# Patient Record
Sex: Female | Born: 1935
Health system: Southern US, Community
[De-identification: ages and names within clinical notes are randomized; demographics above are authoritative.]

## PROBLEM LIST (undated history)

## (undated) DIAGNOSIS — D649 Anemia, unspecified: Secondary | ICD-10-CM

## (undated) DIAGNOSIS — I480 Paroxysmal atrial fibrillation: Secondary | ICD-10-CM

## (undated) DIAGNOSIS — Z8673 Personal history of transient ischemic attack (TIA), and cerebral infarction without residual deficits: Secondary | ICD-10-CM

## (undated) DIAGNOSIS — I5032 Chronic diastolic (congestive) heart failure: Secondary | ICD-10-CM

## (undated) DIAGNOSIS — I639 Cerebral infarction, unspecified: Secondary | ICD-10-CM

## (undated) DIAGNOSIS — E119 Type 2 diabetes mellitus without complications: Secondary | ICD-10-CM

## (undated) DIAGNOSIS — K649 Unspecified hemorrhoids: Secondary | ICD-10-CM

## (undated) DIAGNOSIS — I509 Heart failure, unspecified: Secondary | ICD-10-CM

## (undated) DIAGNOSIS — N183 Chronic kidney disease, stage 3 unspecified: Secondary | ICD-10-CM

## (undated) DIAGNOSIS — I10 Essential (primary) hypertension: Secondary | ICD-10-CM

## (undated) DIAGNOSIS — K219 Gastro-esophageal reflux disease without esophagitis: Secondary | ICD-10-CM

## (undated) DIAGNOSIS — M1990 Unspecified osteoarthritis, unspecified site: Secondary | ICD-10-CM

## (undated) DIAGNOSIS — I619 Nontraumatic intracerebral hemorrhage, unspecified: Secondary | ICD-10-CM

## (undated) DIAGNOSIS — N184 Chronic kidney disease, stage 4 (severe): Secondary | ICD-10-CM

## (undated) HISTORY — PX: JOINT REPLACEMENT: SHX530

## (undated) HISTORY — PX: EYE SURGERY: SHX253

## (undated) HISTORY — DX: Chronic diastolic (congestive) heart failure: I50.32

## (undated) HISTORY — DX: Cerebral infarction, unspecified: I63.9

## (undated) HISTORY — PX: BACK SURGERY: SHX140

## (undated) HISTORY — PX: CHOLECYSTECTOMY: SHX55

## (undated) HISTORY — DX: Unspecified hemorrhoids: K64.9

---

## 2001-03-04 ENCOUNTER — Emergency Department (HOSPITAL_COMMUNITY): Admission: EM | Admit: 2001-03-04 | Discharge: 2001-03-04 | Payer: Self-pay | Admitting: *Deleted

## 2001-03-28 ENCOUNTER — Emergency Department (HOSPITAL_COMMUNITY): Admission: EM | Admit: 2001-03-28 | Discharge: 2001-03-28 | Payer: Self-pay | Admitting: Emergency Medicine

## 2001-03-28 ENCOUNTER — Encounter: Payer: Self-pay | Admitting: Emergency Medicine

## 2001-06-30 ENCOUNTER — Ambulatory Visit (HOSPITAL_COMMUNITY): Admission: RE | Admit: 2001-06-30 | Discharge: 2001-06-30 | Payer: Self-pay | Admitting: Family Medicine

## 2001-06-30 ENCOUNTER — Encounter: Payer: Self-pay | Admitting: Family Medicine

## 2001-07-07 ENCOUNTER — Emergency Department (HOSPITAL_COMMUNITY): Admission: EM | Admit: 2001-07-07 | Discharge: 2001-07-07 | Payer: Self-pay | Admitting: Emergency Medicine

## 2001-07-07 ENCOUNTER — Encounter: Payer: Self-pay | Admitting: Emergency Medicine

## 2001-07-09 ENCOUNTER — Encounter: Payer: Self-pay | Admitting: Internal Medicine

## 2001-07-09 ENCOUNTER — Inpatient Hospital Stay (HOSPITAL_COMMUNITY): Admission: EM | Admit: 2001-07-09 | Discharge: 2001-07-13 | Payer: Self-pay | Admitting: Emergency Medicine

## 2001-07-09 ENCOUNTER — Encounter: Payer: Self-pay | Admitting: Emergency Medicine

## 2001-07-13 ENCOUNTER — Inpatient Hospital Stay (HOSPITAL_COMMUNITY)
Admission: RE | Admit: 2001-07-13 | Discharge: 2001-07-21 | Payer: Self-pay | Admitting: Physical Medicine & Rehabilitation

## 2001-07-27 ENCOUNTER — Encounter (HOSPITAL_COMMUNITY)
Admission: RE | Admit: 2001-07-27 | Discharge: 2001-08-26 | Payer: Self-pay | Admitting: Physical Medicine & Rehabilitation

## 2001-08-30 ENCOUNTER — Encounter (HOSPITAL_COMMUNITY)
Admission: RE | Admit: 2001-08-30 | Discharge: 2001-09-29 | Payer: Self-pay | Admitting: Physical Medicine & Rehabilitation

## 2001-09-22 ENCOUNTER — Inpatient Hospital Stay (HOSPITAL_COMMUNITY): Admission: EM | Admit: 2001-09-22 | Discharge: 2001-09-24 | Payer: Self-pay | Admitting: Internal Medicine

## 2001-09-22 ENCOUNTER — Encounter: Payer: Self-pay | Admitting: Internal Medicine

## 2001-12-28 ENCOUNTER — Inpatient Hospital Stay (HOSPITAL_COMMUNITY): Admission: RE | Admit: 2001-12-28 | Discharge: 2001-12-29 | Payer: Self-pay | Admitting: Internal Medicine

## 2001-12-28 HISTORY — PX: ESOPHAGOGASTRODUODENOSCOPY: SHX1529

## 2002-04-13 ENCOUNTER — Emergency Department (HOSPITAL_COMMUNITY): Admission: EM | Admit: 2002-04-13 | Discharge: 2002-04-13 | Payer: Self-pay | Admitting: Emergency Medicine

## 2002-04-13 ENCOUNTER — Encounter: Payer: Self-pay | Admitting: *Deleted

## 2002-06-30 ENCOUNTER — Ambulatory Visit (HOSPITAL_COMMUNITY): Admission: RE | Admit: 2002-06-30 | Discharge: 2002-06-30 | Payer: Self-pay | Admitting: Internal Medicine

## 2002-06-30 ENCOUNTER — Encounter (INDEPENDENT_AMBULATORY_CARE_PROVIDER_SITE_OTHER): Payer: Self-pay | Admitting: Internal Medicine

## 2002-07-06 ENCOUNTER — Ambulatory Visit (HOSPITAL_COMMUNITY): Admission: RE | Admit: 2002-07-06 | Discharge: 2002-07-06 | Payer: Self-pay | Admitting: Internal Medicine

## 2002-07-06 ENCOUNTER — Encounter (INDEPENDENT_AMBULATORY_CARE_PROVIDER_SITE_OTHER): Payer: Self-pay | Admitting: Internal Medicine

## 2002-09-15 ENCOUNTER — Ambulatory Visit (HOSPITAL_COMMUNITY): Admission: RE | Admit: 2002-09-15 | Discharge: 2002-09-15 | Payer: Self-pay | Admitting: Internal Medicine

## 2002-09-15 ENCOUNTER — Encounter: Payer: Self-pay | Admitting: Internal Medicine

## 2002-11-01 ENCOUNTER — Emergency Department (HOSPITAL_COMMUNITY): Admission: EM | Admit: 2002-11-01 | Discharge: 2002-11-01 | Payer: Self-pay | Admitting: Emergency Medicine

## 2003-02-17 ENCOUNTER — Emergency Department (HOSPITAL_COMMUNITY): Admission: EM | Admit: 2003-02-17 | Discharge: 2003-02-17 | Payer: Self-pay | Admitting: Emergency Medicine

## 2003-02-17 ENCOUNTER — Encounter: Payer: Self-pay | Admitting: Emergency Medicine

## 2003-12-12 ENCOUNTER — Ambulatory Visit (HOSPITAL_COMMUNITY): Admission: RE | Admit: 2003-12-12 | Discharge: 2003-12-12 | Payer: Self-pay | Admitting: Internal Medicine

## 2003-12-12 HISTORY — PX: COLONOSCOPY: SHX174

## 2004-01-29 ENCOUNTER — Emergency Department (HOSPITAL_COMMUNITY): Admission: EM | Admit: 2004-01-29 | Discharge: 2004-01-29 | Payer: Self-pay | Admitting: Emergency Medicine

## 2004-08-12 ENCOUNTER — Ambulatory Visit (HOSPITAL_COMMUNITY): Admission: RE | Admit: 2004-08-12 | Discharge: 2004-08-12 | Payer: Self-pay | Admitting: Internal Medicine

## 2005-07-07 ENCOUNTER — Ambulatory Visit: Payer: Self-pay | Admitting: Internal Medicine

## 2005-10-04 ENCOUNTER — Emergency Department (HOSPITAL_COMMUNITY): Admission: EM | Admit: 2005-10-04 | Discharge: 2005-10-04 | Payer: Self-pay | Admitting: Emergency Medicine

## 2006-01-12 ENCOUNTER — Emergency Department (HOSPITAL_COMMUNITY): Admission: EM | Admit: 2006-01-12 | Discharge: 2006-01-12 | Payer: Self-pay | Admitting: Emergency Medicine

## 2006-01-15 ENCOUNTER — Ambulatory Visit (HOSPITAL_COMMUNITY): Admission: RE | Admit: 2006-01-15 | Discharge: 2006-01-15 | Payer: Self-pay | Admitting: Internal Medicine

## 2006-05-18 HISTORY — PX: HIP FRACTURE SURGERY: SHX118

## 2006-06-23 ENCOUNTER — Emergency Department (HOSPITAL_COMMUNITY): Admission: EM | Admit: 2006-06-23 | Discharge: 2006-06-23 | Payer: Self-pay | Admitting: Emergency Medicine

## 2006-06-30 ENCOUNTER — Ambulatory Visit: Payer: Self-pay | Admitting: Gastroenterology

## 2007-03-11 ENCOUNTER — Ambulatory Visit: Payer: Self-pay | Admitting: Orthopedic Surgery

## 2007-03-11 ENCOUNTER — Inpatient Hospital Stay (HOSPITAL_COMMUNITY): Admission: EM | Admit: 2007-03-11 | Discharge: 2007-03-17 | Payer: Self-pay | Admitting: Emergency Medicine

## 2007-03-12 ENCOUNTER — Encounter: Payer: Self-pay | Admitting: Orthopedic Surgery

## 2007-03-17 ENCOUNTER — Encounter: Payer: Self-pay | Admitting: Orthopedic Surgery

## 2007-04-13 ENCOUNTER — Ambulatory Visit: Payer: Self-pay | Admitting: Orthopedic Surgery

## 2007-04-13 DIAGNOSIS — E119 Type 2 diabetes mellitus without complications: Secondary | ICD-10-CM | POA: Insufficient documentation

## 2007-04-13 DIAGNOSIS — S72143A Displaced intertrochanteric fracture of unspecified femur, initial encounter for closed fracture: Secondary | ICD-10-CM | POA: Insufficient documentation

## 2007-06-03 ENCOUNTER — Encounter: Payer: Self-pay | Admitting: Orthopedic Surgery

## 2007-06-16 ENCOUNTER — Ambulatory Visit: Payer: Self-pay | Admitting: Orthopedic Surgery

## 2007-06-20 ENCOUNTER — Encounter: Payer: Self-pay | Admitting: Orthopedic Surgery

## 2007-06-21 ENCOUNTER — Encounter: Payer: Self-pay | Admitting: Orthopedic Surgery

## 2007-06-21 ENCOUNTER — Encounter (HOSPITAL_COMMUNITY): Admission: RE | Admit: 2007-06-21 | Discharge: 2007-07-21 | Payer: Self-pay | Admitting: Orthopedic Surgery

## 2007-07-20 ENCOUNTER — Encounter: Payer: Self-pay | Admitting: Orthopedic Surgery

## 2007-07-25 ENCOUNTER — Ambulatory Visit: Payer: Self-pay | Admitting: Orthopedic Surgery

## 2007-07-26 ENCOUNTER — Encounter (HOSPITAL_COMMUNITY): Admission: RE | Admit: 2007-07-26 | Discharge: 2007-08-25 | Payer: Self-pay | Admitting: Orthopedic Surgery

## 2007-08-23 ENCOUNTER — Encounter: Payer: Self-pay | Admitting: Orthopedic Surgery

## 2008-05-02 ENCOUNTER — Emergency Department (HOSPITAL_COMMUNITY): Admission: EM | Admit: 2008-05-02 | Discharge: 2008-05-02 | Payer: Self-pay | Admitting: Cardiovascular Disease

## 2008-11-23 ENCOUNTER — Encounter: Payer: Self-pay | Admitting: Orthopedic Surgery

## 2008-11-23 ENCOUNTER — Ambulatory Visit (HOSPITAL_COMMUNITY): Admission: RE | Admit: 2008-11-23 | Discharge: 2008-11-23 | Payer: Self-pay | Admitting: Internal Medicine

## 2008-12-13 ENCOUNTER — Ambulatory Visit: Payer: Self-pay | Admitting: Orthopedic Surgery

## 2008-12-13 DIAGNOSIS — M549 Dorsalgia, unspecified: Secondary | ICD-10-CM | POA: Insufficient documentation

## 2008-12-13 DIAGNOSIS — M5126 Other intervertebral disc displacement, lumbar region: Secondary | ICD-10-CM | POA: Insufficient documentation

## 2008-12-13 DIAGNOSIS — IMO0002 Reserved for concepts with insufficient information to code with codable children: Secondary | ICD-10-CM | POA: Insufficient documentation

## 2008-12-13 DIAGNOSIS — M1710 Unilateral primary osteoarthritis, unspecified knee: Secondary | ICD-10-CM | POA: Insufficient documentation

## 2008-12-14 ENCOUNTER — Telehealth: Payer: Self-pay | Admitting: Orthopedic Surgery

## 2008-12-18 ENCOUNTER — Ambulatory Visit (HOSPITAL_COMMUNITY): Admission: RE | Admit: 2008-12-18 | Discharge: 2008-12-18 | Payer: Self-pay | Admitting: Orthopedic Surgery

## 2008-12-26 ENCOUNTER — Telehealth: Payer: Self-pay | Admitting: Orthopedic Surgery

## 2008-12-31 ENCOUNTER — Ambulatory Visit: Payer: Self-pay | Admitting: Orthopedic Surgery

## 2009-01-01 ENCOUNTER — Encounter (INDEPENDENT_AMBULATORY_CARE_PROVIDER_SITE_OTHER): Payer: Self-pay | Admitting: *Deleted

## 2009-01-03 ENCOUNTER — Encounter: Admission: RE | Admit: 2009-01-03 | Discharge: 2009-01-03 | Payer: Self-pay | Admitting: Orthopedic Surgery

## 2009-01-24 ENCOUNTER — Encounter: Admission: RE | Admit: 2009-01-24 | Discharge: 2009-01-24 | Payer: Self-pay | Admitting: Orthopedic Surgery

## 2009-03-14 ENCOUNTER — Ambulatory Visit (HOSPITAL_COMMUNITY): Admission: RE | Admit: 2009-03-14 | Discharge: 2009-03-14 | Payer: Self-pay | Admitting: Internal Medicine

## 2009-03-19 ENCOUNTER — Ambulatory Visit (HOSPITAL_COMMUNITY): Admission: RE | Admit: 2009-03-19 | Discharge: 2009-03-19 | Payer: Self-pay | Admitting: Cardiology

## 2009-09-03 ENCOUNTER — Emergency Department (HOSPITAL_COMMUNITY): Admission: EM | Admit: 2009-09-03 | Discharge: 2009-09-03 | Payer: Self-pay | Admitting: Emergency Medicine

## 2009-09-20 ENCOUNTER — Ambulatory Visit (HOSPITAL_COMMUNITY): Admission: RE | Admit: 2009-09-20 | Discharge: 2009-09-20 | Payer: Self-pay | Admitting: Internal Medicine

## 2010-06-09 ENCOUNTER — Encounter: Payer: Self-pay | Admitting: Orthopedic Surgery

## 2010-08-05 LAB — COMPREHENSIVE METABOLIC PANEL
ALT: 11 U/L (ref 0–35)
Albumin: 3.4 g/dL — ABNORMAL LOW (ref 3.5–5.2)
Alkaline Phosphatase: 53 U/L (ref 39–117)
BUN: 23 mg/dL (ref 6–23)
Calcium: 9.5 mg/dL (ref 8.4–10.5)
Potassium: 3.9 mEq/L (ref 3.5–5.1)
Sodium: 139 mEq/L (ref 135–145)
Total Protein: 7.4 g/dL (ref 6.0–8.3)

## 2010-08-05 LAB — DIFFERENTIAL
Basophils Relative: 0 % (ref 0–1)
Lymphs Abs: 1.4 10*3/uL (ref 0.7–4.0)
Monocytes Absolute: 0.5 10*3/uL (ref 0.1–1.0)
Monocytes Relative: 5 % (ref 3–12)
Neutro Abs: 8.7 10*3/uL — ABNORMAL HIGH (ref 1.7–7.7)
Neutrophils Relative %: 80 % — ABNORMAL HIGH (ref 43–77)

## 2010-08-05 LAB — CBC
MCHC: 34.7 g/dL (ref 30.0–36.0)
Platelets: 271 10*3/uL (ref 150–400)
RDW: 15.1 % (ref 11.5–15.5)

## 2010-08-05 LAB — POCT CARDIAC MARKERS: Troponin i, poc: <0.05 ng/mL (ref 0.00–0.09)

## 2010-09-30 NOTE — Discharge Summary (Signed)
[name], [name]                  ACCOUNT NO.:  [id number]   MEDICAL RECORD NO.:  [id number]           PATIENT TYPE:  INP   LOCATION:  A302                          FACILITY:  [facility]   PHYSICIAN:  Carole Civil, M.D.DATE OF BIRTH:  20-Nov-1934   DATE OF ADMISSION:  03/11/2007  DATE OF DISCHARGE:  10/30/2008LH                               DISCHARGE SUMMARY   ADMISSION DIAGNOSIS:  Fractured right hip.   DISCHARGE DIAGNOSIS:  Same.   ADMITTING PHYSICIAN:  Dr. Aline Brochure.   DISCHARGING PHYSICIAN:  Dr. Aline Brochure.   OPERATIVE PROCEDURE:  On March 12, 2007, this patient underwent open  treatment, internal fixation with an Omega dynamic hip screw, had a 300  mL blood loss.   OPERATING PHYSICIAN:  Dr. Marlowe Kays.   DATE OF SURGERY:  03/12/2007.   The patient tolerated the procedure well.   HISTORY:  This is a 76 year old female.  She has diabetes.  She has had  a CVA with right-sided residual weakness.  She has a history of peptic  ulcer disease, coronary artery disease, diabetes, status post  cholecystectomy, lumbar diskectomy.  She does not smoke or drink.  She  ambulates in the community with a walker and in the house with a cane.  On the date of admission, she fell and fractured her right hip.  She had  a basicervical intertrochanteric fracture of the right hip.  She was  admitted to the emergency room by EMS and then to the hospital for  definitive care.   POSTOP COURSE:  This was marked by drainage from her right hip wound.  This was controlled by stopping her Lovenox.  Her hemoglobin dropped  down to 8 on the March 15, 2007.  She was transfused 2 units of blood.  On March 17, 2007, hemoglobin was 9.7.  Drainage had resolved.  Wound  was clean, dry, and intact.   DISCHARGE INSTRUCTIONS:  She is partial weightbearing on the right hip  for a period of 6 weeks.  She should have TEDs hose knee high to both  legs.  She should have physical therapy daily to assist in  gait  training.   DISCHARGE MEDICATIONS:  1. Glyburide 5 mg b.i.d. w.c.  2. Hydrochlorothiazide 25 mg once a day.  3. Glucophage 500 mg twice a day.  4. Multivitamin 1 daily.  5. Protonix 40 mg q. day.  6. Actos 45 mg daily.  7. Diovan 320 mg daily.  8. Vicodin 1 q.4 p.r.n. for pain.  9. Robaxin 500 mg q.6 p.r.n. for pain.  10.Senokot 2 tablets daily.  11.Colace 100 mg b.i.d.  12.Iron 325 mg twice a day.   ALLERGIES:  SHE HAS NO KNOWN DRUG ALLERGIES.   FOLLOWUP:  In 1 month at Dr. Ruthe Mannan office for x-rays.  Phone number  there is [phone].  [facility]ples should come out on March 21, 2007.      Carole Civil, M.D.  Electronically Signed     SEH/MEDQ  D:  03/17/2007  T:  03/17/2007  Job:  [id number]   cc:   Forestine Na  3rd floor

## 2010-09-30 NOTE — H&P (Signed)
[name], [name] NO.:  [id number]   MEDICAL RECORD NO.:  [id number]          PATIENT TYPE:  EMS   LOCATION:  ED                            FACILITY:  [facility]   PHYSICIAN:  Carole Civil, MclaughlinDATE OF BIRTH:  Jun 12, 1934   DATE OF ADMISSION:  03/11/2007  DATE OF DISCHARGE:  LH                              HISTORY & PHYSICAL   CHIEF COMPLAINT:  Right hip pain.   HISTORY OF PRESENT ILLNESS:  This is a 76 year old female with diabetes,  status post CVA with right-sided residual weakness, history of peptic  ulcer disease, coronary artery disease, diabetes, status post  cholecystectomy, history of lumbar diskectomy, who is a nonsmoker,  nondrinker.  Ambulates in the community with a walker and in the house  with a cane and fell today and fractured her right hip.  She has a base  cervical intertrochanteric type right hip fracture.  She was admitted to  the emergency room by EMS.  [name]. [name], M.D. has asked for  orthopedic evaluation.  The patient is a patient of [name],  M.D.   REVIEW OF SYSTEMS:  She denies any problems except she was having some  radicular pain in her right thigh prior to this fall that has been  present for several weeks to a couple of months now.   ALLERGIES:  No known drug allergies.   MEDICATIONS:  1. Diovan 320/25 mg once a day.  2. Actos 45 mg once a day.  3. Prevacid 30 mg once a day.  4. Aspirin 81 mg a day.  5. Calcium.  6. Meclizine 25 mg as needed.  7. Lantus subcu 10 units at bedtime.  8. Pamine Forte 5 mg p.r.n.  9. Glyburide 5/500 mg two tablets twice daily.   PHYSICAL EXAMINATION:  VITAL SIGNS:  Blood pressure 160/68, pulse 78,  respiratory rate 20, temperature 98, O2 saturations 100%.  APPEARANCE:  She appears younger than her stated age.  There is no spine  deformity, abnormality, tenderness, or lymphadenopathy.  CHEST:  Clear.  HEART:  Regular rate and rhythm.  ABDOMEN:  Soft.  EXTREMITIES:  Right  lower extremity is in external rotation, I did not  move it.  It had good color, capillary refill, muscle tone, etc.  The  left lower extremity and two upper extremities had normal range of  motion, strength, stability, and alignment barring some mild weakness in  the right upper extremity.   X-rays show the fracture, as I stated, base cervical intertrochanteric  type fracture will probably require dynamic hip screw/screw-in  sideplate.   I will be not covering the hospital this weekend.  Locums tenems is  coming in to cover for me.  I have alerted the patient and her daughter  of this.  They would like to stay here and have the surgery here and  have me follow them after the surgery.  I am agreeable and they are  agreeable too.  We will get a medical consult and proceed with surgery  which will be scheduled by [name],  M.D.      Carole Civil, M.D.  Electronically Signed     SEH/MEDQ  D:  03/11/2007  T:  03/11/2007  Job:  [id number]

## 2010-09-30 NOTE — Op Note (Signed)
[name], [name]                  ACCOUNT NO.:  [id number]   MEDICAL RECORD NO.:  [id number]          PATIENT TYPE:  INP   LOCATION:  A302                          FACILITY:  [facility]   PHYSICIAN:  Geryl Rankins, MD DATE OF BIRTH:  26-Nov-1934   DATE OF PROCEDURE:  03/12/2007  DATE OF DISCHARGE:                               OPERATIVE REPORT   PREOPERATIVE DIAGNOSIS:  Intertrochanteric fracture of the right hip,  displaced.   POSTOPERATIVE DIAGNOSIS:  Intertrochanteric fracture of the right hip,  displaced.   OPERATIVE PROCEDURE:  Open reduction internal fixation with a 100  millimeter Omega sliding hip screw and a 135 four-hole side plate.   BLOOD LOSS:  Approximately 300 mL.   The patient was taken to the operating room under spinal anesthetic, was  prepped and draped in the usual manner using vertical draping.  A  lateral incision was made.  The greater trochanter was identified at the  flare and also the lesser trochanter palpated and a drill hole was made  at that level for pin insertion.  When the pin was adequately in the  middle of the femoral neck on both AP and lateral, it was driven into  the subchondral bone of the femoral head and the length was then  measured with a depth gauge.  Over the guide pin the reamer was taken up  to the subchondral bone and then the lateral cortical reamer was also  used laterally.  Following this, a 100 mm hip screw was inserted in the  usual manner by hand and the side plate affixed without difficulty, the  barrel to the screw.  Following this, a Lowman clamp was then used to  affix the plate to the femoral shaft and four screws were inserted after  drilling, depth gauging and inserting screws.  After this, they were  hand tightened.  The wound was copiously irrigated.  Final C-arm  radiographs were taken and the wound was closed with interrupted #0  Vicryl on the vastus lateralis, a continuous #0 Vicryl on the fascia  lata, #2-0  Vicryl on the subcu and staples on the skin.  A sterile  dressing was applied.  The traction was released.  Patient taken from  the operating table, placed on a bed and left the operating room in  satisfactory condition.      Geryl Rankins, MD     RLK/MEDQ  D:  03/12/2007  T:  03/13/2007  Job:  [id number]

## 2010-10-03 NOTE — H&P (Signed)
NAME:  [name], [name] NO.:  [id number]   MEDICAL RECORD NO.:  [id number]                  PATIENT TYPE:   LOCATION:                                       FACILITY:   PHYSICIAN:  Hildred Laser, M.D.                 DATE OF BIRTH:  [birth year]   DATE OF ADMISSION:  DATE OF DISCHARGE:                                HISTORY & PHYSICAL   CHIEF COMPLAINT:  Follow-up for history of abdominal pain and  gastrointestinal bleed.   HISTORY OF PRESENT ILLNESS:  The patient is a 76 year old African-American  female who was last seen in this clinic on March 14, 2002.  The patient  had a GI bleed secondary to a bulbar ulcer.  The patient reports that she  has been relatively pain free and that her symptoms of GERD are controlled  on Prevacid 30 mg a day.  The patient is on an indigent patient medication  program.  The patient denies hematemesis or swallowing difficulty.  As far  as the patient's bowel movements, she does relate that she says constipated.  She denies melena or hematochezia.  The patient further states that she has  had no symptoms of nausea, vomiting, diarrhea or weight loss.   CURRENT MEDICATIONS:  1. Aspirin 81 mg daily.  2. Prevacid 30 mg one daily.  3. Diovan HCT one daily.  4. Actos 15 mg one daily.   PHYSICAL EXAMINATION:  VITAL SIGNS:  Weight 174 1/2, blood pressure 130/82,  pulse 84.  HEENT:  Normocephalic, atraumatic.  The sclerae are clear and non-icteric.  The conjunctivae are pink.  The oropharynx is pink, moist and without  lesions.  EXTREMITIES: Without cyanosis, clubbing or edema.  CARDIOVASCULAR:  Regular rate and rhythm with no murmurs, rubs or gallops.  RESPIRATORY:  Clear to auscultation bilaterally.  ABDOMEN:  Soft and slightly distended.  There is no hepatosplenomegaly.  Masses are absent.  Bowel sounds are present in all four quarters.  RECTAL EXAM:  No lesions.  The patient has one external hemorrhoid visible.  Hemoccult was negative.   IMPRESSION:  This is a 76 year old African-American female with a history of  a gastrointestinal bleed and a history of abdominal pain who reports no  symptoms of gastroesophageal reflux disease.  The patient denies melena or  hematochezia.  She does report problems with constipation.  The patient has  had esophagogastroduodenoscopies in the past for treatment of her  gastroesophageal reflux disease and has a history of a gastrointestinal  bleed on December 28, 2001, but appears to be pain free at this time.  The  patient has never had a screening colonoscopy and at age 76 she is past due  for this procedure. We will recommend that she have one in the near future.   RECOMMENDATIONS:  1. The patient was given samples of fiber and recommended that she take at  least one of these a day to attempt to regulate her constipation.  2. The patient will be scheduled for a TCF with Dr. Derryl Harbor.  3. The patient had laboratory studies done recently at Dr. Josephine Cables office,     she states not more than one month, and an effort will be made to secure     these records.   Thank you for the opportunity to participate in this patient's care.     _____________________________________  ___________________________________________  Dellis Anes, PA                        Hildred Laser, M.D.   GC/MEDQ  D:  11/16/2003  T:  11/16/2003  Job:  [id number]   cc:   Tesfaye D. Legrand Rams, M.D.  [address]  [place]  [place] [address]  Fax: [place], M.D.  [address]  [place] [address]  Fax: [phone]

## 2010-10-03 NOTE — Discharge Summary (Signed)
[place]  Patient:    [name], [name] Visit Number: [id number] MRN: [id number]          Service Type: [facility] Location: [id number] Attending Physician:  Gilmore Laroche Dictated by:   Cyndi Lennert Admit Date:  07/13/2001 Disc. Date: 07/13/01   CC:         [place] Department   Discharge Summary  DISCHARGE DIAGNOSES: 1. Acute cerebrovascular accident of the left pons. 2. Diffuse atherosclerotic disease of the posterior cerebrovascular    circulation including _____ basilar artery and irregularities of    both posterior cerebral arteries. 3. Hypercholesterolemia, LDL of 103 in setting of known cerebrovascular    disease. 4. Diabetes, type 2, hemoglobin A1c of 9.8 on current admission. 5. Hypertension.  DISCHARGE MEDICATIONS: 1. Aspirin 325 mg q.d. 2. Lotensin 20 mg q.d. 3. Glucotrol 5 mg q.d. 4. Metformin 1 g b.i.d. 5. Zocor 20 mg q.d.  DISPOSITION:  To inpatient rehabilitation unit for intensive rehabilitation after her stroke.  On an outpatient basis, [name] with follow up with the [facility].  One issue at her followup visit will be to recheck liver enzymes after a Statin drug has been started.  Her baseline liver enzymes are bilirubin 0.6, alkaline phosphatase 51, AST 19, ALT 17, total protein 6.7, albumin 3.2.  PROCEDURES:  Echocardiogram shows an ejection fraction of 55-65% with mild concentric hypertrophy of the left ventricular chamber.  HISTORY OF PRESENT ILLNESS:  Ms. [name] is a 76 year old African American female with diabetes and hypertension who complains of progressively worsening weakness in her right upper extremity and lower extremities over the two days prior to admission.  She has also had a hard time getting her words out when she speaks.  She was seen at another hospital two days prior to admission complaining of dizziness, but did not have her musculoskeletal problems at that time  and was sent home.  Her dizziness progressed to said weakness.  She said she had one previous episode similar to this last summer that resolved on its own.  MEDICATIONS ON ADMISSION: 1. Glucovance. 2. Lotensin.  ALLERGIES:  No known drug allergies.  SOCIAL HISTORY:  Denies tobacco, alcohol, or drugs.  She currently lives with her son.  FAMILY HISTORY:  Her brother died of an MI in his 53s.  PHYSICAL EXAMINATION:  VITAL SIGNS:  Temperature 97.8, blood pressure 172/90, pulse 78, respirations 18, saturating 99% on room air.  HEENT:  Normocephalic, atraumatic.  Pupils are equal, round, and reactive to light and accommodation.  Extraocular movements intact.  Oropharynx and nasopharynx without lesions.  NECK:  Supple without JVD or lymphadenopathy.  CHEST:  Clear to auscultation bilaterally.  CARDIOVASCULAR:  Regular rate and rhythm, no murmurs, rubs, or gallops.  ABDOMEN:  Soft, nontender, nondistended.  Normoactive bowel sounds.  EXTREMITIES:  No edema, cyanosis or clubbing.  NEUROLOGIC:  Alert and oriented x3, no confusion.  Cranial nerves II-XII are intact, except for a mild right facial droop.  Toes are upgoing on the left side and downgoing on the right side.  Deep tendon reflexes are 2+ bilaterally.  No motor or sensory deficits noted.  Her gait involves dragging the right foot, but she is able to move around with assistance.  LABORATORIES:  On admission, sodium 138, potassium 3.4, chloride 106, CO2 26, BUN 13, creatinine 1.0, glucose 239, calcium 9.0.  WBC 7.4, hemoglobin 12.4, MCV 89, platelets 236, PT 13.3, INR 1.0, PTT 32.  EKG shows  a sinus arrhythmia with rate of 73.  No ischemic changes.  CT of the head shows no bleed or stroke.  Later, an MRI confirmed the diagnosis of acute CVA.  HOSPITAL COURSE: #1 - ACUTE CVA WITH LEFT PONS:  Although [name] did not show any changes on her CT of the head, her physical examination was very much consistent with acute CVA  and so an MRI was performed.  The MRI showed stroke in the left pons region.  The MRA also showed diffuse atherosclerotic disease of the posterior circulation of the cerebral vasculature.  [name] weakness worsened on the second day of hospitalization, but gradually improved after that.  She was started on aspirin at admission.  As her symptoms had first started occurring two days prior to admission, the use of TPA was never considered.  She will continue to get rehabilitation in the hospital in the rehabilitation unit. Based on the echocardiogram that did not detect a thrombotic event within her heart, the normal EKG and no known history of atrial fibrillation, and the normal MRA of her carotid arteries, it is assumed that [name] stroke was due to a localized thrombotic event, due to her diabetes and hypertension.  #2 - HYPERCHOLESTEROLEMIA:  [name] lipid profile showed a total cholesterol of 168, triglycerides 131, HDL 39, LDL 103.  She was not known to be hypercholesterolemic before this event.  She was started on low dose Statin drug based on her diabetes and her known cerebrovascular disease with a target LDL less than 100.  Her lipid profile should be rechecked in six months to show that this dosage is working for her.  Also, her liver enzymes should be checked in 1-2 months from now to assure that the Statin drug is not causing liver damage.  #3 - DIABETES, TYPE 2, WITH HEMOGLOBIN A1c OF 9.8 AT ADMISSION:  [name] metformin dosage was increased from 500 b.i.d. to 1 g b.i.d. in order to get better control of her sugars.  This medicine dosage was raised instead of the Glucotrol as it causes less hypoglycemic episodes.  She has now reached the maximum dosage of metformin and if she continues she will have high sugars. Her dose of Glucotrol may need to be increased in the future.  #4 - HYPERTENSION:  [name] blood pressure was labile during her stay,  but fairly  well controlled on just Lotensin.  DISCHARGE LABORATORIES:  Sodium 139, potassium 4.1, chloride 106, CO2 25, glucose 222, BUN 10, creatinine 0.7, calcium 9.0.  WBC 7.4, hemoglobin 12.3, MCV 87, platelets 222. Dictated by:   Cyndi Lennert Attending Physician:  Gilmore Laroche DD:  07/13/01 TD:  07/13/01 Job: [id number]

## 2010-10-03 NOTE — H&P (Signed)
[facility]  Patient:    [name], [name] Visit Number: [id number] MRN: [id number]          Service Type: MED Location: 2A A218 01 Attending Physician:  Glo Herring. Dictated by:   Farley Ly, M.D. Admit Date:  09/22/2001                           History and Physical  HISTORY OF PRESENT ILLNESS:  The patient is a 76 year old female who presented to the emergency room complaining of one day of difficulty with her vision and weakness.  The patient was subsequently found to have hemoglobin of 8 and heme-positive stools, and she is being admitted for further evaluation.  The patient states that she has had some blurred and double vision over the past one day.  She denies any headache.  She denies any focal weakness.  She does state that she had a CVA in February of this year and has some residual right-sided weakness, but this has been unchanged.  The patient has no history of peptic ulcer disease or any previous GI bleed.  She denies any nausea, vomiting, or abdominal pain, and she denies noting any dark stools.  She does take enteric-coated aspirin at 325 mg q.d.  PAST MEDICAL HISTORY: 1. Hypertension. 2. Diabetes. 3. CVA in February 2003, with residual right-sided weakness. 4. Degenerative joint disease. 5. Status post cholecystectomy. 6. Back surgery.  On workup during her February hospitalization at [facility], an MRI/MRA showed an acute infarct in the left pons with extensive small-vessel disease in the pons and deep subcortical matter.  Posterior circumflex, and basilar artery were irregular and narrow.  An echocardiogram showed an EF of 55-65%.  At that time the patient was discharged on aspirin, Lotensin at 20 mg q.d., Glucotrol XL 5 b.i.d., Glucophage 1000 a.c. breakfast and supper and 500 a.c. lunch, Lipitor at 50 a day, and Microzide at 12.5 q.d.  The patient states that she is uncertain regarding exact names and dosages of her  medicines.  She does believe that the Glucophage has been discontinued, and her GlucoVance which she is taking now has been increased.  The patient has subsequently undergone both inpatient and outpatient rehabilitation from her CVA.  ALLERGIES:  None.  SOCIAL HISTORY:  The patient lives with her grandson.  She denies any tobacco use, and she denies alcohol use.  She is retired.  She formerly worked for [employer] and has worked for [employer].  The patient is relatively sedentary.  She does dress herself but does minimal other activities.  FAMILY HISTORY:  Positive for hypertension in her mother.  REVIEW OF SYSTEMS:  No headaches.  Visual changes as per HPI.  No dysphagia or odynophagia.  The patient has some sporadic episodes of chest pain, although she is very unclear in her description of this.  It does not appear to be exertional and is not associated with shortness of breath, nausea, vomiting, or diaphoresis.  She denies any nausea, vomiting, or abdominal pain. Apparently, she did have a urinary tract infection treated at [facility].  States that she does note that her urine is strong in color but denies dysuria or hematuria.  She denies any leg pain or swelling.  She has residual right-sided weakness since her stroke in February and states that this is currently unchanged.  PHYSICAL EXAMINATION:  GENERAL:  Well-developed, well-nourished female in no acute distress.  VITAL SIGNS:  Temperature  97.2, pulse 97, respirations 18, blood pressure 127/51, O2 saturation 98%.  HEENT:  Pupils are pinpoint, minimally reactive.  Sclerae anicteric. Conjunctivae not injected.  Extraocular movements appear to be intact.  Visual acuity appears to be intact at a short distance.  Oropharynx:  The patient wears dentures.  There are no oral lesions noted.  Tongue is midline.  NECK:  Supple.  No bruits are heard.  No adenopathy is noted.  CHEST, LUNGS:  Clear.  CARDIAC:  Rhythm is  regular.  BREASTS:  Deferred.  ABDOMEN:  Soft.  Bowel sounds are present in all four quadrants.  No rebound, guarding, or obvious organomegaly are noted.  GENITOURINARY:  Dark heme-positive stool per ER staff.  EXTREMITIES:  Distal pulses are intact.  Calves are nontender and without warmth, erythema, or palpable cords noted.  NEUROLOGIC:  Cranial nerves II-XII are grossly intact.  Motor is 4/5 on the right and 5/5 on the left.  Sensation appears to be intact to light touch. Babinski is downgoing bilaterally.  LABORATORY DATA:  A head CT shows a hypodense lesion in the left pons.  CK 142, MB 3.6, troponin less than 0.01.  Sodium 139, potassium 3.9, chloride 108, CO2 24, BUN 33, glucose 96, creatinine 1.0.  Amylase 80, lipase 31.  EKG is normal sinus rhythm.  Ventricular rate is 87, with nonspecific T-wave changes.  ASSESSMENT AND PLAN: 1. Anemia with heme-positive stool, probable gastrointestinal bleed, question    secondary to aspirin:  Will type and cross.  Will transfuse two units of    packed red blood cells.  Have discussed this with the patient, and she is    agreeable to transfusion.  Will start on IV Protonix, and have GI    evaluation.  Will follow serial hemoglobins and hematocrits. 2. Hypertension:  Have asked the patients family to bring in her medications.    Records from [facility] indicate that she was on Lotensin at 20 mg at the time    of discharge, and this will be written for now. 3. Diabetes:  For now, will follow fingerstick blood sugars and cover with    sliding scale insulin.  Will hold oral hypoglycemics until she undergoes    GI evaluation.  Again, have asked family to bring in her medications. 4. History of cerebrovascular accident:  Will hold aspirin and other    nonsteroidals for now secondary to #1. 5. Urinary tract infection:  Urinalysis does show positive leukocytes.  Will    check a urine culture and empirically treat with Cipro. 6. Hyperlipidemia:   Will continue Lipitor per the patients outpatient    regimen. Dictated by:   Farley Ly, M.D.  Attending Physician:  Glo Herring DD:  09/22/01 TD:  09/24/01 Job: [id number]

## 2010-10-03 NOTE — Discharge Summary (Signed)
[facility]  Patient:    NAKIETA, ADVINCULA Visit Number: [id number] MRN: [id number]          Service Type: MED Location: 2A A218 01 Attending Physician:  Josiah Lobo Hor Dictated by:   Farley Ly, M.D. Admit Date:  09/22/2001 Discharge Date: 09/24/2001   CC:         Hildred Laser, M.D.   Discharge Summary  DIAGNOSES: 1. Gastrointestinal bleed. 2. Anemia. 3. Hypertension. 4. Diabetes. 5. History of cerebrovascular accident. 6. Urinary tract infection.  CONSULTS:  Dr. Laural Golden, Gastroenterology.  PROCEDURES:  EGD on Sep 23, 2001.  HISTORY:  The patient is a 76 year old female who presented to the emergency room on Sep 22, 2001 complaining of weakness and dizziness.  She was found to have a hemoglobin of 8.0.  She was admitted for further evaluation.  For details of admission please see dictated H&P.  PHYSICAL EXAMINATION  VITAL SIGNS:  Patient was afebrile.  HEENT:  No oral lesions were noted.  NECK:  Supple.  CHEST:  Lungs were clear.  CARDIAC:  Rhythm was regular.  ABDOMEN:  Nontender.  RECTAL:  Dark stool which was heme-positive.  EXTREMITIES:  No peripheral edema.  NEUROLOGIC:  Mild right-sided weakness was noted.  LABORATORIES:  Head CT showed hypodensity in the left pons.  EKG was normal sinus rhythm with a ventricular rate of 87 with nonspecific T-wave changes.  CK 142, MB 3.6, troponin 0.01.  White count 10, hemoglobin 8, platelets 231,000.  Sodium 139, potassium 3.9, chloride 108, CO2 24, BUN 33, glucose 96, creatinine 1.0.  Amylase 80, lipase 31.  HOSPITAL COURSE:  The patient was admitted to a telemetry monitored bed.  She had type and cross and transfusion of 2 units of packed red cells and she was started on IV Protonix.  Dr. Laural Golden from gastroenterology was consulted. The patient underwent EGD with biopsies on Sep 23, 2001.  The patient was noted to have two bulbar ulcers, duodenitis, and gastritis.   Recommendations were for patient to be continued on PPI.  Helicobacter pylori test was performed and is pending at the time of this dictation.  The patient was noted that she probably needs a colonoscopy at some point but this can be done as an outpatient in several months.  It is recommended that patient discontinue aspirin products for at least two weeks.  The patients hemoglobin and hematocrit were 9.7 and 28.3 on Sep 24, 2001.  The patient was eating and drinking without any difficulty and had no further complaints noted.  The patient was desiring discharge home.  Arrangements are in progress for patient to be discharged home to follow up with her regular medical doctor and to follow up with Dr. Laural Golden.  DISCHARGE MEDICATIONS: 1. Protonix 40 mg b.i.d. 2. Hydrochlorothiazide 25 q.d. 3. Lotensin 20 q.d. 4. Lipitor 10 q.d. 5. Glucovance 5 mg q.d.  DISCHARGE INSTRUCTIONS:  The patient has been instructed to hold her Glucovance if her blood sugars are less than 100.  She is to discontinue aspirin products for at least two weeks. She should contact Dr. Otelia Limes office on Monday to arrange for GI follow-up and she [facility]. Dictated by:   Farley Ly, M.D. Attending Physician:  Josiah Lobo [facility] DD:  09/24/01 TD:  09/26/01 Job: [id number]

## 2010-10-03 NOTE — H&P (Signed)
NAME:  BRENYN, [name]                            ACCOUNT NO.:  [id number]   MEDICAL RECORD NO.:  [id number]                    PATIENT TYPE:   LOCATION:                                       FACILITY:   PHYSICIAN:  Hildred Laser, M.D.                 DATE OF BIRTH:   DATE OF ADMISSION:  12/28/2001  DATE OF DISCHARGE:                                HISTORY & PHYSICAL   PRESENTING COMPLAINT:  Melena.   HISTORY OF PRESENT ILLNESS:  The patient is a 76 year old African American  female who called the office earlier today requesting to be seen.  She was  in her usual state of health until last Saturday night, when she woke up and  noted that she had had a bowel movement while she was sleeping.  She passed  tarry stool.  This continued yesterday but today, the stool has become  somewhat light in color.  Yesterday, she felt weak, but she feels better  today.  She denies abdominal pain, nausea, vomiting, hematemesis, heartburn  or dysphagia.  Her appetite is good and she has not lost any weight.  She  has a history of peptic ulcer disease.  She was admitted to [facility]  between Sep 22, 2001 and Sep 24, 2001.  She presented at that time with  weakness and dizziness and was noted to have a hemoglobin of 8 g.  At that  time, her stools were heme-positive.  She underwent  esophagogastroduodenoscopy.  She was found to have a small sliding hiatal  hernia, nonerosive antral gastritis and two bulbar ulcers felt to be source  of her blood loss.  H. pylori serology was negative.  Given that she had  bulbar ulcers, it was decided to treat her with a Prevpac for 10 days; she  actually picked up samples from our office.  She was advised to hold off her  ASA for two weeks and asked to stay on PPI.  Unfortunately, she ran out of  the PPI and did not refill or did not have a prescription.  She presently  does not have a family physician and [facility].  She is back on her ASA 325  mg half a tablet daily, Lotensin 25 mg q.d.,  Glucovance two b.i.d. and Lipitor 10 mg q.d.  She does not take any OTC  medications.  She denies paresthesias or weakness.   PAST MEDICAL HISTORY:  Medical problems include hypertension and diabetes  mellitus.  She presented with acute CVA in February 2003; she had a left  pontine infarct.  She had right-sided weakness and was cared for at [facility].  Her symptoms improved with therapy.  At that time, she was  on EC-ASA 325 mg q.d.   PAST SURGICAL HISTORY:  Previous surgeries include cholecystectomy and  lumbar spine surgery.   ALLERGIES:  None known allergies.   FAMILY HISTORY:  Noncontributory.  It is negative for colorectal carcinoma.  Her daughter has been diagnosed with Crohn's disease.   SOCIAL HISTORY:  She is a widow.  She is retired.  She does not smoke  cigarettes or drink alcohol.  She lives with her grandson.   PHYSICAL EXAMINATION:  GENERAL:  Pleasant, well-developed, mildly obese  [place] American female who is no acute distress.  She weighs 178 pounds.  VITAL SIGNS:  Pulse 76 per minute, blood pressure 100/60.  HEENT:  Conjunctivae are pale.  Sclerae are nonicteric.  Oropharyngeal  mucosa is normal.  NECK:  Without masses or thyromegaly.  CARDIAC:  Regular rhythm, normal S1 and S2.  No murmur or gallop noted.  LUNGS:  Clear to auscultation.  ABDOMEN:  Her abdomen is full.  Bowel sounds are normal.  Palpation reveals  a soft abdomen without tenderness, organomegaly or masses.  RECTAL:  Examination revealed dark brown stool in the vault which is heme-  positive.  EXTREMITIES:  She does not have peripheral edema or clubbing.   LABORATORY AND ACCESSORY CLINICAL DATA:  CBC from today:  WBC 7.4, H&H are  8.6 and 24.9, MCV is 87.7, platelet count is 233,000.   ASSESSMENT:  The patient presents with a two-day history of melena.  She  does not have postural symptoms.  Her hemoglobin, however, is low at 8.6; on   her last admission when she was diagnosed with bulbar ulcers, it was 8 g.  I  suspect that she has a recurrent peptic ulcer disease.  She is on ASA at a  low dose, but she is not on acid suppression __________ protection.   RECOMMENDATION:  I have asked the patient to stop ASA for now.  Risks  outweigh the benefit at this point.   I will start her on Prevacid 30 mg p.o. b.i.d.  She was given a dose while  in the office.   She will undergo esophagogastroduodenoscopy at [facility] on December 28, 2001.   The patient was advised to come to the ER should she develop progressive  weakness or postural symptoms.   The patient should also have a total colonoscopy at some point primarily for  screening purposes, however, if EGD fails to show a bleeding lesion, this  will be done later this week.                                                Hildred Laser, M.D.    NR/MEDQ  D:  12/26/2001  T:  12/27/2001  Job:  [id number]

## 2010-10-03 NOTE — Discharge Summary (Signed)
[facility]  Patient:    ANYKA, STIKA Visit Number: [id number] MRN: [id number]          Service Type: [facility] Location: [id number] Attending Physician:  Gilmore Laroche Dictated by:   Junius Argyle, PA Admit Date:  07/13/2001 Discharge Date: 07/21/2001   CC:         [facility]  Evette Doffing, M.D.   Discharge Summary  DISCHARGE DIAGNOSES: 1. Status post left pontene infarct. 2. Hypertension. 3. Strep urinary tract infection. 4. Diabetes mellitus.  HISTORY OF PRESENT ILLNESS:  Ms. Wishon is a 76 year old female with history of hypertension, diabetes mellitus, admitted to [facility] on February 22, with two-day history of progressive right-sided weakness.  CCT done showed no acute abnormality and MRI/MRA done showed acute infarct in left pons with extensive chronic small vessel disease within the pons and deep subcortical matter, pronounced atherosclerosis, and posterior circulation and basilar artery noted to be narrowed and irregular.  Cardiac echo showed EF of 55 to 65% with mild mitral annular calcification.  Report of carotid Dopplers unavailable.  The patients blood sugars had shown poor control with hemoglobin A1c at 9.8 at admission.  Glucophage dose is being adjusted. Currently, the patient shows decrease in safety awareness, close supervision for transverse, close supervision for ambulating 180 feet with ataxic gait.  PAST MEDICAL HISTORY:  See discharge diagnoses plus history of gallstones, DJD, back surgery in 1999.  ALLERGIES:  No known drug allergies.  SOCIAL HISTORY:  The patient lives with a teenage grandson in one-level home with two steps at entry.  She was independent prior to admission and does not use tobacco or alcohol.  HOSPITAL COURSE:  Ms. Mirca Mynatt was admitted to rehabilitation on July 13, 2001, for inpatient therapies to consist of PT/OT dialy.  Past admission she  was maintained on aspirin for CVA prophylaxis.  Blood pressure was monitored on Lotensin and was noted to be borderline controlled.  Small dose Microzide was added with improvement overall.  At the time of discharge, blood pressures ranging from [id number] to [id number] systolic, 0000000 to [id number] diastolic.  The patients blood sugars were noted to be elevated and the patients Glucophage dose was maxed out.  Glucotrol is currently at 5 mg b.i.d.  Blood sugars at the time of discharge is at 130s to 150s range overall.  Weight at time of discharge is 158.9 pounds.  Her p.o. intake has been good.  Labs done past admission showed hemoglobin 11.3, hematocrit 33.6, white count 6.4, platelets 207.  Check of lytes showed sodium 141, potassium 4.3, chloride 105, CO2 27, BUN 16, creatinine 0.9, glucose 172.  LFTs within normal limits.  A UAUC at admission showed greater than 100,000 colonies of Group B Strep and the patient was started on amoxicillin for this.  She is to complete 7 total days of antibiotic therapy at time of discharge.  Ms. Segura has made good progress during her stay.  She is currently independent for transfers and modified independent for ambulating 500 feet in home and hospital environment with rolling walker.  Requires minimal assist to navigate six stairs.  She does continue with right lower extremity weakness and decrease in control.  She is safer with a walker as she can manage her balance using a device when she stumbles.  Right upper extremity continues with some minimal ataxia.  Currently in terms of ADLs, she is independent for upper body and moderately independent for lower body  care.  She is currently modified independent for simple kitchen tasks.  Speech therapy evaluation showed the patient basic comprehension high level comprehension, basic expression high level expression to be intact.  Her speech remains minimally dysarthric secondary to increasing rate, however, speech is functional  at this time and near to baseline per patient input.  Ms. Zambelli was made independent in the room prior to discharge and has been able to do this without difficulty.  She will continue with follow-up outpatient therapy at [facility] to begin on July 27, 2001.  On July 21, 2001, the patient is discharged to home.  DISCHARGE MEDICATIONS: 1. Enteric-coated aspirin 325 mg per day. 2. Lotensin 20 mg per day. 3. Glucotrol XL 5 mg b.i.d. 4. Glucophage 1000 mg a.c. breakfast, supper, and 500 mg a.c. lunch. 5. Lipitor 50 mg per day. 6. Microzide 12.5 mg per day. 7. Amoxicillin 250 mg t.i.d. through July 23, 2001.  ACTIVITY:  Use walker.  DIET:  Diabetic diet. Check blood sugars b.i.d. and record.  DISCHARGE INSTRUCTIONS:  No alcohol, no smoking.  [facility] outpatient rehabilitation to begin March 12, from 4:30 to 7 p.m.  FOLLOW-UP:  The patient is to follow up with Dr. Theda Sers for recheck on August 25, 2001, at 10:30 a.m.  Follow up at [place] in next couple of weeks. Dictated by:   Junius Argyle, PA Attending Physician:  Gilmore Laroche DD:  07/21/01 TD:  07/24/01 Job: [id number]

## 2010-10-03 NOTE — Consult Note (Signed)
[facility]  Patient:    [name], [name] Visit Number: [id number] MRN: [id number]          Service Type: MED Location: 2A A218 01 Attending Physician:  Glo Herring. Dictated by:   Doreene Eland, P.A. Proc. Date: 09/23/01 Admit Date:  09/22/2001   CC:         Farley Ly, M.D.  Beckey Rutter, M.D.  [facility]   Consultation Report  ROOM:  [id number]  REASON FOR CONSULTATION:  Heme positive stool and anemia.  REFERRING PHYSICIAN:  Farley Ly, M.D./Frances DeChurch, M.D.  HISTORY OF PRESENT ILLNESS:  This patient is a very pleasant, 76 year old, African-American female admitted yesterday to the hospital service after presenting to the emergency department because of visual changes and weakness noted upon arising.  She had felt fine the day before.  In the emergency department she was found to have a hemoglobin of 8.0 with heme positive stool. Head CT showed a left pontine infarct.  Notably was hospitalized in February 2003 with acute left pontine infarct. Was started on aspirin 325 mg in February after the acute infarct.  Has not been on PPI.  No history of peptic ulcer disease.  Has one week history of black tarry diarrhea without fresh blood.  This is accompanied by stinging periumbilical pain.  The pain does not seem to be related to meals or bowel movements.  Has been intermittent over the last week.  No nausea, vomiting, dysphagia, odynophagia, heartburn, hematochezia or constipation.  Has had intermittent black tarry stools over the last year.  Denies any other NSAID use other than daily aspirin.  No history of prior EGD or total colonoscopy. Since admission has received 2 units of packed rbcs and hemoglobin is up to 10.9.  Has remained hemodynamically stable.  Visual changes are gone and she is feeling a little bit stronger.  While hospitalized in February her hemoglobin ranged from 14-11.3.  Had an  echo done in February 2003 which revealed ejection fraction of 55-65% with mild mitral annular calcification.  Notably has history of hypertension as well as diabetes mellitus with hemoglobin A1c in February of 9.8.  Weight at discharge was 158.9 pounds.  MEDICATIONS:  Prior to admission include enteric coated aspirin 325 mg, Lotensin, Glucotrol, Glucophage, Lipitor, and microside.  Denies any other NSAID use besides aspirin.  ALLERGIES:  No known drug allergies.  PAST MEDICAL HISTORY:  Acute left pontine infarct February 2003.  Presented with typical right-sided weakness and fatigue, history of hypertension, history of strep UTI while hospitalized in February, history of diabetes mellitus with hemoglobin A1c 9.25 June 2001, echocardiogram February 2003 revealed ejection fraction of 55-65% with mild mitral annular calcification. History of cholecystectomy, DJD, and back surgery in 1999.  FAMILY HISTORY:  There is no family history of colorectal cancer, or chronic stomach disease.  No chronic liver disease.  She has 1 daughter who is previously diagnosed with Crohns disease.  SOCIAL HISTORY:  This patient lives in town here, with her teenage grandson. She is widowed.  She has never smoked tobacco, but did use to dip snuff.  Does not drink any alcohol.  REVIEW OF SYSTEMS:  As in HPI.  In addition, no fever.  Appetite has been good and weight has been stable.  No chest pains.  No history of MI, no cough or dyspnea.  No hematemesis or dysuria.  No easy bruising or bleeding.  No history of liver disease.  Positive history of  diabetes mellitus as outlined above.  PHYSICAL EXAMINATION:  VITAL SIGNS:  Temperature 97.6, pulse 70, respirations 20, blood pressure 142/73.  Finger stick blood sugar 258.  GENERAL:  A very pleasant, cooperative, alert, well-developed, well-nourished African-American female in no acute distress.  Answers questions quickly and appropriately.  Multiple  family members at bedside including her daughter.  HEENT:  Atraumatic, normocephalic.  PERRL.  Sclerae anicteric.  Conjunctivae clear.  Oropharynx is pink and moist without lesions, erythema or exudate.  NECK:  Supple.  No masses.  No adenopathy.  No JVD.  LUNGS:  Clear to auscultation bilaterally.  CARDIOVASCULAR:  S1, S2 regular rate and rhythm without appreciated murmur.  ABDOMEN:  Positive normoactive bowel sounds.  Soft, full, nontender, nondistended.  No masses or organomegaly.  RECTAL:  Exam deferred as she had one done last night in the emergency department.  Heme positive stool in the emergency department.  EXTREMITIES:  No lower extremity edema with 2+ dorsalis pedis pulses bilaterally.  SKIN:  Warm and dry with normal hair distribution.  No jaundice.  LABORATORY WORK:  Hemoglobin 10.9, hematocrit 31.7 today after receiving 2 units of rbcs.  White count 8600, platelets 222,000.  Cardiac enzymes negative x3.  Sodium 141, potassium 3.9, chloride 107, CO2 28, BUN 19 (notably 33 yesterday), creatinine 0.9, glucose 118.  T4 7.5, TSH 0.950, Amylase 80, lipase 31.  UA revealed trace leukocytes with a few squamous epithelials, and 3-6 wbcs.  EKG was normal sinus rhythm of 87. In February 2003 PT was 13.3 with an INR of 1.0 and a PTT 32.  IMPRESSION:  This patient is a lovely, 76 year old with GI bleeding and anemia.  The patient describes a history of melena and will, therefore, initiate workup with evaluation of upper GI tract.  Suspect that she is experiencing upper GI bleeding.  May have ulcerative/erosive esophagitis, peptic ulcer disease (recently started on NSAID/Helicobacter pylori status uncertain), AVMs, gastritis, NSAID induced gastropathy/enteropathy leading to bleeding.  If no source of bleeding is found on esophagogastroduodenoscopy probably will  proceed with total colonoscopy in the next day or 2.  In any case, recommend total colonoscopy, at some point, in  the near future for colorectal cancer screening if not needed, sooner for diagnostic purposes.  SUGGESTIONS:  EGD today.  Agree with PPI and holding aspirin/NSAIDS for the time being.  Otherwise, plan as outlined above.  We would like to thank Dr. Ulice Bold, and Dr. Hillery Jacks for allowing [place] to participate in the care of this very nice woman. Dictated by:   Doreene Eland, P.A. Attending Physician:  Glo Herring DD:  09/23/01 TD:  09/23/01 Job: [id number]

## 2010-10-03 NOTE — Discharge Summary (Signed)
NAME:  [name], [name]                            ACCOUNT NO.:  [id number]   MEDICAL RECORD NO.:  [id number]                   PATIENT TYPE:  INP   LOCATION:  A202                                 FACILITY:  [facility]   PHYSICIAN:  Hildred Laser, M.D.                 DATE OF BIRTH:  Jan 24, 1935   DATE OF ADMISSION:  12/28/2001  DATE OF DISCHARGE:  12/29/2001                                 DISCHARGE SUMMARY   DISCHARGE DIAGNOSES:  1. Upper gastrointestinal bleed secondary to duodenal ulcers.  2. Anemia secondary to gastrointestinal bleed.  3. Hypertension.  4. Diabetes mellitus.  5. History of cerebrovascular accident.  6. Hyperlipidemia.   PRINCIPAL PROCEDURE:  EGD with coagulation of bleeding duodenal ulcer on  December 28, 2001, by Dr. Laural Golden.   CONDITION ON DISCHARGE:  Much improved.   DISCHARGE MEDICATIONS:  1. Lotensin 25 mg q.d.  2. Lipitor 10 mg q.d.  3. Glucovance 5/500 2 tablets b.i.d.  4. Protonix 40 mg b.i.d. for four days, then 1 before breakfast daily.   HOSPITAL COURSE:  The patient is a 76 year old African-American female who  presented to the office on December 26, 2001, with history of melena.  She has  a history of peptic ulcer disease.  She was hospitalized in May of this year  and diagnosed with duodenal ulcer.  Her H. pylori was negative.  Given  endoscopic findings, she was given Prevpac for 10 days.  She resumed her ASA  but stopped her PPI.  She, apparently, does not have a regular physician.  When I saw her in the office she was hemodynamically stable.  She was  brought for outpatient esophagogastroduodenoscopy on December 28, 2001.  She  had a small sliding hiatal hernia, three small __________  ulcers.  One was  oozing and was coagulated with good control.  She was, therefore, admitted  following the EGD.  Her H&H were 7.8 and 23.2.  She was begun on IV fluids,  given PPI.  She was typed and cross matched, and she received two units of  packed red blood cells.   She did not have any reaction to transfusion.  The  following morning her H&H were up to 10.4 and 29.9.  She had no abdominal  pain, nausea, or vomiting.  She also did not feel weak or have postural  symptoms.  She was discharged on PPI.  It was decided to hold off her ASA  until her office visit.  She was also advised to take OTC NSAIDs.                                                Hildred Laser, M.D.   NR/MEDQ  D:  02/10/2002  T:  02/11/2002  Job:  [id number]  

## 2010-10-03 NOTE — Op Note (Signed)
NAME:  [name], [name]                            ACCOUNT NO.:  [id number]   MEDICAL RECORD NO.:  [id number]                   PATIENT TYPE:  AMB   LOCATION:  DAY                                  FACILITY:  [facility]   PHYSICIAN:  R. Garfield Cornea, M.D.              DATE OF BIRTH:  [birth year]   DATE OF PROCEDURE:  12/12/2003  DATE OF DISCHARGE:                                 OPERATIVE REPORT   PROCEDURE PERFORMED:  Screening colonoscopy.   INDICATIONS FOR PROCEDURE:  The patient is a 76 year old lady who has never  had a colonoscopy, who comes for screening.  She is currently devoid of any  gastrointestinal tract symptoms and no family history of colorectal  neoplasia and again, she has never had her lower  GI tract imaged.  Colonoscopy is now being done as a standard screening maneuver.  This  approach has been discussed with the patient at length previously and again  at bedside.  The potential risks, benefits and alternatives have been  reviewed, please see the dictated history and physical for more information.   DESCRIPTION OF PROCEDURE:  Oxygen saturations, blood pressure, pulse and  respirations were monitored throughout the entire procedure.  Conscious  sedation was Versed 4 mg IV, Demerol 100 mg IV in divided doses.  Instrument  was the Olympus video endoscope system.   FINDINGS:  Digital rectal exam revealed no abnormalities.  Endoscopic prep  was adequate.  Rectum:  Examination of rectal mucosa including retroflex view of the anal  verge revealed no abnormalities.  Colon:  Colonic mucosa was surveyed from the rectosigmoid junction through  the left, transverse and right colon to the area of the appendiceal orifice,  ileocecal valve and cecum.  These structures were well seen and photographed  for the record.  From this level, the scope was slowly withdrawn and all  previously mentioned mucosal surfaces were again seen.  The patient had a  capacious elongated tortuous colon  which required a number of changes in the  patient's position and external abdominal pressure and maneuvers to get to  the cecum.  The colonic mucosa, however, appeared normal.  The patient  tolerated the procedure well and was reacted in endoscopy.   IMPRESSION:  Normal rectum.  Long capacious tortuous colon, colonic mucosa  appeared normal.   RECOMMENDATIONS:  Repeat colonoscopy in 10 years.      ___________________________________________                                            Bridgette Habermann, M.D.   RMR/MEDQ  D:  12/12/2003  T:  12/12/2003  Job:  [id number]   cc:   Tesfaye D. Legrand Rams, M.D.  [address]  [place]  [place] [address]  Fax: [phone]

## 2010-10-03 NOTE — Op Note (Signed)
NAME:  [name], [name]                            ACCOUNT NO.:  [id number]   MEDICAL RECORD NO.:  [id number]                   PATIENT TYPE:  INP   LOCATION:  A202                                 FACILITY:  [facility]   PHYSICIAN:  Hildred Laser, M.D.                 DATE OF BIRTH:  [birth year]   DATE OF PROCEDURE:  12/28/2001  DATE OF DISCHARGE:  12/29/2001                                 OPERATIVE REPORT   PROCEDURE:  Esophagogastroduodenoscopy with therapeutic intervention.   INDICATIONS FOR PROCEDURE:  The patient is a 76 year old African American  female with history of peptic ulcer disease who presents with melena and  anemia previously seen in the office two days ago, and a hemoglobin of 8.6.  Procedure was reviewed with the patient and informed consent was obtained.   PREOPERATIVE MEDICATIONS:  Cetacaine spray for pharyngeal topical  anesthesia, Demerol 25 mg IV and Versed 5 mg IV in divided doses.   INSTRUMENT:  Esophageal manometry.   FINDINGS:  Procedure performed in the endoscopy suite.  Patient's vital  signs and O2 saturation were monitored during the procedure and remained  stable.  Patient was placed in left lateral recumbent position and endoscope  was passed through oropharynx without any difficulty into the esophagus.   Esophagus:  Mucosa of the esophagus is normal throughout.  Squamocolumnar  junction is unremarkable.  There was a small sliding hiatal hernia.   Stomach:  It was empty and distended very well with insufflation.  Folds of  the proximal stomach were normal.  Examination of mucosa of the gastric  body, antrum, pyloric channel, as well as angularis and fundus and cardia  was normal.   Duodenum:  Examination of the of bulb revealed three small ulcers, one  located along the posterior wall had a tiny blood vessel, but there was no  active bleeding.  Ulcer along the medial wall was oozing.  Third ulcer was  small and had a clean base.  Mucosa and folds of  second portion of the  duodenum were normal.  Two of these ulcers, one with oozing and the other  one with blood vessel were coagulated using heater probe unit with good  result.  No bleeding was induced during or after the therapy and the scope  was withdrawn.   Patient tolerated the procedure well.   FINAL DIAGNOSES:  1. Small sliding hiatal hernia.  2. Three small bulbar ulcers, two with stigmata of bleeding and these were     coagulated using heater probe unit.  3. Please note the patient's hemoglobin is 7.8 g.  4. Patient's ________ serology three months ago is negative, but I elected     to treat her with a Prevpac because of the location of these ulcers.   PLAN:  Patient will be admitted.  She will be typed and cross-matches, and  given three units of PRBCs.  Will continue PPI at a double dose.  Will need  to hold her ASA for now.   Will also check serum and gastrin level.                                                      Hildred Laser, M.D.    NR/MEDQ  D:  12/28/2001  T:  01/03/2002  Job:  [id number]

## 2011-02-19 LAB — BASIC METABOLIC PANEL
Calcium: 9.5 mg/dL (ref 8.4–10.5)
Creatinine, Ser: 1.57 mg/dL — ABNORMAL HIGH (ref 0.4–1.2)
GFR calc Af Amer: 39 mL/min — ABNORMAL LOW (ref >60)
GFR calc non Af Amer: 32 mL/min — ABNORMAL LOW (ref >60)
Sodium: 139 mEq/L (ref 135–145)

## 2011-02-19 LAB — CBC
Hemoglobin: 11.7 g/dL — ABNORMAL LOW (ref 12.0–15.0)
MCHC: 33.6 g/dL (ref 30.0–36.0)
RBC: 3.82 MIL/uL — ABNORMAL LOW (ref 3.87–5.11)
WBC: 8.9 10*3/uL (ref 4.0–10.5)

## 2011-02-19 LAB — DIFFERENTIAL
Basophils Relative: 1 % (ref 0–1)
Lymphocytes Relative: 22 % (ref 12–46)
Lymphs Abs: 1.9 10*3/uL (ref 0.7–4.0)
Monocytes Absolute: 0.6 10*3/uL (ref 0.1–1.0)
Monocytes Relative: 7 % (ref 3–12)
Neutro Abs: 6.1 10*3/uL (ref 1.7–7.7)
Neutrophils Relative %: 69 % (ref 43–77)

## 2011-02-19 LAB — URINALYSIS, ROUTINE W REFLEX MICROSCOPIC
Glucose, UA: NEGATIVE mg/dL
Leukocytes, UA: NEGATIVE
Protein, ur: NEGATIVE mg/dL
pH: 6.5 (ref 5.0–8.0)

## 2011-02-19 LAB — URINE MICROSCOPIC-ADD ON

## 2011-02-25 LAB — CROSSMATCH
ABO/RH(D): A POS
Antibody Screen: NEGATIVE

## 2011-02-25 LAB — DIFFERENTIAL
Basophils Absolute: 0
Basophils Relative: 1
Neutro Abs: 4.3
Neutrophils Relative %: 64

## 2011-02-25 LAB — URINALYSIS, ROUTINE W REFLEX MICROSCOPIC
Nitrite: POSITIVE — AB
Specific Gravity, Urine: 1.02
Urobilinogen, UA: 0.2
pH: 5.5

## 2011-02-25 LAB — BASIC METABOLIC PANEL
CO2: 25
Calcium: 9.2
Creatinine, Ser: 1.1
GFR calc Af Amer: 59 — ABNORMAL LOW
GFR calc non Af Amer: 49 — ABNORMAL LOW
Glucose, Bld: 151 — ABNORMAL HIGH

## 2011-02-25 LAB — URINE MICROSCOPIC-ADD ON

## 2011-02-25 LAB — HEMOGLOBIN AND HEMATOCRIT, BLOOD
HCT: 23.2 — ABNORMAL LOW
HCT: 26.4 — ABNORMAL LOW
HCT: 28.5 — ABNORMAL LOW
Hemoglobin: 8 — ABNORMAL LOW

## 2011-02-25 LAB — PROTIME-INR
INR: 0.9
Prothrombin Time: 12.3

## 2011-02-25 LAB — CBC
MCHC: 33.4
RDW: 15 — ABNORMAL HIGH

## 2011-02-25 LAB — APTT: aPTT: 32

## 2011-06-16 ENCOUNTER — Other Ambulatory Visit (HOSPITAL_COMMUNITY): Payer: Self-pay | Admitting: Internal Medicine

## 2011-06-16 DIAGNOSIS — E041 Nontoxic single thyroid nodule: Secondary | ICD-10-CM

## 2011-06-17 ENCOUNTER — Ambulatory Visit (HOSPITAL_COMMUNITY)
Admission: RE | Admit: 2011-06-17 | Discharge: 2011-06-17 | Disposition: A | Discharge status: Home / Self Care | Payer: Medicare Other | Source: Ambulatory Visit | Attending: Internal Medicine | Admitting: Internal Medicine

## 2011-06-17 DIAGNOSIS — E042 Nontoxic multinodular goiter: Secondary | ICD-10-CM | POA: Insufficient documentation

## 2011-06-17 DIAGNOSIS — E041 Nontoxic single thyroid nodule: Secondary | ICD-10-CM

## 2011-06-29 ENCOUNTER — Other Ambulatory Visit (HOSPITAL_COMMUNITY): Payer: Self-pay | Admitting: "Endocrinology

## 2011-06-29 DIAGNOSIS — R221 Localized swelling, mass and lump, neck: Secondary | ICD-10-CM

## 2011-06-30 ENCOUNTER — Other Ambulatory Visit (HOSPITAL_COMMUNITY): Payer: Self-pay | Admitting: "Endocrinology

## 2011-06-30 ENCOUNTER — Ambulatory Visit (HOSPITAL_COMMUNITY)
Admission: RE | Admit: 2011-06-30 | Discharge: 2011-06-30 | Disposition: A | Discharge status: Home / Self Care | Payer: Medicare Other | Source: Ambulatory Visit | Attending: "Endocrinology | Admitting: "Endocrinology

## 2011-06-30 DIAGNOSIS — R221 Localized swelling, mass and lump, neck: Secondary | ICD-10-CM

## 2011-06-30 NOTE — Progress Notes (Signed)
Patient ID: [name], female   DOB: 1934-09-22, 77 y.o.   MRN: [id number]  I have reviewed the patient's ultrasound of 06/17/2011. The patient has numerous bilateral thyroid nodules, five which meet the size criteria for biopsy. Several show cystic foci with mural nodularity, without internal blood flow within the mural nodules.  A hypervascular solid nodule is seen at the inferior pole right lobe.  Complex cystic lesion is seen at the isthmus.  None of the nodules is dominant or more worrisome than the remaining nodules.  After discussion with Dr. Kathlene Cote, recommend follow-up thyroid sonography in 6 months to reassess these numerous nonspecific nodules for stability, rather than randomly biopsying 1 or 2 of the observed nodules, since follow-up imaging will be required regardless. I discussed this approach with the patient, patient's family and Dr. Dorris Fetch, all of whom agree.  Recommend follow-up ultrasound in 6 months to reassess. Any nodules which show interval change at time of follow-up imaging can then be sampled.

## 2011-07-29 ENCOUNTER — Other Ambulatory Visit (HOSPITAL_COMMUNITY): Payer: Self-pay | Admitting: "Endocrinology

## 2011-07-29 DIAGNOSIS — E042 Nontoxic multinodular goiter: Secondary | ICD-10-CM

## 2011-11-23 ENCOUNTER — Ambulatory Visit (HOSPITAL_COMMUNITY): Payer: Medicare Other

## 2012-02-17 ENCOUNTER — Other Ambulatory Visit (HOSPITAL_COMMUNITY): Payer: Self-pay | Admitting: "Endocrinology

## 2012-02-17 DIAGNOSIS — E042 Nontoxic multinodular goiter: Secondary | ICD-10-CM

## 2012-03-03 ENCOUNTER — Other Ambulatory Visit (HOSPITAL_COMMUNITY): Payer: Self-pay | Admitting: Internal Medicine

## 2012-03-03 DIAGNOSIS — Z139 Encounter for screening, unspecified: Secondary | ICD-10-CM

## 2012-03-07 ENCOUNTER — Ambulatory Visit (HOSPITAL_COMMUNITY): Payer: Medicare Other

## 2012-03-14 ENCOUNTER — Ambulatory Visit (HOSPITAL_COMMUNITY): Payer: Medicare Other | Attending: "Endocrinology

## 2012-04-19 ENCOUNTER — Other Ambulatory Visit (HOSPITAL_COMMUNITY): Payer: Self-pay | Admitting: "Endocrinology

## 2012-04-19 DIAGNOSIS — E042 Nontoxic multinodular goiter: Secondary | ICD-10-CM

## 2012-04-21 ENCOUNTER — Ambulatory Visit (HOSPITAL_COMMUNITY)
Admission: RE | Admit: 2012-04-21 | Discharge: 2012-04-21 | Disposition: A | Discharge status: Home / Self Care | Payer: Medicare Other | Source: Ambulatory Visit | Attending: "Endocrinology | Admitting: "Endocrinology

## 2012-04-21 ENCOUNTER — Other Ambulatory Visit (HOSPITAL_COMMUNITY): Payer: Self-pay | Admitting: "Endocrinology

## 2012-04-21 DIAGNOSIS — E042 Nontoxic multinodular goiter: Secondary | ICD-10-CM | POA: Insufficient documentation

## 2012-04-21 NOTE — Progress Notes (Signed)
Lidocaine 2%         73mL injected                 Bilateral thyroid biopsies performed

## 2013-02-21 ENCOUNTER — Encounter (HOSPITAL_COMMUNITY): Payer: Self-pay | Admitting: Pharmacy Technician

## 2013-02-22 NOTE — Patient Instructions (Signed)
[name]  02/22/2013   Your procedure is scheduled on:  02/28/13  Report to [facility] at 0800 AM.  Call this number if you have problems the morning of surgery: [phone]   Remember:   Do not eat food or drink liquids after midnight.   Take these medicines the morning of surgery with A SIP OF WATER:    Do not wear jewelry, make-up or nail polish.  Do not wear lotions, powders, or perfumes. You may wear deodorant.  Do not shave 48 hours prior to surgery. Men may shave face and neck.  Do not bring valuables to the hospital.  [facility] is not responsible                  for any belongings or valuables.               Contacts, dentures or bridgework may not be worn into surgery.  Leave suitcase in the car. After surgery it may be brought to your room.  For patients admitted to the hospital, discharge time is determined by your                treatment team.               Patients discharged the day of surgery will not be allowed to drive  home.  Name and phone number of your driver: family  Special Instructions: N/A   Please read over the following fact sheets that you were given: Anesthesia Post-op Instructions and Care and Recovery After Surgery   PATIENT INSTRUCTIONS POST-ANESTHESIA  IMMEDIATELY FOLLOWING SURGERY:  Do not drive or operate machinery for the first twenty four hours after surgery.  Do not make any important decisions for twenty four hours after surgery or while taking narcotic pain medications or sedatives.  If you develop intractable nausea and vomiting or a severe headache please notify your doctor immediately.  FOLLOW-UP:  Please make an appointment with your surgeon as instructed. You do not need to follow up with anesthesia unless specifically instructed to do so.  WOUND CARE INSTRUCTIONS (if applicable):  Keep a dry clean dressing on the anesthesia/puncture wound site if there is drainage.  Once the wound has quit draining you may leave it open to air.   Generally you should leave the bandage intact for twenty four hours unless there is drainage.  If the epidural site drains for more than 36-48 hours please call the anesthesia department.  QUESTIONS?:  Please feel free to call your physician or the hospital operator if you have any questions, and they will be happy to assist you.      Cataract Surgery  A cataract is a clouding of the lens of the eye. When a lens becomes cloudy, vision is reduced based on the degree and nature of the clouding. Surgery may be needed to improve vision. Surgery removes the cloudy lens and usually replaces it with a substitute lens (intraocular lens, IOL). LET YOUR EYE DOCTOR KNOW ABOUT:  Allergies to food or medicine.  Medicines taken including herbs, eyedrops, over-the-counter medicines, and creams.  Use of steroids (by mouth or creams).  Previous problems with anesthetics or numbing medicine.  History of bleeding problems or blood clots.  Previous surgery.  Other health problems, including diabetes and kidney problems.  Possibility of pregnancy, if this applies. RISKS AND COMPLICATIONS  Infection.  Inflammation of the eyeball (endophthalmitis) that can spread to both eyes (sympathetic ophthalmia).  Poor wound healing.  If an IOL is inserted, it can later fall out of proper position. This is very uncommon.  Clouding of the part of your eye that holds an IOL in place. This is called an "after-cataract." These are uncommon, but easily treated. BEFORE THE PROCEDURE  Do not eat or drink anything except small amounts of water for 8 to 12 before your surgery, or as directed by your caregiver.  Unless you are told otherwise, continue any eyedrops you have been prescribed.  Talk to your primary caregiver about all other medicines that you take (both prescription and non-prescription). In some cases, you may need to stop or change medicines near the time of your surgery. This is most important if you  are taking blood-thinning medicine.Do not stop medicines unless you are told to do so.  Arrange for someone to drive you to and from the procedure.  Do not put contact lenses in either eye on the day of your surgery. PROCEDURE There is more than one method for safely removing a cataract. Your doctor can explain the differences and help determine which is best for you. Phacoemulsification surgery is the most common form of cataract surgery.  An injection is given behind the eye or eyedrops are given to make this a painless procedure.  A small cut (incision) is made on the edge of the clear, dome-shaped surface that covers the front of the eye (cornea).  A tiny probe is painlessly inserted into the eye. This device gives off ultrasound waves that soften and break up the cloudy center of the lens. This makes it easier for the cloudy lens to be removed by suction.  An IOL may be implanted.  The normal lens of the eye is covered by a clear capsule. Part of that capsule is intentionally left in the eye to support the IOL.  Your surgeon may or may not use stitches to close the incision. There are other forms of cataract surgery that require a larger incision and stiches to close the eye. This approach is taken in cases where the doctor feels that the cataract cannot be easily removed using phacoemulsification. AFTER THE PROCEDURE  When an IOL is implanted, it does not need care. It becomes a permanent part of your eye and cannot be seen or felt.  Your doctor will schedule follow-up exams to check on your progress.  Review your other medicines with your doctor to see which can be resumed after surgery.  Use eyedrops or take medicine as prescribed by your doctor. Document Released: 04/23/2011 Document Revised: 07/27/2011 Document Reviewed: 04/23/2011 [facility] Patient Information 2014 [place], [place].

## 2013-02-23 ENCOUNTER — Other Ambulatory Visit: Payer: Self-pay

## 2013-02-23 ENCOUNTER — Encounter (HOSPITAL_COMMUNITY)
Admission: RE | Admit: 2013-02-23 | Discharge: 2013-02-23 | Disposition: A | Discharge status: Home / Self Care | Payer: Medicare Other | Source: Ambulatory Visit | Attending: Ophthalmology | Admitting: Ophthalmology

## 2013-02-23 ENCOUNTER — Encounter (HOSPITAL_COMMUNITY): Payer: Self-pay | Admitting: Pharmacy Technician

## 2013-02-23 ENCOUNTER — Encounter (HOSPITAL_COMMUNITY): Payer: Self-pay

## 2013-02-23 DIAGNOSIS — Z01818 Encounter for other preprocedural examination: Secondary | ICD-10-CM | POA: Insufficient documentation

## 2013-02-23 DIAGNOSIS — Z01810 Encounter for preprocedural cardiovascular examination: Secondary | ICD-10-CM | POA: Insufficient documentation

## 2013-02-23 DIAGNOSIS — Z01812 Encounter for preprocedural laboratory examination: Secondary | ICD-10-CM | POA: Insufficient documentation

## 2013-02-23 HISTORY — DX: Gastro-esophageal reflux disease without esophagitis: K21.9

## 2013-02-23 HISTORY — DX: Type 2 diabetes mellitus without complications: E11.9

## 2013-02-23 HISTORY — DX: Essential (primary) hypertension: I10

## 2013-02-23 HISTORY — DX: Unspecified osteoarthritis, unspecified site: M19.90

## 2013-02-23 LAB — HEMOGLOBIN AND HEMATOCRIT, BLOOD
HCT: 38.2 % (ref 36.0–46.0)
Hemoglobin: 12.9 g/dL (ref 12.0–15.0)

## 2013-02-23 LAB — BASIC METABOLIC PANEL
CO2: 29 mEq/L (ref 19–32)
Calcium: 9.7 mg/dL (ref 8.4–10.5)
Chloride: 96 mEq/L (ref 96–112)
GFR calc Af Amer: 47 mL/min — ABNORMAL LOW (ref >90)
Sodium: 134 mEq/L — ABNORMAL LOW (ref 135–145)

## 2013-02-27 MED ORDER — PHENYLEPHRINE HCL 2.5 % OP SOLN
OPHTHALMIC | Status: AC
  Filled 2013-02-27: qty 15

## 2013-02-27 MED ORDER — KETOROLAC TROMETHAMINE 0.5 % OP SOLN
OPHTHALMIC | Status: AC
  Filled 2013-02-27: qty 5

## 2013-02-27 MED ORDER — CYCLOPENTOLATE-PHENYLEPHRINE OP SOLN OPTIME - NO CHARGE
OPHTHALMIC | Status: AC
  Filled 2013-02-27: qty 2

## 2013-02-28 ENCOUNTER — Encounter (HOSPITAL_COMMUNITY)
Admission: RE | Disposition: A | Discharge status: Home / Self Care | Payer: Self-pay | Source: Ambulatory Visit | Attending: Ophthalmology

## 2013-02-28 ENCOUNTER — Encounter (HOSPITAL_COMMUNITY): Payer: Self-pay | Admitting: *Deleted

## 2013-02-28 ENCOUNTER — Ambulatory Visit (HOSPITAL_COMMUNITY): Payer: Medicare Other | Admitting: Anesthesiology

## 2013-02-28 ENCOUNTER — Ambulatory Visit (HOSPITAL_COMMUNITY)
Admission: RE | Admit: 2013-02-28 | Discharge: 2013-02-28 | Disposition: A | Discharge status: Home / Self Care | Payer: Medicare Other | Source: Ambulatory Visit | Attending: Ophthalmology | Admitting: Ophthalmology

## 2013-02-28 ENCOUNTER — Encounter (HOSPITAL_COMMUNITY): Payer: Medicare Other | Admitting: Anesthesiology

## 2013-02-28 DIAGNOSIS — E119 Type 2 diabetes mellitus without complications: Secondary | ICD-10-CM | POA: Insufficient documentation

## 2013-02-28 DIAGNOSIS — Z794 Long term (current) use of insulin: Secondary | ICD-10-CM | POA: Insufficient documentation

## 2013-02-28 DIAGNOSIS — H2510 Age-related nuclear cataract, unspecified eye: Secondary | ICD-10-CM | POA: Insufficient documentation

## 2013-02-28 DIAGNOSIS — I10 Essential (primary) hypertension: Secondary | ICD-10-CM | POA: Insufficient documentation

## 2013-02-28 DIAGNOSIS — Z01812 Encounter for preprocedural laboratory examination: Secondary | ICD-10-CM | POA: Insufficient documentation

## 2013-02-28 HISTORY — PX: CATARACT EXTRACTION W/PHACO: SHX586

## 2013-02-28 LAB — GLUCOSE, CAPILLARY: Glucose-Capillary: 142 mg/dL — ABNORMAL HIGH (ref 70–99)

## 2013-02-28 SURGERY — PHACOEMULSIFICATION, CATARACT, WITH IOL INSERTION
Anesthesia: Monitor Anesthesia Care | Site: Eye | Laterality: Left | Wound class: Clean

## 2013-02-28 MED ORDER — LIDOCAINE HCL (PF) 1 % IJ SOLN
INTRAMUSCULAR | Status: DC | PRN
  Administered 2013-02-28: .5 mL

## 2013-02-28 MED ORDER — MIDAZOLAM HCL 2 MG/2ML IJ SOLN
INTRAMUSCULAR | Status: AC
  Filled 2013-02-28: qty 2

## 2013-02-28 MED ORDER — TETRACAINE HCL 0.5 % OP SOLN
OPHTHALMIC | Status: AC
  Filled 2013-02-28: qty 2

## 2013-02-28 MED ORDER — EPINEPHRINE HCL 1 MG/ML IJ SOLN
INTRAMUSCULAR | Status: DC | PRN
  Administered 2013-02-28: 09:00:00

## 2013-02-28 MED ORDER — TETRACAINE HCL 0.5 % OP SOLN
1.0000 [drp] | OPHTHALMIC | Status: AC
  Administered 2013-02-28 (×3): 1 [drp] via OPHTHALMIC

## 2013-02-28 MED ORDER — PROVISC 10 MG/ML IO SOLN
INTRAOCULAR | Status: DC | PRN
  Administered 2013-02-28: 8.5 mg via INTRAOCULAR

## 2013-02-28 MED ORDER — KETOROLAC TROMETHAMINE 0.5 % OP SOLN
1.0000 [drp] | OPHTHALMIC | Status: AC
  Administered 2013-02-28 (×3): 1 [drp] via OPHTHALMIC

## 2013-02-28 MED ORDER — MIDAZOLAM HCL 2 MG/2ML IJ SOLN
1.0000 mg | INTRAMUSCULAR | Status: DC | PRN
  Administered 2013-02-28: 2 mg via INTRAVENOUS

## 2013-02-28 MED ORDER — LACTATED RINGERS IV SOLN
INTRAVENOUS | Status: DC
  Administered 2013-02-28: 09:00:00 via INTRAVENOUS

## 2013-02-28 MED ORDER — BSS IO SOLN
INTRAOCULAR | Status: DC | PRN
  Administered 2013-02-28: 15 mL via INTRAOCULAR

## 2013-02-28 MED ORDER — TETRACAINE 0.5 % OP SOLN OPTIME - NO CHARGE
OPHTHALMIC | Status: DC | PRN
  Administered 2013-02-28: 1 [drp] via OPHTHALMIC

## 2013-02-28 MED ORDER — ONDANSETRON HCL 4 MG/2ML IJ SOLN: 4.0000 mg | Freq: Once | INTRAMUSCULAR | Status: DC | PRN

## 2013-02-28 MED ORDER — FENTANYL CITRATE 0.05 MG/ML IJ SOLN: 25.0000 ug | INTRAMUSCULAR | Status: DC | PRN

## 2013-02-28 MED ORDER — PHENYLEPHRINE HCL 2.5 % OP SOLN
1.0000 [drp] | OPHTHALMIC | Status: AC
  Administered 2013-02-28 (×3): 1 [drp] via OPHTHALMIC

## 2013-02-28 MED ORDER — CYCLOPENTOLATE-PHENYLEPHRINE 0.2-1 % OP SOLN
1.0000 [drp] | OPHTHALMIC | Status: AC
  Administered 2013-02-28 (×3): 1 [drp] via OPHTHALMIC

## 2013-02-28 SURGICAL SUPPLY — 11 items
CLOTH BEACON ORANGE TIMEOUT ST (SAFETY) ×2 IMPLANT
EYE SHIELD UNIVERSAL CLEAR (GAUZE/BANDAGES/DRESSINGS) ×2 IMPLANT
GLOVE BIO SURGEON STRL SZ 6.5 (GLOVE) ×2 IMPLANT
GLOVE BIOGEL PI IND STRL 7.0 (GLOVE) ×1 IMPLANT
GLOVE BIOGEL PI INDICATOR 7.0 (GLOVE) ×1
PAD ARMBOARD 7.5X6 YLW CONV (MISCELLANEOUS) ×2 IMPLANT
RING MALYGIN (MISCELLANEOUS) ×2 IMPLANT
SIGHTPATH CAT PROC W REG LENS (Ophthalmic Related) ×2 IMPLANT
TAPE SURG TRANSPORE 1 IN (GAUZE/BANDAGES/DRESSINGS) ×1 IMPLANT
TAPE SURGICAL TRANSPORE 1 IN (GAUZE/BANDAGES/DRESSINGS) ×1
WATER STERILE IRR 250ML POUR (IV SOLUTION) ×2 IMPLANT

## 2013-02-28 NOTE — Anesthesia Preprocedure Evaluation (Signed)
Anesthesia Evaluation  Patient identified by MRN, date of birth, ID band Patient awake    Reviewed: Allergy & Precautions, H&P , NPO status , Patient's Chart, lab work & pertinent test results  Airway Mallampati: II TM Distance: >3 FB     Dental  (+) Edentulous Upper and Edentulous Lower   Pulmonary neg pulmonary ROS,  breath sounds clear to auscultation        Cardiovascular hypertension, Pt. on medications Rhythm:Regular Rate:Normal     Neuro/Psych    GI/Hepatic GERD-  Medicated,  Endo/Other  diabetes, Poorly Controlled, Type 2, Insulin Dependent  Renal/GU      Musculoskeletal   Abdominal   Peds  Hematology   Anesthesia Other Findings   Reproductive/Obstetrics                           Anesthesia Physical Anesthesia Plan  ASA: III  Anesthesia Plan: MAC   Post-op Pain Management:    Induction: Intravenous  Airway Management Planned: Nasal Cannula  Additional Equipment:   Intra-op Plan:   Post-operative Plan:   Informed Consent: I have reviewed the patients History and Physical, chart, labs and discussed the procedure including the risks, benefits and alternatives for the proposed anesthesia with the patient or authorized representative who has indicated his/her understanding and acceptance.     Plan Discussed with:   Anesthesia Plan Comments:         Anesthesia Quick Evaluation

## 2013-02-28 NOTE — Transfer of Care (Signed)
Immediate Anesthesia Transfer of Care Note  Patient: [name]  Procedure(s) Performed: Procedure(s): CATARACT EXTRACTION PHACO AND INTRAOCULAR LENS PLACEMENT (IOL) CDE=15.60 (Left)  Patient Location: Short Stay  Anesthesia Type:MAC  Level of Consciousness: awake  Airway & Oxygen Therapy: Patient Spontanous Breathing  Post-op Assessment: Report given to PACU RN  Post vital signs: Reviewed  Complications: No apparent anesthesia complications

## 2013-02-28 NOTE — Anesthesia Postprocedure Evaluation (Signed)
  Anesthesia Post-op Note  Patient: [name]  Procedure(s) Performed: Procedure(s): CATARACT EXTRACTION PHACO AND INTRAOCULAR LENS PLACEMENT (IOL) CDE=15.60 (Left)  Patient Location: Short Stay  Anesthesia Type:MAC  Level of Consciousness: awake, alert  and oriented  Airway and Oxygen Therapy: Patient Spontanous Breathing  Post-op Pain: none  Post-op Assessment: Post-op Vital signs reviewed, Patient's Cardiovascular Status Stable, Respiratory Function Stable, Patent Airway and No signs of Nausea or vomiting  Post-op Vital Signs: Reviewed and stable  Complications: No apparent anesthesia complications

## 2013-02-28 NOTE — Op Note (Signed)
Patient brought to the operating room and prepped and draped in the usual manner.  Lid speculum inserted in left eye.  Stab incision made at the twelve o'clock position.  Provisc instilled in the anterior chamber.   A 2.4 mm. Stab incision was made temporally.  An anterior capsulotomy was done with a bent 25 gauge needle.  The nucleus was hydrodissected.  The Phaco tip was inserted in the anterior chamber and the nucleus was emulsified.  CDE was 15.60.  The cortical material was then removed with the I and A tip.  Posterior capsule was the polished.  The anterior chamber was deepened with Provisc.  A 19.5 Diopter Rayner 570C IOL was then inserted in the capsular bag.  Provisc was then removed with the I and A tip.  The wound was then hydrated.  Patient sent to the Recovery Room in good condition with follow up in my office.  Preoperative Diagnosis:  Nuclear Cataract OS Postoperative Diagnosis:  Same Procedure name: Kelman Phacoemulsification Os with IOL

## 2013-02-28 NOTE — H&P (Signed)
The patient was re examined and there is no change in the patients condition since the original H and P. 

## 2013-03-01 ENCOUNTER — Encounter (HOSPITAL_COMMUNITY): Payer: Self-pay | Admitting: Ophthalmology

## 2013-09-17 ENCOUNTER — Emergency Department (HOSPITAL_COMMUNITY): Payer: Medicare PPO

## 2013-09-17 ENCOUNTER — Encounter (HOSPITAL_COMMUNITY): Payer: Self-pay | Admitting: Emergency Medicine

## 2013-09-17 ENCOUNTER — Emergency Department (HOSPITAL_COMMUNITY)
Admission: EM | Admit: 2013-09-17 | Discharge: 2013-09-17 | Disposition: A | Discharge status: Home / Self Care | Payer: Medicare PPO | Attending: Emergency Medicine | Admitting: Emergency Medicine

## 2013-09-17 DIAGNOSIS — Z8719 Personal history of other diseases of the digestive system: Secondary | ICD-10-CM | POA: Insufficient documentation

## 2013-09-17 DIAGNOSIS — Z79899 Other long term (current) drug therapy: Secondary | ICD-10-CM | POA: Insufficient documentation

## 2013-09-17 DIAGNOSIS — Z794 Long term (current) use of insulin: Secondary | ICD-10-CM | POA: Insufficient documentation

## 2013-09-17 DIAGNOSIS — I10 Essential (primary) hypertension: Secondary | ICD-10-CM | POA: Insufficient documentation

## 2013-09-17 DIAGNOSIS — E119 Type 2 diabetes mellitus without complications: Secondary | ICD-10-CM | POA: Insufficient documentation

## 2013-09-17 DIAGNOSIS — M129 Arthropathy, unspecified: Secondary | ICD-10-CM | POA: Insufficient documentation

## 2013-09-17 DIAGNOSIS — R609 Edema, unspecified: Secondary | ICD-10-CM

## 2013-09-17 DIAGNOSIS — R252 Cramp and spasm: Secondary | ICD-10-CM | POA: Insufficient documentation

## 2013-09-17 DIAGNOSIS — R071 Chest pain on breathing: Secondary | ICD-10-CM | POA: Insufficient documentation

## 2013-09-17 LAB — URINALYSIS, ROUTINE W REFLEX MICROSCOPIC
Bilirubin Urine: NEGATIVE
Glucose, UA: 250 mg/dL — AB
Ketones, ur: NEGATIVE mg/dL
Leukocytes, UA: NEGATIVE
NITRITE: POSITIVE — AB
Protein, ur: NEGATIVE mg/dL
SPECIFIC GRAVITY, URINE: 1.015 (ref 1.005–1.030)
UROBILINOGEN UA: 0.2 mg/dL (ref 0.0–1.0)
pH: 6.5 (ref 5.0–8.0)

## 2013-09-17 LAB — BASIC METABOLIC PANEL
BUN: 18 mg/dL (ref 6–23)
CO2: 25 mEq/L (ref 19–32)
CREATININE: 1.12 mg/dL — AB (ref 0.50–1.10)
Calcium: 9 mg/dL (ref 8.4–10.5)
Chloride: 101 mEq/L (ref 96–112)
GFR, EST AFRICAN AMERICAN: 53 mL/min — AB (ref >90)
GFR, EST NON AFRICAN AMERICAN: 46 mL/min — AB (ref >90)
Glucose, Bld: 232 mg/dL — ABNORMAL HIGH (ref 70–99)
POTASSIUM: 4.1 meq/L (ref 3.7–5.3)
Sodium: 137 mEq/L (ref 137–147)

## 2013-09-17 LAB — URINE MICROSCOPIC-ADD ON

## 2013-09-17 MED ORDER — TRAMADOL HCL 50 MG PO TABS: 50.0000 mg | ORAL_TABLET | Freq: Four times a day (QID) | ORAL | Status: DC | PRN

## 2013-09-17 NOTE — ED Notes (Signed)
Pt c/o left side pain and LUQ pain that began last night. Pt denies injury. Pt also reports polyuria and nausea. Denies v/d. Denies SOB.

## 2013-09-17 NOTE — ED Notes (Signed)
Pt reports pain in left lower abd radiating into left ribs since last night.  Reports urinary frequency and nausea.  Pt also c/o muscle spasms in r leg.

## 2013-09-17 NOTE — ED Provider Notes (Signed)
CSN: [id number]     Arrival date & time 09/17/13  [id number] History  This chart was scribed for [employer]. Alvino Chapel, MD by Roxan Diesel, ED scribe.  This patient was seen in room APA03/APA03 and the patient's care was started at 8:15 AM.   Chief Complaint  Patient presents with  . Abdominal Pain  . Leg Pain    The history is provided by the patient. No language interpreter was used.    HPI Comments: [name] is a 79 y.o. female who presents to the Emergency Department complaining of recurrent cramping left-sided rib pain that began last night.   Pt states she has similar pain frequently and it is typically brought on by certain twisting and turning movements.  She denies any specific injury.  She has never been seen for her pain before and is not on any medications for it.  Pain does not radiate into legs and is not associated with eating.  Pt also does also note pain and swelling in her right lower leg which she states is chronic and she has never been seen for this.    Past Medical History  Diagnosis Date  . Diabetes mellitus without complication   . Hypertension   . GERD (gastroesophageal reflux disease)   . Arthritis     Past Surgical History  Procedure Laterality Date  . Cholecystectomy    . Back surgery    . Joint replacement      Rt hip  . Cataract extraction w/phaco Left 02/28/2013    Procedure: CATARACT EXTRACTION PHACO AND INTRAOCULAR LENS PLACEMENT (IOL) CDE=15.60;  Surgeon: Elta Guadeloupe T. Gershon Crane, MD;  Location: [facility];  Service: Ophthalmology;  Laterality: Left;    No family history on file.   History  Substance Use Topics  . Smoking status: Never Smoker   . Smokeless tobacco: Not on file  . Alcohol Use: No    OB History   Grav Para Term Preterm Abortions TAB SAB Ect Mult Living                   Review of Systems  Cardiovascular: Positive for chest pain (left ribs).  Musculoskeletal: Positive for myalgias (right lower leg).       Right lower leg  swelling  All other systems reviewed and are negative.     Allergies  Review of patient's allergies indicates no known allergies.  Home Medications   Prior to Admission medications   Medication Sig Start Date End Date Taking? Authorizing Provider  aspirin EC 81 MG tablet Take 81 mg by mouth every other day.    Historical Provider, MD  atorvastatin (LIPITOR) 10 MG tablet Take 10 mg by mouth daily.    Historical Provider, MD  insulin detemir (LEVEMIR) 100 UNIT/ML injection Inject 50 Units into the skin at bedtime.    Historical Provider, MD  insulin lispro (HUMALOG) 100 UNIT/ML injection Inject 20 Units into the skin 3 (three) times daily before meals.    Historical Provider, MD  valsartan-hydrochlorothiazide (DIOVAN-HCT) 320-25 MG per tablet Take 1 tablet by mouth daily.    Historical Provider, MD   BP 127/94  Pulse 103  Temp(Src) 98.3 F (36.8 C)  Resp 20  Ht 5\' 7"  (1.702 m)  Wt 207 lb (93.895 kg)  BMI 32.41 kg/m2  SpO2 97%  Physical Exam  Nursing note and vitals reviewed. Constitutional: She is oriented to person, place, and time. She appears well-developed and well-nourished. No distress.  HENT:  Head: Normocephalic  and atraumatic.  Eyes: EOM are normal.  Neck: Neck supple. No tracheal deviation present.  Cardiovascular: Normal rate.   Pulmonary/Chest: Effort normal and breath sounds normal. No respiratory distress. She has no wheezes. She has no rales. She exhibits tenderness (right lower lateral chest wall, no rash).  Abdominal: There is no tenderness. There is no CVA tenderness.  Musculoskeletal: Normal range of motion. She exhibits edema.  Pitting edema to bilateral lower extremities, worse on right side  Neurological: She is alert and oriented to person, place, and time.  Face symmetric  Skin: Skin is warm and dry.  Chronic venous changes over right ankle area   Psychiatric: She has a normal mood and affect. Her behavior is normal.    ED Course  Procedures  (including critical care time)  DIAGNOSTIC STUDIES: Oxygen Saturation is 97% on room air, normal by my interpretation.    COORDINATION OF CARE: 8:20 AM-Discussed treatment plan which includes x-ray with pt at bedside and pt agreed to plan.     Labs Review Labs Reviewed  URINALYSIS, ROUTINE W REFLEX MICROSCOPIC - Abnormal; Notable for the following:    APPearance CLOUDY (*)    Glucose, UA 250 (*)    Hgb urine dipstick SMALL (*)    Nitrite POSITIVE (*)    All other components within normal limits  BASIC METABOLIC PANEL - Abnormal; Notable for the following:    Glucose, Bld 232 (*)    Creatinine, Ser 1.12 (*)    GFR calc non Af Amer 46 (*)    GFR calc Af Amer 53 (*)    All other components within normal limits  URINE MICROSCOPIC-ADD ON - Abnormal; Notable for the following:    Squamous Epithelial / LPF FEW (*)    Bacteria, UA MANY (*)    All other components within normal limits  URINE CULTURE    Imaging Review US Venous Img Lower Bilateral  09/18/2013   CLINICAL DATA:  Left leg swelling  EXAM: LEFT LOWER EXTREMITY VENOUS DOPPLER ULTRASOUND  TECHNIQUE: Gray-scale sonography with graded compression, as well as color Doppler and duplex ultrasound were performed to evaluate the lower extremity deep venous systems from the level of the common femoral vein and including the common femoral, femoral, profunda femoral, popliteal and calf veins including the posterior tibial, peroneal and gastrocnemius veins when visible. The superficial great saphenous vein was also interrogated. Spectral Doppler was utilized to evaluate flow at rest and with distal augmentation maneuvers in the common femoral, femoral and popliteal veins.  COMPARISON:  None.  FINDINGS: Common Femoral Vein: No evidence of thrombus. Normal compressibility, respiratory phasicity and response to augmentation.  Saphenofemoral Junction: No evidence of thrombus. Normal compressibility and flow on color Doppler imaging.  Profunda  Femoral Vein: No evidence of thrombus. Normal compressibility and flow on color Doppler imaging.  Femoral Vein: No evidence of thrombus. Normal compressibility, respiratory phasicity and response to augmentation.  Popliteal Vein: No evidence of thrombus. Normal compressibility, respiratory phasicity and response to augmentation.  Calf Veins: No evidence of thrombus. Normal compressibility and flow on color Doppler imaging.  Superficial Great Saphenous Vein: No evidence of thrombus. Normal compressibility and flow on color Doppler imaging.  Venous Reflux:  None.  Other Findings:  None.  IMPRESSION: No evidence of deep venous thrombosis.   Electronically Signed   By: Jacqulynn Cadet M.D.   On: 09/18/2013 15:06     EKG Interpretation None      MDM   Final diagnoses:  Muscle cramps  Peripheral edema    Patient with cramping pain diffusely. Chronic leg swelling,. Pain worse with movement. CXR reassuring. dopper tomorrow.    I personally performed the services described in this documentation, which was scribed in my presence. The recorded information has been reviewed and is accurate.     Jasper Riling. Alvino Chapel, MD 09/19/13 1228

## 2013-09-18 ENCOUNTER — Ambulatory Visit (HOSPITAL_COMMUNITY)
Admit: 2013-09-18 | Discharge: 2013-09-18 | Disposition: A | Discharge status: Home / Self Care | Payer: Medicare HMO | Attending: Emergency Medicine | Admitting: Emergency Medicine

## 2013-09-18 ENCOUNTER — Other Ambulatory Visit (HOSPITAL_COMMUNITY): Payer: Medicare HMO

## 2013-09-18 ENCOUNTER — Other Ambulatory Visit (HOSPITAL_COMMUNITY): Payer: Self-pay | Admitting: Emergency Medicine

## 2013-09-18 DIAGNOSIS — M7989 Other specified soft tissue disorders: Secondary | ICD-10-CM

## 2013-09-18 DIAGNOSIS — R609 Edema, unspecified: Secondary | ICD-10-CM | POA: Insufficient documentation

## 2013-09-18 NOTE — Discharge Instructions (Signed)
Your ultrasound does NOT show a blood clot. Elevate your legs above your heart several times per day. You can wear Compression Hose (available at any major pharmacy) to help with the swelling. Continue your lasix. Re check with Dr. Legrand Rams.  Peripheral Edema You have swelling in your legs (peripheral edema). This swelling is due to excess accumulation of salt and water in your body. Edema may be a sign of heart, kidney or liver disease, or a side effect of a medication. It may also be due to problems in the leg veins. Elevating your legs and using special support stockings may be very helpful, if the cause of the swelling is due to poor venous circulation. Avoid long periods of standing, whatever the cause. Treatment of edema depends on identifying the cause. Chips, pretzels, pickles and other salty foods should be avoided. Restricting salt in your diet is almost always needed. Water pills (diuretics) are often used to remove the excess salt and water from your body via urine. These medicines prevent the kidney from reabsorbing sodium. This increases urine flow. Diuretic treatment may also result in lowering of potassium levels in your body. Potassium supplements may be needed if you have to use diuretics daily. Daily weights can help you keep track of your progress in clearing your edema. You should call your caregiver for follow up care as recommended. SEEK IMMEDIATE MEDICAL CARE IF:   You have increased swelling, pain, redness, or heat in your legs.  You develop shortness of breath, especially when lying down.  You develop chest or abdominal pain, weakness, or fainting.  You have a fever. Document Released: 06/11/2004 Document Revised: 07/27/2011 Document Reviewed: 05/22/2009 [facility] Patient Information 2014 [place].

## 2013-09-19 LAB — URINE CULTURE: Colony Count: >=100000

## 2013-09-20 ENCOUNTER — Telehealth (HOSPITAL_BASED_OUTPATIENT_CLINIC_OR_DEPARTMENT_OTHER): Payer: Self-pay | Admitting: Emergency Medicine

## 2013-09-20 NOTE — Telephone Encounter (Signed)
Post ED Visit - Positive Culture Follow-up  Culture report reviewed by antimicrobial stewardship pharmacist: []  Wes Tetherow, Pharm.D., BCPS []  Heide Guile, Pharm.D., BCPS []  Alycia Rossetti, Pharm.D., BCPS []  [place], Pharm.D., BCPS, AAHIVP []  Legrand Como, Pharm.D., BCPS, AAHIVP []  Juliene Pina, Pharm.D. [x]  Jeronimo Norma, Pharm.D.  Positive urine culture Per Noland Fordyce PA-C, no treatment needed and no further patient follow-up is required at this time.  [place] 09/20/2013, 2:55 PM

## 2013-12-27 ENCOUNTER — Telehealth: Payer: Self-pay | Admitting: *Deleted

## 2013-12-27 NOTE — Telephone Encounter (Signed)
Pt called to schedule her TCS. Please advise [phone]

## 2013-12-27 NOTE — Telephone Encounter (Signed)
Pt received a letter, she was on the recall for next colonoscopy. Her last one was 12/12/2003 by Dr. [name] and next was recommended in 10 years.   She is having some constipation and hemorrhoids.  OV with Laban Emperor, NP on 01/29/2014 at 8:30 Am.

## 2014-01-26 ENCOUNTER — Other Ambulatory Visit (HOSPITAL_COMMUNITY): Payer: Self-pay | Admitting: Internal Medicine

## 2014-01-26 DIAGNOSIS — Z1231 Encounter for screening mammogram for malignant neoplasm of breast: Secondary | ICD-10-CM

## 2014-01-29 ENCOUNTER — Ambulatory Visit: Payer: Medicare HMO | Admitting: Gastroenterology

## 2014-02-01 ENCOUNTER — Ambulatory Visit (HOSPITAL_COMMUNITY): Payer: Medicare HMO

## 2014-02-28 ENCOUNTER — Other Ambulatory Visit: Payer: Self-pay

## 2014-02-28 ENCOUNTER — Ambulatory Visit (INDEPENDENT_AMBULATORY_CARE_PROVIDER_SITE_OTHER): Payer: Commercial Managed Care - HMO | Admitting: Gastroenterology

## 2014-02-28 ENCOUNTER — Encounter: Payer: Self-pay | Admitting: Gastroenterology

## 2014-02-28 VITALS — BP 159/76 | HR 90 | Temp 97.5°F | Ht 66.0 in | Wt 210.4 lb

## 2014-02-28 DIAGNOSIS — R1314 Dysphagia, pharyngoesophageal phase: Secondary | ICD-10-CM

## 2014-02-28 DIAGNOSIS — R1310 Dysphagia, unspecified: Secondary | ICD-10-CM | POA: Insufficient documentation

## 2014-02-28 DIAGNOSIS — K5900 Constipation, unspecified: Secondary | ICD-10-CM | POA: Insufficient documentation

## 2014-02-28 DIAGNOSIS — R1319 Other dysphagia: Secondary | ICD-10-CM

## 2014-02-28 DIAGNOSIS — K5909 Other constipation: Secondary | ICD-10-CM

## 2014-02-28 DIAGNOSIS — K625 Hemorrhage of anus and rectum: Secondary | ICD-10-CM

## 2014-02-28 DIAGNOSIS — K219 Gastro-esophageal reflux disease without esophagitis: Secondary | ICD-10-CM

## 2014-02-28 MED ORDER — LUBIPROSTONE 8 MCG PO CAPS: 8.0000 ug | ORAL_CAPSULE | Freq: Two times a day (BID) | ORAL | Status: DC

## 2014-02-28 MED ORDER — LANSOPRAZOLE 30 MG PO CPDR
30.0000 mg | DELAYED_RELEASE_CAPSULE | Freq: Every day | ORAL | Status: DC
Start: 2014-02-28 — End: 2014-02-28

## 2014-02-28 MED ORDER — LANSOPRAZOLE 30 MG PO CPDR
30.0000 mg | DELAYED_RELEASE_CAPSULE | Freq: Every day | ORAL | Status: DC
Start: 2014-02-28 — End: 2015-05-08

## 2014-02-28 MED ORDER — PEG 3350-KCL-NA BICARB-NACL 420 G PO SOLR: 4000.0000 mL | ORAL | Status: DC

## 2014-02-28 NOTE — Assessment & Plan Note (Signed)
Chronic, intermittent reflux symptoms. Complains of pill and solid food dysphagia. Remote endoscopy as outlined previously. Offered upper endoscopy with possible esophageal dilation for suspected esophageal ring. I have discussed the risks, alternatives, benefits with regards to but not limited to the risk of reaction to medication, bleeding, infection, perforation and the patient is agreeable to proceed. Written consent to be obtained.  Will start pantoprazole 30 mg daily. Prescription sent to pharmacy.

## 2014-02-28 NOTE — Progress Notes (Signed)
cc'ed to pcp °

## 2014-02-28 NOTE — Progress Notes (Signed)
Primary Care Physician:  [name], MD  Primary Gastroenterologist:  Garfield Cornea, MD   Chief Complaint  Patient presents with  . Colonoscopy    HPI:  [name] is a 79 y.o. female here to schedule 10 year followup colonoscopy. She complains of significant constipation since she was asked to come in to be seen. Chronic constipation, bristol 1. Has to strain a lot. Worse after stroke. Vicodin prn only. BM 1-2 per week. Some brbpr on toilet tissue. No melena. Some abdominal discomfort with the constipation. No laxatives. Some regurgitation/with burning. Previously was on medication but did not have it refilled. Cannot recall the name. Some dysphagia to pills/solid foods, happens quite often. No unintentional weight loss, anorexia, vomiting.   Current Outpatient Prescriptions  Medication Sig Dispense Refill  . gabapentin (NEURONTIN) 300 MG capsule Take 300 mg by mouth 2 (two) times daily.       Marland Kitchen HYDROcodone-acetaminophen (NORCO/VICODIN) 5-325 MG per tablet Take 1 tablet by mouth every 6 (six) hours as needed.       . insulin detemir (LEVEMIR) 100 UNIT/ML injection Inject 80 Units into the skin at bedtime.       . insulin lispro (HUMALOG) 100 UNIT/ML injection Inject 20 Units into the skin 3 (three) times daily before meals.      Marland Kitchen loratadine (CLARITIN) 10 MG tablet Take 10 mg by mouth 2 (two) times daily.      . valsartan (DIOVAN) 320 MG tablet Take 1 tablet by mouth daily.      . furosemide (LASIX) 40 MG tablet Take 1 tablet by mouth daily.       No current facility-administered medications for this visit.    Allergies as of 02/28/2014  . (No Known Allergies)    Past Medical History  Diagnosis Date  . Diabetes mellitus without complication   . Hypertension   . GERD (gastroesophageal reflux disease)   . Arthritis   . CVA (cerebral infarction)     Past Surgical History  Procedure Laterality Date  . Cholecystectomy    . Back surgery    . Joint replacement      Rt hip,  ????? sounds like just for fracture  . Cataract extraction w/phaco Left 02/28/2013    Procedure: CATARACT EXTRACTION PHACO AND INTRAOCULAR LENS PLACEMENT (IOL) CDE=15.60;  Surgeon: Elta Guadeloupe T. Gershon Crane, MD;  Location: [facility];  Service: Ophthalmology;  Laterality: Left;  . Colonoscopy  12/12/2003    RMR: Normal rectum.  Long capacious tortuous colon, colonic mucosa appeared normal  . Esophagogastroduodenoscopy  12/28/2001    [id number] sliding hiatal hernia/. Three small bulbar ulcers, two with stigmata of bleeding and these were coagulated using heater probe unit   . Hip fracture surgery  2008    Family History  Problem Relation Age of Onset  . Crohn's disease Daughter   . Colon cancer Neg Hx     History   Social History  . Marital Status: Widowed    Spouse Name: N/A    Number of Children: 8  . Years of Education: N/A   Occupational History  . Not on file.   Social History Main Topics  . Smoking status: Never Smoker   . Smokeless tobacco: Not on file  . Alcohol Use: No  . Drug Use: No  . Sexual Activity: Not on file   Other Topics Concern  . Not on file   Social History Narrative  . No narrative on file      ROS:  General: Negative  for anorexia, weight loss, fever, chills, fatigue, weakness. Eyes: Negative for vision changes.  ENT: Negative for hoarseness, difficulty swallowing , nasal congestion. CV: Negative for chest pain, angina, palpitations, dyspnea on exertion, peripheral edema.  Respiratory: Negative for dyspnea at rest, dyspnea on exertion, cough, sputum, wheezing.  GI: See history of present illness. GU:  Negative for dysuria, hematuria, urinary incontinence, urinary frequency, nocturnal urination.  MS: Negative for joint pain, low back pain.  Derm: Negative for rash or itching.  Neuro: Negative for weakness, abnormal sensation, seizure, frequent headaches, memory loss, confusion.  Psych: Negative for anxiety, depression, suicidal ideation, hallucinations.   Endo: Negative for unusual weight change.  Heme: Negative for bruising or bleeding. Allergy: Negative for rash or hives.    Physical Examination:  BP 159/76  Pulse 90  Temp(Src) 97.5 F (36.4 C) (Oral)  Ht 5\' 6"  (1.676 m)  Wt 210 lb 6.4 oz (95.437 kg)  BMI 33.98 kg/m2   General: Well-nourished, well-developed in no acute distress.  Head: Normocephalic, atraumatic.   Eyes: Conjunctiva pink, no icterus. Mouth: Oropharyngeal mucosa moist and pink , no lesions erythema or exudate. Neck: Supple without thyromegaly, masses, or lymphadenopathy.  Lungs: Clear to auscultation bilaterally.  Heart: Regular rate and rhythm, no murmurs rubs or gallops.  Abdomen: Bowel sounds are normal, nontender, nondistended, no hepatosplenomegaly or masses, no abdominal bruits or    hernia , no rebound or guarding.   Rectal: Not performed Extremities: 2+ pitting edema at the ankles bilaterally. No clubbing or deformities.  Neuro: Alert and oriented x 4 , grossly normal neurologically.  Skin: Warm and dry, no rash or jaundice.   Psych: Alert and cooperative, normal mood and affect.

## 2014-02-28 NOTE — Assessment & Plan Note (Signed)
79 year old lady with history of chronic constipation, intermittent bright red blood per rectum. Last colonoscopy 2005. No family history of colon cancer or personal history of colon polyps. Due for colonoscopy at this time. Suspect benign anorectal bleeding. I have discussed the risks, alternatives, benefits with regards to but not limited to the risk of reaction to medication, bleeding, infection, perforation and the patient is agreeable to proceed. Written consent to be obtained.  For constipation Will start Amitiza 8 mcg twice a day. Samples and prescription provided.

## 2014-02-28 NOTE — Patient Instructions (Addendum)
1. Colonoscopy as planned. See separate instructions.  2. Start lansoprazole once daily before breakfast for acid reflux. RX sent to [facility]. 3. Start Amitiza one with breakfast and one with supper for constipation. Samples and RX sent to [facility].

## 2014-03-06 ENCOUNTER — Encounter (HOSPITAL_COMMUNITY): Payer: Self-pay | Admitting: Pharmacy Technician

## 2014-03-21 ENCOUNTER — Encounter (HOSPITAL_COMMUNITY): Payer: Self-pay | Admitting: *Deleted

## 2014-03-21 ENCOUNTER — Encounter (HOSPITAL_COMMUNITY)
Admission: RE | Disposition: A | Discharge status: Home / Self Care | Payer: Self-pay | Source: Ambulatory Visit | Attending: Internal Medicine

## 2014-03-21 ENCOUNTER — Ambulatory Visit (HOSPITAL_COMMUNITY)
Admission: RE | Admit: 2014-03-21 | Discharge: 2014-03-21 | Disposition: A | Discharge status: Home / Self Care | Payer: Medicare PPO | Source: Ambulatory Visit | Attending: Internal Medicine | Admitting: Internal Medicine

## 2014-03-21 DIAGNOSIS — K219 Gastro-esophageal reflux disease without esophagitis: Secondary | ICD-10-CM | POA: Diagnosis present

## 2014-03-21 DIAGNOSIS — R1319 Other dysphagia: Secondary | ICD-10-CM | POA: Insufficient documentation

## 2014-03-21 DIAGNOSIS — K648 Other hemorrhoids: Secondary | ICD-10-CM | POA: Insufficient documentation

## 2014-03-21 DIAGNOSIS — Q438 Other specified congenital malformations of intestine: Secondary | ICD-10-CM | POA: Diagnosis not present

## 2014-03-21 DIAGNOSIS — I10 Essential (primary) hypertension: Secondary | ICD-10-CM | POA: Insufficient documentation

## 2014-03-21 DIAGNOSIS — R1314 Dysphagia, pharyngoesophageal phase: Secondary | ICD-10-CM | POA: Insufficient documentation

## 2014-03-21 DIAGNOSIS — K644 Residual hemorrhoidal skin tags: Secondary | ICD-10-CM | POA: Insufficient documentation

## 2014-03-21 DIAGNOSIS — K5900 Constipation, unspecified: Secondary | ICD-10-CM | POA: Diagnosis not present

## 2014-03-21 DIAGNOSIS — Q394 Esophageal web: Secondary | ICD-10-CM | POA: Diagnosis not present

## 2014-03-21 DIAGNOSIS — Z79899 Other long term (current) drug therapy: Secondary | ICD-10-CM | POA: Insufficient documentation

## 2014-03-21 DIAGNOSIS — K2960 Other gastritis without bleeding: Secondary | ICD-10-CM

## 2014-03-21 DIAGNOSIS — K2950 Unspecified chronic gastritis without bleeding: Secondary | ICD-10-CM | POA: Insufficient documentation

## 2014-03-21 DIAGNOSIS — E118 Type 2 diabetes mellitus with unspecified complications: Secondary | ICD-10-CM | POA: Insufficient documentation

## 2014-03-21 DIAGNOSIS — K921 Melena: Secondary | ICD-10-CM | POA: Insufficient documentation

## 2014-03-21 DIAGNOSIS — Z794 Long term (current) use of insulin: Secondary | ICD-10-CM | POA: Insufficient documentation

## 2014-03-21 DIAGNOSIS — R1310 Dysphagia, unspecified: Secondary | ICD-10-CM

## 2014-03-21 DIAGNOSIS — K5730 Diverticulosis of large intestine without perforation or abscess without bleeding: Secondary | ICD-10-CM | POA: Insufficient documentation

## 2014-03-21 HISTORY — PX: SAVORY DILATION: SHX5439

## 2014-03-21 HISTORY — PX: MALONEY DILATION: SHX5535

## 2014-03-21 HISTORY — PX: ESOPHAGOGASTRODUODENOSCOPY: SHX5428

## 2014-03-21 HISTORY — PX: COLONOSCOPY: SHX5424

## 2014-03-21 LAB — GLUCOSE, CAPILLARY: GLUCOSE-CAPILLARY: 144 mg/dL — AB (ref 70–99)

## 2014-03-21 SURGERY — COLONOSCOPY
Anesthesia: Moderate Sedation

## 2014-03-21 MED ORDER — MEPERIDINE HCL 100 MG/ML IJ SOLN
INTRAMUSCULAR | Status: AC
  Filled 2014-03-21: qty 2

## 2014-03-21 MED ORDER — MIDAZOLAM HCL 5 MG/5ML IJ SOLN
INTRAMUSCULAR | Status: DC | PRN
  Administered 2014-03-21 (×2): 1 mg via INTRAVENOUS
  Administered 2014-03-21: 2 mg via INTRAVENOUS
  Administered 2014-03-21 (×4): 1 mg via INTRAVENOUS

## 2014-03-21 MED ORDER — STERILE WATER FOR IRRIGATION IR SOLN
Status: DC | PRN
  Administered 2014-03-21: 08:00:00

## 2014-03-21 MED ORDER — MEPERIDINE HCL 100 MG/ML IJ SOLN
INTRAMUSCULAR | Status: DC | PRN
  Administered 2014-03-21 (×3): 25 mg via INTRAVENOUS

## 2014-03-21 MED ORDER — LIDOCAINE VISCOUS 2 % MT SOLN
OROMUCOSAL | Status: DC | PRN
  Administered 2014-03-21: 4 mL via OROMUCOSAL

## 2014-03-21 MED ORDER — LIDOCAINE VISCOUS 2 % MT SOLN
OROMUCOSAL | Status: AC
  Filled 2014-03-21: qty 15

## 2014-03-21 MED ORDER — SODIUM CHLORIDE 0.9 % IV SOLN
INTRAVENOUS | Status: DC
  Administered 2014-03-21: 1000 mL via INTRAVENOUS

## 2014-03-21 MED ORDER — ONDANSETRON HCL 4 MG/2ML IJ SOLN
INTRAMUSCULAR | Status: DC | PRN
  Administered 2014-03-21: 4 mg via INTRAVENOUS

## 2014-03-21 MED ORDER — ONDANSETRON HCL 4 MG/2ML IJ SOLN
INTRAMUSCULAR | Status: AC
  Filled 2014-03-21: qty 2

## 2014-03-21 MED ORDER — MIDAZOLAM HCL 5 MG/5ML IJ SOLN
INTRAMUSCULAR | Status: AC
  Filled 2014-03-21: qty 10

## 2014-03-21 NOTE — Op Note (Addendum)
[facility] [address] [place], [address]   ENDOSCOPY PROCEDURE REPORT  PATIENT: Caitlin, [name]  MR#: [id number] BIRTHDATE: 07/16/1934 , 79  yrs. old GENDER: female ENDOSCOPIST: R.  Garfield Cornea, MD FACP FACG REFERRED BY:  Conni Slipper, M.D. PROCEDURE DATE:  17-Apr-2014 PROCEDURE:  EGD w/ biopsy and Maloney dilation of esophagus INDICATIONS:  GERD; esophageal dysphagia. MEDICATIONS: Versed 4 mg IV and Demerol 50 mg IV in divided doses. Zofran 4 mg IV.  Xylocaine gel orally ASA CLASS:      Class II  CONSENT: The risks, benefits, limitations, alternatives and imponderables have been discussed.  The potential for biopsy, esophogeal dilation, etc. have also been reviewed.  Questions have been answered.  All parties agreeable.  Please see the history and physical in the medical record for more information.  DESCRIPTION OF PROCEDURE: After the risks benefits and alternatives of the procedure were thoroughly explained, informed consent was obtained.  The [id number] endoscope was introduced through the mouth and advanced to the second portion of the duodenum , limited by Without limitations. The instrument was slowly withdrawn as the mucosa was fully examined.    Probable subtle cervical web; otherwise normal-appearing patent tubular esophagus.  Stomach empty.Gastric mucosa abnormal with diffuse somewhat "reticulated" appearance.  No ulcer or infiltrating process.  Patent pylorus.  Examination of the bulb and second portion revealed mild mild narrowing at the junction of the first and second portion (the overlying mucosa appeared entirely normal, however).  Retroflexed views revealed as previously described.    Scope was removed. A 56 French Maloney dilators passed with one surgeon easily. They look back revealed the way up had been ruptured without apparent complication. There was minimal bleeding. Subsequently, biopsies of the abnormal gastric  mucosa taken for histologic study The scope was then withdrawn from the patient and the procedure completed.  COMPLICATIONS: There were no immediate complications.  ENDOSCOPIC IMPRESSION: Cervical esophageal web -  status post dilation as described above. Abnormal gastric mucosa of uncertain significance?"status post gastric biopsy. Mild narrowing at the junction of D1 and D2 of doubtful clinical significance.  RECOMMENDATIONS: Follow up on pathology. See colonoscopy report.  REPEAT EXAM:  eSigned:  R. Garfield Cornea, MD Rosalita Chessman [facility] 2014-04-17 9:43 AM Revised: 04/17/14 9:43 AM   CC:  CPT CODES: ICD CODES:  The ICD and CPT codes recommended by this software are interpretations from the data that the clinical staff has captured with the software.  The verification of the translation of this report to the ICD and CPT codes and modifiers is the sole responsibility of the health care institution and practicing physician where this report was generated.  [place]. will not be held responsible for the validity of the ICD and CPT codes included on this report.  AMA assumes no liability for data contained or not contained herein. CPT is a Designer, television/film set of the [employer].  PATIENT NAME:  Caitlin, [name] MR#: [id number]

## 2014-03-21 NOTE — Op Note (Signed)
[facility] [address] [place], [address]   COLONOSCOPY PROCEDURE REPORT  PATIENT: [name], [name]  MR#: [id number] BIRTHDATE: 1935-02-15 , 79  yrs. old GENDER: female ENDOSCOPIST: R.  Garfield Cornea, MD FACP [facility] REFERRED [id number] Legrand Rams, M.D. PROCEDURE DATE:  04/20/2014 PROCEDURE: INDICATIONS:hematochezia. MEDICATIONS: Versed 8 mg IV and Demerol 75 mg IV in divided doses. Zofran 4 mg IV. ASA CLASS:       Class II  CONSENT: The risks, benefits, alternatives and imponderables including but not limited to bleeding, perforation as well as the possibility of a missed lesion have been reviewed.  The potential for biopsy, lesion removal, etc. have also been discussed. Questions have been answered.  All parties agreeable.  Please see the history and physical in the medical record for more information.  DESCRIPTION OF PROCEDURE:   After the risks benefits and alternatives of the procedure were thoroughly explained, informed consent was obtained.  The digital rectal exam      The [id number]  endoscope was introduced through the anus and advanced to the cecum, which was identified by both the appendix and ileocecal valve. No adverse events experienced.   The quality of the prep was adequate.  The instrument was then slowly withdrawn as the colon was fully examined.      COLON FINDINGS: External and internal hemorrhoids; otherwise, normal rectum.  Tortuous colon requiring a number of position changes  and applying external abdominal pressure to reach the cecum.  Patient scattered left-sided diverticula.  There is question of a 3 mm polyp in between 2 folds just distal to the ileocecal valve however, this could not be confirmed; I moved the folds around with the biopsy forceps - could not find a polyp, ultimately.  The remainder the colonic mucosa appeared normal.  Retroflexion was performed. .  Withdrawal time=18 minutes 0 seconds.  The scope was  withdrawn and the procedure completed. COMPLICATIONS: There were no immediate complications.  ENDOSCOPIC IMPRESSION: Internal and external hemorrhoids?"likely source of hematochezia. Tortuous colon. Colonic diverticulosis  RECOMMENDATIONS: continue Amitiza for constipation. Add Benefiber 2 teaspoons twice daily. See EGD report.  eSigned:  R. Garfield Cornea, MD FACP [facility] 04/20/2014 8:56 AM   cc:  CPT CODES: ICD CODES:  The ICD and CPT codes recommended by this software are interpretations from the data that the clinical staff has captured with the software.  The verification of the translation of this report to the ICD and CPT codes and modifiers is the sole responsibility of the health care institution and practicing physician where this report was generated.  [place]. will not be held responsible for the validity of the ICD and CPT codes included on this report.  AMA assumes no liability for data contained or not contained herein. CPT is a Designer, television/film set of the [employer].  PATIENT NAME:  Katrece, Sickler MR#: [id number]

## 2014-03-21 NOTE — Discharge Instructions (Addendum)
Colonoscopy Discharge Instructions  Read the instructions outlined below and refer to this sheet in the next few weeks. These discharge instructions provide you with general information on caring for yourself after you leave the hospital. Your doctor may also give you specific instructions. While your treatment has been planned according to the most current medical practices available, unavoidable complications occasionally occur. If you have any problems or questions after discharge, call Dr. [name] at [phone]. ACTIVITY  You may resume your regular activity, but move at a slower pace for the next 24 hours.   Take frequent rest periods for the next 24 hours.   Walking will help get rid of the air and reduce the bloated feeling in your belly (abdomen).   No driving for 24 hours (because of the medicine (anesthesia) used during the test).    Do not sign any important legal documents or operate any machinery for 24 hours (because of the anesthesia used during the test).  NUTRITION  Drink plenty of fluids.   You may resume your normal diet as instructed by your doctor.   Begin with a light meal and progress to your normal diet. Heavy or fried foods are harder to digest and may make you feel sick to your stomach (nauseated).   Avoid alcoholic beverages for 24 hours or as instructed.  MEDICATIONS  You may resume your normal medications unless your doctor tells you otherwise.  WHAT YOU CAN EXPECT TODAY  Some feelings of bloating in the abdomen.   Passage of more gas than usual.   Spotting of blood in your stool or on the toilet paper.  IF YOU HAD POLYPS REMOVED DURING THE COLONOSCOPY:  No aspirin products for 7 days or as instructed.   No alcohol for 7 days or as instructed.   Eat a soft diet for the next 24 hours.  FINDING OUT THE RESULTS OF YOUR TEST Not all test results are available during your visit. If your test results are not back during the visit, make an appointment  with your caregiver to find out the results. Do not assume everything is normal if you have not heard from your caregiver or the medical facility. It is important for you to follow up on all of your test results.  SEEK IMMEDIATE MEDICAL ATTENTION IF:  You have more than a spotting of blood in your stool.   Your belly is swollen (abdominal distention).   You are nauseated or vomiting.   You have a temperature over 101.  You have abdominal pain or discomfort that is severe or gets worse throughout the day. EGD Discharge instructions Please read the instructions outlined below and refer to this sheet in the next few weeks. These discharge instructions provide you with general information on caring for yourself after you leave the hospital. Your doctor may also give you specific instructions. While your treatment has been planned according to the most current medical practices available, unavoidable complications occasionally occur. If you have any problems or questions after discharge, please call your doctor. ACTIVITY You may resume your regular activity but move at a slower pace for the next 24 hours.  Take frequent rest periods for the next 24 hours.  Walking will help expel (get rid of) the air and reduce the bloated feeling in your abdomen.  No driving for 24 hours (because of the anesthesia (medicine) used during the test).  You may shower.  Do not sign any important legal documents or operate any machinery for 24  hours (because of the anesthesia used during the test).  NUTRITION Drink plenty of fluids.  You may resume your normal diet.  Begin with a light meal and progress to your normal diet.  Avoid alcoholic beverages for 24 hours or as instructed by your caregiver.  MEDICATIONS You may resume your normal medications unless your caregiver tells you otherwise.  WHAT YOU CAN EXPECT TODAY You may experience abdominal discomfort such as a feeling of fullness or gas pains.   FOLLOW-UP Your doctor will discuss the results of your test with you.  SEEK IMMEDIATE MEDICAL ATTENTION IF ANY OF THE FOLLOWING OCCUR: Excessive nausea (feeling sick to your stomach) and/or vomiting.  Severe abdominal pain and distention (swelling).  Trouble swallowing.  Temperature over 101 F (37.8 C).  Rectal bleeding or vomiting of blood.   Continue Prevacid 30 mg daily  Continue Amitiza twice daily; add Benefiber 2 teaspoons twice daily  Diverticulosis, hemorrhoids and GERD information provided  Further recommendations to follow pending review of pathology report  Colonoscopy, Care After Refer to this sheet in the next few weeks. These instructions provide you with information on caring for yourself after your procedure. Your health care provider may also give you more specific instructions. Your treatment has been planned according to current medical practices, but problems sometimes occur. Call your health care provider if you have any problems or questions after your procedure. WHAT TO EXPECT AFTER THE PROCEDURE  After your procedure, it is typical to have the following:  A small amount of blood in your stool.  Moderate amounts of gas and mild abdominal cramping or bloating. HOME CARE INSTRUCTIONS  Do not drive, operate machinery, or sign important documents for 24 hours.  You may shower and resume your regular physical activities, but move at a slower pace for the first 24 hours.  Take frequent rest periods for the first 24 hours.  Walk around or put a warm pack on your abdomen to help reduce abdominal cramping and bloating.  Drink enough fluids to keep your urine clear or pale yellow.  You may resume your normal diet as instructed by your health care provider. Avoid heavy or fried foods that are hard to digest.  Avoid drinking alcohol for 24 hours or as instructed by your health care provider.  Only take over-the-counter or prescription medicines as directed by  your health care provider.  If a tissue sample (biopsy) was taken during your procedure:  Do not take aspirin or blood thinners for 7 days, or as instructed by your health care provider.  Do not drink alcohol for 7 days, or as instructed by your health care provider.  Eat soft foods for the first 24 hours. SEEK MEDICAL CARE IF: You have persistent spotting of blood in your stool 2-3 days after the procedure. SEEK IMMEDIATE MEDICAL CARE IF:  You have more than a small spotting of blood in your stool.  You pass large blood clots in your stool.  Your abdomen is swollen (distended).  You have nausea or vomiting.  You have a fever.  You have increasing abdominal pain that is not relieved with medicine. Document Released: 12/17/2003 Document Revised: 02/22/2013 Document Reviewed: 01/09/2013 [facility] Patient Information 2015 [place], [place]. This information is not intended to replace advice given to you by your health care provider. Make sure you discuss any questions you have with your health care provider.  Esophagogastroduodenoscopy Care After Refer to this sheet in the next few weeks. These instructions provide you with information  on caring for yourself after your procedure. Your caregiver may also give you more specific instructions. Your treatment has been planned according to current medical practices, but problems sometimes occur. Call your caregiver if you have any problems or questions after your procedure.  HOME CARE INSTRUCTIONS  Do not eat or drink anything until the numbing medicine (local anesthetic) has worn off and your gag reflex has returned. You will know that the local anesthetic has worn off when you can swallow comfortably.  Do not drive for 12 hours after the procedure or as directed by your caregiver.  Only take medicines as directed by your caregiver. SEEK MEDICAL CARE IF:   You cannot stop coughing.  You are not urinating at all or less than usual. SEEK  IMMEDIATE MEDICAL CARE IF:  You have difficulty swallowing.  You cannot eat or drink.  You have worsening throat or chest pain.  You have dizziness, lightheadedness, or you faint.  You have nausea or vomiting.  You have chills.  You have a fever.  You have severe abdominal pain.  You have black, tarry, or bloody stools. Document Released: 04/20/2012 Document Reviewed: 04/20/2012 [facility] Patient Information 2015 [place]. This information is not intended to replace advice given to you by your health care provider. Make sure you discuss any questions you have with your health care provider.  Gastroesophageal Reflux Disease, Adult Gastroesophageal reflux disease (GERD) happens when acid from your stomach flows up into the esophagus. When acid comes in contact with the esophagus, the acid causes soreness (inflammation) in the esophagus. Over time, GERD may create small holes (ulcers) in the lining of the esophagus. CAUSES   Increased body weight. This puts pressure on the stomach, making acid rise from the stomach into the esophagus.  Smoking. This increases acid production in the stomach.  Drinking alcohol. This causes decreased pressure in the lower esophageal sphincter (valve or ring of muscle between the esophagus and stomach), allowing acid from the stomach into the esophagus.  Late evening meals and a full stomach. This increases pressure and acid production in the stomach.  A malformed lower esophageal sphincter. Sometimes, no cause is found. SYMPTOMS   Burning pain in the lower part of the mid-chest behind the breastbone and in the mid-stomach area. This may occur twice a week or more often.  Trouble swallowing.  Sore throat.  Dry cough.  Asthma-like symptoms including chest tightness, shortness of breath, or wheezing. DIAGNOSIS  Your caregiver may be able to diagnose GERD based on your symptoms. In some cases, X-rays and other tests may be done to check  for complications or to check the condition of your stomach and esophagus. TREATMENT  Your caregiver may recommend over-the-counter or prescription medicines to help decrease acid production. Ask your caregiver before starting or adding any new medicines.  HOME CARE INSTRUCTIONS   Change the factors that you can control. Ask your caregiver for guidance concerning weight loss, quitting smoking, and alcohol consumption.  Avoid foods and drinks that make your symptoms worse, such as:  Caffeine or alcoholic drinks.  Chocolate.  Peppermint or mint flavorings.  Garlic and onions.  Spicy foods.  Citrus fruits, such as oranges, lemons, or limes.  Tomato-based foods such as sauce, chili, salsa, and pizza.  Fried and fatty foods.  Avoid lying down for the 3 hours prior to your bedtime or prior to taking a nap.  Eat small, frequent meals instead of large meals.  Wear loose-fitting clothing. Do not wear anything  tight around your waist that causes pressure on your stomach.  Raise the head of your bed 6 to 8 inches with wood blocks to help you sleep. Extra pillows will not help.  Only take over-the-counter or prescription medicines for pain, discomfort, or fever as directed by your caregiver.  Do not take aspirin, ibuprofen, or other nonsteroidal anti-inflammatory drugs (NSAIDs). SEEK IMMEDIATE MEDICAL CARE IF:   You have pain in your arms, neck, jaw, teeth, or back.  Your pain increases or changes in intensity or duration.  You develop nausea, vomiting, or sweating (diaphoresis).  You develop shortness of breath, or you faint.  Your vomit is green, yellow, black, or looks like coffee grounds or blood.  Your stool is red, bloody, or black. These symptoms could be signs of other problems, such as heart disease, gastric bleeding, or esophageal bleeding. MAKE SURE YOU:   Understand these instructions.  Will watch your condition.  Will get help right away if you are not doing  well or get worse. Document Released: 02/11/2005 Document Revised: 07/27/2011 Document Reviewed: 11/21/2010 [facility] Patient Information 2015 [place], [place]. This information is not intended to replace advice given to you by your health care provider. Make sure you discuss any questions you have with your health care provider.  Diverticulosis Diverticulosis is the condition that develops when small pouches (diverticula) form in the wall of your colon. Your colon, or large intestine, is where water is absorbed and stool is formed. The pouches form when the inside layer of your colon pushes through weak spots in the outer layers of your colon. CAUSES  No one knows exactly what causes diverticulosis. RISK FACTORS  Being older than 60. Your risk for this condition increases with age. Diverticulosis is rare in people younger than 40 years. By age 11, almost everyone has it.  Eating a low-fiber diet.  Being frequently constipated.  Being overweight.  Not getting enough exercise.  Smoking.  Taking over-the-counter pain medicines, like aspirin and ibuprofen. SYMPTOMS  Most people with diverticulosis do not have symptoms. DIAGNOSIS  Because diverticulosis often has no symptoms, health care providers often discover the condition during an exam for other colon problems. In many cases, a health care provider will diagnose diverticulosis while using a flexible scope to examine the colon (colonoscopy). TREATMENT  If you have never developed an infection related to diverticulosis, you may not need treatment. If you have had an infection before, treatment may include:  Eating more fruits, vegetables, and grains.  Taking a fiber supplement.  Taking a live bacteria supplement (probiotic).  Taking medicine to relax your colon. HOME CARE INSTRUCTIONS   Drink at least 6-8 glasses of water each day to prevent constipation.  Try not to strain when you have a bowel movement.  Keep all follow-up  appointments. If you have had an infection before:  Increase the fiber in your diet as directed by your health care provider or dietitian.  Take a dietary fiber supplement if your health care provider approves.  Only take medicines as directed by your health care provider. SEEK MEDICAL CARE IF:   You have abdominal pain.  You have bloating.  You have cramps.  You have not gone to the bathroom in 3 days. SEEK IMMEDIATE MEDICAL CARE IF:   Your pain gets worse.  Yourbloating becomes very bad.  You have a fever or chills, and your symptoms suddenly get worse.  You begin vomiting.  You have bowel movements that are bloody or black. MAKE SURE  YOU:  Understand these instructions.  Will watch your condition.  Will get help right away if you are not doing well or get worse. Document Released: 01/30/2004 Document Revised: 05/09/2013 Document Reviewed: 03/29/2013 [facility] Patient Information 2015 [place], [place]. This information is not intended to replace advice given to you by your health care provider. Make sure you discuss any questions you have with your health care provider.  Hemorrhoids Hemorrhoids are swollen veins around the rectum or anus. There are two types of hemorrhoids:   Internal hemorrhoids. These occur in the veins just inside the rectum. They may poke through to the outside and become irritated and painful.  External hemorrhoids. These occur in the veins outside the anus and can be felt as a painful swelling or hard lump near the anus. CAUSES  Pregnancy.   Obesity.   Constipation or diarrhea.   Straining to have a bowel movement.   Sitting for long periods on the toilet.  Heavy lifting or other activity that caused you to strain.  Anal intercourse. SYMPTOMS   Pain.   Anal itching or irritation.   Rectal bleeding.   Fecal leakage.   Anal swelling.   One or more lumps around the anus.  DIAGNOSIS  Your caregiver may be able to  diagnose hemorrhoids by visual examination. Other examinations or tests that may be performed include:   Examination of the rectal area with a gloved hand (digital rectal exam).   Examination of anal canal using a small tube (scope).   A blood test if you have lost a significant amount of blood.  A test to look inside the colon (sigmoidoscopy or colonoscopy). TREATMENT Most hemorrhoids can be treated at home. However, if symptoms do not seem to be getting better or if you have a lot of rectal bleeding, your caregiver may perform a procedure to help make the hemorrhoids get smaller or remove them completely. Possible treatments include:   Placing a rubber band at the base of the hemorrhoid to cut off the circulation (rubber band ligation).   Injecting a chemical to shrink the hemorrhoid (sclerotherapy).   Using a tool to burn the hemorrhoid (infrared light therapy).   Surgically removing the hemorrhoid (hemorrhoidectomy).   Stapling the hemorrhoid to block blood flow to the tissue (hemorrhoid stapling).  HOME CARE INSTRUCTIONS   Eat foods with fiber, such as whole grains, beans, nuts, fruits, and vegetables. Ask your doctor about taking products with added fiber in them (fibersupplements).  Increase fluid intake. Drink enough water and fluids to keep your urine clear or pale yellow.   Exercise regularly.   Go to the bathroom when you have the urge to have a bowel movement. Do not wait.   Avoid straining to have bowel movements.   Keep the anal area dry and clean. Use wet toilet paper or moist towelettes after a bowel movement.   Medicated creams and suppositories may be used or applied as directed.   Only take over-the-counter or prescription medicines as directed by your caregiver.   Take warm sitz baths for 15-20 minutes, 3-4 times a day to ease pain and discomfort.   Place ice packs on the hemorrhoids if they are tender and swollen. Using ice packs between  sitz baths may be helpful.   Put ice in a plastic bag.   Place a towel between your skin and the bag.   Leave the ice on for 15-20 minutes, 3-4 times a day.   Do not use a donut-shaped pillow  or sit on the toilet for long periods. This increases blood pooling and pain.  SEEK MEDICAL CARE IF:  You have increasing pain and swelling that is not controlled by treatment or medicine.  You have uncontrolled bleeding.  You have difficulty or you are unable to have a bowel movement.  You have pain or inflammation outside the area of the hemorrhoids. MAKE SURE YOU:  Understand these instructions.  Will watch your condition.  Will get help right away if you are not doing well or get worse. Document Released: 05/01/2000 Document Revised: 04/20/2012 Document Reviewed: 03/08/2012 [facility] Patient Information 2015 [place], [place]. This information is not intended to replace advice given to you by your health care provider. Make sure you discuss any questions you have with your health care provider.

## 2014-03-21 NOTE — Interval H&P Note (Signed)
History and Physical Interval Note:  03/21/2014 7:35 AM  [name]  has presented today for surgery, with the diagnosis of constipation/GERD/esophagitis dysphagia  The various methods of treatment have been discussed with the patient and family. After consideration of risks, benefits and other options for treatment, the patient has consented to  Procedure(s) with comments: COLONOSCOPY (N/A) - 0730  ESOPHAGOGASTRODUODENOSCOPY (EGD) (N/A) SAVORY DILATION (N/A) MALONEY DILATION (N/A) as a surgical intervention .  The patient's history has been reviewed, patient examined, no change in status, stable for surgery.  I have reviewed the patient's chart and labs.  Questions were answered to the patient's satisfaction.     Robert Rourk No change. EGD with ED and colonoscopy per plan.  The risks, benefits, limitations, imponderables and alternatives regarding both EGD and colonoscopy have been reviewed with the patient. Questions have been answered. All parties agreeable.

## 2014-03-21 NOTE — H&P (View-Only) (Signed)
Primary Care Physician:  [name], MD  Primary Gastroenterologist:  Garfield Cornea, MD   Chief Complaint  Patient presents with  . Colonoscopy    HPI:  [name] is a 79 y.o. female here to schedule 10 year followup colonoscopy. She complains of significant constipation since she was asked to come in to be seen. Chronic constipation, bristol 1. Has to strain a lot. Worse after stroke. Vicodin prn only. BM 1-2 per week. Some brbpr on toilet tissue. No melena. Some abdominal discomfort with the constipation. No laxatives. Some regurgitation/with burning. Previously was on medication but did not have it refilled. Cannot recall the name. Some dysphagia to pills/solid foods, happens quite often. No unintentional weight loss, anorexia, vomiting.   Current Outpatient Prescriptions  Medication Sig Dispense Refill  . gabapentin (NEURONTIN) 300 MG capsule Take 300 mg by mouth 2 (two) times daily.       Marland Kitchen HYDROcodone-acetaminophen (NORCO/VICODIN) 5-325 MG per tablet Take 1 tablet by mouth every 6 (six) hours as needed.       . insulin detemir (LEVEMIR) 100 UNIT/ML injection Inject 80 Units into the skin at bedtime.       . insulin lispro (HUMALOG) 100 UNIT/ML injection Inject 20 Units into the skin 3 (three) times daily before meals.      Marland Kitchen loratadine (CLARITIN) 10 MG tablet Take 10 mg by mouth 2 (two) times daily.      . valsartan (DIOVAN) 320 MG tablet Take 1 tablet by mouth daily.      . furosemide (LASIX) 40 MG tablet Take 1 tablet by mouth daily.       No current facility-administered medications for this visit.    Allergies as of 02/28/2014  . (No Known Allergies)    Past Medical History  Diagnosis Date  . Diabetes mellitus without complication   . Hypertension   . GERD (gastroesophageal reflux disease)   . Arthritis   . CVA (cerebral infarction)     Past Surgical History  Procedure Laterality Date  . Cholecystectomy    . Back surgery    . Joint replacement      Rt hip,  ????? sounds like just for fracture  . Cataract extraction w/phaco Left 02/28/2013    Procedure: CATARACT EXTRACTION PHACO AND INTRAOCULAR LENS PLACEMENT (IOL) CDE=15.60;  Surgeon: Elta Guadeloupe T. Gershon Crane, MD;  Location: [facility];  Service: Ophthalmology;  Laterality: Left;  . Colonoscopy  12/12/2003    RMR: Normal rectum.  Long capacious tortuous colon, colonic mucosa appeared normal  . Esophagogastroduodenoscopy  12/28/2001    [id number] sliding hiatal hernia/. Three small bulbar ulcers, two with stigmata of bleeding and these were coagulated using heater probe unit   . Hip fracture surgery  2008    Family History  Problem Relation Age of Onset  . Crohn's disease Daughter   . Colon cancer Neg Hx     History   Social History  . Marital Status: Widowed    Spouse Name: N/A    Number of Children: 8  . Years of Education: N/A   Occupational History  . Not on file.   Social History Main Topics  . Smoking status: Never Smoker   . Smokeless tobacco: Not on file  . Alcohol Use: No  . Drug Use: No  . Sexual Activity: Not on file   Other Topics Concern  . Not on file   Social History Narrative  . No narrative on file      ROS:  General: Negative  for anorexia, weight loss, fever, chills, fatigue, weakness. Eyes: Negative for vision changes.  ENT: Negative for hoarseness, difficulty swallowing , nasal congestion. CV: Negative for chest pain, angina, palpitations, dyspnea on exertion, peripheral edema.  Respiratory: Negative for dyspnea at rest, dyspnea on exertion, cough, sputum, wheezing.  GI: See history of present illness. GU:  Negative for dysuria, hematuria, urinary incontinence, urinary frequency, nocturnal urination.  MS: Negative for joint pain, low back pain.  Derm: Negative for rash or itching.  Neuro: Negative for weakness, abnormal sensation, seizure, frequent headaches, memory loss, confusion.  Psych: Negative for anxiety, depression, suicidal ideation, hallucinations.   Endo: Negative for unusual weight change.  Heme: Negative for bruising or bleeding. Allergy: Negative for rash or hives.    Physical Examination:  BP 159/76  Pulse 90  Temp(Src) 97.5 F (36.4 C) (Oral)  Ht 5\' 6"  (1.676 m)  Wt 210 lb 6.4 oz (95.437 kg)  BMI 33.98 kg/m2   General: Well-nourished, well-developed in no acute distress.  Head: Normocephalic, atraumatic.   Eyes: Conjunctiva pink, no icterus. Mouth: Oropharyngeal mucosa moist and pink , no lesions erythema or exudate. Neck: Supple without thyromegaly, masses, or lymphadenopathy.  Lungs: Clear to auscultation bilaterally.  Heart: Regular rate and rhythm, no murmurs rubs or gallops.  Abdomen: Bowel sounds are normal, nontender, nondistended, no hepatosplenomegaly or masses, no abdominal bruits or    hernia , no rebound or guarding.   Rectal: Not performed Extremities: 2+ pitting edema at the ankles bilaterally. No clubbing or deformities.  Neuro: Alert and oriented x 4 , grossly normal neurologically.  Skin: Warm and dry, no rash or jaundice.   Psych: Alert and cooperative, normal mood and affect.

## 2014-03-23 ENCOUNTER — Encounter (HOSPITAL_COMMUNITY): Payer: Self-pay | Admitting: Internal Medicine

## 2014-03-26 ENCOUNTER — Encounter: Payer: Self-pay | Admitting: Internal Medicine

## 2014-07-13 DIAGNOSIS — I10 Essential (primary) hypertension: Secondary | ICD-10-CM | POA: Diagnosis not present

## 2014-07-13 DIAGNOSIS — I6350 Cerebral infarction due to unspecified occlusion or stenosis of unspecified cerebral artery: Secondary | ICD-10-CM | POA: Diagnosis not present

## 2014-07-13 DIAGNOSIS — E1142 Type 2 diabetes mellitus with diabetic polyneuropathy: Secondary | ICD-10-CM | POA: Diagnosis not present

## 2014-08-24 ENCOUNTER — Emergency Department (HOSPITAL_COMMUNITY)
Admission: EM | Admit: 2014-08-24 | Discharge: 2014-08-24 | Disposition: A | Discharge status: Home / Self Care | Payer: Commercial Managed Care - HMO | Attending: Emergency Medicine | Admitting: Emergency Medicine

## 2014-08-24 ENCOUNTER — Encounter (HOSPITAL_COMMUNITY): Payer: Self-pay | Admitting: *Deleted

## 2014-08-24 ENCOUNTER — Emergency Department (HOSPITAL_COMMUNITY): Payer: Commercial Managed Care - HMO

## 2014-08-24 DIAGNOSIS — I10 Essential (primary) hypertension: Secondary | ICD-10-CM | POA: Diagnosis not present

## 2014-08-24 DIAGNOSIS — R109 Unspecified abdominal pain: Secondary | ICD-10-CM

## 2014-08-24 DIAGNOSIS — R110 Nausea: Secondary | ICD-10-CM | POA: Insufficient documentation

## 2014-08-24 DIAGNOSIS — E119 Type 2 diabetes mellitus without complications: Secondary | ICD-10-CM | POA: Diagnosis not present

## 2014-08-24 DIAGNOSIS — M1990 Unspecified osteoarthritis, unspecified site: Secondary | ICD-10-CM | POA: Insufficient documentation

## 2014-08-24 DIAGNOSIS — Z794 Long term (current) use of insulin: Secondary | ICD-10-CM | POA: Insufficient documentation

## 2014-08-24 DIAGNOSIS — Z79899 Other long term (current) drug therapy: Secondary | ICD-10-CM | POA: Insufficient documentation

## 2014-08-24 DIAGNOSIS — N390 Urinary tract infection, site not specified: Secondary | ICD-10-CM | POA: Diagnosis not present

## 2014-08-24 DIAGNOSIS — Z8673 Personal history of transient ischemic attack (TIA), and cerebral infarction without residual deficits: Secondary | ICD-10-CM | POA: Insufficient documentation

## 2014-08-24 DIAGNOSIS — K219 Gastro-esophageal reflux disease without esophagitis: Secondary | ICD-10-CM | POA: Insufficient documentation

## 2014-08-24 DIAGNOSIS — R1013 Epigastric pain: Secondary | ICD-10-CM | POA: Diagnosis not present

## 2014-08-24 HISTORY — DX: Cerebral infarction, unspecified: I63.9

## 2014-08-24 LAB — URINALYSIS, ROUTINE W REFLEX MICROSCOPIC
Bilirubin Urine: NEGATIVE
Glucose, UA: NEGATIVE mg/dL
KETONES UR: NEGATIVE mg/dL
Nitrite: POSITIVE — AB
PH: 6.5 (ref 5.0–8.0)
Protein, ur: 100 mg/dL — AB
Specific Gravity, Urine: 1.02 (ref 1.005–1.030)
Urobilinogen, UA: 0.2 mg/dL (ref 0.0–1.0)

## 2014-08-24 LAB — COMPREHENSIVE METABOLIC PANEL
ALT: 16 U/L (ref 0–35)
AST: 21 U/L (ref 0–37)
Albumin: 3.6 g/dL (ref 3.5–5.2)
Alkaline Phosphatase: 78 U/L (ref 39–117)
Anion gap: 10 (ref 5–15)
BILIRUBIN TOTAL: 0.6 mg/dL (ref 0.3–1.2)
BUN: 15 mg/dL (ref 6–23)
CALCIUM: 8.8 mg/dL (ref 8.4–10.5)
CO2: 24 mmol/L (ref 19–32)
CREATININE: 1.12 mg/dL — AB (ref 0.50–1.10)
Chloride: 104 mmol/L (ref 96–112)
GFR calc Af Amer: 53 mL/min — ABNORMAL LOW (ref >90)
GFR, EST NON AFRICAN AMERICAN: 46 mL/min — AB (ref >90)
GLUCOSE: 189 mg/dL — AB (ref 70–99)
Potassium: 3.4 mmol/L — ABNORMAL LOW (ref 3.5–5.1)
Sodium: 138 mmol/L (ref 135–145)
Total Protein: 8.1 g/dL (ref 6.0–8.3)

## 2014-08-24 LAB — CBC WITH DIFFERENTIAL/PLATELET
BASOS PCT: 0 % (ref 0–1)
Basophils Absolute: 0 10*3/uL (ref 0.0–0.1)
EOS PCT: 1 % (ref 0–5)
Eosinophils Absolute: 0.1 10*3/uL (ref 0.0–0.7)
HEMATOCRIT: 39.5 % (ref 36.0–46.0)
Hemoglobin: 13.1 g/dL (ref 12.0–15.0)
Lymphocytes Relative: 28 % (ref 12–46)
Lymphs Abs: 2.8 10*3/uL (ref 0.7–4.0)
MCH: 29.7 pg (ref 26.0–34.0)
MCHC: 33.2 g/dL (ref 30.0–36.0)
MCV: 89.6 fL (ref 78.0–100.0)
MONO ABS: 0.6 10*3/uL (ref 0.1–1.0)
Monocytes Relative: 6 % (ref 3–12)
NEUTROS ABS: 6.5 10*3/uL (ref 1.7–7.7)
Neutrophils Relative %: 65 % (ref 43–77)
Platelets: 266 10*3/uL (ref 150–400)
RBC: 4.41 MIL/uL (ref 3.87–5.11)
RDW: 13.9 % (ref 11.5–15.5)
WBC: 10.1 10*3/uL (ref 4.0–10.5)

## 2014-08-24 LAB — URINE MICROSCOPIC-ADD ON

## 2014-08-24 LAB — CBG MONITORING, ED: GLUCOSE-CAPILLARY: 175 mg/dL — AB (ref 70–99)

## 2014-08-24 LAB — TROPONIN I: Troponin I: <0.03 ng/mL (ref <0.031)

## 2014-08-24 LAB — I-STAT CG4 LACTIC ACID, ED: LACTIC ACID, VENOUS: 1.15 mmol/L (ref 0.5–2.0)

## 2014-08-24 LAB — LIPASE, BLOOD: LIPASE: 53 U/L (ref 11–59)

## 2014-08-24 MED ORDER — GI COCKTAIL ~~LOC~~
30.0000 mL | Freq: Once | ORAL | Status: AC
  Administered 2014-08-24: 30 mL via ORAL
  Filled 2014-08-24: qty 30

## 2014-08-24 MED ORDER — SODIUM CHLORIDE 0.9 % IV SOLN
INTRAVENOUS | Status: DC
  Administered 2014-08-24: 16:00:00 via INTRAVENOUS

## 2014-08-24 MED ORDER — FAMOTIDINE 20 MG PO TABS
40.0000 mg | ORAL_TABLET | Freq: Once | ORAL | Status: AC
  Administered 2014-08-24: 40 mg via ORAL
  Filled 2014-08-24: qty 2

## 2014-08-24 MED ORDER — CEPHALEXIN 500 MG PO CAPS: 500.0000 mg | ORAL_CAPSULE | Freq: Four times a day (QID) | ORAL | Status: DC

## 2014-08-24 NOTE — ED Notes (Signed)
MD at bedside. 

## 2014-08-24 NOTE — ED Notes (Signed)
abd pain,"aching", since yesterday,nausea, no vomiting,  No fever. Has a Sore place" rt lower leg, with band aid covering it.   Both legs hurt.

## 2014-08-24 NOTE — ED Notes (Signed)
Patient transported to X-ray 

## 2014-08-24 NOTE — Discharge Instructions (Signed)
Abdominal Pain °Many things can cause abdominal pain. Usually, abdominal pain is not caused by a disease and will improve without treatment. It can often be observed and treated at home. Your health care provider will do a physical exam and possibly order blood tests and X-rays to help determine the seriousness of your pain. However, in many cases, more time must pass before a clear cause of the pain can be found. Before that point, your health care provider may not know if you need more testing or further treatment. °HOME CARE INSTRUCTIONS  °Monitor your abdominal pain for any changes. The following actions may help to alleviate any discomfort you are experiencing: °· Only take over-the-counter or prescription medicines as directed by your health care provider. °· Do not take laxatives unless directed to do so by your health care provider. °· Try a clear liquid diet (broth, tea, or water) as directed by your health care provider. Slowly move to a bland diet as tolerated. °SEEK MEDICAL CARE IF: °· You have unexplained abdominal pain. °· You have abdominal pain associated with nausea or diarrhea. °· You have pain when you urinate or have a bowel movement. °· You experience abdominal pain that wakes you in the night. °· You have abdominal pain that is worsened or improved by eating food. °· You have abdominal pain that is worsened with eating fatty foods. °· You have a fever. °SEEK IMMEDIATE MEDICAL CARE IF:  °· Your pain does not go away within 2 hours. °· You keep throwing up (vomiting). °· Your pain is felt only in portions of the abdomen, such as the right side or the left lower portion of the abdomen. °· You pass bloody or black tarry stools. °MAKE SURE YOU: °· Understand these instructions.   °· Will watch your condition.   °· Will get help right away if you are not doing well or get worse.   °Document Released: 02/11/2005 Document Revised: 05/09/2013 Document Reviewed: 01/11/2013 °ExitCare® Patient Information  ©2015 [employer]. This information is not intended to replace advice given to you by your health care provider. Make sure you discuss any questions you have with your health care provider. °Urinary Tract Infection °Urinary tract infections (UTIs) can develop anywhere along your urinary tract. Your urinary tract is your body's drainage system for removing wastes and extra water. Your urinary tract includes two kidneys, two ureters, a bladder, and a urethra. Your kidneys are a pair of bean-shaped organs. Each kidney is about the size of your fist. They are located below your ribs, one on each side of your spine. °CAUSES °Infections are caused by microbes, which are microscopic organisms, including fungi, viruses, and bacteria. These organisms are so small that they can only be seen through a microscope. Bacteria are the microbes that most commonly cause UTIs. °SYMPTOMS  °Symptoms of UTIs may vary by age and gender of the patient and by the location of the infection. Symptoms in young women typically include a frequent and intense urge to urinate and a painful, burning feeling in the bladder or urethra during urination. Older women and men are more likely to be tired, shaky, and weak and have muscle aches and abdominal pain. A fever may mean the infection is in your kidneys. Other symptoms of a kidney infection include pain in your back or sides below the ribs, nausea, and vomiting. °DIAGNOSIS °To diagnose a UTI, your caregiver will ask you about your symptoms. Your caregiver also will ask to provide a urine sample. The urine sample   will be tested for bacteria and white blood cells. White blood cells are made by your body to help fight infection. °TREATMENT  °Typically, UTIs can be treated with medication. Because most UTIs are caused by a bacterial infection, they usually can be treated with the use of antibiotics. The choice of antibiotic and length of treatment depend on your symptoms and the type of bacteria  causing your infection. °HOME CARE INSTRUCTIONS °· If you were prescribed antibiotics, take them exactly as your caregiver instructs you. Finish the medication even if you feel better after you have only taken some of the medication. °· Drink enough water and fluids to keep your urine clear or pale yellow. °· Avoid caffeine, tea, and carbonated beverages. They tend to irritate your bladder. °· Empty your bladder often. Avoid holding urine for long periods of time. °· Empty your bladder before and after sexual intercourse. °· After a bowel movement, women should cleanse from front to back. Use each tissue only once. °SEEK MEDICAL CARE IF:  °· You have back pain. °· You develop a fever. °· Your symptoms do not begin to resolve within 3 days. °SEEK IMMEDIATE MEDICAL CARE IF:  °· You have severe back pain or lower abdominal pain. °· You develop chills. °· You have nausea or vomiting. °· You have continued burning or discomfort with urination. °MAKE SURE YOU:  °· Understand these instructions. °· Will watch your condition. °· Will get help right away if you are not doing well or get worse. °Document Released: 02/11/2005 Document Revised: 11/03/2011 Document Reviewed: 06/12/2011 °ExitCare® Patient Information ©2015 [employer]. This information is not intended to replace advice given to you by your health care provider. Make sure you discuss any questions you have with your health care provider. ° °

## 2014-08-24 NOTE — ED Provider Notes (Signed)
CSN: [id number]     Arrival date & time 08/24/14  1458 History   First MD Initiated Contact with Patient 08/24/14 1519     Chief Complaint  Patient presents with  . Abdominal Pain     (Consider location/radiation/quality/duration/timing/severity/associated sxs/prior Treatment) HPI Comments: Denies anginal type sx, no urinary sx--h/o similar sx in past without a dx  Patient is a 80 y.o. female presenting with abdominal pain. The history is provided by the patient.  Abdominal Pain Pain location:  Epigastric Pain quality: aching   Pain radiates to:  Does not radiate Pain severity:  Mild Onset quality:  Sudden Duration:  2 hours Timing:  Constant Progression:  Unchanged Chronicity:  New Context: not eating   Relieved by:  Nothing Worsened by:  Nothing tried Ineffective treatments:  None tried Associated symptoms: nausea   Associated symptoms: no chest pain, no cough, no diarrhea, no fever and no vomiting     Past Medical History  Diagnosis Date  . Diabetes mellitus without complication   . Hypertension   . GERD (gastroesophageal reflux disease)   . Arthritis   . CVA (cerebral infarction)   . Stroke    Past Surgical History  Procedure Laterality Date  . Cholecystectomy    . Back surgery    . Joint replacement      Rt hip, ????? sounds like just for fracture  . Cataract extraction w/phaco Left 02/28/2013    Procedure: CATARACT EXTRACTION PHACO AND INTRAOCULAR LENS PLACEMENT (IOL) CDE=15.60;  Surgeon: Elta Guadeloupe T. Gershon Crane, MD;  Location: [facility];  Service: Ophthalmology;  Laterality: Left;  . Colonoscopy  12/12/2003    RMR: Normal rectum.  Long capacious tortuous colon, colonic mucosa appeared normal  . Esophagogastroduodenoscopy  12/28/2001    [id number] sliding hiatal hernia/. Three small bulbar ulcers, two with stigmata of bleeding and these were coagulated using heater probe unit   . Hip fracture surgery  2008  . Colonoscopy N/A 03/21/2014    Procedure: COLONOSCOPY;   Surgeon: [name], MD;  Location: [facility];  Service: Endoscopy;  Laterality: N/A;  0730   . Esophagogastroduodenoscopy N/A 03/21/2014    Procedure: ESOPHAGOGASTRODUODENOSCOPY (EGD);  Surgeon: [name], MD;  Location: [facility];  Service: Endoscopy;  Laterality: N/A;  . Savory dilation N/A 03/21/2014    Procedure: SAVORY DILATION;  Surgeon: [name], MD;  Location: [facility];  Service: Endoscopy;  Laterality: N/A;  [name] dilation N/A 03/21/2014    Procedure: [name] DILATION;  Surgeon: [name], MD;  Location: [facility];  Service: Endoscopy;  Laterality: N/A;  . Eye surgery     Family History  Problem Relation Age of Onset  . Crohn's disease Daughter   . Colon cancer Neg Hx    History  Substance Use Topics  . Smoking status: Never Smoker   . Smokeless tobacco: Not on file  . Alcohol Use: No   OB History    No data available     Review of Systems  Constitutional: Negative for fever.  Respiratory: Negative for cough.   Cardiovascular: Negative for chest pain.  Gastrointestinal: Positive for nausea and abdominal pain. Negative for vomiting and diarrhea.  All other systems reviewed and are negative.     Allergies  Review of patient's allergies indicates no known allergies.  Home Medications   Prior to Admission medications   Medication Sig Start Date End Date Taking? Authorizing Provider  gabapentin (NEURONTIN) 300 MG capsule Take 300 mg  by mouth 2 (two) times daily.  02/22/14   Historical Provider, MD  HYDROcodone-acetaminophen (NORCO/VICODIN) 5-325 MG per tablet Take 1 tablet by mouth every 6 (six) hours as needed for moderate pain.  01/26/14   Historical Provider, MD  insulin detemir (LEVEMIR) 100 UNIT/ML injection Inject 80 Units into the skin at bedtime.     Historical Provider, MD  insulin lispro (HUMALOG) 100 UNIT/ML injection Inject 20 Units into the skin 3 (three) times daily before meals.    Historical Provider, MD   lansoprazole (PREVACID) 30 MG capsule Take 1 capsule (30 mg total) by mouth daily before breakfast. 02/28/14   Mahala Menghini, PA-C  lubiprostone (AMITIZA) 8 MCG capsule Take 1 capsule (8 mcg total) by mouth 2 (two) times daily with a meal. 02/28/14   Mahala Menghini, PA-C  polyethylene glycol-electrolytes (TRILYTE) 420 G solution Take 4,000 mLs by mouth as directed. 02/28/14   [name], MD  valsartan (DIOVAN) 320 MG tablet Take 1 tablet by mouth daily. 08/18/13   Historical Provider, MD   BP 181/90 mmHg  Pulse 105  Temp(Src) 98.7 F (37.1 C) (Oral)  Resp 18  Ht 5\' 6"  (1.676 m)  Wt 200 lb (90.719 kg)  BMI 32.30 kg/m2  SpO2 98% Physical Exam  Constitutional: She is oriented to person, place, and time. She appears well-developed and well-nourished.  Non-toxic appearance. No distress.  HENT:  Head: Normocephalic and atraumatic.  Eyes: Conjunctivae, EOM and lids are normal. Pupils are equal, round, and reactive to light.  Neck: Normal range of motion. Neck supple. No tracheal deviation present. No thyroid mass present.  Cardiovascular: Normal rate, regular rhythm and normal heart sounds.  Exam reveals no gallop.   No murmur heard. Pulmonary/Chest: Effort normal and breath sounds normal. No stridor. No respiratory distress. She has no decreased breath sounds. She has no wheezes. She has no rhonchi. She has no rales.  Abdominal: Soft. Normal appearance and bowel sounds are normal. She exhibits no distension. There is tenderness in the epigastric area. There is no rigidity, no rebound, no guarding and no CVA tenderness.  Musculoskeletal: Normal range of motion. She exhibits no edema or tenderness.  Neurological: She is alert and oriented to person, place, and time. She has normal strength. No cranial nerve deficit or sensory deficit. GCS eye subscore is 4. GCS verbal subscore is 5. GCS motor subscore is 6.  Skin: Skin is warm and dry. No abrasion and no rash noted.  Psychiatric: She has a  normal mood and affect. Her speech is normal and behavior is normal.  Nursing note and vitals reviewed.   ED Course  Procedures (including critical care time) Labs Review Labs Reviewed  CBG MONITORING, ED - Abnormal; Notable for the following:    Glucose-Capillary 175 (*)    All other components within normal limits  URINE CULTURE  CBC WITH DIFFERENTIAL/PLATELET  COMPREHENSIVE METABOLIC PANEL  TROPONIN I  LIPASE, BLOOD  URINALYSIS, ROUTINE W REFLEX MICROSCOPIC  I-STAT CG4 LACTIC ACID, ED    Imaging Review No results found.   EKG Interpretation None      MDM   Final diagnoses:  Abdominal pain    Patient given GI cocktail and Pepcid feels better. Does have evidence of a urinary tract infection on her urinalysis. We'll treat for this.  Lacretia Leigh, MD 08/24/14 [id number]

## 2014-08-28 LAB — URINE CULTURE: Colony Count: >=100000

## 2014-08-29 ENCOUNTER — Telehealth (HOSPITAL_BASED_OUTPATIENT_CLINIC_OR_DEPARTMENT_OTHER): Payer: Self-pay | Admitting: Emergency Medicine

## 2014-08-29 NOTE — Telephone Encounter (Signed)
Post ED Visit - Positive Culture Follow-up  Culture report reviewed by antimicrobial stewardship pharmacist: []  Wes Taylor, Pharm.D., BCPS [x]  Heide Guile, Pharm.D., BCPS []  Alycia Rossetti, Pharm.D., BCPS []  [place], Pharm.D., BCPS, AAHIVP []  Legrand Como, Pharm.D., BCPS, AAHIVP []  Isac Sarna, Pharm.D., BCPS  Positive urine culture E. Coli Treated with cephalexin, organism sensitive to the same and no further patient follow-up is required at this time.  Hazle Nordmann 08/29/2014, 1:54 PM

## 2014-09-26 DIAGNOSIS — W19XXXD Unspecified fall, subsequent encounter: Secondary | ICD-10-CM | POA: Diagnosis not present

## 2014-09-26 DIAGNOSIS — E1142 Type 2 diabetes mellitus with diabetic polyneuropathy: Secondary | ICD-10-CM | POA: Diagnosis not present

## 2014-09-26 DIAGNOSIS — G8190 Hemiplegia, unspecified affecting unspecified side: Secondary | ICD-10-CM | POA: Diagnosis not present

## 2014-12-21 DIAGNOSIS — I10 Essential (primary) hypertension: Secondary | ICD-10-CM | POA: Diagnosis not present

## 2014-12-21 DIAGNOSIS — G8190 Hemiplegia, unspecified affecting unspecified side: Secondary | ICD-10-CM | POA: Diagnosis not present

## 2014-12-21 DIAGNOSIS — M549 Dorsalgia, unspecified: Secondary | ICD-10-CM | POA: Diagnosis not present

## 2014-12-21 DIAGNOSIS — I6350 Cerebral infarction due to unspecified occlusion or stenosis of unspecified cerebral artery: Secondary | ICD-10-CM | POA: Diagnosis not present

## 2014-12-21 DIAGNOSIS — H1045 Other chronic allergic conjunctivitis: Secondary | ICD-10-CM | POA: Diagnosis not present

## 2014-12-21 DIAGNOSIS — E1142 Type 2 diabetes mellitus with diabetic polyneuropathy: Secondary | ICD-10-CM | POA: Diagnosis not present

## 2015-02-20 ENCOUNTER — Emergency Department (HOSPITAL_COMMUNITY)
Admission: EM | Admit: 2015-02-20 | Discharge: 2015-02-20 | Disposition: A | Discharge status: Home / Self Care | Payer: Commercial Managed Care - HMO | Attending: Emergency Medicine | Admitting: Emergency Medicine

## 2015-02-20 ENCOUNTER — Emergency Department (HOSPITAL_COMMUNITY): Payer: Commercial Managed Care - HMO

## 2015-02-20 ENCOUNTER — Encounter (HOSPITAL_COMMUNITY): Payer: Self-pay | Admitting: Emergency Medicine

## 2015-02-20 DIAGNOSIS — E119 Type 2 diabetes mellitus without complications: Secondary | ICD-10-CM | POA: Insufficient documentation

## 2015-02-20 DIAGNOSIS — I10 Essential (primary) hypertension: Secondary | ICD-10-CM | POA: Insufficient documentation

## 2015-02-20 DIAGNOSIS — K219 Gastro-esophageal reflux disease without esophagitis: Secondary | ICD-10-CM | POA: Diagnosis not present

## 2015-02-20 DIAGNOSIS — Z794 Long term (current) use of insulin: Secondary | ICD-10-CM | POA: Diagnosis not present

## 2015-02-20 DIAGNOSIS — R110 Nausea: Secondary | ICD-10-CM | POA: Diagnosis not present

## 2015-02-20 DIAGNOSIS — N390 Urinary tract infection, site not specified: Secondary | ICD-10-CM | POA: Insufficient documentation

## 2015-02-20 DIAGNOSIS — Z8739 Personal history of other diseases of the musculoskeletal system and connective tissue: Secondary | ICD-10-CM | POA: Diagnosis not present

## 2015-02-20 DIAGNOSIS — Z79899 Other long term (current) drug therapy: Secondary | ICD-10-CM | POA: Diagnosis not present

## 2015-02-20 DIAGNOSIS — R109 Unspecified abdominal pain: Secondary | ICD-10-CM | POA: Diagnosis not present

## 2015-02-20 DIAGNOSIS — Z8673 Personal history of transient ischemic attack (TIA), and cerebral infarction without residual deficits: Secondary | ICD-10-CM | POA: Diagnosis not present

## 2015-02-20 DIAGNOSIS — R1013 Epigastric pain: Secondary | ICD-10-CM | POA: Diagnosis present

## 2015-02-20 DIAGNOSIS — Z9049 Acquired absence of other specified parts of digestive tract: Secondary | ICD-10-CM | POA: Insufficient documentation

## 2015-02-20 LAB — URINALYSIS, ROUTINE W REFLEX MICROSCOPIC
Bilirubin Urine: NEGATIVE
Glucose, UA: NEGATIVE mg/dL
Ketones, ur: NEGATIVE mg/dL
NITRITE: NEGATIVE
Protein, ur: 30 mg/dL — AB
SPECIFIC GRAVITY, URINE: 1.02 (ref 1.005–1.030)
UROBILINOGEN UA: 0.2 mg/dL (ref 0.0–1.0)
pH: 6.5 (ref 5.0–8.0)

## 2015-02-20 LAB — URINE MICROSCOPIC-ADD ON

## 2015-02-20 LAB — CBC WITH DIFFERENTIAL/PLATELET
BASOS PCT: 0 %
Basophils Absolute: 0 10*3/uL (ref 0.0–0.1)
EOS PCT: 2 %
Eosinophils Absolute: 0.2 10*3/uL (ref 0.0–0.7)
HCT: 38.6 % (ref 36.0–46.0)
HEMOGLOBIN: 12.8 g/dL (ref 12.0–15.0)
Lymphocytes Relative: 33 %
Lymphs Abs: 3 10*3/uL (ref 0.7–4.0)
MCH: 29.8 pg (ref 26.0–34.0)
MCHC: 33.2 g/dL (ref 30.0–36.0)
MCV: 89.8 fL (ref 78.0–100.0)
Monocytes Absolute: 0.6 10*3/uL (ref 0.1–1.0)
Monocytes Relative: 6 %
NEUTROS PCT: 58 %
Neutro Abs: 5.2 10*3/uL (ref 1.7–7.7)
Platelets: 262 10*3/uL (ref 150–400)
RBC: 4.3 MIL/uL (ref 3.87–5.11)
RDW: 14.8 % (ref 11.5–15.5)
WBC: 9.1 10*3/uL (ref 4.0–10.5)

## 2015-02-20 LAB — COMPREHENSIVE METABOLIC PANEL
ALBUMIN: 3.5 g/dL (ref 3.5–5.0)
ALK PHOS: 64 U/L (ref 38–126)
ALT: 12 U/L — AB (ref 14–54)
ANION GAP: 8 (ref 5–15)
AST: 17 U/L (ref 15–41)
BUN: 20 mg/dL (ref 6–20)
CALCIUM: 8.3 mg/dL — AB (ref 8.9–10.3)
CO2: 25 mmol/L (ref 22–32)
Chloride: 108 mmol/L (ref 101–111)
Creatinine, Ser: 1.18 mg/dL — ABNORMAL HIGH (ref 0.44–1.00)
GFR calc Af Amer: 50 mL/min — ABNORMAL LOW (ref >60)
GFR calc non Af Amer: 43 mL/min — ABNORMAL LOW (ref >60)
GLUCOSE: 165 mg/dL — AB (ref 65–99)
Potassium: 3.6 mmol/L (ref 3.5–5.1)
Sodium: 141 mmol/L (ref 135–145)
Total Bilirubin: 0.6 mg/dL (ref 0.3–1.2)
Total Protein: 7.8 g/dL (ref 6.5–8.1)

## 2015-02-20 MED ORDER — DEXTROSE 5 % IV SOLN
1.0000 g | Freq: Once | INTRAVENOUS | Status: AC
  Administered 2015-02-20: 1 g via INTRAVENOUS
  Filled 2015-02-20: qty 10

## 2015-02-20 MED ORDER — PANTOPRAZOLE SODIUM 20 MG PO TBEC: 20.0000 mg | DELAYED_RELEASE_TABLET | Freq: Every day | ORAL | Status: DC

## 2015-02-20 MED ORDER — PANTOPRAZOLE SODIUM 40 MG IV SOLR
40.0000 mg | Freq: Once | INTRAVENOUS | Status: AC
  Administered 2015-02-20: 40 mg via INTRAVENOUS
  Filled 2015-02-20: qty 40

## 2015-02-20 MED ORDER — CEPHALEXIN 500 MG PO CAPS: 500.0000 mg | ORAL_CAPSULE | Freq: Four times a day (QID) | ORAL | Status: DC

## 2015-02-20 MED ORDER — SODIUM CHLORIDE 0.9 % IJ SOLN
INTRAMUSCULAR | Status: AC
  Filled 2015-02-20: qty 60

## 2015-02-20 MED ORDER — IOHEXOL 300 MG/ML  SOLN
100.0000 mL | Freq: Once | INTRAMUSCULAR | Status: AC | PRN
  Administered 2015-02-20: 100 mL via INTRAVENOUS

## 2015-02-20 NOTE — ED Provider Notes (Signed)
CSN: [id number]     Arrival date & time 02/20/15  [id number] History  By signing my name below, I, Terressa Koyanagi, attest that this documentation has been prepared under the direction and in the presence of Milton Ferguson, MD. Electronically Signed: Terressa Koyanagi, ED Scribe. 02/20/2015. 11:45 AM.  Chief Complaint  Patient presents with  . Abdominal Pain   Patient is a 80 y.o. female presenting with abdominal pain. The history is provided by the patient. No language interpreter was used.  Abdominal Pain Pain location:  Epigastric Pain quality: aching   Pain radiates to:  L flank Pain severity:  Moderate Onset quality:  Gradual Duration:  1 week Timing:  Intermittent Progression:  Worsening Chronicity:  New Context: not eating, not retching and not trauma   Associated symptoms: nausea   Associated symptoms: no chest pain, no cough, no diarrhea, no fatigue, no hematuria and no vomiting    PCP: FANTA,TESFAYE, MD HPI Comments: [name] is a 80 y.o. female, with PMHx noted below including GERD, HTN and stroke, who presents to the Emergency Department complaining of recurrent, atraumatic, intermittent, gradually worsening, 5/10, aching epigastric abd pain radiating to the left flank onset approximately one week ago. Associated Sx involve nausea with no vomiting. Pt denies any Hx of endoscopy.   Past Medical History  Diagnosis Date  . Diabetes mellitus without complication ([place])   . Hypertension   . GERD (gastroesophageal reflux disease)   . Arthritis   . CVA (cerebral infarction)   . Stroke [facility])    Past Surgical History  Procedure Laterality Date  . Cholecystectomy    . Back surgery    . Joint replacement      Rt hip, ????? sounds like just for fracture  . Cataract extraction w/phaco Left 02/28/2013    Procedure: CATARACT EXTRACTION PHACO AND INTRAOCULAR LENS PLACEMENT (IOL) CDE=15.60;  Surgeon: Elta Guadeloupe T. Gershon Crane, MD;  Location: [facility];  Service: Ophthalmology;  Laterality: Left;  .  Colonoscopy  12/12/2003    RMR: Normal rectum.  Long capacious tortuous colon, colonic mucosa appeared normal  . Esophagogastroduodenoscopy  12/28/2001    [id number] sliding hiatal hernia/. Three small bulbar ulcers, two with stigmata of bleeding and these were coagulated using heater probe unit   . Hip fracture surgery  2008  . Colonoscopy N/A 03/21/2014    Procedure: COLONOSCOPY;  Surgeon: [name], MD;  Location: [facility];  Service: Endoscopy;  Laterality: N/A;  0730   . Esophagogastroduodenoscopy N/A 03/21/2014    Procedure: ESOPHAGOGASTRODUODENOSCOPY (EGD);  Surgeon: [name], MD;  Location: [facility];  Service: Endoscopy;  Laterality: N/A;  . Savory dilation N/A 03/21/2014    Procedure: SAVORY DILATION;  Surgeon: [name], MD;  Location: [facility];  Service: Endoscopy;  Laterality: N/A;  [name] dilation N/A 03/21/2014    Procedure: [name] DILATION;  Surgeon: [name], MD;  Location: [facility];  Service: Endoscopy;  Laterality: N/A;  . Eye surgery     Family History  Problem Relation Age of Onset  . Crohn's disease Daughter   . Colon cancer Neg Hx    Social History  Substance Use Topics  . Smoking status: Never Smoker   . Smokeless tobacco: None  . Alcohol Use: No   OB History    No data available     Review of Systems  Constitutional: Negative for appetite change and fatigue.  HENT: Negative for congestion, ear discharge and sinus pressure.  Eyes: Negative for discharge.  Respiratory: Negative for cough.   Cardiovascular: Negative for chest pain.  Gastrointestinal: Positive for nausea and abdominal pain. Negative for vomiting and diarrhea.  Genitourinary: Negative for frequency and hematuria.  Musculoskeletal: Negative for back pain.  Skin: Negative for rash.  Neurological: Negative for seizures and headaches.  Psychiatric/Behavioral: Negative for hallucinations.   Allergies  Review of patient's allergies indicates no known  allergies.  Home Medications   Prior to Admission medications   Medication Sig Start Date End Date Taking? Authorizing Provider  furosemide (LASIX) 20 MG tablet Take 20 mg by mouth daily.   Yes Historical Provider, MD  gabapentin (NEURONTIN) 300 MG capsule Take 300 mg by mouth 2 (two) times daily.  02/22/14  Yes Historical Provider, MD  insulin detemir (LEVEMIR) 100 UNIT/ML injection Inject 80 Units into the skin at bedtime.    Yes Historical Provider, MD  insulin lispro (HUMALOG) 100 UNIT/ML injection Inject 20 Units into the skin 3 (three) times daily before meals.   Yes Historical Provider, MD  simvastatin (ZOCOR) 20 MG tablet Take 20 mg by mouth daily.   Yes Historical Provider, MD  valsartan (DIOVAN) 320 MG tablet Take 1 tablet by mouth daily. 08/18/13  Yes Historical Provider, MD  cephALEXin (KEFLEX) 500 MG capsule Take 1 capsule (500 mg total) by mouth 4 (four) times daily. Patient not taking: Reported on 02/20/2015 08/24/14   Lacretia Leigh, MD  lansoprazole (PREVACID) 30 MG capsule Take 1 capsule (30 mg total) by mouth daily before breakfast. Patient not taking: Reported on 02/20/2015 02/28/14   Mahala Menghini, PA-C  lubiprostone (AMITIZA) 8 MCG capsule Take 1 capsule (8 mcg total) by mouth 2 (two) times daily with a meal. Patient not taking: Reported on 02/20/2015 02/28/14   Mahala Menghini, PA-C   Triage Vitals: BP 182/94 mmHg  Pulse 89  Temp(Src) 98.1 F (36.7 C) (Oral)  Resp 16  Ht 5\' 5"  (1.651 m)  Wt 200 lb (90.719 kg)  BMI 33.28 kg/m2  SpO2 97% Physical Exam  Constitutional: She is oriented to person, place, and time. She appears well-developed.  HENT:  Head: Normocephalic.  Eyes: Conjunctivae and EOM are normal. No scleral icterus.  Neck: Neck supple. No thyromegaly present.  Cardiovascular: Normal rate and regular rhythm.  Exam reveals no gallop and no friction rub.   No murmur heard. Pulmonary/Chest: No stridor. She has no wheezes. She has no rales. She exhibits no  tenderness.  Abdominal: She exhibits no distension. There is tenderness in the epigastric area. There is no rebound.  Musculoskeletal: Normal range of motion. She exhibits no edema.  Lymphadenopathy:    She has no cervical adenopathy.  Neurological: She is oriented to person, place, and time. She exhibits normal muscle tone. Coordination normal.  Skin: No rash noted. No erythema.  Psychiatric: She has a normal mood and affect. Her behavior is normal.    ED Course  Procedures (including critical care time) DIAGNOSTIC STUDIES: Oxygen Saturation is 97% on RA, nl by my interpretation.    COORDINATION OF CARE: 11:42 AM: Discussed treatment plan which includes imaging and labs with pt at bedside; patient verbalizes understanding and agrees with treatment plan.  Labs Review Labs Reviewed - No data to display  Imaging Review No results found. I have personally reviewed and evaluated these images and lab results as part of my medical decision-making.   EKG Interpretation None      MDM   Final diagnoses:  None   Patient  complains of epigastric abdominal pain labs suggest urinary tract infection will treat patient with Keflex and pro time and follow-up with family doctor  The chart was scribed for me under my direct supervision.  I personally performed the history, physical, and medical decision making and all procedures in the evaluation of this patient.Milton Ferguson, MD 02/20/15 1435

## 2015-02-20 NOTE — Discharge Instructions (Signed)
Follow up with your md next week. °

## 2015-02-20 NOTE — ED Notes (Signed)
Pt states that she has been having epigastric pain going into the left side of her abdomen since last week.  Pain is intermittent in nature.  Has been nauseated but no vomiting.

## 2015-02-22 LAB — URINE CULTURE

## 2015-03-14 DIAGNOSIS — G8190 Hemiplegia, unspecified affecting unspecified side: Secondary | ICD-10-CM | POA: Diagnosis not present

## 2015-03-14 DIAGNOSIS — I10 Essential (primary) hypertension: Secondary | ICD-10-CM | POA: Diagnosis not present

## 2015-03-14 DIAGNOSIS — E1142 Type 2 diabetes mellitus with diabetic polyneuropathy: Secondary | ICD-10-CM | POA: Diagnosis not present

## 2015-03-14 DIAGNOSIS — E669 Obesity, unspecified: Secondary | ICD-10-CM | POA: Diagnosis not present

## 2015-03-18 DIAGNOSIS — I6350 Cerebral infarction due to unspecified occlusion or stenosis of unspecified cerebral artery: Secondary | ICD-10-CM | POA: Diagnosis not present

## 2015-03-18 DIAGNOSIS — M549 Dorsalgia, unspecified: Secondary | ICD-10-CM | POA: Diagnosis not present

## 2015-03-18 DIAGNOSIS — G8190 Hemiplegia, unspecified affecting unspecified side: Secondary | ICD-10-CM | POA: Diagnosis not present

## 2015-03-18 DIAGNOSIS — H1045 Other chronic allergic conjunctivitis: Secondary | ICD-10-CM | POA: Diagnosis not present

## 2015-04-15 DIAGNOSIS — I10 Essential (primary) hypertension: Secondary | ICD-10-CM | POA: Diagnosis not present

## 2015-04-15 DIAGNOSIS — E1142 Type 2 diabetes mellitus with diabetic polyneuropathy: Secondary | ICD-10-CM | POA: Diagnosis not present

## 2015-04-15 DIAGNOSIS — N390 Urinary tract infection, site not specified: Secondary | ICD-10-CM | POA: Diagnosis not present

## 2015-04-15 DIAGNOSIS — R300 Dysuria: Secondary | ICD-10-CM | POA: Diagnosis not present

## 2015-04-20 ENCOUNTER — Emergency Department (HOSPITAL_COMMUNITY)
Admission: EM | Admit: 2015-04-20 | Discharge: 2015-04-20 | Disposition: A | Discharge status: Home / Self Care | Payer: Commercial Managed Care - HMO | Attending: Emergency Medicine | Admitting: Emergency Medicine

## 2015-04-20 ENCOUNTER — Emergency Department (HOSPITAL_COMMUNITY): Payer: Commercial Managed Care - HMO

## 2015-04-20 ENCOUNTER — Encounter (HOSPITAL_COMMUNITY): Payer: Self-pay

## 2015-04-20 DIAGNOSIS — Z8673 Personal history of transient ischemic attack (TIA), and cerebral infarction without residual deficits: Secondary | ICD-10-CM | POA: Diagnosis not present

## 2015-04-20 DIAGNOSIS — Z79899 Other long term (current) drug therapy: Secondary | ICD-10-CM | POA: Diagnosis not present

## 2015-04-20 DIAGNOSIS — N390 Urinary tract infection, site not specified: Secondary | ICD-10-CM | POA: Diagnosis not present

## 2015-04-20 DIAGNOSIS — Z9049 Acquired absence of other specified parts of digestive tract: Secondary | ICD-10-CM | POA: Insufficient documentation

## 2015-04-20 DIAGNOSIS — Z8739 Personal history of other diseases of the musculoskeletal system and connective tissue: Secondary | ICD-10-CM | POA: Diagnosis not present

## 2015-04-20 DIAGNOSIS — Z794 Long term (current) use of insulin: Secondary | ICD-10-CM | POA: Diagnosis not present

## 2015-04-20 DIAGNOSIS — I10 Essential (primary) hypertension: Secondary | ICD-10-CM | POA: Diagnosis not present

## 2015-04-20 DIAGNOSIS — R52 Pain, unspecified: Secondary | ICD-10-CM

## 2015-04-20 DIAGNOSIS — K439 Ventral hernia without obstruction or gangrene: Secondary | ICD-10-CM | POA: Diagnosis not present

## 2015-04-20 DIAGNOSIS — N200 Calculus of kidney: Secondary | ICD-10-CM | POA: Diagnosis not present

## 2015-04-20 DIAGNOSIS — K219 Gastro-esophageal reflux disease without esophagitis: Secondary | ICD-10-CM | POA: Insufficient documentation

## 2015-04-20 DIAGNOSIS — R300 Dysuria: Secondary | ICD-10-CM | POA: Diagnosis not present

## 2015-04-20 DIAGNOSIS — R109 Unspecified abdominal pain: Secondary | ICD-10-CM

## 2015-04-20 DIAGNOSIS — E119 Type 2 diabetes mellitus without complications: Secondary | ICD-10-CM | POA: Insufficient documentation

## 2015-04-20 DIAGNOSIS — R1084 Generalized abdominal pain: Secondary | ICD-10-CM | POA: Diagnosis not present

## 2015-04-20 LAB — URINALYSIS, ROUTINE W REFLEX MICROSCOPIC
BILIRUBIN URINE: NEGATIVE
GLUCOSE, UA: 250 mg/dL — AB
Ketones, ur: NEGATIVE mg/dL
Nitrite: NEGATIVE
PH: 6.5 (ref 5.0–8.0)
Protein, ur: 100 mg/dL — AB
SPECIFIC GRAVITY, URINE: 1.025 (ref 1.005–1.030)

## 2015-04-20 LAB — COMPREHENSIVE METABOLIC PANEL
ALT: 13 U/L — AB (ref 14–54)
ANION GAP: 10 (ref 5–15)
AST: 18 U/L (ref 15–41)
Albumin: 3.3 g/dL — ABNORMAL LOW (ref 3.5–5.0)
Alkaline Phosphatase: 70 U/L (ref 38–126)
BUN: 20 mg/dL (ref 6–20)
CHLORIDE: 105 mmol/L (ref 101–111)
CO2: 24 mmol/L (ref 22–32)
CREATININE: 1.47 mg/dL — AB (ref 0.44–1.00)
Calcium: 8.8 mg/dL — ABNORMAL LOW (ref 8.9–10.3)
GFR, EST AFRICAN AMERICAN: 38 mL/min — AB (ref >60)
GFR, EST NON AFRICAN AMERICAN: 33 mL/min — AB (ref >60)
Glucose, Bld: 192 mg/dL — ABNORMAL HIGH (ref 65–99)
Potassium: 3.7 mmol/L (ref 3.5–5.1)
SODIUM: 139 mmol/L (ref 135–145)
Total Bilirubin: 0.6 mg/dL (ref 0.3–1.2)
Total Protein: 7.7 g/dL (ref 6.5–8.1)

## 2015-04-20 LAB — URINE MICROSCOPIC-ADD ON

## 2015-04-20 LAB — CBC WITH DIFFERENTIAL/PLATELET
Basophils Absolute: 0 10*3/uL (ref 0.0–0.1)
Basophils Relative: 0 %
EOS ABS: 0.2 10*3/uL (ref 0.0–0.7)
EOS PCT: 2 %
HCT: 37 % (ref 36.0–46.0)
Hemoglobin: 12.3 g/dL (ref 12.0–15.0)
LYMPHS ABS: 2.2 10*3/uL (ref 0.7–4.0)
LYMPHS PCT: 21 %
MCH: 30.1 pg (ref 26.0–34.0)
MCHC: 33.2 g/dL (ref 30.0–36.0)
MCV: 90.7 fL (ref 78.0–100.0)
MONOS PCT: 7 %
Monocytes Absolute: 0.7 10*3/uL (ref 0.1–1.0)
Neutro Abs: 7.5 10*3/uL (ref 1.7–7.7)
Neutrophils Relative %: 70 %
PLATELETS: 293 10*3/uL (ref 150–400)
RBC: 4.08 MIL/uL (ref 3.87–5.11)
RDW: 14.4 % (ref 11.5–15.5)
WBC: 10.6 10*3/uL — AB (ref 4.0–10.5)

## 2015-04-20 LAB — LIPASE, BLOOD: LIPASE: 26 U/L (ref 11–51)

## 2015-04-20 LAB — I-STAT CG4 LACTIC ACID, ED: LACTIC ACID, VENOUS: 1.31 mmol/L (ref 0.5–2.0)

## 2015-04-20 LAB — TROPONIN I: TROPONIN I: 0.03 ng/mL (ref <0.031)

## 2015-04-20 MED ORDER — DEXTROSE 5 % IV SOLN
1.0000 g | Freq: Once | INTRAVENOUS | Status: AC
  Administered 2015-04-20: 1 g via INTRAVENOUS
  Filled 2015-04-20: qty 10

## 2015-04-20 MED ORDER — CEPHALEXIN 500 MG PO CAPS: 500.0000 mg | ORAL_CAPSULE | Freq: Three times a day (TID) | ORAL | Status: DC

## 2015-04-20 NOTE — ED Notes (Signed)
Pt reports abd pain and generalized body aches x 1 week, states she went to pmd on Monday and was started on a new med but cannot remember the name of it...thinks is an antiobiotic.  Pt states she is also leaking urine and having some pain with urination

## 2015-04-20 NOTE — ED Provider Notes (Signed)
CSN: [id number]     Arrival date & time 04/20/15  0217 History   First MD Initiated Contact with Patient 04/20/15 0248     Chief Complaint  Patient presents with  . Abdominal Pain     (Consider location/radiation/quality/duration/timing/severity/associated sxs/prior Treatment) HPI Comments: Patient was several week history of abdominal pain that is epigastric and suprapubic. It is waxing and waning and comes and goes lasting for several minutes to hours at a time. It is similar to when she was in the ED in October and diagnosed with UTI. She denies any dysuria and has had some frequency and urgency. She has had the sensation of "something coming out of her" when she sits down. She denies any rectal pain or diarrhea. She denies any nausea or vomiting. She states she stopped her blood pressure medications last week to see if that would help the situation and then restarted them again 4 days ago. She states she is moves her bowels every other day has not had any vomiting. No chest pain or shortness of breath. No fever. No vaginal bleeding or discharge. Her PCP gave her an unknown medication 5 days ago for similar pain.  The history is provided by the patient and a relative.    Past Medical History  Diagnosis Date  . Diabetes mellitus without complication ([place])   . Hypertension   . GERD (gastroesophageal reflux disease)   . Arthritis   . CVA (cerebral infarction)   . Stroke [facility])    Past Surgical History  Procedure Laterality Date  . Cholecystectomy    . Back surgery    . Joint replacement      Rt hip, ????? sounds like just for fracture  . Cataract extraction w/phaco Left 02/28/2013    Procedure: CATARACT EXTRACTION PHACO AND INTRAOCULAR LENS PLACEMENT (IOL) CDE=15.60;  Surgeon: Elta Guadeloupe T. Gershon Crane, MD;  Location: [facility];  Service: Ophthalmology;  Laterality: Left;  . Colonoscopy  12/12/2003    RMR: Normal rectum.  Long capacious tortuous colon, colonic mucosa appeared normal  .  Esophagogastroduodenoscopy  12/28/2001    [id number] sliding hiatal hernia/. Three small bulbar ulcers, two with stigmata of bleeding and these were coagulated using heater probe unit   . Hip fracture surgery  2008  . Colonoscopy N/A 03/21/2014    Procedure: COLONOSCOPY;  Surgeon: [name], MD;  [facility];  Service: Endoscopy;  Laterality: N/A;  0730   . Esophagogastroduodenoscopy N/A 03/21/2014    Procedure: ESOPHAGOGASTRODUODENOSCOPY (EGD);  Surgeon: [name], MD;  [facility];  Service: Endoscopy;  Laterality: N/A;  . Savory dilation N/A 03/21/2014    Procedure: SAVORY DILATION;  Surgeon: [name], MD;  [facility];  Service: Endoscopy;  Laterality: N/A;  [name] dilation N/A 03/21/2014    Procedure: [name] DILATION;  Surgeon: [name], MD;  [facility];  Service: Endoscopy;  Laterality: N/A;  . Eye surgery     Family History  Problem Relation Age of Onset  . Crohn's disease Daughter   . Colon cancer Neg Hx    Social History  Substance Use Topics  . Smoking status: Never Smoker   . Smokeless tobacco: None  . Alcohol Use: No   OB History    No data available     Review of Systems  Constitutional: Negative for fever, activity change and appetite change.  HENT: Negative for congestion and rhinorrhea.   Respiratory: Negative for cough, chest tightness and  shortness of breath.   Cardiovascular: Negative for chest pain.  Gastrointestinal: Positive for nausea and abdominal pain. Negative for vomiting.  Genitourinary: Positive for dysuria and frequency. Negative for hematuria, vaginal bleeding and vaginal discharge.  Musculoskeletal: Negative for myalgias, back pain and arthralgias.  Skin: Negative for rash.  Neurological: Negative for dizziness, weakness and headaches.  A complete 10 system review of systems was obtained and all systems are negative except as noted in the HPI and PMH.      Allergies   Review of patient's allergies indicates no known allergies.  Home Medications   Prior to Admission medications   Medication Sig Start Date End Date Taking? Authorizing Provider  cephALEXin (KEFLEX) 500 MG capsule Take 1 capsule (500 mg total) by mouth 3 (three) times daily. 04/20/15   Ezequiel Essex, MD  furosemide (LASIX) 20 MG tablet Take 20 mg by mouth daily.    Historical Provider, MD  gabapentin (NEURONTIN) 300 MG capsule Take 300 mg by mouth 2 (two) times daily.  02/22/14   Historical Provider, MD  insulin detemir (LEVEMIR) 100 UNIT/ML injection Inject 80 Units into the skin at bedtime.     Historical Provider, MD  insulin lispro (HUMALOG) 100 UNIT/ML injection Inject 20 Units into the skin 3 (three) times daily before meals.    Historical Provider, MD  lansoprazole (PREVACID) 30 MG capsule Take 1 capsule (30 mg total) by mouth daily before breakfast. Patient not taking: Reported on 02/20/2015 02/28/14   Mahala Menghini, PA-C  lubiprostone (AMITIZA) 8 MCG capsule Take 1 capsule (8 mcg total) by mouth 2 (two) times daily with a meal. Patient not taking: Reported on 02/20/2015 02/28/14   Mahala Menghini, PA-C  pantoprazole (PROTONIX) 20 MG tablet Take 1 tablet (20 mg total) by mouth daily. 02/20/15   Milton Ferguson, MD  simvastatin (ZOCOR) 20 MG tablet Take 20 mg by mouth daily.    Historical Provider, MD  valsartan (DIOVAN) 320 MG tablet Take 1 tablet by mouth daily. 08/18/13   Historical Provider, MD   BP 168/80 mmHg  Pulse 96  Temp(Src) 98.2 F (36.8 C) (Oral)  Resp 20  Ht 5\' 5"  (1.651 m)  Wt 212 lb (96.163 kg)  BMI 35.28 kg/m2  SpO2 96% Physical Exam  Constitutional: She is oriented to person, place, and time. She appears well-developed and well-nourished. No distress.  HENT:  Head: Normocephalic and atraumatic.  Mouth/Throat: Oropharynx is clear and moist. No oropharyngeal exudate.  Eyes: Conjunctivae and EOM are normal. Pupils are equal, round, and reactive to light.  Neck:  Normal range of motion. Neck supple.  No meningismus.  Cardiovascular: Normal rate, regular rhythm, normal heart sounds and intact distal pulses.   No murmur heard. Pulmonary/Chest: Effort normal and breath sounds normal. No respiratory distress.  Abdominal: Soft. There is tenderness. There is no rebound and no guarding.  Mild diffuse tenderness No guarding or rebound  Genitourinary:  Patient declines GU or rectal exam  Musculoskeletal: Normal range of motion. She exhibits no edema or tenderness.  Neurological: She is alert and oriented to person, place, and time. No cranial nerve deficit. She exhibits normal muscle tone. Coordination normal.  No ataxia on finger to nose bilaterally. No pronator drift. 5/5 strength throughout. CN 2-12 intact.Equal grip strength. Sensation intact.   Skin: Skin is warm.  Psychiatric: She has a normal mood and affect. Her behavior is normal.  Nursing note and vitals reviewed.   ED Course  Procedures (including critical care time) Labs Review  Labs Reviewed  URINALYSIS, ROUTINE W REFLEX MICROSCOPIC (NOT AT [facility]) - Abnormal; Notable for the following:    Glucose, UA 250 (*)    Hgb urine dipstick MODERATE (*)    Protein, ur 100 (*)    Leukocytes, UA SMALL (*)    All other components within normal limits  CBC WITH DIFFERENTIAL/PLATELET - Abnormal; Notable for the following:    WBC 10.6 (*)    All other components within normal limits  COMPREHENSIVE METABOLIC PANEL - Abnormal; Notable for the following:    Glucose, Bld 192 (*)    Creatinine, Ser 1.47 (*)    Calcium 8.8 (*)    Albumin 3.3 (*)    ALT 13 (*)    GFR calc non Af Amer 33 (*)    GFR calc Af Amer 38 (*)    All other components within normal limits  URINE MICROSCOPIC-ADD ON - Abnormal; Notable for the following:    Squamous Epithelial / LPF 6-30 (*)    Bacteria, UA FEW (*)    All other components within normal limits  URINE CULTURE  LIPASE, BLOOD  TROPONIN I  I-STAT CG4 LACTIC ACID, ED     Imaging Review Ct Abdomen Pelvis Wo Contrast  04/20/2015  CLINICAL DATA:  Intermittent epigastric pain for 1 week. EXAM: CT ABDOMEN AND PELVIS WITHOUT CONTRAST TECHNIQUE: Multidetector CT imaging of the abdomen and pelvis was performed following the standard protocol without IV contrast. COMPARISON:  02/20/2015 FINDINGS: There are unremarkable unenhanced appearances of the liver, spleen, pancreas and adrenals. There is prior cholecystectomy. There are 2 mm calculi in the renal collecting systems bilaterally. There is no ureteral calculus. There is no hydronephrosis or ureteral dilatation. Appendix is normal. Bowel is unremarkable. Uterus and ovaries are unremarkable. Abdominal aorta is normal in caliber with moderate atherosclerotic calcification. No acute inflammatory changes are evident in the abdomen or pelvis. There is no ascites. There is no adenopathy. There is a ventral hernia containing fat and unobstructed small bowel. No significant abnormality in the lower chest No significant skeletal lesion. There is moderate lumbar degenerative disc disease at multiple levels. IMPRESSION: 1. No acute findings are evident in the abdomen or pelvis. 2. Bilateral nephrolithiasis 3. Ventral hernia containing fat and unobstructed small bowel. Electronically Signed   By: Andreas Newport M.D.   On: 04/20/2015 06:41   I have personally reviewed and evaluated these images and lab results as part of my medical decision-making.   EKG Interpretation   Date/Time:  Saturday April 20 2015 07:19:37 EST Ventricular Rate:  93 PR Interval:  179 QRS Duration: 136 QT Interval:  437 QTC Calculation: 544 R Axis:   -54 Text Interpretation:  Sinus rhythm Left bundle branch block Baseline  wander in lead(s) II aVR aVF V2 No significant change was found Confirmed  by [place] ([id number]) on 04/20/2015 7:24:01 AM      MDM   Final diagnoses:  Abdominal pain, unspecified abdominal location  Urinary tract  infection without hematuria, site unspecified   abdominal pain with dysuria and frequency.  UA positive for infection but contaminated. Culture sent.  Cr slightly worse than baseline.  IV fluids and IV Rocephin given in the ED. EKG shows unchanged left bundle branch block. Troponin negative.  Patient has reproducible epigastric and lower abdominal pain. She declines GU and pelvic exam. Her gallbladder is gone.  Workup is reassuring. CT without acute finding to explain her pain. Small ventral hernia.  She is tolerating by mouth in  the ED. Treat possible UTI. Follow-up with PCP and gastroenterology. Return precautions discussed.   Ezequiel Essex, MD 04/20/15 [phone]

## 2015-04-20 NOTE — Discharge Instructions (Signed)
Abdominal Pain, Adult  Follow up with your doctor. Return to the ED if you develop new or worsening symptoms. Many things can cause abdominal pain. Usually, abdominal pain is not caused by a disease and will improve without treatment. It can often be observed and treated at home. Your health care provider will do a physical exam and possibly order blood tests and X-rays to help determine the seriousness of your pain. However, in many cases, more time must pass before a clear cause of the pain can be found. Before that point, your health care provider may not know if you need more testing or further treatment. HOME CARE INSTRUCTIONS Monitor your abdominal pain for any changes. The following actions may help to alleviate any discomfort you are experiencing:  Only take over-the-counter or prescription medicines as directed by your health care provider.  Do not take laxatives unless directed to do so by your health care provider.  Try a clear liquid diet (broth, tea, or water) as directed by your health care provider. Slowly move to a bland diet as tolerated. SEEK MEDICAL CARE IF:  You have unexplained abdominal pain.  You have abdominal pain associated with nausea or diarrhea.  You have pain when you urinate or have a bowel movement.  You experience abdominal pain that wakes you in the night.  You have abdominal pain that is worsened or improved by eating food.  You have abdominal pain that is worsened with eating fatty foods.  You have a fever. SEEK IMMEDIATE MEDICAL CARE IF:  Your pain does not go away within 2 hours.  You keep throwing up (vomiting).  Your pain is felt only in portions of the abdomen, such as the right side or the left lower portion of the abdomen.  You pass bloody or black tarry stools. MAKE SURE YOU:  Understand these instructions.  Will watch your condition.  Will get help right away if you are not doing well or get worse.   This information is not  intended to replace advice given to you by your health care provider. Make sure you discuss any questions you have with your health care provider.   Document Released: 02/11/2005 Document Revised: 01/23/2015 Document Reviewed: 01/11/2013 Elsevier Interactive Patient Education [employer].

## 2015-04-23 ENCOUNTER — Encounter: Payer: Self-pay | Admitting: Internal Medicine

## 2015-04-23 LAB — URINE CULTURE

## 2015-04-24 NOTE — Progress Notes (Signed)
ED Antimicrobial Stewardship Positive Culture Follow Up   MARIVI DURRENCE is an 80 y.o. female who presented to [facility] on 04/20/2015 with a chief complaint of  Chief Complaint  Patient presents with  . Abdominal Pain    Recent Results (from the past 720 hour(s))  Urine culture     Status: None   Collection Time: 04/20/15  2:54 AM  Result Value Ref Range Status   Specimen Description URINE, CLEAN CATCH  Final   Special Requests NONE  Final   Culture   Final    40,000 COLONIES/ml STAPHYLOCOCCUS SPECIES (COAGULASE NEGATIVE) Performed at [facility]    Report Status 04/23/2015 FINAL  Final   Organism ID, Bacteria STAPHYLOCOCCUS SPECIES (COAGULASE NEGATIVE)  Final      Susceptibility   Staphylococcus species (coagulase negative) - MIC*    CIPROFLOXACIN >=8 RESISTANT Resistant     GENTAMICIN <=0.5 SENSITIVE Sensitive     NITROFURANTOIN <=16 SENSITIVE Sensitive     OXACILLIN >=4 RESISTANT Resistant     TETRACYCLINE 4 SENSITIVE Sensitive     VANCOMYCIN 1 SENSITIVE Sensitive     TRIMETH/SULFA 40 SENSITIVE Sensitive     CLINDAMYCIN <=0.25 SENSITIVE Sensitive     RIFAMPIN <=0.5 SENSITIVE Sensitive     Inducible Clindamycin NEGATIVE Sensitive     * 40,000 COLONIES/ml STAPHYLOCOCCUS SPECIES (COAGULASE NEGATIVE)    [x]  Treated with cephalexin, organism resistant to prescribed antimicrobial []  Patient discharged originally without antimicrobial agent and treatment is now indicated  New antibiotic prescription: Bactrim DS 1 tab BID x 3 days  ED Provider: Waynetta Pean, PA-C   Wynell Balloon 04/24/2015, 8:52 AM Infectious Diseases Pharmacist Phone# [phone]

## 2015-04-25 ENCOUNTER — Telehealth (HOSPITAL_BASED_OUTPATIENT_CLINIC_OR_DEPARTMENT_OTHER): Payer: Self-pay | Admitting: Emergency Medicine

## 2015-04-25 NOTE — Telephone Encounter (Signed)
Post ED Visit - Positive Culture Follow-up: Successful Patient Follow-Up  Culture assessed and recommendations reviewed by: []  Elenor Quinones, Pharm.D. []  Heide Guile, Pharm.D., BCPS [x]  Parks Neptune, Pharm.D. []  Alycia Rossetti, Pharm.D., BCPS []  Nason, Pharm.D., BCPS, AAHIVP []  Legrand Como, Pharm.D., BCPS, AAHIVP []  Cassie Stewart, Pharm.D. []  Stephens November, Pharm.D.  Positive urine culture Staph  []  Patient discharged without antimicrobial prescription and treatment is now indicated [x]  Organism is resistant to prescribed ED discharge antimicrobial []  Patient with positive blood cultures  Changes discussed with ED provider: Will Porfirio Mylar PA New antibiotic prescription d/c keflex, start Bactrim DS bid x 3 days  Attempting to contact pt   Hazle Nordmann 04/25/2015, 4:05 PM

## 2015-04-26 ENCOUNTER — Telehealth (HOSPITAL_COMMUNITY): Payer: Self-pay

## 2015-04-26 NOTE — Telephone Encounter (Signed)
Spoke with pt. Informed of labs. Bactrim DS 1 bid x 3 days per Will Dansie was called to [place] per pt request. Spoke with scott

## 2015-05-02 ENCOUNTER — Emergency Department (HOSPITAL_COMMUNITY): Payer: Commercial Managed Care - HMO

## 2015-05-02 ENCOUNTER — Emergency Department (HOSPITAL_COMMUNITY)
Admission: EM | Admit: 2015-05-02 | Discharge: 2015-05-02 | Disposition: A | Discharge status: Home / Self Care | Payer: Commercial Managed Care - HMO | Attending: Emergency Medicine | Admitting: Emergency Medicine

## 2015-05-02 ENCOUNTER — Encounter (HOSPITAL_COMMUNITY): Payer: Self-pay | Admitting: Emergency Medicine

## 2015-05-02 DIAGNOSIS — Z8673 Personal history of transient ischemic attack (TIA), and cerebral infarction without residual deficits: Secondary | ICD-10-CM | POA: Insufficient documentation

## 2015-05-02 DIAGNOSIS — E119 Type 2 diabetes mellitus without complications: Secondary | ICD-10-CM | POA: Insufficient documentation

## 2015-05-02 DIAGNOSIS — I10 Essential (primary) hypertension: Secondary | ICD-10-CM | POA: Diagnosis not present

## 2015-05-02 DIAGNOSIS — Z794 Long term (current) use of insulin: Secondary | ICD-10-CM | POA: Diagnosis not present

## 2015-05-02 DIAGNOSIS — K219 Gastro-esophageal reflux disease without esophagitis: Secondary | ICD-10-CM | POA: Diagnosis not present

## 2015-05-02 DIAGNOSIS — Z792 Long term (current) use of antibiotics: Secondary | ICD-10-CM | POA: Diagnosis not present

## 2015-05-02 DIAGNOSIS — R079 Chest pain, unspecified: Secondary | ICD-10-CM | POA: Diagnosis not present

## 2015-05-02 DIAGNOSIS — Z79899 Other long term (current) drug therapy: Secondary | ICD-10-CM | POA: Diagnosis not present

## 2015-05-02 DIAGNOSIS — M1990 Unspecified osteoarthritis, unspecified site: Secondary | ICD-10-CM | POA: Insufficient documentation

## 2015-05-02 LAB — COMPREHENSIVE METABOLIC PANEL
ALK PHOS: 61 U/L (ref 38–126)
ALT: 16 U/L (ref 14–54)
ANION GAP: 8 (ref 5–15)
AST: 20 U/L (ref 15–41)
Albumin: 3.3 g/dL — ABNORMAL LOW (ref 3.5–5.0)
BILIRUBIN TOTAL: 0.3 mg/dL (ref 0.3–1.2)
BUN: 32 mg/dL — ABNORMAL HIGH (ref 6–20)
CALCIUM: 8.9 mg/dL (ref 8.9–10.3)
CO2: 23 mmol/L (ref 22–32)
CREATININE: 1.91 mg/dL — AB (ref 0.44–1.00)
Chloride: 105 mmol/L (ref 101–111)
GFR, EST AFRICAN AMERICAN: 28 mL/min — AB (ref >60)
GFR, EST NON AFRICAN AMERICAN: 24 mL/min — AB (ref >60)
Glucose, Bld: 113 mg/dL — ABNORMAL HIGH (ref 65–99)
Potassium: 4.3 mmol/L (ref 3.5–5.1)
SODIUM: 136 mmol/L (ref 135–145)
TOTAL PROTEIN: 7.5 g/dL (ref 6.5–8.1)

## 2015-05-02 LAB — URINALYSIS, ROUTINE W REFLEX MICROSCOPIC
BILIRUBIN URINE: NEGATIVE
GLUCOSE, UA: NEGATIVE mg/dL
HGB URINE DIPSTICK: NEGATIVE
Ketones, ur: NEGATIVE mg/dL
Leukocytes, UA: NEGATIVE
Nitrite: NEGATIVE
PROTEIN: NEGATIVE mg/dL
Specific Gravity, Urine: 1.015 (ref 1.005–1.030)
pH: 5 (ref 5.0–8.0)

## 2015-05-02 LAB — CBC
HEMATOCRIT: 38 % (ref 36.0–46.0)
HEMOGLOBIN: 12.2 g/dL (ref 12.0–15.0)
MCH: 29.3 pg (ref 26.0–34.0)
MCHC: 32.1 g/dL (ref 30.0–36.0)
MCV: 91.3 fL (ref 78.0–100.0)
Platelets: 278 10*3/uL (ref 150–400)
RBC: 4.16 MIL/uL (ref 3.87–5.11)
RDW: 14.6 % (ref 11.5–15.5)
WBC: 9 10*3/uL (ref 4.0–10.5)

## 2015-05-02 LAB — I-STAT TROPONIN, ED
TROPONIN I, POC: 0.02 ng/mL (ref 0.00–0.08)
TROPONIN I, POC: 0.02 ng/mL (ref 0.00–0.08)

## 2015-05-02 MED ORDER — ASPIRIN 81 MG PO CHEW
324.0000 mg | CHEWABLE_TABLET | Freq: Once | ORAL | Status: AC
  Administered 2015-05-02: 324 mg via ORAL
  Filled 2015-05-02: qty 4

## 2015-05-02 MED ORDER — GI COCKTAIL ~~LOC~~
30.0000 mL | Freq: Once | ORAL | Status: AC
  Administered 2015-05-02: 30 mL via ORAL
  Filled 2015-05-02: qty 30

## 2015-05-02 MED ORDER — PANTOPRAZOLE SODIUM 20 MG PO TBEC: 20.0000 mg | DELAYED_RELEASE_TABLET | Freq: Every day | ORAL | Status: DC

## 2015-05-02 NOTE — ED Provider Notes (Signed)
[name] is a 80 y.o. female who presents c/o intermittent central chest pain x 3 weeks, worse with inspiration.  Pt reports this episode began this morning around 1 am.  Denies cardiac Hx.  Intermittent cough.    General: Awake, alert  HEENT: Atraumatic  Resp: Normal effort  Abd: Nondistended  MSK: Moves all extremities  Skin: Warm and dry  BP 138/73 mmHg  Pulse 100  Temp(Src) 98.8 F (37.1 C) (Oral)  Resp 18  SpO2 98%  MSE was initiated and I personally evaluated the patient and placed orders (if any) at  1:49 PM on May 02, 2015.    The patient appears stable so that the remainder of the MSE may be completed by another provider.  Discussed with the patient that exiting the department prior to completion of the work-up is AMA and there is no guarantee that there are no emergency medical conditions present.     Jarrett Soho Paizleigh Wilds, PA-C 05/02/15 [place], MD 05/02/15 [phone]

## 2015-05-02 NOTE — ED Notes (Signed)
Daughter states pt has gallstones that occasionally act up--

## 2015-05-02 NOTE — Discharge Instructions (Signed)
Chest Pain Observation °It is often hard to give a specific diagnosis for the cause of chest pain. Among other possibilities your symptoms might be caused by inadequate oxygen delivery to your heart (angina). Angina that is not treated or evaluated can lead to a heart attack (myocardial infarction) or death. °Blood tests, electrocardiograms, and X-rays may have been done to help determine a possible cause of your chest pain. After evaluation and observation, your health care provider has determined that it is unlikely your pain was caused by an unstable condition that requires hospitalization. However, a full evaluation of your pain may need to be completed, with additional diagnostic testing as directed. It is very important to keep your follow-up appointments. Not keeping your follow-up appointments could result in permanent heart damage, disability, or death. If there is any problem keeping your follow-up appointments, you must call your health care provider. °HOME CARE INSTRUCTIONS  °Due to the slight chance that your pain could be angina, it is important to follow your health care provider's treatment plan and also maintain a healthy lifestyle: °· Maintain or work toward achieving a healthy weight. °· Stay physically active and exercise regularly. °· Decrease your salt intake. °· Eat a balanced, healthy diet. Talk to a dietitian to learn about heart-healthy foods. °· Increase your fiber intake by including whole grains, vegetables, fruits, and nuts in your diet. °· Avoid situations that cause stress, anger, or depression. °· Take medicines as advised by your health care provider. Report any side effects to your health care provider. Do not stop medicines or adjust the dosages on your own. °· Quit smoking. Do not use nicotine patches or gum until you check with your health care provider. °· Keep your blood pressure, blood sugar, and cholesterol levels within normal limits. °· Limit alcohol intake to no more than  1 drink per day for women who are not pregnant and 2 drinks per day for men. °· Do not abuse drugs. °SEEK IMMEDIATE MEDICAL CARE IF: °You have severe chest pain or pressure which may include symptoms such as: °· You feel pain or pressure in your arms, neck, jaw, or back. °· You have severe back or abdominal pain, feel sick to your stomach (nauseous), or throw up (vomit). °· You are sweating profusely. °· You are having a fast or irregular heartbeat. °· You feel short of breath while at rest. °· You notice increasing shortness of breath during rest, sleep, or with activity. °· You have chest pain that does not get better after rest or after taking your usual medicine. °· You wake from sleep with chest pain. °· You are unable to sleep because you cannot breathe. °· You develop a frequent cough or you are coughing up blood. °· You feel dizzy, faint, or experience extreme fatigue. °· You develop severe weakness, dizziness, fainting, or chills. °Any of these symptoms may represent a serious problem that is an emergency. Do not wait to see if the symptoms will go away. Call your local emergency services (911 in the [place] Do not drive yourself to the hospital. °MAKE SURE YOU: °· Understand these instructions. °· Will watch your condition. °· Will get help right away if you are not doing well or get worse. °  °This information is not intended to replace advice given to you by your health care provider. Make sure you discuss any questions you have with your health care provider. °  °Document Released: 06/06/2010 Document Revised: 05/09/2013 Document Reviewed: 11/03/2012 °Elsevier Interactive Patient   Education 2016 [place].  Gastroesophageal Reflux Disease, Adult Normally, food travels down the esophagus and stays in the stomach to be digested. However, when a person has gastroesophageal reflux disease (GERD), food and stomach acid move back up into the esophagus. When this happens, the esophagus becomes sore and  inflamed. Over time, GERD can create small holes (ulcers) in the lining of the esophagus.  CAUSES This condition is caused by a problem with the muscle between the esophagus and the stomach (lower esophageal sphincter, or LES). Normally, the LES muscle closes after food passes through the esophagus to the stomach. When the LES is weakened or abnormal, it does not close properly, and that allows food and stomach acid to go back up into the esophagus. The LES can be weakened by certain dietary substances, medicines, and medical conditions, including:  Tobacco use.  Pregnancy.  Having a hiatal hernia.  Heavy alcohol use.  Certain foods and beverages, such as coffee, chocolate, onions, and peppermint. RISK FACTORS This condition is more likely to develop in:  People who have an increased body weight.  People who have connective tissue disorders.  People who use NSAID medicines. SYMPTOMS Symptoms of this condition include:  Heartburn.  Difficult or painful swallowing.  The feeling of having a lump in the throat.  Abitter taste in the mouth.  Bad breath.  Having a large amount of saliva.  Having an upset or bloated stomach.  Belching.  Chest pain.  Shortness of breath or wheezing.  Ongoing (chronic) cough or a night-time cough.  Wearing away of tooth enamel.  Weight loss. Different conditions can cause chest pain. Make sure to see your health care provider if you experience chest pain. DIAGNOSIS Your health care provider will take a medical history and perform a physical exam. To determine if you have mild or severe GERD, your health care provider may also monitor how you respond to treatment. You may also have other tests, including:  An endoscopy toexamine your stomach and esophagus with a small camera.  A test thatmeasures the acidity level in your esophagus.  A test thatmeasures how much pressure is on your esophagus.  A barium swallow or modified barium  swallow to show the shape, size, and functioning of your esophagus. TREATMENT The goal of treatment is to help relieve your symptoms and to prevent complications. Treatment for this condition may vary depending on how severe your symptoms are. Your health care provider may recommend:  Changes to your diet.  Medicine.  Surgery. HOME CARE INSTRUCTIONS Diet  Follow a diet as recommended by your health care provider. This may involve avoiding foods and drinks such as:  Coffee and tea (with or without caffeine).  Drinks that containalcohol.  Energy drinks and sports drinks.  Carbonated drinks or sodas.  Chocolate and cocoa.  Peppermint and mint flavorings.  Garlic and onions.  Horseradish.  Spicy and acidic foods, including peppers, chili powder, curry powder, vinegar, hot sauces, and barbecue sauce.  Citrus fruit juices and citrus fruits, such as oranges, lemons, and limes.  Tomato-based foods, such as red sauce, chili, salsa, and pizza with red sauce.  Fried and fatty foods, such as donuts, french fries, potato chips, and high-fat dressings.  High-fat meats, such as hot dogs and fatty cuts of red and white meats, such as rib eye steak, sausage, ham, and bacon.  High-fat dairy items, such as whole milk, butter, and cream cheese.  Eat small, frequent meals instead of large meals.  Avoid drinking  large amounts of liquid with your meals.  Avoid eating meals during the 2-3 hours before bedtime.  Avoid lying down right after you eat.  Do not exercise right after you eat. General Instructions  Pay attention to any changes in your symptoms.  Take over-the-counter and prescription medicines only as told by your health care provider. Do not take aspirin, ibuprofen, or other NSAIDs unless your health care provider told you to do so.  Do not use any tobacco products, including cigarettes, chewing tobacco, and e-cigarettes. If you need help quitting, ask your health care  provider.  Wear loose-fitting clothing. Do not wear anything tight around your waist that causes pressure on your abdomen.  Raise (elevate) the head of your bed 6 inches (15cm).  Try to reduce your stress, such as with yoga or meditation. If you need help reducing stress, ask your health care provider.  If you are overweight, reduce your weight to an amount that is healthy for you. Ask your health care provider for guidance about a safe weight loss goal.  Keep all follow-up visits as told by your health care provider. This is important. SEEK MEDICAL CARE IF:  You have new symptoms.  You have unexplained weight loss.  You have difficulty swallowing, or it hurts to swallow.  You have wheezing or a persistent cough.  Your symptoms do not improve with treatment.  You have a hoarse voice. SEEK IMMEDIATE MEDICAL CARE IF:  You have pain in your arms, neck, jaw, teeth, or back.  You feel sweaty, dizzy, or light-headed.  You have chest pain or shortness of breath.  You vomit and your vomit looks like blood or coffee grounds.  You faint.  Your stool is bloody or black.  You cannot swallow, drink, or eat.   This information is not intended to replace advice given to you by your health care provider. Make sure you discuss any questions you have with your health care provider.   Document Released: 02/11/2005 Document Revised: 01/23/2015 Document Reviewed: 08/29/2014 Elsevier Interactive Patient Education [employer].

## 2015-05-02 NOTE — ED Notes (Signed)
C/o burning feeling in epigastric area-- states "when it pains, I can't be still" instructed to use call bell when having pain.

## 2015-05-02 NOTE — ED Provider Notes (Signed)
CSN: [id number]     Arrival date & time 05/02/15  1338 History   First MD Initiated Contact with Patient 05/02/15 1459     Chief Complaint  Patient presents with  . Chest Pain   HPI  [name] is a 80 year old female with PMHx of DM, HTN, GERD, CVA and arthritis presenting with chest pain. She reports the pain has been intermittent over the past month. The pain is located over the lower portion of the sternum immediately above the xiphoid process. The pain does not radiate. States that the pain is most severe at night and when laying down flat. She states that she feels the pain almost every day. The pain is described as an aching and burning. The pain is not exertional or worsened by inspiration. The pain is not associated with diaphoresis, dizziness, neck pain, SOB or nausea. She states her most recent episode started this morning at 1 AM and is gradually improving. She states she is "only feeling a little bit of the burning". She has not tried any over-the-counter medications for the pain. She does have a history of acid reflux but denies taking PPIs or other acid reducers. She denies a personal or family cardiac history. Her daughter is in the room and reports that she has a history of gallstones which "act up sometimes". Denies fevers, chills, headache, dizziness, syncope, neck pain, sore throat, cough, SOB, abdominal pain, vomiting, dysuria or myalgias. She notes that she felt nauseous two days ago but this has resolved.    Past Medical History  Diagnosis Date  . Diabetes mellitus without complication ([place])   . Hypertension   . GERD (gastroesophageal reflux disease)   . Arthritis   . CVA (cerebral infarction)   . Stroke [facility])    Past Surgical History  Procedure Laterality Date  . Cholecystectomy    . Back surgery    . Joint replacement      Rt hip, ????? sounds like just for fracture  . Cataract extraction w/phaco Left 02/28/2013    Procedure: CATARACT EXTRACTION PHACO AND  INTRAOCULAR LENS PLACEMENT (IOL) CDE=15.60;  Surgeon: Elta Guadeloupe T. Gershon Crane, MD;  Location: [facility] ORS;  Service: Ophthalmology;  Laterality: Left;  . Colonoscopy  12/12/2003    RMR: Normal rectum.  Long capacious tortuous colon, colonic mucosa appeared normal  . Esophagogastroduodenoscopy  12/28/2001    [id number] sliding hiatal hernia/. Three small bulbar ulcers, two with stigmata of bleeding and these were coagulated using heater probe unit   . Hip fracture surgery  2008  . Colonoscopy N/A 03/21/2014    Procedure: COLONOSCOPY;  Surgeon: [name], MD;  Location: [facility];  Service: Endoscopy;  Laterality: N/A;  0730   . Esophagogastroduodenoscopy N/A 03/21/2014    Procedure: ESOPHAGOGASTRODUODENOSCOPY (EGD);  Surgeon: [name], MD;  Location: [facility];  Service: Endoscopy;  Laterality: N/A;  . Savory dilation N/A 03/21/2014    Procedure: SAVORY DILATION;  Surgeon: [name], MD;  Location: [facility];  Service: Endoscopy;  Laterality: N/A;  [name] dilation N/A 03/21/2014    Procedure: [name] DILATION;  Surgeon: [name], MD;  Location: [facility];  Service: Endoscopy;  Laterality: N/A;  . Eye surgery     Family History  Problem Relation Age of Onset  . Crohn's disease Daughter   . Colon cancer Neg Hx    Social History  Substance Use Topics  . Smoking status: Never Smoker   . Smokeless tobacco:  None  . Alcohol Use: No   OB History    No data available     Review of Systems  Constitutional: Negative for fever and chills.  Respiratory: Negative for cough, shortness of breath and wheezing.   Cardiovascular: Positive for chest pain. Negative for palpitations.  Gastrointestinal: Negative for nausea, vomiting and abdominal pain.  Musculoskeletal: Negative for myalgias, back pain and neck pain.  Neurological: Negative for dizziness, syncope, weakness and headaches.  All other systems reviewed and are negative.     Allergies  Review of patient's  allergies indicates no known allergies.  Home Medications   Prior to Admission medications   Medication Sig Start Date End Date Taking? Authorizing Provider  cephALEXin (KEFLEX) 500 MG capsule Take 1 capsule (500 mg total) by mouth 3 (three) times daily. 04/20/15  Yes Ezequiel Essex, MD  furosemide (LASIX) 20 MG tablet Take 20 mg by mouth daily.   Yes Historical Provider, MD  gabapentin (NEURONTIN) 300 MG capsule Take 300 mg by mouth 2 (two) times daily.  02/22/14  Yes Historical Provider, MD  ibuprofen (ADVIL,MOTRIN) 200 MG tablet Take 200 mg by mouth every 6 (six) hours as needed for mild pain.   Yes Historical Provider, MD  insulin detemir (LEVEMIR) 100 UNIT/ML injection Inject 80 Units into the skin at bedtime.    Yes Historical Provider, MD  insulin lispro (HUMALOG) 100 UNIT/ML injection Inject 20 Units into the skin 3 (three) times daily before meals.   Yes Historical Provider, MD  simvastatin (ZOCOR) 20 MG tablet Take 20 mg by mouth daily.   Yes Historical Provider, MD  traMADol (ULTRAM) 50 MG tablet Take 50 mg by mouth 2 (two) times daily.   Yes Historical Provider, MD  valsartan (DIOVAN) 320 MG tablet Take 1 tablet by mouth daily. 08/18/13  Yes Historical Provider, MD  lansoprazole (PREVACID) 30 MG capsule Take 1 capsule (30 mg total) by mouth daily before breakfast. Patient not taking: Reported on 02/20/2015 02/28/14   Mahala Menghini, PA-C  lubiprostone (AMITIZA) 8 MCG capsule Take 1 capsule (8 mcg total) by mouth 2 (two) times daily with a meal. Patient not taking: Reported on 02/20/2015 02/28/14   Mahala Menghini, PA-C  pantoprazole (PROTONIX) 20 MG tablet Take 1 tablet (20 mg total) by mouth daily. Patient not taking: Reported on 05/02/2015 02/20/15   Milton Ferguson, MD  pantoprazole (PROTONIX) 20 MG tablet Take 1 tablet (20 mg total) by mouth daily. 05/02/15   Kalee Mcclenathan, PA-C   BP 138/51 mmHg  Pulse 68  Temp(Src) 98.8 F (37.1 C) (Oral)  Resp 14  Ht 5\' 5"  (1.651 m)  Wt 96.48 kg   BMI 35.40 kg/m2  SpO2 98% Physical Exam  Constitutional: She is oriented to person, place, and time. She appears well-developed and well-nourished. No distress.  HENT:  Head: Normocephalic and atraumatic.  Mouth/Throat: Oropharynx is clear and moist.  Eyes: Conjunctivae are normal. Right eye exhibits no discharge. Left eye exhibits no discharge. No scleral icterus.  Neck: Normal range of motion. Neck supple.  Cardiovascular: Normal rate, regular rhythm and normal heart sounds.   Pulmonary/Chest: Effort normal and breath sounds normal. No respiratory distress. She has no wheezes. She has no rales. She exhibits no tenderness.    Chest pain is not reproducible  Abdominal: Soft. She exhibits no distension. There is no tenderness. There is no rebound and no guarding.  Musculoskeletal: Normal range of motion.  Moves all extremities spontaneously  Neurological: She is alert and oriented  to person, place, and time. Coordination normal.  Skin: Skin is warm and dry. She is not diaphoretic.  Psychiatric: She has a normal mood and affect. Her behavior is normal.  Nursing note and vitals reviewed.   ED Course  Procedures (including critical care time) Labs Review Labs Reviewed  COMPREHENSIVE METABOLIC PANEL - Abnormal; Notable for the following:    Glucose, Bld 113 (*)    BUN 32 (*)    Creatinine, Ser 1.91 (*)    Albumin 3.3 (*)    GFR calc non Af Amer 24 (*)    GFR calc Af Amer 28 (*)    All other components within normal limits  URINALYSIS, ROUTINE W REFLEX MICROSCOPIC (NOT AT [facility]) - Abnormal; Notable for the following:    APPearance HAZY (*)    All other components within normal limits  CBC  I-STAT TROPOININ, ED  Randolm Idol, ED    Imaging Review Dg Chest 2 View  05/02/2015  CLINICAL DATA:  Chest pain for 3 weeks EXAM: CHEST  2 VIEW COMPARISON:  08/24/2014 FINDINGS: Cardiomediastinal silhouette is stable. No acute infiltrate or pleural effusion. No pulmonary edema. Mild  degenerative changes mid and lower thoracic spine. IMPRESSION: No active cardiopulmonary disease. Mild degenerative changes thoracic spine. Electronically Signed   By: Lahoma Crocker M.D.   On: 05/02/2015 14:13   I have personally reviewed and evaluated these images and lab results as part of my medical decision-making.   EKG Interpretation   Date/Time:  Thursday May 02 2015 13:44:17 EST Ventricular Rate:  98 PR Interval:  174 QRS Duration: 128 QT Interval:  398 QTC Calculation: 508 R Axis:   -43 Text Interpretation:  Normal sinus rhythm Left axis deviation Left bundle  branch block Abnormal ECG Confirmed by Jeneen Rinks  MD, [place] ([id number]) on  05/02/2015 2:42:48 PM      4:30 - Pt reports symptoms have resolved after receiving GI cocktail  MDM   Final diagnoses:  Chest pain, unspecified chest pain type  Gastroesophageal reflux disease, esophagitis presence not specified    Patient presenting with atypical chest pain x 3 weeks. Pain is intermittent, burning, aching and does not have exacerbating factors or associated symptoms. Patient is to be discharged with recommendation to follow up with PCP or their gastroenterologist in regards to today's hospital visit. VSS. Heart RRR without murmur. Lungs CTAB. Delta troponin negative with non-iscehmic ECG. CXR without abnormality. Wells criteria score of 0. Presentation and workup is not consistent with cardiac or pulmonary pain origin. Likely due to her chronic, untreated GERD as documented by her gastroenterologist. She is supposed to be taking pantoprazole but is noncompliant with this. Pt has been advised to return to the ED if CP becomes exertional, associated with diaphoresis or nausea, radiates to left jaw/arm, worsens or becomes concerning in any way. Pt appears reliable for follow up and is agreeable to discharge.   Case has been discussed with and seen by Dr. Canary Brim who agrees with the above plan to discharge.      Josephina Gip,  PA-C 05/02/15 [place], MD 05/02/15 [phone]

## 2015-05-02 NOTE — ED Notes (Signed)
Pt sts mid sternal CP x 3 weeks worse with inspiration and cough; pt sts was worse today

## 2015-05-02 NOTE — ED Notes (Signed)
Pt. Left with all belongings. Discharge instructions were reviewed and all questions were answered.  

## 2015-05-08 ENCOUNTER — Ambulatory Visit (INDEPENDENT_AMBULATORY_CARE_PROVIDER_SITE_OTHER): Payer: Commercial Managed Care - HMO | Admitting: Gastroenterology

## 2015-05-08 ENCOUNTER — Encounter: Payer: Self-pay | Admitting: Gastroenterology

## 2015-05-08 VITALS — BP 188/88 | Temp 98.0°F | Ht 65.0 in | Wt 199.6 lb

## 2015-05-08 DIAGNOSIS — R109 Unspecified abdominal pain: Secondary | ICD-10-CM | POA: Insufficient documentation

## 2015-05-08 DIAGNOSIS — K5909 Other constipation: Secondary | ICD-10-CM | POA: Diagnosis not present

## 2015-05-08 MED ORDER — PANTOPRAZOLE SODIUM 40 MG PO TBEC: 40.0000 mg | DELAYED_RELEASE_TABLET | Freq: Every day | ORAL | Status: DC

## 2015-05-08 MED ORDER — LUBIPROSTONE 24 MCG PO CAPS: 24.0000 ug | ORAL_CAPSULE | Freq: Two times a day (BID) | ORAL | Status: DC

## 2015-05-08 NOTE — Progress Notes (Signed)
Referring Provider: [name], MD Primary Care Physician:  [name], MD  Primary GI: Dr. [name]   Chief Complaint  Patient presents with  . Abdominal Pain    HPI:   [name] is a 80 y.o. female presenting today with a history of constipation, cervical esophageal web s/p dilation Nov 2015, colonoscopy up to date as of Nov 2015, referred again by Dr. Legrand Rams due to abdominal pain.   Weight down about 10 lbs since last year. CT without contrast Dec 2016 without acute findings in abdomen/pelvis, ventral hernia containing fat and unobstructed small bowel. Oct 2016 CT with contrast noting atherosclerosis of abdominal aorta without aneurysm. No acute abnormality.   States she has upper abdominal pain and a lower abdominal pain. Daughter present, [name]. Pain present since at least October 2016. Always feels underlying, waxes and wanes in intensity. Unsure if any relation to eating. BM daily. States feels like sometimes there is some stool left, unproductive. Sometimes pain better after BM, sometimes not. Was given Amitiza 8 mcg samples at last visit. Small little balls at time. Had only one episode of vomiting. Occasional nausea. Appetite has been good. Unintentional weight loss noted. Occasional chills. Has chronic UTIs.   Past Medical History  Diagnosis Date  . Diabetes mellitus without complication ([place])   . Hypertension   . GERD (gastroesophageal reflux disease)   . Arthritis   . CVA (cerebral infarction)   . Stroke [facility])     Past Surgical History  Procedure Laterality Date  . Cholecystectomy    . Back surgery    . Joint replacement      Rt hip, ????? sounds like just for fracture  . Cataract extraction w/phaco Left 02/28/2013    Procedure: CATARACT EXTRACTION PHACO AND INTRAOCULAR LENS PLACEMENT (IOL) CDE=15.60;  Surgeon: Elta Guadeloupe T. Gershon Crane, MD;  Location: [facility] OR[facility];  Service: Ophthalmology;  Laterality: Left;  . Colonoscopy  12/12/2003    RMR: Normal rectum.  Long  capacious tortuous colon, colonic mucosa appeared normal  . Esophagogastroduodenoscopy  12/28/2001    [id number] sliding hiatal hernia/. Three small bulbar ulcers, two with stigmata of bleeding and these were coagulated using heater probe unit   . Hip fracture surgery  2008  . Colonoscopy N/A 03/21/2014    Dr. [name]: internal and external hemorrhoids, torturous colon, colonic diverticulosis   . Esophagogastroduodenoscopy N/A 03/21/2014    Dr. [name]: cervical esophageal web s/p dilation, negative H.pylori   . Savory dilation N/A 03/21/2014    Procedure: SAVORY DILATION;  Surgeon: [name], MD;  Location: [facility];  Service: Endoscopy;  Laterality: N/A;  [name] dilation N/A 03/21/2014    Procedure: [name] DILATION;  Surgeon: [name], MD;  Location: [facility];  Service: Endoscopy;  Laterality: N/A;  . Eye surgery      Current Outpatient Prescriptions  Medication Sig Dispense Refill  . cephALEXin (KEFLEX) 500 MG capsule Take 1 capsule (500 mg total) by mouth 3 (three) times daily. 40 capsule 0  . furosemide (LASIX) 20 MG tablet Take 20 mg by mouth daily.    Marland Kitchen gabapentin (NEURONTIN) 300 MG capsule Take 300 mg by mouth 2 (two) times daily.     . insulin detemir (LEVEMIR) 100 UNIT/ML injection Inject 80 Units into the skin at bedtime.     . insulin lispro (HUMALOG) 100 UNIT/ML injection Inject 20 Units into the skin 3 (three) times daily before meals.    . pantoprazole (PROTONIX) 20 MG tablet Take  1 tablet (20 mg total) by mouth daily. 30 tablet 0  . simvastatin (ZOCOR) 20 MG tablet Take 20 mg by mouth daily.    . traMADol (ULTRAM) 50 MG tablet Take 50 mg by mouth 2 (two) times daily.    . valsartan (DIOVAN) 320 MG tablet Take 1 tablet by mouth daily.     No current facility-administered medications for this visit.    Allergies as of 05/08/2015  . (No Known Allergies)    Family History  Problem Relation Age of Onset  . Crohn's disease Daughter   . Colon cancer Neg  Hx     Social History   Social History  . Marital Status: Widowed    Spouse Name: N/A  . Number of Children: 8  . Years of Education: N/A   Social History Main Topics  . Smoking status: Never Smoker   . Smokeless tobacco: None  . Alcohol Use: No  . Drug Use: No  . Sexual Activity: Not Asked   Other Topics Concern  . None   Social History Narrative    Review of Systems: Gen: see HPI  CV: Denies chest pain, palpitations, syncope, peripheral edema, and claudication. Resp: Denies dyspnea at rest, cough, wheezing, coughing up blood, and pleurisy. GI: see HPI  Derm: Denies rash, itching, dry skin Psych: Denies depression, anxiety, memory loss, confusion. No homicidal or suicidal ideation.  Heme: Denies bruising, bleeding, and enlarged lymph nodes.  Physical Exam: Temp(Src) 98 F (36.7 C)  Ht 5\' 5"  (1.651 m)  Wt 199 lb 9.6 oz (90.538 kg)  BMI 33.22 kg/m2 General:   Alert and oriented. No distress noted. Pleasant and cooperative.  Head:  Normocephalic and atraumatic. Eyes:  Conjuctiva clear without scleral icterus. Mouth:  Oral mucosa pink and moist. Good dentition. No lesions. Heart:  S1, S2 present without murmurs, rubs, or gallops. Regular rate and rhythm. Abdomen:  +BS, soft, non-tender and non-distended. ventral hernia Msk:  Symmetrical without gross deformities. Normal posture. Extremities:  Without edema. Neurologic:  Alert and  oriented x4;  grossly normal neurologically. Psych:  Alert and cooperative. Normal mood and affect.  Lab Results  Component Value Date   WBC 9.0 05/02/2015   HGB 12.2 05/02/2015   HCT 38.0 05/02/2015   MCV 91.3 05/02/2015   PLT 278 05/02/2015   Lab Results  Component Value Date   ALT 16 05/02/2015   AST 20 05/02/2015   ALKPHOS 61 05/02/2015   BILITOT 0.3 05/02/2015   Lab Results  Component Value Date   CREATININE 1.91* 05/02/2015   BUN 32* 05/02/2015   NA 136 05/02/2015   K 4.3 05/02/2015   CL 105 05/02/2015   CO2 23  05/02/2015   Lab Results  Component Value Date   LIPASE 26 04/20/2015

## 2015-05-08 NOTE — Patient Instructions (Signed)
Start taking the samples of Amitiza 1 gelcap twice a day WITH FOOD to avoid nausea. This is to help you have more productive bowel movements. I also sent this to your pharmacy if it works well.  I have changed the Protonix dose. I sent in a new prescription for Protonix 40 mg each morning, 30 minutes before breakfast.  Call me in 2 weeks if things are not better. I will see you in 4 weeks.  I would like you to see Dr. Legrand Rams in the next week or so as your creatinine (which tells how your kidneys are doing) is slowly increasing.

## 2015-05-09 DIAGNOSIS — N182 Chronic kidney disease, stage 2 (mild): Secondary | ICD-10-CM | POA: Diagnosis not present

## 2015-05-09 DIAGNOSIS — I10 Essential (primary) hypertension: Secondary | ICD-10-CM | POA: Diagnosis not present

## 2015-05-09 DIAGNOSIS — M549 Dorsalgia, unspecified: Secondary | ICD-10-CM | POA: Diagnosis not present

## 2015-05-09 DIAGNOSIS — R300 Dysuria: Secondary | ICD-10-CM | POA: Diagnosis not present

## 2015-05-09 DIAGNOSIS — I6350 Cerebral infarction due to unspecified occlusion or stenosis of unspecified cerebral artery: Secondary | ICD-10-CM | POA: Diagnosis not present

## 2015-05-09 DIAGNOSIS — G8190 Hemiplegia, unspecified affecting unspecified side: Secondary | ICD-10-CM | POA: Diagnosis not present

## 2015-05-09 DIAGNOSIS — H1045 Other chronic allergic conjunctivitis: Secondary | ICD-10-CM | POA: Diagnosis not present

## 2015-05-09 DIAGNOSIS — E1142 Type 2 diabetes mellitus with diabetic polyneuropathy: Secondary | ICD-10-CM | POA: Diagnosis not present

## 2015-05-16 ENCOUNTER — Ambulatory Visit: Payer: Self-pay | Admitting: "Endocrinology

## 2015-05-17 NOTE — Assessment & Plan Note (Signed)
80 year old female with vague reports of upper and lower abdominal discomfort for several months now, always underlying but waxing and waning in intensity. Appears to have some improvement after defecation at times. Underlying constipation could definitely be playing a role, but weight loss over the past year is concerning. CT on file X 2.   Start Amitiza 24 mcg po BID Start Protonix 40 mg once daily Return in 4 weeks for close follow-up Colonoscopy/EGD up-to-date May need CTA

## 2015-05-21 NOTE — Progress Notes (Signed)
cc'ed to pcp °

## 2015-06-11 ENCOUNTER — Encounter: Payer: Self-pay | Admitting: Gastroenterology

## 2015-06-11 ENCOUNTER — Ambulatory Visit (INDEPENDENT_AMBULATORY_CARE_PROVIDER_SITE_OTHER): Payer: Commercial Managed Care - HMO | Admitting: Gastroenterology

## 2015-06-11 VITALS — BP 140/81 | HR 107 | Temp 97.0°F | Ht 65.0 in | Wt 217.6 lb

## 2015-06-11 DIAGNOSIS — K5909 Other constipation: Secondary | ICD-10-CM | POA: Diagnosis not present

## 2015-06-11 MED ORDER — HYDROCORTISONE 2.5 % RE CREA: 1.0000 "application " | TOPICAL_CREAM | Freq: Two times a day (BID) | RECTAL | Status: DC

## 2015-06-11 NOTE — Progress Notes (Signed)
Referring Provider: [name], MD Primary Care Physician:  [name], MD  Primary GI: Dr. [name]   Chief Complaint  Patient presents with  . Follow-up    HPI:   [name] is a 81 y.o. female presenting today with a history of constipation, cervical esophageal web s/p dilation Nov 2015, colonoscopy up to date as of Nov 2015. Seen Dec 2016 and started on Amitiza 24 mcg BID and Protonix 40 mg daily. Here for close follow-up, as there was concern for weight loss.   Amitiza 24 mcg po BID. Improved BMs. Some low volume hematochezia. Some burning/itching. Admits to straining sometimes. Feels like sitting on it sometimes. Abdominal pain resolved. Gained weight since last appt. No N/V.    Past Medical History  Diagnosis Date  . Diabetes mellitus without complication ([place])   . Hypertension   . GERD (gastroesophageal reflux disease)   . Arthritis   . CVA (cerebral infarction)   . Stroke [facility])     Past Surgical History  Procedure Laterality Date  . Cholecystectomy    . Back surgery    . Joint replacement      Rt hip, ????? sounds like just for fracture  . Cataract extraction w/phaco Left 02/28/2013    Procedure: CATARACT EXTRACTION PHACO AND INTRAOCULAR LENS PLACEMENT (IOL) CDE=15.60;  Surgeon: Elta Guadeloupe T. Gershon Crane, MD;  Location: [facility];  Service: Ophthalmology;  Laterality: Left;  . Colonoscopy  12/12/2003    RMR: Normal rectum.  Long capacious tortuous colon, colonic mucosa appeared normal  . Esophagogastroduodenoscopy  12/28/2001    [id number] sliding hiatal hernia/. Three small bulbar ulcers, two with stigmata of bleeding and these were coagulated using heater probe unit   . Hip fracture surgery  2008  . Colonoscopy N/A 03/21/2014    Dr. [name]: internal and external hemorrhoids, torturous colon, colonic diverticulosis   . Esophagogastroduodenoscopy N/A 03/21/2014    Dr. [name]: cervical esophageal web s/p dilation, negative H.pylori   . Savory dilation N/A 03/21/2014   Procedure: SAVORY DILATION;  Surgeon: [name], MD;  [facility];  Service: Endoscopy;  Laterality: N/A;  [name] dilation N/A 03/21/2014    Procedure: [name] DILATION;  Surgeon: [name], MD;  [facility];  Service: Endoscopy;  Laterality: N/A;  . Eye surgery      Current Outpatient Prescriptions  Medication Sig Dispense Refill  . furosemide (LASIX) 20 MG tablet Take 20 mg by mouth daily.    Marland Kitchen gabapentin (NEURONTIN) 300 MG capsule Take 300 mg by mouth 2 (two) times daily.     . insulin detemir (LEVEMIR) 100 UNIT/ML injection Inject 80 Units into the skin at bedtime.     . insulin lispro (HUMALOG) 100 UNIT/ML injection Inject 20 Units into the skin 3 (three) times daily before meals.    . lubiprostone (AMITIZA) 24 MCG capsule Take 1 capsule (24 mcg total) by mouth 2 (two) times daily with a meal. 60 capsule 3  . pantoprazole (PROTONIX) 40 MG tablet Take 1 tablet (40 mg total) by mouth daily. 90 tablet 3  . simvastatin (ZOCOR) 20 MG tablet Take 20 mg by mouth daily.    . traMADol (ULTRAM) 50 MG tablet Take 50 mg by mouth 2 (two) times daily.    . valsartan (DIOVAN) 320 MG tablet Take 1 tablet by mouth daily.    . cephALEXin (KEFLEX) 500 MG capsule Take 1 capsule (500 mg total) by mouth 3 (three) times daily. (Patient not taking:  Reported on 06/11/2015) 40 capsule 0   No current facility-administered medications for this visit.    Allergies as of 06/11/2015  . (No Known Allergies)    Family History  Problem Relation Age of Onset  . Crohn's disease Daughter   . Colon cancer Neg Hx     Social History   Social History  . Marital Status: Widowed    Spouse Name: N/A  . Number of Children: 8  . Years of Education: N/A   Social History Main Topics  . Smoking status: Never Smoker   . Smokeless tobacco: None  . Alcohol Use: No  . Drug Use: No  . Sexual Activity: Not Asked   Other Topics Concern  . None   Social History Narrative    Review  of Systems: As mentioned in HPI   Physical Exam: BP 140/81 mmHg  Pulse 107  Temp(Src) 97 F (36.1 C) (Oral)  Ht 5\' 5"  (1.651 m)  Wt 217 lb 9.6 oz (98.703 kg)  BMI 36.21 kg/m2 General:   Alert and oriented. No distress noted. Pleasant and cooperative.  Head:  Normocephalic and atraumatic. Eyes:  Conjuctiva clear without scleral icterus. Abdomen:  +BS, soft, non-tender and non-distended. No rebound or guarding. No HSM or masses noted. Rectal: declining  Msk:  Symmetrical without gross deformities. Normal posture. Extremities:  Without edema. Neurologic:  Alert and  oriented x4;  grossly normal neurologically. Psych:  Alert and cooperative. Normal mood and affect.

## 2015-06-11 NOTE — Patient Instructions (Signed)
Continue Amitiza 24 mcg twice a day with food.   You may use Anusol cream twice a day for 5-7 days at a time. Let me know if you would like to do the hemorrhoid banding. Avoid straining!  We will see you in 6 months!

## 2015-06-13 DIAGNOSIS — E1165 Type 2 diabetes mellitus with hyperglycemia: Secondary | ICD-10-CM | POA: Diagnosis not present

## 2015-06-13 DIAGNOSIS — G8190 Hemiplegia, unspecified affecting unspecified side: Secondary | ICD-10-CM | POA: Diagnosis not present

## 2015-06-13 DIAGNOSIS — E1142 Type 2 diabetes mellitus with diabetic polyneuropathy: Secondary | ICD-10-CM | POA: Diagnosis not present

## 2015-06-18 NOTE — Assessment & Plan Note (Signed)
81 year old female with improvement on Amitiza 24 mcg BID. Abdominal pain resolved. She has actually gained weight since last appt. No concerning GI symptoms. Low-dose hematochezia likely benign anorectal source in setting of straining. Declined rectal exam today. Colonoscopy up-to-date as of Nov 2015. Declining hemorrhoid banding although this was discussed and described in detail today. Will provide Anusol, continue Amitiza, and return in 6 months or sooner if needed.

## 2015-06-19 NOTE — Progress Notes (Signed)
CC'D TO PCP °

## 2015-09-12 DIAGNOSIS — R300 Dysuria: Secondary | ICD-10-CM | POA: Diagnosis not present

## 2015-09-12 DIAGNOSIS — E1142 Type 2 diabetes mellitus with diabetic polyneuropathy: Secondary | ICD-10-CM | POA: Diagnosis not present

## 2015-09-12 DIAGNOSIS — G8190 Hemiplegia, unspecified affecting unspecified side: Secondary | ICD-10-CM | POA: Diagnosis not present

## 2015-09-12 DIAGNOSIS — I10 Essential (primary) hypertension: Secondary | ICD-10-CM | POA: Diagnosis not present

## 2015-09-12 DIAGNOSIS — Z6835 Body mass index (BMI) 35.0-35.9, adult: Secondary | ICD-10-CM | POA: Diagnosis not present

## 2015-09-12 DIAGNOSIS — H1045 Other chronic allergic conjunctivitis: Secondary | ICD-10-CM | POA: Diagnosis not present

## 2015-09-12 DIAGNOSIS — I6350 Cerebral infarction due to unspecified occlusion or stenosis of unspecified cerebral artery: Secondary | ICD-10-CM | POA: Diagnosis not present

## 2015-09-12 DIAGNOSIS — M549 Dorsalgia, unspecified: Secondary | ICD-10-CM | POA: Diagnosis not present

## 2015-12-10 ENCOUNTER — Encounter: Payer: Self-pay | Admitting: Gastroenterology

## 2015-12-10 ENCOUNTER — Ambulatory Visit (INDEPENDENT_AMBULATORY_CARE_PROVIDER_SITE_OTHER): Payer: Commercial Managed Care - HMO | Admitting: Gastroenterology

## 2015-12-10 VITALS — BP 188/81 | HR 78 | Temp 98.0°F | Ht 66.0 in | Wt 221.4 lb

## 2015-12-10 DIAGNOSIS — K5909 Other constipation: Secondary | ICD-10-CM

## 2015-12-10 DIAGNOSIS — K219 Gastro-esophageal reflux disease without esophagitis: Secondary | ICD-10-CM | POA: Diagnosis not present

## 2015-12-10 NOTE — Progress Notes (Signed)
Referring Provider: [name], MD Primary Care Physician:  [name], MD  Primary GI: Dr. [name]   Chief Complaint  Patient presents with  . Follow-up    doing well    HPI:   [name] is a 81 y.o. female presenting today with a history of constipation, cervical esophageal web s/p dilation Nov 2015. Here for routine visit.    Will just take Amitiza 24 mcg just once as needed. Sometimes taking just once a week as needed, but "works for me". No abdominal pain. Rare scant tissue paper hematochezia noted in setting of known hemorrhoids. Declining hemorrhoid banding in the past. Occasionally uses Anusol cream. Last colonoscopy in 2015. Up-to-date on colonoscopy. No FH of colon cancer. No problems with upper GI symptoms. Has gained weight. Unable to exercise. Taking insulin shots. No upper GI symptoms.   Past Medical History:  Diagnosis Date  . Arthritis   . CVA (cerebral infarction)   . Diabetes mellitus without complication ([place])   . GERD (gastroesophageal reflux disease)   . Hypertension   . Stroke [facility])     Past Surgical History:  Procedure Laterality Date  . BACK SURGERY    . CATARACT EXTRACTION W/PHACO Left 02/28/2013   Procedure: CATARACT EXTRACTION PHACO AND INTRAOCULAR LENS PLACEMENT (IOL) CDE=15.60;  Surgeon: Elta Guadeloupe T. Gershon Crane, MD;  Location: [facility];  Service: Ophthalmology;  Laterality: Left;  . CHOLECYSTECTOMY    . COLONOSCOPY  12/12/2003   RMR: Normal rectum.  Long capacious tortuous colon, colonic mucosa appeared normal  . COLONOSCOPY N/A 03/21/2014   Dr. [name]: internal and external hemorrhoids, torturous colon, colonic diverticulosis   . ESOPHAGOGASTRODUODENOSCOPY  12/28/2001   [id number] sliding hiatal hernia/. Three small bulbar ulcers, two with stigmata of bleeding and these were coagulated using heater probe unit   . ESOPHAGOGASTRODUODENOSCOPY N/A 03/21/2014   Dr. [name]: cervical esophageal web s/p dilation, negative H.pylori   . EYE SURGERY    . HIP  FRACTURE SURGERY  2008  . JOINT REPLACEMENT     Rt hip, ????? sounds like just for fracture  . MALONEY DILATION N/A 03/21/2014   Procedure: [name] DILATION;  Surgeon: [name], MD;  [facility];  Service: Endoscopy;  Laterality: N/A;  . SAVORY DILATION N/A 03/21/2014   Procedure: SAVORY DILATION;  Surgeon: [name], MD;  [facility]: [facility];  Service: Endoscopy;  Laterality: N/A;    Current Outpatient Prescriptions  Medication Sig Dispense Refill  . amLODipine (NORVASC) 10 MG tablet Take 10 mg by mouth daily.    . furosemide (LASIX) 20 MG tablet Take 20 mg by mouth daily. PT said she has been out of the Lasix x 2 weeks    . gabapentin (NEURONTIN) 300 MG capsule Take 300 mg by mouth 2 (two) times daily. PT said she only takes once daily    . hydrocortisone (ANUSOL-HC) 2.5 % rectal cream Place 1 application rectally 2 (two) times daily. 30 g 1  . insulin detemir (LEVEMIR) 100 UNIT/ML injection Inject 80 Units into the skin at bedtime.     . insulin lispro (HUMALOG) 100 UNIT/ML injection Inject 20 Units into the skin 3 (three) times daily before meals.    . lubiprostone (AMITIZA) 24 MCG capsule Take 1 capsule (24 mcg total) by mouth 2 (two) times daily with a meal. (Patient taking differently: Take 24 mcg by mouth 2 (two) times daily with a meal. Takes as needed) 60 capsule 3  . pantoprazole (PROTONIX) 40 MG  tablet Take 1 tablet (40 mg total) by mouth daily. 90 tablet 3  . simvastatin (ZOCOR) 20 MG tablet Take 20 mg by mouth daily.    . valsartan (DIOVAN) 320 MG tablet Take 1 tablet by mouth daily.     No current facility-administered medications for this visit.     Allergies as of 12/10/2015  . (No Known Allergies)    Family History  Problem Relation Age of Onset  . Crohn's disease Daughter   . Colon cancer Neg Hx     Social History   Social History  . Marital status: Widowed    Spouse name: N/A  . Number of children: 8  . Years of education: N/A    Social History Main Topics  . Smoking status: Never Smoker  . Smokeless tobacco: None  . Alcohol use No  . Drug use: No  . Sexual activity: Not Asked   Other Topics Concern  . None   Social History Narrative  . None    Review of Systems: Negative unless mentioned in HPI.   Physical Exam: BP (!) 188/81   Pulse 78   Temp 98 F (36.7 C) (Oral)   Ht 5\' 6"  (1.676 m)   Wt 221 lb 6.4 oz (100.4 kg)   BMI 35.73 kg/m  General:   Alert and oriented. No distress noted. Pleasant and cooperative.  Head:  Normocephalic and atraumatic. Eyes:  Conjuctiva clear without scleral icterus. Abdomen:  +BS, soft, non-tender and non-distended. No rebound or guarding.  Msk:  Symmetrical without gross deformities.  Extremities:  2+ pitting lower extremity edema.  Psych:  Alert and cooperative. Normal mood and affect.

## 2015-12-10 NOTE — Patient Instructions (Signed)
Continue Amitiza as needed.   Continue Protonix once a day.   We will see you in 1 year.

## 2015-12-12 DIAGNOSIS — R609 Edema, unspecified: Secondary | ICD-10-CM | POA: Diagnosis not present

## 2015-12-12 DIAGNOSIS — E1165 Type 2 diabetes mellitus with hyperglycemia: Secondary | ICD-10-CM | POA: Diagnosis not present

## 2015-12-12 DIAGNOSIS — I10 Essential (primary) hypertension: Secondary | ICD-10-CM | POA: Diagnosis not present

## 2015-12-12 NOTE — Assessment & Plan Note (Signed)
Continue Protonix. Return in 1 year.  

## 2015-12-12 NOTE — Progress Notes (Signed)
CC'ED TO PCP 

## 2015-12-12 NOTE — Assessment & Plan Note (Signed)
Doing well with Amitiza 24 mcg, taking in a novel approach just as needed. Although this is not how it is intended, it is working well for her. Will have her return in one year.

## 2015-12-14 DIAGNOSIS — M6281 Muscle weakness (generalized): Secondary | ICD-10-CM | POA: Diagnosis not present

## 2015-12-14 DIAGNOSIS — L97821 Non-pressure chronic ulcer of other part of left lower leg limited to breakdown of skin: Secondary | ICD-10-CM | POA: Diagnosis not present

## 2015-12-14 DIAGNOSIS — E1142 Type 2 diabetes mellitus with diabetic polyneuropathy: Secondary | ICD-10-CM | POA: Diagnosis not present

## 2015-12-14 DIAGNOSIS — L97811 Non-pressure chronic ulcer of other part of right lower leg limited to breakdown of skin: Secondary | ICD-10-CM | POA: Diagnosis not present

## 2015-12-14 DIAGNOSIS — I872 Venous insufficiency (chronic) (peripheral): Secondary | ICD-10-CM | POA: Diagnosis not present

## 2015-12-16 DIAGNOSIS — M6281 Muscle weakness (generalized): Secondary | ICD-10-CM | POA: Diagnosis not present

## 2015-12-16 DIAGNOSIS — L97811 Non-pressure chronic ulcer of other part of right lower leg limited to breakdown of skin: Secondary | ICD-10-CM | POA: Diagnosis not present

## 2015-12-16 DIAGNOSIS — I872 Venous insufficiency (chronic) (peripheral): Secondary | ICD-10-CM | POA: Diagnosis not present

## 2015-12-16 DIAGNOSIS — E1142 Type 2 diabetes mellitus with diabetic polyneuropathy: Secondary | ICD-10-CM | POA: Diagnosis not present

## 2015-12-16 DIAGNOSIS — L97821 Non-pressure chronic ulcer of other part of left lower leg limited to breakdown of skin: Secondary | ICD-10-CM | POA: Diagnosis not present

## 2015-12-18 DIAGNOSIS — L97821 Non-pressure chronic ulcer of other part of left lower leg limited to breakdown of skin: Secondary | ICD-10-CM | POA: Diagnosis not present

## 2015-12-18 DIAGNOSIS — E1142 Type 2 diabetes mellitus with diabetic polyneuropathy: Secondary | ICD-10-CM | POA: Diagnosis not present

## 2015-12-18 DIAGNOSIS — M6281 Muscle weakness (generalized): Secondary | ICD-10-CM | POA: Diagnosis not present

## 2015-12-18 DIAGNOSIS — I872 Venous insufficiency (chronic) (peripheral): Secondary | ICD-10-CM | POA: Diagnosis not present

## 2015-12-18 DIAGNOSIS — L97811 Non-pressure chronic ulcer of other part of right lower leg limited to breakdown of skin: Secondary | ICD-10-CM | POA: Diagnosis not present

## 2015-12-19 DIAGNOSIS — L97821 Non-pressure chronic ulcer of other part of left lower leg limited to breakdown of skin: Secondary | ICD-10-CM | POA: Diagnosis not present

## 2015-12-19 DIAGNOSIS — E1142 Type 2 diabetes mellitus with diabetic polyneuropathy: Secondary | ICD-10-CM | POA: Diagnosis not present

## 2015-12-19 DIAGNOSIS — I872 Venous insufficiency (chronic) (peripheral): Secondary | ICD-10-CM | POA: Diagnosis not present

## 2015-12-19 DIAGNOSIS — L97811 Non-pressure chronic ulcer of other part of right lower leg limited to breakdown of skin: Secondary | ICD-10-CM | POA: Diagnosis not present

## 2015-12-19 DIAGNOSIS — M6281 Muscle weakness (generalized): Secondary | ICD-10-CM | POA: Diagnosis not present

## 2015-12-21 DIAGNOSIS — L97821 Non-pressure chronic ulcer of other part of left lower leg limited to breakdown of skin: Secondary | ICD-10-CM | POA: Diagnosis not present

## 2015-12-21 DIAGNOSIS — L97811 Non-pressure chronic ulcer of other part of right lower leg limited to breakdown of skin: Secondary | ICD-10-CM | POA: Diagnosis not present

## 2015-12-21 DIAGNOSIS — I872 Venous insufficiency (chronic) (peripheral): Secondary | ICD-10-CM | POA: Diagnosis not present

## 2015-12-21 DIAGNOSIS — M6281 Muscle weakness (generalized): Secondary | ICD-10-CM | POA: Diagnosis not present

## 2015-12-21 DIAGNOSIS — E1142 Type 2 diabetes mellitus with diabetic polyneuropathy: Secondary | ICD-10-CM | POA: Diagnosis not present

## 2015-12-23 DIAGNOSIS — L97821 Non-pressure chronic ulcer of other part of left lower leg limited to breakdown of skin: Secondary | ICD-10-CM | POA: Diagnosis not present

## 2015-12-23 DIAGNOSIS — I872 Venous insufficiency (chronic) (peripheral): Secondary | ICD-10-CM | POA: Diagnosis not present

## 2015-12-23 DIAGNOSIS — L97811 Non-pressure chronic ulcer of other part of right lower leg limited to breakdown of skin: Secondary | ICD-10-CM | POA: Diagnosis not present

## 2015-12-23 DIAGNOSIS — M6281 Muscle weakness (generalized): Secondary | ICD-10-CM | POA: Diagnosis not present

## 2015-12-23 DIAGNOSIS — E1142 Type 2 diabetes mellitus with diabetic polyneuropathy: Secondary | ICD-10-CM | POA: Diagnosis not present

## 2015-12-24 DIAGNOSIS — I872 Venous insufficiency (chronic) (peripheral): Secondary | ICD-10-CM | POA: Diagnosis not present

## 2015-12-24 DIAGNOSIS — L97811 Non-pressure chronic ulcer of other part of right lower leg limited to breakdown of skin: Secondary | ICD-10-CM | POA: Diagnosis not present

## 2015-12-24 DIAGNOSIS — E1142 Type 2 diabetes mellitus with diabetic polyneuropathy: Secondary | ICD-10-CM | POA: Diagnosis not present

## 2015-12-24 DIAGNOSIS — M6281 Muscle weakness (generalized): Secondary | ICD-10-CM | POA: Diagnosis not present

## 2015-12-24 DIAGNOSIS — L97821 Non-pressure chronic ulcer of other part of left lower leg limited to breakdown of skin: Secondary | ICD-10-CM | POA: Diagnosis not present

## 2015-12-26 DIAGNOSIS — E1142 Type 2 diabetes mellitus with diabetic polyneuropathy: Secondary | ICD-10-CM | POA: Diagnosis not present

## 2015-12-26 DIAGNOSIS — L97811 Non-pressure chronic ulcer of other part of right lower leg limited to breakdown of skin: Secondary | ICD-10-CM | POA: Diagnosis not present

## 2015-12-26 DIAGNOSIS — I872 Venous insufficiency (chronic) (peripheral): Secondary | ICD-10-CM | POA: Diagnosis not present

## 2015-12-26 DIAGNOSIS — M6281 Muscle weakness (generalized): Secondary | ICD-10-CM | POA: Diagnosis not present

## 2015-12-26 DIAGNOSIS — L97821 Non-pressure chronic ulcer of other part of left lower leg limited to breakdown of skin: Secondary | ICD-10-CM | POA: Diagnosis not present

## 2015-12-30 DIAGNOSIS — E1142 Type 2 diabetes mellitus with diabetic polyneuropathy: Secondary | ICD-10-CM | POA: Diagnosis not present

## 2015-12-30 DIAGNOSIS — M6281 Muscle weakness (generalized): Secondary | ICD-10-CM | POA: Diagnosis not present

## 2015-12-30 DIAGNOSIS — L97811 Non-pressure chronic ulcer of other part of right lower leg limited to breakdown of skin: Secondary | ICD-10-CM | POA: Diagnosis not present

## 2015-12-30 DIAGNOSIS — I872 Venous insufficiency (chronic) (peripheral): Secondary | ICD-10-CM | POA: Diagnosis not present

## 2015-12-30 DIAGNOSIS — L97821 Non-pressure chronic ulcer of other part of left lower leg limited to breakdown of skin: Secondary | ICD-10-CM | POA: Diagnosis not present

## 2016-01-01 DIAGNOSIS — M6281 Muscle weakness (generalized): Secondary | ICD-10-CM | POA: Diagnosis not present

## 2016-01-01 DIAGNOSIS — L97821 Non-pressure chronic ulcer of other part of left lower leg limited to breakdown of skin: Secondary | ICD-10-CM | POA: Diagnosis not present

## 2016-01-01 DIAGNOSIS — I872 Venous insufficiency (chronic) (peripheral): Secondary | ICD-10-CM | POA: Diagnosis not present

## 2016-01-01 DIAGNOSIS — E1142 Type 2 diabetes mellitus with diabetic polyneuropathy: Secondary | ICD-10-CM | POA: Diagnosis not present

## 2016-01-01 DIAGNOSIS — L97811 Non-pressure chronic ulcer of other part of right lower leg limited to breakdown of skin: Secondary | ICD-10-CM | POA: Diagnosis not present

## 2016-01-02 DIAGNOSIS — M6281 Muscle weakness (generalized): Secondary | ICD-10-CM | POA: Diagnosis not present

## 2016-01-02 DIAGNOSIS — L97821 Non-pressure chronic ulcer of other part of left lower leg limited to breakdown of skin: Secondary | ICD-10-CM | POA: Diagnosis not present

## 2016-01-02 DIAGNOSIS — I872 Venous insufficiency (chronic) (peripheral): Secondary | ICD-10-CM | POA: Diagnosis not present

## 2016-01-02 DIAGNOSIS — E1142 Type 2 diabetes mellitus with diabetic polyneuropathy: Secondary | ICD-10-CM | POA: Diagnosis not present

## 2016-01-02 DIAGNOSIS — L97811 Non-pressure chronic ulcer of other part of right lower leg limited to breakdown of skin: Secondary | ICD-10-CM | POA: Diagnosis not present

## 2016-01-03 DIAGNOSIS — I872 Venous insufficiency (chronic) (peripheral): Secondary | ICD-10-CM | POA: Diagnosis not present

## 2016-01-03 DIAGNOSIS — M6281 Muscle weakness (generalized): Secondary | ICD-10-CM | POA: Diagnosis not present

## 2016-01-03 DIAGNOSIS — L97811 Non-pressure chronic ulcer of other part of right lower leg limited to breakdown of skin: Secondary | ICD-10-CM | POA: Diagnosis not present

## 2016-01-03 DIAGNOSIS — E1142 Type 2 diabetes mellitus with diabetic polyneuropathy: Secondary | ICD-10-CM | POA: Diagnosis not present

## 2016-01-03 DIAGNOSIS — L97821 Non-pressure chronic ulcer of other part of left lower leg limited to breakdown of skin: Secondary | ICD-10-CM | POA: Diagnosis not present

## 2016-01-08 DIAGNOSIS — L97821 Non-pressure chronic ulcer of other part of left lower leg limited to breakdown of skin: Secondary | ICD-10-CM | POA: Diagnosis not present

## 2016-01-08 DIAGNOSIS — E1142 Type 2 diabetes mellitus with diabetic polyneuropathy: Secondary | ICD-10-CM | POA: Diagnosis not present

## 2016-01-08 DIAGNOSIS — L97811 Non-pressure chronic ulcer of other part of right lower leg limited to breakdown of skin: Secondary | ICD-10-CM | POA: Diagnosis not present

## 2016-01-08 DIAGNOSIS — M6281 Muscle weakness (generalized): Secondary | ICD-10-CM | POA: Diagnosis not present

## 2016-01-08 DIAGNOSIS — I872 Venous insufficiency (chronic) (peripheral): Secondary | ICD-10-CM | POA: Diagnosis not present

## 2016-01-09 DIAGNOSIS — Z0000 Encounter for general adult medical examination without abnormal findings: Secondary | ICD-10-CM | POA: Diagnosis not present

## 2016-01-09 DIAGNOSIS — M549 Dorsalgia, unspecified: Secondary | ICD-10-CM | POA: Diagnosis not present

## 2016-01-09 DIAGNOSIS — H1045 Other chronic allergic conjunctivitis: Secondary | ICD-10-CM | POA: Diagnosis not present

## 2016-01-09 DIAGNOSIS — I6350 Cerebral infarction due to unspecified occlusion or stenosis of unspecified cerebral artery: Secondary | ICD-10-CM | POA: Diagnosis not present

## 2016-01-09 DIAGNOSIS — E1142 Type 2 diabetes mellitus with diabetic polyneuropathy: Secondary | ICD-10-CM | POA: Diagnosis not present

## 2016-01-09 DIAGNOSIS — R300 Dysuria: Secondary | ICD-10-CM | POA: Diagnosis not present

## 2016-01-09 DIAGNOSIS — I10 Essential (primary) hypertension: Secondary | ICD-10-CM | POA: Diagnosis not present

## 2016-01-09 DIAGNOSIS — G8190 Hemiplegia, unspecified affecting unspecified side: Secondary | ICD-10-CM | POA: Diagnosis not present

## 2016-01-10 DIAGNOSIS — M6281 Muscle weakness (generalized): Secondary | ICD-10-CM | POA: Diagnosis not present

## 2016-01-10 DIAGNOSIS — L97821 Non-pressure chronic ulcer of other part of left lower leg limited to breakdown of skin: Secondary | ICD-10-CM | POA: Diagnosis not present

## 2016-01-10 DIAGNOSIS — I872 Venous insufficiency (chronic) (peripheral): Secondary | ICD-10-CM | POA: Diagnosis not present

## 2016-01-10 DIAGNOSIS — E1142 Type 2 diabetes mellitus with diabetic polyneuropathy: Secondary | ICD-10-CM | POA: Diagnosis not present

## 2016-01-10 DIAGNOSIS — L97811 Non-pressure chronic ulcer of other part of right lower leg limited to breakdown of skin: Secondary | ICD-10-CM | POA: Diagnosis not present

## 2016-01-13 DIAGNOSIS — L97821 Non-pressure chronic ulcer of other part of left lower leg limited to breakdown of skin: Secondary | ICD-10-CM | POA: Diagnosis not present

## 2016-01-13 DIAGNOSIS — M6281 Muscle weakness (generalized): Secondary | ICD-10-CM | POA: Diagnosis not present

## 2016-01-13 DIAGNOSIS — E1142 Type 2 diabetes mellitus with diabetic polyneuropathy: Secondary | ICD-10-CM | POA: Diagnosis not present

## 2016-01-13 DIAGNOSIS — I872 Venous insufficiency (chronic) (peripheral): Secondary | ICD-10-CM | POA: Diagnosis not present

## 2016-01-13 DIAGNOSIS — L97811 Non-pressure chronic ulcer of other part of right lower leg limited to breakdown of skin: Secondary | ICD-10-CM | POA: Diagnosis not present

## 2016-01-15 DIAGNOSIS — E1142 Type 2 diabetes mellitus with diabetic polyneuropathy: Secondary | ICD-10-CM | POA: Diagnosis not present

## 2016-01-15 DIAGNOSIS — L97811 Non-pressure chronic ulcer of other part of right lower leg limited to breakdown of skin: Secondary | ICD-10-CM | POA: Diagnosis not present

## 2016-01-15 DIAGNOSIS — L97821 Non-pressure chronic ulcer of other part of left lower leg limited to breakdown of skin: Secondary | ICD-10-CM | POA: Diagnosis not present

## 2016-01-15 DIAGNOSIS — I872 Venous insufficiency (chronic) (peripheral): Secondary | ICD-10-CM | POA: Diagnosis not present

## 2016-01-15 DIAGNOSIS — M6281 Muscle weakness (generalized): Secondary | ICD-10-CM | POA: Diagnosis not present

## 2016-01-16 DIAGNOSIS — L97821 Non-pressure chronic ulcer of other part of left lower leg limited to breakdown of skin: Secondary | ICD-10-CM | POA: Diagnosis not present

## 2016-01-16 DIAGNOSIS — I872 Venous insufficiency (chronic) (peripheral): Secondary | ICD-10-CM | POA: Diagnosis not present

## 2016-01-16 DIAGNOSIS — L97811 Non-pressure chronic ulcer of other part of right lower leg limited to breakdown of skin: Secondary | ICD-10-CM | POA: Diagnosis not present

## 2016-01-16 DIAGNOSIS — M6281 Muscle weakness (generalized): Secondary | ICD-10-CM | POA: Diagnosis not present

## 2016-01-16 DIAGNOSIS — E1142 Type 2 diabetes mellitus with diabetic polyneuropathy: Secondary | ICD-10-CM | POA: Diagnosis not present

## 2016-01-17 DIAGNOSIS — M5136 Other intervertebral disc degeneration, lumbar region: Secondary | ICD-10-CM | POA: Diagnosis not present

## 2016-01-17 DIAGNOSIS — M6281 Muscle weakness (generalized): Secondary | ICD-10-CM | POA: Diagnosis not present

## 2016-01-17 DIAGNOSIS — M62830 Muscle spasm of back: Secondary | ICD-10-CM | POA: Diagnosis not present

## 2016-01-17 DIAGNOSIS — M545 Low back pain: Secondary | ICD-10-CM | POA: Diagnosis not present

## 2016-01-23 DIAGNOSIS — E1142 Type 2 diabetes mellitus with diabetic polyneuropathy: Secondary | ICD-10-CM | POA: Diagnosis not present

## 2016-01-23 DIAGNOSIS — M6281 Muscle weakness (generalized): Secondary | ICD-10-CM | POA: Diagnosis not present

## 2016-01-23 DIAGNOSIS — L97811 Non-pressure chronic ulcer of other part of right lower leg limited to breakdown of skin: Secondary | ICD-10-CM | POA: Diagnosis not present

## 2016-01-23 DIAGNOSIS — L97821 Non-pressure chronic ulcer of other part of left lower leg limited to breakdown of skin: Secondary | ICD-10-CM | POA: Diagnosis not present

## 2016-01-23 DIAGNOSIS — I872 Venous insufficiency (chronic) (peripheral): Secondary | ICD-10-CM | POA: Diagnosis not present

## 2016-04-15 DIAGNOSIS — G8190 Hemiplegia, unspecified affecting unspecified side: Secondary | ICD-10-CM | POA: Diagnosis not present

## 2016-04-15 DIAGNOSIS — E1142 Type 2 diabetes mellitus with diabetic polyneuropathy: Secondary | ICD-10-CM | POA: Diagnosis not present

## 2016-04-15 DIAGNOSIS — I10 Essential (primary) hypertension: Secondary | ICD-10-CM | POA: Diagnosis not present

## 2016-06-22 ENCOUNTER — Telehealth: Payer: Self-pay

## 2016-06-22 NOTE — Telephone Encounter (Signed)
PT is aware of recommendations and aware of her appt on 06/25/2016 at 9:00 AM with Roseanne Kaufman, NP.

## 2016-06-22 NOTE — Telephone Encounter (Signed)
Add supplemental fiber such as Metamucil or benefiber. Hemorrhoids could be causing some of these symptoms. Likely abdominal discomfort from constipation. Avoid constipation, and let's have her return for non-urgent follow-up. She should be scheduled soon anyway?

## 2016-06-22 NOTE — Telephone Encounter (Signed)
PT left VM that she wanted Dr. Oneida Alar to call her.  I called and she said she has more than one BM sometimes and the second one is just a gel like substance. She has some abdominal pain at times in the bottom of her stomach and some blood on tissue when she wipes but she said she knows she has hemorrhoids. Forwarding to Roseanne Kaufman, NP to advise, since she saw pt in the office last.

## 2016-06-25 ENCOUNTER — Ambulatory Visit (INDEPENDENT_AMBULATORY_CARE_PROVIDER_SITE_OTHER): Payer: Medicare HMO | Admitting: Gastroenterology

## 2016-06-25 ENCOUNTER — Encounter: Payer: Self-pay | Admitting: Gastroenterology

## 2016-06-25 DIAGNOSIS — K642 Third degree hemorrhoids: Secondary | ICD-10-CM | POA: Insufficient documentation

## 2016-06-25 MED ORDER — HYDROCORTISONE 2.5 % RE CREA: 1.0000 "application " | TOPICAL_CREAM | Freq: Two times a day (BID) | RECTAL | 1 refills | Status: DC

## 2016-06-25 NOTE — Progress Notes (Signed)
Referring Provider: [name], MD Primary Care Physician:  [name], MD Primary GI: Dr. [name]   Chief Complaint  Patient presents with  . Hemorrhoids    change in bowels    HPI:   [name] is an 82 y.o. female presenting today with a history of constipation, cervical esophageal web s/p dilation Nov 2015. Here for routine visit.    Last colonoscopy in 2015.  No FH of colon cancer. States she feels like she has pressure in her rectum at times. Sometimes a little ball will come out while she is walking and not aware of it. Takes Amitiza 24 mcg as needed. Sometimes gel-like substance from rectum. Sometimes blood with wiping. Symptoms for "quite a while". BMs not regular, sometimes goes a few days without. Hasn't taken Amitiza in about 2 weeks. Some lower abdominal discomfort.   Past Medical History:  Diagnosis Date  . Arthritis   . CVA (cerebral infarction)   . Diabetes mellitus without complication ([place])   . GERD (gastroesophageal reflux disease)   . Hypertension   . Stroke [facility])     Past Surgical History:  Procedure Laterality Date  . BACK SURGERY    . CATARACT EXTRACTION W/PHACO Left 02/28/2013   Procedure: CATARACT EXTRACTION PHACO AND INTRAOCULAR LENS PLACEMENT (IOL) CDE=15.60;  Surgeon: Elta Guadeloupe T. Gershon Crane, MD;  Location: [facility];  Service: Ophthalmology;  Laterality: Left;  . CHOLECYSTECTOMY    . COLONOSCOPY  12/12/2003   RMR: Normal rectum.  Long capacious tortuous colon, colonic mucosa appeared normal  . COLONOSCOPY N/A 03/21/2014   Dr. [name]: internal and external hemorrhoids, torturous colon, colonic diverticulosis   . ESOPHAGOGASTRODUODENOSCOPY  12/28/2001   [id number] sliding hiatal hernia/. Three small bulbar ulcers, two with stigmata of bleeding and these were coagulated using heater probe unit   . ESOPHAGOGASTRODUODENOSCOPY N/A 03/21/2014   Dr. [name]: cervical esophageal web s/p dilation, negative H.pylori   . EYE SURGERY    . HIP FRACTURE SURGERY   2008  . JOINT REPLACEMENT     Rt hip, ????? sounds like just for fracture  . MALONEY DILATION N/A 03/21/2014   Procedure: [name] DILATION;  Surgeon: [name], MD;  [facility];  Service: Endoscopy;  Laterality: N/A;  . SAVORY DILATION N/A 03/21/2014   Procedure: SAVORY DILATION;  Surgeon: [name], MD;  [facility];  Service: Endoscopy;  Laterality: N/A;    Current Outpatient Prescriptions  Medication Sig Dispense Refill  . amLODipine (NORVASC) 10 MG tablet Take 10 mg by mouth daily.    . furosemide (LASIX) 20 MG tablet Take 20 mg by mouth daily. PT said she has been out of the Lasix x 2 weeks    . gabapentin (NEURONTIN) 300 MG capsule Take 300 mg by mouth 2 (two) times daily. PT said she only takes once daily    . hydrocortisone (ANUSOL-HC) 2.5 % rectal cream Place 1 application rectally 2 (two) times daily. 30 g 1  . insulin detemir (LEVEMIR) 100 UNIT/ML injection Inject 80 Units into the skin at bedtime.     . insulin lispro (HUMALOG) 100 UNIT/ML injection Inject 25 Units into the skin 3 (three) times daily before meals.     . lubiprostone (AMITIZA) 24 MCG capsule Take 1 capsule (24 mcg total) by mouth 2 (two) times daily with a meal. (Patient taking differently: Take 24 mcg by mouth 2 (two) times daily with a meal. Takes as needed) 60 capsule 3  . pantoprazole (PROTONIX)  40 MG tablet Take 1 tablet (40 mg total) by mouth daily. 90 tablet 3  . simvastatin (ZOCOR) 20 MG tablet Take 20 mg by mouth daily.    . valsartan (DIOVAN) 320 MG tablet Take 1 tablet by mouth daily.     No current facility-administered medications for this visit.     Allergies as of 06/25/2016  . (No Known Allergies)    Family History  Problem Relation Age of Onset  . Crohn's disease Daughter   . Colon cancer Neg Hx     Social History   Social History  . Marital status: Widowed    Spouse name: N/A  . Number of children: 8  . Years of education: N/A   Social History  Main Topics  . Smoking status: Never Smoker  . Smokeless tobacco: Never Used  . Alcohol use No  . Drug use: No  . Sexual activity: Not on file   Other Topics Concern  . Not on file   Social History Narrative  . No narrative on file    Review of Systems: As noted in HPI   Physical Exam: BP (!) 159/72   Pulse 96   Temp 98.2 F (36.8 C) (Oral)   Ht 5\' 6"  (1.676 m)   Wt 228 lb 3.2 oz (103.5 kg)   BMI 36.83 kg/m  General:   Alert and oriented. No distress noted. Pleasant and cooperative.  Head:  Normocephalic and atraumatic. Eyes:  Conjuctiva clear without scleral icterus. Mouth:  Oral mucosa pink and moist. Good dentition. No lesions. Heart:  S1, S2 present without murmurs Abdomen:  +BS, soft, non-tender and non-distended. No rebound or guarding. No HSM or masses noted. Rectal: Grade 2-3 prolapsed internal hemorrhoids, non-thrombosed, reduced Msk:  Symmetrical without gross deformities. Normal posture. Extremities:  Without edema. Neurologic:  Alert and  oriented x4;  grossly normal neurologically. Psych:  Alert and cooperative. Normal mood and affect.

## 2016-06-25 NOTE — Patient Instructions (Signed)
I have sent a cream to your pharmacy to use twice a day for 7 days. Give it a break after that for a few days, and you can use again for another course if needed.  Start taking Amitiza 24 mcg once each morning with food to avoid nausea. You may increase to twice a day if needed.   Start taking Metamucil or Benefiber daily. If you get crampy or don't tolerate it, you can decrease the dose.   We will see you in 6-8 weeks!  Call if no improvement.

## 2016-06-28 NOTE — Assessment & Plan Note (Addendum)
In setting of constipation. Start taking Amitiza 24 mcg daily and increase to BID if needed (currently only taking prn). Add supplemental fiber. Anusol cream to pharmacy. Last colonoscopy in 2015. Return in 6-8  weeks for close follow-up. May be a good candidate for hemorrhoid banding in future but will attempt more dedicated bowel regimen and supportive measures first.

## 2016-06-29 NOTE — Progress Notes (Signed)
cc'ed to pcp °

## 2016-07-03 ENCOUNTER — Other Ambulatory Visit: Payer: Self-pay | Admitting: Gastroenterology

## 2016-07-20 DIAGNOSIS — H1045 Other chronic allergic conjunctivitis: Secondary | ICD-10-CM | POA: Diagnosis not present

## 2016-07-20 DIAGNOSIS — M549 Dorsalgia, unspecified: Secondary | ICD-10-CM | POA: Diagnosis not present

## 2016-07-20 DIAGNOSIS — I6350 Cerebral infarction due to unspecified occlusion or stenosis of unspecified cerebral artery: Secondary | ICD-10-CM | POA: Diagnosis not present

## 2016-07-20 DIAGNOSIS — E1142 Type 2 diabetes mellitus with diabetic polyneuropathy: Secondary | ICD-10-CM | POA: Diagnosis not present

## 2016-07-20 DIAGNOSIS — Z0000 Encounter for general adult medical examination without abnormal findings: Secondary | ICD-10-CM | POA: Diagnosis not present

## 2016-07-20 DIAGNOSIS — R300 Dysuria: Secondary | ICD-10-CM | POA: Diagnosis not present

## 2016-07-20 DIAGNOSIS — I10 Essential (primary) hypertension: Secondary | ICD-10-CM | POA: Diagnosis not present

## 2016-07-20 DIAGNOSIS — I872 Venous insufficiency (chronic) (peripheral): Secondary | ICD-10-CM | POA: Diagnosis not present

## 2016-07-20 DIAGNOSIS — G8190 Hemiplegia, unspecified affecting unspecified side: Secondary | ICD-10-CM | POA: Diagnosis not present

## 2016-08-12 ENCOUNTER — Ambulatory Visit (INDEPENDENT_AMBULATORY_CARE_PROVIDER_SITE_OTHER): Payer: Medicare HMO | Admitting: Gastroenterology

## 2016-08-12 ENCOUNTER — Encounter: Payer: Self-pay | Admitting: Gastroenterology

## 2016-08-12 VITALS — BP 179/76 | HR 83 | Temp 97.6°F | Ht 65.0 in | Wt 223.2 lb

## 2016-08-12 DIAGNOSIS — K648 Other hemorrhoids: Secondary | ICD-10-CM

## 2016-08-12 DIAGNOSIS — K5900 Constipation, unspecified: Secondary | ICD-10-CM

## 2016-08-12 NOTE — Assessment & Plan Note (Signed)
Amitiza 24 mcg seems to be too strong. Will reduce to 8 mcg once daily, increasing to twice a day if tolerated. She has had some incontinence episodes with 24 mcg but will be constipated without anything. If 8 mcg is too strong as well, then I have given her samples of Miralax to trial each evening as needed. Add supplemental fiber daily (samples provided).

## 2016-08-12 NOTE — Assessment & Plan Note (Signed)
History of internal hemorrhoids, and at last visit I noted prolapsed, easily reducible hemorrhoids. Scant bleeding at times, and this is bothersome to her. Last colonoscopy in 2015. Will have her come for an appt for hemorrhoid banding as appropriate.

## 2016-08-12 NOTE — Patient Instructions (Signed)
I would like for you to stop Amitiza 24 mcg. Try the new dosage I am giving you (8 microgram gelcaps). Take one gelcap with dinner each evening. If needed, you can increase to 2 per day, breakfast and dinner.   If the Amitiza is too strong even with the smaller dosage, let's try Miralax each evening IF you haven't had a bowel movement that day.   Start taking Metamucil each day. I gave you samples.   Please call with how you are doing.   Dr. [name] will see you in the near future for hemorrhoid banding.

## 2016-08-12 NOTE — Progress Notes (Signed)
cc'ed to pcp °

## 2016-08-12 NOTE — Progress Notes (Signed)
Referring Provider: [name], MD Primary Care Physician:  [name], MD  Primary GI: Dr. [name]   Chief Complaint  Patient presents with  . Hemorrhoids    some bleeding  . Constipation    sometimes    HPI:   [name] is a 82 y.o. female presenting today with a history of constipation, prolapsed internal hemorrhoids. Last colonoscopy in 2015. No FH of colon cancer. Low-volume blood with wiping, constipation. Last seen Jun 25, 2016. Asked to take Amitiza daily to BID as directed instead of prn, add supplemental fiber, trial of Anusol cream.   Not taking Amitiza daily, as she is afraid she will mess her clothes up. Will sometimes have blood on tissue. Sometimes pain in lower abdomen.   Past Medical History:  Diagnosis Date  . Arthritis   . CVA (cerebral infarction)   . Diabetes mellitus without complication ([place])   . GERD (gastroesophageal reflux disease)   . Hypertension   . Stroke [facility])     Past Surgical History:  Procedure Laterality Date  . BACK SURGERY    . CATARACT EXTRACTION W/PHACO Left 02/28/2013   Procedure: CATARACT EXTRACTION PHACO AND INTRAOCULAR LENS PLACEMENT (IOL) CDE=15.60;  Surgeon: Elta Guadeloupe T. Gershon Crane, MD;  Location: [facility];  Service: Ophthalmology;  Laterality: Left;  . CHOLECYSTECTOMY    . COLONOSCOPY  12/12/2003   RMR: Normal rectum.  Long capacious tortuous colon, colonic mucosa appeared normal  . COLONOSCOPY N/A 03/21/2014   Dr. [name]: internal and external hemorrhoids, torturous colon, colonic diverticulosis   . ESOPHAGOGASTRODUODENOSCOPY  12/28/2001   TKW:IOXBD sliding hiatal hernia/. Three small bulbar ulcers, two with stigmata of bleeding and these were coagulated using heater probe unit   . ESOPHAGOGASTRODUODENOSCOPY N/A 03/21/2014   Dr. [name]: cervical esophageal web s/p dilation, negative H.pylori   . EYE SURGERY    . HIP FRACTURE SURGERY  2008  . JOINT REPLACEMENT     Rt hip, ????? sounds like just for fracture  . MALONEY DILATION  N/A 03/21/2014   Procedure: [name] DILATION;  Surgeon: [name], MD;  Location: [facility];  Service: Endoscopy;  Laterality: N/A;  . SAVORY DILATION N/A 03/21/2014   Procedure: SAVORY DILATION;  Surgeon: [name], MD;  Location: [facility];  Service: Endoscopy;  Laterality: N/A;    Current Outpatient Prescriptions  Medication Sig Dispense Refill  . amLODipine (NORVASC) 10 MG tablet Take 10 mg by mouth daily.    . furosemide (LASIX) 20 MG tablet Take 20 mg by mouth daily.     Marland Kitchen gabapentin (NEURONTIN) 300 MG capsule Take 300 mg by mouth 2 (two) times daily.     . hydrALAZINE (APRESOLINE) 25 MG tablet Take 25 mg by mouth 2 (two) times daily.    . hydrocortisone (ANUSOL-HC) 2.5 % rectal cream Place 1 application rectally 2 (two) times daily. 30 g 1  . insulin detemir (LEVEMIR) 100 UNIT/ML injection Inject 80 Units into the skin at bedtime.     . insulin lispro (HUMALOG) 100 UNIT/ML injection Inject 30 Units into the skin 3 (three) times daily before meals.     Marland Kitchen losartan (COZAAR) 100 MG tablet Take 100 mg by mouth daily.    Marland Kitchen lubiprostone (AMITIZA) 24 MCG capsule Take 1 capsule (24 mcg total) by mouth 2 (two) times daily with a meal. (Patient taking differently: Take 24 mcg by mouth 2 (two) times daily with a meal. Takes as needed) 60 capsule 3  . pantoprazole (PROTONIX) 40  MG tablet TAKE 1 TABLET BY MOUTH ONCE DAILY. 90 tablet 3  . simvastatin (ZOCOR) 20 MG tablet Take 20 mg by mouth daily.    . valsartan (DIOVAN) 320 MG tablet Take 1 tablet by mouth daily.     No current facility-administered medications for this visit.     Allergies as of 08/12/2016  . (No Known Allergies)    Family History  Problem Relation Age of Onset  . Crohn's disease Daughter   . Colon cancer Neg Hx     Social History   Social History  . Marital status: Widowed    Spouse name: N/A  . Number of children: 8  . Years of education: N/A   Social History Main Topics  . Smoking status: Never  Smoker  . Smokeless tobacco: Never Used  . Alcohol use No  . Drug use: No  . Sexual activity: Not Asked   Other Topics Concern  . None   Social History Narrative  . None    Review of Systems: As mentioned in HPI   Physical Exam: BP (!) 179/76   Pulse 83   Temp 97.6 F (36.4 C) (Oral)   Ht 5\' 5"  (1.651 m)   Wt 223 lb 3.2 oz (101.2 kg)   BMI 37.14 kg/m  General:   Alert and oriented. No distress noted. Pleasant and cooperative.  Head:  Normocephalic and atraumatic. Eyes:  Conjuctiva clear without scleral icterus. Abdomen:  +BS, soft, non-tender and non-distended. No rebound or guarding. No HSM or masses noted. Msk:  Kyphosis, left-sided weakness from stroke, uses walker  Neurologic:  Alert and  oriented x4 Psych:  Alert and cooperative. Normal mood and affect.

## 2016-09-22 ENCOUNTER — Ambulatory Visit (INDEPENDENT_AMBULATORY_CARE_PROVIDER_SITE_OTHER): Payer: Medicare HMO | Admitting: Internal Medicine

## 2016-09-22 ENCOUNTER — Encounter: Payer: Self-pay | Admitting: Internal Medicine

## 2016-09-22 VITALS — BP 185/79 | HR 82 | Temp 97.0°F | Ht 65.0 in | Wt 226.0 lb

## 2016-09-22 DIAGNOSIS — K5901 Slow transit constipation: Secondary | ICD-10-CM | POA: Diagnosis not present

## 2016-09-22 DIAGNOSIS — K648 Other hemorrhoids: Secondary | ICD-10-CM | POA: Diagnosis not present

## 2016-09-22 NOTE — Progress Notes (Signed)
[place] banding procedure note:  The patient presents with symptomatic grade 3 hemorrhoids, unresponsive to maximal medical therapy, requesting rubber band ligation of her hemorrhoidal disease. Takes Amitiza only sporadically. When she does take it works well to manage constipation. Sometimes still spends 10-15 minutes on the toilet to achieve a bowel movement. All risks, benefits, and alternative forms of therapy were described and informed consent was obtained.  In the left lateral decubitus position, DRE revealed grade 3 hemorrhoid tags. Utilizing Xylocaine ointment as lubricant. Otherwise, negative. Anoscopy performed. Revealing prominent right posterior and anterior hemorrhoid columns. The posterior, the right was quite angry appearing. Otherwise negative.  The decision was made to band the right posterior internal hemorrhoid column and the [place] was used to perform band ligation without complication. Digital anorectal examination was then performed to assure proper positioning of the band;   Band found to be n excellent position..  Again, no pinching or pain.The patient was discharged home without pain or other issues. Dietary and behavioral recommendations were given  along with follow-up instructions. The patient will return in 4 weeks for followup and possible additional banding as required.  Patient urged to take Amitiza twice daily without fail.  No complications were encountered and the patient tolerated the procedure well.

## 2016-09-22 NOTE — Patient Instructions (Signed)
Avoid straining.  Take 1 dose of Metamucil daily  Limit toilet time to 5 minutes  Take Amitiza twice daily without fail to avoid constipation  Call with any interim problems  Schedule followup appointment in 4 weeks from now

## 2016-10-13 ENCOUNTER — Encounter: Payer: Self-pay | Admitting: Internal Medicine

## 2016-10-13 ENCOUNTER — Ambulatory Visit: Payer: Medicare HMO | Admitting: Internal Medicine

## 2016-10-13 VITALS — BP 160/81 | HR 90 | Temp 97.9°F | Ht 65.0 in | Wt 223.4 lb

## 2016-10-13 DIAGNOSIS — K219 Gastro-esophageal reflux disease without esophagitis: Secondary | ICD-10-CM

## 2016-10-13 DIAGNOSIS — K5909 Other constipation: Secondary | ICD-10-CM

## 2016-10-13 NOTE — Patient Instructions (Signed)
Continue Amitiza for constipation  Continue Protonix daily for reflux  Office visit in 6 months and as needed

## 2016-10-13 NOTE — Progress Notes (Signed)
Primary Care Physician:  [name], MD Primary Gastroenterologist:  Dr. [name]  Pre-Procedure History & Physical: HPI:  [name] is a 82 y.o. female here for follow-up of symptomatically hemorrhoids. Underwent banding of the right posterior hemorrhoidal column previously. Continues on Amitiza and Protonix.  She states she feels great today. Her hemorrhage symptoms totally resolved after 1 band. Bowel function good on Amitiza. Reflux symptoms well controlled on Protonix 40 mg daily  Past Medical History:  Diagnosis Date  . Arthritis   . CVA (cerebral infarction)   . Diabetes mellitus without complication ([place])   . GERD (gastroesophageal reflux disease)   . Hypertension   . Stroke [facility])     Past Surgical History:  Procedure Laterality Date  . BACK SURGERY    . CATARACT EXTRACTION W/PHACO Left 02/28/2013   Procedure: CATARACT EXTRACTION PHACO AND INTRAOCULAR LENS PLACEMENT (IOL) CDE=15.60;  Surgeon: Elta Guadeloupe T. Gershon Crane, MD;  Location: [facility];  Service: Ophthalmology;  Laterality: Left;  . CHOLECYSTECTOMY    . COLONOSCOPY  12/12/2003   RMR: Normal rectum.  Long capacious tortuous colon, colonic mucosa appeared normal  . COLONOSCOPY N/A 03/21/2014   Dr. [name]: internal and external hemorrhoids, torturous colon, colonic diverticulosis   . ESOPHAGOGASTRODUODENOSCOPY  12/28/2001   KGU:[id number] sliding hiatal hernia/. Three small bulbar ulcers, two with stigmata of bleeding and these were coagulated using heater probe unit   . ESOPHAGOGASTRODUODENOSCOPY N/A 03/21/2014   Dr. [name]: cervical esophageal web s/p dilation, negative H.pylori   . EYE SURGERY    . HIP FRACTURE SURGERY  2008  . JOINT REPLACEMENT     Rt hip, ????? sounds like just for fracture  . MALONEY DILATION N/A 03/21/2014   Procedure: [name] DILATION;  Surgeon: [name], MD;  Location[facility];  Service: Endoscopy;  Laterality: N/A;  . SAVORY DILATION N/A 03/21/2014   Procedure: SAVORY DILATION;  Surgeon:  [name], MD;  Location: [facility];  Service: Endoscopy;  Laterality: N/A;    Prior to Admission medications   Medication Sig Start Date End Date Taking? Authorizing Provider  amLODipine (NORVASC) 10 MG tablet Take 10 mg by mouth daily.   Yes [provider]  furosemide (LASIX) 20 MG tablet Take 20 mg by mouth daily.    Yes [provider]  gabapentin (NEURONTIN) 300 MG capsule Take 300 mg by mouth 2 (two) times daily.  02/22/14  Yes [provider]  hydrALAZINE (APRESOLINE) 25 MG tablet Take 25 mg by mouth 2 (two) times daily. 07/20/16  Yes [provider]  hydrocortisone (ANUSOL-HC) 2.5 % rectal cream Place 1 application rectally 2 (two) times daily. 06/25/16  Yes Annitta Needs, NP  insulin detemir (LEVEMIR) 100 UNIT/ML injection Inject 80 Units into the skin at bedtime.    Yes [provider]  insulin lispro (HUMALOG) 100 UNIT/ML injection Inject 30 Units into the skin 3 (three) times daily before meals.    Yes [provider]  losartan (COZAAR) 100 MG tablet Take 100 mg by mouth daily. 07/20/16  Yes [provider]  lubiprostone (AMITIZA) 24 MCG capsule Take 1 capsule (24 mcg total) by mouth 2 (two) times daily with a meal. Patient taking differently: Take 24 mcg by mouth 2 (two) times daily with a meal. Takes as needed 05/08/15  Yes Annitta Needs, NP  pantoprazole (PROTONIX) 40 MG tablet TAKE 1 TABLET BY MOUTH ONCE DAILY. 07/06/16  Yes Annitta Needs, NP  simvastatin (ZOCOR) 20  MG tablet Take 20 mg by mouth daily.   Yes [provider]  valsartan (DIOVAN) 320 MG tablet Take 1 tablet by mouth daily. 08/18/13  Yes [provider]    Allergies as of 10/13/2016  . (No Known Allergies)    Family History  Problem Relation Age of Onset  . Crohn's disease Daughter   . Colon cancer Neg Hx     Social History   Social History  . Marital status: Widowed    Spouse name: N/A  . Number of children: 8  . Years of  education: N/A   Occupational History  . Not on file.   Social History Main Topics  . Smoking status: Never Smoker  . Smokeless tobacco: Never Used  . Alcohol use No  . Drug use: No  . Sexual activity: Not on file   Other Topics Concern  . Not on file   Social History Narrative  . No narrative on file    Review of Systems: See HPI, otherwise negative ROS  Physical Exam: BP (!) 160/81   Pulse 90   Temp 97.9 F (36.6 C) (Oral)   Ht 5\' 5"  (1.651 m)   Wt 223 lb 6.4 oz (101.3 kg)   BMI 37.18 kg/m  General:   Alert,   pleasant and cooperative in NAD. Accompanied by her daughter. Ambulates with a walker. Heart:  Regular rate and rhythm; no murmurs, clicks, rubs,  or gallops. Abdomen: Non-distended, normal bowel sounds.  Soft and nontender without appreciable mass or hepatosplenomegaly.  Pulses:  Normal pulses noted. Extremities:  1+ LE edema.   Impression:  Symptomatically much improved with hemorrhoid banding as described. Constipation well-managed with Amitiza.  Protonix 40 mg once daily managing reflux symptoms very well.  Recommendations:  Continue Amitiza for constipation  Continue Protonix daily for reflux  Office visit in 6 months and as needed      Notice: This dictation was prepared with Dragon dictation along with smaller phrase technology. Any transcriptional errors that result from this process are unintentional and may not be corrected upon review.

## 2016-10-19 DIAGNOSIS — I10 Essential (primary) hypertension: Secondary | ICD-10-CM | POA: Diagnosis not present

## 2016-10-19 DIAGNOSIS — E1142 Type 2 diabetes mellitus with diabetic polyneuropathy: Secondary | ICD-10-CM | POA: Diagnosis not present

## 2016-10-19 DIAGNOSIS — K219 Gastro-esophageal reflux disease without esophagitis: Secondary | ICD-10-CM | POA: Diagnosis not present

## 2016-10-19 DIAGNOSIS — G8190 Hemiplegia, unspecified affecting unspecified side: Secondary | ICD-10-CM | POA: Diagnosis not present

## 2016-10-22 DIAGNOSIS — E1142 Type 2 diabetes mellitus with diabetic polyneuropathy: Secondary | ICD-10-CM | POA: Diagnosis not present

## 2016-10-22 DIAGNOSIS — I10 Essential (primary) hypertension: Secondary | ICD-10-CM | POA: Diagnosis not present

## 2016-10-22 DIAGNOSIS — I872 Venous insufficiency (chronic) (peripheral): Secondary | ICD-10-CM | POA: Diagnosis not present

## 2016-10-22 DIAGNOSIS — G8190 Hemiplegia, unspecified affecting unspecified side: Secondary | ICD-10-CM | POA: Diagnosis not present

## 2016-10-22 DIAGNOSIS — K219 Gastro-esophageal reflux disease without esophagitis: Secondary | ICD-10-CM | POA: Diagnosis not present

## 2016-10-22 DIAGNOSIS — M549 Dorsalgia, unspecified: Secondary | ICD-10-CM | POA: Diagnosis not present

## 2016-10-22 DIAGNOSIS — H1045 Other chronic allergic conjunctivitis: Secondary | ICD-10-CM | POA: Diagnosis not present

## 2016-11-13 DIAGNOSIS — M545 Low back pain: Secondary | ICD-10-CM | POA: Diagnosis not present

## 2016-11-13 DIAGNOSIS — I679 Cerebrovascular disease, unspecified: Secondary | ICD-10-CM | POA: Diagnosis not present

## 2016-11-13 DIAGNOSIS — I6350 Cerebral infarction due to unspecified occlusion or stenosis of unspecified cerebral artery: Secondary | ICD-10-CM | POA: Diagnosis not present

## 2016-11-13 DIAGNOSIS — G8190 Hemiplegia, unspecified affecting unspecified side: Secondary | ICD-10-CM | POA: Diagnosis not present

## 2016-12-13 DIAGNOSIS — I679 Cerebrovascular disease, unspecified: Secondary | ICD-10-CM | POA: Diagnosis not present

## 2016-12-13 DIAGNOSIS — M545 Low back pain: Secondary | ICD-10-CM | POA: Diagnosis not present

## 2016-12-13 DIAGNOSIS — I6350 Cerebral infarction due to unspecified occlusion or stenosis of unspecified cerebral artery: Secondary | ICD-10-CM | POA: Diagnosis not present

## 2016-12-13 DIAGNOSIS — G8190 Hemiplegia, unspecified affecting unspecified side: Secondary | ICD-10-CM | POA: Diagnosis not present

## 2017-01-13 DIAGNOSIS — I679 Cerebrovascular disease, unspecified: Secondary | ICD-10-CM | POA: Diagnosis not present

## 2017-01-13 DIAGNOSIS — G8190 Hemiplegia, unspecified affecting unspecified side: Secondary | ICD-10-CM | POA: Diagnosis not present

## 2017-01-13 DIAGNOSIS — M545 Low back pain: Secondary | ICD-10-CM | POA: Diagnosis not present

## 2017-01-13 DIAGNOSIS — I6350 Cerebral infarction due to unspecified occlusion or stenosis of unspecified cerebral artery: Secondary | ICD-10-CM | POA: Diagnosis not present

## 2017-01-19 DIAGNOSIS — G8190 Hemiplegia, unspecified affecting unspecified side: Secondary | ICD-10-CM | POA: Diagnosis not present

## 2017-01-19 DIAGNOSIS — I10 Essential (primary) hypertension: Secondary | ICD-10-CM | POA: Diagnosis not present

## 2017-01-19 DIAGNOSIS — E1142 Type 2 diabetes mellitus with diabetic polyneuropathy: Secondary | ICD-10-CM | POA: Diagnosis not present

## 2017-01-19 DIAGNOSIS — K219 Gastro-esophageal reflux disease without esophagitis: Secondary | ICD-10-CM | POA: Diagnosis not present

## 2017-01-19 DIAGNOSIS — Z1389 Encounter for screening for other disorder: Secondary | ICD-10-CM | POA: Diagnosis not present

## 2017-01-20 ENCOUNTER — Other Ambulatory Visit (HOSPITAL_COMMUNITY): Payer: Self-pay | Admitting: Internal Medicine

## 2017-01-20 DIAGNOSIS — Z780 Asymptomatic menopausal state: Secondary | ICD-10-CM

## 2017-01-27 ENCOUNTER — Ambulatory Visit (HOSPITAL_COMMUNITY)
Admission: RE | Admit: 2017-01-27 | Discharge: 2017-01-27 | Disposition: A | Discharge status: Home / Self Care | Payer: Medicare HMO | Source: Ambulatory Visit | Attending: Internal Medicine | Admitting: Internal Medicine

## 2017-01-27 DIAGNOSIS — Z780 Asymptomatic menopausal state: Secondary | ICD-10-CM

## 2017-01-27 DIAGNOSIS — M8588 Other specified disorders of bone density and structure, other site: Secondary | ICD-10-CM | POA: Diagnosis not present

## 2017-01-27 DIAGNOSIS — M8589 Other specified disorders of bone density and structure, multiple sites: Secondary | ICD-10-CM | POA: Diagnosis not present

## 2017-02-13 DIAGNOSIS — G8190 Hemiplegia, unspecified affecting unspecified side: Secondary | ICD-10-CM | POA: Diagnosis not present

## 2017-02-13 DIAGNOSIS — I679 Cerebrovascular disease, unspecified: Secondary | ICD-10-CM | POA: Diagnosis not present

## 2017-02-13 DIAGNOSIS — I6350 Cerebral infarction due to unspecified occlusion or stenosis of unspecified cerebral artery: Secondary | ICD-10-CM | POA: Diagnosis not present

## 2017-02-13 DIAGNOSIS — M545 Low back pain: Secondary | ICD-10-CM | POA: Diagnosis not present

## 2017-02-18 ENCOUNTER — Encounter: Payer: Self-pay | Admitting: Internal Medicine

## 2017-03-15 DIAGNOSIS — I6350 Cerebral infarction due to unspecified occlusion or stenosis of unspecified cerebral artery: Secondary | ICD-10-CM | POA: Diagnosis not present

## 2017-03-15 DIAGNOSIS — M545 Low back pain: Secondary | ICD-10-CM | POA: Diagnosis not present

## 2017-03-15 DIAGNOSIS — I679 Cerebrovascular disease, unspecified: Secondary | ICD-10-CM | POA: Diagnosis not present

## 2017-03-15 DIAGNOSIS — G8190 Hemiplegia, unspecified affecting unspecified side: Secondary | ICD-10-CM | POA: Diagnosis not present

## 2017-04-15 DIAGNOSIS — G8190 Hemiplegia, unspecified affecting unspecified side: Secondary | ICD-10-CM | POA: Diagnosis not present

## 2017-04-15 DIAGNOSIS — M545 Low back pain: Secondary | ICD-10-CM | POA: Diagnosis not present

## 2017-04-15 DIAGNOSIS — I6350 Cerebral infarction due to unspecified occlusion or stenosis of unspecified cerebral artery: Secondary | ICD-10-CM | POA: Diagnosis not present

## 2017-04-15 DIAGNOSIS — I679 Cerebrovascular disease, unspecified: Secondary | ICD-10-CM | POA: Diagnosis not present

## 2017-04-28 ENCOUNTER — Ambulatory Visit: Payer: Medicare HMO | Admitting: Gastroenterology

## 2017-05-13 DIAGNOSIS — N390 Urinary tract infection, site not specified: Secondary | ICD-10-CM | POA: Diagnosis not present

## 2017-05-13 DIAGNOSIS — E1165 Type 2 diabetes mellitus with hyperglycemia: Secondary | ICD-10-CM | POA: Diagnosis not present

## 2017-05-13 DIAGNOSIS — E7849 Other hyperlipidemia: Secondary | ICD-10-CM | POA: Diagnosis not present

## 2017-05-13 DIAGNOSIS — E039 Hypothyroidism, unspecified: Secondary | ICD-10-CM | POA: Diagnosis not present

## 2017-06-23 ENCOUNTER — Encounter: Payer: Self-pay | Admitting: Gastroenterology

## 2017-06-23 ENCOUNTER — Ambulatory Visit (INDEPENDENT_AMBULATORY_CARE_PROVIDER_SITE_OTHER): Payer: Medicare HMO | Admitting: Gastroenterology

## 2017-06-23 VITALS — BP 196/84 | HR 97 | Temp 97.0°F | Ht 66.0 in | Wt 238.8 lb

## 2017-06-23 DIAGNOSIS — K5900 Constipation, unspecified: Secondary | ICD-10-CM

## 2017-06-23 DIAGNOSIS — K219 Gastro-esophageal reflux disease without esophagitis: Secondary | ICD-10-CM

## 2017-06-23 DIAGNOSIS — G8190 Hemiplegia, unspecified affecting unspecified side: Secondary | ICD-10-CM | POA: Diagnosis not present

## 2017-06-23 DIAGNOSIS — K642 Third degree hemorrhoids: Secondary | ICD-10-CM | POA: Diagnosis not present

## 2017-06-23 DIAGNOSIS — I10 Essential (primary) hypertension: Secondary | ICD-10-CM | POA: Diagnosis not present

## 2017-06-23 DIAGNOSIS — E1142 Type 2 diabetes mellitus with diabetic polyneuropathy: Secondary | ICD-10-CM | POA: Diagnosis not present

## 2017-06-23 NOTE — Patient Instructions (Addendum)
1. Continue pantoprazole 40 mg daily.  This for acid reflux or heartburn. 2. You may use Amitiza 1-2 times daily for constipation.  Take when needed only. 3. If you decide to have hemorrhoid banding done, please call and request an appointment for "hemorrhoid banding with Dr. [name]." 4. Please follow up with Dr. Legrand Rams for edema and management of your blood pressure, go straight to their office per Dr. Josephine Cables request.

## 2017-06-23 NOTE — Assessment & Plan Note (Signed)
Clinically doing well.  Return to the office in 1 year or sooner if needed.

## 2017-06-23 NOTE — Progress Notes (Signed)
Primary Care Physician: [name], MD  Primary Gastroenterologist:  Garfield Cornea, MD   Chief Complaint  Patient presents with  . Hemorrhoids    bleeding  . Gastroesophageal Reflux    f/u.     HPI: [name] is a 83 y.o. female here for f/u GERD, constipation, hemorrhoids. She has had right posterior internal hemorrhoid column banding back in 09/2016. Came back for additional banding but was doing so well decision was made to hold off.   From a GI standpoint she is stable.  Reflux is well controlled.  Denies dysphagia.  Has 1-2 soft loose bowel movements daily.  Takes Amitiza 8 mcg daily as needed but has not required it lately.  Denies melena.  She has had some bright red blood per rectum from her hemorrhoids again.  Does not happen very often.  Not ready to pursue additional hemorrhoid banding.  She is mostly concerned about her lower extremity edema.  Daughter states she is also been wheezing.  No reported respiratory distress however.  Therefore medications the patient states she is no longer taking which are all cardiac and fluid pills.  She is no longer on Norvasc, Lasix, losartan, valsartan.  She seems to be confused as to why she is not on it.  Keeps stating there was a recall on 1.  Also states that a provider at Dr. Josephine Cables office advised her to hold Lasix for about a week back in December and she never restarted it.  Denies dizziness, lightheadedness, headache.  She states her blood pressure today is actually better than it was back in December.  Currently 196/84.   Current Outpatient Medications  Medication Sig Dispense Refill  . gabapentin (NEURONTIN) 300 MG capsule Take 300 mg by mouth 2 (two) times daily.     . hydrALAZINE (APRESOLINE) 25 MG tablet Take 25 mg by mouth 2 (two) times daily.    . hydrocortisone (ANUSOL-HC) 2.5 % rectal cream Place 1 application rectally 2 (two) times daily. (Patient taking differently: Place 1 application rectally as needed. ) 30  g 1  . insulin detemir (LEVEMIR) 100 UNIT/ML injection Inject 80 Units into the skin at bedtime.     . insulin lispro (HUMALOG) 100 UNIT/ML injection Inject 30 Units into the skin 3 (three) times daily before meals.     . lubiprostone (AMITIZA) 8 MCG capsule Take 8 mcg by mouth 2 (two) times daily as needed for constipation.    . pantoprazole (PROTONIX) 40 MG tablet TAKE 1 TABLET BY MOUTH ONCE DAILY. 90 tablet 3  . potassium chloride SA (K-DUR,KLOR-CON) 20 MEQ tablet Take 20 mEq by mouth daily.    . simvastatin (ZOCOR) 20 MG tablet Take 20 mg by mouth daily.     No current facility-administered medications for this visit.     Allergies as of 06/23/2017  . (No Known Allergies)    ROS:  General: Negative for anorexia, weight loss, fever, chills, fatigue, weakness. ENT: Negative for hoarseness, difficulty swallowing , nasal congestion. CV: Negative for chest pain, angina, palpitations, dyspnea on exertion, peripheral edema.  Respiratory: Negative for dyspnea at rest, dyspnea on exertion, cough, sputum, wheezing.  GI: See history of present illness. GU:  Negative for dysuria, hematuria, urinary incontinence, urinary frequency, nocturnal urination.  Endo: Negative for unusual weight change.    Physical Examination:   BP (!) 196/84   Pulse 97   Temp (!) 97 F (36.1 C) (Oral)   Ht 5\' 6"  (1.676 m)  Wt 238 lb 12.8 oz (108.3 kg)   BMI 38.54 kg/m   General: Well-nourished, well-developed in no acute distress.  Eyes: No icterus. Mouth: Oropharyngeal mucosa moist and pink , no lesions erythema or exudate. Lungs: Clear to auscultation bilaterally.  Heart: Regular rate and rhythm, no murmurs rubs or gallops.  Abdomen: Bowel sounds are normal, nontender, nondistended, no hepatosplenomegaly or masses, no abdominal bruits or hernia , no rebound or guarding.   Extremities: No lower extremity edema. No clubbing or deformities. Neuro: Alert and oriented x 4   Skin: Warm and dry, no jaundice.    Psych: Alert and cooperative, normal mood and affect.

## 2017-06-23 NOTE — Progress Notes (Signed)
CC'D TO PCP °

## 2017-06-23 NOTE — Assessment & Plan Note (Signed)
Patient with significantly elevated BP today, lower extremity edema and apparently has come off of multiple medications. Dr. Josephine Cables office contacted and advised patient come there now for evaluation.

## 2017-06-23 NOTE — Assessment & Plan Note (Signed)
Clinically doing well at this time.  Taking Amitiza 8 mcg once daily as needed.  Really has not had to utilize it lately.  Call with any questions or concerns.  Last see back in 1 year

## 2017-06-23 NOTE — Assessment & Plan Note (Signed)
Patient has had some occasional intermittent bright red blood per rectum likely from her hemorrhoids.  She is not interested in pursuing additional banding at this time but we did discuss this is an option.  If she changes her mind she will call and schedule herself for hemorrhoid banding with Dr. [name].

## 2017-06-25 DIAGNOSIS — K219 Gastro-esophageal reflux disease without esophagitis: Secondary | ICD-10-CM | POA: Diagnosis not present

## 2017-06-25 DIAGNOSIS — I69351 Hemiplegia and hemiparesis following cerebral infarction affecting right dominant side: Secondary | ICD-10-CM | POA: Diagnosis not present

## 2017-06-25 DIAGNOSIS — E1142 Type 2 diabetes mellitus with diabetic polyneuropathy: Secondary | ICD-10-CM | POA: Diagnosis not present

## 2017-06-25 DIAGNOSIS — I872 Venous insufficiency (chronic) (peripheral): Secondary | ICD-10-CM | POA: Diagnosis not present

## 2017-06-25 DIAGNOSIS — I10 Essential (primary) hypertension: Secondary | ICD-10-CM | POA: Diagnosis not present

## 2017-06-25 DIAGNOSIS — Z6839 Body mass index (BMI) 39.0-39.9, adult: Secondary | ICD-10-CM | POA: Diagnosis not present

## 2017-06-25 DIAGNOSIS — M859 Disorder of bone density and structure, unspecified: Secondary | ICD-10-CM | POA: Diagnosis not present

## 2017-06-25 DIAGNOSIS — Z8781 Personal history of (healed) traumatic fracture: Secondary | ICD-10-CM | POA: Diagnosis not present

## 2017-06-29 DIAGNOSIS — M859 Disorder of bone density and structure, unspecified: Secondary | ICD-10-CM | POA: Diagnosis not present

## 2017-06-29 DIAGNOSIS — I10 Essential (primary) hypertension: Secondary | ICD-10-CM | POA: Diagnosis not present

## 2017-06-29 DIAGNOSIS — Z6839 Body mass index (BMI) 39.0-39.9, adult: Secondary | ICD-10-CM | POA: Diagnosis not present

## 2017-06-29 DIAGNOSIS — E1142 Type 2 diabetes mellitus with diabetic polyneuropathy: Secondary | ICD-10-CM | POA: Diagnosis not present

## 2017-06-29 DIAGNOSIS — Z8781 Personal history of (healed) traumatic fracture: Secondary | ICD-10-CM | POA: Diagnosis not present

## 2017-06-29 DIAGNOSIS — I69351 Hemiplegia and hemiparesis following cerebral infarction affecting right dominant side: Secondary | ICD-10-CM | POA: Diagnosis not present

## 2017-06-29 DIAGNOSIS — I872 Venous insufficiency (chronic) (peripheral): Secondary | ICD-10-CM | POA: Diagnosis not present

## 2017-06-29 DIAGNOSIS — K219 Gastro-esophageal reflux disease without esophagitis: Secondary | ICD-10-CM | POA: Diagnosis not present

## 2017-06-30 DIAGNOSIS — K219 Gastro-esophageal reflux disease without esophagitis: Secondary | ICD-10-CM | POA: Diagnosis not present

## 2017-06-30 DIAGNOSIS — I10 Essential (primary) hypertension: Secondary | ICD-10-CM | POA: Diagnosis not present

## 2017-06-30 DIAGNOSIS — I69351 Hemiplegia and hemiparesis following cerebral infarction affecting right dominant side: Secondary | ICD-10-CM | POA: Diagnosis not present

## 2017-06-30 DIAGNOSIS — E1142 Type 2 diabetes mellitus with diabetic polyneuropathy: Secondary | ICD-10-CM | POA: Diagnosis not present

## 2017-06-30 DIAGNOSIS — M859 Disorder of bone density and structure, unspecified: Secondary | ICD-10-CM | POA: Diagnosis not present

## 2017-06-30 DIAGNOSIS — Z8781 Personal history of (healed) traumatic fracture: Secondary | ICD-10-CM | POA: Diagnosis not present

## 2017-06-30 DIAGNOSIS — Z6839 Body mass index (BMI) 39.0-39.9, adult: Secondary | ICD-10-CM | POA: Diagnosis not present

## 2017-06-30 DIAGNOSIS — I872 Venous insufficiency (chronic) (peripheral): Secondary | ICD-10-CM | POA: Diagnosis not present

## 2017-07-01 DIAGNOSIS — K219 Gastro-esophageal reflux disease without esophagitis: Secondary | ICD-10-CM | POA: Diagnosis not present

## 2017-07-01 DIAGNOSIS — I872 Venous insufficiency (chronic) (peripheral): Secondary | ICD-10-CM | POA: Diagnosis not present

## 2017-07-01 DIAGNOSIS — I69351 Hemiplegia and hemiparesis following cerebral infarction affecting right dominant side: Secondary | ICD-10-CM | POA: Diagnosis not present

## 2017-07-01 DIAGNOSIS — I10 Essential (primary) hypertension: Secondary | ICD-10-CM | POA: Diagnosis not present

## 2017-07-01 DIAGNOSIS — M859 Disorder of bone density and structure, unspecified: Secondary | ICD-10-CM | POA: Diagnosis not present

## 2017-07-01 DIAGNOSIS — Z8781 Personal history of (healed) traumatic fracture: Secondary | ICD-10-CM | POA: Diagnosis not present

## 2017-07-01 DIAGNOSIS — E1142 Type 2 diabetes mellitus with diabetic polyneuropathy: Secondary | ICD-10-CM | POA: Diagnosis not present

## 2017-07-01 DIAGNOSIS — Z6839 Body mass index (BMI) 39.0-39.9, adult: Secondary | ICD-10-CM | POA: Diagnosis not present

## 2017-07-05 DIAGNOSIS — Z6839 Body mass index (BMI) 39.0-39.9, adult: Secondary | ICD-10-CM | POA: Diagnosis not present

## 2017-07-05 DIAGNOSIS — K219 Gastro-esophageal reflux disease without esophagitis: Secondary | ICD-10-CM | POA: Diagnosis not present

## 2017-07-05 DIAGNOSIS — I10 Essential (primary) hypertension: Secondary | ICD-10-CM | POA: Diagnosis not present

## 2017-07-05 DIAGNOSIS — M859 Disorder of bone density and structure, unspecified: Secondary | ICD-10-CM | POA: Diagnosis not present

## 2017-07-05 DIAGNOSIS — E1142 Type 2 diabetes mellitus with diabetic polyneuropathy: Secondary | ICD-10-CM | POA: Diagnosis not present

## 2017-07-05 DIAGNOSIS — I69351 Hemiplegia and hemiparesis following cerebral infarction affecting right dominant side: Secondary | ICD-10-CM | POA: Diagnosis not present

## 2017-07-05 DIAGNOSIS — I872 Venous insufficiency (chronic) (peripheral): Secondary | ICD-10-CM | POA: Diagnosis not present

## 2017-07-05 DIAGNOSIS — Z8781 Personal history of (healed) traumatic fracture: Secondary | ICD-10-CM | POA: Diagnosis not present

## 2017-07-06 DIAGNOSIS — E1142 Type 2 diabetes mellitus with diabetic polyneuropathy: Secondary | ICD-10-CM | POA: Diagnosis not present

## 2017-07-06 DIAGNOSIS — I872 Venous insufficiency (chronic) (peripheral): Secondary | ICD-10-CM | POA: Diagnosis not present

## 2017-07-06 DIAGNOSIS — I10 Essential (primary) hypertension: Secondary | ICD-10-CM | POA: Diagnosis not present

## 2017-07-06 DIAGNOSIS — M859 Disorder of bone density and structure, unspecified: Secondary | ICD-10-CM | POA: Diagnosis not present

## 2017-07-06 DIAGNOSIS — I69351 Hemiplegia and hemiparesis following cerebral infarction affecting right dominant side: Secondary | ICD-10-CM | POA: Diagnosis not present

## 2017-07-06 DIAGNOSIS — Z6839 Body mass index (BMI) 39.0-39.9, adult: Secondary | ICD-10-CM | POA: Diagnosis not present

## 2017-07-06 DIAGNOSIS — Z8781 Personal history of (healed) traumatic fracture: Secondary | ICD-10-CM | POA: Diagnosis not present

## 2017-07-06 DIAGNOSIS — K219 Gastro-esophageal reflux disease without esophagitis: Secondary | ICD-10-CM | POA: Diagnosis not present

## 2017-07-08 ENCOUNTER — Inpatient Hospital Stay (HOSPITAL_COMMUNITY)
Admission: EM | Admit: 2017-07-08 | Discharge: 2017-07-14 | DRG: 291 | Disposition: A | Discharge status: Home Health | Payer: Medicare HMO | Attending: Internal Medicine | Admitting: Internal Medicine

## 2017-07-08 ENCOUNTER — Emergency Department (HOSPITAL_COMMUNITY): Payer: Medicare HMO

## 2017-07-08 ENCOUNTER — Encounter (HOSPITAL_COMMUNITY): Payer: Self-pay | Admitting: Cardiology

## 2017-07-08 ENCOUNTER — Other Ambulatory Visit: Payer: Self-pay

## 2017-07-08 DIAGNOSIS — R0603 Acute respiratory distress: Secondary | ICD-10-CM | POA: Diagnosis present

## 2017-07-08 DIAGNOSIS — E1121 Type 2 diabetes mellitus with diabetic nephropathy: Secondary | ICD-10-CM

## 2017-07-08 DIAGNOSIS — J9601 Acute respiratory failure with hypoxia: Secondary | ICD-10-CM | POA: Diagnosis present

## 2017-07-08 DIAGNOSIS — E1165 Type 2 diabetes mellitus with hyperglycemia: Secondary | ICD-10-CM | POA: Diagnosis not present

## 2017-07-08 DIAGNOSIS — T501X5A Adverse effect of loop [high-ceiling] diuretics, initial encounter: Secondary | ICD-10-CM | POA: Diagnosis not present

## 2017-07-08 DIAGNOSIS — Z8673 Personal history of transient ischemic attack (TIA), and cerebral infarction without residual deficits: Secondary | ICD-10-CM | POA: Diagnosis not present

## 2017-07-08 DIAGNOSIS — I5031 Acute diastolic (congestive) heart failure: Secondary | ICD-10-CM | POA: Diagnosis present

## 2017-07-08 DIAGNOSIS — I447 Left bundle-branch block, unspecified: Secondary | ICD-10-CM | POA: Diagnosis present

## 2017-07-08 DIAGNOSIS — G8190 Hemiplegia, unspecified affecting unspecified side: Secondary | ICD-10-CM | POA: Diagnosis not present

## 2017-07-08 DIAGNOSIS — M859 Disorder of bone density and structure, unspecified: Secondary | ICD-10-CM | POA: Diagnosis not present

## 2017-07-08 DIAGNOSIS — M545 Low back pain: Secondary | ICD-10-CM | POA: Diagnosis not present

## 2017-07-08 DIAGNOSIS — K219 Gastro-esophageal reflux disease without esophagitis: Secondary | ICD-10-CM | POA: Diagnosis present

## 2017-07-08 DIAGNOSIS — N183 Chronic kidney disease, stage 3 unspecified: Secondary | ICD-10-CM | POA: Diagnosis present

## 2017-07-08 DIAGNOSIS — I5020 Unspecified systolic (congestive) heart failure: Secondary | ICD-10-CM | POA: Diagnosis not present

## 2017-07-08 DIAGNOSIS — Z8781 Personal history of (healed) traumatic fracture: Secondary | ICD-10-CM | POA: Diagnosis not present

## 2017-07-08 DIAGNOSIS — E1122 Type 2 diabetes mellitus with diabetic chronic kidney disease: Secondary | ICD-10-CM | POA: Diagnosis present

## 2017-07-08 DIAGNOSIS — R0602 Shortness of breath: Secondary | ICD-10-CM | POA: Diagnosis not present

## 2017-07-08 DIAGNOSIS — I5033 Acute on chronic diastolic (congestive) heart failure: Secondary | ICD-10-CM | POA: Diagnosis present

## 2017-07-08 DIAGNOSIS — M6281 Muscle weakness (generalized): Secondary | ICD-10-CM | POA: Diagnosis present

## 2017-07-08 DIAGNOSIS — R069 Unspecified abnormalities of breathing: Secondary | ICD-10-CM | POA: Diagnosis not present

## 2017-07-08 DIAGNOSIS — J9600 Acute respiratory failure, unspecified whether with hypoxia or hypercapnia: Secondary | ICD-10-CM

## 2017-07-08 DIAGNOSIS — Z794 Long term (current) use of insulin: Secondary | ICD-10-CM

## 2017-07-08 DIAGNOSIS — I110 Hypertensive heart disease with heart failure: Secondary | ICD-10-CM | POA: Diagnosis not present

## 2017-07-08 DIAGNOSIS — Z6839 Body mass index (BMI) 39.0-39.9, adult: Secondary | ICD-10-CM | POA: Diagnosis not present

## 2017-07-08 DIAGNOSIS — I872 Venous insufficiency (chronic) (peripheral): Secondary | ICD-10-CM | POA: Diagnosis not present

## 2017-07-08 DIAGNOSIS — E1142 Type 2 diabetes mellitus with diabetic polyneuropathy: Secondary | ICD-10-CM | POA: Diagnosis not present

## 2017-07-08 DIAGNOSIS — I130 Hypertensive heart and chronic kidney disease with heart failure and stage 1 through stage 4 chronic kidney disease, or unspecified chronic kidney disease: Principal | ICD-10-CM | POA: Diagnosis present

## 2017-07-08 DIAGNOSIS — IMO0002 Reserved for concepts with insufficient information to code with codable children: Secondary | ICD-10-CM

## 2017-07-08 DIAGNOSIS — I69351 Hemiplegia and hemiparesis following cerebral infarction affecting right dominant side: Secondary | ICD-10-CM | POA: Diagnosis not present

## 2017-07-08 DIAGNOSIS — I6350 Cerebral infarction due to unspecified occlusion or stenosis of unspecified cerebral artery: Secondary | ICD-10-CM | POA: Diagnosis not present

## 2017-07-08 DIAGNOSIS — I10 Essential (primary) hypertension: Secondary | ICD-10-CM | POA: Diagnosis not present

## 2017-07-08 DIAGNOSIS — I679 Cerebrovascular disease, unspecified: Secondary | ICD-10-CM | POA: Diagnosis not present

## 2017-07-08 LAB — CBC WITH DIFFERENTIAL/PLATELET
Basophils Absolute: 0 10*3/uL (ref 0.0–0.1)
Basophils Relative: 0 %
Eosinophils Absolute: 0.2 10*3/uL (ref 0.0–0.7)
Eosinophils Relative: 1 %
HEMATOCRIT: 35.6 % — AB (ref 36.0–46.0)
HEMOGLOBIN: 10.9 g/dL — AB (ref 12.0–15.0)
LYMPHS ABS: 3.4 10*3/uL (ref 0.7–4.0)
Lymphocytes Relative: 27 %
MCH: 27.8 pg (ref 26.0–34.0)
MCHC: 30.6 g/dL (ref 30.0–36.0)
MCV: 90.8 fL (ref 78.0–100.0)
MONO ABS: 0.7 10*3/uL (ref 0.1–1.0)
Monocytes Relative: 5 %
NEUTROS ABS: 8.5 10*3/uL — AB (ref 1.7–7.7)
NEUTROS PCT: 67 %
Platelets: 275 10*3/uL (ref 150–400)
RBC: 3.92 MIL/uL (ref 3.87–5.11)
RDW: 16 % — AB (ref 11.5–15.5)
WBC: 12.7 10*3/uL — ABNORMAL HIGH (ref 4.0–10.5)

## 2017-07-08 LAB — COMPREHENSIVE METABOLIC PANEL
ALK PHOS: 58 U/L (ref 38–126)
ALT: 14 U/L (ref 14–54)
AST: 15 U/L (ref 15–41)
Albumin: 3.4 g/dL — ABNORMAL LOW (ref 3.5–5.0)
Anion gap: 10 (ref 5–15)
BILIRUBIN TOTAL: 0.4 mg/dL (ref 0.3–1.2)
BUN: 28 mg/dL — ABNORMAL HIGH (ref 6–20)
CALCIUM: 8.6 mg/dL — AB (ref 8.9–10.3)
CO2: 20 mmol/L — ABNORMAL LOW (ref 22–32)
Chloride: 109 mmol/L (ref 101–111)
Creatinine, Ser: 1.52 mg/dL — ABNORMAL HIGH (ref 0.44–1.00)
GFR, EST AFRICAN AMERICAN: 36 mL/min — AB (ref >60)
GFR, EST NON AFRICAN AMERICAN: 31 mL/min — AB (ref >60)
Glucose, Bld: 201 mg/dL — ABNORMAL HIGH (ref 65–99)
POTASSIUM: 4.5 mmol/L (ref 3.5–5.1)
Sodium: 139 mmol/L (ref 135–145)
TOTAL PROTEIN: 7.9 g/dL (ref 6.5–8.1)

## 2017-07-08 LAB — TROPONIN I

## 2017-07-08 LAB — PROCALCITONIN

## 2017-07-08 LAB — GLUCOSE, CAPILLARY: Glucose-Capillary: 303 mg/dL — ABNORMAL HIGH (ref 65–99)

## 2017-07-08 LAB — BRAIN NATRIURETIC PEPTIDE: B NATRIURETIC PEPTIDE 5: 149 pg/mL — AB (ref 0.0–100.0)

## 2017-07-08 MED ORDER — INSULIN ASPART 100 UNIT/ML ~~LOC~~ SOLN
0.0000 [IU] | Freq: Every day | SUBCUTANEOUS | Status: DC
  Administered 2017-07-08: 4 [IU] via SUBCUTANEOUS
  Administered 2017-07-09: 5 [IU] via SUBCUTANEOUS
  Administered 2017-07-12: 2 [IU] via SUBCUTANEOUS

## 2017-07-08 MED ORDER — GABAPENTIN 300 MG PO CAPS
300.0000 mg | ORAL_CAPSULE | Freq: Two times a day (BID) | ORAL | Status: DC
  Administered 2017-07-08 – 2017-07-14 (×12): 300 mg via ORAL
  Filled 2017-07-08 (×13): qty 1

## 2017-07-08 MED ORDER — ENOXAPARIN SODIUM 40 MG/0.4ML ~~LOC~~ SOLN
40.0000 mg | SUBCUTANEOUS | Status: DC
  Administered 2017-07-08 – 2017-07-13 (×6): 40 mg via SUBCUTANEOUS
  Filled 2017-07-08 (×7): qty 0.4

## 2017-07-08 MED ORDER — POTASSIUM CHLORIDE CRYS ER 20 MEQ PO TBCR
20.0000 meq | EXTENDED_RELEASE_TABLET | Freq: Every day | ORAL | Status: DC
  Administered 2017-07-08: 20 meq via ORAL
  Filled 2017-07-08: qty 1

## 2017-07-08 MED ORDER — FUROSEMIDE 10 MG/ML IJ SOLN
40.0000 mg | Freq: Two times a day (BID) | INTRAMUSCULAR | Status: DC
  Administered 2017-07-09 – 2017-07-13 (×9): 40 mg via INTRAVENOUS
  Filled 2017-07-08 (×9): qty 4

## 2017-07-08 MED ORDER — INSULIN DETEMIR 100 UNIT/ML ~~LOC~~ SOLN
40.0000 [IU] | Freq: Every day | SUBCUTANEOUS | Status: DC
  Administered 2017-07-08: 40 [IU] via SUBCUTANEOUS
  Filled 2017-07-08 (×4): qty 0.4

## 2017-07-08 MED ORDER — FUROSEMIDE 10 MG/ML IJ SOLN
40.0000 mg | Freq: Once | INTRAMUSCULAR | Status: AC
  Administered 2017-07-08: 40 mg via INTRAVENOUS
  Filled 2017-07-08: qty 4

## 2017-07-08 MED ORDER — LUBIPROSTONE 8 MCG PO CAPS
8.0000 ug | ORAL_CAPSULE | Freq: Two times a day (BID) | ORAL | Status: DC
  Administered 2017-07-10 – 2017-07-14 (×5): 8 ug via ORAL
  Filled 2017-07-08 (×15): qty 1

## 2017-07-08 MED ORDER — SIMVASTATIN 20 MG PO TABS
20.0000 mg | ORAL_TABLET | Freq: Every day | ORAL | Status: DC
  Administered 2017-07-08 – 2017-07-14 (×7): 20 mg via ORAL
  Filled 2017-07-08 (×7): qty 1

## 2017-07-08 MED ORDER — PANTOPRAZOLE SODIUM 40 MG PO TBEC
40.0000 mg | DELAYED_RELEASE_TABLET | Freq: Every day | ORAL | Status: DC
  Administered 2017-07-08 – 2017-07-14 (×7): 40 mg via ORAL
  Filled 2017-07-08 (×7): qty 1

## 2017-07-08 MED ORDER — ACETAMINOPHEN 325 MG PO TABS
650.0000 mg | ORAL_TABLET | ORAL | Status: DC | PRN
  Administered 2017-07-09 – 2017-07-13 (×3): 650 mg via ORAL
  Filled 2017-07-08 (×3): qty 2

## 2017-07-08 MED ORDER — SODIUM CHLORIDE 0.9% FLUSH
3.0000 mL | INTRAVENOUS | Status: DC | PRN
  Administered 2017-07-11: 3 mL via INTRAVENOUS
  Filled 2017-07-08: qty 3

## 2017-07-08 MED ORDER — ORAL CARE MOUTH RINSE
15.0000 mL | Freq: Two times a day (BID) | OROMUCOSAL | Status: DC
  Administered 2017-07-09 – 2017-07-14 (×8): 15 mL via OROMUCOSAL

## 2017-07-08 MED ORDER — ONDANSETRON HCL 4 MG/2ML IJ SOLN: 4.0000 mg | Freq: Four times a day (QID) | INTRAMUSCULAR | Status: DC | PRN

## 2017-07-08 MED ORDER — FUROSEMIDE 10 MG/ML IJ SOLN
40.0000 mg | Freq: Once | INTRAMUSCULAR | Status: AC
  Administered 2017-07-08: 40 mg via INTRAVENOUS

## 2017-07-08 MED ORDER — HYDRALAZINE HCL 25 MG PO TABS
25.0000 mg | ORAL_TABLET | Freq: Two times a day (BID) | ORAL | Status: DC
  Administered 2017-07-08 – 2017-07-14 (×11): 25 mg via ORAL
  Filled 2017-07-08 (×12): qty 1

## 2017-07-08 MED ORDER — INSULIN ASPART 100 UNIT/ML ~~LOC~~ SOLN
0.0000 [IU] | Freq: Three times a day (TID) | SUBCUTANEOUS | Status: DC
  Administered 2017-07-09: 8 [IU] via SUBCUTANEOUS
  Administered 2017-07-09: 11 [IU] via SUBCUTANEOUS
  Administered 2017-07-09 – 2017-07-10 (×2): 15 [IU] via SUBCUTANEOUS
  Administered 2017-07-11: 3 [IU] via SUBCUTANEOUS
  Administered 2017-07-11: 2 [IU] via SUBCUTANEOUS
  Administered 2017-07-11 – 2017-07-12 (×3): 3 [IU] via SUBCUTANEOUS
  Administered 2017-07-13: 5 [IU] via SUBCUTANEOUS
  Administered 2017-07-13: 2 [IU] via SUBCUTANEOUS
  Administered 2017-07-13 – 2017-07-14 (×2): 3 [IU] via SUBCUTANEOUS
  Administered 2017-07-14: 5 [IU] via SUBCUTANEOUS

## 2017-07-08 MED ORDER — SODIUM CHLORIDE 0.9 % IV SOLN: 250.0000 mL | INTRAVENOUS | Status: DC | PRN

## 2017-07-08 MED ORDER — SODIUM CHLORIDE 0.9% FLUSH
3.0000 mL | Freq: Two times a day (BID) | INTRAVENOUS | Status: DC
  Administered 2017-07-08 – 2017-07-14 (×10): 3 mL via INTRAVENOUS

## 2017-07-08 NOTE — ED Notes (Signed)
Pt has put out 500 cc urine.  Decreased distress.   Removed bi-pap.  Pt on [place] oxygen 3 liters.  Pt no respiratory distress off bi-pap

## 2017-07-08 NOTE — ED Notes (Signed)
Pt talking on the telephone  No distress 

## 2017-07-08 NOTE — H&P (Signed)
History and Physical  [name] SEG:[id number] DOB: 01-11-1935 DOA: 07/08/2017   PCP: [name], MD   Patient coming from: Home  Chief Complaint: dyspnea  HPI:  [name] is a 83 y.o. female with medical history of hypertension, diabetes mellitus, stroke presented with 60-month history of shortness of breath that has significantly worsened over the past week.  The patient also notes increasing leg edema and increasing abdominal girth during the same period of time with acute worsening in the past week.  She stated that her dyspnea initially started as dyspnea on exertion, but it has progressed to the point where she is having dyspnea at rest.  She denied any fevers, chills, chest pain, nausea, vomiting, diarrhea, abdominal pain, dysuria.  She states that she saw her primary care provider, Dr. Legrand Rams, on 06/23/17.  At that time, the patient was restarted on 1 of her antihypertensive medications that she had not taken for 2 months.  In addition, the patient was also started on furosemide once daily although she could not remember the dose.  In the emergency department, the patient was afebrile and hemodynamically stable.  The patient was initially placed on BiPAP secondary to her dyspnea.  However after IV furosemide, her respiratory status improved, and she was weaned to 3 L.  BMP and LFTs were essentially unremarkable.  Her serum creatinine was at her baseline.  WBC was 12.7 with hemoglobin 10.9.  BNP was 149.  Chest x-ray showed interstitial edema.  There is suggestion of a right middle lobe focal opacity concerning for edema versus PNA.  Assessment/Plan: Acute diastolic CHF -7/37/1062 Echo EF 55-65% -Start furosemide 40 mg IV twice daily -Daily weights -Echocardiogram  Acute respiratory failure with hypoxia -Secondary to CHF -presently stable on 3 L -Wean oxygen for saturation greater than 90%  CKD stage III -Baseline creatinine 1.5-1.9 -Monitor with diuresis  Diabetes  mellitus type 2 with nephropathy  -Check hemoglobin A1c -Start half home dose of Levemir -NovoLog sliding scale  Essential hypertension -Continue home dose of hydralazine  Leukocytosis -Likely stress demargination -Remain off antibiotics for now and monitor clinically -Patient is afebrile hemodynamically stable -A.m. CBC      Past Medical History:  Diagnosis Date  . Arthritis   . CVA (cerebral infarction)   . Diabetes mellitus without complication ([place])   . GERD (gastroesophageal reflux disease)   . Hypertension   . Stroke [facility])    Past Surgical History:  Procedure Laterality Date  . BACK SURGERY    . CATARACT EXTRACTION W/PHACO Left 02/28/2013   Procedure: CATARACT EXTRACTION PHACO AND INTRAOCULAR LENS PLACEMENT (IOL) CDE=15.60;  Surgeon: Elta Guadeloupe T. Gershon Crane, MD;  Location: [facility];  Service: Ophthalmology;  Laterality: Left;  . CHOLECYSTECTOMY    . COLONOSCOPY  12/12/2003   RMR: Normal rectum.  Long capacious tortuous colon, colonic mucosa appeared normal  . COLONOSCOPY N/A 03/21/2014   Dr. [name]: internal and external hemorrhoids, torturous colon, colonic diverticulosis   . ESOPHAGOGASTRODUODENOSCOPY  12/28/2001   IRS:WNIOE sliding hiatal hernia/. Three small bulbar ulcers, two with stigmata of bleeding and these were coagulated using heater probe unit   . ESOPHAGOGASTRODUODENOSCOPY N/A 03/21/2014   Dr. [name]: cervical esophageal web s/p dilation, negative H.pylori   . EYE SURGERY    . HIP FRACTURE SURGERY  2008  . JOINT REPLACEMENT     Rt hip, ????? sounds like just for fracture  . MALONEY DILATION N/A 03/21/2014   Procedure: [name] DILATION;  Surgeon: Herbie Baltimore  Hilton Cork, MD;  Location: [facility];  Service: Endoscopy;  Laterality: N/A;  . SAVORY DILATION N/A 03/21/2014   Procedure: SAVORY DILATION;  Surgeon: [name], MD;  Location: [facility] END[facility] SU[facility];  Service: Endoscopy;  Laterality: N/A;   Social History:  reports that  has never smoked. she has never used smokeless  tobacco. She reports that she does not drink alcohol or use drugs.   Family History  Problem Relation Age of Onset  . Crohn's disease Daughter   . Colon cancer Neg Hx      No Known Allergies   Prior to Admission medications   Medication Sig Start Date End Date Taking? Authorizing Provider  gabapentin (NEURONTIN) 300 MG capsule Take 300 mg by mouth 2 (two) times daily.  02/22/14  Yes [provider]  hydrALAZINE (APRESOLINE) 25 MG tablet Take 25 mg by mouth 2 (two) times daily. 07/20/16  Yes [provider]  hydrocortisone (ANUSOL-HC) 2.5 % rectal cream Place 1 application rectally 2 (two) times daily. Patient taking differently: Place 1 application rectally as needed.  06/25/16  Yes Annitta Needs, NP  insulin detemir (LEVEMIR) 100 UNIT/ML injection Inject 80 Units into the skin at bedtime.    Yes [provider]  insulin lispro (HUMALOG) 100 UNIT/ML injection Inject 30 Units into the skin 3 (three) times daily before meals.    Yes [provider]  lubiprostone (AMITIZA) 8 MCG capsule Take 8 mcg by mouth 2 (two) times daily as needed for constipation.   Yes [provider]  pantoprazole (PROTONIX) 40 MG tablet TAKE 1 TABLET BY MOUTH ONCE DAILY. 07/06/16  Yes Annitta Needs, NP  potassium chloride SA (K-DUR,KLOR-CON) 20 MEQ tablet Take 20 mEq by mouth daily.   Yes [provider]  simvastatin (ZOCOR) 20 MG tablet Take 20 mg by mouth daily.   Yes [provider]    Review of Systems:  Constitutional:  No weight loss, night sweats, Fevers, chills, Head&Eyes: No headache.  No vision loss.  No eye pain or scotoma ENT:  No Difficulty swallowing,Tooth/dental problems,Sore throat,  No ear ache, post nasal drip,  Cardio-vascular:  No chest pain, Orthopnea, PND, swelling in lower extremities,  dizziness, palpitations  GI:  No  abdominal pain, nausea, vomiting, diarrhea, loss of appetite, hematochezia, melena, heartburn,  indigestion, Resp:   No coughing up of blood .No wheezing.No chest wall deformity  Skin:  no rash or lesions.  GU:  no dysuria, change in color of urine, no urgency or frequency. No flank pain.  Musculoskeletal:  No joint pain or swelling. No decreased range of motion. No back pain.  Psych:  No change in mood or affect. No depression or anxiety. Neurologic: No headache, no dysesthesia, no focal weakness, no vision loss. No syncope  Physical Exam: Vitals:   07/08/17 1841 07/08/17 1900 07/08/17 1930 07/08/17 2030  BP: (!) 146/51 (!) 157/72 (!) 156/70 (!) 149/66  Pulse: 85 80 90 86  Resp: 18 (!) 29 15 (!) 22  Temp:      TempSrc:      SpO2: 98% 99% 98% 94%  Weight:      Height:       General:  A&O x 3, NAD, nontoxic, pleasant/cooperative Head/Eye: No conjunctival hemorrhage, no icterus, [place]/AT, No nystagmus ENT:  No icterus,  No thrush, good dentition, no pharyngeal exudate Neck:  No masses, no lymphadenpathy, no bruits CV:  RRR, no rub, no gallop, no S3 Lung: Bibasilar crackles but no  wheezing.  Good air movement. Abdomen: soft/NT, +BS, nondistended, no peritoneal signs Ext: No cyanosis, No rashes, No petechiae, No lymphangitis, 2 + LE edema Neuro: CNII-XII intact, strength 4/5 in bilateral upper and lower extremities, no dysmetria  Labs on Admission:  Basic Metabolic Panel: Recent Labs  Lab 07/08/17 1531  NA 139  K 4.5  CL 109  CO2 20*  GLUCOSE 201*  BUN 28*  CREATININE 1.52*  CALCIUM 8.6*   Liver Function Tests: Recent Labs  Lab 07/08/17 1531  AST 15  ALT 14  ALKPHOS 58  BILITOT 0.4  PROT 7.9  ALBUMIN 3.4*   No results for input(s): LIPASE, AMYLASE in the last 168 hours. No results for input(s): AMMONIA in the last 168 hours. CBC: Recent Labs  Lab 07/08/17 1531  WBC 12.7*  NEUTROABS 8.5*  HGB 10.9*  HCT 35.6*  MCV 90.8  PLT 275   Coagulation Profile: No results for input(s): INR, PROTIME in the last 168 hours. Cardiac Enzymes: Recent Labs   Lab 07/08/17 1531  TROPONINI <0.03   BNP: Invalid input(s): POCBNP CBG: No results for input(s): GLUCAP in the last 168 hours. Urine analysis:    Component Value Date/Time   COLORURINE YELLOW 05/02/2015 1500   APPEARANCEUR HAZY (A) 05/02/2015 1500   LABSPEC 1.015 05/02/2015 1500   PHURINE 5.0 05/02/2015 1500   GLUCOSEU NEGATIVE 05/02/2015 1500   HGBUR NEGATIVE 05/02/2015 1500   BILIRUBINUR NEGATIVE 05/02/2015 1500   KETONESUR NEGATIVE 05/02/2015 1500   PROTEINUR NEGATIVE 05/02/2015 1500   UROBILINOGEN 0.2 02/20/2015 1200   NITRITE NEGATIVE 05/02/2015 1500   LEUKOCYTESUR NEGATIVE 05/02/2015 1500   Sepsis Labs: @LABRCNTIP (procalcitonin:4,lacticidven:4) )No results found for this or any previous visit (from the past 240 hour(s)).   Radiological Exams on Admission: Dg Chest Portable 1 View  Result Date: 07/08/2017 CLINICAL DATA:  Shortness of Breath EXAM: PORTABLE CHEST 1 VIEW COMPARISON:  May 02, 2015 FINDINGS: There is cardiomegaly with pulmonary venous hypertension. There is interstitial pulmonary edema with small pleural effusion on the right. There is a focus of airspace opacity in the right mid lung consistent with either alveolar edema or small focus of pneumonia. IMPRESSION: Findings felt to be indicative of congestive heart failure with interstitial edema and pulmonary vascular congestion. Small right pleural effusion. Focal area of airspace opacity in the right mid lung. Question focus of alveolar edema versus small focus of superimposed pneumonia. Electronically Signed   By: Lowella Grip III M.D.   On: 07/08/2017 15:47    EKG: Independently reviewed. Sinus, LBBB    Time spent:60 minutes Code Status:   FULL Family Communication:  Daughters updated at bedside Disposition Plan: expect 2-3 day hospitalization Consults called: none DVT Prophylaxis: [place] Lovenox  Orson Eva, DO  [facility] Pager [phone]  If 7PM-7AM, please contact  night-coverage [email or web address] Password [facility] 07/08/2017, 9:02 PM

## 2017-07-08 NOTE — ED Notes (Signed)
Emptied urine container from wick.  1200cc urine.  Pt talking with family.  No distress.

## 2017-07-08 NOTE — ED Notes (Signed)
Pt on 3 liters oxygen.  Pt working to breathe.  Placing back on bi -pap.

## 2017-07-08 NOTE — ED Notes (Signed)
Pt remains on bi-pap.

## 2017-07-08 NOTE — ED Notes (Signed)
Pt continues on bi-pap.  Tolerating well.  Pt decreased distress at this time.

## 2017-07-08 NOTE — ED Triage Notes (Addendum)
SOB times 2 weeks.  Worse today.   EMS gave albuteral treatment and IV solumedrol 125 mg . Pt on Bi-Pap per EMS.

## 2017-07-08 NOTE — ED Provider Notes (Signed)
[facility] EMERGENCY DEPARTMENT Provider Note   CSN: [id number] Arrival date & time: 07/08/17  1518     History   Chief Complaint Chief Complaint  Patient presents with  . Respiratory Distress    HPI [name] is a 83 y.o. female.  Patient states that for the last couple days she has been getting more more short of breath.  Significant edema in both her legs.  She was brought in by paramedics on BiPAP   The history is provided by the patient.  Shortness of Breath  This is a new problem. The problem occurs continuously.The current episode started 2 days ago. The problem has not changed since onset.Pertinent negatives include no fever, no headaches, no cough, no chest pain, no abdominal pain and no rash.    Past Medical History:  Diagnosis Date  . Arthritis   . CVA (cerebral infarction)   . Diabetes mellitus without complication ([place])   . GERD (gastroesophageal reflux disease)   . Hypertension   . Stroke [facility])     Patient Active Problem List   Diagnosis Date Noted  . Hypertension 06/23/2017  . Prolapsed internal hemorrhoids, grade 3 06/25/2016  . Abdominal pain 05/08/2015  . Dysphagia, pharyngoesophageal phase   . Other hemorrhoids   . Diverticulosis of colon without hemorrhage   . GERD (gastroesophageal reflux disease) 02/28/2014  . Constipation 02/28/2014  . Esophageal dysphagia 02/28/2014  . Rectal bleeding 02/28/2014  . OSTEOARTHRITIS, KNEE, RIGHT, SEVERE 12/13/2008  . DEGENERATIVE DISC DISEASE, LUMBOSACRAL SPINE W/RADICULOPATHY 12/13/2008  . BACK PAIN 12/13/2008  . DIABETES 04/13/2007  . FRACTURE, FEMUR, INTERTROCHANTERIC REGION 04/13/2007    Past Surgical History:  Procedure Laterality Date  . BACK SURGERY    . CATARACT EXTRACTION W/PHACO Left 02/28/2013   Procedure: CATARACT EXTRACTION PHACO AND INTRAOCULAR LENS PLACEMENT (IOL) CDE=15.60;  Surgeon: Elta Guadeloupe T. Gershon Crane, MD;  Location: [facility] ORS;  Service: Ophthalmology;  Laterality: Left;  .  CHOLECYSTECTOMY    . COLONOSCOPY  12/12/2003   RMR: Normal rectum.  Long capacious tortuous colon, colonic mucosa appeared normal  . COLONOSCOPY N/A 03/21/2014   Dr. [name]: internal and external hemorrhoids, torturous colon, colonic diverticulosis   . ESOPHAGOGASTRODUODENOSCOPY  12/28/2001   KCL:EXNTZ sliding hiatal hernia/. Three small bulbar ulcers, two with stigmata of bleeding and these were coagulated using heater probe unit   . ESOPHAGOGASTRODUODENOSCOPY N/A 03/21/2014   Dr. [name]: cervical esophageal web s/p dilation, negative H.pylori   . EYE SURGERY    . HIP FRACTURE SURGERY  2008  . JOINT REPLACEMENT     Rt hip, ????? sounds like just for fracture  . MALONEY DILATION N/A 03/21/2014   Procedure: [name] DILATION;  Surgeon: [name], MD;  Location: [facility];  Service: Endoscopy;  Laterality: N/A;  . SAVORY DILATION N/A 03/21/2014   Procedure: SAVORY DILATION;  Surgeon: [name], MD;  Location: [facility] SU[facility];  Service: Endoscopy;  Laterality: N/A;    OB History    No data available       Home Medications    Prior to Admission medications   Medication Sig Start Date End Date Taking? Authorizing Provider  gabapentin (NEURONTIN) 300 MG capsule Take 300 mg by mouth 2 (two) times daily.  02/22/14  Yes [provider]  hydrALAZINE (APRESOLINE) 25 MG tablet Take 25 mg by mouth 2 (two) times daily. 07/20/16  Yes [provider]  hydrocortisone (ANUSOL-HC) 2.5 % rectal cream Place 1 application rectally 2 (two) times daily. Patient  taking differently: Place 1 application rectally as needed.  06/25/16  Yes Annitta Needs, NP  insulin detemir (LEVEMIR) 100 UNIT/ML injection Inject 80 Units into the skin at bedtime.    Yes [provider]  insulin lispro (HUMALOG) 100 UNIT/ML injection Inject 30 Units into the skin 3 (three) times daily before meals.    Yes [provider]  lubiprostone (AMITIZA) 8 MCG capsule Take 8 mcg by mouth 2 (two) times  daily as needed for constipation.   Yes [provider]  pantoprazole (PROTONIX) 40 MG tablet TAKE 1 TABLET BY MOUTH ONCE DAILY. 07/06/16  Yes Annitta Needs, NP  potassium chloride SA (K-DUR,KLOR-CON) 20 MEQ tablet Take 20 mEq by mouth daily.   Yes [provider]  simvastatin (ZOCOR) 20 MG tablet Take 20 mg by mouth daily.   Yes [provider]    Family History Family History  Problem Relation Age of Onset  . Crohn's disease Daughter   . Colon cancer Neg Hx     Social History Social History   Tobacco Use  . Smoking status: Never Smoker  . Smokeless tobacco: Never Used  Substance Use Topics  . Alcohol use: No  . Drug use: No     Allergies   Patient has no known allergies.   Review of Systems Review of Systems  Constitutional: Negative for appetite change, fatigue and fever.  HENT: Negative for congestion, ear discharge and sinus pressure.   Eyes: Negative for discharge.  Respiratory: Positive for shortness of breath. Negative for cough.   Cardiovascular: Negative for chest pain.  Gastrointestinal: Negative for abdominal pain and diarrhea.  Genitourinary: Negative for frequency and hematuria.  Musculoskeletal: Negative for back pain.  Skin: Negative for rash.  Neurological: Negative for seizures and headaches.  Psychiatric/Behavioral: Negative for hallucinations.     Physical Exam Updated Vital Signs BP (!) 149/53   Pulse 81   Temp 98.1 F (36.7 C) (Oral)   Resp (!) 26   Ht 5\' 6"  (1.676 m)   Wt 108 kg (238 lb)   SpO2 98%   BMI 38.41 kg/m   Physical Exam  Constitutional: She is oriented to person, place, and time. She appears well-developed.  HENT:  Head: Normocephalic.  Eyes: Conjunctivae and EOM are normal. No scleral icterus.  Neck: Neck supple. No thyromegaly present.  Cardiovascular: Regular rhythm. Exam reveals no gallop and no friction rub.  No murmur heard. Tachycardia  Pulmonary/Chest: No stridor. She is in  respiratory distress. She has wheezes. She has no rales. She exhibits no tenderness.  Abdominal: She exhibits no distension. There is no tenderness. There is no rebound.  Musculoskeletal: Normal range of motion. She exhibits edema.  3+ edema all the way up into her thighs on both sides  Lymphadenopathy:    She has no cervical adenopathy.  Neurological: She is oriented to person, place, and time. She exhibits normal muscle tone. Coordination normal.  Skin: No rash noted. No erythema.  Psychiatric: She has a normal mood and affect. Her behavior is normal.     ED Treatments / Results  Labs (all labs ordered are listed, but only abnormal results are displayed) Labs Reviewed  CBC WITH DIFFERENTIAL/PLATELET - Abnormal; Notable for the following components:      Result Value   WBC 12.7 (*)    Hemoglobin 10.9 (*)    HCT 35.6 (*)    RDW 16.0 (*)    Neutro Abs 8.5 (*)    All other components  within normal limits  COMPREHENSIVE METABOLIC PANEL - Abnormal; Notable for the following components:   CO2 20 (*)    Glucose, Bld 201 (*)    BUN 28 (*)    Creatinine, Ser 1.52 (*)    Calcium 8.6 (*)    Albumin 3.4 (*)    GFR calc non Af Amer 31 (*)    GFR calc Af Amer 36 (*)    All other components within normal limits  BRAIN NATRIURETIC PEPTIDE - Abnormal; Notable for the following components:   B Natriuretic Peptide 149.0 (*)    All other components within normal limits  TROPONIN I    EKG  EKG Interpretation  Date/Time:  Thursday July 08 2017 15:29:00 EST Ventricular Rate:  92 PR Interval:    QRS Duration: 130 QT Interval:  413 QTC Calculation: 514 R Axis:   -42 Text Interpretation:  Sinus rhythm Nonspecific IVCD with LAD Anterior infarct, old Nonspecific T abnormalities, lateral leads Artifact in lead(s) II III aVR aVL aVF V5 and baseline wander in lead(s) V6 Confirmed by Milton Ferguson [phone]) on 07/08/2017 3:33:16 PM Also confirmed by Milton Ferguson [phone])  on 07/08/2017 6:17:03  PM       Radiology Dg Chest Portable 1 View  Result Date: 07/08/2017 CLINICAL DATA:  Shortness of Breath EXAM: PORTABLE CHEST 1 VIEW COMPARISON:  May 02, 2015 FINDINGS: There is cardiomegaly with pulmonary venous hypertension. There is interstitial pulmonary edema with small pleural effusion on the right. There is a focus of airspace opacity in the right mid lung consistent with either alveolar edema or small focus of pneumonia. IMPRESSION: Findings felt to be indicative of congestive heart failure with interstitial edema and pulmonary vascular congestion. Small right pleural effusion. Focal area of airspace opacity in the right mid lung. Question focus of alveolar edema versus small focus of superimposed pneumonia. Electronically Signed   By: Lowella Grip III M.D.   On: 07/08/2017 15:47    Procedures Procedures (including critical care time)  Medications Ordered in ED Medications  furosemide (LASIX) injection 40 mg (40 mg Intravenous Given 07/08/17 1542)  furosemide (LASIX) injection 40 mg (40 mg Intravenous Given 07/08/17 1705)     Initial Impression / Assessment and Plan / ED Course  I have reviewed the triage vital signs and the nursing notes.  Pertinent labs & imaging results that were available during my care of the patient were reviewed by me and considered in my medical decision making (see chart for details).     CRITICAL CARE Performed by: Milton Ferguson Total critical care time: 45 minutes Critical care time was exclusive of separately billable procedures and treating other patients. Critical care was necessary to treat or prevent imminent or life-threatening deterioration. Critical care was time spent personally by me on the following activities: development of treatment plan with patient and/or surrogate as well as nursing, discussions with consultants, evaluation of patient's response to treatment, examination of patient, obtaining history from patient or  surrogate, ordering and performing treatments and interventions, ordering and review of laboratory studies, ordering and review of radiographic studies, pulse oximetry and re-evaluation of patient's condition.  Patient was in severe congestive heart failure.  Patient responded well to BiPAP and IV Lasix.  She is being admitted to medicine Final Clinical Impressions(s) / ED Diagnoses   Final diagnoses:  Systolic congestive heart failure, unspecified HF chronicity (HCC)  Acute respiratory failure, unspecified whether with hypoxia or hypercapnia [facility])    ED Discharge Orders    None  Milton Ferguson, MD 07/08/17 [phone]

## 2017-07-09 ENCOUNTER — Inpatient Hospital Stay (HOSPITAL_COMMUNITY): Payer: Medicare HMO

## 2017-07-09 DIAGNOSIS — I5020 Unspecified systolic (congestive) heart failure: Secondary | ICD-10-CM

## 2017-07-09 DIAGNOSIS — I10 Essential (primary) hypertension: Secondary | ICD-10-CM

## 2017-07-09 LAB — BLOOD GAS, ARTERIAL
ACID-BASE DEFICIT: 0.2 mmol/L (ref 0.0–2.0)
Bicarbonate: 24.2 mmol/L (ref 20.0–28.0)
DRAWN BY: 221791
O2 Content: 2 L/min
O2 SAT: 95.3 %
pCO2 arterial: 40.5 mmHg (ref 32.0–48.0)
pH, Arterial: 7.391 (ref 7.350–7.450)
pO2, Arterial: 79.7 mmHg — ABNORMAL LOW (ref 83.0–108.0)

## 2017-07-09 LAB — CBC
HCT: 32.8 % — ABNORMAL LOW (ref 36.0–46.0)
HEMOGLOBIN: 10 g/dL — AB (ref 12.0–15.0)
MCH: 27.5 pg (ref 26.0–34.0)
MCHC: 30.5 g/dL (ref 30.0–36.0)
MCV: 90.4 fL (ref 78.0–100.0)
Platelets: 230 10*3/uL (ref 150–400)
RBC: 3.63 MIL/uL — ABNORMAL LOW (ref 3.87–5.11)
RDW: 15.8 % — ABNORMAL HIGH (ref 11.5–15.5)
WBC: 7.6 10*3/uL (ref 4.0–10.5)

## 2017-07-09 LAB — GLUCOSE, CAPILLARY
GLUCOSE-CAPILLARY: 283 mg/dL — AB (ref 65–99)
GLUCOSE-CAPILLARY: 341 mg/dL — AB (ref 65–99)
Glucose-Capillary: 354 mg/dL — ABNORMAL HIGH (ref 65–99)
Glucose-Capillary: 386 mg/dL — ABNORMAL HIGH (ref 65–99)

## 2017-07-09 LAB — BASIC METABOLIC PANEL
ANION GAP: 9 (ref 5–15)
BUN: 31 mg/dL — ABNORMAL HIGH (ref 6–20)
CALCIUM: 8.6 mg/dL — AB (ref 8.9–10.3)
CHLORIDE: 107 mmol/L (ref 101–111)
CO2: 22 mmol/L (ref 22–32)
CREATININE: 1.63 mg/dL — AB (ref 0.44–1.00)
GFR calc non Af Amer: 28 mL/min — ABNORMAL LOW (ref >60)
GFR, EST AFRICAN AMERICAN: 33 mL/min — AB (ref >60)
Glucose, Bld: 338 mg/dL — ABNORMAL HIGH (ref 65–99)
Potassium: 5.2 mmol/L — ABNORMAL HIGH (ref 3.5–5.1)
SODIUM: 138 mmol/L (ref 135–145)

## 2017-07-09 LAB — ECHOCARDIOGRAM COMPLETE
HEIGHTINCHES: 66 in
WEIGHTICAEL: 3788.38 [oz_av]

## 2017-07-09 LAB — TROPONIN I
Troponin I: <0.03 ng/mL (ref <0.03)
Troponin I: <0.03 ng/mL (ref <0.03)
Troponin I: 0.03 ng/mL (ref <0.03)

## 2017-07-09 LAB — POTASSIUM: Potassium: 5.1 mmol/L (ref 3.5–5.1)

## 2017-07-09 MED ORDER — POTASSIUM CHLORIDE CRYS ER 20 MEQ PO TBCR: 20.0000 meq | EXTENDED_RELEASE_TABLET | Freq: Every day | ORAL | Status: DC

## 2017-07-09 MED ORDER — INSULIN ASPART 100 UNIT/ML ~~LOC~~ SOLN
10.0000 [IU] | Freq: Three times a day (TID) | SUBCUTANEOUS | Status: DC
  Administered 2017-07-09 (×2): 10 [IU] via SUBCUTANEOUS

## 2017-07-09 MED ORDER — INSULIN DETEMIR 100 UNIT/ML ~~LOC~~ SOLN
80.0000 [IU] | Freq: Every day | SUBCUTANEOUS | Status: DC
  Administered 2017-07-09: 80 [IU] via SUBCUTANEOUS
  Filled 2017-07-09 (×3): qty 0.8

## 2017-07-09 MED ORDER — METHYLPREDNISOLONE SODIUM SUCC 125 MG IJ SOLR
60.0000 mg | Freq: Two times a day (BID) | INTRAMUSCULAR | Status: AC
  Administered 2017-07-10: 60 mg via INTRAVENOUS
  Filled 2017-07-09: qty 2

## 2017-07-09 MED ORDER — METHYLPREDNISOLONE SODIUM SUCC 125 MG IJ SOLR
60.0000 mg | Freq: Three times a day (TID) | INTRAMUSCULAR | Status: DC
  Administered 2017-07-09: 60 mg via INTRAVENOUS
  Filled 2017-07-09: qty 2

## 2017-07-09 MED ORDER — IPRATROPIUM-ALBUTEROL 0.5-2.5 (3) MG/3ML IN SOLN
3.0000 mL | Freq: Four times a day (QID) | RESPIRATORY_TRACT | Status: DC
  Administered 2017-07-09 – 2017-07-10 (×3): 3 mL via RESPIRATORY_TRACT
  Filled 2017-07-09 (×3): qty 3

## 2017-07-09 NOTE — Progress Notes (Signed)
PROGRESS NOTE    [name]  HBZ:[id number] DOB: 10-23-1934 DOA: 07/08/2017 PCP: [name], MD    Brief Narrative:  Patient is a 83 year old female history of hypertension, diabetes, CVA presenting with a 6-month history of worsening shortness of breath with increasing lower extremity edema, abdominal girth which has worsened significantly over the past week.  Patient noted initially to have shortness of breath on exertion that has progressed to shortness of breath at rest.  Patient seen in the ED initially had to be placed on the BiPAP secondary to her dyspnea.  Patient given IV Lasix with some improvement in respiratory status and wean down to 3 L nasal cannula.  Chest x-ray done concerning for interstitial edema as well as suggestion of a right middle lobe focal opacity concerning for edema versus pneumonia.  Patient admitted for an acute CHF exacerbation.   Assessment & Plan:   Principal Problem:   Acute respiratory failure with hypoxia (HCC) Active Problems:   Acute diastolic CHF (congestive heart failure) (HCC)   GERD (gastroesophageal reflux disease)   Hypertension   Uncontrolled type 2 diabetes mellitus with diabetic nephropathy, with long-term current use of insulin (HCC)   CKD (chronic kidney disease), stage III ([place])   Essential hypertension  #1 acute respiratory failure with hypoxia secondary to acute diastolic heart failure Patient presented with acute respiratory failure with hypoxia initially had to be placed on the BiPAP with concerns for acute diastolic CHF exacerbation.  Patient with worsening shortness of breath which started on exertion and now has progressed to shortness of breath at rest.  Patient also noted to have worsening lower extremity edema and abdominal girth.  BNP on admission was 149.  Chest x-ray concerning for interstitial edema.  Patient given a dose of IV Lasix in the ED with some improvement such that patient was weaned from BiPAP to 3 L nasal  cannula.  Cardiac enzymes negative x1.  Will cycle cardiac enzymes every 6 hours x3.  2D echo pending.  With a urine output of 1.2 L since admission.  She is noted on exam and to have poor to fair air movement, tight, expiratory wheezing.  Continue Lasix 40 mg IV every 12 hours.  Continue hydralazine.  Due to tightness poor air movement and expiratory wheezing will give a dose of IV Solu-Medrol 60 mg every 12 hours x2, place on scheduled nebs, check ABG.  Follow.  Will likely need cardiology consultation as this is new onset acute CHF per patient.  2.  Chronic Kidney disease stage III Baseline creatinine 1.5-1.9.  Monitor closely with diuresis.  Follow.  3.  Poorly controlled diabetes mellitus type 2 with nephropathy Hemoglobin A1c pending.  CBGs have ranged from 283-354.  Will increase Levemir back to his home dose of 80 units nightly.  Placed on NovoLog 10 units 3 times daily with meals.  Continue sliding scale insulin.  Follow.  4.  Hypertension Pressure stable.  Continue current dose of hydralazine.  Patient on IV Lasix.  Follow.  5.  Gastroesophageal reflux disease PPI.    DVT prophylaxis: Lovenox Code Status: Full Family Communication: Updated patient and family at bedside. Disposition Plan: Home once medically stable, clinically improved an acute CHF exacerbation is compensated   Consultants:   None  Procedures:   Chest x-ray 07/08/2017  2D echo 07/09/2017 pending  Antimicrobials:   None   Subjective: States some improvement with dyspnea since admission however still significantly short of breath and not at baseline.  Per family  improvement with lower extremity edema.   Objective: Vitals:   07/08/17 2030 07/08/17 2100 07/09/17 0500 07/09/17 0858  BP: (!) 149/66 (!) 165/56 (!) 155/61   Pulse: 86 86 85   Resp: (!) 22 20 20    Temp:  98.5 F (36.9 C) 98.3 F (36.8 C)   TempSrc:  Oral Oral   SpO2: 94% 100% 99%   Weight:    107.4 kg (236 lb 12.4 oz)  Height:         Intake/Output Summary (Last 24 hours) at 07/09/2017 1230 Last data filed at 07/09/2017 0758 Gross per 24 hour  Intake -  Output 2400 ml  Net -2400 ml   Filed Weights   07/08/17 1524 07/09/17 0858  Weight: 108 kg (238 lb) 107.4 kg (236 lb 12.4 oz)    Examination:  General exam: Appears calm and comfortable  Respiratory system: Poor to fair air movement.  Scattered crackles.  Some expiratory wheezing.  Respiratory effort normal. Cardiovascular system: S1 & S2 heard, RRR. No JVD, murmurs, rubs, gallops or clicks.  2-3+ bilateral lower extremity edema up to thighs. Gastrointestinal system: Abdomen is nondistended, soft and nontender. No organomegaly or masses felt. Normal bowel sounds heard. Central nervous system: Alert and oriented. No focal neurological deficits. Extremities: Symmetric 5 x 5 power. Skin: No rashes, lesions or ulcers Psychiatry: Judgement and insight appear normal. Mood & affect appropriate.     Data Reviewed: I have personally reviewed following labs and imaging studies  CBC: Recent Labs  Lab 07/08/17 1531 07/09/17 0529  WBC 12.7* 7.6  NEUTROABS 8.5*  --   HGB 10.9* 10.0*  HCT 35.6* 32.8*  MCV 90.8 90.4  PLT 275 453   Basic Metabolic Panel: Recent Labs  Lab 07/08/17 1531 07/09/17 0529 07/09/17 0855  NA 139 138  --   K 4.5 5.2* 5.1  CL 109 107  --   CO2 20* 22  --   GLUCOSE 201* 338*  --   BUN 28* 31*  --   CREATININE 1.52* 1.63*  --   CALCIUM 8.6* 8.6*  --    GFR: Estimated Creatinine Clearance: 33.5 mL/min (A) (by C-G formula based on SCr of 1.63 mg/dL (H)). Liver Function Tests: Recent Labs  Lab 07/08/17 1531  AST 15  ALT 14  ALKPHOS 58  BILITOT 0.4  PROT 7.9  ALBUMIN 3.4*   No results for input(s): LIPASE, AMYLASE in the last 168 hours. No results for input(s): AMMONIA in the last 168 hours. Coagulation Profile: No results for input(s): INR, PROTIME in the last 168 hours. Cardiac Enzymes: Recent Labs  Lab 07/08/17 1531  07/09/17 0855  TROPONINI <0.03 <0.03   BNP (last 3 results) No results for input(s): PROBNP in the last 8760 hours. HbA1C: No results for input(s): HGBA1C in the last 72 hours. CBG: Recent Labs  Lab 07/08/17 2144 07/09/17 0738 07/09/17 1137  GLUCAP 303* 283* 354*   Lipid Profile: No results for input(s): CHOL, HDL, LDLCALC, TRIG, CHOLHDL, LDLDIRECT in the last 72 hours. Thyroid Function Tests: No results for input(s): TSH, T4TOTAL, FREET4, T3FREE, THYROIDAB in the last 72 hours. Anemia Panel: No results for input(s): VITAMINB12, FOLATE, FERRITIN, TIBC, IRON, RETICCTPCT in the last 72 hours. Sepsis Labs: Recent Labs  Lab 07/08/17 1531  PROCALCITON <0.10    No results found for this or any previous visit (from the past 240 hour(s)).       Radiology Studies: Dg Chest Portable 1 View  Result Date: 07/08/2017 CLINICAL DATA:  Shortness of Breath EXAM: PORTABLE CHEST 1 VIEW COMPARISON:  May 02, 2015 FINDINGS: There is cardiomegaly with pulmonary venous hypertension. There is interstitial pulmonary edema with small pleural effusion on the right. There is a focus of airspace opacity in the right mid lung consistent with either alveolar edema or small focus of pneumonia. IMPRESSION: Findings felt to be indicative of congestive heart failure with interstitial edema and pulmonary vascular congestion. Small right pleural effusion. Focal area of airspace opacity in the right mid lung. Question focus of alveolar edema versus small focus of superimposed pneumonia. Electronically Signed   By: Lowella Grip III M.D.   On: 07/08/2017 15:47        Scheduled Meds: . enoxaparin (LOVENOX) injection  40 mg Subcutaneous Q24H  . furosemide  40 mg Intravenous BID  . gabapentin  300 mg Oral BID  . hydrALAZINE  25 mg Oral BID  . insulin aspart  0-15 Units Subcutaneous TID WC  . insulin aspart  0-5 Units Subcutaneous QHS  . insulin aspart  10 Units Subcutaneous TID WC  . insulin  detemir  40 Units Subcutaneous QHS  . ipratropium-albuterol  3 mL Nebulization Q6H  . lubiprostone  8 mcg Oral BID WC  . mouth rinse  15 mL Mouth Rinse BID  . methylPREDNISolone (SOLU-MEDROL) injection  60 mg Intravenous Q8H  . pantoprazole  40 mg Oral Daily  . simvastatin  20 mg Oral Daily  . sodium chloride flush  3 mL Intravenous Q12H   Continuous Infusions: . sodium chloride       LOS: 1 day    Time spent: 35 minutes    Irine Seal, MD [facility] Pager [phone]  If 7PM-7AM, please contact night-coverage [email or web address] Password [facility] 07/09/2017, 12:30 PM

## 2017-07-09 NOTE — Progress Notes (Signed)
  Echocardiogram 2D Echocardiogram has been performed.  [name] 07/09/2017, 4:31 PM

## 2017-07-09 NOTE — Care Management Note (Signed)
Case Management Note  Patient Details  Name: [name] MRN: [id number] Date of Birth: Apr 13, 1935  Subjective/Objective:  Adm with CHF. From home, grandson lives with her. Walks with RW. Has lots of family support. Has PCP-Dr. Legrand Rams, family drives her to appointments. Has insurance with prescription coverage, uses [employer]. Is active with [facility] for RN and PT. Acutely using oxygen.                   Action/Plan:DC home with resumption of HH. Would like a BSC. Vaughan Basta of [facility] notified and will obtain order from Epic and deliver to room prior to DC.     Expected Discharge Date:     07/11/2017             Expected Discharge Plan:  [place]  In-House Referral:     Discharge planning Services  CM Consult  Post Acute Care Choice:  Home Health, Resumption of Svcs/PTA Provider Choice offered to:  Patient  DME Arranged:  3-N-1 DME Agency:  [place]:    [place]:  [place]  Status of Service:  Completed, signed off  If discussed at [place] of Stay Meetings, dates discussed:    Additional Comments:  Jenisse Vullo, Chauncey Reading, RN 07/09/2017, 12:38 PM

## 2017-07-09 NOTE — Progress Notes (Signed)
CRITICAL VALUE ALERT  Critical Value:  Troponin 0.03  Date & Time Notied:  2245 07/09/17  Provider Notified: Midlevel coverage  Orders Received/Actions taken:

## 2017-07-10 LAB — BASIC METABOLIC PANEL
ANION GAP: 13 (ref 5–15)
ANION GAP: 12 (ref 5–15)
ANION GAP: 12 (ref 5–15)
Anion gap: 15 (ref 5–15)
BUN: 46 mg/dL — ABNORMAL HIGH (ref 6–20)
BUN: 48 mg/dL — ABNORMAL HIGH (ref 6–20)
BUN: 50 mg/dL — ABNORMAL HIGH (ref 6–20)
BUN: 49 mg/dL — AB (ref 6–20)
CALCIUM: 8.6 mg/dL — AB (ref 8.9–10.3)
CO2: 23 mmol/L (ref 22–32)
CO2: 22 mmol/L (ref 22–32)
CO2: 25 mmol/L (ref 22–32)
CO2: 26 mmol/L (ref 22–32)
CREATININE: 1.84 mg/dL — AB (ref 0.44–1.00)
Calcium: 8.7 mg/dL — ABNORMAL LOW (ref 8.9–10.3)
Calcium: 8.9 mg/dL (ref 8.9–10.3)
Calcium: 8.9 mg/dL (ref 8.9–10.3)
Chloride: 101 mmol/L (ref 101–111)
Chloride: 97 mmol/L — ABNORMAL LOW (ref 101–111)
Chloride: 100 mmol/L — ABNORMAL LOW (ref 101–111)
Chloride: 103 mmol/L (ref 101–111)
Creatinine, Ser: 1.71 mg/dL — ABNORMAL HIGH (ref 0.44–1.00)
Creatinine, Ser: 1.75 mg/dL — ABNORMAL HIGH (ref 0.44–1.00)
Creatinine, Ser: 1.74 mg/dL — ABNORMAL HIGH (ref 0.44–1.00)
GFR calc Af Amer: 31 mL/min — ABNORMAL LOW (ref >60)
GFR calc Af Amer: 30 mL/min — ABNORMAL LOW (ref >60)
GFR calc non Af Amer: 25 mL/min — ABNORMAL LOW (ref >60)
GFR calc non Af Amer: 26 mL/min — ABNORMAL LOW (ref >60)
GFR calc non Af Amer: 26 mL/min — ABNORMAL LOW (ref >60)
GFR, EST AFRICAN AMERICAN: 29 mL/min — AB (ref >60)
GFR, EST AFRICAN AMERICAN: 30 mL/min — AB (ref >60)
GFR, EST NON AFRICAN AMERICAN: 27 mL/min — AB (ref >60)
GLUCOSE: 293 mg/dL — AB (ref 65–99)
Glucose, Bld: 402 mg/dL — ABNORMAL HIGH (ref 65–99)
Glucose, Bld: 576 mg/dL (ref 65–99)
Glucose, Bld: 554 mg/dL (ref 65–99)
POTASSIUM: 4.7 mmol/L (ref 3.5–5.1)
POTASSIUM: 5.1 mmol/L (ref 3.5–5.1)
Potassium: 4.8 mmol/L (ref 3.5–5.1)
Potassium: 4.6 mmol/L (ref 3.5–5.1)
SODIUM: 137 mmol/L (ref 135–145)
SODIUM: 137 mmol/L (ref 135–145)
Sodium: 134 mmol/L — ABNORMAL LOW (ref 135–145)
Sodium: 141 mmol/L (ref 135–145)

## 2017-07-10 LAB — GLUCOSE, CAPILLARY
GLUCOSE-CAPILLARY: 362 mg/dL — AB (ref 65–99)
GLUCOSE-CAPILLARY: 505 mg/dL — AB (ref 65–99)
GLUCOSE-CAPILLARY: 536 mg/dL — AB (ref 65–99)
GLUCOSE-CAPILLARY: 486 mg/dL — AB (ref 65–99)
GLUCOSE-CAPILLARY: 429 mg/dL — AB (ref 65–99)
GLUCOSE-CAPILLARY: 282 mg/dL — AB (ref 65–99)
Glucose-Capillary: >600 mg/dL (ref 65–99)
Glucose-Capillary: 484 mg/dL — ABNORMAL HIGH (ref 65–99)
Glucose-Capillary: 374 mg/dL — ABNORMAL HIGH (ref 65–99)
Glucose-Capillary: 299 mg/dL — ABNORMAL HIGH (ref 65–99)
Glucose-Capillary: 198 mg/dL — ABNORMAL HIGH (ref 65–99)
Glucose-Capillary: 233 mg/dL — ABNORMAL HIGH (ref 65–99)

## 2017-07-10 LAB — CBC
HEMATOCRIT: 33.3 % — AB (ref 36.0–46.0)
HEMOGLOBIN: 10.3 g/dL — AB (ref 12.0–15.0)
MCH: 27.8 pg (ref 26.0–34.0)
MCHC: 30.9 g/dL (ref 30.0–36.0)
MCV: 90 fL (ref 78.0–100.0)
Platelets: 262 10*3/uL (ref 150–400)
RBC: 3.7 MIL/uL — ABNORMAL LOW (ref 3.87–5.11)
RDW: 15.6 % — AB (ref 11.5–15.5)
WBC: 9.7 10*3/uL (ref 4.0–10.5)

## 2017-07-10 LAB — HEMOGLOBIN A1C
HEMOGLOBIN A1C: 8 % — AB (ref 4.8–5.6)
Mean Plasma Glucose: 183 mg/dL

## 2017-07-10 LAB — BRAIN NATRIURETIC PEPTIDE: B NATRIURETIC PEPTIDE 5: 142 pg/mL — AB (ref 0.0–100.0)

## 2017-07-10 LAB — MRSA PCR SCREENING: MRSA BY PCR: NEGATIVE

## 2017-07-10 MED ORDER — INSULIN REGULAR BOLUS VIA INFUSION
0.0000 [IU] | Freq: Three times a day (TID) | INTRAVENOUS | Status: DC
  Administered 2017-07-10: 5 [IU] via INTRAVENOUS
  Filled 2017-07-10: qty 10

## 2017-07-10 MED ORDER — INSULIN ASPART 100 UNIT/ML ~~LOC~~ SOLN
14.0000 [IU] | Freq: Three times a day (TID) | SUBCUTANEOUS | Status: DC
  Administered 2017-07-10: 14 [IU] via SUBCUTANEOUS

## 2017-07-10 MED ORDER — INSULIN DETEMIR 100 UNIT/ML ~~LOC~~ SOLN
86.0000 [IU] | Freq: Every day | SUBCUTANEOUS | Status: DC
  Filled 2017-07-10 (×2): qty 0.86

## 2017-07-10 MED ORDER — IPRATROPIUM-ALBUTEROL 0.5-2.5 (3) MG/3ML IN SOLN
3.0000 mL | Freq: Four times a day (QID) | RESPIRATORY_TRACT | Status: DC
  Administered 2017-07-10 (×3): 3 mL via RESPIRATORY_TRACT
  Filled 2017-07-10 (×3): qty 3

## 2017-07-10 MED ORDER — IPRATROPIUM-ALBUTEROL 0.5-2.5 (3) MG/3ML IN SOLN
3.0000 mL | Freq: Three times a day (TID) | RESPIRATORY_TRACT | Status: DC
  Administered 2017-07-11 (×3): 3 mL via RESPIRATORY_TRACT
  Filled 2017-07-10 (×3): qty 3

## 2017-07-10 MED ORDER — INSULIN ASPART 100 UNIT/ML ~~LOC~~ SOLN: 20.0000 [IU] | Freq: Three times a day (TID) | SUBCUTANEOUS | Status: DC

## 2017-07-10 MED ORDER — SODIUM CHLORIDE 0.9 % IV SOLN
INTRAVENOUS | Status: DC
  Administered 2017-07-10: 15:00:00 via INTRAVENOUS

## 2017-07-10 MED ORDER — DEXTROSE 50 % IV SOLN: 25.0000 mL | INTRAVENOUS | Status: DC | PRN

## 2017-07-10 MED ORDER — INSULIN REGULAR HUMAN 100 UNIT/ML IJ SOLN
INTRAMUSCULAR | Status: DC
  Administered 2017-07-10: 5.4 [IU]/h via INTRAVENOUS
  Administered 2017-07-10: 11 [IU]/h via INTRAVENOUS
  Filled 2017-07-10 (×2): qty 1

## 2017-07-10 MED ORDER — INSULIN ASPART 100 UNIT/ML ~~LOC~~ SOLN
30.0000 [IU] | Freq: Three times a day (TID) | SUBCUTANEOUS | Status: DC
  Administered 2017-07-10: 30 [IU] via SUBCUTANEOUS

## 2017-07-10 NOTE — Progress Notes (Signed)
Patient had a hi blood sugar when checked at lunch, MD informed and lab requested to do a blood draw. Lab result of 576 was called to MD, orders given to transfer patient to step-down and start an insulin drip. Report called to ICU and Supervisor transferred patient off of floor.

## 2017-07-10 NOTE — Progress Notes (Signed)
PROGRESS NOTE    [name]  IRS:[id number] DOB: Dec 07, 1934 DOA: 07/08/2017 PCP: [name], MD    Brief Narrative:  Patient is a 83 year old female history of hypertension, diabetes, CVA presenting with a 26-month history of worsening shortness of breath with increasing lower extremity edema, abdominal girth which has worsened significantly over the past week.  Patient noted initially to have shortness of breath on exertion that has progressed to shortness of breath at rest.  Patient seen in the ED initially had to be placed on the BiPAP secondary to her dyspnea.  Patient given IV Lasix with some improvement in respiratory status and wean down to 3 L nasal cannula.  Chest x-ray done concerning for interstitial edema as well as suggestion of a right middle lobe focal opacity concerning for edema versus pneumonia.  Patient admitted for an acute CHF exacerbation.   Assessment & Plan:   Principal Problem:   Acute respiratory failure with hypoxia (HCC) Active Problems:   Acute diastolic CHF (congestive heart failure) (HCC)   GERD (gastroesophageal reflux disease)   Hypertension   Uncontrolled type 2 diabetes mellitus with diabetic nephropathy, with long-term current use of insulin (HCC)   CKD (chronic kidney disease), stage III ([place])   Essential hypertension  #1 acute respiratory failure with hypoxia secondary to acute diastolic heart failure Patient presented with acute respiratory failure with hypoxia initially had to be placed on the BiPAP with concerns for acute diastolic CHF exacerbation.  Patient with worsening shortness of breath which started on exertion and now has progressed to shortness of breath at rest.  Patient also noted to have worsening lower extremity edema and abdominal girth.  BNP on admission was 149.  Chest x-ray concerning for interstitial edema.  Patient given a dose of IV Lasix in the ED with some improvement such that patient was weaned from BiPAP to 3 L nasal  cannula.  Cardiac enzymes seems to have leveled off now at less than 0.03 likely more demand. negative x1.  Will cycle cardiac enzymes every 6 hours x3.  2D echo with EF of 0093%, grade 2 diastolic dysfunction, no wall motion abnormalities.  Patient with a urine output of 3.750 L over the past 24 hours and is supposedly -7 L during this hospitalization.  Patient with clinical improvement.  Continue current regimen of IV Lasix, hydralazine.  Follow.  2.  Chronic Kidney disease stage III Baseline creatinine 1.5-1.9.  Currently at baseline.  Follow closely with diuresis.  Follow.  3.  Poorly controlled diabetes mellitus type 2 with nephropathy Hemoglobin A1c =8. CBGs > 600.  Should also received a couple doses of IV steroids yesterday may be contributing to elevated CBGs.  Will increase Levemir dose to 90 units nightly.  Increase meal coverage NovoLog to 30 units 3 times daily.  Patient is on a sliding scale insulin.  Due to elevated CBGs basic metabolic profile was obtained and glucose noted to be 576, bicarb of 22 with a gap of 15.  Will place patient on the glucose stabilizer at [facility] as patient is volume overloaded.  Once CBGs have improved we will transition back to Levemir 90 units nightly.  Follow.  4.  Hypertension BP stable.  Continue current dose of hydralazine.  Patient on IV Lasix.  Follow.  5.  Gastroesophageal reflux disease Continue PPI.    DVT prophylaxis: Lovenox Code Status: Full Family Communication: Updated patient.No family at bedside. Disposition Plan: Transfer to stepdown unit for initiation of glucose stabilizer.  Consultants:   None  Procedures:   Chest x-ray 07/08/2017  2D echo 07/09/2017   Antimicrobials:   None   Subjective: Patient laying in bed.  Some improvement with shortness of breath.  Patient states still with some wheezing.  Per nursing CBGs significantly elevated on CBG machine is greater than 600.  Basic metabolic profile pending.    Objective: Vitals:   07/09/17 2100 07/10/17 0115 07/10/17 0651 07/10/17 0743  BP: 116/78  (!) 144/61   Pulse: 91  88   Resp: 18  18   Temp: 98.4 F (36.9 C)  97.8 F (36.6 C)   TempSrc: Oral  Oral   SpO2: 98% 99% 99% 97%  Weight:   104.9 kg (231 lb 4.2 oz)   Height:        Intake/Output Summary (Last 24 hours) at 07/10/2017 1233 Last data filed at 07/10/2017 1000 Gross per 24 hour  Intake -  Output 3750 ml  Net -3750 ml   Filed Weights   07/08/17 1524 07/09/17 0858 07/10/17 0651  Weight: 108 kg (238 lb) 107.4 kg (236 lb 12.4 oz) 104.9 kg (231 lb 4.2 oz)    Examination:  General exam: Appears calm and comfortable  Respiratory system: Fair air movement.  Scattered crackles/bibasilar crackles.  Minimal-mild expiratory wheezing.  Respiratory effort normal. Cardiovascular system: Regular rate and rhythm no murmurs rubs or gallops.  2+ bilateral lower extremity edema.  Gastrointestinal system: Abdomen is soft, nontender, nondistended, positive bowel sounds.  Central nervous system: Alert and oriented. No focal neurological deficits. Extremities: Symmetric 5 x 5 power. Skin: No rashes, lesions or ulcers Psychiatry: Judgement and insight appear normal. Mood & affect appropriate.     Data Reviewed: I have personally reviewed following labs and imaging studies  CBC: Recent Labs  Lab 07/08/17 1531 07/09/17 0529 07/10/17 0607  WBC 12.7* 7.6 9.7  NEUTROABS 8.5*  --   --   HGB 10.9* 10.0* 10.3*  HCT 35.6* 32.8* 33.3*  MCV 90.8 90.4 90.0  PLT 275 230 202   Basic Metabolic Panel: Recent Labs  Lab 07/08/17 1531 07/09/17 0529 07/09/17 0855 07/10/17 0607 07/10/17 1145  NA 139 138  --  137 134*  K 4.5 5.2* 5.1 4.7 4.8  CL 109 107  --  101 97*  CO2 20* 22  --  23 22  GLUCOSE 201* 338*  --  402* 576*  BUN 28* 31*  --  46* 48*  CREATININE 1.52* 1.63*  --  1.71* 1.84*  CALCIUM 8.6* 8.6*  --  8.6* 8.7*   GFR: Estimated Creatinine Clearance: 29.3 mL/min (A) (by C-G  formula based on SCr of 1.84 mg/dL (H)). Liver Function Tests: Recent Labs  Lab 07/08/17 1531  AST 15  ALT 14  ALKPHOS 58  BILITOT 0.4  PROT 7.9  ALBUMIN 3.4*   No results for input(s): LIPASE, AMYLASE in the last 168 hours. No results for input(s): AMMONIA in the last 168 hours. Coagulation Profile: No results for input(s): INR, PROTIME in the last 168 hours. Cardiac Enzymes: Recent Labs  Lab 07/08/17 1531 07/09/17 0855 07/09/17 1451 07/09/17 2034  TROPONINI <0.03 <0.03 <0.03 0.03*   BNP (last 3 results) No results for input(s): PROBNP in the last 8760 hours. HbA1C: Recent Labs    07/09/17 0529  HGBA1C 8.0*   CBG: Recent Labs  Lab 07/09/17 1137 07/09/17 1632 07/09/17 2037 07/10/17 0758 07/10/17 1141  GLUCAP 354* 341* 386* 362* >600*   Lipid Profile: No results for input(s): CHOL,  HDL, LDLCALC, TRIG, CHOLHDL, LDLDIRECT in the last 72 hours. Thyroid Function Tests: No results for input(s): TSH, T4TOTAL, FREET4, T3FREE, THYROIDAB in the last 72 hours. Anemia Panel: No results for input(s): VITAMINB12, FOLATE, FERRITIN, TIBC, IRON, RETICCTPCT in the last 72 hours. Sepsis Labs: Recent Labs  Lab 07/08/17 1531  PROCALCITON <0.10    No results found for this or any previous visit (from the past 240 hour(s)).       Radiology Studies: Dg Chest Portable 1 View  Result Date: 07/08/2017 CLINICAL DATA:  Shortness of Breath EXAM: PORTABLE CHEST 1 VIEW COMPARISON:  May 02, 2015 FINDINGS: There is cardiomegaly with pulmonary venous hypertension. There is interstitial pulmonary edema with small pleural effusion on the right. There is a focus of airspace opacity in the right mid lung consistent with either alveolar edema or small focus of pneumonia. IMPRESSION: Findings felt to be indicative of congestive heart failure with interstitial edema and pulmonary vascular congestion. Small right pleural effusion. Focal area of airspace opacity in the right mid lung.  Question focus of alveolar edema versus small focus of superimposed pneumonia. Electronically Signed   By: Lowella Grip III M.D.   On: 07/08/2017 15:47        Scheduled Meds: . enoxaparin (LOVENOX) injection  40 mg Subcutaneous Q24H  . furosemide  40 mg Intravenous BID  . gabapentin  300 mg Oral BID  . hydrALAZINE  25 mg Oral BID  . insulin aspart  0-15 Units Subcutaneous TID WC  . insulin aspart  0-5 Units Subcutaneous QHS  . insulin aspart  30 Units Subcutaneous TID WC  . insulin detemir  86 Units Subcutaneous QHS  . ipratropium-albuterol  3 mL Nebulization Q6H WA  . lubiprostone  8 mcg Oral BID WC  . mouth rinse  15 mL Mouth Rinse BID  . pantoprazole  40 mg Oral Daily  . simvastatin  20 mg Oral Daily  . sodium chloride flush  3 mL Intravenous Q12H   Continuous Infusions: . sodium chloride       LOS: 2 days    Time spent: 35 minutes    Irine Seal, MD [facility] Pager [phone]  If 7PM-7AM, please contact night-coverage [email or web address] Password [facility] 07/10/2017, 12:33 PM

## 2017-07-10 NOTE — Progress Notes (Signed)
Dr. Grandville Silos notified of critical glucose of 554

## 2017-07-11 LAB — BASIC METABOLIC PANEL
ANION GAP: 10 (ref 5–15)
ANION GAP: 11 (ref 5–15)
Anion gap: 10 (ref 5–15)
BUN: 50 mg/dL — AB (ref 6–20)
BUN: 46 mg/dL — AB (ref 6–20)
BUN: 46 mg/dL — AB (ref 6–20)
CALCIUM: 8.9 mg/dL (ref 8.9–10.3)
CALCIUM: 8.4 mg/dL — AB (ref 8.9–10.3)
CO2: 28 mmol/L (ref 22–32)
CO2: 27 mmol/L (ref 22–32)
CO2: 29 mmol/L (ref 22–32)
Calcium: 8.7 mg/dL — ABNORMAL LOW (ref 8.9–10.3)
Chloride: 105 mmol/L (ref 101–111)
Chloride: 102 mmol/L (ref 101–111)
Chloride: 105 mmol/L (ref 101–111)
Creatinine, Ser: 1.64 mg/dL — ABNORMAL HIGH (ref 0.44–1.00)
Creatinine, Ser: 1.58 mg/dL — ABNORMAL HIGH (ref 0.44–1.00)
Creatinine, Ser: 1.57 mg/dL — ABNORMAL HIGH (ref 0.44–1.00)
GFR calc Af Amer: 33 mL/min — ABNORMAL LOW (ref >60)
GFR calc Af Amer: 34 mL/min — ABNORMAL LOW (ref >60)
GFR calc Af Amer: 35 mL/min — ABNORMAL LOW (ref >60)
GFR, EST NON AFRICAN AMERICAN: 28 mL/min — AB (ref >60)
GFR, EST NON AFRICAN AMERICAN: 30 mL/min — AB (ref >60)
GFR, EST NON AFRICAN AMERICAN: 30 mL/min — AB (ref >60)
GLUCOSE: 157 mg/dL — AB (ref 65–99)
GLUCOSE: 101 mg/dL — AB (ref 65–99)
GLUCOSE: 136 mg/dL — AB (ref 65–99)
POTASSIUM: 4.1 mmol/L (ref 3.5–5.1)
POTASSIUM: 4.1 mmol/L (ref 3.5–5.1)
POTASSIUM: 4.4 mmol/L (ref 3.5–5.1)
SODIUM: 140 mmol/L (ref 135–145)
SODIUM: 144 mmol/L (ref 135–145)
Sodium: 143 mmol/L (ref 135–145)

## 2017-07-11 LAB — GLUCOSE, CAPILLARY
GLUCOSE-CAPILLARY: 141 mg/dL — AB (ref 65–99)
GLUCOSE-CAPILLARY: 155 mg/dL — AB (ref 65–99)
GLUCOSE-CAPILLARY: 191 mg/dL — AB (ref 65–99)
GLUCOSE-CAPILLARY: 228 mg/dL — AB (ref 65–99)
Glucose-Capillary: 167 mg/dL — ABNORMAL HIGH (ref 65–99)
Glucose-Capillary: 103 mg/dL — ABNORMAL HIGH (ref 65–99)
Glucose-Capillary: 85 mg/dL (ref 65–99)
Glucose-Capillary: 115 mg/dL — ABNORMAL HIGH (ref 65–99)
Glucose-Capillary: 134 mg/dL — ABNORMAL HIGH (ref 65–99)
Glucose-Capillary: 175 mg/dL — ABNORMAL HIGH (ref 65–99)

## 2017-07-11 MED ORDER — FLUTICASONE PROPIONATE 50 MCG/ACT NA SUSP
2.0000 | Freq: Every day | NASAL | Status: DC
  Administered 2017-07-11 – 2017-07-14 (×4): 2 via NASAL
  Filled 2017-07-11: qty 16

## 2017-07-11 MED ORDER — INSULIN ASPART 100 UNIT/ML ~~LOC~~ SOLN
15.0000 [IU] | Freq: Three times a day (TID) | SUBCUTANEOUS | Status: DC
  Administered 2017-07-11 (×2): 15 [IU] via SUBCUTANEOUS

## 2017-07-11 MED ORDER — LORATADINE 10 MG PO TABS
10.0000 mg | ORAL_TABLET | Freq: Every day | ORAL | Status: DC
  Administered 2017-07-11 – 2017-07-14 (×4): 10 mg via ORAL
  Filled 2017-07-11 (×4): qty 1

## 2017-07-11 MED ORDER — HYDROCODONE-HOMATROPINE 5-1.5 MG/5ML PO SYRP
5.0000 mL | ORAL_SOLUTION | Freq: Three times a day (TID) | ORAL | Status: DC
  Administered 2017-07-11 – 2017-07-14 (×10): 5 mL via ORAL
  Filled 2017-07-11 (×10): qty 5

## 2017-07-11 MED ORDER — INSULIN DETEMIR 100 UNIT/ML ~~LOC~~ SOLN
80.0000 [IU] | Freq: Every day | SUBCUTANEOUS | Status: DC
  Administered 2017-07-11 – 2017-07-12 (×3): 80 [IU] via SUBCUTANEOUS
  Filled 2017-07-11 (×4): qty 0.8

## 2017-07-11 NOTE — Progress Notes (Signed)
Patient received Levemir 80 units per Dr. Grandville Silos verbal order. Insulin drip was stopped  30 min later due to patient's low blood sugar 103 and hour later 85. Patient's Continue to observe patient and check her blood sugar for the next couple hours.

## 2017-07-11 NOTE — Progress Notes (Signed)
PROGRESS NOTE    [name]  PZW:[id number] DOB: Mar 24, 1935 DOA: 07/08/2017 PCP: [name], MD    Brief Narrative:  Patient is a 83 year old female history of hypertension, diabetes, CVA presenting with a 48-month history of worsening shortness of breath with increasing lower extremity edema, abdominal girth which has worsened significantly over the past week.  Patient noted initially to have shortness of breath on exertion that has progressed to shortness of breath at rest.  Patient seen in the ED initially had to be placed on the BiPAP secondary to her dyspnea.  Patient given IV Lasix with some improvement in respiratory status and wean down to 3 L nasal cannula.  Chest x-ray done concerning for interstitial edema as well as suggestion of a right middle lobe focal opacity concerning for edema versus pneumonia.  Patient admitted for an acute CHF exacerbation.   Assessment & Plan:   Principal Problem:   Acute respiratory failure with hypoxia (HCC) Active Problems:   Acute diastolic CHF (congestive heart failure) (HCC)   GERD (gastroesophageal reflux disease)   Hypertension   Uncontrolled type 2 diabetes mellitus with diabetic nephropathy, with long-term current use of insulin (HCC)   CKD (chronic kidney disease), stage III ([place])   Essential hypertension  #1 acute respiratory failure with hypoxia secondary to acute diastolic heart failure Patient presented with acute respiratory failure with hypoxia initially had to be placed on the BiPAP with concerns for acute diastolic CHF exacerbation.  Patient with worsening shortness of breath which started on exertion and now has progressed to shortness of breath at rest.   Patient also noted to have worsening lower extremity edema and abdominal girth.  BNP on admission was 149.  Chest x-ray concerning for interstitial edema.  Patient given a dose of IV Lasix in the ED with some improvement such that patient was weaned from BiPAP to 3 L nasal  cannula.  Cardiac enzymes seems to have leveled off now at less than 0.03 likely more demand ischemia. negative x1.  2D echo with EF of 4235%, grade 2 diastolic dysfunction, no wall motion abnormalities.  Patient with a urine output of 2.9 L L over the past 24 hours and is supposedly -8.1 L during this hospitalization.  Improving clinically but slowly.  Continue IV Lasix and hydralazine for now.  Up in chair.  Mobilize.  Follow.   2.  Chronic Kidney disease stage III Baseline creatinine 1.5-1.9.  Stable at baseline.  Monitor closely with diuretics.    3.  Poorly controlled diabetes mellitus type 2 with nephropathy Hemoglobin A1c =8. CBGs > 600 on 07/10/2017.  He did receive a couple doses of IV steroids on 07/09/2017 likely contributed to elevated CBGs.  Patient had to be placed on the glucose stabilizer yesterday as patient has significantly elevated CBGs.  Patient has been transitioned back to subcutaneous insulin with Levemir at 80 units daily.  Will place back on a carb modified diet.  Start meal coverage NovoLog 15 units with meals 3 times daily and up titrate back to home regimen of 30 units 3 times daily pending CBGs.  Continue sliding scale insulin.  Saline lock IV fluids.  Follow.    4.  Hypertension Currently stable.  Continue IV Lasix, hydralazine.   5.  Gastroesophageal reflux disease Continue PPI.    DVT prophylaxis: Lovenox Code Status: Full Family Communication: Updated patient.No family at bedside. Disposition Plan: Remain in stepdown unit and if CBGs improved transferred back to telemetry.    Consultants:  None  Procedures:   Chest x-ray 07/08/2017  2D echo 07/09/2017   Antimicrobials:   None   Subjective: Patient in bed.  States shortness of breath improving daily however not at baseline.  Patient with complaints of wheezing.  CBGs improved overnight patient transitioned to subcutaneous insulin.  Denies any chest pain.  Objective: Vitals:   07/11/17 0400  07/11/17 0500 07/11/17 0600 07/11/17 0832  BP: 136/61  (!) 145/71   Pulse: 75  74   Resp: 16  12   Temp:    98 F (36.7 C)  TempSrc:    Oral  SpO2: 98%  98% 99%  Weight:  103 kg (227 lb 1.2 oz)    Height:        Intake/Output Summary (Last 24 hours) at 07/11/2017 0925 Last data filed at 07/11/2017 0900 Gross per 24 hour  Intake 564.5 ml  Output 3601 ml  Net -3036.5 ml   Filed Weights   07/10/17 0651 07/10/17 1439 07/11/17 0500  Weight: 104.9 kg (231 lb 4.2 oz) 103.8 kg (228 lb 13.4 oz) 103 kg (227 lb 1.2 oz)    Examination:  General exam: Appears calm and comfortable  Respiratory system: poor- Fair air movement.  Some expiratory wheezing.  Some scattered crackles.   Respiratory effort normal. Cardiovascular system: Regular rate and rhythm no murmurs rubs or gallops.  1-2+ bilateral lower extremity edema.  Gastrointestinal system: Abdomen is nontender, nondistended, soft, positive bowel sounds.  No rebound.  No guarding.   Central nervous system: Alert and oriented x 3. No focal neurological deficits. Extremities: Symmetric 5 x 5 power. Skin: No rashes, lesions or ulcers Psychiatry: Judgement and insight appear normal. Mood & affect appropriate.     Data Reviewed: I have personally reviewed following labs and imaging studies  CBC: Recent Labs  Lab 07/08/17 1531 07/09/17 0529 07/10/17 0607  WBC 12.7* 7.6 9.7  NEUTROABS 8.5*  --   --   HGB 10.9* 10.0* 10.3*  HCT 35.6* 32.8* 33.3*  MCV 90.8 90.4 90.0  PLT 275 230 921   Basic Metabolic Panel: Recent Labs  Lab 07/10/17 1704 07/10/17 2050 07/11/17 0129 07/11/17 0409 07/11/17 0831  NA 137 141 143 140 144  K 5.1 4.6 4.1 4.1 4.4  CL 100* 103 105 102 105  CO2 25 26 28 27 29   GLUCOSE 554* 293* 157* 101* 136*  BUN 50* 49* 50* 46* 46*  CREATININE 1.75* 1.74* 1.64* 1.58* 1.57*  CALCIUM 8.9 8.9 8.9 8.4* 8.7*   GFR: Estimated Creatinine Clearance: 34.1 mL/min (A) (by C-G formula based on SCr of 1.57 mg/dL  (H)). Liver Function Tests: Recent Labs  Lab 07/08/17 1531  AST 15  ALT 14  ALKPHOS 58  BILITOT 0.4  PROT 7.9  ALBUMIN 3.4*   No results for input(s): LIPASE, AMYLASE in the last 168 hours. No results for input(s): AMMONIA in the last 168 hours. Coagulation Profile: No results for input(s): INR, PROTIME in the last 168 hours. Cardiac Enzymes: Recent Labs  Lab 07/08/17 1531 07/09/17 0855 07/09/17 1451 07/09/17 2034  TROPONINI <0.03 <0.03 <0.03 0.03*   BNP (last 3 results) No results for input(s): PROBNP in the last 8760 hours. HbA1C: Recent Labs    07/09/17 0529  HGBA1C 8.0*   CBG: Recent Labs  Lab 07/11/17 0131 07/11/17 0234 07/11/17 0327 07/11/17 0432 07/11/17 0731  GLUCAP 167* 103* 85 115* 141*   Lipid Profile: No results for input(s): CHOL, HDL, LDLCALC, TRIG, CHOLHDL, LDLDIRECT in the last 72  hours. Thyroid Function Tests: No results for input(s): TSH, T4TOTAL, FREET4, T3FREE, THYROIDAB in the last 72 hours. Anemia Panel: No results for input(s): VITAMINB12, FOLATE, FERRITIN, TIBC, IRON, RETICCTPCT in the last 72 hours. Sepsis Labs: Recent Labs  Lab 07/08/17 1531  PROCALCITON <0.10    Recent Results (from the past 240 hour(s))  MRSA PCR Screening     Status: None   Collection Time: 07/10/17  2:48 PM  Result Value Ref Range Status   MRSA by PCR NEGATIVE NEGATIVE Final    Comment:        The GeneXpert MRSA Assay (FDA approved for NASAL specimens only), is one component of a comprehensive MRSA colonization surveillance program. It is not intended to diagnose MRSA infection nor to guide or monitor treatment for MRSA infections. Performed at [facility], [address], [place], [place] [address]          Radiology Studies: No results found.      Scheduled Meds: . enoxaparin (LOVENOX) injection  40 mg Subcutaneous Q24H  . fluticasone  2 spray Each Nare Daily  . furosemide  40 mg Intravenous BID  . gabapentin  300 mg Oral BID  .  hydrALAZINE  25 mg Oral BID  . HYDROcodone-homatropine  5 mL Oral TID  . insulin aspart  0-15 Units Subcutaneous TID WC  . insulin aspart  0-5 Units Subcutaneous QHS  . insulin aspart  15 Units Subcutaneous TID WC  . insulin detemir  80 Units Subcutaneous QHS  . ipratropium-albuterol  3 mL Nebulization TID  . loratadine  10 mg Oral Daily  . lubiprostone  8 mcg Oral BID WC  . mouth rinse  15 mL Mouth Rinse BID  . pantoprazole  40 mg Oral Daily  . simvastatin  20 mg Oral Daily  . sodium chloride flush  3 mL Intravenous Q12H   Continuous Infusions: . sodium chloride    . sodium chloride 10 mL/hr at 07/10/17 1433     LOS: 3 days    Time spent: 40 minutes    Irine Seal, MD [facility] Pager [phone]  If 7PM-7AM, please contact night-coverage [email or web address] Password [facility] 07/11/2017, 9:25 AM

## 2017-07-12 LAB — GLUCOSE, CAPILLARY
GLUCOSE-CAPILLARY: 96 mg/dL (ref 65–99)
GLUCOSE-CAPILLARY: 184 mg/dL — AB (ref 65–99)
GLUCOSE-CAPILLARY: 196 mg/dL — AB (ref 65–99)
Glucose-Capillary: 208 mg/dL — ABNORMAL HIGH (ref 65–99)

## 2017-07-12 LAB — BASIC METABOLIC PANEL
Anion gap: 9 (ref 5–15)
BUN: 54 mg/dL — AB (ref 6–20)
CO2: 28 mmol/L (ref 22–32)
Calcium: 8 mg/dL — ABNORMAL LOW (ref 8.9–10.3)
Chloride: 101 mmol/L (ref 101–111)
Creatinine, Ser: 1.77 mg/dL — ABNORMAL HIGH (ref 0.44–1.00)
GFR, EST AFRICAN AMERICAN: 30 mL/min — AB (ref >60)
GFR, EST NON AFRICAN AMERICAN: 26 mL/min — AB (ref >60)
Glucose, Bld: 130 mg/dL — ABNORMAL HIGH (ref 65–99)
POTASSIUM: 4.1 mmol/L (ref 3.5–5.1)
SODIUM: 138 mmol/L (ref 135–145)

## 2017-07-12 MED ORDER — INSULIN ASPART 100 UNIT/ML ~~LOC~~ SOLN
10.0000 [IU] | Freq: Three times a day (TID) | SUBCUTANEOUS | Status: DC
  Administered 2017-07-12 (×2): 10 [IU] via SUBCUTANEOUS

## 2017-07-12 MED ORDER — IPRATROPIUM-ALBUTEROL 0.5-2.5 (3) MG/3ML IN SOLN
3.0000 mL | Freq: Two times a day (BID) | RESPIRATORY_TRACT | Status: DC
  Administered 2017-07-12 – 2017-07-14 (×5): 3 mL via RESPIRATORY_TRACT
  Filled 2017-07-12 (×5): qty 3

## 2017-07-12 NOTE — Progress Notes (Signed)
PROGRESS NOTE    [name]  OZD:[id number] DOB: 1934/09/01 DOA: 07/08/2017 PCP: [name], MD    Brief Narrative:  Patient is a 83 year old female history of hypertension, diabetes, CVA presenting with a 48-month history of worsening shortness of breath with increasing lower extremity edema, abdominal girth which has worsened significantly over the past week.  Patient noted initially to have shortness of breath on exertion that has progressed to shortness of breath at rest.  Patient seen in the ED initially had to be placed on the BiPAP secondary to her dyspnea.  Patient given IV Lasix with some improvement in respiratory status and wean down to 3 L nasal cannula.  Chest x-ray done concerning for interstitial edema as well as suggestion of a right middle lobe focal opacity concerning for edema versus pneumonia.  Patient admitted for an acute CHF exacerbation.   Assessment & Plan:   Principal Problem:   Acute respiratory failure with hypoxia (HCC) Active Problems:   Acute diastolic CHF (congestive heart failure) (HCC)   GERD (gastroesophageal reflux disease)   Hypertension   Uncontrolled type 2 diabetes mellitus with diabetic nephropathy, with long-term current use of insulin (HCC)   CKD (chronic kidney disease), stage III ([place])   Essential hypertension  #1 acute respiratory failure with hypoxia secondary to acute diastolic heart failure Patient presented with acute respiratory failure with hypoxia initially had to be placed on the BiPAP with concerns for acute diastolic CHF exacerbation.  Patient with worsening shortness of breath which started on exertion and now has progressed to shortness of breath at rest.   Patient also noted to have worsening lower extremity edema and abdominal girth.  BNP on admission was 149.  Chest x-ray concerning for interstitial edema.  Patient given a dose of IV Lasix in the ED with some improvement such that patient was weaned from BiPAP to 3 L nasal  cannula.  Cardiac enzymes seems to have leveled off now at less than 0.03 likely more demand ischemia. negative x1.  2D echo with EF of 2595%, grade 2 diastolic dysfunction, no wall motion abnormalities.  Patient with a urine output of 1.5 L over the past 24 hours and is supposedly -8.1 L during this hospitalization.  Improving clinically but slowly.  Continue IV Lasix and hydralazine for now.  Up in chair.  Mobilize.  Follow.   2.  Chronic Kidney disease stage III Baseline creatinine 1.5-1.9.  Stable at baseline.  Monitor closely with diuretics.    3.  Poorly controlled diabetes mellitus type 2 with nephropathy Hemoglobin A1c =8. CBGs > 600 on 07/10/2017. Patient received a couple doses of IV steroids on 07/09/2017 likely contributed to elevated CBGs.  Patient had to be placed on the glucose stabilizer as patient had significantly elevated CBGs.  Patient has been transitioned back to subcutaneous insulin with Levemir at 80 units daily.  Continue carb modified diet.  CBGs ranging from 96-184.  We will discontinue meal coverage NovoLog for now.  Continue sliding scale insulin.  Follow.   4.  Hypertension Continue hydralazine, current dose IV Lasix.  5.  Gastroesophageal reflux disease PPI.    DVT prophylaxis: Lovenox Code Status: Full Family Communication: Updated patient.No family at bedside. Disposition Plan: Likely home when clinically improved.   Consultants:   None  Procedures:   Chest x-ray 07/08/2017  2D echo 07/09/2017   Antimicrobials:   None   Subjective: Patient states shortness of breath improving on a daily basis.  Still with some wheezing.  Denies any chest pain.    Objective: Vitals:   07/11/17 2004 07/11/17 2038 07/12/17 0500 07/12/17 1108  BP:  (!) 103/54 118/63   Pulse:  99 71   Resp:  18 18   Temp:  98.1 F (36.7 C) (!) 97.4 F (36.3 C)   TempSrc:  Oral Oral   SpO2: 92% 94% 98% 95%  Weight:   102.4 kg (225 lb 12 oz)   Height:        Intake/Output  Summary (Last 24 hours) at 07/12/2017 1119 Last data filed at 07/12/2017 0900 Gross per 24 hour  Intake 1193.33 ml  Output 600 ml  Net 593.33 ml   Filed Weights   07/10/17 1439 07/11/17 0500 07/12/17 0500  Weight: 103.8 kg (228 lb 13.4 oz) 103 kg (227 lb 1.2 oz) 102.4 kg (225 lb 12 oz)    Examination:  General exam: Appears calm and comfortable  Respiratory system: Fair air movement. Minimal expiratory wheezing.  Some scattered crackles.   Respiratory effort normal. Cardiovascular system: RRR,  no murmurs rubs or gallops.  1+ bilateral lower extremity edema.  Gastrointestinal system: Abdomen is, nontender, nondistended, positive bowel sounds.  No rebound.  No guarding.   Central nervous system: Alert and oriented x 3. No focal neurological deficits. Extremities: Symmetric 5 x 5 power. Skin: No rashes, lesions or ulcers Psychiatry: Judgement and insight appear normal. Mood & affect appropriate.     Data Reviewed: I have personally reviewed following labs and imaging studies  CBC: Recent Labs  Lab 07/08/17 1531 07/09/17 0529 07/10/17 0607  WBC 12.7* 7.6 9.7  NEUTROABS 8.5*  --   --   HGB 10.9* 10.0* 10.3*  HCT 35.6* 32.8* 33.3*  MCV 90.8 90.4 90.0  PLT 275 230 818   Basic Metabolic Panel: Recent Labs  Lab 07/10/17 2050 07/11/17 0129 07/11/17 0409 07/11/17 0831 07/12/17 0558  NA 141 143 140 144 138  K 4.6 4.1 4.1 4.4 4.1  CL 103 105 102 105 101  CO2 26 28 27 29 28   GLUCOSE 293* 157* 101* 136* 130*  BUN 49* 50* 46* 46* 54*  CREATININE 1.74* 1.64* 1.58* 1.57* 1.77*  CALCIUM 8.9 8.9 8.4* 8.7* 8.0*   GFR: Estimated Creatinine Clearance: 30.1 mL/min (A) (by C-G formula based on SCr of 1.77 mg/dL (H)). Liver Function Tests: Recent Labs  Lab 07/08/17 1531  AST 15  ALT 14  ALKPHOS 58  BILITOT 0.4  PROT 7.9  ALBUMIN 3.4*   No results for input(s): LIPASE, AMYLASE in the last 168 hours. No results for input(s): AMMONIA in the last 168 hours. Coagulation  Profile: No results for input(s): INR, PROTIME in the last 168 hours. Cardiac Enzymes: Recent Labs  Lab 07/08/17 1531 07/09/17 0855 07/09/17 1451 07/09/17 2034  TROPONINI <0.03 <0.03 <0.03 0.03*   BNP (last 3 results) No results for input(s): PROBNP in the last 8760 hours. HbA1C: No results for input(s): HGBA1C in the last 72 hours. CBG: Recent Labs  Lab 07/11/17 0935 07/11/17 1201 07/11/17 1654 07/11/17 2041 07/12/17 0753  GLUCAP 134* 191* 155* 175* 96   Lipid Profile: No results for input(s): CHOL, HDL, LDLCALC, TRIG, CHOLHDL, LDLDIRECT in the last 72 hours. Thyroid Function Tests: No results for input(s): TSH, T4TOTAL, FREET4, T3FREE, THYROIDAB in the last 72 hours. Anemia Panel: No results for input(s): VITAMINB12, FOLATE, FERRITIN, TIBC, IRON, RETICCTPCT in the last 72 hours. Sepsis Labs: Recent Labs  Lab 07/08/17 1531  PROCALCITON <0.10    Recent Results (from  the past 240 hour(s))  MRSA PCR Screening     Status: None   Collection Time: 07/10/17  2:48 PM  Result Value Ref Range Status   MRSA by PCR NEGATIVE NEGATIVE Final    Comment:        The GeneXpert MRSA Assay (FDA approved for NASAL specimens only), is one component of a comprehensive MRSA colonization surveillance program. It is not intended to diagnose MRSA infection nor to guide or monitor treatment for MRSA infections. Performed at [facility], [address] [place], [place] [address]          Radiology Studies: No results found.      Scheduled Meds: . enoxaparin (LOVENOX) injection  40 mg Subcutaneous Q24H  . fluticasone  2 spray Each Nare Daily  . furosemide  40 mg Intravenous BID  . gabapentin  300 mg Oral BID  . hydrALAZINE  25 mg Oral BID  . HYDROcodone-homatropine  5 mL Oral TID  . insulin aspart  0-15 Units Subcutaneous TID WC  . insulin aspart  0-5 Units Subcutaneous QHS  . insulin aspart  10 Units Subcutaneous TID WC  . insulin detemir  80 Units Subcutaneous QHS    . ipratropium-albuterol  3 mL Nebulization BID  . loratadine  10 mg Oral Daily  . lubiprostone  8 mcg Oral BID WC  . mouth rinse  15 mL Mouth Rinse BID  . pantoprazole  40 mg Oral Daily  . simvastatin  20 mg Oral Daily  . sodium chloride flush  3 mL Intravenous Q12H   Continuous Infusions: . sodium chloride    . sodium chloride 10 mL/hr at 07/10/17 1433     LOS: 4 days    Time spent: 35 minutes    Irine Seal, MD [facility] Pager [phone]  If 7PM-7AM, please contact night-coverage [email or web address] Password [facility] 07/12/2017, 11:19 AM

## 2017-07-12 NOTE — Evaluation (Signed)
Physical Therapy Evaluation Patient Details Name: [name] MRN: [id number] DOB: 11/23/1934 Today's Date: 07/12/2017   History of Present Illness  [name] is an 83yo black female who comes to [facility] on 2/22 with 20-month history of shortness of breath that has significantly worsened over the past week. PMH: Hypertension, diabetes mellitus, stroke with mild residual RLE deficit, bilat knee pain, THA.   Clinical Impression  Pt admitted with above diagnosis. Pt currently with functional limitations due to the deficits listed below (see "PT Problem List"). The pt is awake and agreeable to participate. Pt [name]/o increased bilat knee stiffness form immobility. The pt is alert and oriented x3, pleasant, conversational, and following simple and multi-step commands consistently. Pt received on room air [name] SpO2 >94%, trialed AMB on  Room air [name] SpO2:92% after transfers and AMB of 90ft. At the patients current capacity for functional mobility, no anticipated supplemental O2 needed at this time. Should patient progress in the future to tolerate a 6-minute-walk test, further O2 need can be addressed. Functional mobility assessment demonstrates heavy-to-severe strength impairment in trunk and BLE, the pt now requiring maximal effort to perform transfers. AMB is limited in distance d/t BLE fatigue, and distance is estimated to be off only slightly from baseline. All mobility is performed from MinGuard assist to Supervision level, whereas she performed everything modI to supervision at baseline PTA. Pt will benefit from skilled PT intervention to increase independence and safety with basic mobility in preparation for discharge to the venue listed below.       Follow Up Recommendations Home health PT(is active with [facility] for PT and RN; should continue after DC)    Equipment Recommendations  None recommended by PT    Recommendations for Other Services       Precautions / Restrictions Precautions Precautions:  None Restrictions Weight Bearing Restrictions: No      Mobility  Bed Mobility Overal bed mobility: Modified Independent             General bed mobility comments: additional time and heavy effort required  Transfers Overall transfer level: Needs assistance Equipment used: Rolling walker (2 wheeled) Transfers: Sit to/from Stand Sit to Stand: Supervision;Min guard         General transfer comment: maximal assistance required, segmented mobility, uses bed for knee/hip extension and then RW for trunk extension. Reports to be significantly more difficult than PTA   Ambulation/Gait Ambulation/Gait assistance: Supervision Ambulation Distance (Feet): 60 Feet Assistive device: Rolling walker (2 wheeled)   Gait velocity: 0.70m/s  Gait velocity interpretation: <1.8 ft/sec, indicative of risk for recurrent falls General Gait Details: forward flexed and slow, very weak, reports to be close in distance to baseline funcitonal capacity but kneees are more stiff and painfull than typical. Uses a Right equinus gait as per baseline.   Stairs            Wheelchair Mobility    Modified Rankin (Stroke Patients Only)       Balance Overall balance assessment: Mild deficits observed, not formally tested;Needs assistance         Standing balance support: During functional activity;Bilateral upper extremity supported Standing balance-Leahy Scale: Fair                               Pertinent Vitals/Pain Pain Assessment: No/denies pain Faces Pain Scale: Hurts little more Pain Location: stiffness in knees upon attempt to transfer and AMB  Pain Intervention(s): Limited  activity within patient's tolerance;Monitored during session;Repositioned    Home Living Family/patient expects to be discharged to:: Private residence Living Arrangements: Other relatives Available Help at Discharge: Family Type of Home: House Home Access: Stairs to enter   [employer] of Steps: 2 aindividual stairs Home Layout: One level [place]: Walker - 4 wheels;Grab bars - tub/shower;Wheelchair - manual Additional Comments: current with PT and RN at home with [facility]     Prior Function Level of [place]: Needs assistance   Gait / Transfers Assistance Needed: limited household distances only, room to room; WC in house otherwise   ADL's / Homemaking Assistance Needed: son grandson assist         Hand Dominance        Extremity/Trunk Assessment                Communication   Communication: No difficulties  Cognition Arousal/Alertness: Awake/alert Behavior During Therapy: WFL for tasks assessed/performed Overall Cognitive Status: Within Functional Limits for tasks assessed            General Comments      Exercises     Assessment/Plan    PT Assessment Patient needs continued PT services  PT Problem List Cardiopulmonary status limiting activity;Decreased coordination;Decreased balance;Decreased range of motion;Decreased activity tolerance       PT Treatment Interventions Gait training;Functional mobility training;Therapeutic activities;Therapeutic exercise;Stair training;Patient/family education    PT Goals (Current goals can be found in the Care Plan section)  Acute Rehab PT Goals Patient Stated Goal: improve AMB capcaity, reduce leg pain  PT Goal Formulation: With patient Time For Goal Achievement: 07/26/17 Potential to Achieve Goals: Fair    Frequency Min 3X/week   Barriers to discharge Inaccessible home environment      Co-evaluation               AM-PAC PT "6 Clicks" Daily Activity  Outcome Measure Difficulty turning over in bed (including adjusting bedclothes, sheets and blankets)?: A Lot Difficulty moving from lying on back to sitting on the side of the bed? : A Lot Difficulty sitting down on and standing up from a chair with arms (e.g., wheelchair, bedside commode, etc,.)?: A Lot Help needed  moving to and from a bed to chair (including a wheelchair)?: A Lot Help needed walking in hospital room?: A Lot Help needed climbing 3-5 steps with a railing? : Total 6 Click Score: 11    End of Session Equipment Utilized During Treatment: Gait belt Activity Tolerance: Patient tolerated treatment well;Patient limited by fatigue Patient left: in chair;with call bell/phone within reach;Other (comment)(lunch tray presensted ) Nurse Communication: Mobility status PT Visit Diagnosis: Muscle weakness (generalized) (M62.81);Other abnormalities of gait and mobility (R26.89);Difficulty in walking, not elsewhere classified (R26.2)    Time: 1224-8250 PT Time Calculation (min) (ACUTE ONLY): 17 min   Charges:   PT Evaluation $PT Eval Moderate Complexity: 1 Mod     PT G Codes:        12:52 PM, Aug 05, 2017 Etta Grandchild, PT, DPT Physical Therapist - [place] [phone] (Office)   [name] 05-Aug-2017, 12:45 PM

## 2017-07-13 LAB — GLUCOSE, CAPILLARY
GLUCOSE-CAPILLARY: 228 mg/dL — AB (ref 65–99)
Glucose-Capillary: 164 mg/dL — ABNORMAL HIGH (ref 65–99)
Glucose-Capillary: 145 mg/dL — ABNORMAL HIGH (ref 65–99)
Glucose-Capillary: 131 mg/dL — ABNORMAL HIGH (ref 65–99)

## 2017-07-13 LAB — BASIC METABOLIC PANEL
ANION GAP: 9 (ref 5–15)
BUN: 50 mg/dL — ABNORMAL HIGH (ref 6–20)
CHLORIDE: 102 mmol/L (ref 101–111)
CO2: 27 mmol/L (ref 22–32)
Calcium: 7.9 mg/dL — ABNORMAL LOW (ref 8.9–10.3)
Creatinine, Ser: 1.72 mg/dL — ABNORMAL HIGH (ref 0.44–1.00)
GFR calc non Af Amer: 27 mL/min — ABNORMAL LOW (ref >60)
GFR, EST AFRICAN AMERICAN: 31 mL/min — AB (ref >60)
Glucose, Bld: 201 mg/dL — ABNORMAL HIGH (ref 65–99)
Potassium: 4.1 mmol/L (ref 3.5–5.1)
Sodium: 138 mmol/L (ref 135–145)

## 2017-07-13 MED ORDER — FUROSEMIDE 40 MG PO TABS
40.0000 mg | ORAL_TABLET | Freq: Two times a day (BID) | ORAL | Status: DC
  Administered 2017-07-13 – 2017-07-14 (×2): 40 mg via ORAL
  Filled 2017-07-13 (×2): qty 1

## 2017-07-13 MED ORDER — INSULIN DETEMIR 100 UNIT/ML ~~LOC~~ SOLN
90.0000 [IU] | Freq: Every day | SUBCUTANEOUS | Status: DC
  Filled 2017-07-13 (×2): qty 0.9

## 2017-07-13 MED ORDER — INSULIN ASPART 100 UNIT/ML ~~LOC~~ SOLN
15.0000 [IU] | Freq: Three times a day (TID) | SUBCUTANEOUS | Status: DC
  Administered 2017-07-13 – 2017-07-14 (×4): 15 [IU] via SUBCUTANEOUS

## 2017-07-13 NOTE — Evaluation (Signed)
Occupational Therapy Evaluation Patient Details Name: ELIANNA WINDOM MRN: [id number] DOB: October 20, 1934 Today's Date: 07/13/2017    History of Present Illness Jazzy Parmer is an 83yo black female who comes to [facility] on 2/22 with 38-month history of shortness of breath that has significantly worsened over the past week. PMH: Hypertension, diabetes mellitus, stroke with mild residual RLE deficit, bilat knee pain, THA.    Clinical Impression   Pt received supine in bed, agreeable to OT evaluation. PTA pt's daughters assisting with ADLs as needed. During evaluation pt requiring increased time and effort for ADL task completion. Pt reports left knee aching limits her mobility at times. PTA pt receiving HH PT and RN services, recommend addition of [place] OT services to evaluate pt functioning in home setting and improve safety and independence in ADLs as well as educate and train on DME. No further acute OT services required at this time.     Follow Up Recommendations  Home health OT    Equipment Recommendations  Tub/shower bench       Precautions / Restrictions Precautions Precautions: Fall Restrictions Weight Bearing Restrictions: No      Mobility Bed Mobility Overal bed mobility: Modified Independent             General bed mobility comments: additional time and heavy effort required  Transfers Overall transfer level: Needs assistance Equipment used: Rolling walker (2 wheeled) Transfers: Sit to/from [facility] Sit to Stand: Min guard Stand pivot transfers: Min guard                ADL either performed or assessed with clinical judgement   ADL Overall ADL's : Needs assistance/impaired     Grooming: Wash/dry hands;Set up;Sitting               Lower Body Dressing: Moderate assistance;Sitting/lateral leans   Toilet Transfer: Min guard;BSC;RW   Toileting- Clothing Manipulation and Hygiene: Min guard;Sitting/lateral lean       Functional mobility during  ADLs: Min guard;Rolling walker       Vision Baseline Vision/History: No visual deficits Patient Visual Report: No change from baseline Vision Assessment?: No apparent visual deficits            Pertinent Vitals/Pain Pain Assessment: No/denies pain     Hand Dominance Right   Extremity/Trunk Assessment Upper Extremity Assessment Upper Extremity Assessment: Overall WFL for tasks assessed(RUE slightly weaker than LUE due to prior CVA)   Lower Extremity Assessment Lower Extremity Assessment: Defer to PT evaluation   Cervical / Trunk Assessment Cervical / Trunk Assessment: Kyphotic   Communication Communication Communication: No difficulties   Cognition Arousal/Alertness: Awake/alert Behavior During Therapy: WFL for tasks assessed/performed Overall Cognitive Status: Within Functional Limits for tasks assessed                                                Home Living Family/patient expects to be discharged to:: Private residence Living Arrangements: Other relatives(grandson (28 years old)) Available Help at Discharge: Family Type of Home: House Home Access: Stairs to enter [employer] of Steps: 2 individual stairs   Home Layout: One level     Bathroom Shower/Tub: Teacher, early years/pre: Standard     Home Equipment: Environmental consultant - 4 wheels;Wheelchair - manual;Bedside commode   Additional Comments: current with PT and RN at home with [facility]  Prior Functioning/Environment Level of Independence: Needs assistance  Gait / Transfers Assistance Needed: limited household distances only, room to room; WC in house otherwise  ADL's / Homemaking Assistance Needed: daughters come to assist with bathing, grandson assists with meal preparation and housekeeping            OT Problem List: Decreased strength;Decreased activity tolerance;Impaired balance (sitting and/or standing);Decreased safety awareness       End of Session  Equipment Utilized During Treatment: Gait belt;Rolling walker  Activity Tolerance: Patient tolerated treatment well Patient left: in chair;with call bell/phone within reach  OT Visit Diagnosis: Muscle weakness (generalized) (M62.81)                Time: 3220-2542 OT Time Calculation (min): 26 min Charges:  OT General Charges $OT Visit: 1 Visit OT Evaluation $OT Eval Low Complexity: [place], OTR/L  [phone] 07/13/2017, 8:12 AM

## 2017-07-13 NOTE — Progress Notes (Signed)
Inpatient Diabetes Program Recommendations  AACE/ADA: New Consensus Statement on Inpatient Glycemic Control (2015)  Target Ranges:  Prepandial:   less than 140 mg/dL      Peak postprandial:   less than 180 mg/dL (1-2 hours)      Critically ill patients:  140 - 180 mg/dL   Lab Results  Component Value Date   GLUCAP 208 (H) 07/12/2017   HGBA1C 8.0 (H) 07/09/2017    Review of Glycemic Control Results for KHRYSTAL, JEANMARIE (MRN [id number]) as of 07/13/2017 10:29  Ref. Range 07/12/2017 12:14 07/12/2017 16:40 07/12/2017 20:36  Glucose-Capillary Latest Ref Range: 65 - 99 mg/dL 184 (H) 196 (H) 208 (H)   Diabetes history: Type 2DM Outpatient Diabetes medications: Novolog 20 Units TID, Levemir 100 Units QHS Current orders for Inpatient glycemic control: Novolog 15 Units TID, Levemir 80 Units QHS, Novolog 0-5 Units QHS, Novolog 0-15 Units TID  Inpatient Diabetes Program Recommendations:    Noted increase made to meal coverage this AM, Novolog increased to 15 Units TID. Will continue to watch today.  Would recommend increasing basal to Levemir 90 units QHS.  Thanks, Bronson Curb, MSN, RNC-OB Diabetes Coordinator [phone] (8a-5p)

## 2017-07-13 NOTE — Progress Notes (Signed)
PROGRESS NOTE    [name]  BUL:[id number] DOB: Feb 11, 1935 DOA: 07/08/2017 PCP: [name], MD    Brief Narrative:  Patient is a 83 year old female history of hypertension, diabetes, CVA presenting with a 17-month history of worsening shortness of breath with increasing lower extremity edema, abdominal girth which has worsened significantly over the past week.  Patient noted initially to have shortness of breath on exertion that has progressed to shortness of breath at rest.  Patient seen in the ED initially had to be placed on the BiPAP secondary to her dyspnea.  Patient given IV Lasix with some improvement in respiratory status and wean down to 3 L nasal cannula.  Chest x-ray done concerning for interstitial edema as well as suggestion of a right middle lobe focal opacity concerning for edema versus pneumonia.  Patient admitted for an acute CHF exacerbation.   Assessment & Plan:   Principal Problem:   Acute respiratory failure with hypoxia (HCC) Active Problems:   Acute diastolic CHF (congestive heart failure) (HCC)   GERD (gastroesophageal reflux disease)   Hypertension   Uncontrolled type 2 diabetes mellitus with diabetic nephropathy, with long-term current use of insulin (HCC)   CKD (chronic kidney disease), stage III ([place])   Essential hypertension  #1 acute respiratory failure with hypoxia secondary to acute diastolic heart failure Patient presented with acute respiratory failure with hypoxia initially had to be placed on the BiPAP with concerns for acute diastolic CHF exacerbation.  Patient with worsening shortness of breath which started on exertion and now has progressed to shortness of breath at rest.   Patient also noted to have worsening lower extremity edema and abdominal girth.  BNP on admission was 149.  Chest x-ray concerning for interstitial edema.  Patient given a dose of IV Lasix in the ED with some improvement such that patient was weaned from BiPAP to 3 L nasal  cannula.  Cardiac enzymes seems to have leveled off now at less than 0.03 likely more demand ischemia. negative x1.  2D echo with EF of 32-12%, grade 2 diastolic dysfunction, no wall motion abnormalities.  Patient with a urine output of 900 over the past 24 hours and is supposedly -8.1 L during this hospitalization.  Should improve clinically.  We will transition from IV Lasix to oral Lasix.  Follow.   2.  Chronic Kidney disease stage III Baseline creatinine 1.5-1.9.  Stable at baseline.  Monitor closely with diuretics.    3.  Poorly controlled diabetes mellitus type 2 with nephropathy Hemoglobin A1c =8. CBGs > 600 on 07/10/2017. Patient received a couple doses of IV steroids on 07/09/2017 likely contributed to elevated CBGs.  Patient had to be placed on the glucose stabilizer as patient had significantly elevated CBGs.  Patient has been transitioned back to subcutaneous insulin with Levemir at 80 units daily.  Continue carb modified diet.  CBGs ranging from 164-288.  Will increase Levemir to 90 units daily.  Place back on meal coverage insulin NovoLog 15 units 3 times daily.  Continue sliding scale insulin.    4.  Hypertension Continue hydralazine.  We will transition from IV Lasix to oral Lasix.  Monitor blood pressure.  5.  Gastroesophageal reflux disease Continue PPI.    DVT prophylaxis: Lovenox Code Status: Full Family Communication: Updated patient.No family at bedside. Disposition Plan: Likely home when clinically improved hopefully in the next 24-48 hours..   Consultants:   None  Procedures:   Chest x-ray 07/08/2017  2D echo 07/09/2017   Antimicrobials:  None   Subjective: Patient laying in bed states shortness of breath has improved.  Denies any chest pain.  Patient states some improvement with wheezing.   Objective: Vitals:   07/12/17 2100 07/12/17 2101 07/13/17 0500 07/13/17 0821  BP: (!) 146/55  (!) 131/49   Pulse: 80  75   Resp: 20  18   Temp: 98.1 F (36.7 C)   98.9 F (37.2 C)   TempSrc: Oral  Oral   SpO2: 92% 92% 96% 94%  Weight:   102.5 kg (225 lb 15.5 oz)   Height:        Intake/Output Summary (Last 24 hours) at 07/13/2017 1139 Last data filed at 07/12/2017 2215 Gross per 24 hour  Intake 840 ml  Output 900 ml  Net -60 ml   Filed Weights   07/11/17 0500 07/12/17 0500 07/13/17 0500  Weight: 103 kg (227 lb 1.2 oz) 102.4 kg (225 lb 12 oz) 102.5 kg (225 lb 15.5 oz)    Examination:  General exam: NAD Respiratory system: Fair air movement. Minimal expiratory wheezing.  No crackles noted.  Respiratory effort normal. Cardiovascular system: Regular rate and rhythm.  No murmurs rubs or gallops.  Trace to 1+ bilateral lower extremity edema.  Gastrointestinal system: Abdomen is nondistended, nontender, soft, positive bowel sounds, no guarding, no rebound.  Central nervous system: Alert and oriented x 3. No focal neurological deficits. Extremities: Symmetric 5 x 5 power. Skin: No rashes, lesions or ulcers Psychiatry: Judgement and insight appear normal. Mood & affect appropriate.     Data Reviewed: I have personally reviewed following labs and imaging studies  CBC: Recent Labs  Lab 07/08/17 1531 07/09/17 0529 07/10/17 0607  WBC 12.7* 7.6 9.7  NEUTROABS 8.5*  --   --   HGB 10.9* 10.0* 10.3*  HCT 35.6* 32.8* 33.3*  MCV 90.8 90.4 90.0  PLT 275 230 419   Basic Metabolic Panel: Recent Labs  Lab 07/11/17 0129 07/11/17 0409 07/11/17 0831 07/12/17 0558 07/13/17 0554  NA 143 140 144 138 138  K 4.1 4.1 4.4 4.1 4.1  CL 105 102 105 101 102  CO2 28 27 29 28 27   GLUCOSE 157* 101* 136* 130* 201*  BUN 50* 46* 46* 54* 50*  CREATININE 1.64* 1.58* 1.57* 1.77* 1.72*  CALCIUM 8.9 8.4* 8.7* 8.0* 7.9*   GFR: Estimated Creatinine Clearance: 31 mL/min (A) (by C-G formula based on SCr of 1.72 mg/dL (H)). Liver Function Tests: Recent Labs  Lab 07/08/17 1531  AST 15  ALT 14  ALKPHOS 58  BILITOT 0.4  PROT 7.9  ALBUMIN 3.4*   No results  for input(s): LIPASE, AMYLASE in the last 168 hours. No results for input(s): AMMONIA in the last 168 hours. Coagulation Profile: No results for input(s): INR, PROTIME in the last 168 hours. Cardiac Enzymes: Recent Labs  Lab 07/08/17 1531 07/09/17 0855 07/09/17 1451 07/09/17 2034  TROPONINI <0.03 <0.03 <0.03 0.03*   BNP (last 3 results) No results for input(s): PROBNP in the last 8760 hours. HbA1C: No results for input(s): HGBA1C in the last 72 hours. CBG: Recent Labs  Lab 07/11/17 2041 07/12/17 0753 07/12/17 1214 07/12/17 1640 07/12/17 2036  GLUCAP 175* 96 184* 196* 208*   Lipid Profile: No results for input(s): CHOL, HDL, LDLCALC, TRIG, CHOLHDL, LDLDIRECT in the last 72 hours. Thyroid Function Tests: No results for input(s): TSH, T4TOTAL, FREET4, T3FREE, THYROIDAB in the last 72 hours. Anemia Panel: No results for input(s): VITAMINB12, FOLATE, FERRITIN, TIBC, IRON, RETICCTPCT in the last  72 hours. Sepsis Labs: Recent Labs  Lab 07/08/17 1531  PROCALCITON <0.10    Recent Results (from the past 240 hour(s))  MRSA PCR Screening     Status: None   Collection Time: 07/10/17  2:48 PM  Result Value Ref Range Status   MRSA by PCR NEGATIVE NEGATIVE Final    Comment:        The GeneXpert MRSA Assay (FDA approved for NASAL specimens only), is one component of a comprehensive MRSA colonization surveillance program. It is not intended to diagnose MRSA infection nor to guide or monitor treatment for MRSA infections. Performed at [facility], [address] [place], [place] [address]          Radiology Studies: No results found.      Scheduled Meds: . enoxaparin (LOVENOX) injection  40 mg Subcutaneous Q24H  . fluticasone  2 spray Each Nare Daily  . furosemide  40 mg Intravenous BID  . gabapentin  300 mg Oral BID  . hydrALAZINE  25 mg Oral BID  . HYDROcodone-homatropine  5 mL Oral TID  . insulin aspart  0-15 Units Subcutaneous TID WC  . insulin aspart   0-5 Units Subcutaneous QHS  . insulin aspart  15 Units Subcutaneous TID WC  . insulin detemir  90 Units Subcutaneous QHS  . ipratropium-albuterol  3 mL Nebulization BID  . loratadine  10 mg Oral Daily  . lubiprostone  8 mcg Oral BID WC  . mouth rinse  15 mL Mouth Rinse BID  . pantoprazole  40 mg Oral Daily  . simvastatin  20 mg Oral Daily  . sodium chloride flush  3 mL Intravenous Q12H   Continuous Infusions: . sodium chloride       LOS: 5 days    Time spent: 35 minutes    Irine Seal, MD [facility] Pager [phone]  If 7PM-7AM, please contact night-coverage [email or web address] Password [facility] 07/13/2017, 11:39 AM

## 2017-07-13 NOTE — Progress Notes (Signed)
PT Cancellation Note  Patient Details Name: [name] MRN: [id number] DOB: 1935/02/10   Cancelled Treatment:    Reason Eval/Treat Not Completed: Other (comment)(Chart reviewed, 2 attempts made to see patient, both times patient on [facility] performing personal hygiene with NA. Will continnue to follow, attempt treatment at later time/date as available. )   11:14 AM, 07/13/17 Etta Grandchild, PT, DPT Physical Therapist at [place] [phone] (office)      Buccola,Allan C 07/13/2017, 11:14 AM

## 2017-07-13 NOTE — Care Management Note (Signed)
Case Management Note  Patient Details  Name: [name] MRN: [id number] Date of Birth: 1934/06/16    If discussed at [place] Length of Stay Meetings, dates discussed:  07/13/2017  Additional Comments:  Semaje Kinker, Chauncey Reading, RN 07/13/2017, 4:16 PM

## 2017-07-14 DIAGNOSIS — I10 Essential (primary) hypertension: Secondary | ICD-10-CM

## 2017-07-14 DIAGNOSIS — E1165 Type 2 diabetes mellitus with hyperglycemia: Secondary | ICD-10-CM

## 2017-07-14 DIAGNOSIS — J9600 Acute respiratory failure, unspecified whether with hypoxia or hypercapnia: Secondary | ICD-10-CM

## 2017-07-14 DIAGNOSIS — E1121 Type 2 diabetes mellitus with diabetic nephropathy: Secondary | ICD-10-CM

## 2017-07-14 DIAGNOSIS — I5020 Unspecified systolic (congestive) heart failure: Secondary | ICD-10-CM

## 2017-07-14 DIAGNOSIS — N183 Chronic kidney disease, stage 3 (moderate): Secondary | ICD-10-CM

## 2017-07-14 DIAGNOSIS — K219 Gastro-esophageal reflux disease without esophagitis: Secondary | ICD-10-CM

## 2017-07-14 DIAGNOSIS — J9601 Acute respiratory failure with hypoxia: Secondary | ICD-10-CM

## 2017-07-14 DIAGNOSIS — I5031 Acute diastolic (congestive) heart failure: Secondary | ICD-10-CM

## 2017-07-14 DIAGNOSIS — Z794 Long term (current) use of insulin: Secondary | ICD-10-CM

## 2017-07-14 LAB — CBC
HCT: 36.4 % (ref 36.0–46.0)
Hemoglobin: 11.3 g/dL — ABNORMAL LOW (ref 12.0–15.0)
MCH: 28 pg (ref 26.0–34.0)
MCHC: 31 g/dL (ref 30.0–36.0)
MCV: 90.3 fL (ref 78.0–100.0)
PLATELETS: 255 10*3/uL (ref 150–400)
RBC: 4.03 MIL/uL (ref 3.87–5.11)
RDW: 15.4 % (ref 11.5–15.5)
WBC: 9.8 10*3/uL (ref 4.0–10.5)

## 2017-07-14 LAB — BASIC METABOLIC PANEL
Anion gap: 11 (ref 5–15)
BUN: 42 mg/dL — ABNORMAL HIGH (ref 6–20)
CALCIUM: 8 mg/dL — AB (ref 8.9–10.3)
CO2: 25 mmol/L (ref 22–32)
Chloride: 101 mmol/L (ref 101–111)
Creatinine, Ser: 1.6 mg/dL — ABNORMAL HIGH (ref 0.44–1.00)
GFR calc Af Amer: 34 mL/min — ABNORMAL LOW (ref >60)
GFR, EST NON AFRICAN AMERICAN: 29 mL/min — AB (ref >60)
Glucose, Bld: 186 mg/dL — ABNORMAL HIGH (ref 65–99)
POTASSIUM: 4.2 mmol/L (ref 3.5–5.1)
SODIUM: 137 mmol/L (ref 135–145)

## 2017-07-14 LAB — GLUCOSE, CAPILLARY: GLUCOSE-CAPILLARY: 194 mg/dL — AB (ref 65–99)

## 2017-07-14 MED ORDER — FUROSEMIDE 40 MG PO TABS: 40.0000 mg | ORAL_TABLET | Freq: Two times a day (BID) | ORAL | 0 refills | Status: DC

## 2017-07-14 MED ORDER — POTASSIUM CHLORIDE CRYS ER 10 MEQ PO TBCR: 10.0000 meq | EXTENDED_RELEASE_TABLET | Freq: Every day | ORAL | 0 refills | Status: DC

## 2017-07-14 MED ORDER — FLUTICASONE PROPIONATE 50 MCG/ACT NA SUSP: 1.0000 | Freq: Every day | NASAL | 0 refills | Status: DC

## 2017-07-14 MED ORDER — LORATADINE 10 MG PO TABS: 10.0000 mg | ORAL_TABLET | Freq: Every day | ORAL | 0 refills | Status: DC

## 2017-07-14 MED ORDER — HYDROCODONE-HOMATROPINE 5-1.5 MG/5ML PO SYRP: 5.0000 mL | ORAL_SOLUTION | Freq: Three times a day (TID) | ORAL | 0 refills | Status: DC | PRN

## 2017-07-14 MED ORDER — IPRATROPIUM-ALBUTEROL 0.5-2.5 (3) MG/3ML IN SOLN: 3.0000 mL | Freq: Four times a day (QID) | RESPIRATORY_TRACT | Status: DC | PRN

## 2017-07-14 MED ORDER — HYDRALAZINE HCL 25 MG PO TABS: 25.0000 mg | ORAL_TABLET | Freq: Two times a day (BID) | ORAL | 0 refills | Status: DC

## 2017-07-14 NOTE — Care Management Note (Signed)
Case Management Note  Patient Details  Name: LYLIAN SANAGUSTIN MRN: [id number] Date of Birth: [birth year]     Expected Discharge Date:    07/14/2017              Expected Discharge Plan:  [place]  In-House Referral:     Discharge planning Services  CM Consult  Post Acute Care Choice:  [place], Resumption of Svcs/PTA Provider Choice offered to:  Patient  DME Arranged:  3-N-1 DME Agency:  [place]:    [place]:  [place]  Status of Service:  Completed, signed off  If discussed at [place] of Stay Meetings, dates discussed:    Additional Comments: Patient discharging home today with resumption of HH with [facility]. Patient has received her BSC, does not want a shower stool. No other CM needs. Plans for daughter to transport her home.   Jacquilyn Seldon, Chauncey [place], RN 07/14/2017, 11:27 AM

## 2017-07-14 NOTE — Progress Notes (Signed)
Patient states understanding of discharge instructions.  

## 2017-07-14 NOTE — Discharge Summary (Signed)
Physician Discharge Summary  [name] XNT:[id number] DOB: 03-Jul-1934 DOA: 07/08/2017  PCP: [name], MD  Admit date: 07/08/2017 Discharge date: 07/14/2017  Time spent: 45 minutes  Recommendations for Outpatient Follow-up:  Dictation #1 [id number]  [id number]   Discharge Diagnoses:  Acute respiratory failure with hypoxia secondary to acute diastolic heart failure Chronic kidney disease, stage III Diabetes mellitus, type II,  poorly controlled Essential hypertension GERD Deconditioning   Discharge Condition: Stable  Diet recommendation: Heart healthy/carb modified  Filed Weights   07/12/17 0500 07/13/17 0500 07/14/17 0701  Weight: 102.4 kg (225 lb 12 oz) 102.5 kg (225 lb 15.5 oz) 102 kg (224 lb 13.9 oz)    History of present illness:  On 07/08/2017 by Dr. Shanon Brow Tat [name] is a 83 y.o. female with medical history of hypertension, diabetes mellitus, stroke presented with 100-month history of shortness of breath that has significantly worsened over the past week.  The patient also notes increasing leg edema and increasing abdominal girth during the same period of time with acute worsening in the past week.  She stated that her dyspnea initially started as dyspnea on exertion, but it has progressed to the point where she is having dyspnea at rest.  She denied any fevers, chills, chest pain, nausea, vomiting, diarrhea, abdominal pain, dysuria.  She states that she saw her primary care provider, Dr. Legrand Rams, on 06/23/17.  At that time, the patient was restarted on 1 of her antihypertensive medications that she had not taken for 2 months.  In addition, the patient was also started on furosemide once daily although she could not remember the dose.  In the emergency department, the patient was afebrile and hemodynamically stable.  The patient was initially placed on BiPAP secondary to her dyspnea.  However after IV furosemide, her respiratory status improved, and she was weaned to  3 L.  BMP and LFTs were essentially unremarkable.  Her serum creatinine was at her baseline.  WBC was 12.7 with hemoglobin 10.9.  BNP was 149.  Chest x-ray showed interstitial edema.  There is suggestion of a right middle lobe focal opacity concerning for edema versus PNA.  Hospital Course:  Acute respiratory failure with hypoxia secondary to acute diastolic heart failure -Patient presented with acute respiratory failure with hypoxia and initially required BiPAP -Lower extremity edema with abdominal girth -BNP on admission 149 -Chest x-ray showed interstitial edema -Patient was given IV Lasix with improvement and was able to be weaned to nasal cannula -Echocardiogram showed an EF of 90-30%, grade 2 diastolic dysfunction, no regional wall motion abnormalities -trop cycled and unremarkable  -Weight down approximately 14 pounds since admission -patient was on IV lasix and transitioned to oral lasix 40mg  BID -follow up with cardiology  -during hospitalization, losartan, spironolactone held- patient will need to follow up with PCP regarding resumption of these medications -currently maintaining oxygen saturations of 96% on room air  Chronic kidney disease, stage III -Creatinine appears to be at baseline, would repeat BMP in 1 week  Diabetes mellitus, type II,  poorly controlled -Hemoglobin A1c 8 -Patient was noted to have uncontrolled CBGs on 07/10/2017, over 600.  However he did receive IV steroids which may have contributed to his hyperglycemia -Patient did require a glucose stabilizer, CBGs improved.  Patient was transitioned back to Levemir and insulin sliding scale  Essential hypertension -Continue hydralazine, Lasix -as above, amlodipine, spironolactone, losartan were held-Patient to discuss with either cardiology or PCP resumption of those medications  GERD -Continue PPI  Deconditioning  -PT, OT consulted and recommended home  health  Procedures: Echocardiogram  Consultations: none  Discharge Exam: Vitals:   07/14/17 0500 07/14/17 0816  BP: (!) 135/50   Pulse: 75   Resp: 20   Temp: 98 F (36.7 C)   SpO2: 97% 96%   Patient feels breathing has improved.  Denies current chest pain, abdominal pain, nausea or vomiting, diarrhea constipation, dizziness or headache.   General: Well developed, well nourished, NAD, appears stated age  HEENT: NCAT, mucous membranes moist.  Neck: Supple  Cardiovascular: S1 S2 auscultated, no rubs, murmurs or gallops. Regular rate and rhythm.  Respiratory: Diminished however clear, no wheezing or crackles   Abdomen: Soft, obese, nontender, nondistended, + bowel sounds  Extremities: warm dry without cyanosis clubbing. Trace LE edema  Neuro: AAOx3, nonfocal  Skin: Without rashes exudates or nodules, chronic skin changes on LE B/L   Psych: Normal affect and demeanor with intact judgement and insight  Discharge Instructions Discharge Instructions    Discharge instructions   Complete by:  As directed    Patient will be discharged to home with home health physical and occupational therapy.  Patient will need to follow up with primary care provider within one week of discharge, repeat BMP and discuss blood pressure management.  Follow up with Cardiology.  Patient should continue medications as prescribed.  Patient should follow a heart healthy/carb modified diet.     Allergies as of 07/14/2017   No Known Allergies     Medication List    STOP taking these medications   amLODipine 10 MG tablet Commonly known as:  NORVASC   losartan 100 MG tablet Commonly known as:  COZAAR   spironolactone 25 MG tablet Commonly known as:  ALDACTONE     TAKE these medications   fluticasone 50 MCG/ACT nasal spray Commonly known as:  FLONASE Place 1 spray into both nostrils daily. Start taking on:  07/15/2017   furosemide 40 MG tablet Commonly known as:  LASIX Take 1 tablet  (40 mg total) by mouth 2 (two) times daily.   gabapentin 300 MG capsule Commonly known as:  NEURONTIN Take 300 mg by mouth 2 (two) times daily.   hydrALAZINE 25 MG tablet Commonly known as:  APRESOLINE Take 1 tablet (25 mg total) by mouth 2 (two) times daily. What changed:  Another medication with the same name was removed. Continue taking this medication, and follow the directions you see here.   HYDROcodone-homatropine 5-1.5 MG/5ML syrup Commonly known as:  HYCODAN Take 5 mLs by mouth 3 (three) times daily as needed for cough.   hydrocortisone 2.5 % rectal cream Commonly known as:  ANUSOL-HC Place 1 application rectally 2 (two) times daily. What changed:    when to take this  reasons to take this   insulin aspart 100 UNIT/ML injection Commonly known as:  novoLOG Inject 20 Units into the skin 3 (three) times daily before meals.   insulin detemir 100 UNIT/ML injection Commonly known as:  LEVEMIR Inject 100 Units into the skin at bedtime.   loratadine 10 MG tablet Commonly known as:  CLARITIN Take 1 tablet (10 mg total) by mouth daily. Start taking on:  07/15/2017   lubiprostone 8 MCG capsule Commonly known as:  AMITIZA Take 8 mcg by mouth 2 (two) times daily as needed for constipation.   pantoprazole 40 MG tablet Commonly known as:  PROTONIX TAKE 1 TABLET BY MOUTH ONCE DAILY.   potassium chloride 10 MEQ tablet Commonly known as:  K-DUR,KLOR-CON Take 1 tablet (10 mEq total) by mouth daily. What changed:    medication strength  how much to take   simvastatin 20 MG tablet Commonly known as:  ZOCOR Take 20 mg by mouth daily.            Durable Medical Equipment  (From admission, onward)        Start     Ordered   07/13/17 1116  For home use only DME Shower stool  Once     07/13/17 1115   07/09/17 1236  For home use only DME Bedside commode  Once    Question:  Patient needs a bedside commode to treat with the following condition  Answer:  CHF  (congestive heart failure) ([place])   07/09/17 1236     No Known Allergies Follow-up Information    Herminio Commons, MD Follow up.   Specialty:  Cardiology Why:  The office will contact you within 2-3 business days to arrange Hospital follow-up. Please call the number provided if you do not hear from them.  Contact information: [place] [address] [phone]        [name], MD. Schedule an appointment as soon as possible for a visit in 1 week(s).   Specialty:  Internal Medicine Why:  f/u in 1-2 weeks. Contact information: [place] [address] [phone]            The results of significant diagnostics from this hospitalization (including imaging, microbiology, ancillary and laboratory) are listed below for reference.    Significant Diagnostic Studies: Dg Chest Portable 1 View  Result Date: 07/08/2017 CLINICAL DATA:  Shortness of Breath EXAM: PORTABLE CHEST 1 VIEW COMPARISON:  May 02, 2015 FINDINGS: There is cardiomegaly with pulmonary venous hypertension. There is interstitial pulmonary edema with small pleural effusion on the right. There is a focus of airspace opacity in the right mid lung consistent with either alveolar edema or small focus of pneumonia. IMPRESSION: Findings felt to be indicative of congestive heart failure with interstitial edema and pulmonary vascular congestion. Small right pleural effusion. Focal area of airspace opacity in the right mid lung. Question focus of alveolar edema versus small focus of superimposed pneumonia. Electronically Signed   By: Lowella Grip III M.D.   On: 07/08/2017 15:47    Microbiology: Recent Results (from the past 240 hour(s))  MRSA PCR Screening     Status: None   Collection Time: 07/10/17  2:48 PM  Result Value Ref Range Status   MRSA by PCR NEGATIVE NEGATIVE Final    Comment:        The GeneXpert MRSA Assay (FDA approved for NASAL specimens only), is one  component of a comprehensive MRSA colonization surveillance program. It is not intended to diagnose MRSA infection nor to guide or monitor treatment for MRSA infections. Performed at [facility], [address] [place], [place] [address]      Labs: Basic Metabolic Panel: Recent Labs  Lab 07/11/17 0409 07/11/17 0831 07/12/17 0558 07/13/17 0554 07/14/17 0555  NA 140 144 138 138 137  K 4.1 4.4 4.1 4.1 4.2  CL 102 105 101 102 101  CO2 27 29 28 27 25   GLUCOSE 101* 136* 130* 201* 186*  BUN 46* 46* 54* 50* 42*  CREATININE 1.58* 1.57* 1.77* 1.72* 1.60*  CALCIUM 8.4* 8.7* 8.0* 7.9* 8.0*   Liver Function Tests: Recent Labs  Lab 07/08/17 1531  AST 15  ALT 14  ALKPHOS  58  BILITOT 0.4  PROT 7.9  ALBUMIN 3.4*   No results for input(s): LIPASE, AMYLASE in the last 168 hours. No results for input(s): AMMONIA in the last 168 hours. CBC: Recent Labs  Lab 07/08/17 1531 07/09/17 0529 07/10/17 0607 07/14/17 0555  WBC 12.7* 7.6 9.7 9.8  NEUTROABS 8.5*  --   --   --   HGB 10.9* 10.0* 10.3* 11.3*  HCT 35.6* 32.8* 33.3* 36.4  MCV 90.8 90.4 90.0 90.3  PLT 275 230 262 255   Cardiac Enzymes: Recent Labs  Lab 07/08/17 1531 07/09/17 0855 07/09/17 1451 07/09/17 2034  TROPONINI <0.03 <0.03 <0.03 0.03*   BNP: BNP (last 3 results) Recent Labs    07/08/17 1532 07/10/17 0607  BNP 149.0* 142.0*    ProBNP (last 3 results) No results for input(s): PROBNP in the last 8760 hours.  CBG: Recent Labs  Lab 07/13/17 0753 07/13/17 1159 07/13/17 1641 07/13/17 2155 07/14/17 0800  GLUCAP 164* 228* 145* 131* 194*       Signed:  Ebonie Westerlund  [facility] 07/14/2017, 12:34 PM

## 2017-07-14 NOTE — Care Management Important Message (Signed)
Important Message  Patient Details  Name: [name] MRN: [id number] Date of Birth: [birth year]   Medicare Important Message Given:  Yes    Brandin Stetzer, Chauncey Reading, RN 07/14/2017, 11:27 AM

## 2017-07-14 NOTE — Progress Notes (Signed)
Physical Therapy Note  Patient Details  Name: [name] MRN: [id number] Date of Birth: 06/02/1934 Today's Date: 07/14/2017    Pt sitting up in recliner, reports she just had bath and nursing assisted her to chair.  Pt reports she is being discharged shortly and refuses to work with therapy.  States she has no functional issues and is walking fine with walker.  Noted swelling in distal LE's (Rt>Lt) and discussed compression stockings.  Pt states she wears them but needs new ones.  Pt given information on [employer] in [place] to purchase more.  Teena Irani, PTA/CLT [phone]    Roseanne Reno B 07/14/2017, 11:46 AM

## 2017-07-15 DIAGNOSIS — K219 Gastro-esophageal reflux disease without esophagitis: Secondary | ICD-10-CM | POA: Diagnosis not present

## 2017-07-15 DIAGNOSIS — M545 Low back pain: Secondary | ICD-10-CM | POA: Diagnosis not present

## 2017-07-15 DIAGNOSIS — E1142 Type 2 diabetes mellitus with diabetic polyneuropathy: Secondary | ICD-10-CM | POA: Diagnosis not present

## 2017-07-15 DIAGNOSIS — I10 Essential (primary) hypertension: Secondary | ICD-10-CM | POA: Diagnosis not present

## 2017-07-15 DIAGNOSIS — Z8781 Personal history of (healed) traumatic fracture: Secondary | ICD-10-CM | POA: Diagnosis not present

## 2017-07-15 DIAGNOSIS — I69351 Hemiplegia and hemiparesis following cerebral infarction affecting right dominant side: Secondary | ICD-10-CM | POA: Diagnosis not present

## 2017-07-15 DIAGNOSIS — I6350 Cerebral infarction due to unspecified occlusion or stenosis of unspecified cerebral artery: Secondary | ICD-10-CM | POA: Diagnosis not present

## 2017-07-15 DIAGNOSIS — G8190 Hemiplegia, unspecified affecting unspecified side: Secondary | ICD-10-CM | POA: Diagnosis not present

## 2017-07-15 DIAGNOSIS — M859 Disorder of bone density and structure, unspecified: Secondary | ICD-10-CM | POA: Diagnosis not present

## 2017-07-15 DIAGNOSIS — I679 Cerebrovascular disease, unspecified: Secondary | ICD-10-CM | POA: Diagnosis not present

## 2017-07-15 DIAGNOSIS — Z6839 Body mass index (BMI) 39.0-39.9, adult: Secondary | ICD-10-CM | POA: Diagnosis not present

## 2017-07-15 DIAGNOSIS — I872 Venous insufficiency (chronic) (peripheral): Secondary | ICD-10-CM | POA: Diagnosis not present

## 2017-07-15 LAB — GLUCOSE, CAPILLARY: Glucose-Capillary: 228 mg/dL — ABNORMAL HIGH (ref 65–99)

## 2017-07-16 DIAGNOSIS — I69351 Hemiplegia and hemiparesis following cerebral infarction affecting right dominant side: Secondary | ICD-10-CM | POA: Diagnosis not present

## 2017-07-16 DIAGNOSIS — K219 Gastro-esophageal reflux disease without esophagitis: Secondary | ICD-10-CM | POA: Diagnosis not present

## 2017-07-16 DIAGNOSIS — E1142 Type 2 diabetes mellitus with diabetic polyneuropathy: Secondary | ICD-10-CM | POA: Diagnosis not present

## 2017-07-16 DIAGNOSIS — Z8781 Personal history of (healed) traumatic fracture: Secondary | ICD-10-CM | POA: Diagnosis not present

## 2017-07-16 DIAGNOSIS — Z6839 Body mass index (BMI) 39.0-39.9, adult: Secondary | ICD-10-CM | POA: Diagnosis not present

## 2017-07-16 DIAGNOSIS — I10 Essential (primary) hypertension: Secondary | ICD-10-CM | POA: Diagnosis not present

## 2017-07-16 DIAGNOSIS — M859 Disorder of bone density and structure, unspecified: Secondary | ICD-10-CM | POA: Diagnosis not present

## 2017-07-16 DIAGNOSIS — I872 Venous insufficiency (chronic) (peripheral): Secondary | ICD-10-CM | POA: Diagnosis not present

## 2017-07-19 DIAGNOSIS — I872 Venous insufficiency (chronic) (peripheral): Secondary | ICD-10-CM | POA: Diagnosis not present

## 2017-07-19 DIAGNOSIS — Z8781 Personal history of (healed) traumatic fracture: Secondary | ICD-10-CM | POA: Diagnosis not present

## 2017-07-19 DIAGNOSIS — I69351 Hemiplegia and hemiparesis following cerebral infarction affecting right dominant side: Secondary | ICD-10-CM | POA: Diagnosis not present

## 2017-07-19 DIAGNOSIS — I10 Essential (primary) hypertension: Secondary | ICD-10-CM | POA: Diagnosis not present

## 2017-07-19 DIAGNOSIS — K219 Gastro-esophageal reflux disease without esophagitis: Secondary | ICD-10-CM | POA: Diagnosis not present

## 2017-07-19 DIAGNOSIS — E1142 Type 2 diabetes mellitus with diabetic polyneuropathy: Secondary | ICD-10-CM | POA: Diagnosis not present

## 2017-07-19 DIAGNOSIS — M859 Disorder of bone density and structure, unspecified: Secondary | ICD-10-CM | POA: Diagnosis not present

## 2017-07-19 DIAGNOSIS — Z6839 Body mass index (BMI) 39.0-39.9, adult: Secondary | ICD-10-CM | POA: Diagnosis not present

## 2017-07-20 DIAGNOSIS — I872 Venous insufficiency (chronic) (peripheral): Secondary | ICD-10-CM | POA: Diagnosis not present

## 2017-07-20 DIAGNOSIS — Z8781 Personal history of (healed) traumatic fracture: Secondary | ICD-10-CM | POA: Diagnosis not present

## 2017-07-20 DIAGNOSIS — K219 Gastro-esophageal reflux disease without esophagitis: Secondary | ICD-10-CM | POA: Diagnosis not present

## 2017-07-20 DIAGNOSIS — I69351 Hemiplegia and hemiparesis following cerebral infarction affecting right dominant side: Secondary | ICD-10-CM | POA: Diagnosis not present

## 2017-07-20 DIAGNOSIS — E1142 Type 2 diabetes mellitus with diabetic polyneuropathy: Secondary | ICD-10-CM | POA: Diagnosis not present

## 2017-07-20 DIAGNOSIS — M859 Disorder of bone density and structure, unspecified: Secondary | ICD-10-CM | POA: Diagnosis not present

## 2017-07-20 DIAGNOSIS — Z6839 Body mass index (BMI) 39.0-39.9, adult: Secondary | ICD-10-CM | POA: Diagnosis not present

## 2017-07-20 DIAGNOSIS — I10 Essential (primary) hypertension: Secondary | ICD-10-CM | POA: Diagnosis not present

## 2017-07-21 DIAGNOSIS — M859 Disorder of bone density and structure, unspecified: Secondary | ICD-10-CM | POA: Diagnosis not present

## 2017-07-21 DIAGNOSIS — I872 Venous insufficiency (chronic) (peripheral): Secondary | ICD-10-CM | POA: Diagnosis not present

## 2017-07-21 DIAGNOSIS — Z6839 Body mass index (BMI) 39.0-39.9, adult: Secondary | ICD-10-CM | POA: Diagnosis not present

## 2017-07-21 DIAGNOSIS — K219 Gastro-esophageal reflux disease without esophagitis: Secondary | ICD-10-CM | POA: Diagnosis not present

## 2017-07-21 DIAGNOSIS — E1142 Type 2 diabetes mellitus with diabetic polyneuropathy: Secondary | ICD-10-CM | POA: Diagnosis not present

## 2017-07-21 DIAGNOSIS — I10 Essential (primary) hypertension: Secondary | ICD-10-CM | POA: Diagnosis not present

## 2017-07-21 DIAGNOSIS — Z8781 Personal history of (healed) traumatic fracture: Secondary | ICD-10-CM | POA: Diagnosis not present

## 2017-07-21 DIAGNOSIS — I69351 Hemiplegia and hemiparesis following cerebral infarction affecting right dominant side: Secondary | ICD-10-CM | POA: Diagnosis not present

## 2017-07-22 DIAGNOSIS — I69351 Hemiplegia and hemiparesis following cerebral infarction affecting right dominant side: Secondary | ICD-10-CM | POA: Diagnosis not present

## 2017-07-22 DIAGNOSIS — I872 Venous insufficiency (chronic) (peripheral): Secondary | ICD-10-CM | POA: Diagnosis not present

## 2017-07-22 DIAGNOSIS — I5031 Acute diastolic (congestive) heart failure: Secondary | ICD-10-CM | POA: Diagnosis not present

## 2017-07-22 DIAGNOSIS — Z6839 Body mass index (BMI) 39.0-39.9, adult: Secondary | ICD-10-CM | POA: Diagnosis not present

## 2017-07-22 DIAGNOSIS — K219 Gastro-esophageal reflux disease without esophagitis: Secondary | ICD-10-CM | POA: Diagnosis not present

## 2017-07-22 DIAGNOSIS — E1165 Type 2 diabetes mellitus with hyperglycemia: Secondary | ICD-10-CM | POA: Diagnosis not present

## 2017-07-22 DIAGNOSIS — J9602 Acute respiratory failure with hypercapnia: Secondary | ICD-10-CM | POA: Diagnosis not present

## 2017-07-22 DIAGNOSIS — I10 Essential (primary) hypertension: Secondary | ICD-10-CM | POA: Diagnosis not present

## 2017-07-22 DIAGNOSIS — Z8781 Personal history of (healed) traumatic fracture: Secondary | ICD-10-CM | POA: Diagnosis not present

## 2017-07-22 DIAGNOSIS — M859 Disorder of bone density and structure, unspecified: Secondary | ICD-10-CM | POA: Diagnosis not present

## 2017-07-22 DIAGNOSIS — E1142 Type 2 diabetes mellitus with diabetic polyneuropathy: Secondary | ICD-10-CM | POA: Diagnosis not present

## 2017-07-23 DIAGNOSIS — E1142 Type 2 diabetes mellitus with diabetic polyneuropathy: Secondary | ICD-10-CM | POA: Diagnosis not present

## 2017-07-23 DIAGNOSIS — K219 Gastro-esophageal reflux disease without esophagitis: Secondary | ICD-10-CM | POA: Diagnosis not present

## 2017-07-23 DIAGNOSIS — I10 Essential (primary) hypertension: Secondary | ICD-10-CM | POA: Diagnosis not present

## 2017-07-23 DIAGNOSIS — I872 Venous insufficiency (chronic) (peripheral): Secondary | ICD-10-CM | POA: Diagnosis not present

## 2017-07-23 DIAGNOSIS — I69351 Hemiplegia and hemiparesis following cerebral infarction affecting right dominant side: Secondary | ICD-10-CM | POA: Diagnosis not present

## 2017-07-23 DIAGNOSIS — Z6839 Body mass index (BMI) 39.0-39.9, adult: Secondary | ICD-10-CM | POA: Diagnosis not present

## 2017-07-23 DIAGNOSIS — M859 Disorder of bone density and structure, unspecified: Secondary | ICD-10-CM | POA: Diagnosis not present

## 2017-07-23 DIAGNOSIS — Z8781 Personal history of (healed) traumatic fracture: Secondary | ICD-10-CM | POA: Diagnosis not present

## 2017-07-26 DIAGNOSIS — K219 Gastro-esophageal reflux disease without esophagitis: Secondary | ICD-10-CM | POA: Diagnosis not present

## 2017-07-26 DIAGNOSIS — E1142 Type 2 diabetes mellitus with diabetic polyneuropathy: Secondary | ICD-10-CM | POA: Diagnosis not present

## 2017-07-26 DIAGNOSIS — M859 Disorder of bone density and structure, unspecified: Secondary | ICD-10-CM | POA: Diagnosis not present

## 2017-07-26 DIAGNOSIS — I10 Essential (primary) hypertension: Secondary | ICD-10-CM | POA: Diagnosis not present

## 2017-07-26 DIAGNOSIS — I69351 Hemiplegia and hemiparesis following cerebral infarction affecting right dominant side: Secondary | ICD-10-CM | POA: Diagnosis not present

## 2017-07-26 DIAGNOSIS — Z6839 Body mass index (BMI) 39.0-39.9, adult: Secondary | ICD-10-CM | POA: Diagnosis not present

## 2017-07-26 DIAGNOSIS — Z8781 Personal history of (healed) traumatic fracture: Secondary | ICD-10-CM | POA: Diagnosis not present

## 2017-07-26 DIAGNOSIS — I872 Venous insufficiency (chronic) (peripheral): Secondary | ICD-10-CM | POA: Diagnosis not present

## 2017-07-27 DIAGNOSIS — E1142 Type 2 diabetes mellitus with diabetic polyneuropathy: Secondary | ICD-10-CM | POA: Diagnosis not present

## 2017-07-27 DIAGNOSIS — Z8781 Personal history of (healed) traumatic fracture: Secondary | ICD-10-CM | POA: Diagnosis not present

## 2017-07-27 DIAGNOSIS — I10 Essential (primary) hypertension: Secondary | ICD-10-CM | POA: Diagnosis not present

## 2017-07-27 DIAGNOSIS — M859 Disorder of bone density and structure, unspecified: Secondary | ICD-10-CM | POA: Diagnosis not present

## 2017-07-27 DIAGNOSIS — I872 Venous insufficiency (chronic) (peripheral): Secondary | ICD-10-CM | POA: Diagnosis not present

## 2017-07-27 DIAGNOSIS — Z6839 Body mass index (BMI) 39.0-39.9, adult: Secondary | ICD-10-CM | POA: Diagnosis not present

## 2017-07-27 DIAGNOSIS — I69351 Hemiplegia and hemiparesis following cerebral infarction affecting right dominant side: Secondary | ICD-10-CM | POA: Diagnosis not present

## 2017-07-27 DIAGNOSIS — K219 Gastro-esophageal reflux disease without esophagitis: Secondary | ICD-10-CM | POA: Diagnosis not present

## 2017-07-28 DIAGNOSIS — M859 Disorder of bone density and structure, unspecified: Secondary | ICD-10-CM | POA: Diagnosis not present

## 2017-07-28 DIAGNOSIS — Z8781 Personal history of (healed) traumatic fracture: Secondary | ICD-10-CM | POA: Diagnosis not present

## 2017-07-28 DIAGNOSIS — I69351 Hemiplegia and hemiparesis following cerebral infarction affecting right dominant side: Secondary | ICD-10-CM | POA: Diagnosis not present

## 2017-07-28 DIAGNOSIS — K219 Gastro-esophageal reflux disease without esophagitis: Secondary | ICD-10-CM | POA: Diagnosis not present

## 2017-07-28 DIAGNOSIS — Z6839 Body mass index (BMI) 39.0-39.9, adult: Secondary | ICD-10-CM | POA: Diagnosis not present

## 2017-07-28 DIAGNOSIS — E1142 Type 2 diabetes mellitus with diabetic polyneuropathy: Secondary | ICD-10-CM | POA: Diagnosis not present

## 2017-07-28 DIAGNOSIS — I10 Essential (primary) hypertension: Secondary | ICD-10-CM | POA: Diagnosis not present

## 2017-07-28 DIAGNOSIS — I872 Venous insufficiency (chronic) (peripheral): Secondary | ICD-10-CM | POA: Diagnosis not present

## 2017-07-29 DIAGNOSIS — M859 Disorder of bone density and structure, unspecified: Secondary | ICD-10-CM | POA: Diagnosis not present

## 2017-07-29 DIAGNOSIS — Z6839 Body mass index (BMI) 39.0-39.9, adult: Secondary | ICD-10-CM | POA: Diagnosis not present

## 2017-07-29 DIAGNOSIS — I69351 Hemiplegia and hemiparesis following cerebral infarction affecting right dominant side: Secondary | ICD-10-CM | POA: Diagnosis not present

## 2017-07-29 DIAGNOSIS — I872 Venous insufficiency (chronic) (peripheral): Secondary | ICD-10-CM | POA: Diagnosis not present

## 2017-07-29 DIAGNOSIS — I10 Essential (primary) hypertension: Secondary | ICD-10-CM | POA: Diagnosis not present

## 2017-07-29 DIAGNOSIS — E1142 Type 2 diabetes mellitus with diabetic polyneuropathy: Secondary | ICD-10-CM | POA: Diagnosis not present

## 2017-07-29 DIAGNOSIS — Z8781 Personal history of (healed) traumatic fracture: Secondary | ICD-10-CM | POA: Diagnosis not present

## 2017-07-29 DIAGNOSIS — K219 Gastro-esophageal reflux disease without esophagitis: Secondary | ICD-10-CM | POA: Diagnosis not present

## 2017-07-30 DIAGNOSIS — I872 Venous insufficiency (chronic) (peripheral): Secondary | ICD-10-CM | POA: Diagnosis not present

## 2017-07-30 DIAGNOSIS — E1142 Type 2 diabetes mellitus with diabetic polyneuropathy: Secondary | ICD-10-CM | POA: Diagnosis not present

## 2017-07-30 DIAGNOSIS — M859 Disorder of bone density and structure, unspecified: Secondary | ICD-10-CM | POA: Diagnosis not present

## 2017-07-30 DIAGNOSIS — I10 Essential (primary) hypertension: Secondary | ICD-10-CM | POA: Diagnosis not present

## 2017-07-30 DIAGNOSIS — I69351 Hemiplegia and hemiparesis following cerebral infarction affecting right dominant side: Secondary | ICD-10-CM | POA: Diagnosis not present

## 2017-07-30 DIAGNOSIS — K219 Gastro-esophageal reflux disease without esophagitis: Secondary | ICD-10-CM | POA: Diagnosis not present

## 2017-07-30 DIAGNOSIS — Z6839 Body mass index (BMI) 39.0-39.9, adult: Secondary | ICD-10-CM | POA: Diagnosis not present

## 2017-07-30 DIAGNOSIS — Z8781 Personal history of (healed) traumatic fracture: Secondary | ICD-10-CM | POA: Diagnosis not present

## 2017-08-02 DIAGNOSIS — E1142 Type 2 diabetes mellitus with diabetic polyneuropathy: Secondary | ICD-10-CM | POA: Diagnosis not present

## 2017-08-02 DIAGNOSIS — K219 Gastro-esophageal reflux disease without esophagitis: Secondary | ICD-10-CM | POA: Diagnosis not present

## 2017-08-02 DIAGNOSIS — Z6839 Body mass index (BMI) 39.0-39.9, adult: Secondary | ICD-10-CM | POA: Diagnosis not present

## 2017-08-02 DIAGNOSIS — I69351 Hemiplegia and hemiparesis following cerebral infarction affecting right dominant side: Secondary | ICD-10-CM | POA: Diagnosis not present

## 2017-08-02 DIAGNOSIS — Z8781 Personal history of (healed) traumatic fracture: Secondary | ICD-10-CM | POA: Diagnosis not present

## 2017-08-02 DIAGNOSIS — I10 Essential (primary) hypertension: Secondary | ICD-10-CM | POA: Diagnosis not present

## 2017-08-02 DIAGNOSIS — I872 Venous insufficiency (chronic) (peripheral): Secondary | ICD-10-CM | POA: Diagnosis not present

## 2017-08-02 DIAGNOSIS — M859 Disorder of bone density and structure, unspecified: Secondary | ICD-10-CM | POA: Diagnosis not present

## 2017-08-03 DIAGNOSIS — Z6839 Body mass index (BMI) 39.0-39.9, adult: Secondary | ICD-10-CM | POA: Diagnosis not present

## 2017-08-03 DIAGNOSIS — I69351 Hemiplegia and hemiparesis following cerebral infarction affecting right dominant side: Secondary | ICD-10-CM | POA: Diagnosis not present

## 2017-08-03 DIAGNOSIS — M859 Disorder of bone density and structure, unspecified: Secondary | ICD-10-CM | POA: Diagnosis not present

## 2017-08-03 DIAGNOSIS — Z8781 Personal history of (healed) traumatic fracture: Secondary | ICD-10-CM | POA: Diagnosis not present

## 2017-08-03 DIAGNOSIS — I10 Essential (primary) hypertension: Secondary | ICD-10-CM | POA: Diagnosis not present

## 2017-08-03 DIAGNOSIS — I872 Venous insufficiency (chronic) (peripheral): Secondary | ICD-10-CM | POA: Diagnosis not present

## 2017-08-03 DIAGNOSIS — K219 Gastro-esophageal reflux disease without esophagitis: Secondary | ICD-10-CM | POA: Diagnosis not present

## 2017-08-03 DIAGNOSIS — E1142 Type 2 diabetes mellitus with diabetic polyneuropathy: Secondary | ICD-10-CM | POA: Diagnosis not present

## 2017-08-05 DIAGNOSIS — E1142 Type 2 diabetes mellitus with diabetic polyneuropathy: Secondary | ICD-10-CM | POA: Diagnosis not present

## 2017-08-05 DIAGNOSIS — M859 Disorder of bone density and structure, unspecified: Secondary | ICD-10-CM | POA: Diagnosis not present

## 2017-08-05 DIAGNOSIS — I69351 Hemiplegia and hemiparesis following cerebral infarction affecting right dominant side: Secondary | ICD-10-CM | POA: Diagnosis not present

## 2017-08-05 DIAGNOSIS — K219 Gastro-esophageal reflux disease without esophagitis: Secondary | ICD-10-CM | POA: Diagnosis not present

## 2017-08-05 DIAGNOSIS — Z6839 Body mass index (BMI) 39.0-39.9, adult: Secondary | ICD-10-CM | POA: Diagnosis not present

## 2017-08-05 DIAGNOSIS — I872 Venous insufficiency (chronic) (peripheral): Secondary | ICD-10-CM | POA: Diagnosis not present

## 2017-08-05 DIAGNOSIS — Z8781 Personal history of (healed) traumatic fracture: Secondary | ICD-10-CM | POA: Diagnosis not present

## 2017-08-05 DIAGNOSIS — I10 Essential (primary) hypertension: Secondary | ICD-10-CM | POA: Diagnosis not present

## 2017-08-10 DIAGNOSIS — E1142 Type 2 diabetes mellitus with diabetic polyneuropathy: Secondary | ICD-10-CM | POA: Diagnosis not present

## 2017-08-10 DIAGNOSIS — I10 Essential (primary) hypertension: Secondary | ICD-10-CM | POA: Diagnosis not present

## 2017-08-10 DIAGNOSIS — I872 Venous insufficiency (chronic) (peripheral): Secondary | ICD-10-CM | POA: Diagnosis not present

## 2017-08-10 DIAGNOSIS — K219 Gastro-esophageal reflux disease without esophagitis: Secondary | ICD-10-CM | POA: Diagnosis not present

## 2017-08-10 DIAGNOSIS — Z6839 Body mass index (BMI) 39.0-39.9, adult: Secondary | ICD-10-CM | POA: Diagnosis not present

## 2017-08-10 DIAGNOSIS — Z8781 Personal history of (healed) traumatic fracture: Secondary | ICD-10-CM | POA: Diagnosis not present

## 2017-08-10 DIAGNOSIS — M859 Disorder of bone density and structure, unspecified: Secondary | ICD-10-CM | POA: Diagnosis not present

## 2017-08-10 DIAGNOSIS — I69351 Hemiplegia and hemiparesis following cerebral infarction affecting right dominant side: Secondary | ICD-10-CM | POA: Diagnosis not present

## 2017-08-12 DIAGNOSIS — G8190 Hemiplegia, unspecified affecting unspecified side: Secondary | ICD-10-CM | POA: Diagnosis not present

## 2017-08-12 DIAGNOSIS — M545 Low back pain: Secondary | ICD-10-CM | POA: Diagnosis not present

## 2017-08-12 DIAGNOSIS — I6350 Cerebral infarction due to unspecified occlusion or stenosis of unspecified cerebral artery: Secondary | ICD-10-CM | POA: Diagnosis not present

## 2017-08-12 DIAGNOSIS — I679 Cerebrovascular disease, unspecified: Secondary | ICD-10-CM | POA: Diagnosis not present

## 2017-08-20 DIAGNOSIS — Z6839 Body mass index (BMI) 39.0-39.9, adult: Secondary | ICD-10-CM | POA: Diagnosis not present

## 2017-08-20 DIAGNOSIS — I69351 Hemiplegia and hemiparesis following cerebral infarction affecting right dominant side: Secondary | ICD-10-CM | POA: Diagnosis not present

## 2017-08-20 DIAGNOSIS — I872 Venous insufficiency (chronic) (peripheral): Secondary | ICD-10-CM | POA: Diagnosis not present

## 2017-08-20 DIAGNOSIS — Z8781 Personal history of (healed) traumatic fracture: Secondary | ICD-10-CM | POA: Diagnosis not present

## 2017-08-20 DIAGNOSIS — M859 Disorder of bone density and structure, unspecified: Secondary | ICD-10-CM | POA: Diagnosis not present

## 2017-08-20 DIAGNOSIS — K219 Gastro-esophageal reflux disease without esophagitis: Secondary | ICD-10-CM | POA: Diagnosis not present

## 2017-08-20 DIAGNOSIS — E1142 Type 2 diabetes mellitus with diabetic polyneuropathy: Secondary | ICD-10-CM | POA: Diagnosis not present

## 2017-08-20 DIAGNOSIS — I10 Essential (primary) hypertension: Secondary | ICD-10-CM | POA: Diagnosis not present

## 2017-08-25 DIAGNOSIS — I872 Venous insufficiency (chronic) (peripheral): Secondary | ICD-10-CM | POA: Diagnosis not present

## 2017-08-25 DIAGNOSIS — N183 Chronic kidney disease, stage 3 (moderate): Secondary | ICD-10-CM | POA: Diagnosis not present

## 2017-08-25 DIAGNOSIS — E1165 Type 2 diabetes mellitus with hyperglycemia: Secondary | ICD-10-CM | POA: Diagnosis not present

## 2017-08-25 DIAGNOSIS — I5031 Acute diastolic (congestive) heart failure: Secondary | ICD-10-CM | POA: Diagnosis not present

## 2017-08-25 DIAGNOSIS — I130 Hypertensive heart and chronic kidney disease with heart failure and stage 1 through stage 4 chronic kidney disease, or unspecified chronic kidney disease: Secondary | ICD-10-CM | POA: Diagnosis not present

## 2017-08-25 DIAGNOSIS — E1142 Type 2 diabetes mellitus with diabetic polyneuropathy: Secondary | ICD-10-CM | POA: Diagnosis not present

## 2017-08-25 DIAGNOSIS — I69351 Hemiplegia and hemiparesis following cerebral infarction affecting right dominant side: Secondary | ICD-10-CM | POA: Diagnosis not present

## 2017-08-25 DIAGNOSIS — E1122 Type 2 diabetes mellitus with diabetic chronic kidney disease: Secondary | ICD-10-CM | POA: Diagnosis not present

## 2017-08-30 DIAGNOSIS — N183 Chronic kidney disease, stage 3 (moderate): Secondary | ICD-10-CM | POA: Diagnosis not present

## 2017-08-30 DIAGNOSIS — I69351 Hemiplegia and hemiparesis following cerebral infarction affecting right dominant side: Secondary | ICD-10-CM | POA: Diagnosis not present

## 2017-08-30 DIAGNOSIS — I872 Venous insufficiency (chronic) (peripheral): Secondary | ICD-10-CM | POA: Diagnosis not present

## 2017-08-30 DIAGNOSIS — I130 Hypertensive heart and chronic kidney disease with heart failure and stage 1 through stage 4 chronic kidney disease, or unspecified chronic kidney disease: Secondary | ICD-10-CM | POA: Diagnosis not present

## 2017-08-30 DIAGNOSIS — E1122 Type 2 diabetes mellitus with diabetic chronic kidney disease: Secondary | ICD-10-CM | POA: Diagnosis not present

## 2017-08-30 DIAGNOSIS — I5031 Acute diastolic (congestive) heart failure: Secondary | ICD-10-CM | POA: Diagnosis not present

## 2017-08-30 DIAGNOSIS — E1165 Type 2 diabetes mellitus with hyperglycemia: Secondary | ICD-10-CM | POA: Diagnosis not present

## 2017-08-30 DIAGNOSIS — E1142 Type 2 diabetes mellitus with diabetic polyneuropathy: Secondary | ICD-10-CM | POA: Diagnosis not present

## 2017-09-01 ENCOUNTER — Encounter: Payer: Self-pay | Admitting: Cardiovascular Disease

## 2017-09-01 ENCOUNTER — Ambulatory Visit (INDEPENDENT_AMBULATORY_CARE_PROVIDER_SITE_OTHER): Payer: Medicare HMO | Admitting: Cardiovascular Disease

## 2017-09-01 VITALS — BP 144/78 | HR 92 | Ht 64.0 in | Wt 226.0 lb

## 2017-09-01 DIAGNOSIS — N183 Chronic kidney disease, stage 3 unspecified: Secondary | ICD-10-CM

## 2017-09-01 DIAGNOSIS — I10 Essential (primary) hypertension: Secondary | ICD-10-CM

## 2017-09-01 DIAGNOSIS — Z9289 Personal history of other medical treatment: Secondary | ICD-10-CM | POA: Diagnosis not present

## 2017-09-01 DIAGNOSIS — I447 Left bundle-branch block, unspecified: Secondary | ICD-10-CM

## 2017-09-01 DIAGNOSIS — I5032 Chronic diastolic (congestive) heart failure: Secondary | ICD-10-CM

## 2017-09-01 NOTE — Patient Instructions (Signed)
Your physician recommends that you schedule a follow-up appointment in: 3 months with [facility]    Your physician recommends that you continue on your current medications as directed. Please refer to the Current Medication list given to you today.    If you need a refill on your cardiac medications before your next appointment, please call your pharmacy.     No lab work or tests ordered today.     Thank you for choosing [facility] !

## 2017-09-01 NOTE — Progress Notes (Addendum)
CARDIOLOGY CONSULT NOTE  Patient ID: ROYALTI SCHAUF MRN: [id number] DOB/AGE: 1935/04/07 83 y.o.  Admit date: (Not on file) Primary Physician: [name], MD Referring Physician: Dr. Legrand Rams  Reason for Consultation: Diastolic heart failure  HPI: [name] is a 83 y.o. female who is being seen today for the evaluation of diastolic heart failure at the request of [name], MD.   She was hospitalized for acute hypoxic respiratory failure secondary to acute diastolic heart failure in February 2019. I have personally reviewed all documentation, labs, radiographic and cardiovascular studies, and independently interpreted all ECG's.  Admission BNP was only mildly elevated at 149.  Chest x-ray showed interstitial edema and she had lower extremity edema with abdominal distention.  She was given IV Lasix.  Echocardiogram demonstrated vigorous left ventricular systolic function, LVEF 83-15%, normal regional wall motion, and grade 2 diastolic dysfunction with high ventricular filling pressures.  She diuresed approximately 14 pounds.  She was transitioned to oral Lasix 40 mg twice daily.  Past medical history also includes chronic kidney disease stage III, poorly controlled type 2 diabetes mellitus, hypertension and GERD.  Labs on 07/14/17: BUN 42, creatinine 1.6, hemoglobin 11.3.  I personally reviewed the ECG performed on 07/08/17 which showed sinus rhythm with a left bundle branch block.  This was also noted on ECG on 05/02/15.  The patient denies any symptoms of chest pain, shortness of breath, lightheadedness, dizziness, leg swelling, orthopnea, PND, and syncope. She very seldom has palpitations.  She is wearing compression stockings and now uses them regularly.  She tries to avoid adding salt to foods.  She is here with her daughter.    No Known Allergies  Current Outpatient Medications  Medication Sig Dispense Refill  . fluticasone (FLONASE) 50 MCG/ACT nasal spray  Place 1 spray into both nostrils daily. 16 g 0  . furosemide (LASIX) 40 MG tablet Take 1 tablet (40 mg total) by mouth 2 (two) times daily. 60 tablet 0  . gabapentin (NEURONTIN) 300 MG capsule Take 300 mg by mouth 2 (two) times daily.     . hydrALAZINE (APRESOLINE) 25 MG tablet Take 1 tablet (25 mg total) by mouth 2 (two) times daily. 60 tablet 0  . HYDROcodone-homatropine (HYCODAN) 5-1.5 MG/5ML syrup Take 5 mLs by mouth 3 (three) times daily as needed for cough. 120 mL 0  . hydrocortisone (ANUSOL-HC) 2.5 % rectal cream Place 1 application rectally 2 (two) times daily. (Patient taking differently: Place 1 application rectally as needed. ) 30 g 1  . insulin aspart (NOVOLOG) 100 UNIT/ML injection Inject 20 Units into the skin 3 (three) times daily before meals.    . insulin detemir (LEVEMIR) 100 UNIT/ML injection Inject 100 Units into the skin at bedtime.     Marland Kitchen loratadine (CLARITIN) 10 MG tablet Take 1 tablet (10 mg total) by mouth daily. 30 tablet 0  . lubiprostone (AMITIZA) 8 MCG capsule Take 8 mcg by mouth 2 (two) times daily as needed for constipation.    . pantoprazole (PROTONIX) 40 MG tablet TAKE 1 TABLET BY MOUTH ONCE DAILY. 90 tablet 3  . potassium chloride SA (K-DUR,KLOR-CON) 10 MEQ tablet Take 1 tablet (10 mEq total) by mouth daily. 30 tablet 0  . simvastatin (ZOCOR) 20 MG tablet Take 20 mg by mouth daily.     No current facility-administered medications for this visit.     Past Medical History:  Diagnosis Date  . Arthritis   . CVA (cerebral infarction)   .  Diabetes mellitus without complication ([place])   . GERD (gastroesophageal reflux disease)   . Hypertension   . Stroke [facility])     Past Surgical History:  Procedure Laterality Date  . BACK SURGERY    . CATARACT EXTRACTION W/PHACO Left 02/28/2013   Procedure: CATARACT EXTRACTION PHACO AND INTRAOCULAR LENS PLACEMENT (IOL) CDE=15.60;  Surgeon: Elta Guadeloupe T. Gershon Crane, MD;  Location: [facility] ORS;  Service: Ophthalmology;  Laterality: Left;  .  CHOLECYSTECTOMY    . COLONOSCOPY  12/12/2003   RMR: Normal rectum.  Long capacious tortuous colon, colonic mucosa appeared normal  . COLONOSCOPY N/A 03/21/2014   Dr. [name]: internal and external hemorrhoids, torturous colon, colonic diverticulosis   . ESOPHAGOGASTRODUODENOSCOPY  12/28/2001   CBJ:SEGBT sliding hiatal hernia/. Three small bulbar ulcers, two with stigmata of bleeding and these were coagulated using heater probe unit   . ESOPHAGOGASTRODUODENOSCOPY N/A 03/21/2014   Dr. [name]: cervical esophageal web s/p dilation, negative H.pylori   . EYE SURGERY    . HIP FRACTURE SURGERY  2008  . JOINT REPLACEMENT     Rt hip, ????? sounds like just for fracture  . MALONEY DILATION N/A 03/21/2014   Procedure: [name] DILATION;  Surgeon: [name], MD;  Location: [facility] ENDO SUITE;  Service: Endoscopy;  Laterality: N/A;  . SAVORY DILATION N/A 03/21/2014   Procedure: SAVORY DILATION;  Surgeon: [name], MD;  Location: [facility] ENDO SUITE;  Service: Endoscopy;  Laterality: N/A;    Social History   Socioeconomic History  . Marital status: Widowed    Spouse name: Not on file  . Number of children: 8  . Years of education: Not on file  . Highest education level: Not on file  Occupational History  . Not on file  Social Needs  . Financial resource strain: Not on file  . Food insecurity:    Worry: Not on file    Inability: Not on file  . Transportation needs:    Medical: Not on file    Non-medical: Not on file  Tobacco Use  . Smoking status: Never Smoker  . Smokeless tobacco: Never Used  Substance and Sexual Activity  . Alcohol use: No  . Drug use: No  . Sexual activity: Not on file  Lifestyle  . Physical activity:    Days per week: Not on file    Minutes per session: Not on file  . Stress: Not on file  Relationships  . Social connections:    Talks on phone: Not on file    Gets together: Not on file    Attends religious service: Not on file    Active member of club or  organization: Not on file    Attends meetings of clubs or organizations: Not on file    Relationship status: Not on file  . Intimate partner violence:    Fear of current or ex partner: Not on file    Emotionally abused: Not on file    Physically abused: Not on file    Forced sexual activity: Not on file  Other Topics Concern  . Not on file  Social History Narrative  . Not on file     No family history of premature CAD in 1st degree relatives.  Current Meds  Medication Sig  . fluticasone (FLONASE) 50 MCG/ACT nasal spray Place 1 spray into both nostrils daily.  . furosemide (LASIX) 40 MG tablet Take 1 tablet (40 mg total) by mouth 2 (two) times daily.  Marland Kitchen gabapentin (NEURONTIN) 300 MG capsule Take  300 mg by mouth 2 (two) times daily.   . hydrALAZINE (APRESOLINE) 25 MG tablet Take 1 tablet (25 mg total) by mouth 2 (two) times daily.  Marland Kitchen HYDROcodone-homatropine (HYCODAN) 5-1.5 MG/5ML syrup Take 5 mLs by mouth 3 (three) times daily as needed for cough.  . hydrocortisone (ANUSOL-HC) 2.5 % rectal cream Place 1 application rectally 2 (two) times daily. (Patient taking differently: Place 1 application rectally as needed. )  . insulin aspart (NOVOLOG) 100 UNIT/ML injection Inject 20 Units into the skin 3 (three) times daily before meals.  . insulin detemir (LEVEMIR) 100 UNIT/ML injection Inject 100 Units into the skin at bedtime.   Marland Kitchen loratadine (CLARITIN) 10 MG tablet Take 1 tablet (10 mg total) by mouth daily.  Marland Kitchen lubiprostone (AMITIZA) 8 MCG capsule Take 8 mcg by mouth 2 (two) times daily as needed for constipation.  . pantoprazole (PROTONIX) 40 MG tablet TAKE 1 TABLET BY MOUTH ONCE DAILY.  Marland Kitchen potassium chloride SA (K-DUR,KLOR-CON) 10 MEQ tablet Take 1 tablet (10 mEq total) by mouth daily.  . simvastatin (ZOCOR) 20 MG tablet Take 20 mg by mouth daily.      Review of systems complete and found to be negative unless listed above in HPI    Physical exam Blood pressure (!) 144/78, pulse 92,  height 5\' 4"  (1.626 m), weight 226 lb (102.5 kg), SpO2 95 %. General: NAD Neck: No JVD, no thyromegaly or thyroid nodule.  Lungs: Clear to auscultation bilaterally with normal respiratory effort. CV: Nondisplaced PMI. Regular rate and rhythm, normal S1/S2, no S3/S4, no murmur.  Wearing compression stockings.  Trace lower extremity edema bilaterally.  No carotid bruit.  Abdomen: Soft, nontender, no distention.  Skin: Intact without lesions or rashes.  Neurologic: Alert and oriented x 3.  Psych: Normal affect. Extremities: No clubbing or cyanosis.  HEENT: Normal.   ECG: Most recent ECG reviewed.   Labs: Lab Results  Component Value Date/Time   K 4.2 07/14/2017 05:55 AM   BUN 42 (H) 07/14/2017 05:55 AM   CREATININE 1.60 (H) 07/14/2017 05:55 AM   ALT 14 07/08/2017 03:31 PM   HGB 11.3 (L) 07/14/2017 05:55 AM     Lipids: No results found for: LDLCALC, LDLDIRECT, CHOL, TRIG, HDL      ASSESSMENT AND PLAN:   1.  Chronic diastolic heart failure: She weighed 225 pounds on 2/26.  She weighs 226 pounds today.  Euvolemic and stable.  Continue Lasix 40 mg twice daily with supplemental potassium.  I spoke to her and her daughter about the importance of a low-sodium diet.  2.  Hypertension: Blood pressure is mildly elevated.  This will require further monitoring.  3.  Left bundle branch block: Chronic and also seen in December 2016.  4.  Chronic kidney disease stage III: Stable.  Labs reviewed above.    Disposition: Follow up in 3 months  Signed: Kate Sable, M.D., F.A.C.C.  09/01/2017, 1:50 PM

## 2017-09-06 DIAGNOSIS — I872 Venous insufficiency (chronic) (peripheral): Secondary | ICD-10-CM | POA: Diagnosis not present

## 2017-09-06 DIAGNOSIS — I130 Hypertensive heart and chronic kidney disease with heart failure and stage 1 through stage 4 chronic kidney disease, or unspecified chronic kidney disease: Secondary | ICD-10-CM | POA: Diagnosis not present

## 2017-09-06 DIAGNOSIS — E1142 Type 2 diabetes mellitus with diabetic polyneuropathy: Secondary | ICD-10-CM | POA: Diagnosis not present

## 2017-09-06 DIAGNOSIS — E1122 Type 2 diabetes mellitus with diabetic chronic kidney disease: Secondary | ICD-10-CM | POA: Diagnosis not present

## 2017-09-06 DIAGNOSIS — E1165 Type 2 diabetes mellitus with hyperglycemia: Secondary | ICD-10-CM | POA: Diagnosis not present

## 2017-09-06 DIAGNOSIS — N183 Chronic kidney disease, stage 3 (moderate): Secondary | ICD-10-CM | POA: Diagnosis not present

## 2017-09-06 DIAGNOSIS — I5031 Acute diastolic (congestive) heart failure: Secondary | ICD-10-CM | POA: Diagnosis not present

## 2017-09-06 DIAGNOSIS — I69351 Hemiplegia and hemiparesis following cerebral infarction affecting right dominant side: Secondary | ICD-10-CM | POA: Diagnosis not present

## 2017-09-12 DIAGNOSIS — G8190 Hemiplegia, unspecified affecting unspecified side: Secondary | ICD-10-CM | POA: Diagnosis not present

## 2017-09-12 DIAGNOSIS — M545 Low back pain: Secondary | ICD-10-CM | POA: Diagnosis not present

## 2017-09-12 DIAGNOSIS — I6350 Cerebral infarction due to unspecified occlusion or stenosis of unspecified cerebral artery: Secondary | ICD-10-CM | POA: Diagnosis not present

## 2017-09-12 DIAGNOSIS — I679 Cerebrovascular disease, unspecified: Secondary | ICD-10-CM | POA: Diagnosis not present

## 2017-09-13 DIAGNOSIS — E1165 Type 2 diabetes mellitus with hyperglycemia: Secondary | ICD-10-CM | POA: Diagnosis not present

## 2017-09-13 DIAGNOSIS — I69351 Hemiplegia and hemiparesis following cerebral infarction affecting right dominant side: Secondary | ICD-10-CM | POA: Diagnosis not present

## 2017-09-13 DIAGNOSIS — I130 Hypertensive heart and chronic kidney disease with heart failure and stage 1 through stage 4 chronic kidney disease, or unspecified chronic kidney disease: Secondary | ICD-10-CM | POA: Diagnosis not present

## 2017-09-13 DIAGNOSIS — I5031 Acute diastolic (congestive) heart failure: Secondary | ICD-10-CM | POA: Diagnosis not present

## 2017-09-13 DIAGNOSIS — E1142 Type 2 diabetes mellitus with diabetic polyneuropathy: Secondary | ICD-10-CM | POA: Diagnosis not present

## 2017-09-13 DIAGNOSIS — I872 Venous insufficiency (chronic) (peripheral): Secondary | ICD-10-CM | POA: Diagnosis not present

## 2017-09-13 DIAGNOSIS — N183 Chronic kidney disease, stage 3 (moderate): Secondary | ICD-10-CM | POA: Diagnosis not present

## 2017-09-13 DIAGNOSIS — E1122 Type 2 diabetes mellitus with diabetic chronic kidney disease: Secondary | ICD-10-CM | POA: Diagnosis not present

## 2017-09-17 DIAGNOSIS — E1142 Type 2 diabetes mellitus with diabetic polyneuropathy: Secondary | ICD-10-CM | POA: Diagnosis not present

## 2017-09-21 DIAGNOSIS — I5031 Acute diastolic (congestive) heart failure: Secondary | ICD-10-CM | POA: Diagnosis not present

## 2017-09-21 DIAGNOSIS — I69351 Hemiplegia and hemiparesis following cerebral infarction affecting right dominant side: Secondary | ICD-10-CM | POA: Diagnosis not present

## 2017-09-21 DIAGNOSIS — E1165 Type 2 diabetes mellitus with hyperglycemia: Secondary | ICD-10-CM | POA: Diagnosis not present

## 2017-09-21 DIAGNOSIS — E1142 Type 2 diabetes mellitus with diabetic polyneuropathy: Secondary | ICD-10-CM | POA: Diagnosis not present

## 2017-09-21 DIAGNOSIS — I872 Venous insufficiency (chronic) (peripheral): Secondary | ICD-10-CM | POA: Diagnosis not present

## 2017-09-21 DIAGNOSIS — N183 Chronic kidney disease, stage 3 (moderate): Secondary | ICD-10-CM | POA: Diagnosis not present

## 2017-09-21 DIAGNOSIS — E1122 Type 2 diabetes mellitus with diabetic chronic kidney disease: Secondary | ICD-10-CM | POA: Diagnosis not present

## 2017-09-21 DIAGNOSIS — I130 Hypertensive heart and chronic kidney disease with heart failure and stage 1 through stage 4 chronic kidney disease, or unspecified chronic kidney disease: Secondary | ICD-10-CM | POA: Diagnosis not present

## 2017-10-05 DIAGNOSIS — I5031 Acute diastolic (congestive) heart failure: Secondary | ICD-10-CM | POA: Diagnosis not present

## 2017-10-05 DIAGNOSIS — I872 Venous insufficiency (chronic) (peripheral): Secondary | ICD-10-CM | POA: Diagnosis not present

## 2017-10-05 DIAGNOSIS — N183 Chronic kidney disease, stage 3 (moderate): Secondary | ICD-10-CM | POA: Diagnosis not present

## 2017-10-05 DIAGNOSIS — I130 Hypertensive heart and chronic kidney disease with heart failure and stage 1 through stage 4 chronic kidney disease, or unspecified chronic kidney disease: Secondary | ICD-10-CM | POA: Diagnosis not present

## 2017-10-05 DIAGNOSIS — E1142 Type 2 diabetes mellitus with diabetic polyneuropathy: Secondary | ICD-10-CM | POA: Diagnosis not present

## 2017-10-05 DIAGNOSIS — E1122 Type 2 diabetes mellitus with diabetic chronic kidney disease: Secondary | ICD-10-CM | POA: Diagnosis not present

## 2017-10-05 DIAGNOSIS — I69351 Hemiplegia and hemiparesis following cerebral infarction affecting right dominant side: Secondary | ICD-10-CM | POA: Diagnosis not present

## 2017-10-05 DIAGNOSIS — E1165 Type 2 diabetes mellitus with hyperglycemia: Secondary | ICD-10-CM | POA: Diagnosis not present

## 2017-10-12 DIAGNOSIS — I679 Cerebrovascular disease, unspecified: Secondary | ICD-10-CM | POA: Diagnosis not present

## 2017-10-12 DIAGNOSIS — G8190 Hemiplegia, unspecified affecting unspecified side: Secondary | ICD-10-CM | POA: Diagnosis not present

## 2017-10-12 DIAGNOSIS — M545 Low back pain: Secondary | ICD-10-CM | POA: Diagnosis not present

## 2017-10-12 DIAGNOSIS — I6350 Cerebral infarction due to unspecified occlusion or stenosis of unspecified cerebral artery: Secondary | ICD-10-CM | POA: Diagnosis not present

## 2017-10-19 ENCOUNTER — Other Ambulatory Visit: Payer: Self-pay | Admitting: Cardiovascular Disease

## 2017-10-19 MED ORDER — POTASSIUM CHLORIDE CRYS ER 10 MEQ PO TBCR: 10.0000 meq | EXTENDED_RELEASE_TABLET | Freq: Every day | ORAL | 3 refills | Status: DC

## 2017-10-19 NOTE — Telephone Encounter (Signed)
Needing a refill for her potassium chloride SA (K-DUR,KLOR-CON) 10 MEQ tablet [[id number]]  Sent to [facility]. She was given this while in the hospital and she's almost out

## 2017-10-19 NOTE — Telephone Encounter (Signed)
Refill complete 

## 2017-10-20 DIAGNOSIS — E1165 Type 2 diabetes mellitus with hyperglycemia: Secondary | ICD-10-CM | POA: Diagnosis not present

## 2017-10-20 DIAGNOSIS — I5031 Acute diastolic (congestive) heart failure: Secondary | ICD-10-CM | POA: Diagnosis not present

## 2017-10-20 DIAGNOSIS — I872 Venous insufficiency (chronic) (peripheral): Secondary | ICD-10-CM | POA: Diagnosis not present

## 2017-10-20 DIAGNOSIS — N183 Chronic kidney disease, stage 3 (moderate): Secondary | ICD-10-CM | POA: Diagnosis not present

## 2017-10-20 DIAGNOSIS — E1142 Type 2 diabetes mellitus with diabetic polyneuropathy: Secondary | ICD-10-CM | POA: Diagnosis not present

## 2017-10-20 DIAGNOSIS — I69351 Hemiplegia and hemiparesis following cerebral infarction affecting right dominant side: Secondary | ICD-10-CM | POA: Diagnosis not present

## 2017-10-20 DIAGNOSIS — E1122 Type 2 diabetes mellitus with diabetic chronic kidney disease: Secondary | ICD-10-CM | POA: Diagnosis not present

## 2017-10-20 DIAGNOSIS — I130 Hypertensive heart and chronic kidney disease with heart failure and stage 1 through stage 4 chronic kidney disease, or unspecified chronic kidney disease: Secondary | ICD-10-CM | POA: Diagnosis not present

## 2017-10-28 DIAGNOSIS — E1165 Type 2 diabetes mellitus with hyperglycemia: Secondary | ICD-10-CM | POA: Diagnosis not present

## 2017-10-28 DIAGNOSIS — I5031 Acute diastolic (congestive) heart failure: Secondary | ICD-10-CM | POA: Diagnosis not present

## 2017-10-28 DIAGNOSIS — I10 Essential (primary) hypertension: Secondary | ICD-10-CM | POA: Diagnosis not present

## 2017-11-02 ENCOUNTER — Encounter: Payer: Self-pay | Admitting: Cardiovascular Disease

## 2017-11-12 DIAGNOSIS — G8190 Hemiplegia, unspecified affecting unspecified side: Secondary | ICD-10-CM | POA: Diagnosis not present

## 2017-11-12 DIAGNOSIS — I6350 Cerebral infarction due to unspecified occlusion or stenosis of unspecified cerebral artery: Secondary | ICD-10-CM | POA: Diagnosis not present

## 2017-11-12 DIAGNOSIS — I679 Cerebrovascular disease, unspecified: Secondary | ICD-10-CM | POA: Diagnosis not present

## 2017-11-12 DIAGNOSIS — M545 Low back pain: Secondary | ICD-10-CM | POA: Diagnosis not present

## 2017-12-12 DIAGNOSIS — M545 Low back pain: Secondary | ICD-10-CM | POA: Diagnosis not present

## 2017-12-12 DIAGNOSIS — I6350 Cerebral infarction due to unspecified occlusion or stenosis of unspecified cerebral artery: Secondary | ICD-10-CM | POA: Diagnosis not present

## 2017-12-12 DIAGNOSIS — I679 Cerebrovascular disease, unspecified: Secondary | ICD-10-CM | POA: Diagnosis not present

## 2017-12-12 DIAGNOSIS — G8190 Hemiplegia, unspecified affecting unspecified side: Secondary | ICD-10-CM | POA: Diagnosis not present

## 2017-12-13 ENCOUNTER — Ambulatory Visit: Payer: Medicare HMO | Admitting: Cardiovascular Disease

## 2018-01-05 ENCOUNTER — Encounter: Payer: Self-pay | Admitting: Cardiovascular Disease

## 2018-01-05 ENCOUNTER — Ambulatory Visit (INDEPENDENT_AMBULATORY_CARE_PROVIDER_SITE_OTHER): Payer: Medicare HMO | Admitting: Cardiovascular Disease

## 2018-01-05 VITALS — BP 174/84 | HR 98 | Ht 66.0 in | Wt 236.0 lb

## 2018-01-05 DIAGNOSIS — N183 Chronic kidney disease, stage 3 unspecified: Secondary | ICD-10-CM

## 2018-01-05 DIAGNOSIS — I447 Left bundle-branch block, unspecified: Secondary | ICD-10-CM | POA: Diagnosis not present

## 2018-01-05 DIAGNOSIS — I10 Essential (primary) hypertension: Secondary | ICD-10-CM

## 2018-01-05 DIAGNOSIS — Z9114 Patient's other noncompliance with medication regimen: Secondary | ICD-10-CM | POA: Diagnosis not present

## 2018-01-05 DIAGNOSIS — I5033 Acute on chronic diastolic (congestive) heart failure: Secondary | ICD-10-CM

## 2018-01-05 NOTE — Patient Instructions (Signed)
Medication Instructions:  Your physician recommends that you continue on your current medications as directed. Please refer to the Current Medication list given to you today.   Labwork: NONE  Testing/Procedures: NONE  Follow-Up: Your physician recommends that you schedule a follow-up appointment in: December 2019    Any Other Special Instructions Will Be Listed Below (If Applicable).     If you need a refill on your cardiac medications before your next appointment, please call your pharmacy.

## 2018-01-05 NOTE — Progress Notes (Signed)
SUBJECTIVE: The patient presents for follow-up of chronic diastolic heart failure.  Her weight is up to 236 pounds and she weighed 226 pounds at her last visit with me on 09/01/2017.  She is here with her daughter, Louretta Shorten, who works at [employer].  She sets out her mother's medications.  We had a long discussion together.  It appears the patient has been taking Lasix 40 mg daily rather than twice daily and sometimes not taking it at all.  Her leg edema has gotten worse.  She is slightly more dyspneic with exertion.  She denies chest pain.  She does say she takes hydralazine twice daily.    Review of Systems: As per "subjective", otherwise negative.  No Known Allergies  Current Outpatient Medications  Medication Sig Dispense Refill  . fluticasone (FLONASE) 50 MCG/ACT nasal spray Place 1 spray into both nostrils daily. 16 g 0  . furosemide (LASIX) 40 MG tablet Take 1 tablet (40 mg total) by mouth 2 (two) times daily. 60 tablet 0  . gabapentin (NEURONTIN) 300 MG capsule Take 300 mg by mouth 2 (two) times daily.     . hydrALAZINE (APRESOLINE) 25 MG tablet Take 1 tablet (25 mg total) by mouth 2 (two) times daily. 60 tablet 0  . HYDROcodone-homatropine (HYCODAN) 5-1.5 MG/5ML syrup Take 5 mLs by mouth 3 (three) times daily as needed for cough. 120 mL 0  . hydrocortisone (ANUSOL-HC) 2.5 % rectal cream Place 1 application rectally 2 (two) times daily. (Patient taking differently: Place 1 application rectally as needed. ) 30 g 1  . insulin aspart (NOVOLOG) 100 UNIT/ML injection Inject 20 Units into the skin 3 (three) times daily before meals.    . insulin detemir (LEVEMIR) 100 UNIT/ML injection Inject 100 Units into the skin at bedtime.     Marland Kitchen loratadine (CLARITIN) 10 MG tablet Take 1 tablet (10 mg total) by mouth daily. 30 tablet 0  . lubiprostone (AMITIZA) 8 MCG capsule Take 8 mcg by mouth 2 (two) times daily as needed for constipation.    . pantoprazole (PROTONIX) 40 MG tablet TAKE 1 TABLET BY  MOUTH ONCE DAILY. 90 tablet 3  . potassium chloride (K-DUR,KLOR-CON) 10 MEQ tablet Take 1 tablet (10 mEq total) by mouth daily. 90 tablet 3  . simvastatin (ZOCOR) 20 MG tablet Take 20 mg by mouth daily.     No current facility-administered medications for this visit.     Past Medical History:  Diagnosis Date  . Arthritis   . CVA (cerebral infarction)   . Diabetes mellitus without complication ([place])   . GERD (gastroesophageal reflux disease)   . Hypertension   . Stroke [facility])     Past Surgical History:  Procedure Laterality Date  . BACK SURGERY    . CATARACT EXTRACTION W/PHACO Left 02/28/2013   Procedure: CATARACT EXTRACTION PHACO AND INTRAOCULAR LENS PLACEMENT (IOL) CDE=15.60;  Surgeon: Elta Guadeloupe T. Gershon Crane, MD;  Location: [facility] ORS;  Service: Ophthalmology;  Laterality: Left;  . CHOLECYSTECTOMY    . COLONOSCOPY  12/12/2003   RMR: Normal rectum.  Long capacious tortuous colon, colonic mucosa appeared normal  . COLONOSCOPY N/A 03/21/2014   Dr. [name]: internal and external hemorrhoids, torturous colon, colonic diverticulosis   . ESOPHAGOGASTRODUODENOSCOPY  12/28/2001   WUJ:[id number] sliding hiatal hernia/. Three small bulbar ulcers, two with stigmata of bleeding and these were coagulated using heater probe unit   . ESOPHAGOGASTRODUODENOSCOPY N/A 03/21/2014   Dr. [name]: cervical esophageal web s/p dilation, negative H.pylori   .  EYE SURGERY    . HIP FRACTURE SURGERY  2008  . JOINT REPLACEMENT     Rt hip, ????? sounds like just for fracture  . MALONEY DILATION N/A 03/21/2014   Procedure: [name] DILATION;  Surgeon: [name], MD;  Location: [facility] SU[facility];  Service: Endoscopy;  Laterality: N/A;  . SAVORY DILATION N/A 03/21/2014   Procedure: SAVORY DILATION;  Surgeon: [name], MD;  Location: [facility] END[facility] SU[facility];  Service: Endoscopy;  Laterality: N/A;    Social History   Socioeconomic History  . Marital status: Widowed    Spouse name: Not on file  . Number of children: 8  . Years of  education: Not on file  . Highest education level: Not on file  Occupational History  . Not on file  Social Needs  . Financial resource strain: Not on file  . Food insecurity:    Worry: Not on file    Inability: Not on file  . Transportation needs:    Medical: Not on file    Non-medical: Not on file  Tobacco Use  . Smoking status: Never Smoker  . Smokeless tobacco: Never Used  Substance and Sexual Activity  . Alcohol use: No  . Drug use: No  . Sexual activity: Not on file  Lifestyle  . Physical activity:    Days per week: Not on file    Minutes per session: Not on file  . Stress: Not on file  Relationships  . Social connections:    Talks on phone: Not on file    Gets together: Not on file    Attends religious service: Not on file    Active member of club or organization: Not on file    Attends meetings of clubs or organizations: Not on file    Relationship status: Not on file  . Intimate partner violence:    Fear of current or ex partner: Not on file    Emotionally abused: Not on file    Physically abused: Not on file    Forced sexual activity: Not on file  Other Topics Concern  . Not on file  Social History Narrative  . Not on file     Vitals:   01/05/18 1519  BP: (!) 174/84  Pulse: 98  SpO2: 94%  Weight: 236 lb (107 kg)  Height: 5\' 6"  (1.676 m)    Wt Readings from Last 3 Encounters:  01/05/18 236 lb (107 kg)  09/01/17 226 lb (102.5 kg)  07/14/17 224 lb 13.9 oz (102 kg)     PHYSICAL EXAM General: NAD HEENT: Normal. Neck: No JVD, no thyromegaly. Lungs: Clear to auscultation bilaterally with normal respiratory effort. CV: Regular rate and rhythm, normal S1/S2, no S3/S4, no murmur.  1+ pitting bilateral lower extremity edema.  No carotid bruit.   Abdomen: Soft, nontender, no distention.  Neurologic: Alert and oriented.  Psych: Normal affect. Skin: Normal. Musculoskeletal: No gross deformities.    ECG: Reviewed above under  Subjective   Labs: Lab Results  Component Value Date/Time   K 4.2 07/14/2017 05:55 AM   BUN 42 (H) 07/14/2017 05:55 AM   CREATININE 1.60 (H) 07/14/2017 05:55 AM   ALT 14 07/08/2017 03:31 PM   HGB 11.3 (L) 07/14/2017 05:55 AM     Lipids: No results found for: LDLCALC, LDLDIRECT, CHOL, TRIG, HDL     ASSESSMENT AND PLAN: 1.    Acute on chronic diastolic heart failure: She weighed 226 pounds on 09/01/2017 and weighs 236 pounds today.  This is due to medication noncompliance as she has been taking Lasix 40 mg daily and sometimes not at all.  Her daughter and I reinforced the importance of taking Lasix 40 mg twice daily.  She has agreed to do so.  2.  Hypertension: Blood pressure remains elevated.  This will need continued monitoring given increase in diuretic dosage.  If it remains elevated, I will increase hydralazine to 50 mg twice daily.  3.  Left bundle branch block: Chronic and also seen in December 2016.  4.  Chronic kidney disease stage III: Stable.  Basic metabolic panel to be checked in near future.    Disposition: Follow up December 2019   Kate Sable, M.D., F.A.C.C.

## 2018-01-12 DIAGNOSIS — G8190 Hemiplegia, unspecified affecting unspecified side: Secondary | ICD-10-CM | POA: Diagnosis not present

## 2018-01-12 DIAGNOSIS — I679 Cerebrovascular disease, unspecified: Secondary | ICD-10-CM | POA: Diagnosis not present

## 2018-01-12 DIAGNOSIS — M545 Low back pain: Secondary | ICD-10-CM | POA: Diagnosis not present

## 2018-01-12 DIAGNOSIS — I6350 Cerebral infarction due to unspecified occlusion or stenosis of unspecified cerebral artery: Secondary | ICD-10-CM | POA: Diagnosis not present

## 2018-01-27 DIAGNOSIS — Z0000 Encounter for general adult medical examination without abnormal findings: Secondary | ICD-10-CM | POA: Diagnosis not present

## 2018-01-27 DIAGNOSIS — G8190 Hemiplegia, unspecified affecting unspecified side: Secondary | ICD-10-CM | POA: Diagnosis not present

## 2018-01-27 DIAGNOSIS — Z1331 Encounter for screening for depression: Secondary | ICD-10-CM | POA: Diagnosis not present

## 2018-01-27 DIAGNOSIS — I10 Essential (primary) hypertension: Secondary | ICD-10-CM | POA: Diagnosis not present

## 2018-01-27 DIAGNOSIS — K219 Gastro-esophageal reflux disease without esophagitis: Secondary | ICD-10-CM | POA: Diagnosis not present

## 2018-01-27 DIAGNOSIS — I872 Venous insufficiency (chronic) (peripheral): Secondary | ICD-10-CM | POA: Diagnosis not present

## 2018-01-27 DIAGNOSIS — Z1389 Encounter for screening for other disorder: Secondary | ICD-10-CM | POA: Diagnosis not present

## 2018-01-27 DIAGNOSIS — I6350 Cerebral infarction due to unspecified occlusion or stenosis of unspecified cerebral artery: Secondary | ICD-10-CM | POA: Diagnosis not present

## 2018-01-27 DIAGNOSIS — E1142 Type 2 diabetes mellitus with diabetic polyneuropathy: Secondary | ICD-10-CM | POA: Diagnosis not present

## 2018-01-27 DIAGNOSIS — H1045 Other chronic allergic conjunctivitis: Secondary | ICD-10-CM | POA: Diagnosis not present

## 2018-01-27 DIAGNOSIS — Z0001 Encounter for general adult medical examination with abnormal findings: Secondary | ICD-10-CM | POA: Diagnosis not present

## 2018-01-28 ENCOUNTER — Other Ambulatory Visit (HOSPITAL_COMMUNITY): Payer: Self-pay | Admitting: Internal Medicine

## 2018-01-28 DIAGNOSIS — N183 Chronic kidney disease, stage 3 unspecified: Secondary | ICD-10-CM

## 2018-02-03 ENCOUNTER — Ambulatory Visit (HOSPITAL_COMMUNITY)
Admission: RE | Admit: 2018-02-03 | Discharge: 2018-02-03 | Disposition: A | Discharge status: Home / Self Care | Payer: Medicare HMO | Source: Ambulatory Visit | Attending: Internal Medicine | Admitting: Internal Medicine

## 2018-02-03 DIAGNOSIS — N183 Chronic kidney disease, stage 3 unspecified: Secondary | ICD-10-CM

## 2018-02-03 DIAGNOSIS — N2889 Other specified disorders of kidney and ureter: Secondary | ICD-10-CM | POA: Diagnosis not present

## 2018-04-26 ENCOUNTER — Encounter (HOSPITAL_COMMUNITY): Payer: Self-pay | Admitting: *Deleted

## 2018-04-26 ENCOUNTER — Emergency Department (HOSPITAL_COMMUNITY): Payer: Medicare HMO

## 2018-04-26 ENCOUNTER — Emergency Department (HOSPITAL_COMMUNITY)
Admission: EM | Admit: 2018-04-26 | Discharge: 2018-04-26 | Disposition: A | Discharge status: Home / Self Care | Payer: Medicare HMO | Attending: Emergency Medicine | Admitting: Emergency Medicine

## 2018-04-26 ENCOUNTER — Other Ambulatory Visit: Payer: Self-pay

## 2018-04-26 DIAGNOSIS — L03115 Cellulitis of right lower limb: Secondary | ICD-10-CM | POA: Insufficient documentation

## 2018-04-26 DIAGNOSIS — I872 Venous insufficiency (chronic) (peripheral): Secondary | ICD-10-CM | POA: Diagnosis not present

## 2018-04-26 DIAGNOSIS — R601 Generalized edema: Secondary | ICD-10-CM | POA: Diagnosis not present

## 2018-04-26 DIAGNOSIS — Z79899 Other long term (current) drug therapy: Secondary | ICD-10-CM | POA: Diagnosis not present

## 2018-04-26 DIAGNOSIS — Z794 Long term (current) use of insulin: Secondary | ICD-10-CM | POA: Diagnosis not present

## 2018-04-26 DIAGNOSIS — E1142 Type 2 diabetes mellitus with diabetic polyneuropathy: Secondary | ICD-10-CM | POA: Diagnosis not present

## 2018-04-26 DIAGNOSIS — M79661 Pain in right lower leg: Secondary | ICD-10-CM | POA: Diagnosis present

## 2018-04-26 DIAGNOSIS — I129 Hypertensive chronic kidney disease with stage 1 through stage 4 chronic kidney disease, or unspecified chronic kidney disease: Secondary | ICD-10-CM | POA: Insufficient documentation

## 2018-04-26 DIAGNOSIS — N183 Chronic kidney disease, stage 3 (moderate): Secondary | ICD-10-CM | POA: Insufficient documentation

## 2018-04-26 DIAGNOSIS — E119 Type 2 diabetes mellitus without complications: Secondary | ICD-10-CM | POA: Diagnosis not present

## 2018-04-26 DIAGNOSIS — I878 Other specified disorders of veins: Secondary | ICD-10-CM | POA: Diagnosis not present

## 2018-04-26 DIAGNOSIS — R509 Fever, unspecified: Secondary | ICD-10-CM | POA: Diagnosis not present

## 2018-04-26 DIAGNOSIS — M7989 Other specified soft tissue disorders: Secondary | ICD-10-CM | POA: Diagnosis not present

## 2018-04-26 MED ORDER — CEPHALEXIN 500 MG PO CAPS: 500.0000 mg | ORAL_CAPSULE | Freq: Two times a day (BID) | ORAL | 0 refills | Status: AC

## 2018-04-26 MED ORDER — CEPHALEXIN 500 MG PO CAPS
500.0000 mg | ORAL_CAPSULE | Freq: Once | ORAL | Status: AC
  Administered 2018-04-26: 500 mg via ORAL
  Filled 2018-04-26: qty 1

## 2018-04-26 NOTE — Discharge Instructions (Signed)
As discussed, your evaluation today has been largely reassuring.  But, it is important that you monitor your condition carefully, and do not hesitate to return to the ED if you develop new, or concerning changes in your condition.  Otherwise, please follow-up with your physician and our vascular surgery colleagues for appropriate ongoing care.

## 2018-04-26 NOTE — ED Triage Notes (Signed)
Pt sent to er for further evaluation of swelling, warmth and redness to right lower leg from Dr Josephine Cables office,

## 2018-04-26 NOTE — ED Provider Notes (Signed)
[facility] EMERGENCY DEPARTMENT Provider Note   CSN: [id number] Arrival date & time: 04/26/18  1528     History   Chief Complaint Chief Complaint  Patient presents with  . Leg Pain    HPI [name] is a 83 y.o. female.  Patient presenting from PCPs office for work-up of right lower extremity pain and swelling.  PCP was concerned for cellulitis.  Patient's right lower leg has had increased pain and swelling over the past few days.  This is superimposed on chronic bilateral lower extremity venous stasis disease.  Patient also endorses subjective fevers.  Patient has chronic shortness of breath and fatigue but nothing new in the past few days or worsening the past few days.  Patient has no history of clots or family history of blood clots.     Past Medical History:  Diagnosis Date  . Arthritis   . CVA (cerebral infarction)   . Diabetes mellitus without complication ([place])   . GERD (gastroesophageal reflux disease)   . Hypertension   . Stroke [facility])     Patient Active Problem List   Diagnosis Date Noted  . Acute diastolic CHF (congestive heart failure) ([place]) 07/08/2017  . Acute respiratory failure with hypoxia ([place]) 07/08/2017  . Uncontrolled type 2 diabetes mellitus with diabetic nephropathy, with long-term current use of insulin ([place]) 07/08/2017  . CKD (chronic kidney disease), stage III ([place]) 07/08/2017  . Essential hypertension 07/08/2017  . Hypertension 06/23/2017  . Prolapsed internal hemorrhoids, grade 3 06/25/2016  . Abdominal pain 05/08/2015  . Dysphagia, pharyngoesophageal phase   . Other hemorrhoids   . Diverticulosis of colon without hemorrhage   . GERD (gastroesophageal reflux disease) 02/28/2014  . Constipation 02/28/2014  . Esophageal dysphagia 02/28/2014  . Rectal bleeding 02/28/2014  . OSTEOARTHRITIS, KNEE, RIGHT, SEVERE 12/13/2008  . DEGENERATIVE DISC DISEASE, LUMBOSACRAL SPINE W/RADICULOPATHY 12/13/2008  . BACK PAIN 12/13/2008  . DIABETES  04/13/2007  . FRACTURE, FEMUR, INTERTROCHANTERIC REGION 04/13/2007    Past Surgical History:  Procedure Laterality Date  . BACK SURGERY    . CATARACT EXTRACTION W/PHACO Left 02/28/2013   Procedure: CATARACT EXTRACTION PHACO AND INTRAOCULAR LENS PLACEMENT (IOL) CDE=15.60;  Surgeon: Elta Guadeloupe T. Gershon Crane, MD;  Location: [facility] ORS;  Service: Ophthalmology;  Laterality: Left;  . CHOLECYSTECTOMY    . COLONOSCOPY  12/12/2003   RMR: Normal rectum.  Long capacious tortuous colon, colonic mucosa appeared normal  . COLONOSCOPY N/A 03/21/2014   Dr. [name]: internal and external hemorrhoids, torturous colon, colonic diverticulosis   . ESOPHAGOGASTRODUODENOSCOPY  12/28/2001   QIW:LNLGX sliding hiatal hernia/. Three small bulbar ulcers, two with stigmata of bleeding and these were coagulated using heater probe unit   . ESOPHAGOGASTRODUODENOSCOPY N/A 03/21/2014   Dr. [name]: cervical esophageal web s/p dilation, negative H.pylori   . EYE SURGERY    . HIP FRACTURE SURGERY  2008  . JOINT REPLACEMENT     Rt hip, ????? sounds like just for fracture  . MALONEY DILATION N/A 03/21/2014   Procedure: [name] DILATION;  Surgeon: [name], MD;  Location: [facility];  Service: Endoscopy;  Laterality: N/A;  . SAVORY DILATION N/A 03/21/2014   Procedure: SAVORY DILATION;  Surgeon: [name], MD;  Location: [facility];  Service: Endoscopy;  Laterality: N/A;     OB History   None      Home Medications    Prior to Admission medications   Medication Sig Start Date End Date Taking? Authorizing Provider  fluticasone (FLONASE) 50 MCG/ACT  nasal spray Place 1 spray into both nostrils daily. 07/15/17  Yes Mikhail, Velta Addison, DO  furosemide (LASIX) 40 MG tablet Take 1 tablet (40 mg total) by mouth 2 (two) times daily. 07/14/17  Yes Mikhail, Maryann, DO  gabapentin (NEURONTIN) 300 MG capsule Take 300 mg by mouth 2 (two) times daily.  02/22/14  Yes [provider]  hydrALAZINE (APRESOLINE) 25 MG tablet Take 1  tablet (25 mg total) by mouth 2 (two) times daily. Patient taking differently: Take 50 mg by mouth 2 (two) times daily.  07/14/17  Yes Mikhail, Velta Addison, DO  hydrocortisone (ANUSOL-HC) 2.5 % rectal cream Place 1 application rectally 2 (two) times daily. Patient taking differently: Place 1 application rectally as needed.  06/25/16  Yes Annitta Needs, NP  insulin aspart (NOVOLOG) 100 UNIT/ML injection Inject 30 Units into the skin 3 (three) times daily before meals.    Yes [provider]  insulin detemir (LEVEMIR) 100 UNIT/ML injection Inject 80 Units into the skin at bedtime.    Yes [provider]  pantoprazole (PROTONIX) 40 MG tablet TAKE 1 TABLET BY MOUTH ONCE DAILY. Patient taking differently: Take 40 mg by mouth daily.  07/06/16  Yes Annitta Needs, NP  potassium chloride (K-DUR,KLOR-CON) 10 MEQ tablet Take 1 tablet (10 mEq total) by mouth daily. 10/19/17  Yes Herminio Commons, MD  simvastatin (ZOCOR) 20 MG tablet Take 20 mg by mouth daily.   Yes [provider]  cephALEXin (KEFLEX) 500 MG capsule Take 1 capsule (500 mg total) by mouth 2 (two) times daily for 5 days. 04/26/18 05/01/18  Carmin Muskrat, MD    Family History Family History  Problem Relation Age of Onset  . Crohn's disease Daughter   . Hypertension Mother   . Hypertension Sister   . Diabetes Brother   . Diabetes Sister   . Diabetes Sister   . Heart attack Sister   . Colon cancer Neg Hx     Social History Social History   Tobacco Use  . Smoking status: Never Smoker  . Smokeless tobacco: Never Used  Substance Use Topics  . Alcohol use: No  . Drug use: No     Allergies   Patient has no known allergies.   Review of Systems Review of Systems  Constitutional: Positive for fever.  Respiratory: Negative for shortness of breath.   Cardiovascular: Negative for chest pain.  Musculoskeletal: Positive for joint swelling.  All other systems reviewed and are negative.    Physical  Exam Updated Vital Signs BP (!) 161/66   Pulse 80   Temp 98.2 F (36.8 C) (Oral)   Resp 18   Ht 5\' 6"  (1.676 m)   Wt 108 kg   SpO2 96%   BMI 38.41 kg/m   Physical Exam  Constitutional: She appears well-developed and well-nourished.  HENT:  Head: Normocephalic and atraumatic.  Eyes: No scleral icterus.  Neck: No JVD present.  Cardiovascular: Normal rate and regular rhythm.  Pulmonary/Chest: Effort normal and breath sounds normal. No respiratory distress.  Abdominal: Soft. Bowel sounds are normal. She exhibits no distension.  Musculoskeletal:  Bilateral lower extremity edema 3+ pitting.  Dorsalis pedis obtained by Doppler.  Neurological: She is alert. She is not disoriented.  Skin:  Bilateral lower extremity venous stasis with weeping bulla.  Right worse than left.  Both extremities warm to touch     ED Treatments / Results  Labs (all labs ordered are listed, but only abnormal results are displayed) Labs Reviewed -  No data to display  EKG None  Radiology US Venous Img Lower Right (dvt Study)  Result Date: 04/26/2018 CLINICAL DATA:  Right lower extremity swelling EXAM: RIGHT LOWER EXTREMITY VENOUS DOPPLER ULTRASOUND TECHNIQUE: Gray-scale sonography with graded compression, as well as color Doppler and duplex ultrasound were performed to evaluate the lower extremity deep venous systems from the level of the common femoral vein and including the common femoral, femoral, profunda femoral, popliteal and calf veins including the posterior tibial, peroneal and gastrocnemius veins when visible. The superficial great saphenous vein was also interrogated. Spectral Doppler was utilized to evaluate flow at rest and with distal augmentation maneuvers in the common femoral, femoral and popliteal veins. COMPARISON:  None. FINDINGS: Contralateral Common Femoral Vein: Respiratory phasicity is normal and symmetric with the symptomatic side. No evidence of thrombus. Normal compressibility.  Common Femoral Vein: No evidence of thrombus. Normal compressibility, respiratory phasicity and response to augmentation. Saphenofemoral Junction: No evidence of thrombus. Normal compressibility and flow on color Doppler imaging. Profunda Femoral Vein: No evidence of thrombus. Normal compressibility and flow on color Doppler imaging. Femoral Vein: No evidence of thrombus. Normal compressibility, respiratory phasicity and response to augmentation. Popliteal Vein: No evidence of thrombus. Normal compressibility, respiratory phasicity and response to augmentation. Calf Veins: No evidence of thrombus. Normal compressibility and flow on color Doppler imaging. Superficial Great Saphenous Vein: No evidence of thrombus. Normal compressibility. Venous Reflux:  None. Other Findings:  Subcutaneous edema is noted in the calf. IMPRESSION: No evidence of deep venous thrombosis. Electronically Signed   By: Inez Catalina M.D.   On: 04/26/2018 18:37    Procedures Procedures (including critical care time)  Medications Ordered in ED Medications  cephALEXin (KEFLEX) capsule 500 mg (500 mg Oral Given 04/26/18 1853)     Initial Impression / Assessment and Plan / ED Course  I have reviewed the triage vital signs and the nursing notes.  Pertinent labs & imaging results that were available during my care of the patient were reviewed by me and considered in my medical decision making (see chart for details).     Patient Sent from PCPs office with concern of right lower extremity cellulitis.  She has presenting with right lower leg pain and subjective fevers superimposed on bilateral lower extremity venous stasis.  Both extremities are warm to touch.  Right is worse than left.  Difficult to differentiate if patient has cellulitis or if this is just venous stasis disease.  Also cannot rule out DVT.  Do not suspect PE as patient is non-tachycardic and has no dyspnea. -Right lower extremity Doppler, if negative will treat with  outpatient course of antibiotics  Interval update Right lower extremity doppler negative. Discharge home with abx for presumed cellulitis. Follow up with PCP to reassess  Final Clinical Impressions(s) / ED Diagnoses   Final diagnoses:  Cellulitis of right lower extremity  Venous stasis    ED Discharge Orders         Ordered    cephALEXin (KEFLEX) 500 MG capsule  2 times daily     04/26/18 1844           Bonnita Hollow, MD 04/26/18 2111    Carmin Muskrat, MD 04/26/18 2122

## 2018-05-02 ENCOUNTER — Encounter: Payer: Self-pay | Admitting: Cardiovascular Disease

## 2018-05-02 ENCOUNTER — Ambulatory Visit (INDEPENDENT_AMBULATORY_CARE_PROVIDER_SITE_OTHER): Payer: Medicare HMO | Admitting: Cardiovascular Disease

## 2018-05-02 VITALS — BP 152/84 | HR 89 | Ht 65.0 in | Wt 234.0 lb

## 2018-05-02 DIAGNOSIS — I447 Left bundle-branch block, unspecified: Secondary | ICD-10-CM

## 2018-05-02 DIAGNOSIS — Z9114 Patient's other noncompliance with medication regimen: Secondary | ICD-10-CM | POA: Diagnosis not present

## 2018-05-02 DIAGNOSIS — I10 Essential (primary) hypertension: Secondary | ICD-10-CM | POA: Diagnosis not present

## 2018-05-02 DIAGNOSIS — N183 Chronic kidney disease, stage 3 unspecified: Secondary | ICD-10-CM

## 2018-05-02 DIAGNOSIS — Z91148 Patient's other noncompliance with medication regimen for other reason: Secondary | ICD-10-CM

## 2018-05-02 DIAGNOSIS — I5033 Acute on chronic diastolic (congestive) heart failure: Secondary | ICD-10-CM

## 2018-05-02 NOTE — Progress Notes (Signed)
SUBJECTIVE: The patient presents for routine follow-up.  She was evaluated for venous stasis with superficial cellulitis in the ED on 04/26/2018.  She was started on antibiotics.  There is no evidence of DVT by lower extremity Dopplers.  She has chronic diastolic heart failure.  Weight is down 2 pounds since her last visit with me in August 2019.  She is here with her daughter, Louretta Shorten, who works at [employer].  She sets out her mother's medications.  We again had a long discussion together.  The patient continues to refuse to take Lasix 40 mg twice daily but does take it once daily.  She does have exertional dyspnea.  She denies chest pain.    Review of Systems: As per "subjective", otherwise negative.  No Known Allergies  Current Outpatient Medications  Medication Sig Dispense Refill  . fluticasone (FLONASE) 50 MCG/ACT nasal spray Place 1 spray into both nostrils daily. 16 g 0  . furosemide (LASIX) 40 MG tablet Take 1 tablet (40 mg total) by mouth 2 (two) times daily. 60 tablet 0  . gabapentin (NEURONTIN) 300 MG capsule Take 300 mg by mouth 2 (two) times daily.     . hydrALAZINE (APRESOLINE) 25 MG tablet Take 1 tablet (25 mg total) by mouth 2 (two) times daily. (Patient taking differently: Take 50 mg by mouth 2 (two) times daily. ) 60 tablet 0  . hydrocortisone (ANUSOL-HC) 2.5 % rectal cream Place 1 application rectally 2 (two) times daily. (Patient taking differently: Place 1 application rectally as needed. ) 30 g 1  . insulin aspart (NOVOLOG) 100 UNIT/ML injection Inject 30 Units into the skin 3 (three) times daily before meals.     . insulin detemir (LEVEMIR) 100 UNIT/ML injection Inject 80 Units into the skin at bedtime.     . pantoprazole (PROTONIX) 40 MG tablet TAKE 1 TABLET BY MOUTH ONCE DAILY. (Patient taking differently: Take 40 mg by mouth daily. ) 90 tablet 3  . potassium chloride (K-DUR,KLOR-CON) 10 MEQ tablet Take 1 tablet (10 mEq total) by mouth daily. 90 tablet 3  .  simvastatin (ZOCOR) 20 MG tablet Take 20 mg by mouth daily.     No current facility-administered medications for this visit.     Past Medical History:  Diagnosis Date  . Arthritis   . CVA (cerebral infarction)   . Diabetes mellitus without complication ([place])   . GERD (gastroesophageal reflux disease)   . Hypertension   . Stroke [facility])     Past Surgical History:  Procedure Laterality Date  . BACK SURGERY    . CATARACT EXTRACTION W/PHACO Left 02/28/2013   Procedure: CATARACT EXTRACTION PHACO AND INTRAOCULAR LENS PLACEMENT (IOL) CDE=15.60;  Surgeon: Elta Guadeloupe T. Gershon Crane, MD;  Location: [facility];  Service: Ophthalmology;  Laterality: Left;  . CHOLECYSTECTOMY    . COLONOSCOPY  12/12/2003   RMR: Normal rectum.  Long capacious tortuous colon, colonic mucosa appeared normal  . COLONOSCOPY N/A 03/21/2014   Dr. [name]: internal and external hemorrhoids, torturous colon, colonic diverticulosis   . ESOPHAGOGASTRODUODENOSCOPY  12/28/2001   UUV:[id number] sliding hiatal hernia/. Three small bulbar ulcers, two with stigmata of bleeding and these were coagulated using heater probe unit   . ESOPHAGOGASTRODUODENOSCOPY N/A 03/21/2014   Dr. [name]: cervical esophageal web s/p dilation, negative H.pylori   . EYE SURGERY    . HIP FRACTURE SURGERY  2008  . JOINT REPLACEMENT     Rt hip, ????? sounds like just for fracture  . MALONEY DILATION N/A  03/21/2014   Procedure: [name] DILATION;  Surgeon: [name], MD;  Location: [facility];  Service: Endoscopy;  Laterality: N/A;  . SAVORY DILATION N/A 03/21/2014   Procedure: SAVORY DILATION;  Surgeon: [name], MD;  Location: [facility];  Service: Endoscopy;  Laterality: N/A;    Social History   Socioeconomic History  . Marital status: Widowed    Spouse name: Not on file  . Number of children: 8  . Years of education: Not on file  . Highest education level: Not on file  Occupational History  . Not on file  Social Needs  . Financial resource strain:  Not on file  . Food insecurity:    Worry: Not on file    Inability: Not on file  . Transportation needs:    Medical: Not on file    Non-medical: Not on file  Tobacco Use  . Smoking status: Never Smoker  . Smokeless tobacco: Never Used  Substance and Sexual Activity  . Alcohol use: No  . Drug use: No  . Sexual activity: Not on file  Lifestyle  . Physical activity:    Days per week: Not on file    Minutes per session: Not on file  . Stress: Not on file  Relationships  . Social connections:    Talks on phone: Not on file    Gets together: Not on file    Attends religious service: Not on file    Active member of club or organization: Not on file    Attends meetings of clubs or organizations: Not on file    Relationship status: Not on file  . Intimate partner violence:    Fear of current or ex partner: Not on file    Emotionally abused: Not on file    Physically abused: Not on file    Forced sexual activity: Not on file  Other Topics Concern  . Not on file  Social History Narrative  . Not on file     Vitals:   05/02/18 1607  BP: (!) 152/84  Pulse: 89  SpO2: 95%  Weight: 234 lb (106.1 kg)  Height: 5\' 5"  (1.651 m)    Wt Readings from Last 3 Encounters:  05/02/18 234 lb (106.1 kg)  04/26/18 238 lb (108 kg)  01/05/18 236 lb (107 kg)     PHYSICAL EXAM General: NAD HEENT: Normal. Neck: No JVD, no thyromegaly. Lungs: Clear to auscultation bilaterally with normal respiratory effort. CV: Regular rate and rhythm, normal S1/S2, no S3/S4, no murmur. 1+ pitting bilateral lower extremity edema.    Abdomen: Soft, nontender, no distention.  Neurologic: Alert and oriented.  Psych: Normal affect. Skin: Normal. Musculoskeletal: No gross deformities.    ECG: Reviewed above under Subjective   Labs: Lab Results  Component Value Date/Time   K 4.2 07/14/2017 05:55 AM   BUN 42 (H) 07/14/2017 05:55 AM   CREATININE 1.60 (H) 07/14/2017 05:55 AM   ALT 14 07/08/2017 03:31  PM   HGB 11.3 (L) 07/14/2017 05:55 AM     Lipids: No results found for: LDLCALC, LDLDIRECT, CHOL, TRIG, HDL     ASSESSMENT AND PLAN: 1.   Acute on chronic diastolic heart failure: Wt down 2 lbs since 12/2017 at last office visit.    She is currently taking Lasix 40 mg once daily in spite of being recommended to take twice daily.  I spoke with the patient and her daughter about trying to take it for at least 3 days  and see if there is symptomatic improvement as this may encourage her to take it twice daily.  I also recommended she try taking it twice daily on alternate days.  She is on supplemental potassium as well.  2. Hypertension: Blood pressure remains elevated. She is currently on 50 mg twice daily.  This will need additional monitoring if the patient is compliant with twice daily dosing of Lasix.  3. Left bundle branch block: Chronic and also seen in December 2016.  4. Chronic kidney disease stage III: Followed by PCP.  Most recent labs in EMR are dated 07/14/2017 with BUN 42 and creatinine 1.6.  I will try to obtain a copy of most recent labs from PCP.     Disposition: Follow up 6 months   Kate Sable, M.D., F.A.C.C.

## 2018-05-02 NOTE — Patient Instructions (Signed)
Medication Instructions:  Your physician recommends that you continue on your current medications as directed. Please refer to the Current Medication list given to you today.  If you need a refill on your cardiac medications before your next appointment, please call your pharmacy.   Lab work: None If you have labs (blood work) drawn today and your tests are completely normal, you will receive your results only by: . MyChart Message (if you have MyChart) OR . A paper copy in the mail If you have any lab test that is abnormal or we need to change your treatment, we will call you to review the results.  Testing/Procedures: None  Follow-Up: At [facility], you and your health needs are our priority.  As part of our continuing mission to provide you with exceptional heart care, we have created designated Provider Care Teams.  These Care Teams include your primary Cardiologist (physician) and Advanced Practice Providers (APPs -  Physician Assistants and Nurse Practitioners) who all work together to provide you with the care you need, when you need it. You will need a follow up appointment in 6 months.  Please call our office 2 months in advance to schedule this appointment.  You may see Suresh Koneswaran, MD or one of the following Advanced Practice Providers on your designated Care Team:   Brittany Strader, PA-C ([place] Office) . Michele Lenze, PA-C ([place] Office)  Any Other Special Instructions Will Be Listed Below (If Applicable). None   

## 2018-05-06 ENCOUNTER — Other Ambulatory Visit: Payer: Self-pay

## 2018-05-06 ENCOUNTER — Encounter (HOSPITAL_COMMUNITY): Payer: Self-pay | Admitting: Emergency Medicine

## 2018-05-06 ENCOUNTER — Emergency Department (HOSPITAL_COMMUNITY): Payer: Medicare HMO

## 2018-05-06 ENCOUNTER — Emergency Department (HOSPITAL_COMMUNITY)
Admission: EM | Admit: 2018-05-06 | Discharge: 2018-05-06 | Disposition: A | Discharge status: Home / Self Care | Payer: Medicare HMO | Attending: Emergency Medicine | Admitting: Emergency Medicine

## 2018-05-06 DIAGNOSIS — R0602 Shortness of breath: Secondary | ICD-10-CM | POA: Diagnosis not present

## 2018-05-06 DIAGNOSIS — E119 Type 2 diabetes mellitus without complications: Secondary | ICD-10-CM | POA: Diagnosis not present

## 2018-05-06 DIAGNOSIS — N183 Chronic kidney disease, stage 3 (moderate): Secondary | ICD-10-CM | POA: Insufficient documentation

## 2018-05-06 DIAGNOSIS — Z79899 Other long term (current) drug therapy: Secondary | ICD-10-CM | POA: Insufficient documentation

## 2018-05-06 DIAGNOSIS — R1084 Generalized abdominal pain: Secondary | ICD-10-CM | POA: Diagnosis present

## 2018-05-06 DIAGNOSIS — N3000 Acute cystitis without hematuria: Secondary | ICD-10-CM | POA: Diagnosis not present

## 2018-05-06 DIAGNOSIS — R079 Chest pain, unspecified: Secondary | ICD-10-CM | POA: Insufficient documentation

## 2018-05-06 DIAGNOSIS — M25519 Pain in unspecified shoulder: Secondary | ICD-10-CM | POA: Diagnosis not present

## 2018-05-06 DIAGNOSIS — Z794 Long term (current) use of insulin: Secondary | ICD-10-CM | POA: Diagnosis not present

## 2018-05-06 DIAGNOSIS — R52 Pain, unspecified: Secondary | ICD-10-CM | POA: Diagnosis not present

## 2018-05-06 DIAGNOSIS — N200 Calculus of kidney: Secondary | ICD-10-CM | POA: Diagnosis not present

## 2018-05-06 DIAGNOSIS — R1012 Left upper quadrant pain: Secondary | ICD-10-CM | POA: Diagnosis not present

## 2018-05-06 DIAGNOSIS — R05 Cough: Secondary | ICD-10-CM | POA: Diagnosis not present

## 2018-05-06 DIAGNOSIS — I129 Hypertensive chronic kidney disease with stage 1 through stage 4 chronic kidney disease, or unspecified chronic kidney disease: Secondary | ICD-10-CM | POA: Diagnosis not present

## 2018-05-06 DIAGNOSIS — I10 Essential (primary) hypertension: Secondary | ICD-10-CM | POA: Diagnosis not present

## 2018-05-06 DIAGNOSIS — E1165 Type 2 diabetes mellitus with hyperglycemia: Secondary | ICD-10-CM | POA: Diagnosis not present

## 2018-05-06 LAB — COMPREHENSIVE METABOLIC PANEL
ALT: 21 U/L (ref 0–44)
AST: 20 U/L (ref 15–41)
Albumin: 3.6 g/dL (ref 3.5–5.0)
Alkaline Phosphatase: 58 U/L (ref 38–126)
Anion gap: 7 (ref 5–15)
BILIRUBIN TOTAL: 0.3 mg/dL (ref 0.3–1.2)
BUN: 26 mg/dL — ABNORMAL HIGH (ref 8–23)
CHLORIDE: 106 mmol/L (ref 98–111)
CO2: 26 mmol/L (ref 22–32)
CREATININE: 1.42 mg/dL — AB (ref 0.44–1.00)
Calcium: 9 mg/dL (ref 8.9–10.3)
GFR, EST AFRICAN AMERICAN: 40 mL/min — AB (ref >60)
GFR, EST NON AFRICAN AMERICAN: 35 mL/min — AB (ref >60)
Glucose, Bld: 191 mg/dL — ABNORMAL HIGH (ref 70–99)
Potassium: 3.8 mmol/L (ref 3.5–5.1)
Sodium: 139 mmol/L (ref 135–145)
TOTAL PROTEIN: 8.1 g/dL (ref 6.5–8.1)

## 2018-05-06 LAB — URINALYSIS, ROUTINE W REFLEX MICROSCOPIC
Bilirubin Urine: NEGATIVE
GLUCOSE, UA: 50 mg/dL — AB
Ketones, ur: NEGATIVE mg/dL
Nitrite: NEGATIVE
Protein, ur: 30 mg/dL — AB
Specific Gravity, Urine: 1.011 (ref 1.005–1.030)
pH: 5 (ref 5.0–8.0)

## 2018-05-06 LAB — CBC WITH DIFFERENTIAL/PLATELET
Abs Immature Granulocytes: 0.03 10*3/uL (ref 0.00–0.07)
BASOS PCT: 1 %
Basophils Absolute: 0.1 10*3/uL (ref 0.0–0.1)
EOS ABS: 0.2 10*3/uL (ref 0.0–0.5)
Eosinophils Relative: 2 %
HCT: 40.8 % (ref 36.0–46.0)
Hemoglobin: 12.5 g/dL (ref 12.0–15.0)
Immature Granulocytes: 0 %
Lymphocytes Relative: 24 %
Lymphs Abs: 2.4 10*3/uL (ref 0.7–4.0)
MCH: 27.7 pg (ref 26.0–34.0)
MCHC: 30.6 g/dL (ref 30.0–36.0)
MCV: 90.3 fL (ref 80.0–100.0)
MONO ABS: 0.8 10*3/uL (ref 0.1–1.0)
Monocytes Relative: 8 %
NEUTROS ABS: 6.4 10*3/uL (ref 1.7–7.7)
Neutrophils Relative %: 65 %
Platelets: 248 10*3/uL (ref 150–400)
RBC: 4.52 MIL/uL (ref 3.87–5.11)
RDW: 15.5 % (ref 11.5–15.5)
WBC: 9.8 10*3/uL (ref 4.0–10.5)
nRBC: 0 % (ref 0.0–0.2)

## 2018-05-06 LAB — LIPASE, BLOOD: LIPASE: 40 U/L (ref 11–51)

## 2018-05-06 LAB — TROPONIN I: Troponin I: <0.03 ng/mL (ref <0.03)

## 2018-05-06 MED ORDER — SODIUM CHLORIDE 0.9 % IV SOLN
INTRAVENOUS | Status: DC
  Administered 2018-05-06: 19:00:00 via INTRAVENOUS

## 2018-05-06 MED ORDER — SODIUM CHLORIDE 0.9 % IV BOLUS
1000.0000 mL | Freq: Once | INTRAVENOUS | Status: AC
  Administered 2018-05-06: 1000 mL via INTRAVENOUS

## 2018-05-06 MED ORDER — IOPAMIDOL (ISOVUE-300) INJECTION 61%
75.0000 mL | Freq: Once | INTRAVENOUS | Status: AC | PRN
  Administered 2018-05-06: 75 mL via INTRAVENOUS

## 2018-05-06 MED ORDER — ONDANSETRON HCL 4 MG/2ML IJ SOLN
4.0000 mg | Freq: Once | INTRAMUSCULAR | Status: AC
  Administered 2018-05-06: 4 mg via INTRAVENOUS
  Filled 2018-05-06: qty 2

## 2018-05-06 MED ORDER — SODIUM CHLORIDE 0.9 % IV SOLN
1.0000 g | Freq: Once | INTRAVENOUS | Status: AC
  Administered 2018-05-06: 1 g via INTRAVENOUS
  Filled 2018-05-06: qty 10

## 2018-05-06 MED ORDER — MORPHINE SULFATE (PF) 4 MG/ML IV SOLN
4.0000 mg | Freq: Once | INTRAVENOUS | Status: AC
  Administered 2018-05-06: 4 mg via INTRAVENOUS
  Filled 2018-05-06: qty 1

## 2018-05-06 MED ORDER — SULFAMETHOXAZOLE-TRIMETHOPRIM 800-160 MG PO TABS: 1.0000 | ORAL_TABLET | Freq: Two times a day (BID) | ORAL | 0 refills | Status: AC

## 2018-05-06 NOTE — ED Triage Notes (Signed)
Pain 10/10 to left shoulder since 3 am, pain to left back and radiates down back and around to abdomen. Denies chest pain or any other discomfort. No n/v/d Sats 98% on RA.

## 2018-05-06 NOTE — ED Notes (Signed)
Pt urinated in the bed before we could get a urine sample. Pt was informed that we need a urine sample.

## 2018-05-06 NOTE — Discharge Instructions (Addendum)
Take the antibiotics as prescribed, follow up with your primary care doctor

## 2018-05-06 NOTE — ED Provider Notes (Signed)
[facility] Provider Note   CSN: [id number] Arrival date & time: 05/06/18  1632     History   Chief Complaint Chief Complaint  Patient presents with  . Shoulder Pain    left    HPI [name] is a 83 y.o. female.  HPI Patient presents to the emergency room for evaluation of left sided flank pain.  Patient states her symptoms started yesterday.  Kept her up most of the night and persisted today.  The pains on the left lower chest upper abdominal area.  It radiates towards the front.  Patient denies any trouble with nausea vomiting or diarrhea.  She is not having any dysuria.  She denies any chest pain.  Denies any shortness of breath.  Patient denies any history of PE or MI.  She does have a history of CHF.  She has noticed chronic lymphedema but not significantly changed. Past Medical History:  Diagnosis Date  . Arthritis   . CVA (cerebral infarction)   . Diabetes mellitus without complication ([place])   . GERD (gastroesophageal reflux disease)   . Hypertension   . Stroke [facility])     Patient Active Problem List   Diagnosis Date Noted  . Acute diastolic CHF (congestive heart failure) ([place]) 07/08/2017  . Acute respiratory failure with hypoxia ([place]) 07/08/2017  . Uncontrolled type 2 diabetes mellitus with diabetic nephropathy, with long-term current use of insulin ([place]) 07/08/2017  . CKD (chronic kidney disease), stage III ([place]) 07/08/2017  . Essential hypertension 07/08/2017  . Hypertension 06/23/2017  . Prolapsed internal hemorrhoids, grade 3 06/25/2016  . Abdominal pain 05/08/2015  . Dysphagia, pharyngoesophageal phase   . Other hemorrhoids   . Diverticulosis of colon without hemorrhage   . GERD (gastroesophageal reflux disease) 02/28/2014  . Constipation 02/28/2014  . Esophageal dysphagia 02/28/2014  . Rectal bleeding 02/28/2014  . OSTEOARTHRITIS, KNEE, RIGHT, SEVERE 12/13/2008  . DEGENERATIVE DISC DISEASE, LUMBOSACRAL SPINE W/RADICULOPATHY  12/13/2008  . BACK PAIN 12/13/2008  . DIABETES 04/13/2007  . FRACTURE, FEMUR, INTERTROCHANTERIC REGION 04/13/2007    Past Surgical History:  Procedure Laterality Date  . BACK SURGERY    . CATARACT EXTRACTION W/PHACO Left 02/28/2013   Procedure: CATARACT EXTRACTION PHACO AND INTRAOCULAR LENS PLACEMENT (IOL) CDE=15.60;  Surgeon: Elta Guadeloupe T. Gershon Crane, MD;  Location: [facility];  Service: Ophthalmology;  Laterality: Left;  . CHOLECYSTECTOMY    . COLONOSCOPY  12/12/2003   RMR: Normal rectum.  Long capacious tortuous colon, colonic mucosa appeared normal  . COLONOSCOPY N/A 03/21/2014   Dr. [name]: internal and external hemorrhoids, torturous colon, colonic diverticulosis   . ESOPHAGOGASTRODUODENOSCOPY  12/28/2001   VZC:HYIFO sliding hiatal hernia/. Three small bulbar ulcers, two with stigmata of bleeding and these were coagulated using heater probe unit   . ESOPHAGOGASTRODUODENOSCOPY N/A 03/21/2014   Dr. [name]: cervical esophageal web s/p dilation, negative H.pylori   . EYE SURGERY    . HIP FRACTURE SURGERY  2008  . JOINT REPLACEMENT     Rt hip, ????? sounds like just for fracture  . MALONEY DILATION N/A 03/21/2014   Procedure: [name] DILATION;  Surgeon: [name], MD;  Location[facility];  Service: Endoscopy;  Laterality: N/A;  . SAVORY DILATION N/A 03/21/2014   Procedure: SAVORY DILATION;  Surgeon: [name], MD;  Location[facility];  Service: Endoscopy;  Laterality: N/A;     OB History   No obstetric history on file.      Home Medications    Prior  to Admission medications   Medication Sig Start Date End Date Taking? Authorizing Provider  fluticasone (FLONASE) 50 MCG/ACT nasal spray Place 1 spray into both nostrils daily. 07/15/17   Mikhail, Velta Addison, DO  furosemide (LASIX) 40 MG tablet Take 1 tablet (40 mg total) by mouth 2 (two) times daily. 07/14/17   Mikhail, Velta Addison, DO  gabapentin (NEURONTIN) 300 MG capsule Take 300 mg by mouth 2 (two) times daily.  02/22/14   [provider]  insulin aspart (NOVOLOG) 100 UNIT/ML injection Inject 30 Units into the skin 3 (three) times daily before meals.     [provider]  insulin detemir (LEVEMIR) 100 UNIT/ML injection Inject 80 Units into the skin at bedtime.     [provider]  pantoprazole (PROTONIX) 40 MG tablet TAKE 1 TABLET BY MOUTH ONCE DAILY. Patient taking differently: Take 40 mg by mouth daily.  07/06/16   Annitta Needs, NP  potassium chloride (K-DUR,KLOR-CON) 10 MEQ tablet Take 1 tablet (10 mEq total) by mouth daily. 10/19/17   Herminio Commons, MD  simvastatin (ZOCOR) 20 MG tablet Take 20 mg by mouth daily.    [provider]  sulfamethoxazole-trimethoprim (BACTRIM DS,SEPTRA DS) 800-160 MG tablet Take 1 tablet by mouth 2 (two) times daily for 7 days. 05/06/18 05/13/18  Dorie Rank, MD    Family History Family History  Problem Relation Age of Onset  . Crohn's disease Daughter   . Hypertension Mother   . Hypertension Sister   . Diabetes Brother   . Diabetes Sister   . Diabetes Sister   . Heart attack Sister   . Colon cancer Neg Hx     Social History Social History   Tobacco Use  . Smoking status: Never Smoker  . Smokeless tobacco: Never Used  Substance Use Topics  . Alcohol use: No  . Drug use: No     Allergies   Patient has no known allergies.   Review of Systems Review of Systems  All other systems reviewed and are negative.    Physical Exam Updated Vital Signs BP (!) 188/72   Pulse 76   Temp 98.7 F (37.1 C) (Oral)   Resp 19   Ht 1.651 m (5\' 5" )   Wt 106.1 kg   SpO2 96%   BMI 38.94 kg/m   Physical Exam Vitals signs and nursing note reviewed.  Constitutional:      General: She is not in acute distress.    Appearance: She is well-developed.  HENT:     Head: Normocephalic and atraumatic.     Right Ear: External ear normal.     Left Ear: External ear normal.  Eyes:     General: No scleral icterus.       Right eye: No discharge.          Left eye: No discharge.     Conjunctiva/sclera: Conjunctivae normal.  Neck:     Musculoskeletal: Neck supple.     Trachea: No tracheal deviation.  Cardiovascular:     Rate and Rhythm: Normal rate and regular rhythm.  Pulmonary:     Effort: Pulmonary effort is normal. No respiratory distress.     Breath sounds: Normal breath sounds. No stridor. No wheezing or rales.  Abdominal:     General: Bowel sounds are normal. There is no distension.     Palpations: Abdomen is soft.     Tenderness: There is abdominal tenderness in the left upper quadrant. There is no guarding or rebound.  Musculoskeletal:  General: No tenderness.  Skin:    General: Skin is warm and dry.     Findings: No rash.  Neurological:     Mental Status: She is alert.     Cranial Nerves: No cranial nerve deficit (no facial droop, extraocular movements intact, no slurred speech).     Sensory: No sensory deficit.     Motor: No abnormal muscle tone or seizure activity.     Coordination: Coordination normal.      ED Treatments / Results  Labs (all labs ordered are listed, but only abnormal results are displayed) Labs Reviewed  COMPREHENSIVE METABOLIC PANEL - Abnormal; Notable for the following components:      Result Value   Glucose, Bld 191 (*)    BUN 26 (*)    Creatinine, Ser 1.42 (*)    GFR calc non Af Amer 35 (*)    GFR calc Af Amer 40 (*)    All other components within normal limits  URINALYSIS, ROUTINE W REFLEX MICROSCOPIC - Abnormal; Notable for the following components:   APPearance HAZY (*)    Glucose, UA 50 (*)    Hgb urine dipstick SMALL (*)    Protein, ur 30 (*)    Leukocytes, UA TRACE (*)    Bacteria, UA FEW (*)    All other components within normal limits  URINE CULTURE  LIPASE, BLOOD  CBC WITH DIFFERENTIAL/PLATELET  TROPONIN I    EKG EKG Interpretation  Date/Time:  Friday May 06 2018 16:59:35 EST Ventricular Rate:  72 PR Interval:    QRS Duration: 106 QT  Interval:  431 QTC Calculation: 472 R Axis:   -43 Text Interpretation:  Sinus rhythm LVH with secondary repolarization abnormality Anterior infarct, old No significant change since last tracing Confirmed by Dorie Rank [phone]) on 05/06/2018 5:20:17 PM   Radiology Dg Chest 2 View  Result Date: 05/06/2018 CLINICAL DATA:  left shoulder since 3 am, pain to left back and radiates down back and around to abdomen. Sob with exertion. Denies cough. History of DM, HTN, CVA, GERD, Stroke. EXAM: CHEST - 2 VIEW COMPARISON:  07/08/2017 FINDINGS: Mild-to-moderate enlargement of the cardiopericardial silhouette, similar to the prior exam. Mildly apical lordotic projection. Accounting for this, the lungs appear clear. There is no current edema. Thoracic spondylosis. No pleural effusion. IMPRESSION: 1. Mild-to-moderate enlargement of the cardiopericardial silhouette, without edema. Electronically Signed   By: Van Clines M.D.   On: 05/06/2018 18:22   Ct Abdomen Pelvis W Contrast  Result Date: 05/06/2018 CLINICAL DATA:  83 y/o F; left shoulder and left back pain radiating down around the abdomen. EXAM: CT ABDOMEN AND PELVIS WITH CONTRAST TECHNIQUE: Multidetector CT imaging of the abdomen and pelvis was performed using the standard protocol following bolus administration of intravenous contrast. CONTRAST:  58mL ISOVUE-300 IOPAMIDOL (ISOVUE-300) INJECTION 61% COMPARISON:  04/20/2015 CT abdomen and pelvis. FINDINGS: Lower chest: No acute abnormality. Hepatobiliary: Punctate nonspecific calcification in right lobe of liver paddle with prior granulomatous disease. Otherwise no focal liver abnormality is seen. Status post cholecystectomy. No biliary dilatation. Pancreas: Unremarkable. No pancreatic ductal dilatation or surrounding inflammatory changes. Spleen: Normal in size without focal abnormality. Adrenals/Urinary Tract: Normal adrenal glands. Hypodensities in the kidney bilaterally measuring up to 13 mm in left  interpolar kidney compatible with cysts. Left kidney upper pole nonobstructing punctate stone. No ureter stone or hydronephrosis. Normal bladder. Stomach/Bowel: Stomach is within normal limits. Appendix appears normal. No evidence of bowel wall thickening, distention, or inflammatory changes. Vascular/Lymphatic: Aortic atherosclerosis. No  enlarged abdominal or pelvic lymph nodes. Reproductive: Uterus and bilateral adnexa are unremarkable. Other: Soft tissue stranding within the lower anterior abdominal wall, probably injection sites. Stable broad-based umbilical hernia containing fat. Musculoskeletal: Mild osteoarthrosis of the left hip joint with fibrocystic degeneration. Chronic fracture of the right proximal femur post fixation, incompletely visualized. Osteoarthrosis of sacroiliac joints with vacuum phenomenon and sclerosis of the articular surfaces. Lumbar spondylosis and mild levocurvature. IMPRESSION: 1. No acute process identified. 2. Punctate left kidney upper pole nonobstructing stone. No ureter stone or hydronephrosis identified. 3. Stable small broad-based umbilical hernia containing fat. 4. Aortic Atherosclerosis (ICD10-I70.0). Electronically Signed   By: Kristine Garbe M.D.   On: 05/06/2018 20:43    Procedures Procedures (including critical care time)  Medications Ordered in ED Medications  sodium chloride 0.9 % bolus 1,000 mL (0 mLs Intravenous Stopped 05/06/18 1909)    And  0.9 %  sodium chloride infusion ( Intravenous New Bag/Given 05/06/18 1910)  ondansetron (ZOFRAN) injection 4 mg (4 mg Intravenous Given 05/06/18 1738)  morphine 4 MG/ML injection 4 mg (4 mg Intravenous Given 05/06/18 1740)  iopamidol (ISOVUE-300) 61 % injection 75 mL (75 mLs Intravenous Contrast Given 05/06/18 2006)  cefTRIAXone (ROCEPHIN) 1 g in sodium chloride 0.9 % 100 mL IVPB (1 g Intravenous New Bag/Given 05/06/18 2046)     Initial Impression / Assessment and Plan / ED Course  I have reviewed the  triage vital signs and the nursing notes.  Pertinent labs & imaging results that were available during my care of the patient were reviewed by me and considered in my medical decision making (see chart for details).   Patient presented to the emergency room for evaluation of abdominal pain.  Patient was having significant pain in the left flank so a CT scan was done to rule out diverticulitis or other acute abdominal pathology.  CT scan was negative for acute process.  Her urinalysis does show 21 and 50 white blood cells and few bacteria.  I suspect her symptoms are related to urinary tract infection.  He is afebrile.  No significant leukocytosis.  I doubt pyelonephritis or sepsis.  Patient was given a dose of Rocephin.  I will discharge her home on a course of Bactrim because family states she recently was on Keflex.  Final Clinical Impressions(s) / ED Diagnoses   Final diagnoses:  Acute cystitis without hematuria    ED Discharge Orders         Ordered    sulfamethoxazole-trimethoprim (BACTRIM DS,SEPTRA DS) 800-160 MG tablet  2 times daily     05/06/18 2122           Dorie Rank, MD 05/06/18 2124

## 2018-05-09 LAB — URINE CULTURE

## 2018-05-10 ENCOUNTER — Telehealth: Payer: Self-pay

## 2018-05-10 NOTE — Telephone Encounter (Signed)
Post ED Visit - Positive Culture Follow-up  Culture report reviewed by antimicrobial stewardship pharmacist:  []  Elenor Quinones, Pharm.D. []  Heide Guile, Pharm.D., BCPS AQ-ID []  Parks Neptune, Pharm.D., BCPS []  Alycia Rossetti, Pharm.D., BCPS []  [place], Pharm.D., BCPS, AAHIVP []  Legrand Como, Pharm.D., BCPS, AAHIVP []  Salome Arnt, PharmD, BCPS []  Johnnette Gourd, PharmD, BCPS []  Hughes Better, PharmD, BCPS []  Leeroy Cha, PharmD [facility] Positive urine culture Treated with Bactrim DS, organism sensitive to the same and no further patient follow-up is required at this time.  Genia Del 05/10/2018, 11:41 AM

## 2018-06-06 ENCOUNTER — Encounter: Payer: Self-pay | Admitting: Internal Medicine

## 2018-07-04 DIAGNOSIS — D649 Anemia, unspecified: Secondary | ICD-10-CM | POA: Diagnosis not present

## 2018-07-04 DIAGNOSIS — N183 Chronic kidney disease, stage 3 (moderate): Secondary | ICD-10-CM | POA: Diagnosis not present

## 2018-07-04 DIAGNOSIS — I5032 Chronic diastolic (congestive) heart failure: Secondary | ICD-10-CM | POA: Diagnosis not present

## 2018-07-26 DIAGNOSIS — G8190 Hemiplegia, unspecified affecting unspecified side: Secondary | ICD-10-CM | POA: Diagnosis not present

## 2018-07-26 DIAGNOSIS — N183 Chronic kidney disease, stage 3 (moderate): Secondary | ICD-10-CM | POA: Diagnosis not present

## 2018-07-26 DIAGNOSIS — I5032 Chronic diastolic (congestive) heart failure: Secondary | ICD-10-CM | POA: Diagnosis not present

## 2018-08-08 DIAGNOSIS — E559 Vitamin D deficiency, unspecified: Secondary | ICD-10-CM | POA: Diagnosis not present

## 2018-08-08 DIAGNOSIS — I10 Essential (primary) hypertension: Secondary | ICD-10-CM | POA: Diagnosis not present

## 2018-08-08 DIAGNOSIS — Z79899 Other long term (current) drug therapy: Secondary | ICD-10-CM | POA: Diagnosis not present

## 2018-08-08 DIAGNOSIS — D509 Iron deficiency anemia, unspecified: Secondary | ICD-10-CM | POA: Diagnosis not present

## 2018-08-08 DIAGNOSIS — Z1159 Encounter for screening for other viral diseases: Secondary | ICD-10-CM | POA: Diagnosis not present

## 2018-08-08 DIAGNOSIS — D519 Vitamin B12 deficiency anemia, unspecified: Secondary | ICD-10-CM | POA: Diagnosis not present

## 2018-08-08 DIAGNOSIS — R809 Proteinuria, unspecified: Secondary | ICD-10-CM | POA: Diagnosis not present

## 2018-08-08 DIAGNOSIS — N183 Chronic kidney disease, stage 3 (moderate): Secondary | ICD-10-CM | POA: Diagnosis not present

## 2018-10-25 DIAGNOSIS — E1142 Type 2 diabetes mellitus with diabetic polyneuropathy: Secondary | ICD-10-CM | POA: Diagnosis not present

## 2018-10-25 DIAGNOSIS — E119 Type 2 diabetes mellitus without complications: Secondary | ICD-10-CM | POA: Diagnosis not present

## 2018-10-25 DIAGNOSIS — N183 Chronic kidney disease, stage 3 (moderate): Secondary | ICD-10-CM | POA: Diagnosis not present

## 2018-10-25 DIAGNOSIS — I509 Heart failure, unspecified: Secondary | ICD-10-CM | POA: Diagnosis not present

## 2018-10-25 DIAGNOSIS — I5032 Chronic diastolic (congestive) heart failure: Secondary | ICD-10-CM | POA: Diagnosis not present

## 2018-11-24 DIAGNOSIS — E1165 Type 2 diabetes mellitus with hyperglycemia: Secondary | ICD-10-CM | POA: Diagnosis not present

## 2018-11-24 DIAGNOSIS — I10 Essential (primary) hypertension: Secondary | ICD-10-CM | POA: Diagnosis not present

## 2018-12-22 ENCOUNTER — Other Ambulatory Visit: Payer: Self-pay

## 2018-12-22 ENCOUNTER — Encounter: Payer: Self-pay | Admitting: Cardiovascular Disease

## 2018-12-22 ENCOUNTER — Ambulatory Visit (INDEPENDENT_AMBULATORY_CARE_PROVIDER_SITE_OTHER): Payer: Medicare HMO | Admitting: Cardiovascular Disease

## 2018-12-22 VITALS — BP 181/87 | HR 94 | Temp 97.3°F | Ht 65.0 in | Wt 238.0 lb

## 2018-12-22 DIAGNOSIS — I10 Essential (primary) hypertension: Secondary | ICD-10-CM | POA: Diagnosis not present

## 2018-12-22 DIAGNOSIS — I447 Left bundle-branch block, unspecified: Secondary | ICD-10-CM

## 2018-12-22 DIAGNOSIS — I5032 Chronic diastolic (congestive) heart failure: Secondary | ICD-10-CM

## 2018-12-22 DIAGNOSIS — N183 Chronic kidney disease, stage 3 unspecified: Secondary | ICD-10-CM

## 2018-12-22 NOTE — Progress Notes (Signed)
SUBJECTIVE: The patient presents for follow up of chronic diastolic heart failure.   She denies chest pain, palpitations, and shortness of breath.  She did not take Lasix today since she was going to be out. Her legs are swollen. She says they go down when she takes 80 mg Lasix as prescribed by her nephrologist.  She has to buy new batteries today for her BP cuff.   Review of Systems: As per "subjective", otherwise negative.  No Known Allergies  Current Outpatient Medications  Medication Sig Dispense Refill   fluticasone (FLONASE) 50 MCG/ACT nasal spray Place 1 spray into both nostrils daily. 16 g 0   furosemide (LASIX) 40 MG tablet Take 1 tablet (40 mg total) by mouth 2 (two) times daily. 60 tablet 0   gabapentin (NEURONTIN) 300 MG capsule Take 300 mg by mouth 2 (two) times daily.      insulin aspart (NOVOLOG) 100 UNIT/ML injection Inject 30 Units into the skin 3 (three) times daily before meals.      insulin detemir (LEVEMIR) 100 UNIT/ML injection Inject 80 Units into the skin at bedtime.      pantoprazole (PROTONIX) 40 MG tablet TAKE 1 TABLET BY MOUTH ONCE DAILY. (Patient taking differently: Take 40 mg by mouth daily. ) 90 tablet 3   simvastatin (ZOCOR) 20 MG tablet Take 20 mg by mouth daily.     No current facility-administered medications for this visit.     Past Medical History:  Diagnosis Date   Arthritis    CVA (cerebral infarction)    Diabetes mellitus without complication (HCC)    GERD (gastroesophageal reflux disease)    Hypertension    Stroke [facility])     Past Surgical History:  Procedure Laterality Date   BACK SURGERY     CATARACT EXTRACTION W/PHACO Left 02/28/2013   Procedure: CATARACT EXTRACTION PHACO AND INTRAOCULAR LENS PLACEMENT (IOL) CDE=15.60;  Surgeon: Elta Guadeloupe T. Gershon Crane, MD;  Location: [facility];  Service: Ophthalmology;  Laterality: Left;   CHOLECYSTECTOMY     COLONOSCOPY  12/12/2003   RMR: Normal rectum.  Long capacious tortuous  colon, colonic mucosa appeared normal   COLONOSCOPY N/A 03/21/2014   Dr. [name]: internal and external hemorrhoids, torturous colon, colonic diverticulosis    ESOPHAGOGASTRODUODENOSCOPY  12/28/2001   [id number] sliding hiatal hernia/. Three small bulbar ulcers, two with stigmata of bleeding and these were coagulated using heater probe unit    ESOPHAGOGASTRODUODENOSCOPY N/A 03/21/2014   Dr. [name]: cervical esophageal web s/p dilation, negative H.pylori    EYE SURGERY     HIP FRACTURE SURGERY  2008   JOINT REPLACEMENT     Rt hip, ????? sounds like just for fracture   MALONEY DILATION N/A 03/21/2014   Procedure: [name] DILATION;  Surgeon: [name], MD;  Location: [facility] SU[facility];  Service: Endoscopy;  Laterality: N/A;   SAVORY DILATION N/A 03/21/2014   Procedure: SAVORY DILATION;  Surgeon: [name], MD;  Location: [facility] END[facility];  Service: Endoscopy;  Laterality: N/A;    Social History   Socioeconomic History   Marital status: Widowed    Spouse name: Not on file   Number of children: 8   Years of education: Not on file   Highest education level: Not on file  Occupational History   Not on file  Social Needs   Financial resource strain: Not on file   Food insecurity    Worry: Not on file    Inability: Not on file  Transportation needs    Medical: Not on file    Non-medical: Not on file  Tobacco Use   Smoking status: Never Smoker   Smokeless tobacco: Never Used  Substance and Sexual Activity   Alcohol use: No   Drug use: No   Sexual activity: Not on file  Lifestyle   Physical activity    Days per week: Not on file    Minutes per session: Not on file   Stress: Not on file  Relationships   Social connections    Talks on phone: Not on file    Gets together: Not on file    Attends religious service: Not on file    Active member of club or organization: Not on file    Attends meetings of clubs or organizations: Not on file    Relationship  status: Not on file   Intimate partner violence    Fear of current or ex partner: Not on file    Emotionally abused: Not on file    Physically abused: Not on file    Forced sexual activity: Not on file  Other Topics Concern   Not on file  Social History Narrative   Not on file     Vitals:   12/22/18 1338  BP: (!) 181/87  Pulse: 94  Temp: (!) 97.3 F (36.3 C)  TempSrc: Temporal  SpO2: 97%  Height: 5\' 5"  (1.651 m)    Wt Readings from Last 3 Encounters:  05/06/18 234 lb (106.1 kg)  05/02/18 234 lb (106.1 kg)  04/26/18 238 lb (108 kg)     PHYSICAL EXAM General: NAD HEENT: Normal. Neck: No JVD, no thyromegaly. Lungs: Clear to auscultation bilaterally with normal respiratory effort. CV: Regular rate and rhythm, normal S1/S2, no S3/S4, no murmur. Tense b/l LE edema.  Abdomen: Soft, nontender, no distention.  Neurologic: Alert and oriented.  Psych: Normal affect. Skin: Normal. Musculoskeletal: No gross deformities.    ECG: Reviewed above under Subjective   Labs: Lab Results  Component Value Date/Time   K 3.8 05/06/2018 05:33 PM   BUN 26 (H) 05/06/2018 05:33 PM   CREATININE 1.42 (H) 05/06/2018 05:33 PM   ALT 21 05/06/2018 05:33 PM   HGB 12.5 05/06/2018 05:33 PM     Lipids: No results found for: LDLCALC, LDLDIRECT, CHOL, TRIG, HDL     ASSESSMENT AND PLAN:  1. Chronic diastolic heart failure: Takes Lasix 80 mg daily with supplemental KCl. Did not take today. Legs are swollen. I will aim to control BP. Diuretics dosed by nephrologist.  2. Hypertension: Blood pressureremains elevated. I have asked her to check BP 3 times/week for one month to see if meds need to be adjusted.  3. Left bundle branch block: Chronic.  4. Chronic kidney disease stage III: Followed by nephrology.  Most recent labs in EMR are dated 05/06/2018 with BUN 26 and creatinine 1.42.      Disposition: Follow up 6 months   Kate Sable, M.D., F.A.C.C.

## 2018-12-22 NOTE — Patient Instructions (Signed)
Medication Instructions: Your physician recommends that you continue on your current medications as directed. Please refer to the Current Medication list given to you today.   Labwork: None today  Procedures/Testing: None today  Follow-Up: 6 months with dr.Koneswaran  Any Additional Special Instructions Will Be Listed Below (If Applicable).   Take your blood pressure every Monday-Wednesday- and Friday for 1 month.Call us with results  If you need a refill on your cardiac medications before your next appointment, please call your pharmacy.     Thank you for choosing [place] !

## 2018-12-25 DIAGNOSIS — E1165 Type 2 diabetes mellitus with hyperglycemia: Secondary | ICD-10-CM | POA: Diagnosis not present

## 2018-12-25 DIAGNOSIS — I5032 Chronic diastolic (congestive) heart failure: Secondary | ICD-10-CM | POA: Diagnosis not present

## 2019-01-03 ENCOUNTER — Telehealth: Payer: Self-pay | Admitting: Cardiovascular Disease

## 2019-01-03 NOTE — Telephone Encounter (Signed)
Pt is supposed to check her BP for one month, states her BP machine is not working.

## 2019-01-03 NOTE — Telephone Encounter (Signed)
Returned pt call. She stated that she needs to check her bp, but her cuff that she has had for many years will no longer work. It will be several months before she will be able to obtain a new one. I informed her of a program that we use that helps provide bp machines to pts. I gave Cathey the information and she will pass it along.

## 2019-01-04 ENCOUNTER — Telehealth: Payer: Self-pay | Admitting: Licensed Clinical Social Worker

## 2019-01-04 NOTE — Telephone Encounter (Signed)
CSW referred to assist patient with obtaining a BP cuff. CSW contacted patient to inform cuff will be delivered to home although unable to leave a message. CSW available as needed. Raquel Sarna, [place], [place]

## 2019-01-25 DIAGNOSIS — E1165 Type 2 diabetes mellitus with hyperglycemia: Secondary | ICD-10-CM | POA: Diagnosis not present

## 2019-01-25 DIAGNOSIS — I5032 Chronic diastolic (congestive) heart failure: Secondary | ICD-10-CM | POA: Diagnosis not present

## 2019-02-21 DIAGNOSIS — I5032 Chronic diastolic (congestive) heart failure: Secondary | ICD-10-CM | POA: Diagnosis not present

## 2019-02-21 DIAGNOSIS — E1165 Type 2 diabetes mellitus with hyperglycemia: Secondary | ICD-10-CM | POA: Diagnosis not present

## 2019-03-24 DIAGNOSIS — I5032 Chronic diastolic (congestive) heart failure: Secondary | ICD-10-CM | POA: Diagnosis not present

## 2019-03-24 DIAGNOSIS — I872 Venous insufficiency (chronic) (peripheral): Secondary | ICD-10-CM | POA: Diagnosis not present

## 2019-03-28 DIAGNOSIS — G8190 Hemiplegia, unspecified affecting unspecified side: Secondary | ICD-10-CM | POA: Diagnosis not present

## 2019-03-28 DIAGNOSIS — Z1331 Encounter for screening for depression: Secondary | ICD-10-CM | POA: Diagnosis not present

## 2019-03-28 DIAGNOSIS — I5032 Chronic diastolic (congestive) heart failure: Secondary | ICD-10-CM | POA: Diagnosis not present

## 2019-03-28 DIAGNOSIS — E1142 Type 2 diabetes mellitus with diabetic polyneuropathy: Secondary | ICD-10-CM | POA: Diagnosis not present

## 2019-03-28 DIAGNOSIS — Z1389 Encounter for screening for other disorder: Secondary | ICD-10-CM | POA: Diagnosis not present

## 2019-03-28 DIAGNOSIS — Z0001 Encounter for general adult medical examination with abnormal findings: Secondary | ICD-10-CM | POA: Diagnosis not present

## 2019-04-15 IMAGING — CR DG CHEST 1V PORT
1 series · 1 of 1 positions shown · non-contrast
Comparison: May 02, 2015

CLINICAL DATA: Shortness of Breath

EXAM:
PORTABLE CHEST 1 VIEW

[portable]
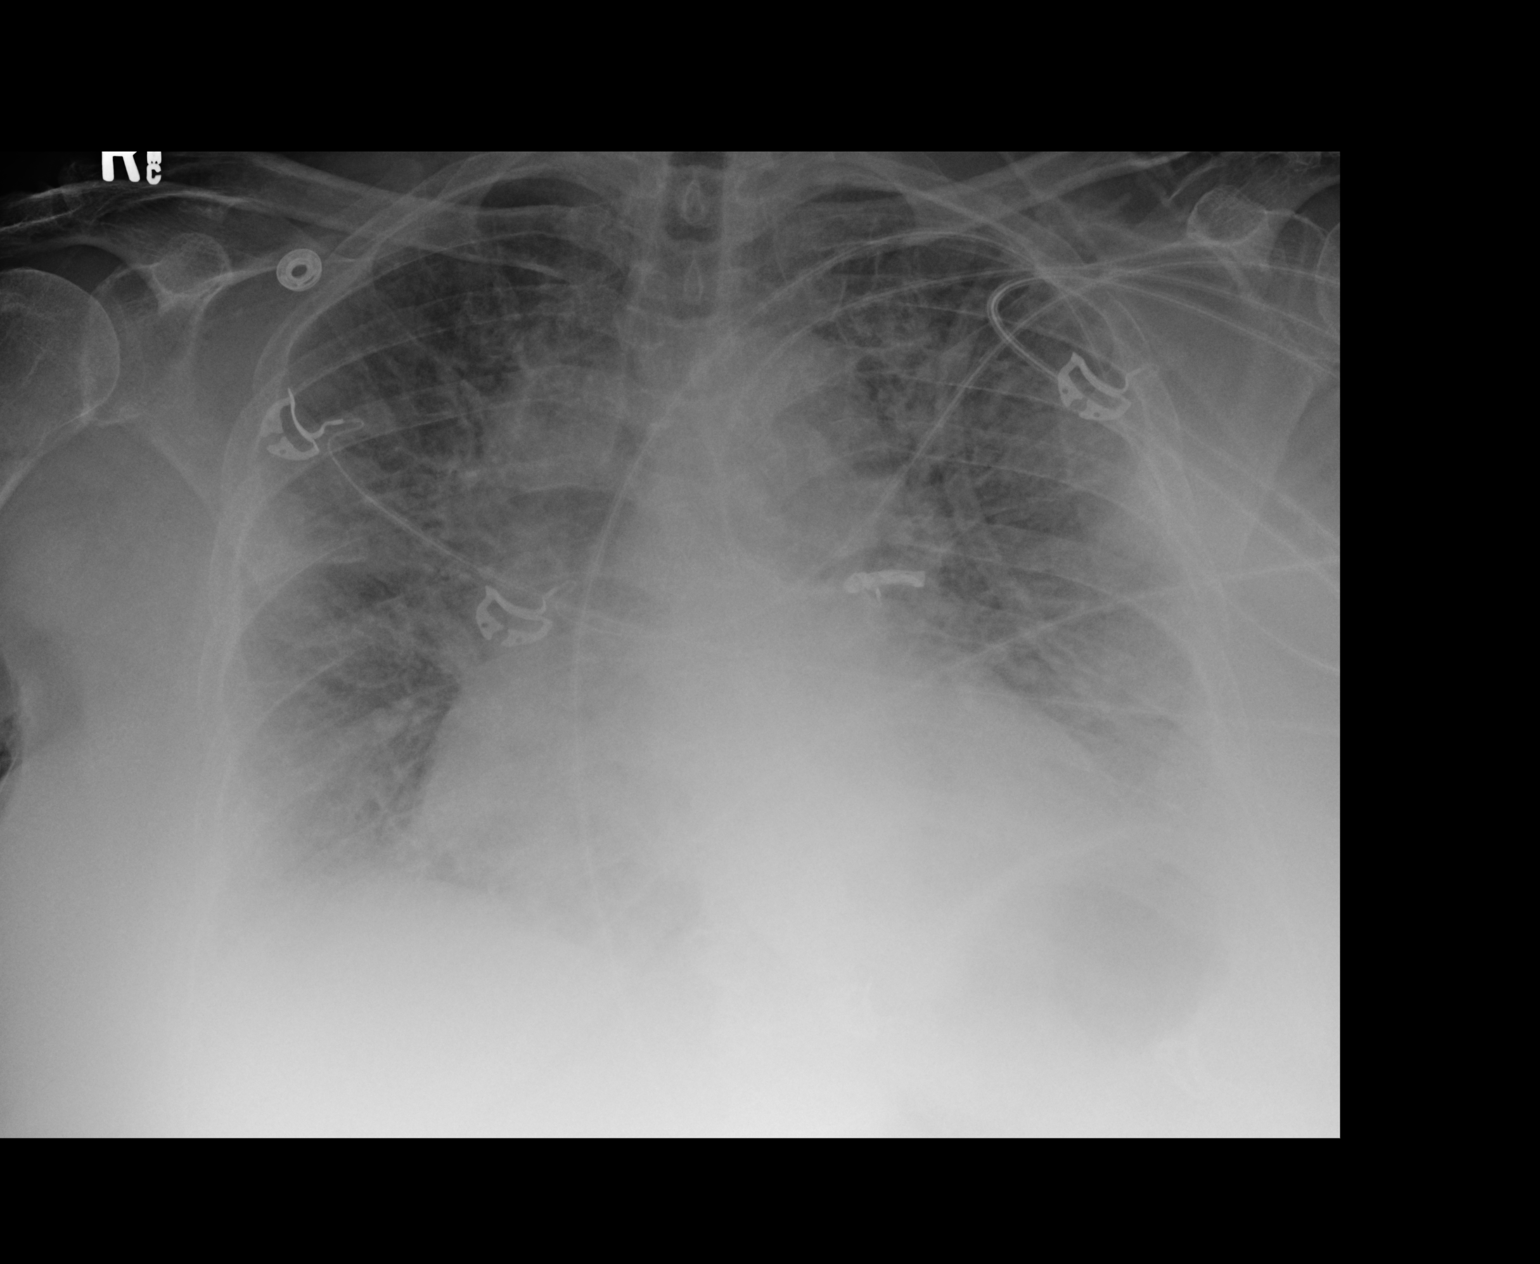

[1 of 1 positions shown; findings below may reference images not displayed]

FINDINGS: There is cardiomegaly with pulmonary venous hypertension. There is
interstitial pulmonary edema with small pleural effusion on the
right. There is a focus of airspace opacity in the right mid lung
consistent with either alveolar edema or small focus of pneumonia.
IMPRESSION: Findings felt to be indicative of congestive heart failure with
interstitial edema and pulmonary vascular congestion. Small right
pleural effusion.

Focal area of airspace opacity in the right mid lung. Question focus
of alveolar edema versus small focus of superimposed pneumonia.

## 2019-04-20 ENCOUNTER — Emergency Department (HOSPITAL_COMMUNITY): Payer: Medicare HMO

## 2019-04-20 ENCOUNTER — Emergency Department (HOSPITAL_COMMUNITY)
Admission: EM | Admit: 2019-04-20 | Discharge: 2019-04-21 | Disposition: A | Discharge status: Home / Self Care | Payer: Medicare HMO | Attending: Emergency Medicine | Admitting: Emergency Medicine

## 2019-04-20 ENCOUNTER — Encounter (HOSPITAL_COMMUNITY): Payer: Self-pay | Admitting: Emergency Medicine

## 2019-04-20 ENCOUNTER — Other Ambulatory Visit: Payer: Self-pay

## 2019-04-20 DIAGNOSIS — K643 Fourth degree hemorrhoids: Secondary | ICD-10-CM | POA: Diagnosis not present

## 2019-04-20 DIAGNOSIS — I10 Essential (primary) hypertension: Secondary | ICD-10-CM | POA: Diagnosis not present

## 2019-04-20 DIAGNOSIS — K649 Unspecified hemorrhoids: Secondary | ICD-10-CM | POA: Diagnosis present

## 2019-04-20 DIAGNOSIS — Z79899 Other long term (current) drug therapy: Secondary | ICD-10-CM | POA: Insufficient documentation

## 2019-04-20 DIAGNOSIS — E1122 Type 2 diabetes mellitus with diabetic chronic kidney disease: Secondary | ICD-10-CM | POA: Diagnosis not present

## 2019-04-20 DIAGNOSIS — R109 Unspecified abdominal pain: Secondary | ICD-10-CM | POA: Insufficient documentation

## 2019-04-20 DIAGNOSIS — Z794 Long term (current) use of insulin: Secondary | ICD-10-CM | POA: Diagnosis not present

## 2019-04-20 DIAGNOSIS — I129 Hypertensive chronic kidney disease with stage 1 through stage 4 chronic kidney disease, or unspecified chronic kidney disease: Secondary | ICD-10-CM | POA: Insufficient documentation

## 2019-04-20 DIAGNOSIS — N183 Chronic kidney disease, stage 3 unspecified: Secondary | ICD-10-CM | POA: Insufficient documentation

## 2019-04-20 DIAGNOSIS — E1140 Type 2 diabetes mellitus with diabetic neuropathy, unspecified: Secondary | ICD-10-CM | POA: Insufficient documentation

## 2019-04-20 DIAGNOSIS — Z8673 Personal history of transient ischemic attack (TIA), and cerebral infarction without residual deficits: Secondary | ICD-10-CM | POA: Insufficient documentation

## 2019-04-20 DIAGNOSIS — N390 Urinary tract infection, site not specified: Secondary | ICD-10-CM

## 2019-04-20 DIAGNOSIS — R58 Hemorrhage, not elsewhere classified: Secondary | ICD-10-CM | POA: Diagnosis not present

## 2019-04-20 DIAGNOSIS — N200 Calculus of kidney: Secondary | ICD-10-CM | POA: Diagnosis not present

## 2019-04-20 LAB — CBC WITH DIFFERENTIAL/PLATELET
Abs Immature Granulocytes: 0.02 10*3/uL (ref 0.00–0.07)
Basophils Absolute: 0 10*3/uL (ref 0.0–0.1)
Basophils Relative: 0 %
Eosinophils Absolute: 0.2 10*3/uL (ref 0.0–0.5)
Eosinophils Relative: 2 %
HCT: 34.7 % — ABNORMAL LOW (ref 36.0–46.0)
Hemoglobin: 10.6 g/dL — ABNORMAL LOW (ref 12.0–15.0)
Immature Granulocytes: 0 %
Lymphocytes Relative: 27 %
Lymphs Abs: 2.6 10*3/uL (ref 0.7–4.0)
MCH: 27.7 pg (ref 26.0–34.0)
MCHC: 30.5 g/dL (ref 30.0–36.0)
MCV: 90.6 fL (ref 80.0–100.0)
Monocytes Absolute: 0.9 10*3/uL (ref 0.1–1.0)
Monocytes Relative: 10 %
Neutro Abs: 5.8 10*3/uL (ref 1.7–7.7)
Neutrophils Relative %: 61 %
Platelets: 267 10*3/uL (ref 150–400)
RBC: 3.83 MIL/uL — ABNORMAL LOW (ref 3.87–5.11)
RDW: 16.1 % — ABNORMAL HIGH (ref 11.5–15.5)
WBC: 9.6 10*3/uL (ref 4.0–10.5)
nRBC: 0 % (ref 0.0–0.2)

## 2019-04-20 LAB — URINALYSIS, ROUTINE W REFLEX MICROSCOPIC
Bilirubin Urine: NEGATIVE
Glucose, UA: 150 mg/dL — AB
Ketones, ur: NEGATIVE mg/dL
Nitrite: POSITIVE — AB
Protein, ur: 100 mg/dL — AB
Specific Gravity, Urine: 1.013 (ref 1.005–1.030)
pH: 5 (ref 5.0–8.0)

## 2019-04-20 LAB — BASIC METABOLIC PANEL
Anion gap: 8 (ref 5–15)
BUN: 23 mg/dL (ref 8–23)
CO2: 25 mmol/L (ref 22–32)
Calcium: 8.7 mg/dL — ABNORMAL LOW (ref 8.9–10.3)
Chloride: 107 mmol/L (ref 98–111)
Creatinine, Ser: 1.45 mg/dL — ABNORMAL HIGH (ref 0.44–1.00)
GFR calc Af Amer: 39 mL/min — ABNORMAL LOW (ref >60)
GFR calc non Af Amer: 33 mL/min — ABNORMAL LOW (ref >60)
Glucose, Bld: 184 mg/dL — ABNORMAL HIGH (ref 70–99)
Potassium: 3.6 mmol/L (ref 3.5–5.1)
Sodium: 140 mmol/L (ref 135–145)

## 2019-04-20 MED ORDER — ACETAMINOPHEN 500 MG PO TABS
1000.0000 mg | ORAL_TABLET | Freq: Once | ORAL | Status: AC
  Administered 2019-04-20: 1000 mg via ORAL
  Filled 2019-04-20: qty 2

## 2019-04-20 MED ORDER — LUBIPROSTONE 8 MCG PO CAPS: 8.0000 ug | ORAL_CAPSULE | Freq: Two times a day (BID) | ORAL | 0 refills | Status: DC

## 2019-04-20 MED ORDER — CEPHALEXIN 500 MG PO CAPS
1000.0000 mg | ORAL_CAPSULE | Freq: Once | ORAL | Status: AC
  Administered 2019-04-20: 1000 mg via ORAL
  Filled 2019-04-20: qty 2

## 2019-04-20 MED ORDER — HYDROCORTISONE ACETATE 25 MG RE SUPP: 25.0000 mg | Freq: Two times a day (BID) | RECTAL | 0 refills | Status: DC

## 2019-04-20 MED ORDER — CEPHALEXIN 500 MG PO CAPS: 500.0000 mg | ORAL_CAPSULE | Freq: Four times a day (QID) | ORAL | 0 refills | Status: DC

## 2019-04-20 NOTE — ED Provider Notes (Signed)
[facility] EMERGENCY DEPARTMENT Provider Note   CSN: [id number] Arrival date & time: 04/20/19  1936     History   Chief Complaint Chief Complaint  Patient presents with  . Rectal Bleeding    HPI [name] is a 84 y.o. female.     Pt presents to the ED today with bleeding hemorrhoids.  Pt has had hemorrhoids for a long time.  She feels the blood is a little more than usual.  She said blood the size of a quarter was on her toilet paper.  She sees Dr. Oneida Alar for her hemorrhoids.  She is supposed to use a "salve," but has not been using it.  She will occasionally take her constipation med.  The pt denies f/c.  She is sob chronically.      Past Medical History:  Diagnosis Date  . Arthritis   . CVA (cerebral infarction)   . Diabetes mellitus without complication ([place])   . GERD (gastroesophageal reflux disease)   . Hypertension   . Stroke [facility])     Patient Active Problem List   Diagnosis Date Noted  . Acute diastolic CHF (congestive heart failure) ([place]) 07/08/2017  . Acute respiratory failure with hypoxia ([place]) 07/08/2017  . Uncontrolled type 2 diabetes mellitus with diabetic nephropathy, with long-term current use of insulin ([place]) 07/08/2017  . CKD (chronic kidney disease), stage III 07/08/2017  . Essential hypertension 07/08/2017  . Hypertension 06/23/2017  . Prolapsed internal hemorrhoids, grade 3 06/25/2016  . Abdominal pain 05/08/2015  . Dysphagia, pharyngoesophageal phase   . Other hemorrhoids   . Diverticulosis of colon without hemorrhage   . GERD (gastroesophageal reflux disease) 02/28/2014  . Constipation 02/28/2014  . Esophageal dysphagia 02/28/2014  . Rectal bleeding 02/28/2014  . OSTEOARTHRITIS, KNEE, RIGHT, SEVERE 12/13/2008  . DEGENERATIVE DISC DISEASE, LUMBOSACRAL SPINE W/RADICULOPATHY 12/13/2008  . BACK PAIN 12/13/2008  . DIABETES 04/13/2007  . FRACTURE, FEMUR, INTERTROCHANTERIC REGION 04/13/2007    Past Surgical History:  Procedure Laterality  Date  . BACK SURGERY    . CATARACT EXTRACTION W/PHACO Left 02/28/2013   Procedure: CATARACT EXTRACTION PHACO AND INTRAOCULAR LENS PLACEMENT (IOL) CDE=15.60;  Surgeon: Elta Guadeloupe T. Gershon Crane, MD;  Location: [facility];  Service: Ophthalmology;  Laterality: Left;  . CHOLECYSTECTOMY    . COLONOSCOPY  12/12/2003   RMR: Normal rectum.  Long capacious tortuous colon, colonic mucosa appeared normal  . COLONOSCOPY N/A 03/21/2014   Dr. [name]: internal and external hemorrhoids, torturous colon, colonic diverticulosis   . ESOPHAGOGASTRODUODENOSCOPY  12/28/2001   [id number] sliding hiatal hernia/. Three small bulbar ulcers, two with stigmata of bleeding and these were coagulated using heater probe unit   . ESOPHAGOGASTRODUODENOSCOPY N/A 03/21/2014   Dr. [name]: cervical esophageal web s/p dilation, negative H.pylori   . EYE SURGERY    . HIP FRACTURE SURGERY  2008  . JOINT REPLACEMENT     Rt hip, ????? sounds like just for fracture  . MALONEY DILATION N/A 03/21/2014   Procedure: [name] DILATION;  Surgeon: [name], MD;  Location: [facility];  Service: Endoscopy;  Laterality: N/A;  . SAVORY DILATION N/A 03/21/2014   Procedure: SAVORY DILATION;  Surgeon: [name], MD;  Location: [facility];  Service: Endoscopy;  Laterality: N/A;     OB History   No obstetric history on file.      Home Medications    Prior to Admission medications   Medication Sig Start Date End Date Taking? Authorizing Provider  cephALEXin (KEFLEX) 500 MG  capsule Take 1 capsule (500 mg total) by mouth 4 (four) times daily. 04/20/19   Mesner, Corene Cornea, MD  fluticasone (FLONASE) 50 MCG/ACT nasal spray Place 1 spray into both nostrils daily. 07/15/17   Mikhail, Velta Addison, DO  furosemide (LASIX) 40 MG tablet Take 1 tablet (40 mg total) by mouth 2 (two) times daily. 07/14/17   Mikhail, Velta Addison, DO  gabapentin (NEURONTIN) 300 MG capsule Take 300 mg by mouth 2 (two) times daily.  02/22/14   [provider]  hydrocortisone  (ANUSOL-HC) 25 MG suppository Place 1 suppository (25 mg total) rectally 2 (two) times daily. 04/20/19   Isla Pence, MD  insulin aspart (NOVOLOG) 100 UNIT/ML injection Inject 30 Units into the skin 3 (three) times daily before meals.     [provider]  insulin detemir (LEVEMIR) 100 UNIT/ML injection Inject 80 Units into the skin at bedtime.     [provider]  lubiprostone (AMITIZA) 8 MCG capsule Take 1 capsule (8 mcg total) by mouth 2 (two) times daily with a meal. 04/20/19   Isla Pence, MD  pantoprazole (PROTONIX) 40 MG tablet TAKE 1 TABLET BY MOUTH ONCE DAILY. Patient taking differently: Take 40 mg by mouth daily.  07/06/16   Annitta Needs, NP  simvastatin (ZOCOR) 20 MG tablet Take 20 mg by mouth daily.    [provider]    Family History Family History  Problem Relation Age of Onset  . Crohn's disease Daughter   . Hypertension Mother   . Hypertension Sister   . Diabetes Brother   . Diabetes Sister   . Diabetes Sister   . Heart attack Sister   . Colon cancer Neg Hx     Social History Social History   Tobacco Use  . Smoking status: Never Smoker  . Smokeless tobacco: Never Used  Substance Use Topics  . Alcohol use: No  . Drug use: No     Allergies   Patient has no allergy information on record.   Review of Systems Review of Systems  Gastrointestinal: Positive for blood in stool.  All other systems reviewed and are negative.    Physical Exam Updated Vital Signs BP (!) 175/68   Pulse 87   Temp 97.8 F (36.6 C) (Oral)   Resp 20   Ht 5\' 4"  (1.626 m)   Wt 104.3 kg   SpO2 96%   BMI 39.48 kg/m   Physical Exam Vitals signs and nursing note reviewed.  Constitutional:      Appearance: Normal appearance. She is obese.  HENT:     Head: Normocephalic and atraumatic.     Right Ear: External ear normal.     Left Ear: External ear normal.     Nose: Nose normal.     Mouth/Throat:     Mouth: Mucous membranes are moist.      Pharynx: Oropharynx is clear.  Eyes:     Extraocular Movements: Extraocular movements intact.     Conjunctiva/sclera: Conjunctivae normal.     Pupils: Pupils are equal, round, and reactive to light.  Neck:     Musculoskeletal: Normal range of motion and neck supple.  Cardiovascular:     Rate and Rhythm: Normal rate and regular rhythm.     Pulses: Normal pulses.  Pulmonary:     Effort: Pulmonary effort is normal.  Abdominal:     General: Abdomen is flat. Bowel sounds are normal.     Palpations: Abdomen is soft.  Genitourinary:    Comments: Grade IV hemorrhoids  noted. Musculoskeletal: Normal range of motion.     Right lower leg: Edema present.     Left lower leg: Edema present.  Skin:    General: Skin is warm.     Capillary Refill: Capillary refill takes less than 2 seconds.  Neurological:     General: No focal deficit present.     Mental Status: She is alert and oriented to person, place, and time.  Psychiatric:        Mood and Affect: Mood normal.        Behavior: Behavior normal.      ED Treatments / Results  Labs (all labs ordered are listed, but only abnormal results are displayed) Labs Reviewed  URINE CULTURE - Abnormal; Notable for the following components:      Result Value   Culture >=100,000 COLONIES/mL ESCHERICHIA COLI (*)    Organism ID, Bacteria ESCHERICHIA COLI (*)    All other components within normal limits  BASIC METABOLIC PANEL - Abnormal; Notable for the following components:   Glucose, Bld 184 (*)    Creatinine, Ser 1.45 (*)    Calcium 8.7 (*)    GFR calc non Af Amer 33 (*)    GFR calc Af Amer 39 (*)    All other components within normal limits  CBC WITH DIFFERENTIAL/PLATELET - Abnormal; Notable for the following components:   RBC 3.83 (*)    Hemoglobin 10.6 (*)    HCT 34.7 (*)    RDW 16.1 (*)    All other components within normal limits  URINALYSIS, ROUTINE W REFLEX MICROSCOPIC - Abnormal; Notable for the following components:   APPearance  HAZY (*)    Glucose, UA 150 (*)    Hgb urine dipstick SMALL (*)    Protein, ur 100 (*)    Nitrite POSITIVE (*)    Leukocytes,Ua TRACE (*)    Bacteria, UA MANY (*)    All other components within normal limits    EKG None  Radiology No results found.  Procedures Procedures (including critical care time)  Medications Ordered in ED Medications  acetaminophen (TYLENOL) tablet 1,000 mg (1,000 mg Oral Given 04/20/19 2239)  cephALEXin (KEFLEX) capsule 1,000 mg (1,000 mg Oral Given 04/20/19 2359)     Initial Impression / Assessment and Plan / ED Course  I have reviewed the triage vital signs and the nursing notes.  Pertinent labs & imaging results that were available during my care of the patient were reviewed by me and considered in my medical decision making (see chart for details).     Pt with some left flank pain, so a CT was ordered.  Pt signed out to Dr. Dayna Barker at shift change.  Final Clinical Impressions(s) / ED Diagnoses   Final diagnoses:  Grade IV hemorrhoids  Urinary tract infection without hematuria, site unspecified    ED Discharge Orders         Ordered    lubiprostone (AMITIZA) 8 MCG capsule  2 times daily with meals     04/20/19 2135    hydrocortisone (ANUSOL-HC) 25 MG suppository  2 times daily     04/20/19 2135    cephALEXin (KEFLEX) 500 MG capsule  4 times daily     04/20/19 2343           Isla Pence, MD 04/24/19 [phone]

## 2019-04-20 NOTE — ED Triage Notes (Signed)
Pt arrive via [facility] c/o rectal bleeding x2 days from hemorrhoids. Hx of same.

## 2019-04-21 NOTE — ED Provider Notes (Signed)
4:12 AM Assumed care from Dr. Rogelia Mire d, please see their note for full history, physical and decision making until this point. In brief this is a 84 y.o. year old female who presented to the ED tonight with Rectal Bleeding    Rectal bleeding likely from hemorrhoids, but subsequently stated she had llq abdominal pain so pending ct scan. tx for UTI.   Scan ok.   meds for hemorrhoids already prescribed, added on keflex for UTI. Discussed with patient and daughter.    Discharge instructions, including strict return precautions for new or worsening symptoms, given. Patient and/or family verbalized understanding and agreement with the plan as described.   Labs, studies and imaging reviewed by myself and considered in medical decision making if ordered. Imaging interpreted by radiology.  Labs Reviewed  BASIC METABOLIC PANEL - Abnormal; Notable for the following components:      Result Value   Glucose, Bld 184 (*)    Creatinine, Ser 1.45 (*)    Calcium 8.7 (*)    GFR calc non Af Amer 33 (*)    GFR calc Af Amer 39 (*)    All other components within normal limits  CBC WITH DIFFERENTIAL/PLATELET - Abnormal; Notable for the following components:   RBC 3.83 (*)    Hemoglobin 10.6 (*)    HCT 34.7 (*)    RDW 16.1 (*)    All other components within normal limits  URINALYSIS, ROUTINE W REFLEX MICROSCOPIC - Abnormal; Notable for the following components:   APPearance HAZY (*)    Glucose, UA 150 (*)    Hgb urine dipstick SMALL (*)    Protein, ur 100 (*)    Nitrite POSITIVE (*)    Leukocytes,Ua TRACE (*)    Bacteria, UA MANY (*)    All other components within normal limits  URINE CULTURE    CT Renal Stone Study  Final Result      No follow-ups on file.    Gurjit Loconte, Corene Cornea, MD 04/21/19 [phone]

## 2019-04-23 LAB — URINE CULTURE: Culture: >=100000 — AB

## 2019-04-24 ENCOUNTER — Telehealth: Payer: Self-pay

## 2019-04-24 NOTE — Telephone Encounter (Signed)
Post ED Visit - Positive Culture Follow-up: Unsuccessful Patient Follow-up  Culture assessed and recommendations reviewed by:  []  Elenor Quinones, Pharm.D. []  Heide Guile, Pharm.D., BCPS AQ-ID []  Parks Neptune, Pharm.D., BCPS []  Alycia Rossetti, Pharm.D., BCPS []  [place], Pharm.D., BCPS, AAHIVP []  Legrand Como, Pharm.D., BCPS, AAHIVP []  Wynell Balloon, PharmD []  Vincenza Hews, PharmD, BCPS H Nelda Marseille D Positive urine culture symptom check may need fosfomycin []  Patient discharged without antimicrobial prescription and treatment is now indicated []  Organism is resistant to prescribed ED discharge antimicrobial []  Patient with positive blood cultures   Unable to contact patient after 3 attempts, letter will be sent to address on file  Genia Del 04/24/2019, 11:11 AM

## 2019-04-24 NOTE — Progress Notes (Signed)
ED Antimicrobial Stewardship Positive Culture Follow Up   [name] is an 84 y.o. female who presented to [facility] on 04/20/2019 with a chief complaint of  Chief Complaint  Patient presents with  . Rectal Bleeding    Recent Results (from the past 720 hour(s))  Urine culture     Status: Abnormal   Collection Time: 04/20/19 10:45 PM   Specimen: Urine, Clean Catch  Result Value Ref Range Status   Specimen Description   Final    URINE, CLEAN CATCH Performed at [facility], [address], [place], [place] [address]    Special Requests   Final    NONE Performed at [facility], [address] [place],  [address]    Culture >=100,000 COLONIES/mL ESCHERICHIA COLI (A)  Final   Report Status 04/23/2019 FINAL  Final   Organism ID, Bacteria ESCHERICHIA COLI (A)  Final      Susceptibility   Escherichia coli - MIC*    AMPICILLIN >=32 RESISTANT Resistant     CEFAZOLIN >=64 RESISTANT Resistant     CEFTRIAXONE 2 SENSITIVE Sensitive     CIPROFLOXACIN <=0.25 SENSITIVE Sensitive     GENTAMICIN <=1 SENSITIVE Sensitive     IMIPENEM <=0.25 SENSITIVE Sensitive     NITROFURANTOIN <=16 SENSITIVE Sensitive     TRIMETH/SULFA <=20 SENSITIVE Sensitive     AMPICILLIN/SULBACTAM >=32 RESISTANT Resistant     PIP/TAZO 64 INTERMEDIATE Intermediate     Extended ESBL NEGATIVE Sensitive     * >=100,000 COLONIES/mL ESCHERICHIA COLI    [x]  Treated with Keflex, organism resistant to prescribed antimicrobial  New antibiotic prescription: Flow Manager to call patient. If no urinary symptoms, then no further treatment indicated due to patient likely colonized with this organism. If urinary symptoms reported, stop Keflex and start fosfomycin 3g PO x 1 dose.  ED Provider: Wyn Quaker, PA-C   [name] P 04/24/2019, 9:06 AM Clinical Pharmacist Monday - Friday phone -  [phone] Saturday - Sunday phone - [phone]

## 2019-04-27 DIAGNOSIS — E1142 Type 2 diabetes mellitus with diabetic polyneuropathy: Secondary | ICD-10-CM | POA: Diagnosis not present

## 2019-04-27 DIAGNOSIS — I5032 Chronic diastolic (congestive) heart failure: Secondary | ICD-10-CM | POA: Diagnosis not present

## 2019-04-28 ENCOUNTER — Telehealth: Payer: Self-pay | Admitting: Emergency Medicine

## 2019-04-28 NOTE — Telephone Encounter (Signed)
Post ED Visit - Positive Culture Follow-up: Successful Patient Follow-Up  Culture assessed and recommendations reviewed by:  []  Elenor Quinones, Pharm.D. []  Heide Guile, Pharm.D., BCPS AQ-ID []  Parks Neptune, Pharm.D., BCPS []  Alycia Rossetti, Pharm.D., BCPS []  [place], Pharm.D., BCPS, AAHIVP []  Legrand Como, Pharm.D., BCPS, AAHIVP []  Salome Arnt, PharmD, BCPS []  Johnnette Gourd, PharmD, BCPS []  Hughes Better, PharmD, BCPS [x]  [name], PharmD  Positive urine culture  []  Patient discharged without antimicrobial prescription and treatment is now indicated [x]  Organism is resistant to prescribed ED discharge antimicrobial []  Patient with positive blood cultures  Changes discussed with ED provider: Wyn Quaker, PA New antibiotic prescription:: If symptomatic - Fosfomycin 3 g PO x one dose   Contacted patient, date 04/28/2019, time 1050 Patient called after receiving letter. Patient denies any pain, burning or frequency with urination or other UTI symptoms.    [name] 04/28/2019, 12:05 PM

## 2019-05-28 DIAGNOSIS — K219 Gastro-esophageal reflux disease without esophagitis: Secondary | ICD-10-CM | POA: Diagnosis not present

## 2019-05-28 DIAGNOSIS — I5032 Chronic diastolic (congestive) heart failure: Secondary | ICD-10-CM | POA: Diagnosis not present

## 2019-06-06 ENCOUNTER — Encounter: Payer: Self-pay | Admitting: Internal Medicine

## 2019-06-18 ENCOUNTER — Encounter: Payer: Self-pay | Admitting: Gastroenterology

## 2019-06-18 NOTE — Progress Notes (Signed)
Referring Provider: [name], MD Primary Care Physician:  [name], MD Primary GI Physician: Dr. [name]  Chief Complaint  Patient presents with  . Rectal Bleeding    Bleeding after bm's    HPI:   [name] is a 85 y.o. female with a GI history significant for GERD, dysphagia with last EGD/ED revealing cervical web in 2015, constipation, intermittent rectal bleeding in the setting of hemorrhoids. Last TCS in November 2015 with internal and external hemorrhoids (likely source of hematochezia), and diverticulosis. S/p right posterior hemorrhoidal column banding in 2018. At her last visit in Feb 2019, she did report return of bright red blood per rectum from hemorrhoids, but was not ready to pursue hemorrhoid banding at that time.  Otherwise, other GI symptoms including GERD and constipation were well controlled at her last office visit.  She was advised to continue her current medications including Protonix 40 mg daily, Amitiza 8 mcg 1-2 times daily as needed, and call for hemorrhoid banding when desired.   Patient presented to the ED on 04/20/19 for bleeding hemorrhoids.  Reported she felt the blood was a little more than usual.  She said blood the size of a quarter was on toilet paper.  Was not using "salve".  Also noted left flank pain so CT was ordered.  CT without acute findings.  CBC with hemoglobin 10.6L and hematocrit 34.7L, otherwise essentially normal.  BMP with glucose 184H, creatinine 1.45H, calcium 8.7L, otherwise normal.  Urine culture positive for E. coli.  She was treated with Keflex for UTI, prescribed Anusol suppositories, and Amitiza 8 mcg for constipation.  Advised to follow-up with GI and PCP.   Today: Went to the hospital in December because she was having rectal pain and her hemorrhoid was very swollen on the outside. States hemorrhoids are not normally on the outside but when sitting on the toilet, they almost always prolapse. Every time she has a BM, there is  a small amount of blood on the toilet tissue, between a dime size and a quarter size. This has been present since we saw her in 2019. No blood in the stool or toilet water. When the hemorrhoids were swollen, she states she had blood dripping on the floor. This was the same day she presented to the ED. This only lasted one day. Did not use suppositories prescribed by ED due to cost.    About 8 days ago, she had another flare up with swollen hemorrhoid and blood dripping in the floor. "Used some stuff and out a hot bath cloth on them." This resolved after one day. Using something overt the counter for hemorrhoids as needed. Not sure what this is. Doesn't feel like it is swollen now. No current rectal pain or discomfort. Sometimes she has 2 BM a day and sometimes none. Doesn't feel like she is constipation. At most, will go 2-3 days without a BM. Sometimes will pass small balls of stool. No straining. Not taking Amitiza. States her bowels move fine. No abdominal pain. No unintentional weight loss.     No lightheadedness, dizziness, pre-syncope, or syncope. No NSAIDs.     States, "I am ready to get these hemorrhoids taken care of."   BP is elevated at 206/94. States it is not normally elevated at rest but when she moves around much, it increases. No chest pain, heart palpitations, blurry vision, or headache. She has had 2-3 months of significant shortness of breath with minimal exertion. Rarely at rest. Occasional  cough. Some expiratory wheeze. No history of COPD or asthma. She does have diastolic heart failure. Hasn't seen PCP about this. No sick contacts. No fever, chills, or other cold or flu like symptoms.   She has been using a walker for a while. Not able to ambulate without assistance even with a walker. She is in a wheelchair today requiring 2 person assist to transfer to wheelchair.  Past Medical History:  Diagnosis Date  . Arthritis   . Chronic diastolic heart failure ([place])   . CKD (chronic  kidney disease)   . CVA (cerebral infarction)   . Diabetes mellitus without complication ([place])   . GERD (gastroesophageal reflux disease)   . Hemorrhoids   . Hypertension   . Stroke [facility])     Past Surgical History:  Procedure Laterality Date  . BACK SURGERY    . CATARACT EXTRACTION W/PHACO Left 02/28/2013   Procedure: CATARACT EXTRACTION PHACO AND INTRAOCULAR LENS PLACEMENT (IOL) CDE=15.60;  Surgeon: Elta Guadeloupe T. Gershon Crane, MD;  Location: [facility] ORS;  Service: Ophthalmology;  Laterality: Left;  . CHOLECYSTECTOMY    . COLONOSCOPY  12/12/2003   RMR: Normal rectum.  Long capacious tortuous colon, colonic mucosa appeared normal  . COLONOSCOPY N/A 03/21/2014   Dr. [name]: internal and external hemorrhoids, torturous colon, colonic diverticulosis   . ESOPHAGOGASTRODUODENOSCOPY  12/28/2001   [id number] sliding hiatal hernia/. Three small bulbar ulcers, two with stigmata of bleeding and these were coagulated using heater probe unit   . ESOPHAGOGASTRODUODENOSCOPY N/A 03/21/2014   Dr. [name]: cervical esophageal web s/p dilation, negative H.pylori   . EYE SURGERY    . HIP FRACTURE SURGERY  2008  . JOINT REPLACEMENT     Rt hip, ????? sounds like just for fracture  . MALONEY DILATION N/A 03/21/2014   Procedure: [name] DILATION;  Surgeon: [name], MD;  Location: [facility] ENDO SUITE;  Service: Endoscopy;  Laterality: N/A;  . SAVORY DILATION N/A 03/21/2014   Procedure: SAVORY DILATION;  Surgeon: [name], MD;  Location: [facility] END[facility];  Service: Endoscopy;  Laterality: N/A;    Current Outpatient Medications  Medication Sig Dispense Refill  . fluticasone (FLONASE) 50 MCG/ACT nasal spray Place 1 spray into both nostrils daily. 16 g 0  . furosemide (LASIX) 40 MG tablet Take 1 tablet (40 mg total) by mouth 2 (two) times daily. 60 tablet 0  . gabapentin (NEURONTIN) 300 MG capsule Take 300 mg by mouth 2 (two) times daily.     . insulin aspart (NOVOLOG) 100 UNIT/ML injection Inject 30 Units into the skin 3  (three) times daily before meals.     . insulin detemir (LEVEMIR) 100 UNIT/ML injection Inject 80 Units into the skin at bedtime.     . pantoprazole (PROTONIX) 40 MG tablet TAKE 1 TABLET BY MOUTH ONCE DAILY. (Patient taking differently: Take 40 mg by mouth daily. ) 90 tablet 3  . simvastatin (ZOCOR) 20 MG tablet Take 20 mg by mouth daily.    . hydrocortisone (ANUSOL-HC) 2.5 % rectal cream Place 1 application rectally 2 (two) times daily. 30 g 1   No current facility-administered medications for this visit.    Allergies as of 06/19/2019  . (Not on File)    Family History  Problem Relation Age of Onset  . Crohn's disease Daughter   . Hypertension Mother   . Hypertension Sister   . Diabetes Brother   . Diabetes Sister   . Diabetes Sister   . Heart attack Sister   . Colon  cancer Neg Hx     Social History   Socioeconomic History  . Marital status: Widowed    Spouse name: Not on file  . Number of children: 8  . Years of education: Not on file  . Highest education level: Not on file  Occupational History  . Not on file  Tobacco Use  . Smoking status: Never Smoker  . Smokeless tobacco: Never Used  Substance and Sexual Activity  . Alcohol use: No  . Drug use: No  . Sexual activity: Not on file  Other Topics Concern  . Not on file  Social History Narrative  . Not on file   Social Determinants of Health   Financial Resource Strain:   . Difficulty of Paying Living Expenses: Not on file  Food Insecurity:   . Worried About Charity fundraiser in the Last Year: Not on file  . Ran Out of Food in the Last Year: Not on file  Transportation Needs:   . Lack of Transportation (Medical): Not on file  . Lack of Transportation (Non-Medical): Not on file  Physical Activity:   . Days of Exercise per Week: Not on file  . Minutes of Exercise per Session: Not on file  Stress:   . Feeling of Stress : Not on file  Social Connections:   . Frequency of Communication with Friends and  Family: Not on file  . Frequency of Social Gatherings with Friends and Family: Not on file  . Attends Religious Services: Not on file  . Active Member of Clubs or Organizations: Not on file  . Attends Archivist Meetings: Not on file  . Marital Status: Not on file    Review of Systems: Gen: See HPI CV: See HPI Resp: See HPI GI: See HPI Derm: Denies rash Heme: See HPI  Physical Exam: BP (!) 187/83   Pulse 89   Temp (!) 96.8 F (36 C) (Temporal)   Ht 5\' 6"  (1.676 m)   Wt 240 lb 3.2 oz (109 kg)   BMI 38.77 kg/m  General:   Alert and oriented. Pleasant and cooperative. In a wheel chair. Unable to transfer to exam table safely with 1 person assist.  Head:  Normocephalic and atraumatic. Eyes:  Conjuctiva clear without scleral icterus. Heart:  S1, S2 present without murmurs appreciated. Lungs:  Clear to auscultation bilaterally. Mild expiratory wheezes. No rales or rhonchi. No distress.  Abdomen:  +BS, soft, non-tender and non-distended. No rebound or guarding. No HSM or masses noted. Rectal: Deferred due to inability to get patient on exam table with single person assist. Will complete this at next visit in 4-6 weeks at the time of possible hemorrhoid banding with 2 person assist.  Msk:  Symmetrical without gross deformities.  Extremities:  With 2-3+ bilateral LE pitting edema up to knees. Per patient, this is chronic and unchanged. Also with venous stasis skin changes. No ulcerations or oozing or weeping. Neurologic:  Alert and  oriented x4 Psych: Normal mood and affect.

## 2019-06-19 ENCOUNTER — Ambulatory Visit (INDEPENDENT_AMBULATORY_CARE_PROVIDER_SITE_OTHER): Payer: Medicare HMO | Admitting: Gastroenterology

## 2019-06-19 ENCOUNTER — Other Ambulatory Visit: Payer: Self-pay

## 2019-06-19 ENCOUNTER — Encounter: Payer: Self-pay | Admitting: Gastroenterology

## 2019-06-19 VITALS — BP 187/83 | HR 89 | Temp 96.8°F | Ht 66.0 in | Wt 240.2 lb

## 2019-06-19 DIAGNOSIS — I10 Essential (primary) hypertension: Secondary | ICD-10-CM | POA: Diagnosis not present

## 2019-06-19 DIAGNOSIS — K625 Hemorrhage of anus and rectum: Secondary | ICD-10-CM

## 2019-06-19 DIAGNOSIS — R0602 Shortness of breath: Secondary | ICD-10-CM | POA: Insufficient documentation

## 2019-06-19 DIAGNOSIS — K5900 Constipation, unspecified: Secondary | ICD-10-CM

## 2019-06-19 DIAGNOSIS — K642 Third degree hemorrhoids: Secondary | ICD-10-CM

## 2019-06-19 MED ORDER — HYDROCORTISONE (PERIANAL) 2.5 % EX CREA: 1.0000 "application " | TOPICAL_CREAM | Freq: Two times a day (BID) | CUTANEOUS | 1 refills | Status: DC

## 2019-06-19 NOTE — Assessment & Plan Note (Addendum)
History of hypertension.  Patient's blood pressure was significantly elevated today.  Initially 204/94 which was after walking and transferring to the wheelchair requiring 2 person assist.  After resting a short time, blood pressure improved to 187/83.  She is asymptomatic from this.  She does have 2-3+ lower extremity edema up to her knees which she states is chronic. Known history of diastolic heart failure.  She is taking her Lasix as prescribed.   Patient was advised to call her PCP ASAP to follow-up on elevated BP and LE edema. She was also advised to contact cardiology.

## 2019-06-19 NOTE — Assessment & Plan Note (Addendum)
85 year old female reporting worsening shortness of breath with exertion x 2-3 months.  Rare shortness of breath at rest.  Occasional cough.  Denies fever, chills, or other cold or flulike symptoms.  Denies sick contacts.  No history of COPD or asthma but she does have history of diastolic heart failure with 2-3+ bilateral LE pitting edema on exam today. Per patient, this is chronic and unchanged.  Lungs clear to auscultation but mild expiratory wheezing noted. She has not contacted her PCP or cardiology about this.   Patient was advised to call PCP and cardiology ASAP for further evaluation.

## 2019-06-19 NOTE — Patient Instructions (Addendum)
Have labs completed.  Start Anusol cream twice daily for the next 10 days then as needed for rectal bleeding.  Apply a small pea-sized amount to the inside of your rectum.  Wear gloves and wash your hands after.  Add Benefiber or Metamucil daily.  Start MiraLAX 1 capful daily (17 g) in 8 ounces of water for constipation.  Do not sit on the toilet for more than 2-3 minutes.  We will follow up with you in 4-6 weeks for possible hemorrhoid banding.  Please call your primary care ASAP to follow-up on your elevated blood pressure and shortness of breath.  You also need to call your cardiologist ASAP to follow-up for shortness of breath and swelling in your lower legs.  [name], PA-C [facility]

## 2019-06-19 NOTE — Assessment & Plan Note (Signed)
Addressed under rectal bleeding 

## 2019-06-19 NOTE — Assessment & Plan Note (Addendum)
Patient denies constipation but reports no BMs for 2-3 days at times and passing small balls of stool intermittently. She has history of grade 3 prolapsing internal hemorrhoids with daily toilet tissue hematochezia since last seen in 2019. Prior hemorrhoid banding of right posterior hemorrhoidal column in 2018. Recently had two episodes of hemorrhoid swelling in December 2020 and January 2021 with associated increase in rectal bleeding with blood dripping on the floor as addressed above under rectal bleeding. Denies abdominal pain or unintentional weight loss.   Although patient denies constipation, I suspect mild intermittent constipation may be contributing to recent hemorrhoid flares.   Advised to add Benefiber or Metamucil daily. Start MiraLAX 1 capful daily (17 g) in 8 ounces of water for constipation. Decrease if frequent loose stools.  She will need a hemorrhoid banding in the near future. Will treat hemorrhoids symptomatically for now as per above. Follow-up in 4-6 weeks for possible hemorrhoid banding.

## 2019-06-19 NOTE — Assessment & Plan Note (Addendum)
85 year old female with history of toilet tissue hematochezia in the setting of prolapsing internal hemorrhoids with one prior hemorrhoid banding in 2018 of the right posterior hemorrhoid column presenting today for rectal bleeding. States since she was last seen in 2019, she has continued with low volume toilet tissue hematochezia with every BM. Recently in December 2020 and January 2020 she had a hemorrhoid flare with swollen prolapsed hemorrhoids and blood dripping on the floor. Each episode with associated rectal pain and symptoms only lasting 24 hours. Seen in the ED for first episode on 04/20/19 with grade IV hemorrhoids noted. CT at that time for reported left flank pain unrevealing. CBC with hemoglobin 10.6L with normocytic indices. Last hrmoglobin prior to this was 1 year prior which was 12.5; however hemoglobin was in the 10 range in February 2019 as well. She also has history of CKD which is likely playing a role. Denies constipation although reports she may go 2-3 days without a BM at times and also passes small balls of stool at times. Last TCS in 2015 with internal and external hemorrhoids, colonic diverticulosis, no polyps. She has no history of colon polyps on prior TCS. Denies abdominal pain, unintentional weight loss, lightheadedness, pre-syncope, or syncope. Unable to complete rectal exam today as patient was unable to get up on exam table. She will need 2 person assist at next visit.   As patient has known toilet tissue hematochezia in the setting of prolaped hemorrhsoids which were notably flared at the time of worsening rectal bleeding, I do not feel she needs another colonoscopy at the age of 21. We will plan to treat symptomatically and have her follow-up in 4-6 weeks for a hemorrhoid banding. I will also recheck CBC to ensure stability. Additionally, patient is having worsening shortness of breath with exertion as well as asymptomatic elevated BP today which would need to be evaluated prior  to scheduling any invasive procedures anyway.   Update CBC Start Anusol rectal cream BID x 10 days then as needed for rectal bleeding/hemorrhoid flare.  Add benefiber or metamucil daily. MiraLAX 1 capful (17g) daily in 8 oz of water for suspected underlying constipation. Decrease frequency if loose stools develop.  Limit toilet time to 2-3 minutes.  Follow-up in 4-6 weeks for possible hemorrhoid banding.  Patient was advised to call her PCP ASAP to follow-up on elevated BP and shortness of breath. She was also advised to see cardiology for follow-up.

## 2019-06-20 NOTE — Progress Notes (Signed)
Cc'ed to pcp °

## 2019-06-23 ENCOUNTER — Telehealth: Payer: Self-pay | Admitting: Cardiovascular Disease

## 2019-06-23 ENCOUNTER — Encounter: Payer: Self-pay | Admitting: *Deleted

## 2019-06-23 ENCOUNTER — Ambulatory Visit (HOSPITAL_COMMUNITY)
Admission: RE | Admit: 2019-06-23 | Discharge: 2019-06-23 | Disposition: A | Discharge status: Home / Self Care | Payer: Medicare HMO | Source: Ambulatory Visit | Attending: Cardiovascular Disease | Admitting: Cardiovascular Disease

## 2019-06-23 ENCOUNTER — Ambulatory Visit (INDEPENDENT_AMBULATORY_CARE_PROVIDER_SITE_OTHER): Payer: Medicare HMO | Admitting: Cardiovascular Disease

## 2019-06-23 ENCOUNTER — Other Ambulatory Visit (HOSPITAL_COMMUNITY)
Admission: RE | Admit: 2019-06-23 | Discharge: 2019-06-23 | Disposition: A | Discharge status: Home / Self Care | Payer: Medicare HMO | Source: Ambulatory Visit | Attending: Cardiovascular Disease | Admitting: Cardiovascular Disease

## 2019-06-23 ENCOUNTER — Encounter: Payer: Self-pay | Admitting: Cardiovascular Disease

## 2019-06-23 ENCOUNTER — Other Ambulatory Visit: Payer: Self-pay

## 2019-06-23 VITALS — BP 188/85 | HR 60 | Temp 97.4°F | Ht 65.0 in

## 2019-06-23 DIAGNOSIS — N183 Chronic kidney disease, stage 3 unspecified: Secondary | ICD-10-CM

## 2019-06-23 DIAGNOSIS — R0602 Shortness of breath: Secondary | ICD-10-CM

## 2019-06-23 DIAGNOSIS — R0600 Dyspnea, unspecified: Secondary | ICD-10-CM

## 2019-06-23 DIAGNOSIS — I5032 Chronic diastolic (congestive) heart failure: Secondary | ICD-10-CM

## 2019-06-23 DIAGNOSIS — I447 Left bundle-branch block, unspecified: Secondary | ICD-10-CM | POA: Diagnosis not present

## 2019-06-23 DIAGNOSIS — I10 Essential (primary) hypertension: Secondary | ICD-10-CM

## 2019-06-23 DIAGNOSIS — R0609 Other forms of dyspnea: Secondary | ICD-10-CM

## 2019-06-23 LAB — CBC
HCT: 37 % (ref 36.0–46.0)
Hemoglobin: 11.4 g/dL — ABNORMAL LOW (ref 12.0–15.0)
MCH: 28 pg (ref 26.0–34.0)
MCHC: 30.8 g/dL (ref 30.0–36.0)
MCV: 90.9 fL (ref 80.0–100.0)
Platelets: 272 10*3/uL (ref 150–400)
RBC: 4.07 MIL/uL (ref 3.87–5.11)
RDW: 16.4 % — ABNORMAL HIGH (ref 11.5–15.5)
WBC: 10.2 10*3/uL (ref 4.0–10.5)
nRBC: 0 % (ref 0.0–0.2)

## 2019-06-23 LAB — BASIC METABOLIC PANEL
Anion gap: 10 (ref 5–15)
BUN: 25 mg/dL — ABNORMAL HIGH (ref 8–23)
CO2: 24 mmol/L (ref 22–32)
Calcium: 9 mg/dL (ref 8.9–10.3)
Chloride: 108 mmol/L (ref 98–111)
Creatinine, Ser: 1.44 mg/dL — ABNORMAL HIGH (ref 0.44–1.00)
GFR calc Af Amer: 39 mL/min — ABNORMAL LOW (ref >60)
GFR calc non Af Amer: 33 mL/min — ABNORMAL LOW (ref >60)
Glucose, Bld: 218 mg/dL — ABNORMAL HIGH (ref 70–99)
Potassium: 3.6 mmol/L (ref 3.5–5.1)
Sodium: 142 mmol/L (ref 135–145)

## 2019-06-23 LAB — BRAIN NATRIURETIC PEPTIDE: B Natriuretic Peptide: 118 pg/mL — ABNORMAL HIGH (ref 0.0–100.0)

## 2019-06-23 MED ORDER — HYDRALAZINE HCL 50 MG PO TABS: 75.0000 mg | ORAL_TABLET | Freq: Two times a day (BID) | ORAL | 11 refills | Status: DC

## 2019-06-23 NOTE — Progress Notes (Signed)
SUBJECTIVE: The patient presents for follow up of chronic diastolic heart failure.   She has had worsening shortness of breath over the past 2 weeks.  It can occur at rest or when walking short distances.  Her belly feels mildly distended.  She denies chest pain per se.  Chronic bilateral lower extremity edema has not gotten any worse.  She was reportedly supposed to have blood work done on 06/19/2019 but she failed to get this done.  Hemoglobin was 10.6 on 04/20/2019.  Blood pressure is markedly elevated.      Review of Systems: As per "subjective", otherwise negative.  No Known Allergies  Current Outpatient Medications  Medication Sig Dispense Refill  . fluticasone (FLONASE) 50 MCG/ACT nasal spray Place 1 spray into both nostrils daily. 16 g 0  . gabapentin (NEURONTIN) 300 MG capsule Take 300 mg by mouth 2 (two) times daily.     . hydrALAZINE (APRESOLINE) 50 MG tablet Take 50 mg by mouth 2 (two) times daily.    . hydrocortisone (ANUSOL-HC) 2.5 % rectal cream Place 1 application rectally 2 (two) times daily. 30 g 1  . insulin aspart (NOVOLOG) 100 UNIT/ML injection Inject 30 Units into the skin 3 (three) times daily before meals.     . insulin detemir (LEVEMIR) 100 UNIT/ML injection Inject 80 Units into the skin at bedtime.     . pantoprazole (PROTONIX) 40 MG tablet TAKE 1 TABLET BY MOUTH ONCE DAILY. (Patient taking differently: Take 40 mg by mouth daily. ) 90 tablet 3  . simvastatin (ZOCOR) 20 MG tablet Take 20 mg by mouth daily.    Marland Kitchen torsemide (DEMADEX) 20 MG tablet Take 20 mg by mouth daily.     No current facility-administered medications for this visit.    Past Medical History:  Diagnosis Date  . Arthritis   . Chronic diastolic heart failure ([place])   . CKD (chronic kidney disease)   . CVA (cerebral infarction)   . Diabetes mellitus without complication ([place])   . GERD (gastroesophageal reflux disease)   . Hemorrhoids   . Hypertension   . Stroke [facility])     Past  Surgical History:  Procedure Laterality Date  . BACK SURGERY    . CATARACT EXTRACTION W/PHACO Left 02/28/2013   Procedure: CATARACT EXTRACTION PHACO AND INTRAOCULAR LENS PLACEMENT (IOL) CDE=15.60;  Surgeon: Elta Guadeloupe T. Gershon Crane, MD;  Location: [facility] ORS;  Service: Ophthalmology;  Laterality: Left;  . CHOLECYSTECTOMY    . COLONOSCOPY  12/12/2003   RMR: Normal rectum.  Long capacious tortuous colon, colonic mucosa appeared normal  . COLONOSCOPY N/A 03/21/2014   Dr. [name]: internal and external hemorrhoids, torturous colon, colonic diverticulosis   . ESOPHAGOGASTRODUODENOSCOPY  12/28/2001   [id number] sliding hiatal hernia/. Three small bulbar ulcers, two with stigmata of bleeding and these were coagulated using heater probe unit   . ESOPHAGOGASTRODUODENOSCOPY N/A 03/21/2014   Dr. [name]: cervical esophageal web s/p dilation, negative H.pylori   . EYE SURGERY    . HIP FRACTURE SURGERY  2008  . JOINT REPLACEMENT     Rt hip, ????? sounds like just for fracture  . MALONEY DILATION N/A 03/21/2014   Procedure: [name] DILATION;  Surgeon: [name], MD;  Location: [facility];  Service: Endoscopy;  Laterality: N/A;  . SAVORY DILATION N/A 03/21/2014   Procedure: SAVORY DILATION;  Surgeon: [name], MD;  Location: [facility];  Service: Endoscopy;  Laterality: N/A;    Social History   Socioeconomic History  .  Marital status: Widowed    Spouse name: Not on file  . Number of children: 8  . Years of education: Not on file  . Highest education level: Not on file  Occupational History  . Not on file  Tobacco Use  . Smoking status: Never Smoker  . Smokeless tobacco: Never Used  Substance and Sexual Activity  . Alcohol use: No  . Drug use: No  . Sexual activity: Not on file  Other Topics Concern  . Not on file  Social History Narrative  . Not on file   Social Determinants of Health   Financial Resource Strain:   . Difficulty of Paying Living Expenses: Not on file  Food Insecurity:    . Worried About Charity fundraiser in the Last Year: Not on file  . Ran Out of Food in the Last Year: Not on file  Transportation Needs:   . Lack of Transportation (Medical): Not on file  . Lack of Transportation (Non-Medical): Not on file  Physical Activity:   . Days of Exercise per Week: Not on file  . Minutes of Exercise per Session: Not on file  Stress:   . Feeling of Stress : Not on file  Social Connections:   . Frequency of Communication with Friends and Family: Not on file  . Frequency of Social Gatherings with Friends and Family: Not on file  . Attends Religious Services: Not on file  . Active Member of Clubs or Organizations: Not on file  . Attends Archivist Meetings: Not on file  . Marital Status: Not on file  Intimate Partner Violence:   . Fear of Current or Ex-Partner: Not on file  . Emotionally Abused: Not on file  . Physically Abused: Not on file  . Sexually Abused: Not on file     Vitals:   06/23/19 1320  BP: (!) 188/85  Pulse: 60  Temp: (!) 97.4 F (36.3 C)  SpO2: 94%  Height: 5\' 5"  (1.651 m)    Wt Readings from Last 3 Encounters:  06/19/19 240 lb 3.2 oz (109 kg)  04/20/19 230 lb (104.3 kg)  12/22/18 238 lb (108 kg)     PHYSICAL EXAM General: NAD HEENT: Normal. Neck: No JVD, no thyromegaly. Lungs: Diminished breath sounds bilaterally CV: Regular rate and rhythm, normal S1/S2, no [id number], soft systolic murmur over RUSB.  Chronic, tense, bilateral leg edema.   Abdomen: Soft, nontender, no distention.  Neurologic: Alert and oriented.  Psych: Normal affect. Skin: Normal. Musculoskeletal: No gross deformities.      Labs: Lab Results  Component Value Date/Time   K 3.6 04/20/2019 09:36 PM   BUN 23 04/20/2019 09:36 PM   CREATININE 1.45 (H) 04/20/2019 09:36 PM   ALT 21 05/06/2018 05:33 PM   HGB 10.6 (L) 04/20/2019 09:36 PM     Lipids: No results found for: LDLCALC, LDLDIRECT, CHOL, TRIG, HDL     Echocardiogram 07/09/2017:  -  Left ventricle: The cavity size was normal. Wall thickness was  increased in a pattern of mild LVH. Systolic function was  vigorous. The estimated ejection fraction was in the range of 70%  to 75%. Wall motion was normal; there were no regional wall  motion abnormalities. Features are consistent with a pseudonormal  left ventricular filling pattern, with concomitant abnormal  relaxation and increased filling pressure (grade 2 diastolic  dysfunction).  - Aortic valve: Mildly to moderately calcified annulus. Trileaflet.  - Mitral valve: Mildly calcified annulus.  - Right atrium: Central  venous pressure (est): 8 mm Hg.  - Tricuspid valve: There was trivial regurgitation.  - Pulmonary arteries: PA peak pressure: 44 mm Hg (S).  - Pericardium, extracardiac: There was no pericardial effusion.    ASSESSMENT AND PLAN:  1. Chronic diastolic heart failure/dyspnea on exertion:Currently on torsemide 20 mg daily as prescribed by nephrology.  I will obtain a chest x-ray, CBC, basic metabolic panel, and BNP.  I will also aim to control blood pressure.  2. Hypertension: Blood pressureremains elevated.  Currently on hydralazine 50 mg twice daily.  I will increase to 75 mg twice daily.  3. Left bundle branch block: Chronic.  4. Chronic kidney disease stage [id number] by nephrology.  BUN 23, creatinine 1.45 on 04/20/2019.  I will check a basic metabolic panel today.  5.  Dyspnea on exertion: I will obtain a chest x-ray, BNP, basic metabolic panel, and CBC.  I will also arrange for an echocardiogram to assess for interval changes in cardiac structure and function. I will proceed with a nuclear myocardial perfusion imaging study to evaluate for ischemic heart disease (Lexiscan Myoview).    Disposition: Follow up 6 weeks  A high level of decision making was required for increased medical complexities.    Kate Sable, M.D., F.A.C.C.

## 2019-06-23 NOTE — Telephone Encounter (Signed)
Pre-cert Verification for the following procedure    ECHO -Scheduled for 07-09-2010 at [facility] scheduled for 2/17 & 2/18 at [place]

## 2019-06-23 NOTE — Patient Instructions (Signed)
Medication Instructions:  Your physician has recommended you make the following change in your medication:  Increase Hydralazine to 75 mg Two times Daily   *If you need a refill on your cardiac medications before your next appointment, please call your pharmacy*  Lab Work: Your physician recommends that you return for lab work in: Today  If you have labs (blood work) drawn today and your tests are completely normal, you will receive your results only by: Marland Kitchen MyChart Message (if you have MyChart) OR . A paper copy in the mail If you have any lab test that is abnormal or we need to change your treatment, we will call you to review the results.  Testing/Procedures: Your physician has requested that you have an echocardiogram. Echocardiography is a painless test that uses sound waves to create images of your heart. It provides your doctor with information about the size and shape of your heart and how well your heart's chambers and valves are working. This procedure takes approximately one hour. There are no restrictions for this procedure.  A chest x-ray takes a picture of the organs and structures inside the chest, including the heart, lungs, and blood vessels. This test can show several things, including, whether the heart is enlarges; whether fluid is building up in the lungs; and whether pacemaker / defibrillator leads are still in place.  Your physician has requested that you have a lexiscan myoview. For further information please visit [email or web address]. Please follow instruction sheet, as given.   Follow-Up: At [facility], you and your health needs are our priority.  As part of our continuing mission to provide you with exceptional heart care, we have created designated Provider Care Teams.  These Care Teams include your primary Cardiologist (physician) and Advanced Practice Providers (APPs -  Physician Assistants and Nurse Practitioners) who all work together to provide you with the  care you need, when you need it.  Your next appointment:   6 week(s)  The format for your next appointment:   In Person  Provider:   You may see Kate Sable, MD or one of the following Advanced Practice Providers on your designated Care Team:    Bernerd Pho, PA-C   Ermalinda Barrios, PA-C    Other Instructions Thank you for choosing [facility]!

## 2019-06-26 ENCOUNTER — Telehealth: Payer: Self-pay

## 2019-06-26 DIAGNOSIS — Z79899 Other long term (current) drug therapy: Secondary | ICD-10-CM

## 2019-06-26 MED ORDER — POTASSIUM CHLORIDE CRYS ER 20 MEQ PO TBCR: 20.0000 meq | EXTENDED_RELEASE_TABLET | Freq: Every day | ORAL | 3 refills | Status: DC

## 2019-06-26 NOTE — Telephone Encounter (Signed)
-----   Message from Herminio Commons, MD sent at 06/25/2019  9:15 AM EST ----- Increase torsemide to 20 mg bid x 3 days. Check BMET in 3 days. Add KCl 20 meq daily indefinitely.

## 2019-06-26 NOTE — Telephone Encounter (Signed)
I spoke with patient. She will increase her torsemide to twice a day for 3 days and then get BMET drawn.She will also start potassium 20 meq daily

## 2019-06-28 ENCOUNTER — Telehealth (HOSPITAL_COMMUNITY): Payer: Self-pay | Admitting: *Deleted

## 2019-06-28 DIAGNOSIS — K219 Gastro-esophageal reflux disease without esophagitis: Secondary | ICD-10-CM | POA: Diagnosis not present

## 2019-06-28 DIAGNOSIS — I5032 Chronic diastolic (congestive) heart failure: Secondary | ICD-10-CM | POA: Diagnosis not present

## 2019-06-28 NOTE — Telephone Encounter (Signed)
Patient given detailed instructions per Myocardial Perfusion Study Information Sheet for the test on 07/05/2019 at 1315. Patient notified to arrive 15 minutes early and that it is imperative to arrive on time for appointment to keep from having the test rescheduled.  If you need to cancel or reschedule your appointment, please call the office within 24 hours of your appointment. . Patient verbalized understanding.[name], Ranae Palms No mychart available

## 2019-06-29 ENCOUNTER — Ambulatory Visit (HOSPITAL_COMMUNITY)
Admission: RE | Admit: 2019-06-29 | Discharge: 2019-06-29 | Disposition: A | Discharge status: Home / Self Care | Payer: Medicare HMO | Source: Ambulatory Visit | Attending: Cardiovascular Disease | Admitting: Cardiovascular Disease

## 2019-06-29 ENCOUNTER — Other Ambulatory Visit: Payer: Self-pay

## 2019-06-29 DIAGNOSIS — R0602 Shortness of breath: Secondary | ICD-10-CM | POA: Diagnosis not present

## 2019-06-29 NOTE — Progress Notes (Signed)
*  PRELIMINARY RESULTS* Echocardiogram 2D Echocardiogram has been performed.  [name] 06/29/2019, 11:33 AM

## 2019-07-05 ENCOUNTER — Encounter (INDEPENDENT_AMBULATORY_CARE_PROVIDER_SITE_OTHER): Payer: Self-pay

## 2019-07-05 ENCOUNTER — Ambulatory Visit (HOSPITAL_COMMUNITY): Payer: Medicare HMO | Attending: Cardiovascular Disease

## 2019-07-05 ENCOUNTER — Other Ambulatory Visit: Payer: Self-pay

## 2019-07-05 VITALS — Ht 65.0 in | Wt 240.0 lb

## 2019-07-05 DIAGNOSIS — R0602 Shortness of breath: Secondary | ICD-10-CM | POA: Diagnosis not present

## 2019-07-05 MED ORDER — TECHNETIUM TC 99M TETROFOSMIN IV KIT
30.4000 | PACK | Freq: Once | INTRAVENOUS | Status: AC | PRN
  Administered 2019-07-05: 30.4 via INTRAVENOUS
  Filled 2019-07-05: qty 31

## 2019-07-05 MED ORDER — REGADENOSON 0.4 MG/5ML IV SOLN: 0.4000 mg | Freq: Once | INTRAVENOUS | Status: AC

## 2019-07-06 ENCOUNTER — Ambulatory Visit (HOSPITAL_COMMUNITY): Payer: Medicare HMO

## 2019-07-11 ENCOUNTER — Telehealth (HOSPITAL_COMMUNITY): Payer: Self-pay | Admitting: *Deleted

## 2019-07-11 NOTE — Telephone Encounter (Signed)
Patient given detailed instructions per Myocardial Perfusion Study Information Sheet for the test on 07/14/19 at 1:15. Patient notified to arrive 15 minutes early and that it is imperative to arrive on time for appointment to keep from having the test rescheduled.  If you need to cancel or reschedule your appointment, please call the office within 24 hours of your appointment. . Patient verbalized understanding.[name]

## 2019-07-14 ENCOUNTER — Ambulatory Visit (HOSPITAL_COMMUNITY): Payer: Medicare HMO

## 2019-07-18 ENCOUNTER — Other Ambulatory Visit: Payer: Self-pay

## 2019-07-18 ENCOUNTER — Encounter (INDEPENDENT_AMBULATORY_CARE_PROVIDER_SITE_OTHER): Payer: Self-pay

## 2019-07-18 ENCOUNTER — Ambulatory Visit (HOSPITAL_COMMUNITY): Payer: Medicare HMO | Attending: Cardiology

## 2019-07-18 DIAGNOSIS — R0602 Shortness of breath: Secondary | ICD-10-CM | POA: Insufficient documentation

## 2019-07-18 LAB — MYOCARDIAL PERFUSION IMAGING
LV dias vol: 106 mL (ref 46–106)
LV sys vol: 48 mL
Peak HR: 95 {beats}/min
RATE: 0.36
Rest HR: 85 {beats}/min
SDS: 14
SRS: 12
SSS: 24
TID: 1

## 2019-07-18 MED ORDER — REGADENOSON 0.4 MG/5ML IV SOLN
0.4000 mg | Freq: Once | INTRAVENOUS | Status: AC
  Administered 2019-07-18: 0.4 mg via INTRAVENOUS

## 2019-07-18 MED ORDER — TECHNETIUM TC 99M TETROFOSMIN IV KIT
33.0000 | PACK | Freq: Once | INTRAVENOUS | Status: AC | PRN
  Administered 2019-07-18: 33 via INTRAVENOUS
  Filled 2019-07-18: qty 33

## 2019-07-23 ENCOUNTER — Emergency Department (HOSPITAL_COMMUNITY): Payer: Medicare HMO

## 2019-07-23 ENCOUNTER — Other Ambulatory Visit: Payer: Self-pay

## 2019-07-23 ENCOUNTER — Inpatient Hospital Stay (HOSPITAL_COMMUNITY)
Admission: EM | Admit: 2019-07-23 | Discharge: 2019-07-31 | DRG: 438 | Disposition: A | Discharge status: Skilled Nursing Facility | Payer: Medicare HMO | Attending: Family Medicine | Admitting: Family Medicine

## 2019-07-23 ENCOUNTER — Encounter (HOSPITAL_COMMUNITY): Payer: Self-pay | Admitting: Emergency Medicine

## 2019-07-23 DIAGNOSIS — K219 Gastro-esophageal reflux disease without esophagitis: Secondary | ICD-10-CM | POA: Diagnosis present

## 2019-07-23 DIAGNOSIS — E119 Type 2 diabetes mellitus without complications: Secondary | ICD-10-CM | POA: Diagnosis present

## 2019-07-23 DIAGNOSIS — IMO0002 Reserved for concepts with insufficient information to code with codable children: Secondary | ICD-10-CM

## 2019-07-23 DIAGNOSIS — E118 Type 2 diabetes mellitus with unspecified complications: Secondary | ICD-10-CM

## 2019-07-23 DIAGNOSIS — Z8673 Personal history of transient ischemic attack (TIA), and cerebral infarction without residual deficits: Secondary | ICD-10-CM | POA: Diagnosis not present

## 2019-07-23 DIAGNOSIS — Z961 Presence of intraocular lens: Secondary | ICD-10-CM | POA: Diagnosis present

## 2019-07-23 DIAGNOSIS — Z794 Long term (current) use of insulin: Secondary | ICD-10-CM | POA: Diagnosis not present

## 2019-07-23 DIAGNOSIS — N179 Acute kidney failure, unspecified: Secondary | ICD-10-CM | POA: Diagnosis present

## 2019-07-23 DIAGNOSIS — E1122 Type 2 diabetes mellitus with diabetic chronic kidney disease: Secondary | ICD-10-CM | POA: Diagnosis present

## 2019-07-23 DIAGNOSIS — Z833 Family history of diabetes mellitus: Secondary | ICD-10-CM

## 2019-07-23 DIAGNOSIS — I4819 Other persistent atrial fibrillation: Secondary | ICD-10-CM | POA: Diagnosis not present

## 2019-07-23 DIAGNOSIS — K429 Umbilical hernia without obstruction or gangrene: Secondary | ICD-10-CM | POA: Diagnosis not present

## 2019-07-23 DIAGNOSIS — R079 Chest pain, unspecified: Secondary | ICD-10-CM | POA: Diagnosis not present

## 2019-07-23 DIAGNOSIS — G4733 Obstructive sleep apnea (adult) (pediatric): Secondary | ICD-10-CM | POA: Diagnosis present

## 2019-07-23 DIAGNOSIS — N390 Urinary tract infection, site not specified: Secondary | ICD-10-CM | POA: Diagnosis not present

## 2019-07-23 DIAGNOSIS — J810 Acute pulmonary edema: Secondary | ICD-10-CM

## 2019-07-23 DIAGNOSIS — I10 Essential (primary) hypertension: Secondary | ICD-10-CM | POA: Diagnosis present

## 2019-07-23 DIAGNOSIS — E1121 Type 2 diabetes mellitus with diabetic nephropathy: Secondary | ICD-10-CM | POA: Diagnosis present

## 2019-07-23 DIAGNOSIS — Z9049 Acquired absence of other specified parts of digestive tract: Secondary | ICD-10-CM | POA: Diagnosis not present

## 2019-07-23 DIAGNOSIS — R001 Bradycardia, unspecified: Secondary | ICD-10-CM | POA: Diagnosis present

## 2019-07-23 DIAGNOSIS — R1084 Generalized abdominal pain: Secondary | ICD-10-CM | POA: Diagnosis not present

## 2019-07-23 DIAGNOSIS — I5032 Chronic diastolic (congestive) heart failure: Secondary | ICD-10-CM | POA: Diagnosis present

## 2019-07-23 DIAGNOSIS — E1140 Type 2 diabetes mellitus with diabetic neuropathy, unspecified: Secondary | ICD-10-CM | POA: Diagnosis not present

## 2019-07-23 DIAGNOSIS — J9601 Acute respiratory failure with hypoxia: Secondary | ICD-10-CM

## 2019-07-23 DIAGNOSIS — E042 Nontoxic multinodular goiter: Secondary | ICD-10-CM | POA: Diagnosis present

## 2019-07-23 DIAGNOSIS — I130 Hypertensive heart and chronic kidney disease with heart failure and stage 1 through stage 4 chronic kidney disease, or unspecified chronic kidney disease: Secondary | ICD-10-CM | POA: Diagnosis present

## 2019-07-23 DIAGNOSIS — J9602 Acute respiratory failure with hypercapnia: Secondary | ICD-10-CM | POA: Diagnosis present

## 2019-07-23 DIAGNOSIS — D631 Anemia in chronic kidney disease: Secondary | ICD-10-CM | POA: Diagnosis present

## 2019-07-23 DIAGNOSIS — I4891 Unspecified atrial fibrillation: Secondary | ICD-10-CM | POA: Diagnosis present

## 2019-07-23 DIAGNOSIS — Z79899 Other long term (current) drug therapy: Secondary | ICD-10-CM | POA: Diagnosis not present

## 2019-07-23 DIAGNOSIS — I482 Chronic atrial fibrillation, unspecified: Secondary | ICD-10-CM | POA: Diagnosis present

## 2019-07-23 DIAGNOSIS — E1165 Type 2 diabetes mellitus with hyperglycemia: Secondary | ICD-10-CM

## 2019-07-23 DIAGNOSIS — N183 Chronic kidney disease, stage 3 unspecified: Secondary | ICD-10-CM | POA: Diagnosis present

## 2019-07-23 DIAGNOSIS — G4710 Hypersomnia, unspecified: Secondary | ICD-10-CM | POA: Diagnosis present

## 2019-07-23 DIAGNOSIS — Z20822 Contact with and (suspected) exposure to covid-19: Secondary | ICD-10-CM | POA: Diagnosis present

## 2019-07-23 DIAGNOSIS — K859 Acute pancreatitis without necrosis or infection, unspecified: Secondary | ICD-10-CM | POA: Diagnosis not present

## 2019-07-23 DIAGNOSIS — K8500 Idiopathic acute pancreatitis without necrosis or infection: Secondary | ICD-10-CM | POA: Diagnosis not present

## 2019-07-23 DIAGNOSIS — R0689 Other abnormalities of breathing: Secondary | ICD-10-CM | POA: Diagnosis not present

## 2019-07-23 DIAGNOSIS — R0902 Hypoxemia: Secondary | ICD-10-CM | POA: Diagnosis not present

## 2019-07-23 DIAGNOSIS — I447 Left bundle-branch block, unspecified: Secondary | ICD-10-CM | POA: Diagnosis not present

## 2019-07-23 DIAGNOSIS — B9620 Unspecified Escherichia coli [E. coli] as the cause of diseases classified elsewhere: Secondary | ICD-10-CM | POA: Diagnosis present

## 2019-07-23 DIAGNOSIS — Z9842 Cataract extraction status, left eye: Secondary | ICD-10-CM | POA: Diagnosis not present

## 2019-07-23 DIAGNOSIS — I5082 Biventricular heart failure: Secondary | ICD-10-CM | POA: Diagnosis present

## 2019-07-23 DIAGNOSIS — R0602 Shortness of breath: Secondary | ICD-10-CM

## 2019-07-23 DIAGNOSIS — R062 Wheezing: Secondary | ICD-10-CM | POA: Diagnosis not present

## 2019-07-23 DIAGNOSIS — R0600 Dyspnea, unspecified: Secondary | ICD-10-CM

## 2019-07-23 DIAGNOSIS — I5033 Acute on chronic diastolic (congestive) heart failure: Secondary | ICD-10-CM | POA: Diagnosis present

## 2019-07-23 DIAGNOSIS — Z6841 Body Mass Index (BMI) 40.0 and over, adult: Secondary | ICD-10-CM | POA: Diagnosis not present

## 2019-07-23 DIAGNOSIS — Z7401 Bed confinement status: Secondary | ICD-10-CM | POA: Diagnosis not present

## 2019-07-23 DIAGNOSIS — M6281 Muscle weakness (generalized): Secondary | ICD-10-CM | POA: Diagnosis not present

## 2019-07-23 DIAGNOSIS — N1832 Chronic kidney disease, stage 3b: Secondary | ICD-10-CM | POA: Diagnosis present

## 2019-07-23 DIAGNOSIS — R69 Illness, unspecified: Secondary | ICD-10-CM | POA: Diagnosis not present

## 2019-07-23 DIAGNOSIS — J9600 Acute respiratory failure, unspecified whether with hypoxia or hypercapnia: Secondary | ICD-10-CM | POA: Diagnosis not present

## 2019-07-23 HISTORY — DX: Chronic kidney disease, stage 3 unspecified: N18.30

## 2019-07-23 HISTORY — DX: Type 2 diabetes mellitus without complications: E11.9

## 2019-07-23 HISTORY — DX: Personal history of transient ischemic attack (TIA), and cerebral infarction without residual deficits: Z86.73

## 2019-07-23 HISTORY — DX: Essential (primary) hypertension: I10

## 2019-07-23 LAB — CBC WITH DIFFERENTIAL/PLATELET
Abs Immature Granulocytes: 0.03 10*3/uL (ref 0.00–0.07)
Basophils Absolute: 0 10*3/uL (ref 0.0–0.1)
Basophils Relative: 0 %
Eosinophils Absolute: 0.1 10*3/uL (ref 0.0–0.5)
Eosinophils Relative: 1 %
HCT: 36.4 % (ref 36.0–46.0)
Hemoglobin: 11.1 g/dL — ABNORMAL LOW (ref 12.0–15.0)
Immature Granulocytes: 0 %
Lymphocytes Relative: 12 %
Lymphs Abs: 1.4 10*3/uL (ref 0.7–4.0)
MCH: 28 pg (ref 26.0–34.0)
MCHC: 30.5 g/dL (ref 30.0–36.0)
MCV: 91.7 fL (ref 80.0–100.0)
Monocytes Absolute: 0.7 10*3/uL (ref 0.1–1.0)
Monocytes Relative: 6 %
Neutro Abs: 9.7 10*3/uL — ABNORMAL HIGH (ref 1.7–7.7)
Neutrophils Relative %: 81 %
Platelets: 247 10*3/uL (ref 150–400)
RBC: 3.97 MIL/uL (ref 3.87–5.11)
RDW: 16.5 % — ABNORMAL HIGH (ref 11.5–15.5)
WBC: 11.9 10*3/uL — ABNORMAL HIGH (ref 4.0–10.5)
nRBC: 0 % (ref 0.0–0.2)

## 2019-07-23 LAB — URINALYSIS, ROUTINE W REFLEX MICROSCOPIC
Bilirubin Urine: NEGATIVE
Glucose, UA: NEGATIVE mg/dL
Hgb urine dipstick: NEGATIVE
Ketones, ur: NEGATIVE mg/dL
Nitrite: NEGATIVE
Protein, ur: 30 mg/dL — AB
Specific Gravity, Urine: 1.009 (ref 1.005–1.030)
WBC, UA: >50 WBC/hpf — ABNORMAL HIGH (ref 0–5)
pH: 6 (ref 5.0–8.0)

## 2019-07-23 LAB — POC SARS CORONAVIRUS 2 AG -  ED: SARS Coronavirus 2 Ag: NEGATIVE

## 2019-07-23 LAB — COMPREHENSIVE METABOLIC PANEL
ALT: 16 U/L (ref 0–44)
AST: 17 U/L (ref 15–41)
Albumin: 3.6 g/dL (ref 3.5–5.0)
Alkaline Phosphatase: 55 U/L (ref 38–126)
Anion gap: 8 (ref 5–15)
BUN: 32 mg/dL — ABNORMAL HIGH (ref 8–23)
CO2: 27 mmol/L (ref 22–32)
Calcium: 8.7 mg/dL — ABNORMAL LOW (ref 8.9–10.3)
Chloride: 106 mmol/L (ref 98–111)
Creatinine, Ser: 1.54 mg/dL — ABNORMAL HIGH (ref 0.44–1.00)
GFR calc Af Amer: 36 mL/min — ABNORMAL LOW (ref >60)
GFR calc non Af Amer: 31 mL/min — ABNORMAL LOW (ref >60)
Glucose, Bld: 203 mg/dL — ABNORMAL HIGH (ref 70–99)
Potassium: 4.1 mmol/L (ref 3.5–5.1)
Sodium: 141 mmol/L (ref 135–145)
Total Bilirubin: 0.6 mg/dL (ref 0.3–1.2)
Total Protein: 8 g/dL (ref 6.5–8.1)

## 2019-07-23 LAB — LIPASE, BLOOD: Lipase: 2264 U/L — ABNORMAL HIGH (ref 11–51)

## 2019-07-23 LAB — GLUCOSE, CAPILLARY: Glucose-Capillary: 207 mg/dL — ABNORMAL HIGH (ref 70–99)

## 2019-07-23 LAB — TSH: TSH: 1.264 u[IU]/mL (ref 0.350–4.500)

## 2019-07-23 LAB — TROPONIN I (HIGH SENSITIVITY)
Troponin I (High Sensitivity): 14 ng/L (ref <18)
Troponin I (High Sensitivity): 14 ng/L (ref <18)

## 2019-07-23 LAB — BRAIN NATRIURETIC PEPTIDE: B Natriuretic Peptide: 104 pg/mL — ABNORMAL HIGH (ref 0.0–100.0)

## 2019-07-23 LAB — MAGNESIUM: Magnesium: 2.2 mg/dL (ref 1.7–2.4)

## 2019-07-23 MED ORDER — LEVALBUTEROL TARTRATE 45 MCG/ACT IN AERO
2.0000 | INHALATION_SPRAY | Freq: Four times a day (QID) | RESPIRATORY_TRACT | Status: DC
  Filled 2019-07-23: qty 15

## 2019-07-23 MED ORDER — HEPARIN SODIUM (PORCINE) 5000 UNIT/ML IJ SOLN: 5000.0000 [IU] | Freq: Three times a day (TID) | INTRAMUSCULAR | Status: DC

## 2019-07-23 MED ORDER — INSULIN DETEMIR 100 UNIT/ML ~~LOC~~ SOLN
35.0000 [IU] | Freq: Every day | SUBCUTANEOUS | Status: DC
  Administered 2019-07-23: 35 [IU] via SUBCUTANEOUS
  Filled 2019-07-23 (×2): qty 0.35

## 2019-07-23 MED ORDER — FUROSEMIDE 10 MG/ML IJ SOLN
40.0000 mg | Freq: Once | INTRAMUSCULAR | Status: AC
  Administered 2019-07-23: 40 mg via INTRAVENOUS
  Filled 2019-07-23: qty 4

## 2019-07-23 MED ORDER — INSULIN ASPART 100 UNIT/ML ~~LOC~~ SOLN
0.0000 [IU] | Freq: Three times a day (TID) | SUBCUTANEOUS | Status: DC
  Administered 2019-07-24: 4 [IU] via SUBCUTANEOUS
  Administered 2019-07-24 (×2): 5 [IU] via SUBCUTANEOUS
  Administered 2019-07-25: 4 [IU] via SUBCUTANEOUS
  Administered 2019-07-25: 3 [IU] via SUBCUTANEOUS
  Administered 2019-07-25: 2 [IU] via SUBCUTANEOUS
  Administered 2019-07-26 – 2019-07-29 (×6): 1 [IU] via SUBCUTANEOUS
  Administered 2019-07-29 (×2): 2 [IU] via SUBCUTANEOUS
  Administered 2019-07-30 (×2): 4 [IU] via SUBCUTANEOUS
  Administered 2019-07-30: 3 [IU] via SUBCUTANEOUS
  Administered 2019-07-31: 2 [IU] via SUBCUTANEOUS
  Administered 2019-07-31: 1 [IU] via SUBCUTANEOUS

## 2019-07-23 MED ORDER — SODIUM CHLORIDE 0.9 % IV SOLN
1.0000 g | INTRAVENOUS | Status: DC
  Administered 2019-07-23 – 2019-07-25 (×3): 1 g via INTRAVENOUS
  Filled 2019-07-23 (×3): qty 10

## 2019-07-23 MED ORDER — HYDRALAZINE HCL 25 MG PO TABS
75.0000 mg | ORAL_TABLET | Freq: Two times a day (BID) | ORAL | Status: DC
  Administered 2019-07-23: 75 mg via ORAL
  Filled 2019-07-23: qty 3

## 2019-07-23 MED ORDER — ALBUTEROL SULFATE (2.5 MG/3ML) 0.083% IN NEBU: 2.5000 mg | INHALATION_SOLUTION | RESPIRATORY_TRACT | Status: DC | PRN

## 2019-07-23 MED ORDER — NITROGLYCERIN 0.4 MG SL SUBL
0.4000 mg | SUBLINGUAL_TABLET | Freq: Once | SUBLINGUAL | Status: AC
  Administered 2019-07-23: 0.4 mg via SUBLINGUAL
  Filled 2019-07-23: qty 1

## 2019-07-23 MED ORDER — SODIUM CHLORIDE 0.9 % IV SOLN: 250.0000 mL | INTRAVENOUS | Status: DC | PRN

## 2019-07-23 MED ORDER — APIXABAN 2.5 MG PO TABS
2.5000 mg | ORAL_TABLET | Freq: Two times a day (BID) | ORAL | Status: DC
  Administered 2019-07-23 – 2019-07-31 (×16): 2.5 mg via ORAL
  Filled 2019-07-23 (×18): qty 1

## 2019-07-23 MED ORDER — SODIUM CHLORIDE 0.9% FLUSH
3.0000 mL | Freq: Two times a day (BID) | INTRAVENOUS | Status: DC
  Administered 2019-07-23 – 2019-07-31 (×16): 3 mL via INTRAVENOUS

## 2019-07-23 MED ORDER — IOHEXOL 350 MG/ML SOLN
80.0000 mL | Freq: Once | INTRAVENOUS | Status: AC | PRN
  Administered 2019-07-23: 80 mL via INTRAVENOUS

## 2019-07-23 MED ORDER — ACETAMINOPHEN 325 MG PO TABS
650.0000 mg | ORAL_TABLET | Freq: Four times a day (QID) | ORAL | Status: DC | PRN
  Administered 2019-07-24 – 2019-07-27 (×5): 650 mg via ORAL
  Filled 2019-07-23 (×5): qty 2

## 2019-07-23 MED ORDER — INSULIN ASPART 100 UNIT/ML ~~LOC~~ SOLN
0.0000 [IU] | Freq: Every day | SUBCUTANEOUS | Status: DC
  Administered 2019-07-23: 2 [IU] via SUBCUTANEOUS
  Administered 2019-07-24: 4 [IU] via SUBCUTANEOUS
  Administered 2019-07-26 – 2019-07-28 (×2): 2 [IU] via SUBCUTANEOUS
  Administered 2019-07-29 – 2019-07-30 (×2): 4 [IU] via SUBCUTANEOUS

## 2019-07-23 MED ORDER — FENTANYL CITRATE (PF) 100 MCG/2ML IJ SOLN
25.0000 ug | INTRAMUSCULAR | Status: DC | PRN
  Administered 2019-07-23 (×2): 25 ug via INTRAVENOUS
  Filled 2019-07-23 (×2): qty 2

## 2019-07-23 MED ORDER — TRAZODONE HCL 50 MG PO TABS
50.0000 mg | ORAL_TABLET | Freq: Every evening | ORAL | Status: DC | PRN
  Administered 2019-07-23 – 2019-07-28 (×5): 50 mg via ORAL
  Filled 2019-07-23 (×5): qty 1

## 2019-07-23 MED ORDER — POLYETHYLENE GLYCOL 3350 17 G PO PACK: 17.0000 g | PACK | Freq: Every day | ORAL | Status: DC | PRN

## 2019-07-23 MED ORDER — DILTIAZEM HCL 25 MG/5ML IV SOLN
10.0000 mg | Freq: Once | INTRAVENOUS | Status: AC
  Administered 2019-07-23: 10 mg via INTRAVENOUS
  Filled 2019-07-23: qty 5

## 2019-07-23 MED ORDER — SODIUM CHLORIDE 0.9% FLUSH: 3.0000 mL | INTRAVENOUS | Status: DC | PRN

## 2019-07-23 MED ORDER — LABETALOL HCL 5 MG/ML IV SOLN
10.0000 mg | INTRAVENOUS | Status: DC | PRN
  Administered 2019-07-27 – 2019-07-28 (×3): 10 mg via INTRAVENOUS
  Filled 2019-07-23 (×3): qty 4

## 2019-07-23 MED ORDER — ACETAMINOPHEN 650 MG RE SUPP: 650.0000 mg | Freq: Four times a day (QID) | RECTAL | Status: DC | PRN

## 2019-07-23 MED ORDER — MORPHINE SULFATE (PF) 4 MG/ML IV SOLN
4.0000 mg | Freq: Once | INTRAVENOUS | Status: AC
  Administered 2019-07-23: 4 mg via INTRAVENOUS
  Filled 2019-07-23: qty 1

## 2019-07-23 MED ORDER — ONDANSETRON HCL 4 MG/2ML IJ SOLN
4.0000 mg | Freq: Four times a day (QID) | INTRAMUSCULAR | Status: DC | PRN
  Administered 2019-07-24: 4 mg via INTRAVENOUS
  Filled 2019-07-23: qty 2

## 2019-07-23 MED ORDER — IPRATROPIUM BROMIDE HFA 17 MCG/ACT IN AERS: 2.0000 | INHALATION_SPRAY | Freq: Once | RESPIRATORY_TRACT | Status: DC

## 2019-07-23 MED ORDER — DILTIAZEM HCL 30 MG PO TABS
30.0000 mg | ORAL_TABLET | Freq: Four times a day (QID) | ORAL | Status: DC
  Administered 2019-07-23: 30 mg via ORAL
  Filled 2019-07-23: qty 1

## 2019-07-23 MED ORDER — METHYLPREDNISOLONE SODIUM SUCC 125 MG IJ SOLR
60.0000 mg | Freq: Once | INTRAMUSCULAR | Status: AC
  Administered 2019-07-23: 60 mg via INTRAVENOUS
  Filled 2019-07-23: qty 2

## 2019-07-23 MED ORDER — PANTOPRAZOLE SODIUM 40 MG IV SOLR
40.0000 mg | INTRAVENOUS | Status: DC
  Administered 2019-07-23 – 2019-07-28 (×6): 40 mg via INTRAVENOUS
  Filled 2019-07-23 (×6): qty 40

## 2019-07-23 MED ORDER — SIMVASTATIN 20 MG PO TABS: 20.0000 mg | ORAL_TABLET | Freq: Every day | ORAL | Status: DC

## 2019-07-23 MED ORDER — SODIUM CHLORIDE 0.9 % IV SOLN: INTRAVENOUS | Status: DC

## 2019-07-23 MED ORDER — ONDANSETRON HCL 4 MG PO TABS: 4.0000 mg | ORAL_TABLET | Freq: Four times a day (QID) | ORAL | Status: DC | PRN

## 2019-07-23 MED ORDER — LEVALBUTEROL HCL 0.63 MG/3ML IN NEBU
0.6300 mg | INHALATION_SOLUTION | Freq: Four times a day (QID) | RESPIRATORY_TRACT | Status: DC | PRN
  Administered 2019-07-27: 0.63 mg via RESPIRATORY_TRACT
  Filled 2019-07-23: qty 3

## 2019-07-23 MED ORDER — ALBUTEROL SULFATE HFA 108 (90 BASE) MCG/ACT IN AERS
INHALATION_SPRAY | RESPIRATORY_TRACT | Status: AC
  Administered 2019-07-23: 2
  Filled 2019-07-23: qty 6.7

## 2019-07-23 MED ORDER — ONDANSETRON HCL 4 MG/2ML IJ SOLN
4.0000 mg | Freq: Once | INTRAMUSCULAR | Status: AC
  Administered 2019-07-23: 4 mg via INTRAVENOUS
  Filled 2019-07-23: qty 2

## 2019-07-23 MED ORDER — GABAPENTIN 300 MG PO CAPS
300.0000 mg | ORAL_CAPSULE | Freq: Two times a day (BID) | ORAL | Status: DC
  Administered 2019-07-23 – 2019-07-31 (×16): 300 mg via ORAL
  Filled 2019-07-23 (×17): qty 1

## 2019-07-23 MED ORDER — ALBUTEROL SULFATE HFA 108 (90 BASE) MCG/ACT IN AERS: 4.0000 | INHALATION_SPRAY | Freq: Once | RESPIRATORY_TRACT | Status: DC

## 2019-07-23 NOTE — ED Triage Notes (Signed)
Pt brought in from home for abd pain starting at 0300 with emesis x1. Audible wheezing noted but pt denies sob.

## 2019-07-23 NOTE — ED Provider Notes (Signed)
[facility] EMERGENCY DEPARTMENT Provider Note   CSN: [id number] Arrival date & time: 07/23/19  1305     History Chief Complaint  Patient presents with  . Abdominal Pain    [name] is a 85 y.o. female.  HPI      [name] is a 85 y.o. female, with a history of chronic diastolic heart failure, CKD, CVA, DM, GERD, HTN, presenting to the ED with abdominal pain and chest pain beginning around 3 AM this morning. Pain is described as a pressure, 8/10, nonradiating from the upper abdomen and lower chest.  Her pain does not change with exertion, eating, or position. She endorses lower extremity edema at baseline, however, lower extremity edema may be worse than normal. She had one episode of nonbloody, nonbilious emesis this morning. Last bowel movement was several days ago, which she states is not unusual for her. She voices compliance with her medications, including her torsemide. Denies fever/chills, shortness of breath, acute cough, orthopnea, urinary symptoms, dizziness, syncope, numbness, weakness, or any other complaints.  Past Medical History:  Diagnosis Date  . Arthritis   . Chronic diastolic heart failure ([place])   . CKD (chronic kidney disease)   . CVA (cerebral infarction)   . Diabetes mellitus without complication ([place])   . GERD (gastroesophageal reflux disease)   . Hemorrhoids   . Hypertension   . Stroke [facility])     Patient Active Problem List   Diagnosis Date Noted  . Pancreatitis, acute 07/23/2019  . Shortness of breath 06/19/2019  . Acute diastolic CHF (congestive heart failure) ([place]) 07/08/2017  . Acute respiratory failure with hypoxia ([place]) 07/08/2017  . Uncontrolled type 2 diabetes mellitus with diabetic nephropathy, with long-term current use of insulin ([place]) 07/08/2017  . CKD (chronic kidney disease), stage III 07/08/2017  . Essential hypertension 07/08/2017  . Hypertension 06/23/2017  . Prolapsed internal hemorrhoids, grade 3 06/25/2016  .  Abdominal pain 05/08/2015  . Dysphagia, pharyngoesophageal phase   . Other hemorrhoids   . Diverticulosis of colon without hemorrhage   . GERD (gastroesophageal reflux disease) 02/28/2014  . Constipation 02/28/2014  . Esophageal dysphagia 02/28/2014  . Rectal bleeding 02/28/2014  . OSTEOARTHRITIS, KNEE, RIGHT, SEVERE 12/13/2008  . DEGENERATIVE DISC DISEASE, LUMBOSACRAL SPINE W/RADICULOPATHY 12/13/2008  . BACK PAIN 12/13/2008  . DIABETES 04/13/2007  . FRACTURE, FEMUR, INTERTROCHANTERIC REGION 04/13/2007    Past Surgical History:  Procedure Laterality Date  . BACK SURGERY    . CATARACT EXTRACTION W/PHACO Left 02/28/2013   Procedure: CATARACT EXTRACTION PHACO AND INTRAOCULAR LENS PLACEMENT (IOL) CDE=15.60;  Surgeon: Elta Guadeloupe T. Gershon Crane, MD;  Location: [facility] ORS;  Service: Ophthalmology;  Laterality: Left;  . CHOLECYSTECTOMY    . COLONOSCOPY  12/12/2003   RMR: Normal rectum.  Long capacious tortuous colon, colonic mucosa appeared normal  . COLONOSCOPY N/A 03/21/2014   Dr. [name]: internal and external hemorrhoids, torturous colon, colonic diverticulosis   . ESOPHAGOGASTRODUODENOSCOPY  12/28/2001   [id number] sliding hiatal hernia/. Three small bulbar ulcers, two with stigmata of bleeding and these were coagulated using heater probe unit   . ESOPHAGOGASTRODUODENOSCOPY N/A 03/21/2014   Dr. [name]: cervical esophageal web s/p dilation, negative H.pylori   . EYE SURGERY    . HIP FRACTURE SURGERY  2008  . JOINT REPLACEMENT     Rt hip, ????? sounds like just for fracture  . MALONEY DILATION N/A 03/21/2014   Procedure: [name] DILATION;  Surgeon: [name], MD;  Location: [facility] ENDO SUITE;  Service: Endoscopy;  Laterality:  N/A;  . Azzie Almas DILATION N/A 03/21/2014   Procedure: SAVORY DILATION;  Surgeon: [name], MD;  Location: [facility] ENDO SUITE;  Service: Endoscopy;  Laterality: N/A;     OB History   No obstetric history on file.     Family History  Problem Relation Age of Onset  . Crohn's  disease Daughter   . Hypertension Mother   . Hypertension Sister   . Diabetes Brother   . Diabetes Sister   . Diabetes Sister   . Heart attack Sister   . Colon cancer Neg Hx     Social History   Tobacco Use  . Smoking status: Never Smoker  . Smokeless tobacco: Never Used  Substance Use Topics  . Alcohol use: No  . Drug use: No    Home Medications Prior to Admission medications   Medication Sig Start Date End Date Taking? Authorizing Provider  fluticasone (FLONASE) 50 MCG/ACT nasal spray Place 1 spray into both nostrils daily. 07/15/17  Yes Mikhail, Maryann, DO  gabapentin (NEURONTIN) 300 MG capsule Take 300 mg by mouth 2 (two) times daily.  02/22/14  Yes [provider]  hydrALAZINE (APRESOLINE) 50 MG tablet Take 1.5 tablets (75 mg total) by mouth 2 (two) times daily. 06/23/19 09/21/19 Yes Herminio Commons, MD  hydrocortisone (ANUSOL-HC) 2.5 % rectal cream Place 1 application rectally 2 (two) times daily. 06/19/19  Yes [name] S, PA-C  insulin aspart (NOVOLOG) 100 UNIT/ML injection Inject 30 Units into the skin 3 (three) times daily before meals.    Yes [provider]  insulin detemir (LEVEMIR) 100 UNIT/ML injection Inject 80 Units into the skin at bedtime.    Yes [provider]  pantoprazole (PROTONIX) 40 MG tablet TAKE 1 TABLET BY MOUTH ONCE DAILY. Patient taking differently: Take 40 mg by mouth daily.  07/06/16  Yes Annitta Needs, NP  potassium chloride SA (KLOR-CON) 20 MEQ tablet Take 1 tablet (20 mEq total) by mouth daily. 06/26/19  Yes Herminio Commons, MD  simvastatin (ZOCOR) 20 MG tablet Take 20 mg by mouth daily.   Yes [provider]  torsemide (DEMADEX) 20 MG tablet Take 20 mg by mouth daily. 06/15/19  Yes [provider]    Allergies    Patient has no known allergies.  Review of Systems   Review of Systems  Constitutional: Negative for chills, diaphoresis and fever.  Respiratory: Negative for cough and shortness  of breath.   Cardiovascular: Positive for chest pain.  Gastrointestinal: Positive for abdominal pain, constipation, nausea and vomiting. Negative for blood in stool and diarrhea.  Genitourinary: Negative for dysuria, flank pain, frequency and hematuria.  Musculoskeletal: Negative for back pain.  Neurological: Negative for dizziness, syncope, weakness and numbness.  All other systems reviewed and are negative.   Physical Exam Updated Vital Signs BP (!) 170/118 (BP Location: Right Arm)   Pulse 87   Temp 98.1 F (36.7 C) (Oral)   Resp 18   Ht 5\' 4"  (1.626 m)   Wt 107 kg   SpO2 96%   BMI 40.51 kg/m   Physical Exam Vitals and nursing note reviewed.  Constitutional:      General: She is in acute distress.     Appearance: She is well-developed. She is obese. She is not diaphoretic.  HENT:     Head: Normocephalic and atraumatic.     Mouth/Throat:     Mouth: Mucous membranes are moist.     Pharynx: Oropharynx is clear.  Eyes:  Conjunctiva/sclera: Conjunctivae normal.  Cardiovascular:     Rate and Rhythm: Normal rate and regular rhythm.     Pulses: Normal pulses.          Radial pulses are 2+ on the right side and 2+ on the left side.       Posterior tibial pulses are 2+ on the right side and 2+ on the left side.     Heart sounds: Normal heart sounds.     Comments: Tactile temperature in the extremities appropriate and equal bilaterally. Pulmonary:     Effort: Tachypnea and respiratory distress present.     Breath sounds: Decreased breath sounds and wheezing present.     Comments: Despite patient denying any shortness of breath, she is tachypneic with increased work of breathing.  Speaks in short phrases. Abdominal:     Palpations: Abdomen is soft.     Tenderness: There is abdominal tenderness. There is no guarding.    Musculoskeletal:     Cervical back: Neck supple.     Right lower leg: Pitting Edema present.     Left lower leg: Pitting Edema present.     Comments:  Edema worse on the right, which patient states is not unusual for her.  No tenderness, increased warmth, color change.  Lymphadenopathy:     Cervical: No cervical adenopathy.  Skin:    General: Skin is warm and dry.  Neurological:     Mental Status: She is alert.  Psychiatric:        Mood and Affect: Mood and affect normal.        Speech: Speech normal.        Behavior: Behavior normal.     ED Results / Procedures / Treatments   Labs (all labs ordered are listed, but only abnormal results are displayed) Labs Reviewed  URINALYSIS, ROUTINE W REFLEX MICROSCOPIC - Abnormal; Notable for the following components:      Result Value   APPearance HAZY (*)    Protein, ur 30 (*)    Leukocytes,Ua MODERATE (*)    WBC, UA >50 (*)    Bacteria, UA MANY (*)    All other components within normal limits  COMPREHENSIVE METABOLIC PANEL - Abnormal; Notable for the following components:   Glucose, Bld 203 (*)    BUN 32 (*)    Creatinine, Ser 1.54 (*)    Calcium 8.7 (*)    GFR calc non Af Amer 31 (*)    GFR calc Af Amer 36 (*)    All other components within normal limits  BRAIN NATRIURETIC PEPTIDE - Abnormal; Notable for the following components:   B Natriuretic Peptide 104.0 (*)    All other components within normal limits  CBC WITH DIFFERENTIAL/PLATELET - Abnormal; Notable for the following components:   WBC 11.9 (*)    Hemoglobin 11.1 (*)    RDW 16.5 (*)    Neutro Abs 9.7 (*)    All other components within normal limits  LIPASE, BLOOD - Abnormal; Notable for the following components:   Lipase 2,264 (*)    All other components within normal limits  SARS CORONAVIRUS 2 (TAT 6-24 HRS)  URINE CULTURE  LIPID PANEL  LIPASE, BLOOD  COMPREHENSIVE METABOLIC PANEL  CBC  POC SARS CORONAVIRUS 2 AG -  ED  TROPONIN I (HIGH SENSITIVITY)  TROPONIN I (HIGH SENSITIVITY)   BUN  Date Value Ref Range Status  07/23/2019 32 (H) 8 - 23 mg/dL Final  06/23/2019 25 (H) 8 - 23 mg/dL  Final  04/20/2019 23 8  - 23 mg/dL Final  05/06/2018 26 (H) 8 - 23 mg/dL Final   Creatinine, Ser  Date Value Ref Range Status  07/23/2019 1.54 (H) 0.44 - 1.00 mg/dL Final  06/23/2019 1.44 (H) 0.44 - 1.00 mg/dL Final  04/20/2019 1.45 (H) 0.44 - 1.00 mg/dL Final  05/06/2018 1.42 (H) 0.44 - 1.00 mg/dL Final     EKG EKG Interpretation  Date/Time:  Sunday July 23 2019 13:17:42 EST Ventricular Rate:  85 PR Interval:    QRS Duration: 142 QT Interval:  445 QTC Calculation: 530 R Axis:   -25 Text Interpretation: Atrial fibrillation Left bundle branch block No significant change since last tracing Confirmed by Isla Pence [phone]) on 07/23/2019 1:24:53 PM   Radiology CT Angio Chest PE W and/or Wo Contrast  Result Date: 07/23/2019 CLINICAL DATA:  Shortness of breath, chest pain, abdominal pain began at 03:00 EXAM: CT ANGIOGRAPHY CHEST CT ABDOMEN AND PELVIS WITH CONTRAST TECHNIQUE: Multidetector CT imaging of the chest was performed using the standard protocol during bolus administration of intravenous contrast. Multiplanar CT image reconstructions and MIPs were obtained to evaluate the vascular anatomy. Multidetector CT imaging of the abdomen and pelvis was performed using the standard protocol during bolus administration of intravenous contrast. CONTRAST:  43mL OMNIPAQUE IOHEXOL 350 MG/ML SOLN COMPARISON:  Chest radiograph 97/06/6376, CT renal colic 58/85/0277, CT abdomen pelvis 05/06/2018 FINDINGS: CTA CHEST FINDINGS Cardiovascular: Satisfactory opacification the pulmonary arteries to the segmental level. No pulmonary artery filling defects are identified. Central pulmonary arteries are normal caliber. Atherosclerotic plaque within the normal caliber aorta. No intramural hematoma, dissection flap or other acute luminal abnormality of the aorta is seen. No periaortic stranding or hemorrhage. Atherosclerotic calcification of the coronary arteries. Cardiac size at the upper limits of normal. Small pericardial effusion and  fluid within the pericardial recesses. Reflux of contrast into the IVC and mildly within the hepatic veins. Remaining major venous structures are unremarkable. Mediastinum/Nodes: Heterogeneous enlarged and nodular thyroid gland. Dominant nodule in the right lobe measures up to 2.5 cm (2/12) low-attenuation fluid is seen within the pericardial recesses. Few borderline enlarged low-attenuation mediastinal and hilar nodes are present instance a 10 mm left hilar node (2/86), and an 11 mm right hilar node (2/34), and a 16 mm subcarinal node (2/39). No acute abnormality of the trachea or esophagus. No worrisome axillary or visible supraclavicular adenopathy. Lungs/Pleura: Small bilateral effusions. Adjacent areas of passive atelectasis. Interlobular septal thickening noted towards the apices and lung bases. Redistributed pulmonary vascularity. Central airways thickening/cuffing. Some more patchy ground-glass towards the lung bases, favor edema. Musculoskeletal: Diffusely thickened cortex and trabecular coarsening of the left tenth rib with mild expansile changes. Mild dextrocurvature of the thoracic spine. Multilevel degenerative changes are present in the imaged portions of the spine. Additional degenerative changes in the shoulders. Review of the MIP images confirms the above findings. CT ABDOMEN and PELVIS FINDINGS Hepatobiliary: No focal liver abnormality is seen. Patient is post cholecystectomy. Slight prominence of the biliary tree likely related to reservoir effect. No calcified intraductal gallstones. Pancreas: There is slight prominence of the pancreatic duct measuring up to 5 mm the head and 3 mm at the tail/body junction. Uniform pancreatic enhancement with mild diffuse peripancreatic inflammation most focally upon the pancreatic head and uncinate process. No organized or encapsulated fluid collections are seen. Spleen: Normal in size without focal abnormality. Adrenals/Urinary Tract: Mild nodular thickening of  the adrenal glands, similar to priors. Intermediate attenuation 1.5 cm cystic lesion in  the anterior interpolar left kidney. Additional subcentimeter hypoattenuating renal lesions both kidneys. Renal vascular calcium is noted. Suspect few nonobstructing calculi in the collecting system as well. No obstructive urolithiasis or hydronephrosis. Urinary bladder is unremarkable. Stomach/Bowel: Distal esophagus and stomach are unremarkable. Mild periduodenal inflammatory changes of the first through third portions of the duodenum. Favor reactive in the absence of wall thickening or dilatation. No distal small bowel abnormality is seen. No evidence of obstruction. A normal appendix is visualized. No colonic dilatation or wall thickening. Scattered colonic diverticula without focal pericolonic inflammation to suggest diverticulitis. Vascular/Lymphatic: Atherosclerotic plaque within the normal caliber aorta and branch vessels. No suspicious or enlarged lymph nodes in the included lymphatic chains. Reproductive: Anteverted uterus. No concerning adnexal lesions. Other: Mild ventral diastasis with a superimposed fat containing umbilical hernia. No bowel containing hernias. Soft tissue thickening along the anterior abdominal wall of the appearance most compatible with localized injectable use related to insulin. Musculoskeletal: Multilevel moderate degenerative changes of the imaged thoracolumbar spine. More moderate to severe degenerative changes in the SI joints and symphysis pubis. Prior right femoral lateral plate and screw construct with transcervical pending. Some articular surface collapse near the terminus of the transcervical fixation screw in the superior right femoral head may reflect some articular surface collapse or avascular necrosis. Review of the MIP images confirms the above findings. IMPRESSION: CTA CHEST 1. No evidence of acute pulmonary artery filling defect to suggest pulmonary embolism. 2. Features most  suggestive of likely cardiogenic pulmonary edema with small bilateral effusions and reflux of contrast into the hepatic veins which can suggest some right-sided heart dysfunction or elevated right heart pressures. 3. Borderline enlarged low-attenuation mediastinal and hilar nodes, likely reactive/edematous. 4. Heterogeneous enlarged and nodular thyroid gland. Dominant right thyroid nodule measures 2.5 cm. Recommend further evaluation with nonemergent thyroid ultrasound. This follows consensus guidelines: Managing Incidental Thyroid Nodules Detected on Imaging: White Paper of the ACR Incidental Thyroid Findings Committee. J Am Coll Radiol 2015; 12:143-150. and [facility] 3-tiered system for managing ITNs: J Am Coll Radiol. 2015; Feb;12(2): 143-50 5. Diffusely thickened cortex and trabecular coarsening of the left tenth rib with mild expansile changes. This finding is favored to represent Paget's disease, less likely Paget's disease. CT ABDOMEN AND PELVIS 1. Mild diffuse peripancreatic inflammation compatible with acute pancreatitis particularly given the setting of a elevated lipase. No evidence of pancreatic necrosis, significant free fluid or organized pancreatic collections. Paraduodenal inflammation is likely reactive. 2. Mild pancreatic ductal dilatation, can be related to the acute pancreatic process. Attention on follow-up imaging is recommended. If persistently dilated, MRCP could be obtained. 3. Indeterminate intermediate attenuation 1.5 cm cyst in the left kidney, consider outpatient, nonemergent renal ultrasound. 4. Prior right femoral transcervical fixation with some sclerotic changes adjacent the tip of the transcervical fixation screw extending to the articular surface of the femoral head. May reflect avascular necrosis. 5. Ventral diastasis with a superimposed fat containing umbilical hernia. 6.  Aortic Atherosclerosis (ICD10-I70.0). These results were called by telephone at the time of interpretation on  07/23/2019 at 5:29 pm to provider [facility] , who verbally acknowledged these results. Electronically Signed   By: Lovena Le M.D.   On: 07/23/2019 17:29   CT ABDOMEN PELVIS W CONTRAST  Result Date: 07/23/2019 CLINICAL DATA:  Shortness of breath, chest pain, abdominal pain began at 03:00 EXAM: CT ANGIOGRAPHY CHEST CT ABDOMEN AND PELVIS WITH CONTRAST TECHNIQUE: Multidetector CT imaging of the chest was performed using the standard protocol during bolus administration of intravenous  contrast. Multiplanar CT image reconstructions and MIPs were obtained to evaluate the vascular anatomy. Multidetector CT imaging of the abdomen and pelvis was performed using the standard protocol during bolus administration of intravenous contrast. CONTRAST:  63mL OMNIPAQUE IOHEXOL 350 MG/ML SOLN COMPARISON:  Chest radiograph 07/37/1062, CT renal colic 69/48/5462, CT abdomen pelvis 05/06/2018 FINDINGS: CTA CHEST FINDINGS Cardiovascular: Satisfactory opacification the pulmonary arteries to the segmental level. No pulmonary artery filling defects are identified. Central pulmonary arteries are normal caliber. Atherosclerotic plaque within the normal caliber aorta. No intramural hematoma, dissection flap or other acute luminal abnormality of the aorta is seen. No periaortic stranding or hemorrhage. Atherosclerotic calcification of the coronary arteries. Cardiac size at the upper limits of normal. Small pericardial effusion and fluid within the pericardial recesses. Reflux of contrast into the IVC and mildly within the hepatic veins. Remaining major venous structures are unremarkable. Mediastinum/Nodes: Heterogeneous enlarged and nodular thyroid gland. Dominant nodule in the right lobe measures up to 2.5 cm (2/12) low-attenuation fluid is seen within the pericardial recesses. Few borderline enlarged low-attenuation mediastinal and hilar nodes are present instance a 10 mm left hilar node (2/86), and an 11 mm right hilar node (2/34), and a 16  mm subcarinal node (2/39). No acute abnormality of the trachea or esophagus. No worrisome axillary or visible supraclavicular adenopathy. Lungs/Pleura: Small bilateral effusions. Adjacent areas of passive atelectasis. Interlobular septal thickening noted towards the apices and lung bases. Redistributed pulmonary vascularity. Central airways thickening/cuffing. Some more patchy ground-glass towards the lung bases, favor edema. Musculoskeletal: Diffusely thickened cortex and trabecular coarsening of the left tenth rib with mild expansile changes. Mild dextrocurvature of the thoracic spine. Multilevel degenerative changes are present in the imaged portions of the spine. Additional degenerative changes in the shoulders. Review of the MIP images confirms the above findings. CT ABDOMEN and PELVIS FINDINGS Hepatobiliary: No focal liver abnormality is seen. Patient is post cholecystectomy. Slight prominence of the biliary tree likely related to reservoir effect. No calcified intraductal gallstones. Pancreas: There is slight prominence of the pancreatic duct measuring up to 5 mm the head and 3 mm at the tail/body junction. Uniform pancreatic enhancement with mild diffuse peripancreatic inflammation most focally upon the pancreatic head and uncinate process. No organized or encapsulated fluid collections are seen. Spleen: Normal in size without focal abnormality. Adrenals/Urinary Tract: Mild nodular thickening of the adrenal glands, similar to priors. Intermediate attenuation 1.5 cm cystic lesion in the anterior interpolar left kidney. Additional subcentimeter hypoattenuating renal lesions both kidneys. Renal vascular calcium is noted. Suspect few nonobstructing calculi in the collecting system as well. No obstructive urolithiasis or hydronephrosis. Urinary bladder is unremarkable. Stomach/Bowel: Distal esophagus and stomach are unremarkable. Mild periduodenal inflammatory changes of the first through third portions of the  duodenum. Favor reactive in the absence of wall thickening or dilatation. No distal small bowel abnormality is seen. No evidence of obstruction. A normal appendix is visualized. No colonic dilatation or wall thickening. Scattered colonic diverticula without focal pericolonic inflammation to suggest diverticulitis. Vascular/Lymphatic: Atherosclerotic plaque within the normal caliber aorta and branch vessels. No suspicious or enlarged lymph nodes in the included lymphatic chains. Reproductive: Anteverted uterus. No concerning adnexal lesions. Other: Mild ventral diastasis with a superimposed fat containing umbilical hernia. No bowel containing hernias. Soft tissue thickening along the anterior abdominal wall of the appearance most compatible with localized injectable use related to insulin. Musculoskeletal: Multilevel moderate degenerative changes of the imaged thoracolumbar spine. More moderate to severe degenerative changes in the SI joints and symphysis  pubis. Prior right femoral lateral plate and screw construct with transcervical pending. Some articular surface collapse near the terminus of the transcervical fixation screw in the superior right femoral head may reflect some articular surface collapse or avascular necrosis. Review of the MIP images confirms the above findings. IMPRESSION: CTA CHEST 1. No evidence of acute pulmonary artery filling defect to suggest pulmonary embolism. 2. Features most suggestive of likely cardiogenic pulmonary edema with small bilateral effusions and reflux of contrast into the hepatic veins which can suggest some right-sided heart dysfunction or elevated right heart pressures. 3. Borderline enlarged low-attenuation mediastinal and hilar nodes, likely reactive/edematous. 4. Heterogeneous enlarged and nodular thyroid gland. Dominant right thyroid nodule measures 2.5 cm. Recommend further evaluation with nonemergent thyroid ultrasound. This follows consensus guidelines: Managing  Incidental Thyroid Nodules Detected on Imaging: White Paper of the ACR Incidental Thyroid Findings Committee. J Am Coll Radiol 2015; 12:143-150. and [facility] 3-tiered system for managing ITNs: J Am Coll Radiol. 2015; Feb;12(2): 143-50 5. Diffusely thickened cortex and trabecular coarsening of the left tenth rib with mild expansile changes. This finding is favored to represent Paget's disease, less likely Paget's disease. CT ABDOMEN AND PELVIS 1. Mild diffuse peripancreatic inflammation compatible with acute pancreatitis particularly given the setting of a elevated lipase. No evidence of pancreatic necrosis, significant free fluid or organized pancreatic collections. Paraduodenal inflammation is likely reactive. 2. Mild pancreatic ductal dilatation, can be related to the acute pancreatic process. Attention on follow-up imaging is recommended. If persistently dilated, MRCP could be obtained. 3. Indeterminate intermediate attenuation 1.5 cm cyst in the left kidney, consider outpatient, nonemergent renal ultrasound. 4. Prior right femoral transcervical fixation with some sclerotic changes adjacent the tip of the transcervical fixation screw extending to the articular surface of the femoral head. May reflect avascular necrosis. 5. Ventral diastasis with a superimposed fat containing umbilical hernia. 6.  Aortic Atherosclerosis (ICD10-I70.0). These results were called by telephone at the time of interpretation on 07/23/2019 at 5:29 pm to provider [facility] , who verbally acknowledged these results. Electronically Signed   By: Lovena Le M.D.   On: 07/23/2019 17:29   DG Chest Portable 1 View  Result Date: 07/23/2019 CLINICAL DATA:  Wheezing. Abdominal pain since 3 a.m. today with a single episode of vomiting. EXAM: PORTABLE CHEST 1 VIEW COMPARISON:  06/23/2019 FINDINGS: Stable enlarged heart. Breathing motion blurring with a suggestion of small areas of patchy density in both lungs. No pleural fluid. Unremarkable bones. The  patient's chin is obscuring medial lung apices. IMPRESSION: 1. Stable cardiomegaly. 2. Possible small areas of pneumonia in both lungs, difficult to assess due to breathing motion blurring. Electronically Signed   By: Claudie Revering M.D.   On: 07/23/2019 14:14    Procedures Procedures (including critical care time)  Medications Ordered in ED Medications  0.9 %  sodium chloride infusion (has no administration in time range)  cefTRIAXone (ROCEPHIN) 1 g in sodium chloride 0.9 % 100 mL IVPB (has no administration in time range)  insulin aspart (novoLOG) injection 0-6 Units (has no administration in time range)  insulin aspart (novoLOG) injection 0-5 Units (has no administration in time range)  labetalol (NORMODYNE) injection 10 mg (has no administration in time range)  insulin detemir (LEVEMIR) injection 35 Units (has no administration in time range)  nitroGLYCERIN (NITROSTAT) SL tablet 0.4 mg (0.4 mg Sublingual Given 07/23/19 1435)  morphine 4 MG/ML injection 4 mg (4 mg Intravenous Given 07/23/19 1634)  ondansetron (ZOFRAN) injection 4 mg (4 mg Intravenous Given  07/23/19 1634)  iohexol (OMNIPAQUE) 350 MG/ML injection 80 mL (80 mLs Intravenous Contrast Given 07/23/19 1645)  furosemide (LASIX) injection 40 mg (40 mg Intravenous Given 07/23/19 1811)    ED Course  I have reviewed the triage vital signs and the nursing notes.  Pertinent labs & imaging results that were available during my care of the patient were reviewed by me and considered in my medical decision making (see chart for details).  Clinical Course as of Jul 23 1854  Sun Jul 23, 2019  1520 Patient states her pain is improved following the nitroglycerin.  Rates it 5/10.    [SJ]    Clinical Course User Index [SJ] Jessica Checketts, Helane Gunther, PA-C   MDM Rules/Calculators/A&P                      Patient presents with chest and upper abdominal pain. Patient is nontoxic appearing, afebrile, not tachycardic, not hypotensive, maintains adequate SPO2 on  room air.  I have reviewed the patient's chart to obtain more information.  I reviewed and interpreted the patient's labs and radiological studies. She has an elevated lipase, combined with her area of pain, suspect pancreatitis.  This was confirmed on CT.  Evidence of pulmonary edema on chest CT.  BNP not significantly elevated.  Troponin negative.  Patient admitted for further management.  Findings and plan of care discussed with Isla Pence, MD. Dr. Gilford Raid personally evaluated and examined this patient.  There were incidental findings noted on the CT.  These include bone thickening suggestive of possible Paget's disease.  Thyroid nodules were also noted. This CT scan was routed to the patient's PCP of record.  These findings were also discussed with the patient and a printout was provided to the patient.  Final Clinical Impression(s) / ED Diagnoses Final diagnoses:  Acute pancreatitis, unspecified complication status, unspecified pancreatitis type  Acute pulmonary edema [facility])    Rx / DC Orders ED Discharge Orders    None       Layla Maw 07/23/19 1924    Isla Pence, MD 07/26/19 [phone]

## 2019-07-23 NOTE — Progress Notes (Signed)
Patient given albuterol inhaler around 2130 for wheeze. Hospital does not have xopenex mdi in formulary. Xopenex nebulizer was held do to pending COVID test 6-24 hr. Patient has pancreatitis along with possible pneumonia and UTI. Patient has been having trouble with excessive HR. Albuterol will be held until HR is controlled again.

## 2019-07-23 NOTE — H&P (Addendum)
Patient Demographics:    Ysidra Sopher, is a 85 y.o. female  MRN: [id number]   DOB - Dec 27, 1934  Admit Date - 07/23/2019  Outpatient Primary MD for the patient is [name], MD   Assessment & Plan:    Principal Problem:   Pancreatitis, acute Active Problems:   New onset a-fib ([place])   Uncontrolled type 2 diabetes mellitus with diabetic nephropathy, with long-term current use of insulin ([place])   Diabetes mellitus type 2 with complications ([place])   Chronic diastolic CHF (congestive heart failure) (HCC)/EF > 60 %   CKD (chronic kidney disease), stage IIIb   Essential hypertension   1)Acute Pancreatitis--- patient with abdominal pain, nausea and vomiting, lipase over 2200 -Check fasting lipid profile, -- keep n.p.o., give IV fluids, give IV Protonix -As needed IV fentanyl and Zofran as needed -Stop simvastatin as this can at times contribute to acute pancreatitis  2)UTI--- WBC is 11.9, give IV Rocephin as ordered pending culture results  3)New Onset Atrial Fibrillation--- last known EF 60 to 65% based on echo from 06/2019--- TSH and magnesium requested - CHA2DS2- VASc score   is = 8 (HTN, DM2,dCHF,female and Age and h/o TIA/CVA  x2 each)--- Which is  equal to = 10.8  % annual risk of stroke  -Start Eliquis for secondary stroke prophylaxis  4)DM2-history of uncontrolled DM with prior A1c of 8.0, repeat A1c pending, -Patient will be n.p.o. due to acute pancreatitis, -Decrease Levemir from 80 units to 35 units,  ---Allow some permissive Hyperglycemia rather than risk life-threatening hypoglycemia in a patient with unreliable oral intake. Use Novolog/Humalog Sliding scale insulin with Accu-Cheks/Fingersticks as ordered  5)HFpEF--- patient with history of chronic diastolic dysfunction CHF and chronic LBBB--- stress  test from February 2021 without reversible ischemia with preserved EF on gated images -Echo from 06/2019 with preserved EF of 60 to 65% and grade 2 diastolic dysfunction -Hold torsemide due to acute pancreatitis and n.p.o. status- -patient with chronic lower extremity edema -BNP 104,  -Admission CTA chest suggest right-sided heart failure  6)AKI----acute kidney injury on CKD stage IIIb-   creatinine on admission=1.54  , baseline creatinine = 1.4 , --- renally adjust medications, avoid nephrotoxic agents / dehydration  / hypotension creatinine is currently 1.5 which is close to patient's baseline of 1.4 -Hold torsemide and potassium  7)HTN--stage II, restart hydralazine 75 mg twice daily,, hold torsemide,   -may use IV labetalol when necessary  Every 4 hours for systolic blood pressure over 170 mmhg  8)Abnormal imaging findings--- imaging findings suggest thyroid nodule and possible Paget's disease--- outpatient follow-up with PCP for further investigation/work-up strongly advised  With History of - Reviewed by me  Past Medical History:  Diagnosis Date  . Arthritis   . Chronic diastolic heart failure ([place])   . CKD (chronic kidney disease)   . CVA (cerebral infarction)   . Diabetes mellitus without complication ([place])   . GERD (gastroesophageal reflux disease)   .  Hemorrhoids   . Hypertension   . Stroke [facility])       Past Surgical History:  Procedure Laterality Date  . BACK SURGERY    . CATARACT EXTRACTION W/PHACO Left 02/28/2013   Procedure: CATARACT EXTRACTION PHACO AND INTRAOCULAR LENS PLACEMENT (IOL) CDE=15.60;  Surgeon: Elta Guadeloupe T. Gershon Crane, MD;  Location: [facility] ORS;  Service: Ophthalmology;  Laterality: Left;  . CHOLECYSTECTOMY    . COLONOSCOPY  12/12/2003   RMR: Normal rectum.  Long capacious tortuous colon, colonic mucosa appeared normal  . COLONOSCOPY N/A 03/21/2014   Dr. [name]: internal and external hemorrhoids, torturous colon, colonic diverticulosis   .  ESOPHAGOGASTRODUODENOSCOPY  12/28/2001   DUK:GURKY sliding hiatal hernia/. Three small bulbar ulcers, two with stigmata of bleeding and these were coagulated using heater probe unit   . ESOPHAGOGASTRODUODENOSCOPY N/A 03/21/2014   Dr. [name]: cervical esophageal web s/p dilation, negative H.pylori   . EYE SURGERY    . HIP FRACTURE SURGERY  2008  . JOINT REPLACEMENT     Rt hip, ????? sounds like just for fracture  . MALONEY DILATION N/A 03/21/2014   Procedure: [name] DILATION;  Surgeon: [name], MD;  Location: [facility] ENDO SUITE;  Service: Endoscopy;  Laterality: N/A;  . SAVORY DILATION N/A 03/21/2014   Procedure: SAVORY DILATION;  Surgeon: [name], MD;  Location: [facility] ENDO SUITE;  Service: Endoscopy;  Laterality: N/A;    Chief Complaint  Patient presents with  . Abdominal Pain      HPI:    Tayen Narang  is a 85 y.o. female with past medical history relevant for HTN, DM, CKD 3B, dCHF , chronic LBB and chronic lower extremity edema who presents to the ED with epigastric pain associated with nausea and vomiting -Emesis is without blood or bile -Patient denies diarrhea, no fevers no chills -Patient was found to be wheezing, patient denies significant dyspnea per se -She does have chronic lower extremity edema  -In ED patient found to have an irregularly irregular rhythm--with heart rate  Up to 130 range... Patient responded well to IV Cardizem followed by p.o. Cardizem -CT chest without PE however does show evidence of right-sided heart failure -CT abdomen and pelvis consistent with acute pancreatitis, lipase is found to be 2264 -UA suspicious for UTI and WBC is 11.9    Review of systems:    In addition to the HPI above,   A full Review of  Systems was done, all other systems reviewed are negative except as noted above in HPI , .    Social History:  Reviewed by me    Social History   Tobacco Use  . Smoking status: Never Smoker  . Smokeless tobacco: Never Used  Substance  Use Topics  . Alcohol use: No     Family History :  Reviewed by me    Family History  Problem Relation Age of Onset  . Crohn's disease Daughter   . Hypertension Mother   . Hypertension Sister   . Diabetes Brother   . Diabetes Sister   . Diabetes Sister   . Heart attack Sister   . Colon cancer Neg Hx     Home Medications:   Prior to Admission medications   Medication Sig Start Date End Date Taking? Authorizing Provider  fluticasone (FLONASE) 50 MCG/ACT nasal spray Place 1 spray into both nostrils daily. 07/15/17  Yes Mikhail, Maryann, DO  gabapentin (NEURONTIN) 300 MG capsule Take 300 mg by mouth 2 (two) times daily.  02/22/14  Yes  [provider]  hydrALAZINE (APRESOLINE) 50 MG tablet Take 1.5 tablets (75 mg total) by mouth 2 (two) times daily. 06/23/19 09/21/19 Yes Herminio Commons, MD  hydrocortisone (ANUSOL-HC) 2.5 % rectal cream Place 1 application rectally 2 (two) times daily. 06/19/19  Yes [name] S, PA-C  insulin aspart (NOVOLOG) 100 UNIT/ML injection Inject 30 Units into the skin 3 (three) times daily before meals.    Yes [provider]  insulin detemir (LEVEMIR) 100 UNIT/ML injection Inject 80 Units into the skin at bedtime.    Yes [provider]  pantoprazole (PROTONIX) 40 MG tablet TAKE 1 TABLET BY MOUTH ONCE DAILY. Patient taking differently: Take 40 mg by mouth daily.  07/06/16  Yes Annitta Needs, NP  potassium chloride SA (KLOR-CON) 20 MEQ tablet Take 1 tablet (20 mEq total) by mouth daily. 06/26/19  Yes Herminio Commons, MD  simvastatin (ZOCOR) 20 MG tablet Take 20 mg by mouth daily.   Yes [provider]  torsemide (DEMADEX) 20 MG tablet Take 20 mg by mouth daily. 06/15/19  Yes [provider]     Allergies:    No Known Allergies   Physical Exam:   Vitals  Blood pressure (!) 166/95, pulse (!) 104, temperature 98.1 F (36.7 C), temperature source Oral, resp. rate 20, height 5\' 4"  (1.626 m), weight 107 kg,  SpO2 99 %.  Physical Examination: General appearance - alert, obese appearing, complains of abdominal pain Mental status - alert, oriented to person, place, and time,  Eyes - sclera anicteric Nose- [place] 2L/min Neck - supple, no JVD elevation , Chest -diminished in bases, audible wheezing noted Heart - S1, S2 normal, irregularly irregular, HR was 130 Abdomen - soft, , nondistended, left upper quadrant tenderness, Lt periumbilical area tenderness without rebound or guarding.  No CVA area tenderness Neurological - screening mental status exam normal, neck supple without rigidity, cranial nerves II through XII intact, DTR's normal and symmetric Extremities -3+ pedal edema noted, intact peripheral pulses  Skin - warm, dry     Data Review:    CBC Recent Labs  Lab 07/23/19 1321  WBC 11.9*  HGB 11.1*  HCT 36.4  PLT 247  MCV 91.7  MCH 28.0  MCHC 30.5  RDW 16.5*  LYMPHSABS 1.4  MONOABS 0.7  EOSABS 0.1  BASOSABS 0.0   ------------------------------------------------------------------------------------------------------------------  Chemistries  Recent Labs  Lab 07/23/19 1321  NA 141  K 4.1  CL 106  CO2 27  GLUCOSE 203*  BUN 32*  CREATININE 1.54*  CALCIUM 8.7*  AST 17  ALT 16  ALKPHOS 55  BILITOT 0.6   ------------------------------------------------------------------------------------------------------------------ estimated creatinine clearance is 33 mL/min (A) (by C-G formula based on SCr of 1.54 mg/dL (H)). ------------------------------------------------------------------------------------------------------------------ No results for input(s): TSH, T4TOTAL, T3FREE, THYROIDAB in the last 72 hours.  Invalid input(s): FREET3   Coagulation profile No results for input(s): INR, PROTIME in the last 168 hours. ------------------------------------------------------------------------------------------------------------------- No results for input(s): DDIMER in the last  72 hours. -------------------------------------------------------------------------------------------------------------------  Cardiac Enzymes No results for input(s): CKMB, TROPONINI, MYOGLOBIN in the last 168 hours.  Invalid input(s): CK ------------------------------------------------------------------------------------------------------------------    Component Value Date/Time   BNP 104.0 (H) 07/23/2019 1321     ---------------------------------------------------------------------------------------------------------------  Urinalysis    Component Value Date/Time   COLORURINE YELLOW 07/23/2019 1340   APPEARANCEUR HAZY (A) 07/23/2019 1340   LABSPEC 1.009 07/23/2019 1340   PHURINE 6.0 07/23/2019 1340   GLUCOSEU NEGATIVE 07/23/2019 1340   HGBUR NEGATIVE 07/23/2019 1340   BILIRUBINUR  NEGATIVE 07/23/2019 1340   KETONESUR NEGATIVE 07/23/2019 1340   PROTEINUR 30 (A) 07/23/2019 1340   UROBILINOGEN 0.2 02/20/2015 1200   NITRITE NEGATIVE 07/23/2019 1340   LEUKOCYTESUR MODERATE (A) 07/23/2019 1340    ----------------------------------------------------------------------------------------------------------------   Imaging Results:    CT Angio Chest PE W and/or Wo Contrast  Result Date: 07/23/2019 CLINICAL DATA:  Shortness of breath, chest pain, abdominal pain began at 03:00 EXAM: CT ANGIOGRAPHY CHEST CT ABDOMEN AND PELVIS WITH CONTRAST TECHNIQUE: Multidetector CT imaging of the chest was performed using the standard protocol during bolus administration of intravenous contrast. Multiplanar CT image reconstructions and MIPs were obtained to evaluate the vascular anatomy. Multidetector CT imaging of the abdomen and pelvis was performed using the standard protocol during bolus administration of intravenous contrast. CONTRAST:  52mL OMNIPAQUE IOHEXOL 350 MG/ML SOLN COMPARISON:  Chest radiograph 87/56/4332, CT renal colic 95/18/8416, CT abdomen pelvis 05/06/2018 FINDINGS: CTA CHEST FINDINGS  Cardiovascular: Satisfactory opacification the pulmonary arteries to the segmental level. No pulmonary artery filling defects are identified. Central pulmonary arteries are normal caliber. Atherosclerotic plaque within the normal caliber aorta. No intramural hematoma, dissection flap or other acute luminal abnormality of the aorta is seen. No periaortic stranding or hemorrhage. Atherosclerotic calcification of the coronary arteries. Cardiac size at the upper limits of normal. Small pericardial effusion and fluid within the pericardial recesses. Reflux of contrast into the IVC and mildly within the hepatic veins. Remaining major venous structures are unremarkable. Mediastinum/Nodes: Heterogeneous enlarged and nodular thyroid gland. Dominant nodule in the right lobe measures up to 2.5 cm (2/12) low-attenuation fluid is seen within the pericardial recesses. Few borderline enlarged low-attenuation mediastinal and hilar nodes are present instance a 10 mm left hilar node (2/86), and an 11 mm right hilar node (2/34), and a 16 mm subcarinal node (2/39). No acute abnormality of the trachea or esophagus. No worrisome axillary or visible supraclavicular adenopathy. Lungs/Pleura: Small bilateral effusions. Adjacent areas of passive atelectasis. Interlobular septal thickening noted towards the apices and lung bases. Redistributed pulmonary vascularity. Central airways thickening/cuffing. Some more patchy ground-glass towards the lung bases, favor edema. Musculoskeletal: Diffusely thickened cortex and trabecular coarsening of the left tenth rib with mild expansile changes. Mild dextrocurvature of the thoracic spine. Multilevel degenerative changes are present in the imaged portions of the spine. Additional degenerative changes in the shoulders. Review of the MIP images confirms the above findings. CT ABDOMEN and PELVIS FINDINGS Hepatobiliary: No focal liver abnormality is seen. Patient is post cholecystectomy. Slight prominence  of the biliary tree likely related to reservoir effect. No calcified intraductal gallstones. Pancreas: There is slight prominence of the pancreatic duct measuring up to 5 mm the head and 3 mm at the tail/body junction. Uniform pancreatic enhancement with mild diffuse peripancreatic inflammation most focally upon the pancreatic head and uncinate process. No organized or encapsulated fluid collections are seen. Spleen: Normal in size without focal abnormality. Adrenals/Urinary Tract: Mild nodular thickening of the adrenal glands, similar to priors. Intermediate attenuation 1.5 cm cystic lesion in the anterior interpolar left kidney. Additional subcentimeter hypoattenuating renal lesions both kidneys. Renal vascular calcium is noted. Suspect few nonobstructing calculi in the collecting system as well. No obstructive urolithiasis or hydronephrosis. Urinary bladder is unremarkable. Stomach/Bowel: Distal esophagus and stomach are unremarkable. Mild periduodenal inflammatory changes of the first through third portions of the duodenum. Favor reactive in the absence of wall thickening or dilatation. No distal small bowel abnormality is seen. No evidence of obstruction. A normal appendix is visualized. No colonic dilatation  or wall thickening. Scattered colonic diverticula without focal pericolonic inflammation to suggest diverticulitis. Vascular/Lymphatic: Atherosclerotic plaque within the normal caliber aorta and branch vessels. No suspicious or enlarged lymph nodes in the included lymphatic chains. Reproductive: Anteverted uterus. No concerning adnexal lesions. Other: Mild ventral diastasis with a superimposed fat containing umbilical hernia. No bowel containing hernias. Soft tissue thickening along the anterior abdominal wall of the appearance most compatible with localized injectable use related to insulin. Musculoskeletal: Multilevel moderate degenerative changes of the imaged thoracolumbar spine. More moderate to  severe degenerative changes in the SI joints and symphysis pubis. Prior right femoral lateral plate and screw construct with transcervical pending. Some articular surface collapse near the terminus of the transcervical fixation screw in the superior right femoral head may reflect some articular surface collapse or avascular necrosis. Review of the MIP images confirms the above findings. IMPRESSION: CTA CHEST 1. No evidence of acute pulmonary artery filling defect to suggest pulmonary embolism. 2. Features most suggestive of likely cardiogenic pulmonary edema with small bilateral effusions and reflux of contrast into the hepatic veins which can suggest some right-sided heart dysfunction or elevated right heart pressures. 3. Borderline enlarged low-attenuation mediastinal and hilar nodes, likely reactive/edematous. 4. Heterogeneous enlarged and nodular thyroid gland. Dominant right thyroid nodule measures 2.5 cm. Recommend further evaluation with nonemergent thyroid ultrasound. This follows consensus guidelines: Managing Incidental Thyroid Nodules Detected on Imaging: White Paper of the ACR Incidental Thyroid Findings Committee. J Am Coll Radiol 2015; 12:143-150. and [employer] 3-tiered system for managing ITNs: J Am Coll Radiol. 2015; Feb;12(2): 143-50 5. Diffusely thickened cortex and trabecular coarsening of the left tenth rib with mild expansile changes. This finding is favored to represent Paget's disease, less likely Paget's disease. CT ABDOMEN AND PELVIS 1. Mild diffuse peripancreatic inflammation compatible with acute pancreatitis particularly given the setting of a elevated lipase. No evidence of pancreatic necrosis, significant free fluid or organized pancreatic collections. Paraduodenal inflammation is likely reactive. 2. Mild pancreatic ductal dilatation, can be related to the acute pancreatic process. Attention on follow-up imaging is recommended. If persistently dilated, MRCP could be obtained. 3.  Indeterminate intermediate attenuation 1.5 cm cyst in the left kidney, consider outpatient, nonemergent renal ultrasound. 4. Prior right femoral transcervical fixation with some sclerotic changes adjacent the tip of the transcervical fixation screw extending to the articular surface of the femoral head. May reflect avascular necrosis. 5. Ventral diastasis with a superimposed fat containing umbilical hernia. 6.  Aortic Atherosclerosis (ICD10-I70.0). These results were called by telephone at the time of interpretation on 07/23/2019 at 5:29 pm to provider [facility] , who verbally acknowledged these results. Electronically Signed   By: Lovena Le M.D.   On: 07/23/2019 17:29   CT ABDOMEN PELVIS W CONTRAST  Result Date: 07/23/2019 CLINICAL DATA:  Shortness of breath, chest pain, abdominal pain began at 03:00 EXAM: CT ANGIOGRAPHY CHEST CT ABDOMEN AND PELVIS WITH CONTRAST TECHNIQUE: Multidetector CT imaging of the chest was performed using the standard protocol during bolus administration of intravenous contrast. Multiplanar CT image reconstructions and MIPs were obtained to evaluate the vascular anatomy. Multidetector CT imaging of the abdomen and pelvis was performed using the standard protocol during bolus administration of intravenous contrast. CONTRAST:  37mL OMNIPAQUE IOHEXOL 350 MG/ML SOLN COMPARISON:  Chest radiograph 95/18/8416, CT renal colic 60/63/0160, CT abdomen pelvis 05/06/2018 FINDINGS: CTA CHEST FINDINGS Cardiovascular: Satisfactory opacification the pulmonary arteries to the segmental level. No pulmonary artery filling defects are identified. Central pulmonary arteries are normal caliber. Atherosclerotic plaque within  the normal caliber aorta. No intramural hematoma, dissection flap or other acute luminal abnormality of the aorta is seen. No periaortic stranding or hemorrhage. Atherosclerotic calcification of the coronary arteries. Cardiac size at the upper limits of normal. Small pericardial effusion  and fluid within the pericardial recesses. Reflux of contrast into the IVC and mildly within the hepatic veins. Remaining major venous structures are unremarkable. Mediastinum/Nodes: Heterogeneous enlarged and nodular thyroid gland. Dominant nodule in the right lobe measures up to 2.5 cm (2/12) low-attenuation fluid is seen within the pericardial recesses. Few borderline enlarged low-attenuation mediastinal and hilar nodes are present instance a 10 mm left hilar node (2/86), and an 11 mm right hilar node (2/34), and a 16 mm subcarinal node (2/39). No acute abnormality of the trachea or esophagus. No worrisome axillary or visible supraclavicular adenopathy. Lungs/Pleura: Small bilateral effusions. Adjacent areas of passive atelectasis. Interlobular septal thickening noted towards the apices and lung bases. Redistributed pulmonary vascularity. Central airways thickening/cuffing. Some more patchy ground-glass towards the lung bases, favor edema. Musculoskeletal: Diffusely thickened cortex and trabecular coarsening of the left tenth rib with mild expansile changes. Mild dextrocurvature of the thoracic spine. Multilevel degenerative changes are present in the imaged portions of the spine. Additional degenerative changes in the shoulders. Review of the MIP images confirms the above findings. CT ABDOMEN and PELVIS FINDINGS Hepatobiliary: No focal liver abnormality is seen. Patient is post cholecystectomy. Slight prominence of the biliary tree likely related to reservoir effect. No calcified intraductal gallstones. Pancreas: There is slight prominence of the pancreatic duct measuring up to 5 mm the head and 3 mm at the tail/body junction. Uniform pancreatic enhancement with mild diffuse peripancreatic inflammation most focally upon the pancreatic head and uncinate process. No organized or encapsulated fluid collections are seen. Spleen: Normal in size without focal abnormality. Adrenals/Urinary Tract: Mild nodular  thickening of the adrenal glands, similar to priors. Intermediate attenuation 1.5 cm cystic lesion in the anterior interpolar left kidney. Additional subcentimeter hypoattenuating renal lesions both kidneys. Renal vascular calcium is noted. Suspect few nonobstructing calculi in the collecting system as well. No obstructive urolithiasis or hydronephrosis. Urinary bladder is unremarkable. Stomach/Bowel: Distal esophagus and stomach are unremarkable. Mild periduodenal inflammatory changes of the first through third portions of the duodenum. Favor reactive in the absence of wall thickening or dilatation. No distal small bowel abnormality is seen. No evidence of obstruction. A normal appendix is visualized. No colonic dilatation or wall thickening. Scattered colonic diverticula without focal pericolonic inflammation to suggest diverticulitis. Vascular/Lymphatic: Atherosclerotic plaque within the normal caliber aorta and branch vessels. No suspicious or enlarged lymph nodes in the included lymphatic chains. Reproductive: Anteverted uterus. No concerning adnexal lesions. Other: Mild ventral diastasis with a superimposed fat containing umbilical hernia. No bowel containing hernias. Soft tissue thickening along the anterior abdominal wall of the appearance most compatible with localized injectable use related to insulin. Musculoskeletal: Multilevel moderate degenerative changes of the imaged thoracolumbar spine. More moderate to severe degenerative changes in the SI joints and symphysis pubis. Prior right femoral lateral plate and screw construct with transcervical pending. Some articular surface collapse near the terminus of the transcervical fixation screw in the superior right femoral head may reflect some articular surface collapse or avascular necrosis. Review of the MIP images confirms the above findings. IMPRESSION: CTA CHEST 1. No evidence of acute pulmonary artery filling defect to suggest pulmonary embolism. 2.  Features most suggestive of likely cardiogenic pulmonary edema with small bilateral effusions and reflux of contrast into the hepatic  veins which can suggest some right-sided heart dysfunction or elevated right heart pressures. 3. Borderline enlarged low-attenuation mediastinal and hilar nodes, likely reactive/edematous. 4. Heterogeneous enlarged and nodular thyroid gland. Dominant right thyroid nodule measures 2.5 cm. Recommend further evaluation with nonemergent thyroid ultrasound. This follows consensus guidelines: Managing Incidental Thyroid Nodules Detected on Imaging: White Paper of the ACR Incidental Thyroid Findings Committee. J Am Coll Radiol 2015; 12:143-150. and [facility] 3-tiered system for managing ITNs: J Am Coll Radiol. 2015; Feb;12(2): 143-50 5. Diffusely thickened cortex and trabecular coarsening of the left tenth rib with mild expansile changes. This finding is favored to represent Paget's disease, less likely Paget's disease. CT ABDOMEN AND PELVIS 1. Mild diffuse peripancreatic inflammation compatible with acute pancreatitis particularly given the setting of a elevated lipase. No evidence of pancreatic necrosis, significant free fluid or organized pancreatic collections. Paraduodenal inflammation is likely reactive. 2. Mild pancreatic ductal dilatation, can be related to the acute pancreatic process. Attention on follow-up imaging is recommended. If persistently dilated, MRCP could be obtained. 3. Indeterminate intermediate attenuation 1.5 cm cyst in the left kidney, consider outpatient, nonemergent renal ultrasound. 4. Prior right femoral transcervical fixation with some sclerotic changes adjacent the tip of the transcervical fixation screw extending to the articular surface of the femoral head. May reflect avascular necrosis. 5. Ventral diastasis with a superimposed fat containing umbilical hernia. 6.  Aortic Atherosclerosis (ICD10-I70.0). These results were called by telephone at the time of  interpretation on 07/23/2019 at 5:29 pm to provider [facility] , who verbally acknowledged these results. Electronically Signed   By: Lovena Le M.D.   On: 07/23/2019 17:29   DG Chest Portable 1 View  Result Date: 07/23/2019 CLINICAL DATA:  Wheezing. Abdominal pain since 3 a.m. today with a single episode of vomiting. EXAM: PORTABLE CHEST 1 VIEW COMPARISON:  06/23/2019 FINDINGS: Stable enlarged heart. Breathing motion blurring with a suggestion of small areas of patchy density in both lungs. No pleural fluid. Unremarkable bones. The patient's chin is obscuring medial lung apices. IMPRESSION: 1. Stable cardiomegaly. 2. Possible small areas of pneumonia in both lungs, difficult to assess due to breathing motion blurring. Electronically Signed   By: Claudie Revering M.D.   On: 07/23/2019 14:14    Radiological Exams on Admission: CT Angio Chest PE W and/or Wo Contrast  Result Date: 07/23/2019 CLINICAL DATA:  Shortness of breath, chest pain, abdominal pain began at 03:00 EXAM: CT ANGIOGRAPHY CHEST CT ABDOMEN AND PELVIS WITH CONTRAST TECHNIQUE: Multidetector CT imaging of the chest was performed using the standard protocol during bolus administration of intravenous contrast. Multiplanar CT image reconstructions and MIPs were obtained to evaluate the vascular anatomy. Multidetector CT imaging of the abdomen and pelvis was performed using the standard protocol during bolus administration of intravenous contrast. CONTRAST:  40mL OMNIPAQUE IOHEXOL 350 MG/ML SOLN COMPARISON:  Chest radiograph 51/88/4166, CT renal colic 11/15/1599, CT abdomen pelvis 05/06/2018 FINDINGS: CTA CHEST FINDINGS Cardiovascular: Satisfactory opacification the pulmonary arteries to the segmental level. No pulmonary artery filling defects are identified. Central pulmonary arteries are normal caliber. Atherosclerotic plaque within the normal caliber aorta. No intramural hematoma, dissection flap or other acute luminal abnormality of the aorta is seen.  No periaortic stranding or hemorrhage. Atherosclerotic calcification of the coronary arteries. Cardiac size at the upper limits of normal. Small pericardial effusion and fluid within the pericardial recesses. Reflux of contrast into the IVC and mildly within the hepatic veins. Remaining major venous structures are unremarkable. Mediastinum/Nodes: Heterogeneous enlarged and nodular thyroid gland. Dominant  nodule in the right lobe measures up to 2.5 cm (2/12) low-attenuation fluid is seen within the pericardial recesses. Few borderline enlarged low-attenuation mediastinal and hilar nodes are present instance a 10 mm left hilar node (2/86), and an 11 mm right hilar node (2/34), and a 16 mm subcarinal node (2/39). No acute abnormality of the trachea or esophagus. No worrisome axillary or visible supraclavicular adenopathy. Lungs/Pleura: Small bilateral effusions. Adjacent areas of passive atelectasis. Interlobular septal thickening noted towards the apices and lung bases. Redistributed pulmonary vascularity. Central airways thickening/cuffing. Some more patchy ground-glass towards the lung bases, favor edema. Musculoskeletal: Diffusely thickened cortex and trabecular coarsening of the left tenth rib with mild expansile changes. Mild dextrocurvature of the thoracic spine. Multilevel degenerative changes are present in the imaged portions of the spine. Additional degenerative changes in the shoulders. Review of the MIP images confirms the above findings. CT ABDOMEN and PELVIS FINDINGS Hepatobiliary: No focal liver abnormality is seen. Patient is post cholecystectomy. Slight prominence of the biliary tree likely related to reservoir effect. No calcified intraductal gallstones. Pancreas: There is slight prominence of the pancreatic duct measuring up to 5 mm the head and 3 mm at the tail/body junction. Uniform pancreatic enhancement with mild diffuse peripancreatic inflammation most focally upon the pancreatic head and  uncinate process. No organized or encapsulated fluid collections are seen. Spleen: Normal in size without focal abnormality. Adrenals/Urinary Tract: Mild nodular thickening of the adrenal glands, similar to priors. Intermediate attenuation 1.5 cm cystic lesion in the anterior interpolar left kidney. Additional subcentimeter hypoattenuating renal lesions both kidneys. Renal vascular calcium is noted. Suspect few nonobstructing calculi in the collecting system as well. No obstructive urolithiasis or hydronephrosis. Urinary bladder is unremarkable. Stomach/Bowel: Distal esophagus and stomach are unremarkable. Mild periduodenal inflammatory changes of the first through third portions of the duodenum. Favor reactive in the absence of wall thickening or dilatation. No distal small bowel abnormality is seen. No evidence of obstruction. A normal appendix is visualized. No colonic dilatation or wall thickening. Scattered colonic diverticula without focal pericolonic inflammation to suggest diverticulitis. Vascular/Lymphatic: Atherosclerotic plaque within the normal caliber aorta and branch vessels. No suspicious or enlarged lymph nodes in the included lymphatic chains. Reproductive: Anteverted uterus. No concerning adnexal lesions. Other: Mild ventral diastasis with a superimposed fat containing umbilical hernia. No bowel containing hernias. Soft tissue thickening along the anterior abdominal wall of the appearance most compatible with localized injectable use related to insulin. Musculoskeletal: Multilevel moderate degenerative changes of the imaged thoracolumbar spine. More moderate to severe degenerative changes in the SI joints and symphysis pubis. Prior right femoral lateral plate and screw construct with transcervical pending. Some articular surface collapse near the terminus of the transcervical fixation screw in the superior right femoral head may reflect some articular surface collapse or avascular necrosis. Review  of the MIP images confirms the above findings. IMPRESSION: CTA CHEST 1. No evidence of acute pulmonary artery filling defect to suggest pulmonary embolism. 2. Features most suggestive of likely cardiogenic pulmonary edema with small bilateral effusions and reflux of contrast into the hepatic veins which can suggest some right-sided heart dysfunction or elevated right heart pressures. 3. Borderline enlarged low-attenuation mediastinal and hilar nodes, likely reactive/edematous. 4. Heterogeneous enlarged and nodular thyroid gland. Dominant right thyroid nodule measures 2.5 cm. Recommend further evaluation with nonemergent thyroid ultrasound. This follows consensus guidelines: Managing Incidental Thyroid Nodules Detected on Imaging: White Paper of the ACR Incidental Thyroid Findings Committee. J Am Coll Radiol 2015; 12:143-150. and [facility] 3-tiered system  for managing ITNs: J Am Coll Radiol. 2015; Feb;12(2): 143-50 5. Diffusely thickened cortex and trabecular coarsening of the left tenth rib with mild expansile changes. This finding is favored to represent Paget's disease, less likely Paget's disease. CT ABDOMEN AND PELVIS 1. Mild diffuse peripancreatic inflammation compatible with acute pancreatitis particularly given the setting of a elevated lipase. No evidence of pancreatic necrosis, significant free fluid or organized pancreatic collections. Paraduodenal inflammation is likely reactive. 2. Mild pancreatic ductal dilatation, can be related to the acute pancreatic process. Attention on follow-up imaging is recommended. If persistently dilated, MRCP could be obtained. 3. Indeterminate intermediate attenuation 1.5 cm cyst in the left kidney, consider outpatient, nonemergent renal ultrasound. 4. Prior right femoral transcervical fixation with some sclerotic changes adjacent the tip of the transcervical fixation screw extending to the articular surface of the femoral head. May reflect avascular necrosis. 5. Ventral  diastasis with a superimposed fat containing umbilical hernia. 6.  Aortic Atherosclerosis (ICD10-I70.0). These results were called by telephone at the time of interpretation on 07/23/2019 at 5:29 pm to provider [facility] , who verbally acknowledged these results. Electronically Signed   By: Lovena Le M.D.   On: 07/23/2019 17:29   CT ABDOMEN PELVIS W CONTRAST  Result Date: 07/23/2019 CLINICAL DATA:  Shortness of breath, chest pain, abdominal pain began at 03:00 EXAM: CT ANGIOGRAPHY CHEST CT ABDOMEN AND PELVIS WITH CONTRAST TECHNIQUE: Multidetector CT imaging of the chest was performed using the standard protocol during bolus administration of intravenous contrast. Multiplanar CT image reconstructions and MIPs were obtained to evaluate the vascular anatomy. Multidetector CT imaging of the abdomen and pelvis was performed using the standard protocol during bolus administration of intravenous contrast. CONTRAST:  86mL OMNIPAQUE IOHEXOL 350 MG/ML SOLN COMPARISON:  Chest radiograph 73/22/0254, CT renal colic 27/10/2374, CT abdomen pelvis 05/06/2018 FINDINGS: CTA CHEST FINDINGS Cardiovascular: Satisfactory opacification the pulmonary arteries to the segmental level. No pulmonary artery filling defects are identified. Central pulmonary arteries are normal caliber. Atherosclerotic plaque within the normal caliber aorta. No intramural hematoma, dissection flap or other acute luminal abnormality of the aorta is seen. No periaortic stranding or hemorrhage. Atherosclerotic calcification of the coronary arteries. Cardiac size at the upper limits of normal. Small pericardial effusion and fluid within the pericardial recesses. Reflux of contrast into the IVC and mildly within the hepatic veins. Remaining major venous structures are unremarkable. Mediastinum/Nodes: Heterogeneous enlarged and nodular thyroid gland. Dominant nodule in the right lobe measures up to 2.5 cm (2/12) low-attenuation fluid is seen within the pericardial  recesses. Few borderline enlarged low-attenuation mediastinal and hilar nodes are present instance a 10 mm left hilar node (2/86), and an 11 mm right hilar node (2/34), and a 16 mm subcarinal node (2/39). No acute abnormality of the trachea or esophagus. No worrisome axillary or visible supraclavicular adenopathy. Lungs/Pleura: Small bilateral effusions. Adjacent areas of passive atelectasis. Interlobular septal thickening noted towards the apices and lung bases. Redistributed pulmonary vascularity. Central airways thickening/cuffing. Some more patchy ground-glass towards the lung bases, favor edema. Musculoskeletal: Diffusely thickened cortex and trabecular coarsening of the left tenth rib with mild expansile changes. Mild dextrocurvature of the thoracic spine. Multilevel degenerative changes are present in the imaged portions of the spine. Additional degenerative changes in the shoulders. Review of the MIP images confirms the above findings. CT ABDOMEN and PELVIS FINDINGS Hepatobiliary: No focal liver abnormality is seen. Patient is post cholecystectomy. Slight prominence of the biliary tree likely related to reservoir effect. No calcified intraductal gallstones. Pancreas: There  is slight prominence of the pancreatic duct measuring up to 5 mm the head and 3 mm at the tail/body junction. Uniform pancreatic enhancement with mild diffuse peripancreatic inflammation most focally upon the pancreatic head and uncinate process. No organized or encapsulated fluid collections are seen. Spleen: Normal in size without focal abnormality. Adrenals/Urinary Tract: Mild nodular thickening of the adrenal glands, similar to priors. Intermediate attenuation 1.5 cm cystic lesion in the anterior interpolar left kidney. Additional subcentimeter hypoattenuating renal lesions both kidneys. Renal vascular calcium is noted. Suspect few nonobstructing calculi in the collecting system as well. No obstructive urolithiasis or hydronephrosis.  Urinary bladder is unremarkable. Stomach/Bowel: Distal esophagus and stomach are unremarkable. Mild periduodenal inflammatory changes of the first through third portions of the duodenum. Favor reactive in the absence of wall thickening or dilatation. No distal small bowel abnormality is seen. No evidence of obstruction. A normal appendix is visualized. No colonic dilatation or wall thickening. Scattered colonic diverticula without focal pericolonic inflammation to suggest diverticulitis. Vascular/Lymphatic: Atherosclerotic plaque within the normal caliber aorta and branch vessels. No suspicious or enlarged lymph nodes in the included lymphatic chains. Reproductive: Anteverted uterus. No concerning adnexal lesions. Other: Mild ventral diastasis with a superimposed fat containing umbilical hernia. No bowel containing hernias. Soft tissue thickening along the anterior abdominal wall of the appearance most compatible with localized injectable use related to insulin. Musculoskeletal: Multilevel moderate degenerative changes of the imaged thoracolumbar spine. More moderate to severe degenerative changes in the SI joints and symphysis pubis. Prior right femoral lateral plate and screw construct with transcervical pending. Some articular surface collapse near the terminus of the transcervical fixation screw in the superior right femoral head may reflect some articular surface collapse or avascular necrosis. Review of the MIP images confirms the above findings. IMPRESSION: CTA CHEST 1. No evidence of acute pulmonary artery filling defect to suggest pulmonary embolism. 2. Features most suggestive of likely cardiogenic pulmonary edema with small bilateral effusions and reflux of contrast into the hepatic veins which can suggest some right-sided heart dysfunction or elevated right heart pressures. 3. Borderline enlarged low-attenuation mediastinal and hilar nodes, likely reactive/edematous. 4. Heterogeneous enlarged and nodular  thyroid gland. Dominant right thyroid nodule measures 2.5 cm. Recommend further evaluation with nonemergent thyroid ultrasound. This follows consensus guidelines: Managing Incidental Thyroid Nodules Detected on Imaging: White Paper of the ACR Incidental Thyroid Findings Committee. J Am Coll Radiol 2015; 12:143-150. and [facility] 3-tiered system for managing ITNs: J Am Coll Radiol. 2015; Feb;12(2): 143-50 5. Diffusely thickened cortex and trabecular coarsening of the left tenth rib with mild expansile changes. This finding is favored to represent Paget's disease, less likely Paget's disease. CT ABDOMEN AND PELVIS 1. Mild diffuse peripancreatic inflammation compatible with acute pancreatitis particularly given the setting of a elevated lipase. No evidence of pancreatic necrosis, significant free fluid or organized pancreatic collections. Paraduodenal inflammation is likely reactive. 2. Mild pancreatic ductal dilatation, can be related to the acute pancreatic process. Attention on follow-up imaging is recommended. If persistently dilated, MRCP could be obtained. 3. Indeterminate intermediate attenuation 1.5 cm cyst in the left kidney, consider outpatient, nonemergent renal ultrasound. 4. Prior right femoral transcervical fixation with some sclerotic changes adjacent the tip of the transcervical fixation screw extending to the articular surface of the femoral head. May reflect avascular necrosis. 5. Ventral diastasis with a superimposed fat containing umbilical hernia. 6.  Aortic Atherosclerosis (ICD10-I70.0). These results were called by telephone at the time of interpretation on 07/23/2019 at 5:29 pm to provider [facility] ,  who verbally acknowledged these results. Electronically Signed   By: Lovena Le M.D.   On: 07/23/2019 17:29   DG Chest Portable 1 View  Result Date: 07/23/2019 CLINICAL DATA:  Wheezing. Abdominal pain since 3 a.m. today with a single episode of vomiting. EXAM: PORTABLE CHEST 1 VIEW COMPARISON:   06/23/2019 FINDINGS: Stable enlarged heart. Breathing motion blurring with a suggestion of small areas of patchy density in both lungs. No pleural fluid. Unremarkable bones. The patient's chin is obscuring medial lung apices. IMPRESSION: 1. Stable cardiomegaly. 2. Possible small areas of pneumonia in both lungs, difficult to assess due to breathing motion blurring. Electronically Signed   By: Claudie Revering M.D.   On: 07/23/2019 14:14   DVT Prophylaxis -SCD /Eliquis AM Labs Ordered, also please review Full Orders  Family Communication: Admission, patients condition and plan of care including tests being ordered have been discussed with the patient who indicate understanding and agree with the plan   Code Status - Full Code  Likely DC to  Home when heart rate is controlled and abdominal pain is better and tolerating oral intake  Condition   Stable  Roxan Hockey M.D on 07/23/2019 at 7:14 PM Go to [email or web address] -  for contact info  [facility] - Office  [phone]

## 2019-07-23 NOTE — Progress Notes (Signed)
ANTICOAGULATION CONSULT NOTE - Initial Consult  Pharmacy Consult for Eliquis(apixaban) Indication: atrial fibrillation  No Known Allergies  Patient Measurements: Height: 5\' 4"  (162.6 cm) Weight: 236 lb (107 kg) IBW/kg (Calculated) : 54.7  Vital Signs: Temp: 98.1 F (36.7 C) (03/07 1313) Temp Source: Oral (03/07 1313) BP: 156/92 (03/07 1930) Pulse Rate: 87 (03/07 1935)  Labs: Recent Labs    07/23/19 1321 07/23/19 1526  HGB 11.1*  --   HCT 36.4  --   PLT 247  --   CREATININE 1.54*  --   TROPONINIHS 14 14    Estimated Creatinine Clearance: 33 mL/min (A) (by C-G formula based on SCr of 1.54 mg/dL (H)).   Medical History: Past Medical History:  Diagnosis Date  . Arthritis   . Chronic diastolic heart failure ([place])   . CKD (chronic kidney disease)   . CVA (cerebral infarction)   . Diabetes mellitus without complication ([place])   . GERD (gastroesophageal reflux disease)   . Hemorrhoids   . Hypertension   . Stroke [facility])     Medications:  See med rec  Assessment: 85 yo female presented with abdominal pain, N/V. Diagnosed with acute pancreatitis. In addition, patient experiencing rapid heart rate and new onset afib.  Pharmacy asked to start eliquis. Sr 1.54, 85 yo 107kg  Goal of Therapy:  Monitor platelets by anticoagulation protocol: Yes   Plan:  eliquis 2.5mg  po bid Educate on eliquis Monitor for S/S of bleeding  Isac Sarna, BS Vena [place], BCPS Clinical Pharmacist Pager [phone] 07/23/2019,7:43 PM

## 2019-07-23 NOTE — Progress Notes (Signed)
HR in 130's, on call MD notified.  No new orders at this time.

## 2019-07-23 NOTE — ED Notes (Signed)
Dr Roxan Hockey made aware of change in patient's heart rate-coming to ED to assess patient.

## 2019-07-24 DIAGNOSIS — J9602 Acute respiratory failure with hypercapnia: Secondary | ICD-10-CM | POA: Diagnosis present

## 2019-07-24 LAB — LIPID PANEL
Cholesterol: 118 mg/dL (ref 0–200)
HDL: 38 mg/dL — ABNORMAL LOW (ref >40)
LDL Cholesterol: 73 mg/dL (ref 0–99)
Total CHOL/HDL Ratio: 3.1 RATIO
Triglycerides: 35 mg/dL (ref <150)
VLDL: 7 mg/dL (ref 0–40)

## 2019-07-24 LAB — COMPREHENSIVE METABOLIC PANEL
ALT: 16 U/L (ref 0–44)
AST: 16 U/L (ref 15–41)
Albumin: 3.2 g/dL — ABNORMAL LOW (ref 3.5–5.0)
Alkaline Phosphatase: 46 U/L (ref 38–126)
Anion gap: 11 (ref 5–15)
BUN: 40 mg/dL — ABNORMAL HIGH (ref 8–23)
CO2: 23 mmol/L (ref 22–32)
Calcium: 8.1 mg/dL — ABNORMAL LOW (ref 8.9–10.3)
Chloride: 105 mmol/L (ref 98–111)
Creatinine, Ser: 2.03 mg/dL — ABNORMAL HIGH (ref 0.44–1.00)
GFR calc Af Amer: 26 mL/min — ABNORMAL LOW (ref >60)
GFR calc non Af Amer: 22 mL/min — ABNORMAL LOW (ref >60)
Glucose, Bld: 386 mg/dL — ABNORMAL HIGH (ref 70–99)
Potassium: 4.9 mmol/L (ref 3.5–5.1)
Sodium: 139 mmol/L (ref 135–145)
Total Bilirubin: 0.5 mg/dL (ref 0.3–1.2)
Total Protein: 7.2 g/dL (ref 6.5–8.1)

## 2019-07-24 LAB — CBC
HCT: 33.5 % — ABNORMAL LOW (ref 36.0–46.0)
Hemoglobin: 10 g/dL — ABNORMAL LOW (ref 12.0–15.0)
MCH: 27.9 pg (ref 26.0–34.0)
MCHC: 29.9 g/dL — ABNORMAL LOW (ref 30.0–36.0)
MCV: 93.3 fL (ref 80.0–100.0)
Platelets: 204 10*3/uL (ref 150–400)
RBC: 3.59 MIL/uL — ABNORMAL LOW (ref 3.87–5.11)
RDW: 16.8 % — ABNORMAL HIGH (ref 11.5–15.5)
WBC: 8.8 10*3/uL (ref 4.0–10.5)
nRBC: 0 % (ref 0.0–0.2)

## 2019-07-24 LAB — GLUCOSE, CAPILLARY
Glucose-Capillary: 395 mg/dL — ABNORMAL HIGH (ref 70–99)
Glucose-Capillary: 372 mg/dL — ABNORMAL HIGH (ref 70–99)
Glucose-Capillary: 344 mg/dL — ABNORMAL HIGH (ref 70–99)
Glucose-Capillary: 323 mg/dL — ABNORMAL HIGH (ref 70–99)

## 2019-07-24 LAB — BLOOD GAS, ARTERIAL
Acid-base deficit: 2.3 mmol/L — ABNORMAL HIGH (ref 0.0–2.0)
Bicarbonate: 21.8 mmol/L (ref 20.0–28.0)
FIO2: 24
O2 Saturation: 96.7 %
Patient temperature: 37
pCO2 arterial: 51.4 mmHg — ABNORMAL HIGH (ref 32.0–48.0)
pH, Arterial: 7.282 — ABNORMAL LOW (ref 7.350–7.450)
pO2, Arterial: 99.8 mmHg (ref 83.0–108.0)

## 2019-07-24 LAB — HEMOGLOBIN A1C
Hgb A1c MFr Bld: 8 % — ABNORMAL HIGH (ref 4.8–5.6)
Mean Plasma Glucose: 182.9 mg/dL

## 2019-07-24 LAB — LIPASE, BLOOD: Lipase: 1489 U/L — ABNORMAL HIGH (ref 11–51)

## 2019-07-24 LAB — SARS CORONAVIRUS 2 (TAT 6-24 HRS): SARS Coronavirus 2: NEGATIVE

## 2019-07-24 MED ORDER — HYDRALAZINE HCL 25 MG PO TABS
50.0000 mg | ORAL_TABLET | Freq: Two times a day (BID) | ORAL | Status: DC
  Administered 2019-07-24 – 2019-07-31 (×15): 50 mg via ORAL
  Filled 2019-07-24 (×15): qty 2

## 2019-07-24 MED ORDER — MORPHINE SULFATE (PF) 2 MG/ML IV SOLN
2.0000 mg | INTRAVENOUS | Status: DC | PRN
  Administered 2019-07-24: 2 mg via INTRAVENOUS
  Filled 2019-07-24: qty 1

## 2019-07-24 MED ORDER — DILTIAZEM HCL 30 MG PO TABS
30.0000 mg | ORAL_TABLET | Freq: Once | ORAL | Status: AC
  Administered 2019-07-24: 30 mg via ORAL
  Filled 2019-07-24: qty 1

## 2019-07-24 MED ORDER — SODIUM CHLORIDE 0.45 % IV BOLUS
1000.0000 mL | Freq: Once | INTRAVENOUS | Status: AC
  Administered 2019-07-24: 1000 mL via INTRAVENOUS

## 2019-07-24 MED ORDER — CHLORHEXIDINE GLUCONATE CLOTH 2 % EX PADS
6.0000 | MEDICATED_PAD | Freq: Every day | CUTANEOUS | Status: DC
  Administered 2019-07-24 – 2019-07-31 (×8): 6 via TOPICAL

## 2019-07-24 MED ORDER — DILTIAZEM HCL 60 MG PO TABS
60.0000 mg | ORAL_TABLET | Freq: Four times a day (QID) | ORAL | Status: DC
  Administered 2019-07-24 (×3): 60 mg via ORAL
  Filled 2019-07-24 (×3): qty 1

## 2019-07-24 MED ORDER — SODIUM CHLORIDE 0.45 % IV BOLUS: 1000.0000 mL | INTRAVENOUS | Status: DC

## 2019-07-24 MED ORDER — INSULIN DETEMIR 100 UNIT/ML ~~LOC~~ SOLN
10.0000 [IU] | SUBCUTANEOUS | Status: AC
  Administered 2019-07-24: 10 [IU] via SUBCUTANEOUS
  Filled 2019-07-24: qty 0.1

## 2019-07-24 MED ORDER — INSULIN DETEMIR 100 UNIT/ML ~~LOC~~ SOLN
45.0000 [IU] | Freq: Every day | SUBCUTANEOUS | Status: DC
  Administered 2019-07-24: 45 [IU] via SUBCUTANEOUS
  Filled 2019-07-24 (×2): qty 0.45

## 2019-07-24 MED ORDER — DILTIAZEM HCL 30 MG PO TABS
30.0000 mg | ORAL_TABLET | Freq: Four times a day (QID) | ORAL | Status: DC
  Administered 2019-07-25 – 2019-07-27 (×10): 30 mg via ORAL
  Filled 2019-07-24 (×10): qty 1

## 2019-07-24 MED ORDER — PROMETHAZINE HCL 25 MG/ML IJ SOLN
12.5000 mg | Freq: Four times a day (QID) | INTRAMUSCULAR | Status: DC | PRN
  Administered 2019-07-24: 12.5 mg via INTRAVENOUS
  Filled 2019-07-24: qty 1

## 2019-07-24 NOTE — Progress Notes (Signed)
Tele called and was bradycardic at 29 for about a minute then back up over 100.  BP 173/73.  Contacted Dr. Roderic Palau

## 2019-07-24 NOTE — Progress Notes (Signed)
Inpatient Diabetes Program Recommendations  AACE/ADA: New Consensus Statement on Inpatient Glycemic Control (2015)  Target Ranges:  Prepandial:   less than 140 mg/dL      Peak postprandial:   less than 180 mg/dL (1-2 hours)      Critically ill patients:  140 - 180 mg/dL   Lab Results  Component Value Date   GLUCAP 395 (H) 07/24/2019   HGBA1C 8.0 (H) 07/23/2019    Review of Glycemic Control Results for [name], [name] (MRN [id number]) as of 07/24/2019 10:13  Ref. Range 07/23/2019 20:53 07/24/2019 07:47  Glucose-Capillary Latest Ref Range: 70 - 99 mg/dL 207 (H) 395 (H)   Diabetes history: DM 2 Outpatient Diabetes medications: Novolog 30 units tid, Levemir 80 units qhs Current orders for Inpatient glycemic control:  Levemir 35 units qhs Novolog 0-6 units tid + hs  Solumedrol 60 x 1 dose yesterday  Inpatient Diabetes Program Recommendations:    -Increase Levemir to 45 units, give additional 10 units this am.  -Consider Novolog 2 units tid meal coverage if eating >50% of meals.  Thanks,  Tama Headings RN, MSN, BC-ADM Inpatient Diabetes Coordinator Team Pager [phone] (8a-5p)

## 2019-07-24 NOTE — Progress Notes (Signed)
Towel and pad very wet with urine, despite purwick in place.

## 2019-07-24 NOTE — Progress Notes (Signed)
Tele called again and pulse down to 29 for about a second then stayed in 30's for about a minute and slowly climbed back up.  Aymptomatic.  BP  168/63 and afib at 109.  Continued to vomit after zofran.  Contacted Dr. Roderic Palau and he put in new orders.

## 2019-07-24 NOTE — Progress Notes (Signed)
PROGRESS NOTE    [name]  QZE:[id number] DOB: 1934-11-14 DOA: 07/23/2019 PCP: [name], MD    Brief Narrative:  85 year old female with a history of hypertension, diabetes, chronic kidney disease stage III, diastolic heart failure, admitted to the hospital with acute pancreatitis.  She was also noted to have new onset atrial fibrillation.  Started on IV hydration.  Started on Cardizem for rate control and Eliquis for anticoagulation.  She developed acute kidney injury on chronic kidney disease.   Assessment & Plan:   Principal Problem:   Pancreatitis, acute Active Problems:   Uncontrolled type 2 diabetes mellitus with diabetic nephropathy, with long-term current use of insulin (HCC)   CKD (chronic kidney disease), stage IIIb   Essential hypertension   Diabetes mellitus type 2 with complications (HCC)   New onset a-fib (HCC)   Chronic diastolic CHF (congestive heart failure) (HCC)/EF > 60 %   1. Acute pancreatitis.  She is status post cholecystectomy.  No calcified intraductal gallstones identified on imaging.  Treat supportively with IV fluids, bowel rest, pain management 2. New onset atrial fibrillation.  CHADSVASc of 8.  She is currently on oral diltiazem for rate control.  She has been started on anticoagulation with Eliquis.  She is having episodes of bradycardia which heart rate will go down to 29.  She is noted to have hypercarbia which could be contributing to this.  Discussed with Dr. Marlou Porch on-call for cardiology who recommended decreasing Cardizem to 30 mg every 6 hours.  Correct metabolic derangements.  Continue to monitor on telemetry. 3. Possible UTI.  Currently on Rocephin.  Follow-up urine culture 4. Acute respiratory failure with hypercarbia.  Patient is somewhat sleepy, but still awake.  We will plan on transferring to stepdown to start on BiPAP therapy.  Repeat ABG in a.m. 5. Acute on chronic diastolic congestive heart failure.  Patient has significant  right-sided heart failure with peripheral edema.  This does make management challenging since she also has acute pancreatitis and needs further IV fluids.  Monitor respiratory status closely. 6. Acute kidney injury on chronic kidney disease stage III.  Baseline creatinine 1.4.  Creatinine has trended up since admission.  Possibly related to acute pancreatitis.  She also did receive IV contrast on admission.  Continue IV fluids.  If renal function does not improve, may need to consider nephrology consult. 7. Diabetes.  She is chronically on high-dose Levemir.  This dose has been reduced since she is n.p.o.  Blood sugars are running high.  Continue to titrate insulin. 8. Hypertension.  Restart hydralazine.  She is also on Cardizem. 9. Abnormal imaging findings. Imaging findings suggest thyroid nodule and possible --- outpatient follow-up with PCP for further investigation/work-up strongly advised   DVT prophylaxis: Eliquis Code Status: Full code Family Communication: Updated daughter, [name] over the phone Disposition Plan: We will moved to stepdown unit for BiPAP therapy.  The patient is originally from home.  We will plan on discharge home once abdominal pain from pancreatitis has improved and she is able to take p.o., as well as improvement of her respiratory status.   Consultants:     Procedures:     Antimicrobials:   Ceftriaxone 3/7 >   Subjective: Patient continues to have abdominal pain.  This radiates up into her chest.  She has nausea.  She does feel short of breath.  She feels sleepy.  Denies any palpitations or dizziness or lightheadedness.  Objective: Vitals:   07/23/19 2018 07/23/19 2144 07/24/19 0535 07/24/19  1311  BP: (!) 155/60  137/76 133/73  Pulse: 85  85 (!) 118  Resp: 20  20 20   Temp: 99.4 F (37.4 C)  98.4 F (36.9 C) 98.6 F (37 C)  TempSrc: Oral  Oral   SpO2: 100% 95% 100% 100%  Weight:      Height:        Intake/Output Summary (Last 24 hours) at  07/24/2019 1841 Last data filed at 07/24/2019 1700 Gross per 24 hour  Intake 3252.01 ml  Output 2200 ml  Net 1052.01 ml   Filed Weights   07/23/19 1311 07/23/19 2017  Weight: 107 kg 105 kg    Examination:  General exam: Alert, awake, oriented x 3 Respiratory system: crackles at bases. Increased respiratory effort. Cardiovascular system:irregular. No murmurs, rubs, gallops. Gastrointestinal system: Abdomen is nondistended, soft and diffusely tender. No organomegaly or masses felt. Normal bowel sounds heard. Central nervous system: Alert and oriented. No focal neurological deficits. Extremities: 1+ edema bilaterally with chronic venous stasis changes Skin: No rashes, lesions or ulcers Psychiatry: Judgement and insight appear normal. Mood & affect appropriate.    Data Reviewed: I have personally reviewed following labs and imaging studies  CBC: Recent Labs  Lab 07/23/19 1321 07/24/19 0419  WBC 11.9* 8.8  NEUTROABS 9.7*  --   HGB 11.1* 10.0*  HCT 36.4 33.5*  MCV 91.7 93.3  PLT 247 580   Basic Metabolic Panel: Recent Labs  Lab 07/23/19 1321 07/23/19 1526 07/24/19 0419  NA 141  --  139  K 4.1  --  4.9  CL 106  --  105  CO2 27  --  23  GLUCOSE 203*  --  386*  BUN 32*  --  40*  CREATININE 1.54*  --  2.03*  CALCIUM 8.7*  --  8.1*  MG  --  2.2  --    GFR: Estimated Creatinine Clearance: 24.8 mL/min (A) (by C-G formula based on SCr of 2.03 mg/dL (H)). Liver Function Tests: Recent Labs  Lab 07/23/19 1321 07/24/19 0419  AST 17 16  ALT 16 16  ALKPHOS 55 46  BILITOT 0.6 0.5  PROT 8.0 7.2  ALBUMIN 3.6 3.2*   Recent Labs  Lab 07/23/19 1321 07/24/19 0419  LIPASE 2,264* 1,489*   No results for input(s): AMMONIA in the last 168 hours. Coagulation Profile: No results for input(s): INR, PROTIME in the last 168 hours. Cardiac Enzymes: No results for input(s): CKTOTAL, CKMB, CKMBINDEX, TROPONINI in the last 168 hours. BNP (last 3 results) No results for input(s):  PROBNP in the last 8760 hours. HbA1C: Recent Labs    07/23/19 1526  HGBA1C 8.0*   CBG: Recent Labs  Lab 07/23/19 2053 07/24/19 0747 07/24/19 1058 07/24/19 1628  GLUCAP 207* 395* 372* 344*   Lipid Profile: Recent Labs    07/24/19 0419  CHOL 118  HDL 38*  LDLCALC 73  TRIG 35  CHOLHDL 3.1   Thyroid Function Tests: Recent Labs    07/23/19 1526  TSH 1.264   Anemia Panel: No results for input(s): VITAMINB12, FOLATE, FERRITIN, TIBC, IRON, RETICCTPCT in the last 72 hours. Sepsis Labs: No results for input(s): PROCALCITON, LATICACIDVEN in the last 168 hours.  Recent Results (from the past 240 hour(s))  SARS CORONAVIRUS 2 (TAT 6-24 HRS) Nasopharyngeal Nasopharyngeal Swab     Status: None   Collection Time: 07/23/19  1:32 PM   Specimen: Nasopharyngeal Swab  Result Value Ref Range Status   SARS Coronavirus 2 NEGATIVE NEGATIVE Final  Comment: (NOTE) SARS-CoV-2 target nucleic acids are NOT DETECTED. The SARS-CoV-2 RNA is generally detectable in upper and lower respiratory specimens during the acute phase of infection. Negative results do not preclude SARS-CoV-2 infection, do not rule out co-infections with other pathogens, and should not be used as the sole basis for treatment or other patient management decisions. Negative results must be combined with clinical observations, patient history, and epidemiological information. The expected result is Negative. Fact Sheet for Patients: SugarRoll.be Fact Sheet for Healthcare Providers: https://www.woods-mathews.com/ This test is not yet approved or cleared by the [place] FDA and  has been authorized for detection and/or diagnosis of SARS-CoV-2 by FDA under an Emergency Use Authorization (EUA). This EUA will remain  in effect (meaning this test can be used) for the duration of the COVID-19 declaration under Section 56 4(b)(1) of the Act, 21 U.S.C. section 360bbb-3(b)(1), unless  the authorization is terminated or revoked sooner. Performed at [facility], [place] [address], [place], [place] [address]          Radiology Studies: CT Angio Chest PE W and/or Wo Contrast  Result Date: 07/23/2019 CLINICAL DATA:  Shortness of breath, chest pain, abdominal pain began at 03:00 EXAM: CT ANGIOGRAPHY CHEST CT ABDOMEN AND PELVIS WITH CONTRAST TECHNIQUE: Multidetector CT imaging of the chest was performed using the standard protocol during bolus administration of intravenous contrast. Multiplanar CT image reconstructions and MIPs were obtained to evaluate the vascular anatomy. Multidetector CT imaging of the abdomen and pelvis was performed using the standard protocol during bolus administration of intravenous contrast. CONTRAST:  34mL OMNIPAQUE IOHEXOL 350 MG/ML SOLN COMPARISON:  Chest radiograph 70/35/0093, CT renal colic 81/82/9937, CT abdomen pelvis 05/06/2018 FINDINGS: CTA CHEST FINDINGS Cardiovascular: Satisfactory opacification the pulmonary arteries to the segmental level. No pulmonary artery filling defects are identified. Central pulmonary arteries are normal caliber. Atherosclerotic plaque within the normal caliber aorta. No intramural hematoma, dissection flap or other acute luminal abnormality of the aorta is seen. No periaortic stranding or hemorrhage. Atherosclerotic calcification of the coronary arteries. Cardiac size at the upper limits of normal. Small pericardial effusion and fluid within the pericardial recesses. Reflux of contrast into the IVC and mildly within the hepatic veins. Remaining major venous structures are unremarkable. Mediastinum/Nodes: Heterogeneous enlarged and nodular thyroid gland. Dominant nodule in the right lobe measures up to 2.5 cm (2/12) low-attenuation fluid is seen within the pericardial recesses. Few borderline enlarged low-attenuation mediastinal and hilar nodes are present instance a 10 mm left hilar node (2/86), and an 11 mm right hilar  node (2/34), and a 16 mm subcarinal node (2/39). No acute abnormality of the trachea or esophagus. No worrisome axillary or visible supraclavicular adenopathy. Lungs/Pleura: Small bilateral effusions. Adjacent areas of passive atelectasis. Interlobular septal thickening noted towards the apices and lung bases. Redistributed pulmonary vascularity. Central airways thickening/cuffing. Some more patchy ground-glass towards the lung bases, favor edema. Musculoskeletal: Diffusely thickened cortex and trabecular coarsening of the left tenth rib with mild expansile changes. Mild dextrocurvature of the thoracic spine. Multilevel degenerative changes are present in the imaged portions of the spine. Additional degenerative changes in the shoulders. Review of the MIP images confirms the above findings. CT ABDOMEN and PELVIS FINDINGS Hepatobiliary: No focal liver abnormality is seen. Patient is post cholecystectomy. Slight prominence of the biliary tree likely related to reservoir effect. No calcified intraductal gallstones. Pancreas: There is slight prominence of the pancreatic duct measuring up to 5 mm the head and 3 mm at the tail/body junction.  Uniform pancreatic enhancement with mild diffuse peripancreatic inflammation most focally upon the pancreatic head and uncinate process. No organized or encapsulated fluid collections are seen. Spleen: Normal in size without focal abnormality. Adrenals/Urinary Tract: Mild nodular thickening of the adrenal glands, similar to priors. Intermediate attenuation 1.5 cm cystic lesion in the anterior interpolar left kidney. Additional subcentimeter hypoattenuating renal lesions both kidneys. Renal vascular calcium is noted. Suspect few nonobstructing calculi in the collecting system as well. No obstructive urolithiasis or hydronephrosis. Urinary bladder is unremarkable. Stomach/Bowel: Distal esophagus and stomach are unremarkable. Mild periduodenal inflammatory changes of the first through  third portions of the duodenum. Favor reactive in the absence of wall thickening or dilatation. No distal small bowel abnormality is seen. No evidence of obstruction. A normal appendix is visualized. No colonic dilatation or wall thickening. Scattered colonic diverticula without focal pericolonic inflammation to suggest diverticulitis. Vascular/Lymphatic: Atherosclerotic plaque within the normal caliber aorta and branch vessels. No suspicious or enlarged lymph nodes in the included lymphatic chains. Reproductive: Anteverted uterus. No concerning adnexal lesions. Other: Mild ventral diastasis with a superimposed fat containing umbilical hernia. No bowel containing hernias. Soft tissue thickening along the anterior abdominal wall of the appearance most compatible with localized injectable use related to insulin. Musculoskeletal: Multilevel moderate degenerative changes of the imaged thoracolumbar spine. More moderate to severe degenerative changes in the SI joints and symphysis pubis. Prior right femoral lateral plate and screw construct with transcervical pending. Some articular surface collapse near the terminus of the transcervical fixation screw in the superior right femoral head may reflect some articular surface collapse or avascular necrosis. Review of the MIP images confirms the above findings. IMPRESSION: CTA CHEST 1. No evidence of acute pulmonary artery filling defect to suggest pulmonary embolism. 2. Features most suggestive of likely cardiogenic pulmonary edema with small bilateral effusions and reflux of contrast into the hepatic veins which can suggest some right-sided heart dysfunction or elevated right heart pressures. 3. Borderline enlarged low-attenuation mediastinal and hilar nodes, likely reactive/edematous. 4. Heterogeneous enlarged and nodular thyroid gland. Dominant right thyroid nodule measures 2.5 cm. Recommend further evaluation with nonemergent thyroid ultrasound. This follows consensus  guidelines: Managing Incidental Thyroid Nodules Detected on Imaging: White Paper of the ACR Incidental Thyroid Findings Committee. J Am Coll Radiol 2015; 12:143-150. and [facility] 3-tiered system for managing ITNs: J Am Coll Radiol. 2015; Feb;12(2): 143-50 5. Diffusely thickened cortex and trabecular coarsening of the left tenth rib with mild expansile changes. This finding is favored to represent Paget's disease, less likely Paget's disease. CT ABDOMEN AND PELVIS 1. Mild diffuse peripancreatic inflammation compatible with acute pancreatitis particularly given the setting of a elevated lipase. No evidence of pancreatic necrosis, significant free fluid or organized pancreatic collections. Paraduodenal inflammation is likely reactive. 2. Mild pancreatic ductal dilatation, can be related to the acute pancreatic process. Attention on follow-up imaging is recommended. If persistently dilated, MRCP could be obtained. 3. Indeterminate intermediate attenuation 1.5 cm cyst in the left kidney, consider outpatient, nonemergent renal ultrasound. 4. Prior right femoral transcervical fixation with some sclerotic changes adjacent the tip of the transcervical fixation screw extending to the articular surface of the femoral head. May reflect avascular necrosis. 5. Ventral diastasis with a superimposed fat containing umbilical hernia. 6.  Aortic Atherosclerosis (ICD10-I70.0). These results were called by telephone at the time of interpretation on 07/23/2019 at 5:29 pm to provider [facility] , who verbally acknowledged these results. Electronically Signed   By: Lovena Le M.D.   On: 07/23/2019 17:29  CT ABDOMEN PELVIS W CONTRAST  Result Date: 07/23/2019 CLINICAL DATA:  Shortness of breath, chest pain, abdominal pain began at 03:00 EXAM: CT ANGIOGRAPHY CHEST CT ABDOMEN AND PELVIS WITH CONTRAST TECHNIQUE: Multidetector CT imaging of the chest was performed using the standard protocol during bolus administration of intravenous contrast.  Multiplanar CT image reconstructions and MIPs were obtained to evaluate the vascular anatomy. Multidetector CT imaging of the abdomen and pelvis was performed using the standard protocol during bolus administration of intravenous contrast. CONTRAST:  45mL OMNIPAQUE IOHEXOL 350 MG/ML SOLN COMPARISON:  Chest radiograph 85/27/7824, CT renal colic 23/53/6144, CT abdomen pelvis 05/06/2018 FINDINGS: CTA CHEST FINDINGS Cardiovascular: Satisfactory opacification the pulmonary arteries to the segmental level. No pulmonary artery filling defects are identified. Central pulmonary arteries are normal caliber. Atherosclerotic plaque within the normal caliber aorta. No intramural hematoma, dissection flap or other acute luminal abnormality of the aorta is seen. No periaortic stranding or hemorrhage. Atherosclerotic calcification of the coronary arteries. Cardiac size at the upper limits of normal. Small pericardial effusion and fluid within the pericardial recesses. Reflux of contrast into the IVC and mildly within the hepatic veins. Remaining major venous structures are unremarkable. Mediastinum/Nodes: Heterogeneous enlarged and nodular thyroid gland. Dominant nodule in the right lobe measures up to 2.5 cm (2/12) low-attenuation fluid is seen within the pericardial recesses. Few borderline enlarged low-attenuation mediastinal and hilar nodes are present instance a 10 mm left hilar node (2/86), and an 11 mm right hilar node (2/34), and a 16 mm subcarinal node (2/39). No acute abnormality of the trachea or esophagus. No worrisome axillary or visible supraclavicular adenopathy. Lungs/Pleura: Small bilateral effusions. Adjacent areas of passive atelectasis. Interlobular septal thickening noted towards the apices and lung bases. Redistributed pulmonary vascularity. Central airways thickening/cuffing. Some more patchy ground-glass towards the lung bases, favor edema. Musculoskeletal: Diffusely thickened cortex and trabecular  coarsening of the left tenth rib with mild expansile changes. Mild dextrocurvature of the thoracic spine. Multilevel degenerative changes are present in the imaged portions of the spine. Additional degenerative changes in the shoulders. Review of the MIP images confirms the above findings. CT ABDOMEN and PELVIS FINDINGS Hepatobiliary: No focal liver abnormality is seen. Patient is post cholecystectomy. Slight prominence of the biliary tree likely related to reservoir effect. No calcified intraductal gallstones. Pancreas: There is slight prominence of the pancreatic duct measuring up to 5 mm the head and 3 mm at the tail/body junction. Uniform pancreatic enhancement with mild diffuse peripancreatic inflammation most focally upon the pancreatic head and uncinate process. No organized or encapsulated fluid collections are seen. Spleen: Normal in size without focal abnormality. Adrenals/Urinary Tract: Mild nodular thickening of the adrenal glands, similar to priors. Intermediate attenuation 1.5 cm cystic lesion in the anterior interpolar left kidney. Additional subcentimeter hypoattenuating renal lesions both kidneys. Renal vascular calcium is noted. Suspect few nonobstructing calculi in the collecting system as well. No obstructive urolithiasis or hydronephrosis. Urinary bladder is unremarkable. Stomach/Bowel: Distal esophagus and stomach are unremarkable. Mild periduodenal inflammatory changes of the first through third portions of the duodenum. Favor reactive in the absence of wall thickening or dilatation. No distal small bowel abnormality is seen. No evidence of obstruction. A normal appendix is visualized. No colonic dilatation or wall thickening. Scattered colonic diverticula without focal pericolonic inflammation to suggest diverticulitis. Vascular/Lymphatic: Atherosclerotic plaque within the normal caliber aorta and branch vessels. No suspicious or enlarged lymph nodes in the included lymphatic chains.  Reproductive: Anteverted uterus. No concerning adnexal lesions. Other: Mild ventral diastasis with a  superimposed fat containing umbilical hernia. No bowel containing hernias. Soft tissue thickening along the anterior abdominal wall of the appearance most compatible with localized injectable use related to insulin. Musculoskeletal: Multilevel moderate degenerative changes of the imaged thoracolumbar spine. More moderate to severe degenerative changes in the SI joints and symphysis pubis. Prior right femoral lateral plate and screw construct with transcervical pending. Some articular surface collapse near the terminus of the transcervical fixation screw in the superior right femoral head may reflect some articular surface collapse or avascular necrosis. Review of the MIP images confirms the above findings. IMPRESSION: CTA CHEST 1. No evidence of acute pulmonary artery filling defect to suggest pulmonary embolism. 2. Features most suggestive of likely cardiogenic pulmonary edema with small bilateral effusions and reflux of contrast into the hepatic veins which can suggest some right-sided heart dysfunction or elevated right heart pressures. 3. Borderline enlarged low-attenuation mediastinal and hilar nodes, likely reactive/edematous. 4. Heterogeneous enlarged and nodular thyroid gland. Dominant right thyroid nodule measures 2.5 cm. Recommend further evaluation with nonemergent thyroid ultrasound. This follows consensus guidelines: Managing Incidental Thyroid Nodules Detected on Imaging: White Paper of the ACR Incidental Thyroid Findings Committee. J Am Coll Radiol 2015; 12:143-150. and [facility] 3-tiered system for managing ITNs: J Am Coll Radiol. 2015; Feb;12(2): 143-50 5. Diffusely thickened cortex and trabecular coarsening of the left tenth rib with mild expansile changes. This finding is favored to represent Paget's disease, less likely Paget's disease. CT ABDOMEN AND PELVIS 1. Mild diffuse peripancreatic inflammation  compatible with acute pancreatitis particularly given the setting of a elevated lipase. No evidence of pancreatic necrosis, significant free fluid or organized pancreatic collections. Paraduodenal inflammation is likely reactive. 2. Mild pancreatic ductal dilatation, can be related to the acute pancreatic process. Attention on follow-up imaging is recommended. If persistently dilated, MRCP could be obtained. 3. Indeterminate intermediate attenuation 1.5 cm cyst in the left kidney, consider outpatient, nonemergent renal ultrasound. 4. Prior right femoral transcervical fixation with some sclerotic changes adjacent the tip of the transcervical fixation screw extending to the articular surface of the femoral head. May reflect avascular necrosis. 5. Ventral diastasis with a superimposed fat containing umbilical hernia. 6.  Aortic Atherosclerosis (ICD10-I70.0). These results were called by telephone at the time of interpretation on 07/23/2019 at 5:29 pm to provider [facility] , who verbally acknowledged these results. Electronically Signed   By: Lovena Le M.D.   On: 07/23/2019 17:29   DG Chest Portable 1 View  Result Date: 07/23/2019 CLINICAL DATA:  Wheezing. Abdominal pain since 3 a.m. today with a single episode of vomiting. EXAM: PORTABLE CHEST 1 VIEW COMPARISON:  06/23/2019 FINDINGS: Stable enlarged heart. Breathing motion blurring with a suggestion of small areas of patchy density in both lungs. No pleural fluid. Unremarkable bones. The patient's chin is obscuring medial lung apices. IMPRESSION: 1. Stable cardiomegaly. 2. Possible small areas of pneumonia in both lungs, difficult to assess due to breathing motion blurring. Electronically Signed   By: Claudie Revering M.D.   On: 07/23/2019 14:14        Scheduled Meds: . apixaban  2.5 mg Oral BID  . diltiazem  60 mg Oral Q6H  . gabapentin  300 mg Oral BID  . hydrALAZINE  50 mg Oral BID  . insulin aspart  0-5 Units Subcutaneous QHS  . insulin aspart  0-6  Units Subcutaneous TID WC  . insulin detemir  45 Units Subcutaneous QHS  . pantoprazole (PROTONIX) IV  40 mg Intravenous Q24H  . sodium chloride flush  3 mL Intravenous Q12H   Continuous Infusions: . sodium chloride 150 mL/hr at 07/24/19 1336  . sodium chloride    . cefTRIAXone (ROCEPHIN)  IV 1 g (07/24/19 1732)     LOS: 1 day    Critical care procedure note Authorized and performed by: Kathie Dike Total critical care time: Approximately 35 minutes Due to high probability of clinically significant, life-threatening deterioration, the patient required my highest level of preparedness to intervene emergently and I personally spent this critical care time directly and personally managing the patient.  The critical care time included obtaining a history, examining the patient, pulse oximetry, ordering and review of studies, arranging urgent treatment with development of a management plan, evaluation of patient's response to treatment, frequent reassessment, discussions with other providers.  Critical care time was performed to assess and manage the high probability of imminent, life-threatening deterioration that could result in multiorgan failure.  It was exclusive of separate billable procedures and treating other patients and teaching time.  Please see MDM section and the rest of the of note for further information on patient assessment and treatment      Kathie Dike, MD [facility]   If 7PM-7AM, please contact night-coverage [email or web address]  07/24/2019, 6:41 PM

## 2019-07-25 ENCOUNTER — Encounter (HOSPITAL_COMMUNITY): Payer: Self-pay | Admitting: Family Medicine

## 2019-07-25 ENCOUNTER — Inpatient Hospital Stay (HOSPITAL_COMMUNITY): Payer: Medicare HMO

## 2019-07-25 DIAGNOSIS — K8500 Idiopathic acute pancreatitis without necrosis or infection: Secondary | ICD-10-CM

## 2019-07-25 DIAGNOSIS — J9602 Acute respiratory failure with hypercapnia: Secondary | ICD-10-CM

## 2019-07-25 DIAGNOSIS — I4891 Unspecified atrial fibrillation: Secondary | ICD-10-CM

## 2019-07-25 DIAGNOSIS — I5032 Chronic diastolic (congestive) heart failure: Secondary | ICD-10-CM

## 2019-07-25 DIAGNOSIS — N179 Acute kidney failure, unspecified: Secondary | ICD-10-CM | POA: Diagnosis present

## 2019-07-25 DIAGNOSIS — I4819 Other persistent atrial fibrillation: Secondary | ICD-10-CM

## 2019-07-25 DIAGNOSIS — N1832 Chronic kidney disease, stage 3b: Secondary | ICD-10-CM

## 2019-07-25 LAB — GLUCOSE, CAPILLARY
Glucose-Capillary: 305 mg/dL — ABNORMAL HIGH (ref 70–99)
Glucose-Capillary: 285 mg/dL — ABNORMAL HIGH (ref 70–99)
Glucose-Capillary: 235 mg/dL — ABNORMAL HIGH (ref 70–99)
Glucose-Capillary: 174 mg/dL — ABNORMAL HIGH (ref 70–99)

## 2019-07-25 LAB — CBC
HCT: 31.4 % — ABNORMAL LOW (ref 36.0–46.0)
Hemoglobin: 9.5 g/dL — ABNORMAL LOW (ref 12.0–15.0)
MCH: 27.9 pg (ref 26.0–34.0)
MCHC: 30.3 g/dL (ref 30.0–36.0)
MCV: 92.4 fL (ref 80.0–100.0)
Platelets: 218 10*3/uL (ref 150–400)
RBC: 3.4 MIL/uL — ABNORMAL LOW (ref 3.87–5.11)
RDW: 16.7 % — ABNORMAL HIGH (ref 11.5–15.5)
WBC: 10.7 10*3/uL — ABNORMAL HIGH (ref 4.0–10.5)
nRBC: 0 % (ref 0.0–0.2)

## 2019-07-25 LAB — COMPREHENSIVE METABOLIC PANEL
ALT: 27 U/L (ref 0–44)
AST: 23 U/L (ref 15–41)
Albumin: 3 g/dL — ABNORMAL LOW (ref 3.5–5.0)
Alkaline Phosphatase: 44 U/L (ref 38–126)
Anion gap: 8 (ref 5–15)
BUN: 58 mg/dL — ABNORMAL HIGH (ref 8–23)
CO2: 24 mmol/L (ref 22–32)
Calcium: 7.8 mg/dL — ABNORMAL LOW (ref 8.9–10.3)
Chloride: 107 mmol/L (ref 98–111)
Creatinine, Ser: 2.14 mg/dL — ABNORMAL HIGH (ref 0.44–1.00)
GFR calc Af Amer: 24 mL/min — ABNORMAL LOW (ref >60)
GFR calc non Af Amer: 21 mL/min — ABNORMAL LOW (ref >60)
Glucose, Bld: 340 mg/dL — ABNORMAL HIGH (ref 70–99)
Potassium: 4.9 mmol/L (ref 3.5–5.1)
Sodium: 139 mmol/L (ref 135–145)
Total Bilirubin: 0.4 mg/dL (ref 0.3–1.2)
Total Protein: 7 g/dL (ref 6.5–8.1)

## 2019-07-25 LAB — BLOOD GAS, ARTERIAL
Acid-base deficit: 0.3 mmol/L (ref 0.0–2.0)
Bicarbonate: 23.7 mmol/L (ref 20.0–28.0)
FIO2: 28
O2 Saturation: 95.8 %
Patient temperature: 37
pCO2 arterial: 51.6 mmHg — ABNORMAL HIGH (ref 32.0–48.0)
pH, Arterial: 7.31 — ABNORMAL LOW (ref 7.350–7.450)
pO2, Arterial: 87.1 mmHg (ref 83.0–108.0)

## 2019-07-25 LAB — LIPASE, BLOOD: Lipase: 150 U/L — ABNORMAL HIGH (ref 11–51)

## 2019-07-25 LAB — MRSA PCR SCREENING: MRSA by PCR: NEGATIVE

## 2019-07-25 LAB — TSH: TSH: 0.42 u[IU]/mL (ref 0.350–4.500)

## 2019-07-25 MED ORDER — INSULIN DETEMIR 100 UNIT/ML ~~LOC~~ SOLN
15.0000 [IU] | SUBCUTANEOUS | Status: AC
  Administered 2019-07-25: 15 [IU] via SUBCUTANEOUS
  Filled 2019-07-25: qty 0.15

## 2019-07-25 MED ORDER — INSULIN DETEMIR 100 UNIT/ML ~~LOC~~ SOLN
60.0000 [IU] | Freq: Every day | SUBCUTANEOUS | Status: DC
  Administered 2019-07-25 – 2019-07-29 (×5): 60 [IU] via SUBCUTANEOUS
  Filled 2019-07-25 (×6): qty 0.6

## 2019-07-25 NOTE — Progress Notes (Signed)
Inpatient Diabetes Program Recommendations  AACE/ADA: New Consensus Statement on Inpatient Glycemic Control (2015)  Target Ranges:  Prepandial:   less than 140 mg/dL      Peak postprandial:   less than 180 mg/dL (1-2 hours)      Critically ill patients:  140 - 180 mg/dL   Lab Results  Component Value Date   GLUCAP 305 (H) 07/25/2019   HGBA1C 8.0 (H) 07/23/2019    Review of Glycemic Control Results for KAMBREY, HAGGER (MRN [id number]) as of 07/25/2019 09:52  Ref. Range 07/24/2019 07:47 07/24/2019 10:58 07/24/2019 16:28 07/24/2019 21:02 07/25/2019 07:38  Glucose-Capillary Latest Ref Range: 70 - 99 mg/dL 395 (H) 372 (H) 344 (H) 323 (H) 305 (H)    Diabetes history: DM 2 Outpatient Diabetes medications: Novolog 30 units tid, Levemir 80 units qhs Current orders for Inpatient glycemic control:  Levemir 35 units qhs Novolog 0-6 units tid + hs  Inpatient Diabetes Program Recommendations:    Glucose trends lower, however, still >300.   -Increase Levemir to 60 units, give additional 15 units this am.  -Consider Novolog 2 units tid meal coverage if eating >50% of meals.  Thanks,  Tama Headings RN, MSN, BC-ADM Inpatient Diabetes Coordinator Team Pager [phone] (8a-5p)

## 2019-07-25 NOTE — Progress Notes (Signed)
Patient performed-17 NIF, FVC 531ml with the best of 3 attempts recorded.

## 2019-07-25 NOTE — Consult Note (Signed)
Cardiology Consultation:   Patient ID: [name]; [id number]; [birth year]   Admit date: 07/23/2019 Date of Consult: 07/25/2019  Primary Care Provider: [name], MD Primary Cardiologist: Kate Sable, MD Primary Electrophysiologist: None   Patient Profile:   [name] is an 85 y.o. female with a history of chronic diastolic heart failure, previous stroke, hypertension, type 2 diabetes mellitus, left branch block, and CKD stage III who is being seen today for the evaluation of atrial fibrillation and intermittent pauses at the request of Dr. Roderic Palau.  History of Present Illness:   [name] is currently admitted to the hospital with acute pancreatitis, concurrently diagnosed with newly documented atrial fibrillation with RVR, and being managed for hypercarbic respiratory failure requiring intermittent BiPAP.  Patient has been treated with oral diltiazem for heart rate control, also initiated on Eliquis with CHA2DS2-VASc score of 8.  Heart rate control has been reasonable, she has had intermittent pauses documented which by my review range anywhere from 1.5 seconds to just under 3 seconds.  She has had no prolonged pauses.  Recent echocardiogram obtained in mid February revealed LVEF 60 to 65% with moderate diastolic dysfunction and increased left atrial pressure, normal RV contraction, severely dilated left atrium, and trivial mitral regurgitation.  She does not report any sense of palpitations or chest pain.  Currently n.p.o.  Past Medical History:  Diagnosis Date  . Arthritis   . Chronic diastolic heart failure ([place])   . CKD (chronic kidney disease) stage 3, GFR 30-59 ml/min   . Essential hypertension   . GERD (gastroesophageal reflux disease)   . Hemorrhoids   . History of stroke   . Type 2 diabetes mellitus ([place])     Past Surgical History:  Procedure Laterality Date  . BACK SURGERY    . CATARACT EXTRACTION W/PHACO Left 02/28/2013   Procedure: CATARACT  EXTRACTION PHACO AND INTRAOCULAR LENS PLACEMENT (IOL) CDE=15.60;  Surgeon: Elta Guadeloupe T. Gershon Crane, MD;  Location: [facility];  Service: Ophthalmology;  Laterality: Left;  . CHOLECYSTECTOMY    . COLONOSCOPY  12/12/2003   RMR: Normal rectum.  Long capacious tortuous colon, colonic mucosa appeared normal  . COLONOSCOPY N/A 03/21/2014   Dr. [name]: internal and external hemorrhoids, torturous colon, colonic diverticulosis   . ESOPHAGOGASTRODUODENOSCOPY  12/28/2001   IRC:VELFY sliding hiatal hernia/. Three small bulbar ulcers, two with stigmata of bleeding and these were coagulated using heater probe unit   . ESOPHAGOGASTRODUODENOSCOPY N/A 03/21/2014   Dr. [name]: cervical esophageal web s/p dilation, negative H.pylori   . EYE SURGERY    . HIP FRACTURE SURGERY  2008  . JOINT REPLACEMENT     Rt hip, ????? sounds like just for fracture  . MALONEY DILATION N/A 03/21/2014   Procedure: [name] DILATION;  Surgeon: [name], MD;  Location: [facility];  Service: Endoscopy;  Laterality: N/A;  . SAVORY DILATION N/A 03/21/2014   Procedure: SAVORY DILATION;  Surgeon: [name], MD;  Location: [facility];  Service: Endoscopy;  Laterality: N/A;     Inpatient Medications: Scheduled Meds: . apixaban  2.5 mg Oral BID  . Chlorhexidine Gluconate Cloth  6 each Topical Daily  . diltiazem  30 mg Oral Q6H  . gabapentin  300 mg Oral BID  . hydrALAZINE  50 mg Oral BID  . insulin aspart  0-5 Units Subcutaneous QHS  . insulin aspart  0-6 Units Subcutaneous TID WC  . insulin detemir  15 Units Subcutaneous NOW  . insulin detemir  60  Units Subcutaneous QHS  . pantoprazole (PROTONIX) IV  40 mg Intravenous Q24H  . sodium chloride flush  3 mL Intravenous Q12H   Continuous Infusions: . sodium chloride 100 mL/hr at 07/25/19 1453  . sodium chloride    . cefTRIAXone (ROCEPHIN)  IV 1 g (07/24/19 1732)   PRN Meds: sodium chloride, acetaminophen **OR** acetaminophen, labetalol, levalbuterol, polyethylene glycol,  promethazine, sodium chloride flush, traZODone  Allergies:   No Known Allergies  Social History:   Social History   Socioeconomic History  . Marital status: Widowed    Spouse name: Not on file  . Number of children: 8  . Years of education: Not on file  . Highest education level: Not on file  Occupational History  . Not on file  Tobacco Use  . Smoking status: Never Smoker  . Smokeless tobacco: Never Used  Substance and Sexual Activity  . Alcohol use: No  . Drug use: No  . Sexual activity: Not on file  Other Topics Concern  . Not on file  Social History Narrative  . Not on file   Social Determinants of Health   Financial Resource Strain:   . Difficulty of Paying Living Expenses: Not on file  Food Insecurity:   . Worried About Charity fundraiser in the Last Year: Not on file  . Ran Out of Food in the Last Year: Not on file  Transportation Needs:   . Lack of Transportation (Medical): Not on file  . Lack of Transportation (Non-Medical): Not on file  Physical Activity:   . Days of Exercise per Week: Not on file  . Minutes of Exercise per Session: Not on file  Stress:   . Feeling of Stress : Not on file  Social Connections:   . Frequency of Communication with Friends and Family: Not on file  . Frequency of Social Gatherings with Friends and Family: Not on file  . Attends Religious Services: Not on file  . Active Member of Clubs or Organizations: Not on file  . Attends Archivist Meetings: Not on file  . Marital Status: Not on file  Intimate Partner Violence:   . Fear of Current or Ex-Partner: Not on file  . Emotionally Abused: Not on file  . Physically Abused: Not on file  . Sexually Abused: Not on file    Family History:   The patient's family history includes Crohn's disease in her daughter; Diabetes in her brother, sister, and sister; Heart attack in her sister; Hypertension in her mother and sister. There is no history of Colon cancer.  ROS:   Abdominal pain has improved.  No active nausea or emesis.  Physical Exam/Data:   Vitals:   07/25/19 1215 07/25/19 1300 07/25/19 1400 07/25/19 1500  BP:  (!) 147/57 135/74 114/68  Pulse: (!) 106 80 (!) 59 89  Resp: 14 15 14  (!) 32  Temp:      TempSrc:      SpO2: 97% 97% 97% 98%  Weight:      Height:        Intake/Output Summary (Last 24 hours) at 07/25/2019 1529 Last data filed at 07/25/2019 1400 Gross per 24 hour  Intake 3796.9 ml  Output --  Net 3796.9 ml   Filed Weights   07/23/19 1311 07/23/19 2017 07/24/19 2056  Weight: 107 kg 105 kg 108.5 kg   Body mass index is 41.06 kg/m.   Gen: Patient is in no acute distress. HEENT: Conjunctiva and lids normal, oropharynx clear. Neck:  Supple, elevated JVP, no thyromegaly. Lungs: Decreased breath sounds, no active wheezing. Cardiac: Irregularly irregular, no S3, soft systolic murmur, no pericardial rub. Abdomen: No guarding, mildly tender, bowel sounds present. Extremities: No pitting edema, distal pulses 2+. Skin: Warm and dry. Musculoskeletal: No kyphosis. Neuropsychiatric: Alert and oriented x3, affect grossly appropriate.  EKG:  An ECG dated 07/24/2019 was personally reviewed today and demonstrated:  Atrial fibrillation with RVR, left bundle branch block.  Telemetry:  I personally reviewed telemetry which shows atrial fibrillation.  Relevant CV Studies:  Echocardiogram 07/27/2019: 1. Left ventricular ejection fraction, by estimation, is 60 to 65%. The  left ventricle has normal function. The left ventrical has no regional  wall motion abnormalities. There is moderately increased left ventricular  hypertrophy. Left ventricular  diastolic parameters are consistent with Grade II diastolic dysfunction  (pseudonormalization). Elevated left atrial pressure.  2. Right ventricular systolic function is normal. The right ventricular  size is normal.  3. Left atrial size was severely dilated.  4. The mitral valve is normal in  structure and function. trivial mitral  valve regurgitation. No evidence of mitral stenosis.  5. The aortic valve is tricuspid. Aortic valve regurgitation is not  visualized. No aortic stenosis is present.  6. The inferior vena cava is normal in size with greater than 50%  respiratory variability, suggesting right atrial pressure of 3 mmHg.  Lexiscan Myoview 07/18/2019:  Nuclear stress EF: 55%.  The left ventricular ejection fraction is normal (55-65%).  There was no ST segment deviation noted during stress.  There is a medium defect of moderate severity present in the basal anterior, basal anteroseptal, mid anterior and mid anteroseptal location. The defect is non-reversible and consistent with breast attenuation artifact.  This is a low risk study.  Laboratory Data:  Chemistry Recent Labs  Lab 07/23/19 1321 07/24/19 0419 07/25/19 0444  NA 141 139 139  K 4.1 4.9 4.9  CL 106 105 107  CO2 27 23 24   GLUCOSE 203* 386* 340*  BUN 32* 40* 58*  CREATININE 1.54* 2.03* 2.14*  CALCIUM 8.7* 8.1* 7.8*  GFRNONAA 31* 22* 21*  GFRAA 36* 26* 24*  ANIONGAP 8 11 8     Recent Labs  Lab 07/23/19 1321 07/24/19 0419 07/25/19 0444  PROT 8.0 7.2 7.0  ALBUMIN 3.6 3.2* 3.0*  AST 17 16 23   ALT 16 16 27   ALKPHOS 55 46 44  BILITOT 0.6 0.5 0.4   Hematology Recent Labs  Lab 07/23/19 1321 07/24/19 0419 07/25/19 0444  WBC 11.9* 8.8 10.7*  RBC 3.97 3.59* 3.40*  HGB 11.1* 10.0* 9.5*  HCT 36.4 33.5* 31.4*  MCV 91.7 93.3 92.4  MCH 28.0 27.9 27.9  MCHC 30.5 29.9* 30.3  RDW 16.5* 16.8* 16.7*  PLT 247 204 218   Cardiac Enzymes Recent Labs  Lab 07/23/19 1321 07/23/19 1526  TROPONINIHS 14 14   BNP Recent Labs  Lab 07/23/19 1321  BNP 104.0*     Radiology/Studies:  CT Angio Chest PE W and/or Wo Contrast  Result Date: 07/23/2019 CLINICAL DATA:  Shortness of breath, chest pain, abdominal pain began at 03:00 EXAM: CT ANGIOGRAPHY CHEST CT ABDOMEN AND PELVIS WITH CONTRAST TECHNIQUE:  Multidetector CT imaging of the chest was performed using the standard protocol during bolus administration of intravenous contrast. Multiplanar CT image reconstructions and MIPs were obtained to evaluate the vascular anatomy. Multidetector CT imaging of the abdomen and pelvis was performed using the standard protocol during bolus administration of intravenous contrast. CONTRAST:  25mL OMNIPAQUE IOHEXOL  350 MG/ML SOLN COMPARISON:  Chest radiograph 12/87/8676, CT renal colic 72/01/4708, CT abdomen pelvis 05/06/2018 FINDINGS: CTA CHEST FINDINGS Cardiovascular: Satisfactory opacification the pulmonary arteries to the segmental level. No pulmonary artery filling defects are identified. Central pulmonary arteries are normal caliber. Atherosclerotic plaque within the normal caliber aorta. No intramural hematoma, dissection flap or other acute luminal abnormality of the aorta is seen. No periaortic stranding or hemorrhage. Atherosclerotic calcification of the coronary arteries. Cardiac size at the upper limits of normal. Small pericardial effusion and fluid within the pericardial recesses. Reflux of contrast into the IVC and mildly within the hepatic veins. Remaining major venous structures are unremarkable. Mediastinum/Nodes: Heterogeneous enlarged and nodular thyroid gland. Dominant nodule in the right lobe measures up to 2.5 cm (2/12) low-attenuation fluid is seen within the pericardial recesses. Few borderline enlarged low-attenuation mediastinal and hilar nodes are present instance a 10 mm left hilar node (2/86), and an 11 mm right hilar node (2/34), and a 16 mm subcarinal node (2/39). No acute abnormality of the trachea or esophagus. No worrisome axillary or visible supraclavicular adenopathy. Lungs/Pleura: Small bilateral effusions. Adjacent areas of passive atelectasis. Interlobular septal thickening noted towards the apices and lung bases. Redistributed pulmonary vascularity. Central airways thickening/cuffing.  Some more patchy ground-glass towards the lung bases, favor edema. Musculoskeletal: Diffusely thickened cortex and trabecular coarsening of the left tenth rib with mild expansile changes. Mild dextrocurvature of the thoracic spine. Multilevel degenerative changes are present in the imaged portions of the spine. Additional degenerative changes in the shoulders. Review of the MIP images confirms the above findings. CT ABDOMEN and PELVIS FINDINGS Hepatobiliary: No focal liver abnormality is seen. Patient is post cholecystectomy. Slight prominence of the biliary tree likely related to reservoir effect. No calcified intraductal gallstones. Pancreas: There is slight prominence of the pancreatic duct measuring up to 5 mm the head and 3 mm at the tail/body junction. Uniform pancreatic enhancement with mild diffuse peripancreatic inflammation most focally upon the pancreatic head and uncinate process. No organized or encapsulated fluid collections are seen. Spleen: Normal in size without focal abnormality. Adrenals/Urinary Tract: Mild nodular thickening of the adrenal glands, similar to priors. Intermediate attenuation 1.5 cm cystic lesion in the anterior interpolar left kidney. Additional subcentimeter hypoattenuating renal lesions both kidneys. Renal vascular calcium is noted. Suspect few nonobstructing calculi in the collecting system as well. No obstructive urolithiasis or hydronephrosis. Urinary bladder is unremarkable. Stomach/Bowel: Distal esophagus and stomach are unremarkable. Mild periduodenal inflammatory changes of the first through third portions of the duodenum. Favor reactive in the absence of wall thickening or dilatation. No distal small bowel abnormality is seen. No evidence of obstruction. A normal appendix is visualized. No colonic dilatation or wall thickening. Scattered colonic diverticula without focal pericolonic inflammation to suggest diverticulitis. Vascular/Lymphatic: Atherosclerotic plaque within  the normal caliber aorta and branch vessels. No suspicious or enlarged lymph nodes in the included lymphatic chains. Reproductive: Anteverted uterus. No concerning adnexal lesions. Other: Mild ventral diastasis with a superimposed fat containing umbilical hernia. No bowel containing hernias. Soft tissue thickening along the anterior abdominal wall of the appearance most compatible with localized injectable use related to insulin. Musculoskeletal: Multilevel moderate degenerative changes of the imaged thoracolumbar spine. More moderate to severe degenerative changes in the SI joints and symphysis pubis. Prior right femoral lateral plate and screw construct with transcervical pending. Some articular surface collapse near the terminus of the transcervical fixation screw in the superior right femoral head may reflect some articular surface collapse or avascular necrosis.  Review of the MIP images confirms the above findings. IMPRESSION: CTA CHEST 1. No evidence of acute pulmonary artery filling defect to suggest pulmonary embolism. 2. Features most suggestive of likely cardiogenic pulmonary edema with small bilateral effusions and reflux of contrast into the hepatic veins which can suggest some right-sided heart dysfunction or elevated right heart pressures. 3. Borderline enlarged low-attenuation mediastinal and hilar nodes, likely reactive/edematous. 4. Heterogeneous enlarged and nodular thyroid gland. Dominant right thyroid nodule measures 2.5 cm. Recommend further evaluation with nonemergent thyroid ultrasound. This follows consensus guidelines: Managing Incidental Thyroid Nodules Detected on Imaging: White Paper of the ACR Incidental Thyroid Findings Committee. J Am Coll Radiol 2015; 12:143-150. and [facility] 3-tiered system for managing ITNs: J Am Coll Radiol. 2015; Feb;12(2): 143-50 5. Diffusely thickened cortex and trabecular coarsening of the left tenth rib with mild expansile changes. This finding is favored to  represent Paget's disease, less likely Paget's disease. CT ABDOMEN AND PELVIS 1. Mild diffuse peripancreatic inflammation compatible with acute pancreatitis particularly given the setting of a elevated lipase. No evidence of pancreatic necrosis, significant free fluid or organized pancreatic collections. Paraduodenal inflammation is likely reactive. 2. Mild pancreatic ductal dilatation, can be related to the acute pancreatic process. Attention on follow-up imaging is recommended. If persistently dilated, MRCP could be obtained. 3. Indeterminate intermediate attenuation 1.5 cm cyst in the left kidney, consider outpatient, nonemergent renal ultrasound. 4. Prior right femoral transcervical fixation with some sclerotic changes adjacent the tip of the transcervical fixation screw extending to the articular surface of the femoral head. May reflect avascular necrosis. 5. Ventral diastasis with a superimposed fat containing umbilical hernia. 6.  Aortic Atherosclerosis (ICD10-I70.0). These results were called by telephone at the time of interpretation on 07/23/2019 at 5:29 pm to provider [facility] , who verbally acknowledged these results. Electronically Signed   By: Lovena Le M.D.   On: 07/23/2019 17:29   CT ABDOMEN PELVIS W CONTRAST  Result Date: 07/23/2019 CLINICAL DATA:  Shortness of breath, chest pain, abdominal pain began at 03:00 EXAM: CT ANGIOGRAPHY CHEST CT ABDOMEN AND PELVIS WITH CONTRAST TECHNIQUE: Multidetector CT imaging of the chest was performed using the standard protocol during bolus administration of intravenous contrast. Multiplanar CT image reconstructions and MIPs were obtained to evaluate the vascular anatomy. Multidetector CT imaging of the abdomen and pelvis was performed using the standard protocol during bolus administration of intravenous contrast. CONTRAST:  12mL OMNIPAQUE IOHEXOL 350 MG/ML SOLN COMPARISON:  Chest radiograph 58/01/9832, CT renal colic 82/50/5397, CT abdomen pelvis 05/06/2018  FINDINGS: CTA CHEST FINDINGS Cardiovascular: Satisfactory opacification the pulmonary arteries to the segmental level. No pulmonary artery filling defects are identified. Central pulmonary arteries are normal caliber. Atherosclerotic plaque within the normal caliber aorta. No intramural hematoma, dissection flap or other acute luminal abnormality of the aorta is seen. No periaortic stranding or hemorrhage. Atherosclerotic calcification of the coronary arteries. Cardiac size at the upper limits of normal. Small pericardial effusion and fluid within the pericardial recesses. Reflux of contrast into the IVC and mildly within the hepatic veins. Remaining major venous structures are unremarkable. Mediastinum/Nodes: Heterogeneous enlarged and nodular thyroid gland. Dominant nodule in the right lobe measures up to 2.5 cm (2/12) low-attenuation fluid is seen within the pericardial recesses. Few borderline enlarged low-attenuation mediastinal and hilar nodes are present instance a 10 mm left hilar node (2/86), and an 11 mm right hilar node (2/34), and a 16 mm subcarinal node (2/39). No acute abnormality of the trachea or esophagus. No worrisome axillary or visible  supraclavicular adenopathy. Lungs/Pleura: Small bilateral effusions. Adjacent areas of passive atelectasis. Interlobular septal thickening noted towards the apices and lung bases. Redistributed pulmonary vascularity. Central airways thickening/cuffing. Some more patchy ground-glass towards the lung bases, favor edema. Musculoskeletal: Diffusely thickened cortex and trabecular coarsening of the left tenth rib with mild expansile changes. Mild dextrocurvature of the thoracic spine. Multilevel degenerative changes are present in the imaged portions of the spine. Additional degenerative changes in the shoulders. Review of the MIP images confirms the above findings. CT ABDOMEN and PELVIS FINDINGS Hepatobiliary: No focal liver abnormality is seen. Patient is post  cholecystectomy. Slight prominence of the biliary tree likely related to reservoir effect. No calcified intraductal gallstones. Pancreas: There is slight prominence of the pancreatic duct measuring up to 5 mm the head and 3 mm at the tail/body junction. Uniform pancreatic enhancement with mild diffuse peripancreatic inflammation most focally upon the pancreatic head and uncinate process. No organized or encapsulated fluid collections are seen. Spleen: Normal in size without focal abnormality. Adrenals/Urinary Tract: Mild nodular thickening of the adrenal glands, similar to priors. Intermediate attenuation 1.5 cm cystic lesion in the anterior interpolar left kidney. Additional subcentimeter hypoattenuating renal lesions both kidneys. Renal vascular calcium is noted. Suspect few nonobstructing calculi in the collecting system as well. No obstructive urolithiasis or hydronephrosis. Urinary bladder is unremarkable. Stomach/Bowel: Distal esophagus and stomach are unremarkable. Mild periduodenal inflammatory changes of the first through third portions of the duodenum. Favor reactive in the absence of wall thickening or dilatation. No distal small bowel abnormality is seen. No evidence of obstruction. A normal appendix is visualized. No colonic dilatation or wall thickening. Scattered colonic diverticula without focal pericolonic inflammation to suggest diverticulitis. Vascular/Lymphatic: Atherosclerotic plaque within the normal caliber aorta and branch vessels. No suspicious or enlarged lymph nodes in the included lymphatic chains. Reproductive: Anteverted uterus. No concerning adnexal lesions. Other: Mild ventral diastasis with a superimposed fat containing umbilical hernia. No bowel containing hernias. Soft tissue thickening along the anterior abdominal wall of the appearance most compatible with localized injectable use related to insulin. Musculoskeletal: Multilevel moderate degenerative changes of the imaged  thoracolumbar spine. More moderate to severe degenerative changes in the SI joints and symphysis pubis. Prior right femoral lateral plate and screw construct with transcervical pending. Some articular surface collapse near the terminus of the transcervical fixation screw in the superior right femoral head may reflect some articular surface collapse or avascular necrosis. Review of the MIP images confirms the above findings. IMPRESSION: CTA CHEST 1. No evidence of acute pulmonary artery filling defect to suggest pulmonary embolism. 2. Features most suggestive of likely cardiogenic pulmonary edema with small bilateral effusions and reflux of contrast into the hepatic veins which can suggest some right-sided heart dysfunction or elevated right heart pressures. 3. Borderline enlarged low-attenuation mediastinal and hilar nodes, likely reactive/edematous. 4. Heterogeneous enlarged and nodular thyroid gland. Dominant right thyroid nodule measures 2.5 cm. Recommend further evaluation with nonemergent thyroid ultrasound. This follows consensus guidelines: Managing Incidental Thyroid Nodules Detected on Imaging: White Paper of the ACR Incidental Thyroid Findings Committee. J Am Coll Radiol 2015; 12:143-150. and [facility] 3-tiered system for managing ITNs: J Am Coll Radiol. 2015; Feb;12(2): 143-50 5. Diffusely thickened cortex and trabecular coarsening of the left tenth rib with mild expansile changes. This finding is favored to represent Paget's disease, less likely Paget's disease. CT ABDOMEN AND PELVIS 1. Mild diffuse peripancreatic inflammation compatible with acute pancreatitis particularly given the setting of a elevated lipase. No evidence of pancreatic necrosis, significant  free fluid or organized pancreatic collections. Paraduodenal inflammation is likely reactive. 2. Mild pancreatic ductal dilatation, can be related to the acute pancreatic process. Attention on follow-up imaging is recommended. If persistently dilated,  MRCP could be obtained. 3. Indeterminate intermediate attenuation 1.5 cm cyst in the left kidney, consider outpatient, nonemergent renal ultrasound. 4. Prior right femoral transcervical fixation with some sclerotic changes adjacent the tip of the transcervical fixation screw extending to the articular surface of the femoral head. May reflect avascular necrosis. 5. Ventral diastasis with a superimposed fat containing umbilical hernia. 6.  Aortic Atherosclerosis (ICD10-I70.0). These results were called by telephone at the time of interpretation on 07/23/2019 at 5:29 pm to provider [facility] , who verbally acknowledged these results. Electronically Signed   By: Lovena Le M.D.   On: 07/23/2019 17:29   DG CHEST PORT 1 VIEW  Result Date: 07/25/2019 CLINICAL DATA:  Shortness of breath. EXAM: PORTABLE CHEST 1 VIEW COMPARISON:  07/23/2019 FINDINGS: The heart is enlarged but appears stable. Progressive central vascular congestion and probable mild interstitial edema. No pleural effusions or focal infiltrates. The bony thorax is intact. IMPRESSION: Cardiac enlargement with progressive vascular congestion and probable mild interstitial edema. Electronically Signed   By: Marijo Sanes M.D.   On: 07/25/2019 05:08   DG Chest Portable 1 View  Result Date: 07/23/2019 CLINICAL DATA:  Wheezing. Abdominal pain since 3 a.m. today with a single episode of vomiting. EXAM: PORTABLE CHEST 1 VIEW COMPARISON:  06/23/2019 FINDINGS: Stable enlarged heart. Breathing motion blurring with a suggestion of small areas of patchy density in both lungs. No pleural fluid. Unremarkable bones. The patient's chin is obscuring medial lung apices. IMPRESSION: 1. Stable cardiomegaly. 2. Possible small areas of pneumonia in both lungs, difficult to assess due to breathing motion blurring. Electronically Signed   By: Claudie Revering M.D.   On: 07/23/2019 14:14    Assessment and Plan:   1.  Newly documented and persistent atrial fibrillation.   CHA2DS2-VASc score is 8.  She has been started on Eliquis as well as divided dose, short acting diltiazem by primary team.  Heart rate control is adequate at this point.  Intermittently documented pauses do not appear to be clinically significant, generally less than 3 seconds.  2.  Acute on chronic renal failure, creatinine up to 2.14 with CKD stage III at baseline.  3.  Chronic diastolic heart failure.  Follow-up echocardiogram in February showed LVEF 60 to 65% with moderate diastolic dysfunction.  4.  Acute hypercarbic respiratory failure, being managed with intermittent BiPAP, Pulmonary following.  5.  Acute pancreatitis, etiology not entirely clear.  Improving symptomatically with downward trending lipase.  She remains n.p.o.  I reviewed telemetry, would continue with current dose of short acting Cardizem and observation.  Eliquis is being dosed correctly at 2.5 mg twice daily in light of chronic renal function and age.  Signed, Rozann Lesches, MD  07/25/2019 3:29 PM

## 2019-07-25 NOTE — Consult Note (Addendum)
PULMONARY / CRITICAL CARE MEDICINE   NAME:  [name], MRN:  [id number], DOB:  02/01/1935, LOS: 2 ADMISSION DATE:  07/23/2019, CONSULTATION DATE:  3/9 REFERRING MD:  [facility], CHIEF COMPLAINT:  resp distress  BRIEF HISTORY:     46 yobf never smoker admit 3/7 with acute pancreatitis ? Etiology with new AF and rx with eliquis/fluids /cardizm with progressive hypersomnolence and found to be hypercarbid am 3/9 and moved to ICU for bipap and pccm service asked to see.    HISTORY OF PRESENT ILLNESS   4 yobf with HTN, DM, CKD 3B, dCHF , chronic LBB and chronic lower extremity edema who presented to the ED 3/7  with epigastric pain associated with nausea and vomiting and lipase > 2200 ? Etiology and acute renal insufficiency /rapid afib   Was moved to icu for hypersomnolence / hypercabia and improved and requested bipap off/ denies ongoing nausea chest/abd pain or sob.   SIGNIFICANT PAST MEDICAL HISTORY    . Arthritis   . Chronic diastolic heart failure ([place])   . CKD (chronic kidney disease)   . CVA (cerebral infarction)   . Diabetes mellitus without complication ([place])   . GERD (gastroesophageal reflux disease)   . Hemorrhoids   . Hypertension   . Stroke [facility])      SIGNIFICANT EVENTS:  3/9 transferred to ICU for Bipap due to hypercarbia  STUDIES:   Cta Chest 3/7  :1. No evidence of acute pulmonary artery filling defect to suggest pulmonary embolism. 2. Features most suggestive of likely cardiogenic pulmonary edema with small bilateral effusions and reflux of contrast into the hepatic veins which can suggest some right-sided heart dysfunction or elevated right heart pressures. 3. Borderline enlarged low-attenuation mediastinal and hilar nodes, likely reactive/edematous. CTabd 3/7 1. Mild diffuse peripancreatic inflammation compatible with acute pancreatitis particularly given the setting of a elevated lipase. No evidence of pancreatic necrosis, significant free fluid or  organized pancreatic collections. Paraduodenal inflammation is likely reactive. 2. Mild pancreatic ductal dilatation, can be related to the acute pancreatic process. Attention on follow-up imaging is recommended. If persistently dilated, MRCP could be obtained.   CULTURES:  UC 3/7  100k e coli Covid 19 pcr  3/7 neg  MRSA PCR 3/8 neg    ANTIBIOTICS:   Rocephin 3/7 >>>  LINES/TUBES:     CONSULTANTS:  PCCM 3/9   Scheduled Meds: . apixaban  2.5 mg Oral BID  . Chlorhexidine Gluconate Cloth  6 each Topical Daily  . diltiazem  30 mg Oral Q6H  . gabapentin  300 mg Oral BID  . hydrALAZINE  50 mg Oral BID  . insulin aspart  0-5 Units Subcutaneous QHS  . insulin aspart  0-6 Units Subcutaneous TID WC  . insulin detemir  15 Units Subcutaneous NOW  . insulin detemir  60 Units Subcutaneous QHS  . pantoprazole (PROTONIX) IV  40 mg Intravenous Q24H  . sodium chloride flush  3 mL Intravenous Q12H   Continuous Infusions: . sodium chloride 100 mL/hr at 07/25/19 0846  . sodium chloride    . cefTRIAXone (ROCEPHIN)  IV 1 g (07/24/19 1732)   PRN Meds:.sodium chloride, acetaminophen **OR** acetaminophen, labetalol, levalbuterol, morphine injection, polyethylene glycol, promethazine, sodium chloride flush, traZODone   SUBJECTIVE:  Denies cp/ abd pain/ sob on bipap, wants off   CONSTITUTIONAL: BP (!) 142/119   Pulse 78   Temp 97.6 F (36.4 C) (Axillary)   Resp 14   Ht 5\' 4"  (1.626 m)   Wt 108.5 kg  SpO2 97%   BMI 41.06 kg/m    Intake/Output Summary (Last 24 hours) at 07/25/2019 1359 Last data filed at 07/25/2019 0900 Gross per 24 hour  Intake 3293.02 ml  Output --  Net 3293.02 ml          PHYSICAL EXAM: General:  Obese elderly bf nad Neuro:  Alert/ approp HEENT:  bipap face mask in place  Cardiovascular:  slt irreg pulse, afib with BBB on monitor  Lungs:  Distant bs bilaterally Abdomen:  Mod distended, relatively soft, mildly diffusely tender s reboudn Musculoskeletal:   Warm s CC or E  Skin:  No rash/ breakdown    pCXR  3/9 reviewed: Cardiac enlargement with progressive vascular congestion and probable mild interstitial edema.     ASSESSMENT AND PLAN   1) acute hypoxemic/hypercarbic resp failure in never smoker with pancreatitis with prior use of opioids now held. - prob element of bilateral "sympathetic" pleural effusion but no need to tap - prob can come off bipap at this point   2) Atrial fib with diastolic dysfunction on cardizem/doac - risk more edema with fluids and agree pancreatitis better so no need to hydrate further at this point  3) Acute pancreatitis ? Etiology s/p remote cholecystectomy s obstruction apparent  ? Related to meds/ dm- note triglycerides nl   4) AKI s/p IV contrast/ 3rd spacing due to sirs from pancreatits Lab Results  Component Value Date   CREATININE 2.14 (H) 07/25/2019   CREATININE 2.03 (H) 07/24/2019   CREATININE 1.54 (H) 07/23/2019   monitor uop/ avoid nephrotoxins  5) E coli UTI - aready on approp abx         LABS  Glucose Recent Labs  Lab 07/24/19 0747 07/24/19 1058 07/24/19 1628 07/24/19 2102 07/25/19 0738 07/25/19 1124  GLUCAP 395* 372* 344* 323* 305* 285*    BMET Recent Labs  Lab 07/23/19 1321 07/24/19 0419 07/25/19 0444  NA 141 139 139  K 4.1 4.9 4.9  CL 106 105 107  CO2 27 23 24   BUN 32* 40* 58*  CREATININE 1.54* 2.03* 2.14*  GLUCOSE 203* 386* 340*    Liver Enzymes Recent Labs  Lab 07/23/19 1321 07/24/19 0419 07/25/19 0444  AST 17 16 23   ALT 16 16 27   ALKPHOS 55 46 44  BILITOT 0.6 0.5 0.4  ALBUMIN 3.6 3.2* 3.0*    Electrolytes Recent Labs  Lab 07/23/19 1321 07/23/19 1526 07/24/19 0419 07/25/19 0444  CALCIUM 8.7*  --  8.1* 7.8*  MG  --  2.2  --   --     CBC Recent Labs  Lab 07/23/19 1321 07/24/19 0419 07/25/19 0444  WBC 11.9* 8.8 10.7*  HGB 11.1* 10.0* 9.5*  HCT 36.4 33.5* 31.4*  PLT 247 204 218    ABG Recent Labs  Lab 07/24/19 1655  07/25/19 0620  PHART 7.282* 7.310*  PCO2ART 51.4* 51.6*  PO2ART 99.8 87.1    Coag's No results for input(s): APTT, INR in the last 168 hours.  Sepsis Markers No results for input(s): LATICACIDVEN, PROCALCITON, O2SATVEN in the last 168 hours.  Cardiac Enzymes No results for input(s): TROPONINI, PROBNP in the last 168 hours.  PAST MEDICAL HISTORY :   She  has a past medical history of Arthritis, Chronic diastolic heart failure ([place]), CKD (chronic kidney disease), CVA (cerebral infarction), Diabetes mellitus without complication ([place]), GERD (gastroesophageal reflux disease), Hemorrhoids, Hypertension, and Stroke ([place]).  PAST SURGICAL HISTORY:  She  has a past surgical history that includes Cholecystectomy; Back  surgery; Joint replacement; Cataract extraction w/PHACO (Left, 02/28/2013); Colonoscopy (12/12/2003); Esophagogastroduodenoscopy (12/28/2001); Hip fracture surgery (2008); Colonoscopy (N/A, 03/21/2014); Esophagogastroduodenoscopy (N/A, 03/21/2014); Savory dilation (N/A, 03/21/2014); maloney dilation (N/A, 03/21/2014); and Eye surgery.  No Known Allergies  Current Facility-Administered Medications on File Prior to Encounter  Medication  . regadenoson (LEXISCAN) injection SOLN 0.4 mg   Current Outpatient Medications on File Prior to Encounter  Medication Sig  . fluticasone (FLONASE) 50 MCG/ACT nasal spray Place 1 spray into both nostrils daily.  Marland Kitchen gabapentin (NEURONTIN) 300 MG capsule Take 300 mg by mouth 2 (two) times daily.   . hydrALAZINE (APRESOLINE) 50 MG tablet Take 1.5 tablets (75 mg total) by mouth 2 (two) times daily.  . hydrocortisone (ANUSOL-HC) 2.5 % rectal cream Place 1 application rectally 2 (two) times daily.  . insulin aspart (NOVOLOG) 100 UNIT/ML injection Inject 30 Units into the skin 3 (three) times daily before meals.   . insulin detemir (LEVEMIR) 100 UNIT/ML injection Inject 80 Units into the skin at bedtime.   . pantoprazole (PROTONIX) 40 MG tablet TAKE 1  TABLET BY MOUTH ONCE DAILY. (Patient taking differently: Take 40 mg by mouth daily. )  . potassium chloride SA (KLOR-CON) 20 MEQ tablet Take 1 tablet (20 mEq total) by mouth daily.  . simvastatin (ZOCOR) 20 MG tablet Take 20 mg by mouth daily.  Marland Kitchen torsemide (DEMADEX) 20 MG tablet Take 20 mg by mouth daily.    FAMILY HISTORY:   Her family history includes Crohn's disease in her daughter; Diabetes in her brother, sister, and sister; Heart attack in her sister; Hypertension in her mother and sister. There is no history of Colon cancer.  SOCIAL HISTORY:  She  reports that she has never smoked. She has never used smokeless tobacco. She reports that she does not drink alcohol or use drugs.      The patient is critically ill with multiple organ systems failure and requires high complexity decision making for assessment and support, frequent evaluation and titration of therapies, application of advanced monitoring technologies and extensive interpretation of multiple databases. Critical Care Time devoted to patient care services described in this note is 45 minutes.    Christinia Gully, MD Pulmonary and [place] [phone] After 6:00 PM or weekends, use [facility] [phone]  After 7pm call Elink  [phone]

## 2019-07-25 NOTE — Progress Notes (Addendum)
PROGRESS NOTE    [name]  UXY:[id number] DOB: Mar 17, 1935 DOA: 07/23/2019 PCP: [name], MD    Brief Narrative:  85 year old female with a history of hypertension, diabetes, chronic kidney disease stage III, diastolic heart failure, admitted to the hospital with acute pancreatitis.  She was also noted to have new onset atrial fibrillation.  Started on IV hydration.  Started on Cardizem for rate control and Eliquis for anticoagulation.  She developed acute kidney injury on chronic kidney disease.  Shortly after admission, she was noted to be increasingly somnolent.  Blood gas was checked and she was noted to to have increased pCO2.  She was transferred to stepdown unit and started on BiPAP.   Assessment & Plan:   Principal Problem:   Pancreatitis, acute Active Problems:   Uncontrolled type 2 diabetes mellitus with diabetic nephropathy, with long-term current use of insulin (HCC)   CKD (chronic kidney disease), stage IIIb   Essential hypertension   Diabetes mellitus type 2 with complications (HCC)   New onset a-fib (HCC)   Chronic diastolic CHF (congestive heart failure) (HCC)/EF > 60 %   Acute respiratory failure with hypercapnia (HCC)   Acute pancreatitis.  She is status post cholecystectomy. No calcified intraductal gallstones identified on imaging.  No history of alcohol use.  She has been hydrated with IV fluids and clinically improving.  Lipase is trending down and she feels that abdominal pain is improving.  If she is able to come off BiPAP, will start on clear liquids. New onset atrial fibrillation.  CHADSVASc of 8.  She is currently on oral diltiazem for rate control.  She has been started on anticoagulation with Eliquis.  Heart rate is currently stable UTI.  Currently on Rocephin.  Urine culture positive for E. coli.  Follow-up sensitivities. Acute respiratory failure with hypercarbia.  Patient was initially noted to be somnolent, blood gas showed elevated PCO2.  Patient  was transferred to the stepdown unit and started on BiPAP.  Since transfer, PCO2 remains in the 50s with mild improvement in pH.  She is still somnolent. Requesting pulmonology input. Acute on chronic diastolic congestive heart failure.  Patient has significant right-sided heart failure with peripheral edema.  This does make management challenging since she also has acute pancreatitis and needs further IV fluids.  Since overall pancreatitis appears clinically improving, will discontinue further IV fluids. Acute kidney injury on chronic kidney disease stage III.  Baseline creatinine 1.4.  Creatinine initially bumped up since admission.  Possibly related to acute pancreatitis.  She also did receive IV contrast on admission.  With ongoing concerns for fluid overload, will hold off on further IV.  Creatinine may be plateauing.  Continue to follow urine output. Diabetes.  She is chronically on high-dose Levemir.  This dose has been reduced since she is n.p.o.  Blood sugars are running high.  Continue to titrate insulin. Hypertension.  Restart hydralazine.  She is also on Cardizem. Abnormal imaging findings. Imaging findings suggest thyroid nodule and possible --- outpatient follow-up with PCP for further investigation/work-up strongly advised Morbid obesity.  BMI 41.  Would benefit from weight loss.   DVT prophylaxis: Eliquis Code Status: Full code Family Communication: Updated daughter, [name] over the phone 3/9 Disposition Plan: Currently on bipap in stepdown unit.  The patient is originally from home.  We will plan on discharge home once abdominal pain from pancreatitis has improved and she is able to take p.o., as well as improvement of her respiratory status.   Consultants:  Procedures:    Antimicrobials:  Ceftriaxone 3/7 >   Subjective: Currently on bipap. She appears somnolent, but wakes up to voice. Feels that abdominal pain is better today as compared to yesterday. No further  vomiting.  Objective: Vitals:   07/25/19 0800 07/25/19 0806 07/25/19 0835 07/25/19 0900  BP: 124/62   (!) 120/56  Pulse: (!) 44 65 77 (!) 59  Resp: 14 13 13 14   Temp:  98 F (36.7 C)    TempSrc:  Axillary    SpO2: 97% 97% 97% 97%  Weight:      Height:        Intake/Output Summary (Last 24 hours) at 07/25/2019 1125 Last data filed at 07/25/2019 0900 Gross per 24 hour  Intake 3293.02 ml  Output 300 ml  Net 2993.02 ml   Filed Weights   07/23/19 1311 07/23/19 2017 07/24/19 2056  Weight: 107 kg 105 kg 108.5 kg    Examination:  General exam: somnolent but wakes up to voice Respiratory system: crackles at bases, currently on bipap. Cardiovascular system:irregular. No murmurs, rubs, gallops. Gastrointestinal system: Abdomen is nondistended, soft and diffusely tender. No organomegaly or masses felt. Normal bowel sounds heard. Central nervous system: No focal neurological deficits. Extremities: 1+ edema bilaterally Skin: No rashes, lesions or ulcers Psychiatry: unable to assess due to bipap   Data Reviewed: I have personally reviewed following labs and imaging studies  CBC: Recent Labs  Lab 07/23/19 1321 07/24/19 0419 07/25/19 0444  WBC 11.9* 8.8 10.7*  NEUTROABS 9.7*  --   --   HGB 11.1* 10.0* 9.5*  HCT 36.4 33.5* 31.4*  MCV 91.7 93.3 92.4  PLT 247 204 315   Basic Metabolic Panel: Recent Labs  Lab 07/23/19 1321 07/23/19 1526 07/24/19 0419 07/25/19 0444  NA 141  --  139 139  K 4.1  --  4.9 4.9  CL 106  --  105 107  CO2 27  --  23 24  GLUCOSE 203*  --  386* 340*  BUN 32*  --  40* 58*  CREATININE 1.54*  --  2.03* 2.14*  CALCIUM 8.7*  --  8.1* 7.8*  MG  --  2.2  --   --    GFR: Estimated Creatinine Clearance: 24 mL/min (A) (by C-G formula based on SCr of 2.14 mg/dL (H)). Liver Function Tests: Recent Labs  Lab 07/23/19 1321 07/24/19 0419 07/25/19 0444  AST 17 16 23   ALT 16 16 27   ALKPHOS 55 46 44  BILITOT 0.6 0.5 0.4  PROT 8.0 7.2 7.0  ALBUMIN 3.6  3.2* 3.0*   Recent Labs  Lab 07/23/19 1321 07/24/19 0419 07/25/19 0444  LIPASE 2,264* 1,489* 150*   No results for input(s): AMMONIA in the last 168 hours. Coagulation Profile: No results for input(s): INR, PROTIME in the last 168 hours. Cardiac Enzymes: No results for input(s): CKTOTAL, CKMB, CKMBINDEX, TROPONINI in the last 168 hours. BNP (last 3 results) No results for input(s): PROBNP in the last 8760 hours. HbA1C: Recent Labs    07/23/19 1526  HGBA1C 8.0*   CBG: Recent Labs  Lab 07/24/19 0747 07/24/19 1058 07/24/19 1628 07/24/19 2102 07/25/19 0738  GLUCAP 395* 372* 344* 323* 305*   Lipid Profile: Recent Labs    07/24/19 0419  CHOL 118  HDL 38*  LDLCALC 73  TRIG 35  CHOLHDL 3.1   Thyroid Function Tests: Recent Labs    07/24/19 0419  TSH 0.420   Anemia Panel: No results for input(s): VITAMINB12, FOLATE, FERRITIN,  TIBC, IRON, RETICCTPCT in the last 72 hours. Sepsis Labs: No results for input(s): PROCALCITON, LATICACIDVEN in the last 168 hours.  Recent Results (from the past 240 hour(s))  SARS CORONAVIRUS 2 (TAT 6-24 HRS) Nasopharyngeal Nasopharyngeal Swab     Status: None   Collection Time: 07/23/19  1:32 PM   Specimen: Nasopharyngeal Swab  Result Value Ref Range Status   SARS Coronavirus 2 NEGATIVE NEGATIVE Final    Comment: (NOTE) SARS-CoV-2 target nucleic acids are NOT DETECTED. The SARS-CoV-2 RNA is generally detectable in upper and lower respiratory specimens during the acute phase of infection. Negative results do not preclude SARS-CoV-2 infection, do not rule out co-infections with other pathogens, and should not be used as the sole basis for treatment or other patient management decisions. Negative results must be combined with clinical observations, patient history, and epidemiological information. The expected result is Negative. Fact Sheet for Patients: SugarRoll.be Fact Sheet for Healthcare  Providers: https://www.woods-mathews.com/ This test is not yet approved or cleared by the [place] FDA and  has been authorized for detection and/or diagnosis of SARS-CoV-2 by FDA under an Emergency Use Authorization (EUA). This EUA will remain  in effect (meaning this test can be used) for the duration of the COVID-19 declaration under Section 56 4(b)(1) of the Act, 21 U.S.C. section 360bbb-3(b)(1), unless the authorization is terminated or revoked sooner. Performed at [facility], [place] [address], [place], [place] [address]   Urine culture     Status: Abnormal (Preliminary result)   Collection Time: 07/23/19  2:26 PM   Specimen: Urine, Clean Catch  Result Value Ref Range Status   Specimen Description   Final    URINE, CLEAN CATCH Performed at [facility], [address], [place], [place] [address]    Special Requests   Final    NONE Performed at [facility], [address], [place], [place] [address]    Culture (A)  Final    >=100,000 COLONIES/mL ESCHERICHIA COLI SUSCEPTIBILITIES TO FOLLOW Performed at [facility] [address], [place], [place] [address]    Report Status PENDING  Incomplete  MRSA PCR Screening     Status: None   Collection Time: 07/24/19  8:44 PM   Specimen: Nasal Mucosa; Nasopharyngeal  Result Value Ref Range Status   MRSA by PCR NEGATIVE NEGATIVE Final    Comment:        The GeneXpert MRSA Assay (FDA approved for NASAL specimens only), is one component of a comprehensive MRSA colonization surveillance program. It is not intended to diagnose MRSA infection nor to guide or monitor treatment for MRSA infections. Performed at [facility], [address], [place], [place] [address]          Radiology Studies: CT Angio Chest PE W and/or Wo Contrast  Result Date: 07/23/2019 CLINICAL DATA:  Shortness of breath, chest pain, abdominal pain began at 03:00 EXAM: CT ANGIOGRAPHY CHEST CT ABDOMEN AND PELVIS WITH CONTRAST  TECHNIQUE: Multidetector CT imaging of the chest was performed using the standard protocol during bolus administration of intravenous contrast. Multiplanar CT image reconstructions and MIPs were obtained to evaluate the vascular anatomy. Multidetector CT imaging of the abdomen and pelvis was performed using the standard protocol during bolus administration of intravenous contrast. CONTRAST:  64mL OMNIPAQUE IOHEXOL 350 MG/ML SOLN COMPARISON:  Chest radiograph 05/20/7251, CT renal colic 66/44/0347, CT abdomen pelvis 05/06/2018 FINDINGS: CTA CHEST FINDINGS Cardiovascular: Satisfactory opacification the pulmonary arteries to the segmental level. No pulmonary artery filling defects  are identified. Central pulmonary arteries are normal caliber. Atherosclerotic plaque within the normal caliber aorta. No intramural hematoma, dissection flap or other acute luminal abnormality of the aorta is seen. No periaortic stranding or hemorrhage. Atherosclerotic calcification of the coronary arteries. Cardiac size at the upper limits of normal. Small pericardial effusion and fluid within the pericardial recesses. Reflux of contrast into the IVC and mildly within the hepatic veins. Remaining major venous structures are unremarkable. Mediastinum/Nodes: Heterogeneous enlarged and nodular thyroid gland. Dominant nodule in the right lobe measures up to 2.5 cm (2/12) low-attenuation fluid is seen within the pericardial recesses. Few borderline enlarged low-attenuation mediastinal and hilar nodes are present instance a 10 mm left hilar node (2/86), and an 11 mm right hilar node (2/34), and a 16 mm subcarinal node (2/39). No acute abnormality of the trachea or esophagus. No worrisome axillary or visible supraclavicular adenopathy. Lungs/Pleura: Small bilateral effusions. Adjacent areas of passive atelectasis. Interlobular septal thickening noted towards the apices and lung bases. Redistributed pulmonary vascularity. Central airways  thickening/cuffing. Some more patchy ground-glass towards the lung bases, favor edema. Musculoskeletal: Diffusely thickened cortex and trabecular coarsening of the left tenth rib with mild expansile changes. Mild dextrocurvature of the thoracic spine. Multilevel degenerative changes are present in the imaged portions of the spine. Additional degenerative changes in the shoulders. Review of the MIP images confirms the above findings. CT ABDOMEN and PELVIS FINDINGS Hepatobiliary: No focal liver abnormality is seen. Patient is post cholecystectomy. Slight prominence of the biliary tree likely related to reservoir effect. No calcified intraductal gallstones. Pancreas: There is slight prominence of the pancreatic duct measuring up to 5 mm the head and 3 mm at the tail/body junction. Uniform pancreatic enhancement with mild diffuse peripancreatic inflammation most focally upon the pancreatic head and uncinate process. No organized or encapsulated fluid collections are seen. Spleen: Normal in size without focal abnormality. Adrenals/Urinary Tract: Mild nodular thickening of the adrenal glands, similar to priors. Intermediate attenuation 1.5 cm cystic lesion in the anterior interpolar left kidney. Additional subcentimeter hypoattenuating renal lesions both kidneys. Renal vascular calcium is noted. Suspect few nonobstructing calculi in the collecting system as well. No obstructive urolithiasis or hydronephrosis. Urinary bladder is unremarkable. Stomach/Bowel: Distal esophagus and stomach are unremarkable. Mild periduodenal inflammatory changes of the first through third portions of the duodenum. Favor reactive in the absence of wall thickening or dilatation. No distal small bowel abnormality is seen. No evidence of obstruction. A normal appendix is visualized. No colonic dilatation or wall thickening. Scattered colonic diverticula without focal pericolonic inflammation to suggest diverticulitis. Vascular/Lymphatic:  Atherosclerotic plaque within the normal caliber aorta and branch vessels. No suspicious or enlarged lymph nodes in the included lymphatic chains. Reproductive: Anteverted uterus. No concerning adnexal lesions. Other: Mild ventral diastasis with a superimposed fat containing umbilical hernia. No bowel containing hernias. Soft tissue thickening along the anterior abdominal wall of the appearance most compatible with localized injectable use related to insulin. Musculoskeletal: Multilevel moderate degenerative changes of the imaged thoracolumbar spine. More moderate to severe degenerative changes in the SI joints and symphysis pubis. Prior right femoral lateral plate and screw construct with transcervical pending. Some articular surface collapse near the terminus of the transcervical fixation screw in the superior right femoral head may reflect some articular surface collapse or avascular necrosis. Review of the MIP images confirms the above findings. IMPRESSION: CTA CHEST 1. No evidence of acute pulmonary artery filling defect to suggest pulmonary embolism. 2. Features most suggestive of likely cardiogenic pulmonary edema with  small bilateral effusions and reflux of contrast into the hepatic veins which can suggest some right-sided heart dysfunction or elevated right heart pressures. 3. Borderline enlarged low-attenuation mediastinal and hilar nodes, likely reactive/edematous. 4. Heterogeneous enlarged and nodular thyroid gland. Dominant right thyroid nodule measures 2.5 cm. Recommend further evaluation with nonemergent thyroid ultrasound. This follows consensus guidelines: Managing Incidental Thyroid Nodules Detected on Imaging: White Paper of the ACR Incidental Thyroid Findings Committee. J Am Coll Radiol 2015; 12:143-150. and [facility] 3-tiered system for managing ITNs: J Am Coll Radiol. 2015; Feb;12(2): 143-50 5. Diffusely thickened cortex and trabecular coarsening of the left tenth rib with mild expansile changes.  This finding is favored to represent Paget's disease, less likely Paget's disease. CT ABDOMEN AND PELVIS 1. Mild diffuse peripancreatic inflammation compatible with acute pancreatitis particularly given the setting of a elevated lipase. No evidence of pancreatic necrosis, significant free fluid or organized pancreatic collections. Paraduodenal inflammation is likely reactive. 2. Mild pancreatic ductal dilatation, can be related to the acute pancreatic process. Attention on follow-up imaging is recommended. If persistently dilated, MRCP could be obtained. 3. Indeterminate intermediate attenuation 1.5 cm cyst in the left kidney, consider outpatient, nonemergent renal ultrasound. 4. Prior right femoral transcervical fixation with some sclerotic changes adjacent the tip of the transcervical fixation screw extending to the articular surface of the femoral head. May reflect avascular necrosis. 5. Ventral diastasis with a superimposed fat containing umbilical hernia. 6.  Aortic Atherosclerosis (ICD10-I70.0). These results were called by telephone at the time of interpretation on 07/23/2019 at 5:29 pm to provider [facility] , who verbally acknowledged these results. Electronically Signed   By: Lovena Le M.D.   On: 07/23/2019 17:29   CT ABDOMEN PELVIS W CONTRAST  Result Date: 07/23/2019 CLINICAL DATA:  Shortness of breath, chest pain, abdominal pain began at 03:00 EXAM: CT ANGIOGRAPHY CHEST CT ABDOMEN AND PELVIS WITH CONTRAST TECHNIQUE: Multidetector CT imaging of the chest was performed using the standard protocol during bolus administration of intravenous contrast. Multiplanar CT image reconstructions and MIPs were obtained to evaluate the vascular anatomy. Multidetector CT imaging of the abdomen and pelvis was performed using the standard protocol during bolus administration of intravenous contrast. CONTRAST:  75mL OMNIPAQUE IOHEXOL 350 MG/ML SOLN COMPARISON:  Chest radiograph 00/76/2263, CT renal colic 33/54/5625, CT  abdomen pelvis 05/06/2018 FINDINGS: CTA CHEST FINDINGS Cardiovascular: Satisfactory opacification the pulmonary arteries to the segmental level. No pulmonary artery filling defects are identified. Central pulmonary arteries are normal caliber. Atherosclerotic plaque within the normal caliber aorta. No intramural hematoma, dissection flap or other acute luminal abnormality of the aorta is seen. No periaortic stranding or hemorrhage. Atherosclerotic calcification of the coronary arteries. Cardiac size at the upper limits of normal. Small pericardial effusion and fluid within the pericardial recesses. Reflux of contrast into the IVC and mildly within the hepatic veins. Remaining major venous structures are unremarkable. Mediastinum/Nodes: Heterogeneous enlarged and nodular thyroid gland. Dominant nodule in the right lobe measures up to 2.5 cm (2/12) low-attenuation fluid is seen within the pericardial recesses. Few borderline enlarged low-attenuation mediastinal and hilar nodes are present instance a 10 mm left hilar node (2/86), and an 11 mm right hilar node (2/34), and a 16 mm subcarinal node (2/39). No acute abnormality of the trachea or esophagus. No worrisome axillary or visible supraclavicular adenopathy. Lungs/Pleura: Small bilateral effusions. Adjacent areas of passive atelectasis. Interlobular septal thickening noted towards the apices and lung bases. Redistributed pulmonary vascularity. Central airways thickening/cuffing. Some more patchy ground-glass towards the lung bases,  favor edema. Musculoskeletal: Diffusely thickened cortex and trabecular coarsening of the left tenth rib with mild expansile changes. Mild dextrocurvature of the thoracic spine. Multilevel degenerative changes are present in the imaged portions of the spine. Additional degenerative changes in the shoulders. Review of the MIP images confirms the above findings. CT ABDOMEN and PELVIS FINDINGS Hepatobiliary: No focal liver abnormality is  seen. Patient is post cholecystectomy. Slight prominence of the biliary tree likely related to reservoir effect. No calcified intraductal gallstones. Pancreas: There is slight prominence of the pancreatic duct measuring up to 5 mm the head and 3 mm at the tail/body junction. Uniform pancreatic enhancement with mild diffuse peripancreatic inflammation most focally upon the pancreatic head and uncinate process. No organized or encapsulated fluid collections are seen. Spleen: Normal in size without focal abnormality. Adrenals/Urinary Tract: Mild nodular thickening of the adrenal glands, similar to priors. Intermediate attenuation 1.5 cm cystic lesion in the anterior interpolar left kidney. Additional subcentimeter hypoattenuating renal lesions both kidneys. Renal vascular calcium is noted. Suspect few nonobstructing calculi in the collecting system as well. No obstructive urolithiasis or hydronephrosis. Urinary bladder is unremarkable. Stomach/Bowel: Distal esophagus and stomach are unremarkable. Mild periduodenal inflammatory changes of the first through third portions of the duodenum. Favor reactive in the absence of wall thickening or dilatation. No distal small bowel abnormality is seen. No evidence of obstruction. A normal appendix is visualized. No colonic dilatation or wall thickening. Scattered colonic diverticula without focal pericolonic inflammation to suggest diverticulitis. Vascular/Lymphatic: Atherosclerotic plaque within the normal caliber aorta and branch vessels. No suspicious or enlarged lymph nodes in the included lymphatic chains. Reproductive: Anteverted uterus. No concerning adnexal lesions. Other: Mild ventral diastasis with a superimposed fat containing umbilical hernia. No bowel containing hernias. Soft tissue thickening along the anterior abdominal wall of the appearance most compatible with localized injectable use related to insulin. Musculoskeletal: Multilevel moderate degenerative changes  of the imaged thoracolumbar spine. More moderate to severe degenerative changes in the SI joints and symphysis pubis. Prior right femoral lateral plate and screw construct with transcervical pending. Some articular surface collapse near the terminus of the transcervical fixation screw in the superior right femoral head may reflect some articular surface collapse or avascular necrosis. Review of the MIP images confirms the above findings. IMPRESSION: CTA CHEST 1. No evidence of acute pulmonary artery filling defect to suggest pulmonary embolism. 2. Features most suggestive of likely cardiogenic pulmonary edema with small bilateral effusions and reflux of contrast into the hepatic veins which can suggest some right-sided heart dysfunction or elevated right heart pressures. 3. Borderline enlarged low-attenuation mediastinal and hilar nodes, likely reactive/edematous. 4. Heterogeneous enlarged and nodular thyroid gland. Dominant right thyroid nodule measures 2.5 cm. Recommend further evaluation with nonemergent thyroid ultrasound. This follows consensus guidelines: Managing Incidental Thyroid Nodules Detected on Imaging: White Paper of the ACR Incidental Thyroid Findings Committee. J Am Coll Radiol 2015; 12:143-150. and [facility] 3-tiered system for managing ITNs: J Am Coll Radiol. 2015; Feb;12(2): 143-50 5. Diffusely thickened cortex and trabecular coarsening of the left tenth rib with mild expansile changes. This finding is favored to represent Paget's disease, less likely Paget's disease. CT ABDOMEN AND PELVIS 1. Mild diffuse peripancreatic inflammation compatible with acute pancreatitis particularly given the setting of a elevated lipase. No evidence of pancreatic necrosis, significant free fluid or organized pancreatic collections. Paraduodenal inflammation is likely reactive. 2. Mild pancreatic ductal dilatation, can be related to the acute pancreatic process. Attention on follow-up imaging is recommended. If  persistently dilated, MRCP  could be obtained. 3. Indeterminate intermediate attenuation 1.5 cm cyst in the left kidney, consider outpatient, nonemergent renal ultrasound. 4. Prior right femoral transcervical fixation with some sclerotic changes adjacent the tip of the transcervical fixation screw extending to the articular surface of the femoral head. May reflect avascular necrosis. 5. Ventral diastasis with a superimposed fat containing umbilical hernia. 6.  Aortic Atherosclerosis (ICD10-I70.0). These results were called by telephone at the time of interpretation on 07/23/2019 at 5:29 pm to provider [facility] , who verbally acknowledged these results. Electronically Signed   By: Lovena Le M.D.   On: 07/23/2019 17:29   DG CHEST PORT 1 VIEW  Result Date: 07/25/2019 CLINICAL DATA:  Shortness of breath. EXAM: PORTABLE CHEST 1 VIEW COMPARISON:  07/23/2019 FINDINGS: The heart is enlarged but appears stable. Progressive central vascular congestion and probable mild interstitial edema. No pleural effusions or focal infiltrates. The bony thorax is intact. IMPRESSION: Cardiac enlargement with progressive vascular congestion and probable mild interstitial edema. Electronically Signed   By: Marijo Sanes M.D.   On: 07/25/2019 05:08   DG Chest Portable 1 View  Result Date: 07/23/2019 CLINICAL DATA:  Wheezing. Abdominal pain since 3 a.m. today with a single episode of vomiting. EXAM: PORTABLE CHEST 1 VIEW COMPARISON:  06/23/2019 FINDINGS: Stable enlarged heart. Breathing motion blurring with a suggestion of small areas of patchy density in both lungs. No pleural fluid. Unremarkable bones. The patient's chin is obscuring medial lung apices. IMPRESSION: 1. Stable cardiomegaly. 2. Possible small areas of pneumonia in both lungs, difficult to assess due to breathing motion blurring. Electronically Signed   By: Claudie Revering M.D.   On: 07/23/2019 14:14        Scheduled Meds:  apixaban  2.5 mg Oral BID   Chlorhexidine  Gluconate Cloth  6 each Topical Daily   diltiazem  30 mg Oral Q6H   gabapentin  300 mg Oral BID   hydrALAZINE  50 mg Oral BID   insulin aspart  0-5 Units Subcutaneous QHS   insulin aspart  0-6 Units Subcutaneous TID WC   insulin detemir  15 Units Subcutaneous NOW   insulin detemir  60 Units Subcutaneous QHS   pantoprazole (PROTONIX) IV  40 mg Intravenous Q24H   sodium chloride flush  3 mL Intravenous Q12H   Continuous Infusions:  sodium chloride 100 mL/hr at 07/25/19 0846   sodium chloride     cefTRIAXone (ROCEPHIN)  IV 1 g (07/24/19 1732)     LOS: 2 days    Critical care procedure note Authorized and performed by: Kathie Dike Total critical care time: Approximately 35 minutes Due to high probability of clinically significant, life-threatening deterioration, the patient required my highest level of preparedness to intervene emergently and I personally spent this critical care time directly and personally managing the patient.  The critical care time included obtaining a history, examining the patient, pulse oximetry, ordering and review of studies, arranging urgent treatment with development of a management plan, evaluation of patient's response to treatment, frequent reassessment, discussions with other providers.  Critical care time was performed to assess and manage the high probability of imminent, life-threatening deterioration that could result in multiorgan failure.  It was exclusive of separate billable procedures and treating other patients and teaching time.  Please see MDM section and the rest of the of note for further information on patient assessment and treatment      Kathie Dike, MD [facility]   If 7PM-7AM, please contact night-coverage [email or web address]  07/25/2019, 11:25 AM   Addendum:  Informed by staff that patient had a 6 second pause on telemetry monitoring. Heart rate rebounded into the 90s. Yesterday, she was having episodes of bradycardia. Will  request cardiology input.  [employer]

## 2019-07-26 ENCOUNTER — Encounter: Payer: Medicare HMO | Admitting: Gastroenterology

## 2019-07-26 LAB — GLUCOSE, CAPILLARY
Glucose-Capillary: 132 mg/dL — ABNORMAL HIGH (ref 70–99)
Glucose-Capillary: 139 mg/dL — ABNORMAL HIGH (ref 70–99)
Glucose-Capillary: 194 mg/dL — ABNORMAL HIGH (ref 70–99)
Glucose-Capillary: 214 mg/dL — ABNORMAL HIGH (ref 70–99)

## 2019-07-26 LAB — URINE CULTURE: Culture: >=100000 — AB

## 2019-07-26 MED ORDER — FUROSEMIDE 10 MG/ML IJ SOLN
40.0000 mg | Freq: Once | INTRAMUSCULAR | Status: AC
  Administered 2019-07-26: 40 mg via INTRAVENOUS
  Filled 2019-07-26: qty 4

## 2019-07-26 MED ORDER — LEVOFLOXACIN IN D5W 750 MG/150ML IV SOLN
750.0000 mg | INTRAVENOUS | Status: DC
  Administered 2019-07-26 – 2019-07-30 (×3): 750 mg via INTRAVENOUS
  Filled 2019-07-26 (×4): qty 150

## 2019-07-26 NOTE — Evaluation (Signed)
Physical Therapy Evaluation Patient Details Name: [name] MRN: [id number] DOB: 06-Apr-1935 Today's Date: 07/26/2019   History of Present Illness  [name]  is a 85 y.o. female with past medical history relevant for HTN, DM, CKD 3B, dCHF , chronic LBB and chronic lower extremity edema who presents to the ED with epigastric pain associated with nausea and vomiting    Clinical Impression  Patient demonstrates slow labored movement with difficulty and repeated attempts for supine to sitting at bedside mostly due to c/o severe stomach pain and generalized weakness.  Patient able to partially stand, but cannot lock knees due weakness and limited to standing for 1-2 minutes before having to sit due to severe fall risk.  Patient put back to bed with Max/total assist to reposition - patient requested pain medication and nursing staff notified.  Patient will benefit from continued physical therapy in hospital and recommended venue below to increase strength, balance, endurance for safe ADLs and gait.    Follow Up Recommendations SNF    Equipment Recommendations  None recommended by PT    Recommendations for Other Services       Precautions / Restrictions Precautions Precautions: Fall Restrictions Weight Bearing Restrictions: No      Mobility  Bed Mobility Overal bed mobility: Needs Assistance Bed Mobility: Supine to Sit;Sit to Supine     Supine to sit: Max assist Sit to supine: Max assist   General bed mobility comments: slow labored movement with difficulty supine to sitting due to c/o severe abdominal pain  Transfers Overall transfer level: Needs assistance Equipment used: Rolling walker (2 wheeled) Transfers: Sit to/from Stand Sit to Stand: Max assist         General transfer comment: Max to partial stand using RW  Ambulation/Gait                Stairs            Wheelchair Mobility    Modified Rankin (Stroke Patients Only)       Balance Overall  balance assessment: Needs assistance Sitting-balance support: Feet supported;No upper extremity supported Sitting balance-Leahy Scale: Fair Sitting balance - Comments: seated at EOB   Standing balance support: During functional activity;Bilateral upper extremity supported Standing balance-Leahy Scale: Poor Standing balance comment: partial standing using RW                             Pertinent Vitals/Pain Pain Assessment: Faces Faces Pain Scale: Hurts whole lot Pain Location: stomach Pain Descriptors / Indicators: Aching;Sore;Discomfort;Grimacing Pain Intervention(s): Limited activity within patient's tolerance;Monitored during session;Patient requesting pain meds-RN notified;Repositioned    Home Living Family/patient expects to be discharged to:: Private residence Living Arrangements: Children Available Help at Discharge: Family;Available PRN/intermittently Type of Home: House Home Access: Stairs to enter Entrance Stairs-Rails: Right Entrance Stairs-Number of Steps: 2 Home Layout: One level Home Equipment: Walker - 2 wheels;Bedside commode;Wheelchair - manual;Shower seat      Prior Function Level of Independence: Independent with assistive device(s)         Comments: household ambulator with RW     Hand Dominance   Dominant Hand: Right    Extremity/Trunk Assessment   Upper Extremity Assessment Upper Extremity Assessment: Generalized weakness    Lower Extremity Assessment Lower Extremity Assessment: Generalized weakness    Cervical / Trunk Assessment Cervical / Trunk Assessment: Normal  Communication   Communication: No difficulties  Cognition Arousal/Alertness: Awake/alert Behavior During Therapy: WFL for tasks  assessed/performed Overall Cognitive Status: Within Functional Limits for tasks assessed                                        General Comments      Exercises     Assessment/Plan    PT Assessment Patient needs  continued PT services  PT Problem List Decreased strength;Decreased activity tolerance;Decreased balance;Decreased mobility       PT Treatment Interventions Gait training;Stair training;Functional mobility training;Therapeutic activities;Therapeutic exercise;Patient/family education;Balance training    PT Goals (Current goals can be found in the Care Plan section)  Acute Rehab PT Goals Patient Stated Goal: return home with family to assist after rehab PT Goal Formulation: With patient Time For Goal Achievement: 08/09/19 Potential to Achieve Goals: Good    Frequency Min 3X/week   Barriers to discharge        Co-evaluation               AM-PAC PT "6 Clicks" Mobility  Outcome Measure Help needed turning from your back to your side while in a flat bed without using bedrails?: A Lot Help needed moving from lying on your back to sitting on the side of a flat bed without using bedrails?: A Lot Help needed moving to and from a bed to a chair (including a wheelchair)?: Total Help needed standing up from a chair using your arms (e.g., wheelchair or bedside chair)?: A Lot Help needed to walk in hospital room?: Total Help needed climbing 3-5 steps with a railing? : Total 6 Click Score: 9    End of Session   Activity Tolerance: Patient tolerated treatment well;Patient limited by fatigue;Patient limited by pain Patient left: in bed;with call bell/phone within reach Nurse Communication: Mobility status PT Visit Diagnosis: Unsteadiness on feet (R26.81);Other abnormalities of gait and mobility (R26.89);Muscle weakness (generalized) (M62.81)    Time: 5427-0623 PT Time Calculation (min) (ACUTE ONLY): 31 min   Charges:   PT Evaluation $PT Eval Moderate Complexity: 1 Mod PT Treatments $Therapeutic Activity: 23-37 mins        3:43 PM, 07/26/19 Lonell Grandchild, MPT Physical Therapist with [facility] [phone] office [phone] mobile phone

## 2019-07-26 NOTE — Progress Notes (Signed)
PULMONARY / CRITICAL CARE MEDICINE   NAME:  [name], MRN:  [id number], DOB:  06-Apr-1935, LOS: 3 ADMISSION DATE:  07/23/2019, CONSULTATION DATE:  3/9 REFERRING MD:  [facility], CHIEF COMPLAINT:  resp distress  BRIEF HISTORY:     26 yobf never smoker admit 3/7 with acute pancreatitis ? Etiology with new AF and rx with eliquis/fluids /cardizem with progressive hypersomnolence and found to be hypercarbid am 3/9 and moved to ICU for bipap and pccm service asked to see.    HISTORY OF PRESENT ILLNESS   76 yobf with HTN, DM, CKD 3B, dCHF , chronic LBB and chronic lower extremity edema who presented to the ED 3/7  with epigastric pain associated with nausea and vomiting and lipase > 2200 ? Etiology and acute renal insufficiency /rapid afib   Was moved to icu for hypersomnolence / hypercabia and improved and requested bipap off/ denies ongoing nausea chest/abd pain or sob.   SIGNIFICANT PAST MEDICAL HISTORY    . Arthritis   . Chronic diastolic heart failure ([place])   . CKD (chronic kidney disease)   . CVA (cerebral infarction)   . Diabetes mellitus without complication ([place])   . GERD (gastroesophageal reflux disease)   . Hemorrhoids   . Hypertension   . Stroke [facility])      SIGNIFICANT EVENTS:  3/9 transferred to ICU for Bipap due to hypercarbia  STUDIES:   Echo 06/29/19:  Grade II diastolic dysfunction with severe LAE Cta Chest 3/7  :1. No evidence of acute pulmonary artery filling defect to suggest pulmonary embolism. 2. Features most suggestive of likely cardiogenic pulmonary edema with small bilateral effusions and reflux of contrast into the hepatic veins which can suggest some right-sided heart dysfunction or elevated right heart pressures. 3. Borderline enlarged low-attenuation mediastinal and hilar nodes, likely reactive/edematous. CTabd 3/7 1. Mild diffuse peripancreatic inflammation compatible with acute pancreatitis particularly given the setting of a elevated lipase.  No evidence of pancreatic necrosis, significant free fluid or organized pancreatic collections. Paraduodenal inflammation is likely reactive. 2. Mild pancreatic ductal dilatation, can be related to the acute pancreatic process. Attention on follow-up imaging is recommended. If persistently dilated, MRCP could be obtained.   CULTURES:  UC 3/7  100k e coli intermediate to rocephin  Covid 19 pcr  3/7 neg  MRSA PCR 3/8 neg    ANTIBIOTICS:   Rocephin 3/7 >>>3/10 Levaquin 3/10 >>>  LINES/TUBES:     CONSULTANTS:  PCCM 3/9   Scheduled Meds: . apixaban  2.5 mg Oral BID  . Chlorhexidine Gluconate Cloth  6 each Topical Daily  . diltiazem  30 mg Oral Q6H  . gabapentin  300 mg Oral BID  . hydrALAZINE  50 mg Oral BID  . insulin aspart  0-5 Units Subcutaneous QHS  . insulin aspart  0-6 Units Subcutaneous TID WC  . insulin detemir  60 Units Subcutaneous QHS  . pantoprazole (PROTONIX) IV  40 mg Intravenous Q24H  . sodium chloride flush  3 mL Intravenous Q12H   Continuous Infusions: . sodium chloride 100 mL/hr at 07/26/19 1100  . sodium chloride    . levofloxacin (LEVAQUIN) IV Stopped (07/26/19 1005)   PRN Meds:.sodium chloride, acetaminophen **OR** acetaminophen, labetalol, levalbuterol, polyethylene glycol, promethazine, sodium chloride flush, traZODone   SUBJECTIVE:  abd pain s nausea/ sob on RA  CONSTITUTIONAL: BP 134/83   Pulse 98   Temp 98 F (36.7 C) (Oral)   Resp (!) 25   Ht 5\' 4"  (1.626 m)   Wt 109.7  kg   SpO2 93%   BMI 41.51 kg/m    Intake/Output Summary (Last 24 hours) at 07/26/2019 1442 Last data filed at 07/26/2019 1155 Gross per 24 hour  Intake 2189.34 ml  Output 1850 ml  Net 339.34 ml          PHYSICAL EXAM: General:  Elderly obese bf NA RA Neuro:  Alert/ resp to verbal approp HEENT:  orphx clear Cardiovascular:  slt irreg pulse afib on monitor Lungs:  Decreased bs bilaterally  Abdomen:  Mod distended but min tender s rebound Musculoskeletal:  warm s calf tenderness/ no edme  Skin:  No lesions         ASSESSMENT AND PLAN   1) acute hypoxemic/hypercarbic resp failure in never smoker with pancreatitis with prior use of opioids on hold and looks a lot better today - prob element of bilateral "sympathetic" pleural effusion but no need to tap at present - looks fine off bipap    2) Atrial fib with diastolic dysfunction on cardizem/doac - cards now following   3) Acute pancreatitis ? Etiology s/p remote cholecystectomy s obstruction apparent  - w/u ? GI ? But apears to be resolving s obvious complication  4) AKI s/p IV contrast/ 3rd spacing due to sirs from pancreatits Lab Results  Component Value Date   CREATININE 2.14 (H) 07/25/2019   CREATININE 2.03 (H) 07/24/2019   CREATININE 1.54 (H) 07/23/2019   monitor uop/ avoid nephrotoxins  5) E coli UTI - changed to levaquin by [facility], see above flowsheet        LABS  Glucose Recent Labs  Lab 07/25/19 0738 07/25/19 1124 07/25/19 1642 07/25/19 2125 07/26/19 0748 07/26/19 1154  GLUCAP 305* 285* 235* 174* 132* 139*    BMET Recent Labs  Lab 07/23/19 1321 07/24/19 0419 07/25/19 0444  NA 141 139 139  K 4.1 4.9 4.9  CL 106 105 107  CO2 27 23 24   BUN 32* 40* 58*  CREATININE 1.54* 2.03* 2.14*  GLUCOSE 203* 386* 340*    Liver Enzymes Recent Labs  Lab 07/23/19 1321 07/24/19 0419 07/25/19 0444  AST 17 16 23   ALT 16 16 27   ALKPHOS 55 46 44  BILITOT 0.6 0.5 0.4  ALBUMIN 3.6 3.2* 3.0*    Electrolytes Recent Labs  Lab 07/23/19 1321 07/23/19 1526 07/24/19 0419 07/25/19 0444  CALCIUM 8.7*  --  8.1* 7.8*  MG  --  2.2  --   --     CBC Recent Labs  Lab 07/23/19 1321 07/24/19 0419 07/25/19 0444  WBC 11.9* 8.8 10.7*  HGB 11.1* 10.0* 9.5*  HCT 36.4 33.5* 31.4*  PLT 247 204 218    ABG Recent Labs  Lab 07/24/19 1655 07/25/19 0620  PHART 7.282* 7.310*  PCO2ART 51.4* 51.6*  PO2ART 99.8 87.1    Coag's No results for input(s): APTT, INR  in the last 168 hours.  Sepsis Markers No results for input(s): LATICACIDVEN, PROCALCITON, O2SATVEN in the last 168 hours.  Cardiac Enzymes No results for input(s): TROPONINI, PROBNP in the last 168 hours.          Christinia Gully, MD Pulmonary and [place] [phone] After 6:00 PM or weekends, use [facility] [phone]  After 7pm call Elink  [phone]

## 2019-07-26 NOTE — Progress Notes (Signed)
[name]  LPF:[id number] DOB: 1934-11-25 DOA: 07/23/2019 PCP: [name], MD    Brief Narrative:  85 year old female with a history of hypertension, diabetes, chronic kidney disease stage III, diastolic heart failure, admitted to the hospital with acute pancreatitis.  She was also noted to have new onset atrial fibrillation.  Started on IV hydration.  Started on Cardizem for rate control and Eliquis for anticoagulation.  She developed acute kidney injury on chronic kidney disease.  Shortly after admission, she was noted to be increasingly somnolent.  Blood gas was checked and she was noted to to have increased pCO2.  She was transferred to stepdown unit and started on BiPAP.   Assessment & Plan:   Principal Problem:   Pancreatitis, acute Active Problems:   New onset a-fib ([place])   Uncontrolled type 2 diabetes mellitus with diabetic nephropathy, with long-term current use of insulin (HCC)   Diabetes mellitus type 2 with complications (HCC)   Chronic diastolic CHF (congestive heart failure) (HCC)/EF > 60 %   CKD (chronic kidney disease), stage IIIb   Essential hypertension   Acute respiratory failure with hypercapnia (HCC)   AKI (acute kidney injury) ([place])   1)Acute pancreatitis.  She is status post cholecystectomy. No calcified intraductal gallstones identified on imaging.  No history of alcohol use.  She has been hydrated with IV fluids and clinically improving.  -Improving clinically, advance diet to full liquids  2)New onset atrial fibrillation.  CHADSVASc of 8.  She is currently on oral diltiazem for rate control.  Plan to switch to Cardizem CD, cardiology input appreciated, continue Eliquis 2.5 mg twice daily for stroke prophylaxis   3) E. coli UTI ----stop Rocephin, treat with Levaquin per sensitivity report   4)Acute respiratory failure with hypercarbia--- improving off opiates, pulmonology consult from Dr. Melvyn Novas appreciated, holding off on possible  thoracentesis--- BiPAP as needed  5)Acute on chronic diastolic congestive heart failure-right-sided heart failure symptoms noted, cardiology advised Lasix IV x1 today  6)Acute kidney injury on chronic kidney disease stage III.  Baseline creatinine 1.4.  Creatinine initially bumped up since admission.  Possibly related to acute pancreatitis.  She also did receive IV contrast on admission. --IV Lasix per cardiology, watch renal function closely with diuresis   7)Diabetes---  She is chronically on high-dose Levemir. --Monitor Levemir dosage closely as oral intake improves may increase insulin dosage  8)HTN--- continue Cardizem and hydralazin  9)Abnormal imaging findings. Imaging findings suggest thyroid nodule and possible --- outpatient follow-up with PCP for further investigation/work-up strongly advised  10)Morbid obesity.  BMI 41.  Would benefit from weight loss.  11) generalized weakness/deconditioning--PT eval appreciated recommends SNF rehab   DVT prophylaxis: Eliquis Code Status: Full code Family Communication: Discussed with patient's daughter [name] at bedside  Disposition Plan: Awaiting SNF rehab bed as long as she tolerates oral intake and rate control remains adequate  Consultants:   Cardiology/pulmonology  Procedures:     Antimicrobials:   Ceftriaxone 3/7 > thru 3/9  -Levaquin started on 07/26/2019   Subjective: -Daughter at bedside, shortness of breath is better, eager to eat more, no chest pains.  Objective: Vitals:   07/26/19 1537 07/26/19 1700 07/26/19 1800 07/26/19 1900  BP: (!) 153/83 (!) 173/77 (!) 162/99 (!) 166/85  Pulse: (!) 106 93 92 88  Resp:  (!) 22 (!) 21 (!) 21  Temp:  98.4 F (36.9 C)    TempSrc:  Oral    SpO2: 95% 93% 94% 95%  Weight:  Height:        Intake/Output Summary (Last 24 hours) at 07/26/2019 1914 Last data filed at 07/26/2019 1842 Gross per 24 hour  Intake 1689.34 ml  Output 2600 ml  Net -910.66 ml   Filed Weights    07/23/19 2017 07/24/19 2056 07/26/19 0625  Weight: 105 kg 108.5 kg 109.7 kg    Examination:  General exam: Awake, oriented x3  respiratory system: Diminished in bases, no wheezing Cardiovascular system:irregular. No murmurs, rubs, gallops. Gastrointestinal system: Abdomen is nondistended, soft and diffusely tender. Normal bowel sounds heard. Central nervous system: Generalized weakness, no focal neurological deficits. Extremities: 1+ edema bilaterally Skin: No rashes, lesions or ulcers Psychiatry: Affect is appropriate   Data Reviewed:   CBC: Recent Labs  Lab 07/23/19 1321 07/24/19 0419 07/25/19 0444  WBC 11.9* 8.8 10.7*  NEUTROABS 9.7*  --   --   HGB 11.1* 10.0* 9.5*  HCT 36.4 33.5* 31.4*  MCV 91.7 93.3 92.4  PLT 247 204 829   Basic Metabolic Panel: Recent Labs  Lab 07/23/19 1321 07/23/19 1526 07/24/19 0419 07/25/19 0444  NA 141  --  139 139  K 4.1  --  4.9 4.9  CL 106  --  105 107  CO2 27  --  23 24  GLUCOSE 203*  --  386* 340*  BUN 32*  --  40* 58*  CREATININE 1.54*  --  2.03* 2.14*  CALCIUM 8.7*  --  8.1* 7.8*  MG  --  2.2  --   --    GFR: Estimated Creatinine Clearance: 24.1 mL/min (A) (by C-G formula based on SCr of 2.14 mg/dL (H)). Liver Function Tests: Recent Labs  Lab 07/23/19 1321 07/24/19 0419 07/25/19 0444  AST 17 16 23   ALT 16 16 27   ALKPHOS 55 46 44  BILITOT 0.6 0.5 0.4  PROT 8.0 7.2 7.0  ALBUMIN 3.6 3.2* 3.0*   Recent Labs  Lab 07/23/19 1321 07/24/19 0419 07/25/19 0444  LIPASE 2,264* 1,489* 150*   No results for input(s): AMMONIA in the last 168 hours. Coagulation Profile: No results for input(s): INR, PROTIME in the last 168 hours. Cardiac Enzymes: No results for input(s): CKTOTAL, CKMB, CKMBINDEX, TROPONINI in the last 168 hours. BNP (last 3 results) No results for input(s): PROBNP in the last 8760 hours. HbA1C: No results for input(s): HGBA1C in the last 72 hours. CBG: Recent Labs  Lab 07/25/19 1642 07/25/19 2125  07/26/19 0748 07/26/19 1154 07/26/19 1629  GLUCAP 235* 174* 132* 139* 194*   Lipid Profile: Recent Labs    07/24/19 0419  CHOL 118  HDL 38*  LDLCALC 73  TRIG 35  CHOLHDL 3.1   Thyroid Function Tests: Recent Labs    07/24/19 0419  TSH 0.420   Anemia Panel: No results for input(s): VITAMINB12, FOLATE, FERRITIN, TIBC, IRON, RETICCTPCT in the last 72 hours. Sepsis Labs: No results for input(s): PROCALCITON, LATICACIDVEN in the last 168 hours.  Recent Results (from the past 240 hour(s))  SARS CORONAVIRUS 2 (TAT 6-24 HRS) Nasopharyngeal Nasopharyngeal Swab     Status: None   Collection Time: 07/23/19  1:32 PM   Specimen: Nasopharyngeal Swab  Result Value Ref Range Status   SARS Coronavirus 2 NEGATIVE NEGATIVE Final    Comment: (NOTE) SARS-CoV-2 target nucleic acids are NOT DETECTED. The SARS-CoV-2 RNA is generally detectable in upper and lower respiratory specimens during the acute phase of infection. Negative results do not preclude SARS-CoV-2 infection, do not rule out co-infections with other pathogens, and  should not be used as the sole basis for treatment or other patient management decisions. Negative results must be combined with clinical observations, patient history, and epidemiological information. The expected result is Negative. Fact Sheet for Patients: SugarRoll.be Fact Sheet for Healthcare Providers: https://www.woods-mathews.com/ This test is not yet approved or cleared by the [place] FDA and  has been authorized for detection and/or diagnosis of SARS-CoV-2 by FDA under an Emergency Use Authorization (EUA). This EUA will remain  in effect (meaning this test can be used) for the duration of the COVID-19 declaration under Section 56 4(b)(1) of the Act, 21 U.S.C. section 360bbb-3(b)(1), unless the authorization is terminated or revoked sooner. Performed at [facility], [place] [address] [place], [place]  [address]   Urine culture     Status: Abnormal   Collection Time: 07/23/19  2:26 PM   Specimen: Urine, Clean Catch  Result Value Ref Range Status   Specimen Description   Final    URINE, CLEAN CATCH Performed at [facility], [address], [place], [place] [address]    Special Requests   Final    NONE Performed at [facility], [address], [place], [place] [address]    Culture >=100,000 COLONIES/mL ESCHERICHIA COLI (A)  Final   Report Status 07/26/2019 FINAL  Final   Organism ID, Bacteria ESCHERICHIA COLI (A)  Final      Susceptibility   Escherichia coli - MIC*    AMPICILLIN >=32 RESISTANT Resistant     CEFAZOLIN >=64 RESISTANT Resistant     CEFTRIAXONE 2 INTERMEDIATE Intermediate     CIPROFLOXACIN <=0.25 SENSITIVE Sensitive     GENTAMICIN <=1 SENSITIVE Sensitive     IMIPENEM <=0.25 SENSITIVE Sensitive     NITROFURANTOIN <=16 SENSITIVE Sensitive     TRIMETH/SULFA <=20 SENSITIVE Sensitive     AMPICILLIN/SULBACTAM >=32 RESISTANT Resistant     PIP/TAZO 8 SENSITIVE Sensitive     * >=100,000 COLONIES/mL ESCHERICHIA COLI  MRSA PCR Screening     Status: None   Collection Time: 07/24/19  8:44 PM   Specimen: Nasal Mucosa; Nasopharyngeal  Result Value Ref Range Status   MRSA by PCR NEGATIVE NEGATIVE Final    Comment:        The GeneXpert MRSA Assay (FDA approved for NASAL specimens only), is one component of a comprehensive MRSA colonization surveillance program. It is not intended to diagnose MRSA infection nor to guide or monitor treatment for MRSA infections. Performed at [facility], [address], [place], [place] [address]          Radiology Studies: DG CHEST PORT 1 VIEW  Result Date: 07/25/2019 CLINICAL DATA:  Shortness of breath. EXAM: PORTABLE CHEST 1 VIEW COMPARISON:  07/23/2019 FINDINGS: The heart is enlarged but appears stable. Progressive central vascular congestion and probable mild interstitial edema. No pleural effusions or focal infiltrates. The bony  thorax is intact. IMPRESSION: Cardiac enlargement with progressive vascular congestion and probable mild interstitial edema. Electronically Signed   By: Marijo Sanes M.D.   On: 07/25/2019 05:08        Scheduled Meds: . apixaban  2.5 mg Oral BID  . Chlorhexidine Gluconate Cloth  6 each Topical Daily  . diltiazem  30 mg Oral Q6H  . gabapentin  300 mg Oral BID  . hydrALAZINE  50 mg Oral BID  . insulin aspart  0-5 Units Subcutaneous QHS  . insulin aspart  0-6 Units Subcutaneous TID WC  . insulin detemir  60 Units Subcutaneous QHS  .  pantoprazole (PROTONIX) IV  40 mg Intravenous Q24H  . sodium chloride flush  3 mL Intravenous Q12H   Continuous Infusions: . sodium chloride 100 mL/hr at 07/26/19 1906  . sodium chloride    . levofloxacin (LEVAQUIN) IV Stopped (07/26/19 1005)     LOS: 3 days   Roxan Hockey, MD [facility]   If 7PM-7AM, please contact night-coverage [email or web address]  07/26/2019, 7:14 PM   Clemmie Buelna Denton Brick

## 2019-07-26 NOTE — Plan of Care (Signed)
  Problem: Acute Rehab PT Goals(only PT should resolve) Goal: Pt Will Go Supine/Side To Sit Outcome: Progressing Flowsheets (Taken 07/26/2019 1545) Pt will go Supine/Side to Sit:  with moderate assist  with minimal assist Goal: Patient Will Transfer Sit To/From Stand Outcome: Progressing Flowsheets (Taken 07/26/2019 1545) Patient will transfer sit to/from stand:  with minimal assist  with moderate assist Goal: Pt Will Transfer Bed To Chair/Chair To Bed Outcome: Progressing Flowsheets (Taken 07/26/2019 1545) Pt will Transfer Bed to Chair/Chair to Bed: with mod assist Goal: Pt Will Ambulate Outcome: Progressing Flowsheets (Taken 07/26/2019 1545) Pt will Ambulate:  15 feet  with moderate assist  with rolling walker   3:45 PM, 07/26/19 Lonell Grandchild, MPT Physical Therapist with [facility] [phone] office [phone] mobile phone

## 2019-07-26 NOTE — Progress Notes (Signed)
Progress Note  Patient Name: [name] Date of Encounter: 07/26/2019  Primary Cardiologist: Kate Sable, MD  Subjective   States that she feels achy all over.  Has not moved her bowels as yet.  No chest pain or palpitations.  Inpatient Medications    Scheduled Meds: . apixaban  2.5 mg Oral BID  . Chlorhexidine Gluconate Cloth  6 each Topical Daily  . diltiazem  30 mg Oral Q6H  . gabapentin  300 mg Oral BID  . hydrALAZINE  50 mg Oral BID  . insulin aspart  0-5 Units Subcutaneous QHS  . insulin aspart  0-6 Units Subcutaneous TID WC  . insulin detemir  60 Units Subcutaneous QHS  . pantoprazole (PROTONIX) IV  40 mg Intravenous Q24H  . sodium chloride flush  3 mL Intravenous Q12H   Continuous Infusions: . sodium chloride 100 mL/hr at 07/26/19 0629  . sodium chloride    . cefTRIAXone (ROCEPHIN)  IV 1 g (07/25/19 1729)   PRN Meds: sodium chloride, acetaminophen **OR** acetaminophen, labetalol, levalbuterol, polyethylene glycol, promethazine, sodium chloride flush, traZODone   Vital Signs    Vitals:   07/26/19 0500 07/26/19 0530 07/26/19 0600 07/26/19 0625  BP: 121/69 130/87 137/68 137/68  Pulse: 96 72 61   Resp: 17 17 16    Temp:    98.2 F (36.8 C)  TempSrc:    Oral  SpO2: 97% 97% 97%   Weight:    109.7 kg  Height:        Intake/Output Summary (Last 24 hours) at 07/26/2019 0758 Last data filed at 07/26/2019 0629 Gross per 24 hour  Intake 3136.89 ml  Output 900 ml  Net 2236.89 ml   Filed Weights   07/23/19 2017 07/24/19 2056 07/26/19 0625  Weight: 105 kg 108.5 kg 109.7 kg    Telemetry    Atrial fibrillation.  No clinically significant pauses in the last 24 hours.  Personally reviewed.  ECG    An ECG dated 07/24/2019 was personally reviewed today and demonstrated:  Atrial fibrillation with left bundle branch block.  Physical Exam   GEN: No acute distress.  States that she does not feel well this morning. Neck: No JVD. Cardiac:  Irregularly  irregular, soft systolic murmur, no gallop.  Respiratory: Nonlabored. Clear to auscultation bilaterally. GI:  Mildly tender, decreased bowel sounds, no guarding or rebound. MS: No edema; No deformity. Neuro:  Nonfocal. Psych: Alert and oriented x 3. Normal affect.  Labs    Chemistry Recent Labs  Lab 07/23/19 1321 07/24/19 0419 07/25/19 0444  NA 141 139 139  K 4.1 4.9 4.9  CL 106 105 107  CO2 27 23 24   GLUCOSE 203* 386* 340*  BUN 32* 40* 58*  CREATININE 1.54* 2.03* 2.14*  CALCIUM 8.7* 8.1* 7.8*  PROT 8.0 7.2 7.0  ALBUMIN 3.6 3.2* 3.0*  AST 17 16 23   ALT 16 16 27   ALKPHOS 55 46 44  BILITOT 0.6 0.5 0.4  GFRNONAA 31* 22* 21*  GFRAA 36* 26* 24*  ANIONGAP 8 11 8      Hematology Recent Labs  Lab 07/23/19 1321 07/24/19 0419 07/25/19 0444  WBC 11.9* 8.8 10.7*  RBC 3.97 3.59* 3.40*  HGB 11.1* 10.0* 9.5*  HCT 36.4 33.5* 31.4*  MCV 91.7 93.3 92.4  MCH 28.0 27.9 27.9  MCHC 30.5 29.9* 30.3  RDW 16.5* 16.8* 16.7*  PLT 247 204 218    Cardiac Enzymes Recent Labs  Lab 07/23/19 1321 07/23/19 1526  TROPONINIHS 14 14  BNP Recent Labs  Lab 07/23/19 1321  BNP 104.0*     Radiology    DG CHEST PORT 1 VIEW  Result Date: 07/25/2019 CLINICAL DATA:  Shortness of breath. EXAM: PORTABLE CHEST 1 VIEW COMPARISON:  07/23/2019 FINDINGS: The heart is enlarged but appears stable. Progressive central vascular congestion and probable mild interstitial edema. No pleural effusions or focal infiltrates. The bony thorax is intact. IMPRESSION: Cardiac enlargement with progressive vascular congestion and probable mild interstitial edema. Electronically Signed   By: Marijo Sanes M.D.   On: 07/25/2019 05:08    Cardiac Studies   Echocardiogram 07/27/2019: 1. Left ventricular ejection fraction, by estimation, is 60 to 65%. The  left ventricle has normal function. The left ventrical has no regional  wall motion abnormalities. There is moderately increased left ventricular  hypertrophy.  Left ventricular  diastolic parameters are consistent with Grade II diastolic dysfunction  (pseudonormalization). Elevated left atrial pressure.  2. Right ventricular systolic function is normal. The right ventricular  size is normal.  3. Left atrial size was severely dilated.  4. The mitral valve is normal in structure and function. trivial mitral  valve regurgitation. No evidence of mitral stenosis.  5. The aortic valve is tricuspid. Aortic valve regurgitation is not  visualized. No aortic stenosis is present.  6. The inferior vena cava is normal in size with greater than 50%  respiratory variability, suggesting right atrial pressure of 3 mmHg.  Lexiscan Myoview 07/18/2019:  Nuclear stress EF: 55%.  The left ventricular ejection fraction is normal (55-65%).  There was no ST segment deviation noted during stress.  There is a medium defect of moderate severity present in the basal anterior, basal anteroseptal, mid anterior and mid anteroseptal location. The defect is non-reversible and consistent with breast attenuation artifact.  This is a low risk study.  Patient Profile     85 y.o. female with a history of chronic diastolic heart failure, previous stroke, hypertension, type 2 diabetes mellitus, left branch block, and CKD stage III. She is currently admitted with acute pancreatitis and newly documented, persistent atrial fibrillation.  Assessment & Plan    1.  Newly documented, persistent atrial fibrillation with CHA2DS2-VASc score of 8.  Heart rate control is adequate in light of current situation.  No clinically significant pauses by telemetry in the last 24 hours, longest has been less than 3 seconds.  She remains on Eliquis 2.5 mg twice daily and Cardizem 30 mg p.o. every 6 hours.   2.  Chronic diastolic heart failure by history.  Recent echocardiogram showed LVEF 60 to 65% with moderate diastolic dysfunction and normal RV contraction.  Typically on Demadex 20 mg daily at  home.  Chest x-ray from yesterday did show increasing interstitial edema.  Intake and output are incomplete, but patient has had a positive fluid balance.  3.  Acute on chronic renal failure, creatinine 2.14.  CKD stage IIIb at baseline.  4.  Acute pancreatitis, etiology uncertain with further work-up and management by primary team.  She remains n.p.o. except for medications.  Continue Eliquis 2.5 mg twice daily and Cardizem 30 mg p.o. every 6 hours.  AV nodal blocker therapy can eventually be consolidated to Cardizem CD once clinical status has improved and she is more stable.  Will give dose of IV Lasix today.  Signed, Rozann Lesches, MD  07/26/2019, 7:58 AM

## 2019-07-27 ENCOUNTER — Inpatient Hospital Stay (HOSPITAL_COMMUNITY): Payer: Medicare HMO

## 2019-07-27 DIAGNOSIS — J810 Acute pulmonary edema: Secondary | ICD-10-CM

## 2019-07-27 DIAGNOSIS — J9601 Acute respiratory failure with hypoxia: Secondary | ICD-10-CM

## 2019-07-27 DIAGNOSIS — I5033 Acute on chronic diastolic (congestive) heart failure: Secondary | ICD-10-CM

## 2019-07-27 DIAGNOSIS — I10 Essential (primary) hypertension: Secondary | ICD-10-CM

## 2019-07-27 LAB — COMPREHENSIVE METABOLIC PANEL
ALT: 21 U/L (ref 0–44)
AST: 12 U/L — ABNORMAL LOW (ref 15–41)
Albumin: 2.7 g/dL — ABNORMAL LOW (ref 3.5–5.0)
Alkaline Phosphatase: 43 U/L (ref 38–126)
Anion gap: 6 (ref 5–15)
BUN: 39 mg/dL — ABNORMAL HIGH (ref 8–23)
CO2: 24 mmol/L (ref 22–32)
Calcium: 7.9 mg/dL — ABNORMAL LOW (ref 8.9–10.3)
Chloride: 111 mmol/L (ref 98–111)
Creatinine, Ser: 1.53 mg/dL — ABNORMAL HIGH (ref 0.44–1.00)
GFR calc Af Amer: 36 mL/min — ABNORMAL LOW (ref >60)
GFR calc non Af Amer: 31 mL/min — ABNORMAL LOW (ref >60)
Glucose, Bld: 153 mg/dL — ABNORMAL HIGH (ref 70–99)
Potassium: 4.2 mmol/L (ref 3.5–5.1)
Sodium: 141 mmol/L (ref 135–145)
Total Bilirubin: 0.5 mg/dL (ref 0.3–1.2)
Total Protein: 6.9 g/dL (ref 6.5–8.1)

## 2019-07-27 LAB — CBC
HCT: 31.4 % — ABNORMAL LOW (ref 36.0–46.0)
Hemoglobin: 9.5 g/dL — ABNORMAL LOW (ref 12.0–15.0)
MCH: 28 pg (ref 26.0–34.0)
MCHC: 30.3 g/dL (ref 30.0–36.0)
MCV: 92.6 fL (ref 80.0–100.0)
Platelets: 225 10*3/uL (ref 150–400)
RBC: 3.39 MIL/uL — ABNORMAL LOW (ref 3.87–5.11)
RDW: 16.5 % — ABNORMAL HIGH (ref 11.5–15.5)
WBC: 9.5 10*3/uL (ref 4.0–10.5)
nRBC: 0 % (ref 0.0–0.2)

## 2019-07-27 LAB — GLUCOSE, CAPILLARY
Glucose-Capillary: 139 mg/dL — ABNORMAL HIGH (ref 70–99)
Glucose-Capillary: 182 mg/dL — ABNORMAL HIGH (ref 70–99)
Glucose-Capillary: 183 mg/dL — ABNORMAL HIGH (ref 70–99)
Glucose-Capillary: 142 mg/dL — ABNORMAL HIGH (ref 70–99)

## 2019-07-27 MED ORDER — FUROSEMIDE 10 MG/ML IJ SOLN
40.0000 mg | Freq: Once | INTRAMUSCULAR | Status: AC
  Administered 2019-07-27: 40 mg via INTRAVENOUS
  Filled 2019-07-27: qty 4

## 2019-07-27 MED ORDER — IPRATROPIUM-ALBUTEROL 0.5-2.5 (3) MG/3ML IN SOLN: 3.0000 mL | RESPIRATORY_TRACT | Status: DC | PRN

## 2019-07-27 MED ORDER — FUROSEMIDE 10 MG/ML IJ SOLN
40.0000 mg | Freq: Two times a day (BID) | INTRAMUSCULAR | Status: DC
  Administered 2019-07-27 – 2019-07-31 (×9): 40 mg via INTRAVENOUS
  Filled 2019-07-27 (×9): qty 4

## 2019-07-27 MED ORDER — DILTIAZEM HCL ER COATED BEADS 120 MG PO CP24
120.0000 mg | ORAL_CAPSULE | Freq: Every day | ORAL | Status: DC
  Administered 2019-07-27 – 2019-07-31 (×5): 120 mg via ORAL
  Filled 2019-07-27 (×5): qty 1

## 2019-07-27 MED ORDER — LEVALBUTEROL HCL 0.63 MG/3ML IN NEBU
0.6300 mg | INHALATION_SOLUTION | Freq: Four times a day (QID) | RESPIRATORY_TRACT | Status: DC
  Administered 2019-07-28 – 2019-07-29 (×5): 0.63 mg via RESPIRATORY_TRACT
  Filled 2019-07-27 (×5): qty 3

## 2019-07-27 MED ORDER — LORAZEPAM 2 MG/ML IJ SOLN
0.5000 mg | Freq: Once | INTRAMUSCULAR | Status: AC
  Administered 2019-07-27: 0.5 mg via INTRAVENOUS
  Filled 2019-07-27: qty 1

## 2019-07-27 NOTE — NC FL2 (Signed)
[place] LEVEL OF CARE SCREENING TOOL     IDENTIFICATION  Patient Name: [name] Birthdate: 08/23/1934 Sex: female Admission Date (Current Location): 07/23/2019  [facility] and [place] Number:  [employer] and Address:  [place] [address], [place]      Provider Number: [id number]  Attending Physician Name and Address:  Roxan Hockey, MD  Relative Name and Phone Number:       Current Level of Care: Hospital Recommended Level of Care: [place] Prior Approval Number:    Date Approved/Denied:   PASRR Number: [id number]  Discharge Plan: SNF    Current Diagnoses: Patient Active Problem List   Diagnosis Date Noted  . AKI (acute kidney injury) ([place]) 07/25/2019  . Acute respiratory failure with hypercapnia ([place]) 07/24/2019  . Pancreatitis, acute 07/23/2019  . Diabetes mellitus type 2 with complications ([place]) 30/86/5784  . New onset a-fib ([place]) 07/23/2019  . Chronic diastolic CHF (congestive heart failure) (HCC)/EF > 60 % 07/23/2019  . Shortness of breath 06/19/2019  . Acute diastolic CHF (congestive heart failure) ([place]) 07/08/2017  . Acute respiratory failure with hypoxia ([place]) 07/08/2017  . Uncontrolled type 2 diabetes mellitus with diabetic nephropathy, with long-term current use of insulin ([place]) 07/08/2017  . CKD (chronic kidney disease), stage IIIb 07/08/2017  . Essential hypertension 07/08/2017  . Hypertension 06/23/2017  . Prolapsed internal hemorrhoids, grade 3 06/25/2016  . Abdominal pain 05/08/2015  . Dysphagia, pharyngoesophageal phase   . Other hemorrhoids   . Diverticulosis of colon without hemorrhage   . GERD (gastroesophageal reflux disease) 02/28/2014  . Constipation 02/28/2014  . Esophageal dysphagia 02/28/2014  . Rectal bleeding 02/28/2014  . OSTEOARTHRITIS, KNEE, RIGHT, SEVERE 12/13/2008  . DEGENERATIVE DISC DISEASE, LUMBOSACRAL SPINE W/RADICULOPATHY 12/13/2008  . BACK PAIN  12/13/2008  . DIABETES 04/13/2007  . FRACTURE, FEMUR, INTERTROCHANTERIC REGION 04/13/2007    Orientation RESPIRATION BLADDER Height & Weight     Self, Time, Situation, Place  O2(see dc summary) Continent Weight: 242 lb 11.6 oz (110.1 kg) Height:  5\' 4"  (162.6 cm)  BEHAVIORAL SYMPTOMS/MOOD NEUROLOGICAL BOWEL NUTRITION STATUS      Continent Diet(see dc summary)  AMBULATORY STATUS COMMUNICATION OF NEEDS Skin   Extensive Assist Verbally Normal                       Personal Care Assistance Level of Assistance  Bathing, Feeding, Dressing Bathing Assistance: Limited assistance Feeding assistance: Independent Dressing Assistance: Limited assistance     Functional Limitations Info  Sight, Hearing, Speech Sight Info: Adequate Hearing Info: Adequate Speech Info: Adequate    SPECIAL CARE FACTORS FREQUENCY  PT (By licensed PT), OT (By licensed OT)     PT Frequency: 5 times weekly OT Frequency: 3 times weekly            Contractures Contractures Info: Not present    Additional Factors Info  Code Status, Allergies Code Status Info: full Allergies Info: NKA           Current Medications (07/27/2019):  This is the current hospital active medication list Current Facility-Administered Medications  Medication Dose Route Frequency Provider Last Rate Last Admin  . 0.9 %  sodium chloride infusion  250 mL Intravenous PRN Emokpae, Courage, MD      . acetaminophen (TYLENOL) tablet 650 mg  650 mg Oral Q6H PRN Denton Brick, Courage, MD   650 mg at 07/27/19 0854   Or  . acetaminophen (TYLENOL) suppository  650 mg  650 mg Rectal Q6H PRN Emokpae, Courage, MD      . apixaban (ELIQUIS) tablet 2.5 mg  2.5 mg Oral BID Denton Brick, Courage, MD   2.5 mg at 07/27/19 0854  . Chlorhexidine Gluconate Cloth 2 % PADS 6 each  6 each Topical Daily Kathie Dike, MD   6 each at 07/27/19 0901  . diltiazem (CARDIZEM CD) 24 hr capsule 120 mg  120 mg Oral Daily Satira Sark, MD   120 mg at 07/27/19 0853   . furosemide (LASIX) injection 40 mg  40 mg Intravenous BID Satira Sark, MD   40 mg at 07/27/19 0853  . gabapentin (NEURONTIN) capsule 300 mg  300 mg Oral BID Roxan Hockey, MD   300 mg at 07/27/19 0854  . hydrALAZINE (APRESOLINE) tablet 50 mg  50 mg Oral BID Oswald Hillock, MD   50 mg at 07/27/19 0853  . insulin aspart (novoLOG) injection 0-5 Units  0-5 Units Subcutaneous QHS Roxan Hockey, MD   2 Units at 07/26/19 2111  . insulin aspart (novoLOG) injection 0-6 Units  0-6 Units Subcutaneous TID WC Roxan Hockey, MD   1 Units at 07/26/19 1741  . insulin detemir (LEVEMIR) injection 60 Units  60 Units Subcutaneous QHS Kathie Dike, MD   60 Units at 07/26/19 2109  . labetalol (NORMODYNE) injection 10 mg  10 mg Intravenous Q4H PRN Roxan Hockey, MD   10 mg at 07/27/19 0859  . levalbuterol (XOPENEX) nebulizer solution 0.63 mg  0.63 mg Nebulization Q6H PRN Emokpae, Courage, MD      . levofloxacin (LEVAQUIN) IVPB 750 mg  750 mg Intravenous Q48H Emokpae, Courage, MD   Stopped at 07/26/19 1005  . pantoprazole (PROTONIX) injection 40 mg  40 mg Intravenous Q24H Roxan Hockey, MD   40 mg at 07/26/19 2109  . polyethylene glycol (MIRALAX / GLYCOLAX) packet 17 g  17 g Oral Daily PRN Emokpae, Courage, MD      . promethazine (PHENERGAN) injection 12.5 mg  12.5 mg Intravenous Q6H PRN Kathie Dike, MD   12.5 mg at 07/24/19 1556  . sodium chloride flush (NS) 0.9 % injection 3 mL  3 mL Intravenous Q12H Emokpae, Courage, MD   3 mL at 07/27/19 0854  . sodium chloride flush (NS) 0.9 % injection 3 mL  3 mL Intravenous PRN Emokpae, Courage, MD      . traZODone (DESYREL) tablet 50 mg  50 mg Oral QHS PRN Denton Brick, Courage, MD   50 mg at 07/26/19 2110   Facility-Administered Medications Ordered in Other Encounters  Medication Dose Route Frequency Provider Last Rate Last Admin  . regadenoson (LEXISCAN) injection SOLN 0.4 mg  0.4 mg Intravenous Once Croitoru, Mihai, MD         Discharge  Medications: Please see discharge summary for a list of discharge medications.  Relevant Imaging Results:  Relevant Lab Results:   Additional Information SSN: [id number] LCSW

## 2019-07-27 NOTE — Progress Notes (Signed)
Pt has orders to move to Telemetry floor. Went in to administer her scheduled protonix and could audibly hear wheezing and see her work of breathing. Called Dr Humphrey Rolls out of concern that if we moved patient upstairs, she would decompensate. Received orders for a duoneb treatment and Dr. Humphrey Rolls will come to bedside to assess patient. She is currently on 2L [place]; increased oxygen to 3L  and respiratory on the floor.

## 2019-07-27 NOTE — Progress Notes (Signed)
PULMONARY / CRITICAL CARE MEDICINE   NAME:  [name], MRN:  [id number], DOB:  11-Sep-1934, LOS: 4 ADMISSION DATE:  07/23/2019, CONSULTATION DATE:  3/9 REFERRING MD:  [facility], CHIEF COMPLAINT:  resp distress  BRIEF HISTORY:     66 yobf never smoker admit 3/7 with acute pancreatitis ? Etiology with new AF and rx with eliquis/fluids /cardizem with progressive hypersomnolence and found to be hypercarbic  am 3/9 and moved to ICU for bipap and pccm service asked to see.    HISTORY OF PRESENT ILLNESS   37 yobf with HTN, DM, CKD 3B, dCHF , chronic LBB and chronic lower extremity edema who presented to the ED 3/7  with epigastric pain associated with nausea and vomiting and lipase > 2200 ? Etiology and acute renal insufficiency /rapid afib   Was moved to icu for hypersomnolence / hypercabia and improved and requested bipap off/ denies ongoing nausea chest/abd pain or sob.   SIGNIFICANT PAST MEDICAL HISTORY    . Arthritis   . Chronic diastolic heart failure ([place])   . CKD (chronic kidney disease)   . CVA (cerebral infarction)   . Diabetes mellitus without complication ([place])   . GERD (gastroesophageal reflux disease)   . Hemorrhoids   . Hypertension   . Stroke [facility])      SIGNIFICANT EVENTS:  3/9 transferred to ICU for Bipap due to hypercarbia 3/10 converted back to NSR  STUDIES:   Echo 06/29/19:  Grade II diastolic dysfunction with severe LAE Cta Chest 3/7  :1. No evidence of acute pulmonary artery filling defect to suggest pulmonary embolism. 2. Features most suggestive of likely cardiogenic pulmonary edema with small bilateral effusions and reflux of contrast into the hepatic veins which can suggest some right-sided heart dysfunction or elevated right heart pressures. 3. Borderline enlarged low-attenuation mediastinal and hilar nodes, likely reactive/edematous. CTabd 3/7 1. Mild diffuse peripancreatic inflammation compatible with acute pancreatitis particularly given the setting  of a elevated lipase. No evidence of pancreatic necrosis, significant free fluid or organized pancreatic collections. Paraduodenal inflammation is likely reactive. 2. Mild pancreatic ductal dilatation, can be related to the acute pancreatic process. Attention on follow-up imaging is recommended. If persistently dilated, MRCP could be obtained.   CULTURES:  UC 3/7  100k e coli intermediate to rocephin / sensitive to cipro  Covid 19 pcr  3/7 neg  MRSA PCR 3/8 neg    ANTIBIOTICS:   Rocephin 3/7 >>>3/10 Levaquin 3/10 >>>  LINES/TUBES:     CONSULTANTS:  PCCM 3/9  Cardiology 3/9  Scheduled Meds: . apixaban  2.5 mg Oral BID  . Chlorhexidine Gluconate Cloth  6 each Topical Daily  . diltiazem  120 mg Oral Daily  . furosemide  40 mg Intravenous BID  . gabapentin  300 mg Oral BID  . hydrALAZINE  50 mg Oral BID  . insulin aspart  0-5 Units Subcutaneous QHS  . insulin aspart  0-6 Units Subcutaneous TID WC  . insulin detemir  60 Units Subcutaneous QHS  . pantoprazole (PROTONIX) IV  40 mg Intravenous Q24H  . sodium chloride flush  3 mL Intravenous Q12H   Continuous Infusions: . sodium chloride    . levofloxacin (LEVAQUIN) IV Stopped (07/26/19 1005)   PRN Meds:.sodium chloride, acetaminophen **OR** acetaminophen, labetalol, levalbuterol, polyethylene glycol, promethazine, sodium chloride flush, traZODone   SUBJECTIVE:  No abd pain, nausea, back on NP but denies sob/ wants to eat   CONSTITUTIONAL: BP (!) 145/72   Pulse 73   Temp 98.3 F (  36.8 C) (Oral)   Resp 18   Ht 5\' 4"  (1.626 m)   Wt 110.1 kg   SpO2 98%   BMI 41.66 kg/m    Intake/Output Summary (Last 24 hours) at 07/27/2019 1456 Last data filed at 07/27/2019 1200 Gross per 24 hour  Intake 1644.15 ml  Output 2850 ml  Net -1205.85 ml          PHYSICAL EXAM: General:  Elderly bf / nad at 45 degrees hob and 2lpm NP  Neuro:  Alert/ approp HEENT: neck supple, no jvd  Cardiovascular:  RRR still in SR Lungs:   Decreased bs  Abdomen:  Mod distended/ mild diffuse tenderness   Musculoskeletal: warm with severe venous stasis ? Early elephantiasis Skin: no rash / skin breakdown       I personally reviewed images and agree with radiology impression as follows:  pCXR:   3/11 Progressive interstitial and patchy nodular airspace process.     ASSESSMENT AND PLAN   1) acute hypoxemic/hypercarbic resp failure in never smoker with pancreatitis with prior use of opioids on hold and was doing better but appears vol overloaded on cxr now > diuresis in progress  - prob element of bilateral "sympathetic" pleural effusions but they are small and probably will resolve with the pancreatitis  - no need for bipap > d/c from room     2) Atrial fib with diastolic dysfunction on cardizem/doac - cards following > converted to SR 3/10 pm   3) Acute pancreatitis ? Etiology s/p remote cholecystectomy s obstruction apparent  - w/u ? GI ? But apears to be resolving s obvious complication  4) AKI s/p IV contrast/ 3rd spacing due to sirs from pancreatits Lab Results  Component Value Date   CREATININE 1.53 (H) 07/27/2019   CREATININE 2.14 (H) 07/25/2019   CREATININE 2.03 (H) 07/24/2019   monitor uop/ avoid nephrotoxins  5) E coli UTI - changed to levaquin by [facility], see above flowsheet        LABS  Glucose Recent Labs  Lab 07/26/19 0748 07/26/19 1154 07/26/19 1629 07/26/19 2104 07/27/19 0749 07/27/19 1133  GLUCAP 132* 139* 194* 214* 139* 182*    BMET Recent Labs  Lab 07/24/19 0419 07/25/19 0444 07/27/19 0426  NA 139 139 141  K 4.9 4.9 4.2  CL 105 107 111  CO2 23 24 24   BUN 40* 58* 39*  CREATININE 2.03* 2.14* 1.53*  GLUCOSE 386* 340* 153*    Liver Enzymes Recent Labs  Lab 07/24/19 0419 07/25/19 0444 07/27/19 0426  AST 16 23 12*  ALT 16 27 21   ALKPHOS 46 44 43  BILITOT 0.5 0.4 0.5  ALBUMIN 3.2* 3.0* 2.7*    Electrolytes Recent Labs  Lab 07/23/19 1321 07/23/19 1526  07/24/19 0419 07/25/19 0444 07/27/19 0426  CALCIUM   < >  --  8.1* 7.8* 7.9*  MG  --  2.2  --   --   --    < > = values in this interval not displayed.    CBC Recent Labs  Lab 07/24/19 0419 07/25/19 0444 07/27/19 0426  WBC 8.8 10.7* 9.5  HGB 10.0* 9.5* 9.5*  HCT 33.5* 31.4* 31.4*  PLT 204 218 225    ABG Recent Labs  Lab 07/24/19 1655 07/25/19 0620  PHART 7.282* 7.310*  PCO2ART 51.4* 51.6*  PO2ART 99.8 87.1    Coag's No results for input(s): APTT, INR in the last 168 hours.  Sepsis Markers No results for input(s): LATICACIDVEN, PROCALCITON, O2SATVEN  in the last 168 hours.  Cardiac Enzymes No results for input(s): TROPONINI, PROBNP in the last 168 hours.         Christinia Gully, MD Pulmonary and [place] [phone] After 6:00 PM or weekends, use Beeper [phone]  After 7pm call Elink  [phone]

## 2019-07-27 NOTE — Progress Notes (Signed)
PROGRESS NOTE    [name]  GHW:[id number] DOB: 1935-05-17 DOA: 07/23/2019 PCP: [name], MD    Brief Narrative:  85 year old female with a history of hypertension, diabetes, chronic kidney disease stage III, diastolic heart failure, admitted to the hospital with acute pancreatitis.  She was also noted to have new onset atrial fibrillation.  Started on IV hydration.  Started on Cardizem for rate control and Eliquis for anticoagulation.  She developed acute kidney injury on chronic kidney disease.  Shortly after admission, she was noted to be increasingly somnolent.  Blood gas was checked and she was noted to to have increased pCO2.  She was transferred to stepdown unit and started on BiPAP.   Assessment & Plan:   Principal Problem:   Pancreatitis, acute Active Problems:   New onset a-fib ([place])   Uncontrolled type 2 diabetes mellitus with diabetic nephropathy, with long-term current use of insulin (HCC)   Diabetes mellitus type 2 with complications (HCC)   Chronic diastolic CHF (congestive heart failure) (HCC)/EF > 60 %   Acute respiratory failure with hypoxemia (HCC)   CKD (chronic kidney disease), stage IIIb   Essential hypertension   Acute respiratory failure with hypercapnia (HCC)   AKI (acute kidney injury) ([place])   Acute pulmonary edema (HCC)   1)Acute Pancreatitis.  She is status post prior cholecystectomy. No calcified intraductal gallstones identified on imaging.  No history of alcohol use.  She has been hydrated with IV fluids and clinically improving. -Continue full liquid diet  2)New onset atrial fibrillation.  CHADSVASc of 8.  She is currently on oral diltiazem for rate control.  Plan to switch to Cardizem CD, cardiology input appreciated, continue Eliquis 2.5 mg twice daily for stroke prophylaxis  -Appears back in sinus rhythm  3) E. coli UTI ----stopped Rocephin, continue Levaquin per sensitivity report   4)Acute respiratory failure with hypercarbia--- improving  off opiates, pulmonology consult from Dr. Melvyn Novas appreciated, holding off on possible thoracentesis--- -BiPAP discontinued  5)Acute on chronic diastolic congestive heart failure-right-sided heart failure symptoms noted, cardiology advised diuresis  6)Acute kidney injury on chronic kidney disease stage III.  Baseline creatinine 1.4.  Creatinine initially bumped up since admission.  Possibly related to acute pancreatitis.  She also did receive IV contrast on admission. --IV Lasix per cardiology, watch renal function closely with diuresis  -Creatinine back down to 1.53 from a peak of 2.14  7)Diabetes---  She is chronically on high-dose Levemir. --Monitor Levemir dosage closely as oral intake improves may increase insulin dosage  8)HTN--- continue Cardizem and hydralazin  9)Abnormal imaging findings. Imaging findings suggest thyroid nodule and possible --- outpatient follow-up with PCP for further investigation/work-up strongly advised  10)Morbid obesity.  BMI 41.  Would benefit from weight loss.  11) generalized weakness/deconditioning--PT eval appreciated recommends SNF rehab   DVT prophylaxis: Eliquis Code Status: Full code Family Communication: Discussed with patient's daughter [name] at bedside  Disposition Plan:  -Transfer out of ICU to telemetry unit, okay to discharge to SNF rehab when bed is available   Consultants:   Cardiology/pulmonology  Procedures:     Antimicrobials:   Ceftriaxone 3/7 > thru 3/9  -Levaquin started on 07/26/2019   Subjective: -Daughter at bedside, her intake is fair, no chest pains or palpitations  Objective: Vitals:   07/27/19 1100 07/27/19 1202 07/27/19 1300 07/27/19 1700  BP: (!) 178/79 (!) 160/95 (!) 145/72 (!) 123/93  Pulse: 71 71 73 83  Resp: 16 19 18  (!) 21  Temp:  TempSrc:      SpO2: 97% 97% 98% 95%  Weight:      Height:        Intake/Output Summary (Last 24 hours) at 07/27/2019 1809 Last data filed at 07/27/2019 1626 Gross  per 24 hour  Intake 1644.15 ml  Output 4050 ml  Net -2405.85 ml   Filed Weights   07/24/19 2056 07/26/19 0625 07/27/19 0502  Weight: 108.5 kg 109.7 kg 110.1 kg    Examination:  General exam: Awake, oriented x3  respiratory system: Improving air movement,, no wheezing Cardiovascular system-regular, no murmurs, rubs, gallops. Gastrointestinal system: Abdomen is nondistended, soft and diffusely tender. Normal bowel sounds heard. Central nervous system: Generalized weakness, no focal neurological deficits. Extremities: 1+ edema bilaterally Skin: No rashes, lesions or ulcers Psychiatry: Affect is appropriate  Data Reviewed:   CBC: Recent Labs  Lab 07/23/19 1321 07/24/19 0419 07/25/19 0444 07/27/19 0426  WBC 11.9* 8.8 10.7* 9.5  NEUTROABS 9.7*  --   --   --   HGB 11.1* 10.0* 9.5* 9.5*  HCT 36.4 33.5* 31.4* 31.4*  MCV 91.7 93.3 92.4 92.6  PLT 247 204 218 466   Basic Metabolic Panel: Recent Labs  Lab 07/23/19 1321 07/23/19 1526 07/24/19 0419 07/25/19 0444 07/27/19 0426  NA 141  --  139 139 141  K 4.1  --  4.9 4.9 4.2  CL 106  --  105 107 111  CO2 27  --  23 24 24   GLUCOSE 203*  --  386* 340* 153*  BUN 32*  --  40* 58* 39*  CREATININE 1.54*  --  2.03* 2.14* 1.53*  CALCIUM 8.7*  --  8.1* 7.8* 7.9*  MG  --  2.2  --   --   --    GFR: Estimated Creatinine Clearance: 33.8 mL/min (A) (by C-G formula based on SCr of 1.53 mg/dL (H)). Liver Function Tests: Recent Labs  Lab 07/23/19 1321 07/24/19 0419 07/25/19 0444 07/27/19 0426  AST 17 16 23  12*  ALT 16 16 27 21   ALKPHOS 55 46 44 43  BILITOT 0.6 0.5 0.4 0.5  PROT 8.0 7.2 7.0 6.9  ALBUMIN 3.6 3.2* 3.0* 2.7*   Recent Labs  Lab 07/23/19 1321 07/24/19 0419 07/25/19 0444  LIPASE 2,264* 1,489* 150*   No results for input(s): AMMONIA in the last 168 hours. Coagulation Profile: No results for input(s): INR, PROTIME in the last 168 hours. Cardiac Enzymes: No results for input(s): CKTOTAL, CKMB, CKMBINDEX,  TROPONINI in the last 168 hours. BNP (last 3 results) No results for input(s): PROBNP in the last 8760 hours. HbA1C: No results for input(s): HGBA1C in the last 72 hours. CBG: Recent Labs  Lab 07/26/19 1629 07/26/19 2104 07/27/19 0749 07/27/19 1133 07/27/19 1621  GLUCAP 194* 214* 139* 182* 183*   Lipid Profile: No results for input(s): CHOL, HDL, LDLCALC, TRIG, CHOLHDL, LDLDIRECT in the last 72 hours. Thyroid Function Tests: No results for input(s): TSH, T4TOTAL, FREET4, T3FREE, THYROIDAB in the last 72 hours. Anemia Panel: No results for input(s): VITAMINB12, FOLATE, FERRITIN, TIBC, IRON, RETICCTPCT in the last 72 hours. Sepsis Labs: No results for input(s): PROCALCITON, LATICACIDVEN in the last 168 hours.  Recent Results (from the past 240 hour(s))  SARS CORONAVIRUS 2 (TAT 6-24 HRS) Nasopharyngeal Nasopharyngeal Swab     Status: None   Collection Time: 07/23/19  1:32 PM   Specimen: Nasopharyngeal Swab  Result Value Ref Range Status   SARS Coronavirus 2 NEGATIVE NEGATIVE Final    Comment: (NOTE)  SARS-CoV-2 target nucleic acids are NOT DETECTED. The SARS-CoV-2 RNA is generally detectable in upper and lower respiratory specimens during the acute phase of infection. Negative results do not preclude SARS-CoV-2 infection, do not rule out co-infections with other pathogens, and should not be used as the sole basis for treatment or other patient management decisions. Negative results must be combined with clinical observations, patient history, and epidemiological information. The expected result is Negative. Fact Sheet for Patients: SugarRoll.be Fact Sheet for Healthcare Providers: https://www.woods-mathews.com/ This test is not yet approved or cleared by the [place] FDA and  has been authorized for detection and/or diagnosis of SARS-CoV-2 by FDA under an Emergency Use Authorization (EUA). This EUA will remain  in effect (meaning  this test can be used) for the duration of the COVID-19 declaration under Section 56 4(b)(1) of the Act, 21 U.S.C. section 360bbb-3(b)(1), unless the authorization is terminated or revoked sooner. Performed at [facility], [place] [address], [place], [place] [address]   Urine culture     Status: Abnormal   Collection Time: 07/23/19  2:26 PM   Specimen: Urine, Clean Catch  Result Value Ref Range Status   Specimen Description   Final    URINE, CLEAN CATCH Performed at [facility], [address] [place], [place] [address]    Special Requests   Final    NONE Performed at [facility], [address], [place], [place] [address]    Culture >=100,000 COLONIES/mL ESCHERICHIA COLI (A)  Final   Report Status 07/26/2019 FINAL  Final   Organism ID, Bacteria ESCHERICHIA COLI (A)  Final      Susceptibility   Escherichia coli - MIC*    AMPICILLIN >=32 RESISTANT Resistant     CEFAZOLIN >=64 RESISTANT Resistant     CEFTRIAXONE 2 INTERMEDIATE Intermediate     CIPROFLOXACIN <=0.25 SENSITIVE Sensitive     GENTAMICIN <=1 SENSITIVE Sensitive     IMIPENEM <=0.25 SENSITIVE Sensitive     NITROFURANTOIN <=16 SENSITIVE Sensitive     TRIMETH/SULFA <=20 SENSITIVE Sensitive     AMPICILLIN/SULBACTAM >=32 RESISTANT Resistant     PIP/TAZO 8 SENSITIVE Sensitive     * >=100,000 COLONIES/mL ESCHERICHIA COLI  MRSA PCR Screening     Status: None   Collection Time: 07/24/19  8:44 PM   Specimen: Nasal Mucosa; Nasopharyngeal  Result Value Ref Range Status   MRSA by PCR NEGATIVE NEGATIVE Final    Comment:        The GeneXpert MRSA Assay (FDA approved for NASAL specimens only), is one component of a comprehensive MRSA colonization surveillance program. It is not intended to diagnose MRSA infection nor to guide or monitor treatment for MRSA infections. Performed at [facility], [address], [place], [place] [address]       Radiology Studies: [facility] 1 View  Result Date:  07/27/2019 CLINICAL DATA:  Acute respiratory failure EXAM: PORTABLE CHEST 1 VIEW COMPARISON:  07/25/2019 FINDINGS: The heart is mildly enlarged but stable. Stable prominent mediastinal and hilar contours. Progressive interstitial and patchy nodular airspace process in the lungs. No pleural effusions or pneumothorax. IMPRESSION: Progressive interstitial and patchy nodular airspace process. Electronically Signed   By: Marijo Sanes M.D.   On: 07/27/2019 06:42   Scheduled Meds: . apixaban  2.5 mg Oral BID  . Chlorhexidine Gluconate Cloth  6 each Topical Daily  . diltiazem  120 mg Oral Daily  . furosemide  40 mg Intravenous BID  . gabapentin  300 mg Oral  BID  . hydrALAZINE  50 mg Oral BID  . insulin aspart  0-5 Units Subcutaneous QHS  . insulin aspart  0-6 Units Subcutaneous TID WC  . insulin detemir  60 Units Subcutaneous QHS  . pantoprazole (PROTONIX) IV  40 mg Intravenous Q24H  . sodium chloride flush  3 mL Intravenous Q12H   Continuous Infusions: . sodium chloride    . levofloxacin (LEVAQUIN) IV Stopped (07/26/19 1005)    LOS: 4 days   Roxan Hockey, MD [facility]  If 7PM-7AM, please contact night-coverage [email or web address]  07/27/2019, 6:09 PM   Yasuko Lapage [place]

## 2019-07-27 NOTE — TOC Initial Note (Signed)
Transition of Care [facility]) - Initial/Assessment Note    Patient Details  Name: [name] MRN: [id number] Date of Birth: 21-Jan-1935  [facility] CM/SW Contact:    Shade Flood, LCSW Phone Number: 07/27/2019, 10:45 AM  Clinical Narrative:                  Pt admitted from home. She has family that live with her. PT recommending SNF rehab at dc. Discussed with pt who is agreeable. Provided SNF option choices to pt. Will refer as requested.  Expected Discharge Plan: Skilled Nursing Facility Barriers to Discharge: Continued Medical Work up   Patient Goals and CMS Choice Patient states their goals for this hospitalization and ongoing recovery are:: Get better CMS Medicare.gov Compare Post Acute Care list provided to:: Patient Choice offered to / list presented to : Patient  Expected Discharge Plan and Services Expected Discharge Plan: [place] In-house Referral: Clinical Social Work     Living arrangements for the past 2 months: [place]                                      Prior Living Arrangements/Services Living arrangements for the past 2 months: [place] Lives with:: Adult Children Patient language and need for interpreter reviewed:: Yes Do you feel safe going back to the place where you live?: Yes      Need for Family Participation in Patient Care: Yes (Comment) Care giver support system in place?: Yes (comment) Current home services: DME Criminal Activity/Legal Involvement Pertinent to Current Situation/Hospitalization: No - Comment as needed  Activities of Daily Living Home Assistive Devices/Equipment: Environmental consultant (specify type), Wheelchair ADL Screening (condition at time of admission) Patient's cognitive ability adequate to safely complete daily activities?: Yes Is the patient deaf or have difficulty hearing?: No Does the patient have difficulty seeing, even when wearing glasses/contacts?: No Does  the patient have difficulty concentrating, remembering, or making decisions?: No Patient able to express need for assistance with ADLs?: Yes Does the patient have difficulty dressing or bathing?: No Independently performs ADLs?: No Communication: Independent Dressing (OT): Independent Grooming: Independent Feeding: Independent Bathing: Independent Toileting: Independent In/Out Bed: Independent Walks in Home: Independent with device (comment) Does the patient have difficulty walking or climbing stairs?: No Weakness of Legs: Both Weakness of Arms/Hands: None  Permission Sought/Granted Permission sought to share information with : Chartered certified accountant granted to share information with : Yes, Verbal Permission Granted     Permission granted to share info w AGENCY: local snfs        Emotional Assessment Appearance:: Appears younger than stated age Attitude/Demeanor/Rapport: Engaged Affect (typically observed): Pleasant Orientation: : Oriented to Self, Oriented to Place, Oriented to  Time, Oriented to Situation Alcohol / Substance Use: Not Applicable Psych Involvement: No (comment)  Admission diagnosis:  Pancreatitis, acute [K85.90] Acute pulmonary edema ([place]) [J81.0] Acute pancreatitis, unspecified complication status, unspecified pancreatitis type [K85.90] Patient Active Problem List   Diagnosis Date Noted  . AKI (acute kidney injury) ([place]) 07/25/2019  . Acute respiratory failure with hypercapnia ([place]) 07/24/2019  . Pancreatitis, acute 07/23/2019  . Diabetes mellitus type 2 with complications ([place]) 19/50/9326  . New onset a-fib ([place]) 07/23/2019  . Chronic diastolic CHF (congestive heart failure) (HCC)/EF > 60 % 07/23/2019  . Shortness of breath 06/19/2019  . Acute diastolic CHF (congestive heart failure) ([place]) 07/08/2017  . Acute  respiratory failure with hypoxia ([place]) 07/08/2017  . Uncontrolled type 2 diabetes mellitus with diabetic nephropathy, with  long-term current use of insulin ([place]) 07/08/2017  . CKD (chronic kidney disease), stage IIIb 07/08/2017  . Essential hypertension 07/08/2017  . Hypertension 06/23/2017  . Prolapsed internal hemorrhoids, grade 3 06/25/2016  . Abdominal pain 05/08/2015  . Dysphagia, pharyngoesophageal phase   . Other hemorrhoids   . Diverticulosis of colon without hemorrhage   . GERD (gastroesophageal reflux disease) 02/28/2014  . Constipation 02/28/2014  . Esophageal dysphagia 02/28/2014  . Rectal bleeding 02/28/2014  . OSTEOARTHRITIS, KNEE, RIGHT, SEVERE 12/13/2008  . DEGENERATIVE DISC DISEASE, LUMBOSACRAL SPINE W/RADICULOPATHY 12/13/2008  . BACK PAIN 12/13/2008  . DIABETES 04/13/2007  . FRACTURE, FEMUR, INTERTROCHANTERIC REGION 04/13/2007   PCP:  [name], MD Pharmacy:   [facility], [place] [address] Phone: [phone] Fax: [phone]     Social Determinants of Health (SDOH) Interventions    Readmission Risk Interventions Readmission Risk Prevention Plan 07/27/2019  Transportation Screening Complete  Medication Review (RN CM) Complete  Some recent data might be hidden

## 2019-07-27 NOTE — Progress Notes (Signed)
Although pt has maintained O2 saturation of 92 or greater pt progressively throughout the night has had increased work of breathing and discomfort. Pt has hx of heart failure and is able to take POs at this time. Paged MD to receive order for lasix and discontinue IV fluids. Will adminter lasix, D/C fluids and continue to monitor.

## 2019-07-27 NOTE — Progress Notes (Signed)
Physical Therapy Treatment Patient Details Name: [name] MRN: [id number] DOB: [birth year] Today's Date: 07/27/2019    History of Present Illness [name]  is a 85 y.o. female with past medical history relevant for HTN, DM, CKD 3B, dCHF , chronic LBB and chronic lower extremity edema who presents to the ED with epigastric pain associated with nausea and vomiting    PT Comments    Patient demonstrates increased generalized strength for sitting up at bedside with head of bed raised demonstrating slow labored movement and had most difficulty scooting to EOB.  Patient limited to 5-6 short unsteady side steps at bedside requiring tactile assistance to move RLE due to weakness and fatigues easily on exertion with SpO2 dropping from 95% to 87% while on 2 LPM O2.  Patient tolerated sitting up in chair after therapy with her family member present in room - RN notified.  Patient will benefit from continued physical therapy in hospital and recommended venue below to increase strength, balance, endurance for safe ADLs and gait.   Follow Up Recommendations  SNF     Equipment Recommendations  None recommended by PT    Recommendations for Other Services       Precautions / Restrictions Precautions Precautions: Fall Restrictions Weight Bearing Restrictions: No    Mobility  Bed Mobility Overal bed mobility: Needs Assistance Bed Mobility: Supine to Sit     Supine to sit: Min assist;Mod assist;HOB elevated     General bed mobility comments: slow labored movement  Transfers Overall transfer level: Needs assistance Equipment used: Rolling walker (2 wheeled) Transfers: Sit to/from [facility] Sit to Stand: Mod assist Stand pivot transfers: Mod assist;Max assist       General transfer comment: very slow labored movement requiring much time to transfer to chair  Ambulation/Gait Ambulation/Gait assistance: Max assist Gait Distance (Feet): 4 Feet Assistive device:  Rolling walker (2 wheeled) Gait Pattern/deviations: Decreased step length - left;Decreased stance time - right;Decreased stride length Gait velocity: slow   General Gait Details: limited to very slow labored side steps requiring tactile assist to move RLE due to weakness, frequent buckling of knees, limited secondary to fatigue, weakness, and severe fall risk   Stairs             Wheelchair Mobility    Modified Rankin (Stroke Patients Only)       Balance Overall balance assessment: Needs assistance Sitting-balance support: Feet supported;No upper extremity supported Sitting balance-Leahy Scale: Fair Sitting balance - Comments: seated at EOB   Standing balance support: During functional activity;Bilateral upper extremity supported Standing balance-Leahy Scale: Poor Standing balance comment: using RW                            Cognition Arousal/Alertness: Awake/alert Behavior During Therapy: WFL for tasks assessed/performed Overall Cognitive Status: Within Functional Limits for tasks assessed                                        Exercises General Exercises - Lower Extremity Ankle Circles/Pumps: Seated;AROM;Strengthening;10 reps    General Comments        Pertinent Vitals/Pain Pain Assessment: Faces Faces Pain Scale: Hurts a little bit Pain Location: stomach Pain Descriptors / Indicators: Aching Pain Intervention(s): Limited activity within patient's tolerance;Monitored during session    Home Living  Prior Function            PT Goals (current goals can now be found in the care plan section) Acute Rehab PT Goals Patient Stated Goal: return home with family to assist after rehab PT Goal Formulation: With patient Time For Goal Achievement: 08/09/19 Potential to Achieve Goals: Good Progress towards PT goals: Progressing toward goals    Frequency    Min 3X/week      PT Plan Current plan  remains appropriate    Co-evaluation              AM-PAC PT "6 Clicks" Mobility   Outcome Measure  Help needed turning from your back to your side while in a flat bed without using bedrails?: A Little Help needed moving from lying on your back to sitting on the side of a flat bed without using bedrails?: A Little Help needed moving to and from a bed to a chair (including a wheelchair)?: A Lot Help needed standing up from a chair using your arms (e.g., wheelchair or bedside chair)?: A Lot Help needed to walk in hospital room?: A Lot Help needed climbing 3-5 steps with a railing? : Total 6 Click Score: 13    End of Session Equipment Utilized During Treatment: Oxygen Activity Tolerance: Patient tolerated treatment well;Patient limited by fatigue;Patient limited by pain Patient left: in chair;with call bell/phone within reach;with family/visitor present Nurse Communication: Mobility status PT Visit Diagnosis: Unsteadiness on feet (R26.81);Other abnormalities of gait and mobility (R26.89);Muscle weakness (generalized) (M62.81)     Time: 3729-0211 PT Time Calculation (min) (ACUTE ONLY): 30 min  Charges:  $Therapeutic Activity: 23-37 mins                     2:05 PM, 07/27/19 Lonell Grandchild, MPT Physical Therapist with [facility] 336 [phone] office [phone] mobile phone

## 2019-07-27 NOTE — Progress Notes (Signed)
**Note De-identified  Obfuscation** EKG complete and placed in patient chart 

## 2019-07-27 NOTE — Progress Notes (Signed)
Progress Note  Patient Name: SRAH AKE Date of Encounter: 07/27/2019  Primary Cardiologist: Kate Sable, MD  Subjective   Short of breath at rest, no coughing or chest pain.  States abdominal discomfort generally improved.  No nausea or emesis.  Inpatient Medications    Scheduled Meds: . apixaban  2.5 mg Oral BID  . Chlorhexidine Gluconate Cloth  6 each Topical Daily  . diltiazem  30 mg Oral Q6H  . gabapentin  300 mg Oral BID  . hydrALAZINE  50 mg Oral BID  . insulin aspart  0-5 Units Subcutaneous QHS  . insulin aspart  0-6 Units Subcutaneous TID WC  . insulin detemir  60 Units Subcutaneous QHS  . pantoprazole (PROTONIX) IV  40 mg Intravenous Q24H  . sodium chloride flush  3 mL Intravenous Q12H   Continuous Infusions: . sodium chloride    . levofloxacin (LEVAQUIN) IV Stopped (07/26/19 1005)   PRN Meds: sodium chloride, acetaminophen **OR** acetaminophen, labetalol, levalbuterol, polyethylene glycol, promethazine, sodium chloride flush, traZODone   Vital Signs    Vitals:   07/27/19 0502 07/27/19 0700 07/27/19 0744 07/27/19 0750  BP: (!) 171/75 (!) 171/70 (!) 157/72   Pulse:  77 81   Resp:  (!) 21 12   Temp: 98.1 F (36.7 C)   98.3 F (36.8 C)  TempSrc: Oral   Oral  SpO2:  96% 96%   Weight: 110.1 kg     Height:        Intake/Output Summary (Last 24 hours) at 07/27/2019 0818 Last data filed at 07/27/2019 0745 Gross per 24 hour  Intake 2095.76 ml  Output 3800 ml  Net -1704.24 ml   Filed Weights   07/24/19 2056 07/26/19 0625 07/27/19 0502  Weight: 108.5 kg 109.7 kg 110.1 kg    Telemetry    Normal sinus rhythm.  Personally reviewed.  Physical Exam   GEN:  Obese woman, short of breath at rest.   Neck:  Increased JVP. Cardiac: RRR, no gallop.  Respiratory:  Decreased breath sounds, no crackles anteriorly. GI: Nontender, bowel sounds present. MS:  Leg edema improving; No deformity. Neuro:  Nonfocal. Psych: Alert and oriented x 3. Normal  affect.  Labs    Chemistry Recent Labs  Lab 07/24/19 0419 07/25/19 0444 07/27/19 0426  NA 139 139 141  K 4.9 4.9 4.2  CL 105 107 111  CO2 23 24 24   GLUCOSE 386* 340* 153*  BUN 40* 58* 39*  CREATININE 2.03* 2.14* 1.53*  CALCIUM 8.1* 7.8* 7.9*  PROT 7.2 7.0 6.9  ALBUMIN 3.2* 3.0* 2.7*  AST 16 23 12*  ALT 16 27 21   ALKPHOS 46 44 43  BILITOT 0.5 0.4 0.5  GFRNONAA 22* 21* 31*  GFRAA 26* 24* 36*  ANIONGAP 11 8 6      Hematology Recent Labs  Lab 07/24/19 0419 07/25/19 0444 07/27/19 0426  WBC 8.8 10.7* 9.5  RBC 3.59* 3.40* 3.39*  HGB 10.0* 9.5* 9.5*  HCT 33.5* 31.4* 31.4*  MCV 93.3 92.4 92.6  MCH 27.9 27.9 28.0  MCHC 29.9* 30.3 30.3  RDW 16.8* 16.7* 16.5*  PLT 204 218 225    Cardiac Enzymes Recent Labs  Lab 07/23/19 1321 07/23/19 1526  TROPONINIHS 14 14    BNP Recent Labs  Lab 07/23/19 1321  BNP 104.0*     Radiology    DG Chest Port 1 View  Result Date: 07/27/2019 CLINICAL DATA:  Acute respiratory failure EXAM: PORTABLE CHEST 1 VIEW COMPARISON:  07/25/2019 FINDINGS: The  heart is mildly enlarged but stable. Stable prominent mediastinal and hilar contours. Progressive interstitial and patchy nodular airspace process in the lungs. No pleural effusions or pneumothorax. IMPRESSION: Progressive interstitial and patchy nodular airspace process. Electronically Signed   By: Marijo Sanes M.D.   On: 07/27/2019 06:42    Cardiac Studies   Echocardiogram 07/27/2019: 1. Left ventricular ejection fraction, by estimation, is 60 to 65%. The  left ventricle has normal function. The left ventrical has no regional  wall motion abnormalities. There is moderately increased left ventricular  hypertrophy. Left ventricular  diastolic parameters are consistent with Grade II diastolic dysfunction  (pseudonormalization). Elevated left atrial pressure.  2. Right ventricular systolic function is normal. The right ventricular  size is normal.  3. Left atrial size was severely  dilated.  4. The mitral valve is normal in structure and function. trivial mitral  valve regurgitation. No evidence of mitral stenosis.  5. The aortic valve is tricuspid. Aortic valve regurgitation is not  visualized. No aortic stenosis is present.  6. The inferior vena cava is normal in size with greater than 50%  respiratory variability, suggesting right atrial pressure of 3 mmHg.  Lexiscan Myoview 07/18/2019:  Nuclear stress EF: 55%.  The left ventricular ejection fraction is normal (55-65%).  There was no ST segment deviation noted during stress.  There is a medium defect of moderate severity present in the basal anterior, basal anteroseptal, mid anterior and mid anteroseptal location. The defect is non-reversible and consistent with breast attenuation artifact.  This is a low risk study.  Patient Profile     85 y.o. female with a history of chronic diastolic heart failure, previous stroke, hypertension, type 2 diabetes mellitus, left branch block, and CKD stage III. She is currently admitted with acute pancreatitis and newly documented, persistent atrial fibrillation.  Assessment & Plan    1.  Persistent atrial fibrillation with CHA2DS2-VASc score of 8, newly documented.  She has converted back to sinus rhythm, will obtain a follow-up ECG.  She continues on Eliquis and short acting Cardizem.  2.  Acute on chronic diastolic heart failure.  Weight has increased with positive fluid balance in the setting treatment of acute pain.  Tightness.  She was given a dose of IV Lasix yesterday with some diuresis.  Still short of breath with increased work of breathing.  3.  Acute on chronic renal failure with CKD stage IIIb at baseline.  Creatinine has come down to 1.53, potassium normal.  Start Lasix 40 mg IV twice daily for now with follow-up urine output and BMET in the morning.  Continue current dose of Eliquis and change to Cardizem CD 120 mg daily.  Obtain follow-up  ECG.  Signed, Rozann Lesches, MD  07/27/2019, 8:18 AM

## 2019-07-28 ENCOUNTER — Inpatient Hospital Stay (HOSPITAL_COMMUNITY): Payer: Medicare HMO

## 2019-07-28 DIAGNOSIS — K859 Acute pancreatitis without necrosis or infection, unspecified: Principal | ICD-10-CM

## 2019-07-28 LAB — GLUCOSE, CAPILLARY
Glucose-Capillary: 124 mg/dL — ABNORMAL HIGH (ref 70–99)
Glucose-Capillary: 166 mg/dL — ABNORMAL HIGH (ref 70–99)
Glucose-Capillary: 185 mg/dL — ABNORMAL HIGH (ref 70–99)
Glucose-Capillary: 228 mg/dL — ABNORMAL HIGH (ref 70–99)

## 2019-07-28 MED ORDER — METHYLPREDNISOLONE SODIUM SUCC 40 MG IJ SOLR
40.0000 mg | Freq: Once | INTRAMUSCULAR | Status: AC
  Administered 2019-07-28: 40 mg via INTRAVENOUS
  Filled 2019-07-28: qty 1

## 2019-07-28 MED ORDER — FUROSEMIDE 10 MG/ML IJ SOLN
60.0000 mg | Freq: Once | INTRAMUSCULAR | Status: AC
  Administered 2019-07-28: 60 mg via INTRAVENOUS
  Filled 2019-07-28: qty 6

## 2019-07-28 NOTE — Progress Notes (Signed)
PROGRESS NOTE    SELBY SLOVACEK  UYQ:[id number] DOB: 01-20-1935 DOA: 07/23/2019 PCP: [name], MD    Brief Narrative:  85 year old female with a history of hypertension, diabetes, chronic kidney disease stage III, diastolic heart failure, admitted to the hospital with acute pancreatitis.  She was also noted to have new onset atrial fibrillation.  Started on IV hydration.  Started on Cardizem for rate control and Eliquis for anticoagulation.  She developed acute kidney injury on chronic kidney disease.  Shortly after admission, she was noted to be increasingly somnolent.  Blood gas was checked and she was noted to to have increased pCO2.  She was transferred to stepdown unit and started on BiPAP.  Assessment & Plan:   Principal Problem:   Pancreatitis, acute Active Problems:   New onset a-fib ([place])   Uncontrolled type 2 diabetes mellitus with diabetic nephropathy, with long-term current use of insulin (HCC)   Diabetes mellitus type 2 with complications (HCC)   Chronic diastolic CHF (congestive heart failure) (HCC)/EF > 60 %   Acute respiratory failure with hypoxemia (HCC)   CKD (chronic kidney disease), stage IIIb   Essential hypertension   Acute respiratory failure with hypercapnia (HCC)   AKI (acute kidney injury) ([place])   Acute pulmonary edema (HCC)   1)Acute Pancreatitis.  She is status post prior cholecystectomy. No calcified intraductal gallstones identified on imaging.  No history of alcohol use.  She has been hydrated with IV fluids and clinically improving. -Continue full liquid diet  2)New onset atrial fibrillation.  CHADSVASc of 8.  She is currently on oral diltiazem for rate control.   cardiology input appreciated, continue Eliquis 2.5 mg twice daily for stroke prophylaxis  -Appears back in sinus rhythm  3) E. coli UTI ----stopped Rocephin, continue Levaquin per sensitivity report   4)Acute respiratory failure with hypercarbia--- off opiates, pulmonology consult from Dr.  Melvyn Novas appreciated, holding off on possible thoracentesis--- patient developed further respiratory distress with increased work of breathing on 07/27/2019 through 07/28/2019, improved on BiPAP. -Chest x-ray suggestive of pulmonary edema, additional IV Lasix given  5)Acute on chronic diastolic congestive heart failure-right-sided heart failure symptoms noted, additional diuresis as above in #4   6)Acute kidney injury on chronic kidney disease stage III.  Baseline creatinine 1.4.  Creatinine initially bumped up since admission.  Possibly related to acute pancreatitis.  She also did receive IV contrast on admission. --IV Lasix per cardiology, watch renal function closely with diuresis  -Creatinine back down to 1.53 from a peak of 2.14  7)Diabetes---  She is chronically on high-dose Levemir. --Monitor Levemir dosage closely as oral intake improves may increase insulin dosage  8)HTN--- continue Cardizem and hydralazin  9)Abnormal imaging findings. Imaging findings suggest thyroid nodule and possible --- outpatient follow-up with PCP for further investigation/work-up strongly advised  10)Morbid obesity.  BMI 41.  Would benefit from weight loss.  11) generalized weakness/deconditioning--PT eval appreciated recommends SNF rehab   DVT prophylaxis: Eliquis Code Status: Full code Family Communication: Discussed with patient's daughter [name] at bedside  Disposition Plan:  --Decompensated respiratory status requiring prolonged use of BiPAP and additional IV Lasix/diuresis -Patient is to remain in ICU, not medically ready for discharge at this time -Anticipate discharge to SNF when medically improved  Consultants:   Cardiology/pulmonology  Procedures:     Antimicrobials:   Ceftriaxone 3/7 > thru 3/9  -Levaquin started on 07/26/2019   Subjective: -Shortness of breath, increased work of breathing, denies chest pains, nausea persist  Objective: Vitals:  07/28/19 1530 07/28/19 1600  07/28/19 1700 07/28/19 1800  BP:  (!) 160/65 133/65 (!) 159/71  Pulse: 76 73 74 81  Resp: 20 18 19 19   Temp:  98.6 F (37 C)    TempSrc:  Oral    SpO2: 96% 96% 96% 95%  Weight:      Height:        Intake/Output Summary (Last 24 hours) at 07/28/2019 1856 Last data filed at 07/28/2019 1821 Gross per 24 hour  Intake 3 ml  Output 3500 ml  Net -3497 ml   Filed Weights   07/26/19 0625 07/27/19 0502 07/28/19 0600  Weight: 109.7 kg 110.1 kg 106.8 kg    Examination:  General exam: Awake, oriented x3  respiratory system: Diminished, increased work of breathing, scattered rales,  Cardiovascular system-regular, no murmurs, rubs, gallops. Gastrointestinal system: Abdomen is nondistended, soft and diffusely tender. Normal bowel sounds heard. Central nervous system: Generalized weakness, no focal neurological deficits. Extremities: 1+ edema bilaterally Skin: No rashes, lesions or ulcers Psychiatry: Affect is appropriate  Data Reviewed:   CBC: Recent Labs  Lab 07/23/19 1321 07/24/19 0419 07/25/19 0444 07/27/19 0426  WBC 11.9* 8.8 10.7* 9.5  NEUTROABS 9.7*  --   --   --   HGB 11.1* 10.0* 9.5* 9.5*  HCT 36.4 33.5* 31.4* 31.4*  MCV 91.7 93.3 92.4 92.6  PLT 247 204 218 034   Basic Metabolic Panel: Recent Labs  Lab 07/23/19 1321 07/23/19 1526 07/24/19 0419 07/25/19 0444 07/27/19 0426  NA 141  --  139 139 141  K 4.1  --  4.9 4.9 4.2  CL 106  --  105 107 111  CO2 27  --  23 24 24   GLUCOSE 203*  --  386* 340* 153*  BUN 32*  --  40* 58* 39*  CREATININE 1.54*  --  2.03* 2.14* 1.53*  CALCIUM 8.7*  --  8.1* 7.8* 7.9*  MG  --  2.2  --   --   --    GFR: Estimated Creatinine Clearance: 33.2 mL/min (A) (by C-G formula based on SCr of 1.53 mg/dL (H)). Liver Function Tests: Recent Labs  Lab 07/23/19 1321 07/24/19 0419 07/25/19 0444 07/27/19 0426  AST 17 16 23  12*  ALT 16 16 27 21   ALKPHOS 55 46 44 43  BILITOT 0.6 0.5 0.4 0.5  PROT 8.0 7.2 7.0 6.9  ALBUMIN 3.6 3.2* 3.0*  2.7*   Recent Labs  Lab 07/23/19 1321 07/24/19 0419 07/25/19 0444  LIPASE 2,264* 1,489* 150*   No results for input(s): AMMONIA in the last 168 hours. Coagulation Profile: No results for input(s): INR, PROTIME in the last 168 hours. Cardiac Enzymes: No results for input(s): CKTOTAL, CKMB, CKMBINDEX, TROPONINI in the last 168 hours. BNP (last 3 results) No results for input(s): PROBNP in the last 8760 hours. HbA1C: No results for input(s): HGBA1C in the last 72 hours. CBG: Recent Labs  Lab 07/27/19 1621 07/27/19 2145 07/28/19 0754 07/28/19 1143 07/28/19 1618  GLUCAP 183* 142* 124* 166* 185*   Lipid Profile: No results for input(s): CHOL, HDL, LDLCALC, TRIG, CHOLHDL, LDLDIRECT in the last 72 hours. Thyroid Function Tests: No results for input(s): TSH, T4TOTAL, FREET4, T3FREE, THYROIDAB in the last 72 hours. Anemia Panel: No results for input(s): VITAMINB12, FOLATE, FERRITIN, TIBC, IRON, RETICCTPCT in the last 72 hours. Sepsis Labs: No results for input(s): PROCALCITON, LATICACIDVEN in the last 168 hours.  Recent Results (from the past 240 hour(s))  SARS CORONAVIRUS 2 (TAT 6-24 HRS)  Nasopharyngeal Nasopharyngeal Swab     Status: None   Collection Time: 07/23/19  1:32 PM   Specimen: Nasopharyngeal Swab  Result Value Ref Range Status   SARS Coronavirus 2 NEGATIVE NEGATIVE Final    Comment: (NOTE) SARS-CoV-2 target nucleic acids are NOT DETECTED. The SARS-CoV-2 RNA is generally detectable in upper and lower respiratory specimens during the acute phase of infection. Negative results do not preclude SARS-CoV-2 infection, do not rule out co-infections with other pathogens, and should not be used as the sole basis for treatment or other patient management decisions. Negative results must be combined with clinical observations, patient history, and epidemiological information. The expected result is Negative. Fact Sheet for  Patients: SugarRoll.be Fact Sheet for Healthcare Providers: https://www.woods-mathews.com/ This test is not yet approved or cleared by the [place] FDA and  has been authorized for detection and/or diagnosis of SARS-CoV-2 by FDA under an Emergency Use Authorization (EUA). This EUA will remain  in effect (meaning this test can be used) for the duration of the COVID-19 declaration under Section 56 4(b)(1) of the Act, 21 U.S.C. section 360bbb-3(b)(1), unless the authorization is terminated or revoked sooner. Performed at [facility], [place] [address] [place], [place] [address]   Urine culture     Status: Abnormal   Collection Time: 07/23/19  2:26 PM   Specimen: Urine, Clean Catch  Result Value Ref Range Status   Specimen Description   Final    URINE, CLEAN CATCH Performed at [facility], [address], [place], [place] [address]    Special Requests   Final    NONE Performed at [facility], [address], [place], [place] [address]    Culture >=100,000 COLONIES/mL ESCHERICHIA COLI (A)  Final   Report Status 07/26/2019 FINAL  Final   Organism ID, Bacteria ESCHERICHIA COLI (A)  Final      Susceptibility   Escherichia coli - MIC*    AMPICILLIN >=32 RESISTANT Resistant     CEFAZOLIN >=64 RESISTANT Resistant     CEFTRIAXONE 2 INTERMEDIATE Intermediate     CIPROFLOXACIN <=0.25 SENSITIVE Sensitive     GENTAMICIN <=1 SENSITIVE Sensitive     IMIPENEM <=0.25 SENSITIVE Sensitive     NITROFURANTOIN <=16 SENSITIVE Sensitive     TRIMETH/SULFA <=20 SENSITIVE Sensitive     AMPICILLIN/SULBACTAM >=32 RESISTANT Resistant     PIP/TAZO 8 SENSITIVE Sensitive     * >=100,000 COLONIES/mL ESCHERICHIA COLI  MRSA PCR Screening     Status: None   Collection Time: 07/24/19  8:44 PM   Specimen: Nasal Mucosa; Nasopharyngeal  Result Value Ref Range Status   MRSA by PCR NEGATIVE NEGATIVE Final    Comment:        The GeneXpert MRSA Assay (FDA approved  for NASAL specimens only), is one component of a comprehensive MRSA colonization surveillance program. It is not intended to diagnose MRSA infection nor to guide or monitor treatment for MRSA infections. Performed at [facility], [address], [place], [place] [address]      Radiology Studies: DG CHEST PORT 1 VIEW  Result Date: 07/28/2019 CLINICAL DATA:  Worsening shortness of breath. Chronic kidney disease. Diabetes and hypertension. EXAM: PORTABLE CHEST 1 VIEW COMPARISON:  07/27/2019 FINDINGS: Cardiomegaly stable. Diffuse interstitial infiltrates show no significant change. Increased opacity at both lung bases likely due to increased atelectasis and small pleural effusions. IMPRESSION: 1. Increased bibasilar atelectasis and probable small pleural effusions. 2. Stable cardiomegaly and diffuse interstitial infiltrates, suspicious for pulmonary edema. Electronically Signed  By: Marlaine Hind M.D.   On: 07/28/2019 13:43   DG Chest Port 1 View  Result Date: 07/27/2019 CLINICAL DATA:  Acute respiratory failure EXAM: PORTABLE CHEST 1 VIEW COMPARISON:  07/25/2019 FINDINGS: The heart is mildly enlarged but stable. Stable prominent mediastinal and hilar contours. Progressive interstitial and patchy nodular airspace process in the lungs. No pleural effusions or pneumothorax. IMPRESSION: Progressive interstitial and patchy nodular airspace process. Electronically Signed   By: Marijo Sanes M.D.   On: 07/27/2019 06:42   Scheduled Meds: . apixaban  2.5 mg Oral BID  . Chlorhexidine Gluconate Cloth  6 each Topical Daily  . diltiazem  120 mg Oral Daily  . furosemide  40 mg Intravenous BID  . gabapentin  300 mg Oral BID  . hydrALAZINE  50 mg Oral BID  . insulin aspart  0-5 Units Subcutaneous QHS  . insulin aspart  0-6 Units Subcutaneous TID WC  . insulin detemir  60 Units Subcutaneous QHS  . levalbuterol  0.63 mg Nebulization Q6H WA  . pantoprazole (PROTONIX) IV  40 mg Intravenous Q24H  . sodium  chloride flush  3 mL Intravenous Q12H   Continuous Infusions: . sodium chloride    . levofloxacin (LEVAQUIN) IV 750 mg (07/28/19 0914)    LOS: 5 days   Roxan Hockey, MD [facility]  If 7PM-7AM, please contact night-coverage [email or web address]  07/28/2019, 6:56 PM   Jameica Couts [place]

## 2019-07-28 NOTE — Progress Notes (Signed)
Progress Note  Patient Name: [name] Date of Encounter: 07/28/2019  Primary Cardiologist: Kate Sable, MD  Subjective   States that she slept well last night, did use BiPAP.  Increased work of breathing noted this morning with upper airway wheezing.  Tolerating p.o. intake, mild abdominal discomfort.  Inpatient Medications    Scheduled Meds: . apixaban  2.5 mg Oral BID  . Chlorhexidine Gluconate Cloth  6 each Topical Daily  . diltiazem  120 mg Oral Daily  . furosemide  40 mg Intravenous BID  . gabapentin  300 mg Oral BID  . hydrALAZINE  50 mg Oral BID  . insulin aspart  0-5 Units Subcutaneous QHS  . insulin aspart  0-6 Units Subcutaneous TID WC  . insulin detemir  60 Units Subcutaneous QHS  . levalbuterol  0.63 mg Nebulization Q6H WA  . pantoprazole (PROTONIX) IV  40 mg Intravenous Q24H  . sodium chloride flush  3 mL Intravenous Q12H   Continuous Infusions: . sodium chloride    . levofloxacin (LEVAQUIN) IV Stopped (07/26/19 1005)   PRN Meds: sodium chloride, acetaminophen **OR** acetaminophen, ipratropium-albuterol, labetalol, polyethylene glycol, promethazine, sodium chloride flush, traZODone   Vital Signs    Vitals:   07/28/19 0300 07/28/19 0400 07/28/19 0500 07/28/19 0600  BP: (!) 144/57 (!) 171/59 (!) 167/63 (!) 172/59  Pulse: 75 78 80 72  Resp: 19 (!) 24 18 19   Temp:  98.3 F (36.8 C)    TempSrc:  Oral    SpO2: 96% 96% 100% 98%  Weight:    106.8 kg  Height:        Intake/Output Summary (Last 24 hours) at 07/28/2019 0821 Last data filed at 07/28/2019 0808 Gross per 24 hour  Intake 3 ml  Output 3150 ml  Net -3147 ml   Filed Weights   07/26/19 0625 07/27/19 0502 07/28/19 0600  Weight: 109.7 kg 110.1 kg 106.8 kg    Telemetry    Normal sinus rhythm.  Personally reviewed.  ECG    I personally reviewed her tracing from 07/27/2019 which showed sinus rhythm with left bundle branch block.  Physical Exam   GEN:  Obese woman, increased work  of breathing at rest.   Cardiac: RRR, no gallop.  Respiratory:  Decreased breath sounds at the bases, no crackles anteriorly.  Mild upper airway wheeze. GI: Nontender, bowel sounds present. MS:  Edema improving; No deformity. Neuro:  Nonfocal. Psych: Alert and oriented x 3. Normal affect.  Labs    Chemistry Recent Labs  Lab 07/24/19 0419 07/25/19 0444 07/27/19 0426  NA 139 139 141  K 4.9 4.9 4.2  CL 105 107 111  CO2 23 24 24   GLUCOSE 386* 340* 153*  BUN 40* 58* 39*  CREATININE 2.03* 2.14* 1.53*  CALCIUM 8.1* 7.8* 7.9*  PROT 7.2 7.0 6.9  ALBUMIN 3.2* 3.0* 2.7*  AST 16 23 12*  ALT 16 27 21   ALKPHOS 46 44 43  BILITOT 0.5 0.4 0.5  GFRNONAA 22* 21* 31*  GFRAA 26* 24* 36*  ANIONGAP 11 8 6      Hematology Recent Labs  Lab 07/24/19 0419 07/25/19 0444 07/27/19 0426  WBC 8.8 10.7* 9.5  RBC 3.59* 3.40* 3.39*  HGB 10.0* 9.5* 9.5*  HCT 33.5* 31.4* 31.4*  MCV 93.3 92.4 92.6  MCH 27.9 27.9 28.0  MCHC 29.9* 30.3 30.3  RDW 16.8* 16.7* 16.5*  PLT 204 218 225    Cardiac Enzymes Recent Labs  Lab 07/23/19 1321 07/23/19 1526  TROPONINIHS  14 14    BNP Recent Labs  Lab 07/23/19 1321  BNP 104.0*     Radiology    DG Chest Port 1 View  Result Date: 07/27/2019 CLINICAL DATA:  Acute respiratory failure EXAM: PORTABLE CHEST 1 VIEW COMPARISON:  07/25/2019 FINDINGS: The heart is mildly enlarged but stable. Stable prominent mediastinal and hilar contours. Progressive interstitial and patchy nodular airspace process in the lungs. No pleural effusions or pneumothorax. IMPRESSION: Progressive interstitial and patchy nodular airspace process. Electronically Signed   By: Marijo Sanes M.D.   On: 07/27/2019 06:42    Cardiac Studies   Echocardiogram 07/27/2019: 1. Left ventricular ejection fraction, by estimation, is 60 to 65%. The  left ventricle has normal function. The left ventrical has no regional  wall motion abnormalities. There is moderately increased left ventricular   hypertrophy. Left ventricular  diastolic parameters are consistent with Grade II diastolic dysfunction  (pseudonormalization). Elevated left atrial pressure.  2. Right ventricular systolic function is normal. The right ventricular  size is normal.  3. Left atrial size was severely dilated.  4. The mitral valve is normal in structure and function. trivial mitral  valve regurgitation. No evidence of mitral stenosis.  5. The aortic valve is tricuspid. Aortic valve regurgitation is not  visualized. No aortic stenosis is present.  6. The inferior vena cava is normal in size with greater than 50%  respiratory variability, suggesting right atrial pressure of 3 mmHg.  Lexiscan Myoview 07/18/2019:  Nuclear stress EF: 55%.  The left ventricular ejection fraction is normal (55-65%).  There was no ST segment deviation noted during stress.  There is a medium defect of moderate severity present in the basal anterior, basal anteroseptal, mid anterior and mid anteroseptal location. The defect is non-reversible and consistent with breast attenuation artifact.  This is a low risk study.  Patient Profile     85 y.o. female with a history of chronic diastolic heart failure, previous stroke, hypertension, type 2 diabetes mellitus, left branch block, and CKD stage III. She is currently admitted with acute pancreatitis and newly documented, persistent atrial fibrillation.  Assessment & Plan    1.  Newly documented, persistent atrial fibrillation with CHA2DS2-VASc score of 8.  She remains in sinus rhythm having spontaneously converted.  Follow-up ECG reviewed.  She remains on Eliquis and has been converted to Cardizem CD 120 mg daily.  2.  Acute on chronic diastolic heart failure.  Weight coming down and net output of 4000 cc last 24 hours on IV Lasix.  3.  Acute on chronic renal failure with CKD stage IIIb at baseline.  Last creatinine 1.53, follow-up pending.  Follow-up BMET for reassessment of  renal function and potassium.  Weight coming down toward prior outpatient baseline.  Suspect will need to convert to oral Demadex possibly within the next 24 hours (40 mg dose to start).  Continue Eliquis with adjustments based on follow-up creatinine, tolerating present dose of Cardizem CD.  May also need to follow-up chest imaging, question multifactorial process with increased work of breathing and not all related to edema given substantial diuresis and decreasing weight.  Signed, Rozann Lesches, MD  07/28/2019, 8:21 AM

## 2019-07-28 NOTE — Progress Notes (Signed)
Pt placed on BIPAP due to increased WOB. O2 saturation is good. Pt states that she is not SOB but does have increased use of accessory muscles.  Daughter is at bedside and is agreeable to returning pt to BIPAP

## 2019-07-28 NOTE — Care Management Important Message (Signed)
Important Message  Patient Details  Name: [name] MRN: [id number] Date of Birth: 1934-06-19   Medicare Important Message Given:  Gae Dry copy was placed at bedside for family member)     Tommy Medal 07/28/2019, 1:53 PM

## 2019-07-28 NOTE — Progress Notes (Signed)
PULMONARY / CRITICAL CARE MEDICINE   NAME:  [name], MRN:  [id number], DOB:  1934/10/07, LOS: 5 ADMISSION DATE:  07/23/2019, CONSULTATION DATE:  3/9 REFERRING MD:  [facility], CHIEF COMPLAINT:  resp distress  BRIEF HISTORY:     66 yobf never smoker admit 3/7 with acute pancreatitis ? Etiology with new AF and rx with eliquis/fluids /cardizem with progressive hypersomnolence and found to be hypercarbic  am 3/9 and moved to ICU for bipap and pccm service asked to see.    HISTORY OF PRESENT ILLNESS   46 yobf with HTN, DM, CKD 3B, dCHF , chronic LBB and chronic lower extremity edema who presented to the ED 3/7  with epigastric pain associated with nausea and vomiting and lipase > 2200 ? Etiology and acute renal insufficiency /rapid afib   Was moved to icu for hypersomnolence / hypercabia and improved and requested bipap off/ denies ongoing nausea chest/abd pain or sob.   SIGNIFICANT PAST MEDICAL HISTORY    . Arthritis   . Chronic diastolic heart failure ([place])   . CKD (chronic kidney disease)   . CVA (cerebral infarction)   . Diabetes mellitus without complication ([place])   . GERD (gastroesophageal reflux disease)   . Hemorrhoids   . Hypertension   . Stroke [facility])      SIGNIFICANT EVENTS:  3/9 transferred to ICU for Bipap due to hypercarbia 3/10 converted back to NSR  STUDIES:   Echo 06/29/19:  Grade II diastolic dysfunction with severe LAE  Cta Chest 3/7  :1. No evidence of acute pulmonary artery filling defect to suggest pulmonary embolism. 2. Features most suggestive of likely cardiogenic pulmonary edema with small bilateral effusions and reflux of contrast into the hepatic veins which can suggest some right-sided heart dysfunction or elevated right heart pressures. 3. Borderline enlarged low-attenuation mediastinal and hilar nodes, likely reactive/edematous. CTabd 3/7 1. Mild diffuse peripancreatic inflammation compatible with acute pancreatitis particularly given the  setting of a elevated lipase. No evidence of pancreatic necrosis, significant free fluid or organized pancreatic collections. Paraduodenal inflammation is likely reactive. 2. Mild pancreatic ductal dilatation, can be related to the acute pancreatic process. Attention on follow-up imaging is recommended. If persistently dilated, MRCP could be obtained.   CULTURES:  UC 3/7  100k e coli intermediate to rocephin / sensitive to cipro  Covid 19 pcr  3/7 neg  MRSA PCR 3/8 neg    ANTIBIOTICS:   Rocephin 3/7 >>>3/10 Levaquin 3/10 >>>  LINES/TUBES:     CONSULTANTS:  PCCM 3/9  Cardiology 3/9  Scheduled Meds: . apixaban  2.5 mg Oral BID  . Chlorhexidine Gluconate Cloth  6 each Topical Daily  . diltiazem  120 mg Oral Daily  . furosemide  40 mg Intravenous BID  . gabapentin  300 mg Oral BID  . hydrALAZINE  50 mg Oral BID  . insulin aspart  0-5 Units Subcutaneous QHS  . insulin aspart  0-6 Units Subcutaneous TID WC  . insulin detemir  60 Units Subcutaneous QHS  . levalbuterol  0.63 mg Nebulization Q6H WA  . pantoprazole (PROTONIX) IV  40 mg Intravenous Q24H  . sodium chloride flush  3 mL Intravenous Q12H   Continuous Infusions: . sodium chloride    . levofloxacin (LEVAQUIN) IV 750 mg (07/28/19 0914)   PRN Meds:.sodium chloride, acetaminophen **OR** acetaminophen, ipratropium-albuterol, labetalol, polyethylene glycol, promethazine, sodium chloride flush, traZODone   SUBJECTIVE:   On BiPAP overnight for 'wob' Taken off this morning and  On 2 liters nasal cannula  Afebrile Denies abd pain  CONSTITUTIONAL: BP (!) 159/63   Pulse 80   Temp 98.7 F (37.1 C) (Oral)   Resp 19   Ht 5\' 4"  (1.626 m)   Wt 106.8 kg   SpO2 96%   BMI 40.42 kg/m    Intake/Output Summary (Last 24 hours) at 07/28/2019 1219 Last data filed at 07/28/2019 0092 Gross per 24 hour  Intake 3 ml  Output 3150 ml  Net -3147 ml       FiO2 (%):  [28 %] 28 %  PHYSICAL EXAM: General:  Elderly bf on 2L  [place] Neuro:  Alert/ approp HEENT: neck supple, no jvd  Cardiovascular:  RRR nSR on monitor Lungs:  Decreased bs , no rhonchi, mild accessory muscle use RR 25 Abdomen:  Mod distended, non tender Musculoskeletal: warm with severe venous stasis ? Early elephantiasis Skin: no rash / skin breakdown   CXR 3/11 bilateral interstitial infiltrates noted with cardiomegaly    ASSESSMENT AND PLAN   1) acute hypoxemic/hypercarbic resp failure in never smoker with pancreatitis with prior use of opioids on hold and was doing better but appears vol overloaded on cxr  ? Underlying OSA - diurese as possible -BiPAP prn only , WOB related to abdominal process, expect to improve -sleep study as outpt given hypercarbia -may consider empiric CPAP on discharge depending on course   2) Atrial fib with diastolic dysfunction on cardizem/doac - cards following   3) Acute pancreatitis ? Etiology s/p remote cholecystectomy s obstruction apparent  - w/u ? GI ? But apears to be resolving s obvious complication  4) AKI s/p IV contrast/ 3rd spacing due to sirs from pancreatits Lab Results  Component Value Date   CREATININE 1.53 (H) 07/27/2019   CREATININE 2.14 (H) 07/25/2019   CREATININE 2.03 (H) 07/24/2019  improving UO & cr with lasix   5) E coli UTI - changed to levaquin   PCCM will see again Monday if needed depending on course over weekend  Kara Mead MD. FCCP. [facility]  If no response to pager , please call [phone]   07/28/2019

## 2019-07-28 NOTE — Progress Notes (Signed)
Dr. Humphrey Rolls at bedside to assess patient. Agrees that patient should stay in the ICU. Also gave order for one time dose of ativan and to use bipap tonight.

## 2019-07-28 NOTE — TOC Progression Note (Signed)
Transition of Care [facility]) - Progression Note    Patient Details  Name: [name] MRN: [id number] Date of Birth: 1934-12-22  Transition of Care [facility]) CM/SW Contact  Shade Flood, LCSW Phone Number: 07/28/2019, 1:31 PM  Clinical Narrative:     TOC following. MD anticipating dc on Saturday 3/13. Updated Zanette at [employer]. She states they can accept pt to Room B14 tomorrow. Zanette checked with the administrator and pt does not need a new covid test prior to dc. Per Zanette, report can be called to [phone] ask for [facility]. [employer] insurance Josem Kaufmann was started and since they still have auth waiver in effect, pt is able to transfer any time.  Weekend TOC will follow.  Expected Discharge Plan: [place] Barriers to Discharge: Continued Medical Work up  Expected Discharge Plan and Services Expected Discharge Plan: [place] In-house Referral: Clinical Social Work     Living arrangements for the past 2 months: [place]                                       Social Determinants of Health (SDOH) Interventions    Readmission Risk Interventions Readmission Risk Prevention Plan 07/27/2019  Transportation Screening Complete  Medication Review (RN CM) Complete  Some recent data might be hidden

## 2019-07-28 NOTE — Progress Notes (Signed)
Pt has rested really well all night. She has left bipap in place. Continuing to monitor

## 2019-07-28 NOTE — Progress Notes (Signed)
PT Cancellation Note  Patient Details Name: [name] MRN: [id number] DOB: [birth year]   Cancelled Treatment:    Reason Eval/Treat Not Completed: Medical issues which prohibited therapy.  Patient on BiPAP due to difficulty breathing and physical therapy held per RN request.   3:02 PM, 07/28/19 Lonell Grandchild, MPT Physical Therapist with [facility] 336 [phone] office [phone] mobile phone

## 2019-07-29 LAB — CBC
HCT: 29.3 % — ABNORMAL LOW (ref 36.0–46.0)
Hemoglobin: 9.2 g/dL — ABNORMAL LOW (ref 12.0–15.0)
MCH: 28.3 pg (ref 26.0–34.0)
MCHC: 31.4 g/dL (ref 30.0–36.0)
MCV: 90.2 fL (ref 80.0–100.0)
Platelets: 236 10*3/uL (ref 150–400)
RBC: 3.25 MIL/uL — ABNORMAL LOW (ref 3.87–5.11)
RDW: 16.1 % — ABNORMAL HIGH (ref 11.5–15.5)
WBC: 9 10*3/uL (ref 4.0–10.5)
nRBC: 0 % (ref 0.0–0.2)

## 2019-07-29 LAB — GLUCOSE, CAPILLARY
Glucose-Capillary: 175 mg/dL — ABNORMAL HIGH (ref 70–99)
Glucose-Capillary: 225 mg/dL — ABNORMAL HIGH (ref 70–99)
Glucose-Capillary: 248 mg/dL — ABNORMAL HIGH (ref 70–99)
Glucose-Capillary: 324 mg/dL — ABNORMAL HIGH (ref 70–99)

## 2019-07-29 LAB — BASIC METABOLIC PANEL
Anion gap: 7 (ref 5–15)
BUN: 42 mg/dL — ABNORMAL HIGH (ref 8–23)
CO2: 28 mmol/L (ref 22–32)
Calcium: 8.4 mg/dL — ABNORMAL LOW (ref 8.9–10.3)
Chloride: 104 mmol/L (ref 98–111)
Creatinine, Ser: 1.62 mg/dL — ABNORMAL HIGH (ref 0.44–1.00)
GFR calc Af Amer: 34 mL/min — ABNORMAL LOW (ref >60)
GFR calc non Af Amer: 29 mL/min — ABNORMAL LOW (ref >60)
Glucose, Bld: 216 mg/dL — ABNORMAL HIGH (ref 70–99)
Potassium: 4.2 mmol/L (ref 3.5–5.1)
Sodium: 139 mmol/L (ref 135–145)

## 2019-07-29 MED ORDER — POLYETHYLENE GLYCOL 3350 17 G PO PACK
17.0000 g | PACK | Freq: Every day | ORAL | Status: DC
  Administered 2019-07-29 – 2019-07-31 (×3): 17 g via ORAL
  Filled 2019-07-29 (×3): qty 1

## 2019-07-29 MED ORDER — LEVALBUTEROL HCL 0.63 MG/3ML IN NEBU: 0.6300 mg | INHALATION_SOLUTION | Freq: Four times a day (QID) | RESPIRATORY_TRACT | Status: DC | PRN

## 2019-07-29 MED ORDER — PANTOPRAZOLE SODIUM 40 MG PO TBEC
40.0000 mg | DELAYED_RELEASE_TABLET | Freq: Every day | ORAL | Status: DC
  Administered 2019-07-29 – 2019-07-31 (×3): 40 mg via ORAL
  Filled 2019-07-29 (×3): qty 1

## 2019-07-29 MED ORDER — IPRATROPIUM-ALBUTEROL 0.5-2.5 (3) MG/3ML IN SOLN
3.0000 mL | Freq: Two times a day (BID) | RESPIRATORY_TRACT | Status: DC
  Administered 2019-07-29 (×2): 3 mL via RESPIRATORY_TRACT
  Filled 2019-07-29: qty 3

## 2019-07-29 MED ORDER — PREDNISONE 20 MG PO TABS
50.0000 mg | ORAL_TABLET | Freq: Once | ORAL | Status: AC
  Administered 2019-07-29: 50 mg via ORAL
  Filled 2019-07-29: qty 1

## 2019-07-29 NOTE — Progress Notes (Signed)
PROGRESS NOTE    [name]  WOE:[id number] DOB: 1934/08/07 DOA: 07/23/2019 PCP: [name], MD    Brief Narrative:  85 year old female with a history of hypertension, diabetes, chronic kidney disease stage III, diastolic heart failure, admitted to the hospital with acute pancreatitis.  She was also noted to have new onset atrial fibrillation.  Started on IV hydration.  Started on Cardizem for rate control and Eliquis for anticoagulation.  She developed acute kidney injury on chronic kidney disease.  Shortly after admission, she was noted to be increasingly somnolent.  Blood gas was checked and she was noted to to have increased pCO2.  She was transferred to stepdown unit and started on BiPAP.  Assessment & Plan:   Principal Problem:   Pancreatitis, acute Active Problems:   New onset a-fib ([place])   Uncontrolled type 2 diabetes mellitus with diabetic nephropathy, with long-term current use of insulin (HCC)   Diabetes mellitus type 2 with complications (HCC)   Chronic diastolic CHF (congestive heart failure) (HCC)/EF > 60 %   Acute respiratory failure with hypoxemia (HCC)   CKD (chronic kidney disease), stage IIIb   Essential hypertension   Acute respiratory failure with hypercapnia (HCC)   AKI (acute kidney injury) ([place])   Acute pulmonary edema (HCC)  Brief Summary:- 85 y.o. female with past medical history relevant for HTN, DM, CKD 3B, dCHF , chronic LBB and chronic lower extremity edema admitted on 07/23/2019 with acute pancreatitis, UTI and atrial fibrillation -subsequently developed hypercapnic respiratory failure requiring transfer to stepdown unit for BiPAP use   A/p 1)Acute Pancreatitis.  She is status post prior cholecystectomy. No calcified intraductal gallstones identified on imaging.  No history of alcohol use.  She has been hydrated with IV fluids and clinically improving.  Tolerating full liquid diet, will advance to GI soft diet  2)New onset atrial fibrillation.   CHADSVASc of 8.  She is currently on oral diltiazem CD 120 mg  for rate control.   cardiology input appreciated, continue Eliquis 2.5 mg twice daily for stroke prophylaxis  -Appears back in sinus rhythm -Echo from 07/08/2019 with EF of 60 to 65% -Nuclear stress test from 07/18/2019 without reversible ischemia  3) E. coli UTI ----stopped Rocephin, continue Levaquin per sensitivity report   4)Acute respiratory failure with hypoxia and hypercarbia--- opiate probably contributed, off opiates, pulmonology consult from Dr. Melvyn Novas appreciated, holding off on possible thoracentesis--- patient developed further respiratory distress with increased work of breathing on 07/27/2019 through 07/28/2019, improved on BiPAP. -Chest x-ray suggestive of pulmonary edema, additional IV Lasix given -Very slowly improving, continues to require IV diuresis -PTA was not on home O2 currently requiring 3 L of nasal cannula  5)Acute on chronic diastolic congestive heart failure-right-sided heart failure symptoms noted, additional diuresis as above in #4 --- Patient continues to have hypoxic and hypercapnic respiratory failure  6)AKI----acute kidney injury on CKD stage -  IIIb--- creatinine on admission= 1.54 , baseline creatinine =  1.4 to 1.5   , creatinine is now=1.6 (peak was 2.14)  , renally adjust medications, avoid nephrotoxic agents / dehydration  / hypotension Creatinine initially bumped up since admission.  Possibly related to acute pancreatitis.  She also did receive IV contrast on admission. -- --Continue to watch renal function closely with IV diuresis I  7)Diabetes---  She is chronically on high-dose Levemir. --Monitor Levemir dosage closely as oral intake improves may increase insulin dosage, Use Novolog/Humalog Sliding scale insulin with Accu-Cheks/Fingersticks as ordered   8)HTN--- continue Cardizem CD  120 mg and hydralazine 50 mg bid  9)Abnormal imaging findings. Imaging findings suggest thyroid nodule and  possible --- outpatient follow-up with PCP for further investigation/work-up strongly advised  10)Morbid obesity--  BMI > 40.  Would benefit from weight loss.  11)Generalized weakness/deconditioning--PT eval appreciated recommends SNF rehab  12) acute on chronic anemia--normocytic and normochromic, on admission hemoglobin was 11.1 which is close to patient's previous baseline, hemoglobin currently stable above 9 -Monitor closely with Eliquis use   DVT prophylaxis: Eliquis Code Status: Full code Family Communication: Discussed with patient's daughter [name] at bedside  Disposition Plan:  --Decompensated respiratory status requiring prolonged use of BiPAP and additional IV Lasix/diuresis not medically ready for discharge at this time, requiring 3 L of oxygen, PTA was not on home O2.  Still requiring BiPAP on and off -Anticipate discharge to SNF when medically improved after further IV diuresis possibly on 07/31/2019 -  Consultants:   Cardiology/pulmonology  Procedures:     Antimicrobials:   Ceftriaxone 3/7 > thru 3/9  -Levaquin started on 07/26/2019   Subjective: Voiding okay, shortness of breath persist, dyspnea on exertion persist, requiring BiPAP on and off, continues to require oxygen currently on 3 L via nasal cannula -Reluctant to eat more due to fears of possible abdominal discomfort  Objective: Vitals:   07/29/19 1100 07/29/19 1143 07/29/19 1200 07/29/19 1300  BP: (!) 149/65  (!) 154/58 (!) 135/49  Pulse: 77  75 74  Resp: 20  16 15   Temp:  97.7 F (36.5 C)    TempSrc:  Axillary    SpO2: 99%  99% 98%  Weight:      Height:        Intake/Output Summary (Last 24 hours) at 07/29/2019 1402 Last data filed at 07/29/2019 0800 Gross per 24 hour  Intake 243 ml  Output 3000 ml  Net -2757 ml   Filed Weights   07/26/19 0625 07/27/19 0502 07/28/19 0600  Weight: 109.7 kg 110.1 kg 106.8 kg    Examination:  General exam: Awake, , dyspnea on minimal exertion  persist Nose- [place] 3L/min respiratory system: Slightly improved air movement, , work of breathing not labored, faint Rales in bases noted Cardiovascular system-regular, no murmurs, rubs, gallops. Gastrointestinal system: Abdomen is nondistended, soft and diffusely tender. Normal bowel sounds heard. Central nervous system: Generalized weakness, no focal neurological deficits. Extremities: 1+ edema bilaterally Skin: No rashes, lesions or ulcers Psychiatry: Affect is appropriate, alert and oriented x3  Data Reviewed:   CBC: Recent Labs  Lab 07/23/19 1321 07/24/19 0419 07/25/19 0444 07/27/19 0426 07/29/19 0543  WBC 11.9* 8.8 10.7* 9.5 9.0  NEUTROABS 9.7*  --   --   --   --   HGB 11.1* 10.0* 9.5* 9.5* 9.2*  HCT 36.4 33.5* 31.4* 31.4* 29.3*  MCV 91.7 93.3 92.4 92.6 90.2  PLT 247 204 218 225 790   Basic Metabolic Panel: Recent Labs  Lab 07/23/19 1321 07/23/19 1526 07/24/19 0419 07/25/19 0444 07/27/19 0426 07/29/19 0543  NA 141  --  139 139 141 139  K 4.1  --  4.9 4.9 4.2 4.2  CL 106  --  105 107 111 104  CO2 27  --  23 24 24 28   GLUCOSE 203*  --  386* 340* 153* 216*  BUN 32*  --  40* 58* 39* 42*  CREATININE 1.54*  --  2.03* 2.14* 1.53* 1.62*  CALCIUM 8.7*  --  8.1* 7.8* 7.9* 8.4*  MG  --  2.2  --   --   --   --  GFR: Estimated Creatinine Clearance: 31.4 mL/min (A) (by C-G formula based on SCr of 1.62 mg/dL (H)). Liver Function Tests: Recent Labs  Lab 07/23/19 1321 07/24/19 0419 07/25/19 0444 07/27/19 0426  AST 17 16 23  12*  ALT 16 16 27 21   ALKPHOS 55 46 44 43  BILITOT 0.6 0.5 0.4 0.5  PROT 8.0 7.2 7.0 6.9  ALBUMIN 3.6 3.2* 3.0* 2.7*   Recent Labs  Lab 07/23/19 1321 07/24/19 0419 07/25/19 0444  LIPASE 2,264* 1,489* 150*   No results for input(s): AMMONIA in the last 168 hours. Coagulation Profile: No results for input(s): INR, PROTIME in the last 168 hours. Cardiac Enzymes: No results for input(s): CKTOTAL, CKMB, CKMBINDEX, TROPONINI in the last 168  hours. BNP (last 3 results) No results for input(s): PROBNP in the last 8760 hours. HbA1C: No results for input(s): HGBA1C in the last 72 hours. CBG: Recent Labs  Lab 07/28/19 1143 07/28/19 1618 07/28/19 2102 07/29/19 0727 07/29/19 1102  GLUCAP 166* 185* 228* 175* 225*   Lipid Profile: No results for input(s): CHOL, HDL, LDLCALC, TRIG, CHOLHDL, LDLDIRECT in the last 72 hours. Thyroid Function Tests: No results for input(s): TSH, T4TOTAL, FREET4, T3FREE, THYROIDAB in the last 72 hours. Anemia Panel: No results for input(s): VITAMINB12, FOLATE, FERRITIN, TIBC, IRON, RETICCTPCT in the last 72 hours. Sepsis Labs: No results for input(s): PROCALCITON, LATICACIDVEN in the last 168 hours.  Recent Results (from the past 240 hour(s))  SARS CORONAVIRUS 2 (TAT 6-24 HRS) Nasopharyngeal Nasopharyngeal Swab     Status: None   Collection Time: 07/23/19  1:32 PM   Specimen: Nasopharyngeal Swab  Result Value Ref Range Status   SARS Coronavirus 2 NEGATIVE NEGATIVE Final    Comment: (NOTE) SARS-CoV-2 target nucleic acids are NOT DETECTED. The SARS-CoV-2 RNA is generally detectable in upper and lower respiratory specimens during the acute phase of infection. Negative results do not preclude SARS-CoV-2 infection, do not rule out co-infections with other pathogens, and should not be used as the sole basis for treatment or other patient management decisions. Negative results must be combined with clinical observations, patient history, and epidemiological information. The expected result is Negative. Fact Sheet for Patients: SugarRoll.be Fact Sheet for Healthcare Providers: https://www.woods-mathews.com/ This test is not yet approved or cleared by the [place] FDA and  has been authorized for detection and/or diagnosis of SARS-CoV-2 by FDA under an Emergency Use Authorization (EUA). This EUA will remain  in effect (meaning this test can be used)  for the duration of the COVID-19 declaration under Section 56 4(b)(1) of the Act, 21 U.S.C. section 360bbb-3(b)(1), unless the authorization is terminated or revoked sooner. Performed at [facility], [place] [address] [place], [place] [address]   Urine culture     Status: Abnormal   Collection Time: 07/23/19  2:26 PM   Specimen: Urine, Clean Catch  Result Value Ref Range Status   Specimen Description   Final    URINE, CLEAN CATCH Performed at [facility], [address], [place], [place] [address]    Special Requests   Final    NONE Performed at [facility], [address], [place], [place] [address]    Culture >=100,000 COLONIES/mL ESCHERICHIA COLI (A)  Final   Report Status 07/26/2019 FINAL  Final   Organism ID, Bacteria ESCHERICHIA COLI (A)  Final      Susceptibility   Escherichia coli - MIC*    AMPICILLIN >=32 RESISTANT Resistant     CEFAZOLIN >=64 RESISTANT Resistant  CEFTRIAXONE 2 INTERMEDIATE Intermediate     CIPROFLOXACIN <=0.25 SENSITIVE Sensitive     GENTAMICIN <=1 SENSITIVE Sensitive     IMIPENEM <=0.25 SENSITIVE Sensitive     NITROFURANTOIN <=16 SENSITIVE Sensitive     TRIMETH/SULFA <=20 SENSITIVE Sensitive     AMPICILLIN/SULBACTAM >=32 RESISTANT Resistant     PIP/TAZO 8 SENSITIVE Sensitive     * >=100,000 COLONIES/mL ESCHERICHIA COLI  MRSA PCR Screening     Status: None   Collection Time: 07/24/19  8:44 PM   Specimen: Nasal Mucosa; Nasopharyngeal  Result Value Ref Range Status   MRSA by PCR NEGATIVE NEGATIVE Final    Comment:        The GeneXpert MRSA Assay (FDA approved for NASAL specimens only), is one component of a comprehensive MRSA colonization surveillance program. It is not intended to diagnose MRSA infection nor to guide or monitor treatment for MRSA infections. Performed at [facility], [address] [place], [place] [address]      Radiology Studies: DG CHEST PORT 1 VIEW  Result Date: 07/28/2019 CLINICAL DATA:  Worsening  shortness of breath. Chronic kidney disease. Diabetes and hypertension. EXAM: PORTABLE CHEST 1 VIEW COMPARISON:  07/27/2019 FINDINGS: Cardiomegaly stable. Diffuse interstitial infiltrates show no significant change. Increased opacity at both lung bases likely due to increased atelectasis and small pleural effusions. IMPRESSION: 1. Increased bibasilar atelectasis and probable small pleural effusions. 2. Stable cardiomegaly and diffuse interstitial infiltrates, suspicious for pulmonary edema. Electronically Signed   By: Marlaine Hind M.D.   On: 07/28/2019 13:43   Scheduled Meds: . apixaban  2.5 mg Oral BID  . Chlorhexidine Gluconate Cloth  6 each Topical Daily  . diltiazem  120 mg Oral Daily  . furosemide  40 mg Intravenous BID  . gabapentin  300 mg Oral BID  . hydrALAZINE  50 mg Oral BID  . insulin aspart  0-5 Units Subcutaneous QHS  . insulin aspart  0-6 Units Subcutaneous TID WC  . insulin detemir  60 Units Subcutaneous QHS  . ipratropium-albuterol  3 mL Nebulization Q12H  . pantoprazole  40 mg Oral Daily  . polyethylene glycol  17 g Oral Daily  . predniSONE  50 mg Oral Once  . sodium chloride flush  3 mL Intravenous Q12H   Continuous Infusions: . sodium chloride    . levofloxacin (LEVAQUIN) IV 750 mg (07/28/19 0914)    LOS: 6 days   Roxan Hockey, MD [facility]  If 7PM-7AM, please contact night-coverage [email or web address]  07/29/2019, 2:02 PM   Shakia Sebastiano [place]

## 2019-07-29 NOTE — Plan of Care (Signed)

## 2019-07-30 LAB — GLUCOSE, CAPILLARY
Glucose-Capillary: 280 mg/dL — ABNORMAL HIGH (ref 70–99)
Glucose-Capillary: 323 mg/dL — ABNORMAL HIGH (ref 70–99)
Glucose-Capillary: 315 mg/dL — ABNORMAL HIGH (ref 70–99)
Glucose-Capillary: 322 mg/dL — ABNORMAL HIGH (ref 70–99)

## 2019-07-30 MED ORDER — FUROSEMIDE 10 MG/ML IJ SOLN
40.0000 mg | Freq: Once | INTRAMUSCULAR | Status: AC
  Administered 2019-07-30: 40 mg via INTRAVENOUS
  Filled 2019-07-30: qty 4

## 2019-07-30 MED ORDER — IPRATROPIUM-ALBUTEROL 0.5-2.5 (3) MG/3ML IN SOLN
3.0000 mL | Freq: Two times a day (BID) | RESPIRATORY_TRACT | Status: DC
  Administered 2019-07-30 – 2019-07-31 (×3): 3 mL via RESPIRATORY_TRACT
  Filled 2019-07-30 (×3): qty 3

## 2019-07-30 MED ORDER — INSULIN DETEMIR 100 UNIT/ML ~~LOC~~ SOLN
70.0000 [IU] | Freq: Every day | SUBCUTANEOUS | Status: DC
  Administered 2019-07-30: 70 [IU] via SUBCUTANEOUS
  Filled 2019-07-30 (×2): qty 0.7

## 2019-07-30 NOTE — TOC Progression Note (Signed)
Transition of Care [facility]) - Progression Note    Patient Details  Name: [name] MRN: [id number] Date of Birth: 02-16-1935  Transition of Care [facility]) CM/SW Contact  Shade Flood, LCSW Phone Number: 07/30/2019, 11:21 AM  Clinical Narrative:     TOC following. Pt was not stable for weekend dc as originally anticipated. Spoke with Jackelyn Poling at [facility] to update. Per Jackelyn Poling, pt will now need new COVID negative test result prior to admission. Updated MD today.  TOC will follow up in AM.   Expected Discharge Plan: [place] Barriers to Discharge: Continued Medical Work up  Expected Discharge Plan and Services Expected Discharge Plan: [place] In-house Referral: Clinical Social Work     Living arrangements for the past 2 months: [place]                                       Social Determinants of Health (SDOH) Interventions    Readmission Risk Interventions Readmission Risk Prevention Plan 07/27/2019  Transportation Screening Complete  Medication Review (RN CM) Complete  Some recent data might be hidden

## 2019-07-30 NOTE — Progress Notes (Signed)
PROGRESS NOTE    [name]  IWP:[id number] DOB: 1934-09-22 DOA: 07/23/2019 PCP: [name], MD    Brief Narrative:  85 year old female with a history of hypertension, diabetes, chronic kidney disease stage III, diastolic heart failure, admitted to the hospital with acute pancreatitis.  She was also noted to have new onset atrial fibrillation.  Started on IV hydration.  Started on Cardizem for rate control and Eliquis for anticoagulation.  She developed acute kidney injury on chronic kidney disease.  Shortly after admission, she was noted to be increasingly somnolent.  Blood gas was checked and she was noted to to have increased pCO2.  She was transferred to stepdown unit and started on BiPAP.  Assessment & Plan:   Principal Problem:   Pancreatitis, acute Active Problems:   New onset a-fib ([place])   Uncontrolled type 2 diabetes mellitus with diabetic nephropathy, with long-term current use of insulin (HCC)   Diabetes mellitus type 2 with complications (HCC)   Chronic diastolic CHF (congestive heart failure) (HCC)/EF > 60 %   Acute respiratory failure with hypoxemia (HCC)   CKD (chronic kidney disease), stage IIIb   Essential hypertension   Acute respiratory failure with hypercapnia (HCC)   AKI (acute kidney injury) ([place])   Acute pulmonary edema (HCC)  Brief Summary:- 85 y.o. female with past medical history relevant for HTN, DM, CKD 3B, dCHF , chronic LBB and chronic lower extremity edema admitted on 07/23/2019 with acute pancreatitis, UTI and atrial fibrillation -subsequently developed hypercapnic respiratory failure requiring transfer to stepdown unit for BiPAP use   A/p 1)Acute Pancreatitis- She is status post prior cholecystectomy. No calcified intraductal gallstones identified on imaging.  No history of alcohol use.    -Tolerating GI soft diet okay, no emesis or significant abdominal pain  2)New onset atrial fibrillation.  CHADSVASc of 8.  She is currently on oral diltiazem CD  120 mg  for rate control.   cardiology input appreciated,  -continue Eliquis 2.5 mg twice daily for stroke prophylaxis  -Appears back in sinus rhythm -Echo from 07/08/2019 with EF of 60 to 65% -Nuclear stress test from 07/18/2019 without reversible ischemia  3) E. coli UTI ----stopped Rocephin, continue Levaquin per sensitivity report , last dose 07/30/2019  4)Acute respiratory failure with hypoxia and hypercarbia--- opiate probably contributed, off opiates, pulmonology consult from Dr. Melvyn Novas appreciated, holding off on possible thoracentesis--- patient developed further respiratory distress with increased work of breathing on 07/27/2019 through 07/28/2019, improved on BiPAP. -Chest x-ray suggestive of pulmonary edema, additional IV Lasix given -Very slowly improving, continues to require IV diuresis -PTA was not on home O2 currently requiring 2 to 3 L of nasal cannula -Repeat chest x-ray on 07/31/2019 after additional diuresis  5)Acute on chronic diastolic congestive heart failure-right-sided heart failure symptoms noted, additional diuresis as above in #4 --- Patient continues to have hypoxic and hypercapnic respiratory failure--please see #4 above  6)AKI----acute kidney injury on CKD stage -  IIIb--- creatinine on admission= 1.54 , baseline creatinine =  1.4 to 1.5   , creatinine is now=1.6 (peak was 2.14)  , renally adjust medications, avoid nephrotoxic agents / dehydration  / hypotension Creatinine initially bumped up since admission.  Possibly related to acute pancreatitis.  She also did receive IV contrast on admission. -- --Continue to watch renal function closely with IV diuresis I  7)Diabetes---  She is chronically on high-dose Levemir.  --Blood sugars trending up, increase Levemir to 70 units from 60 units, PTA patient was on 80 units  nightly- -- Use Novolog/Humalog Sliding scale insulin with Accu-Cheks/Fingersticks as ordered   8)HTN--- continue Cardizem CD 120 mg and hydralazine 50 mg  bid  9)Abnormal imaging findings. Imaging findings suggest thyroid nodule and possible --- outpatient follow-up with PCP for further investigation/work-up strongly advised  10)Morbid obesity--  BMI > 40.  Would benefit from weight loss.  11)Generalized weakness/deconditioning--PT eval appreciated recommends SNF rehab  12) acute on chronic anemia--normocytic and normochromic, on admission hemoglobin was 11.1 which is close to patient's previous baseline, hemoglobin currently stable above 9 -Monitor closely with Eliquis use   DVT prophylaxis: Eliquis Code Status: Full code Family Communication: Discussed with patient's daughter [name] at bedside  Disposition Plan:  --Decompensated respiratory status requiring prolonged use of BiPAP and additional IV Lasix/diuresis -Anticipate discharge to SNF on 07/31/2019 with oxygen pending further evaluation, additional IV diuresis and repeat chest x-ray    Consultants:   Cardiology/pulmonology  Procedures:     Antimicrobials:   Ceftriaxone 3/7 > thru 3/9  -Levaquin started on 07/26/2019   Subjective: -Shortness of breath at rest improving, dyspnea on exertion persist -Hypoxia persist -Blood sugars trending up, no emesis  Objective: Vitals:   07/30/19 0530 07/30/19 0550 07/30/19 0836 07/30/19 1417  BP:  (!) 162/64  (!) 171/60  Pulse:  76  71  Resp:  17  16  Temp:  98.9 F (37.2 C)  98.1 F (36.7 C)  TempSrc:      SpO2:  96% 98% 98%  Weight: 106.5 kg     Height:        Intake/Output Summary (Last 24 hours) at 07/30/2019 1949 Last data filed at 07/30/2019 1700 Gross per 24 hour  Intake 720 ml  Output 3351 ml  Net -2631 ml   Filed Weights   07/27/19 0502 07/28/19 0600 07/30/19 0530  Weight: 110.1 kg 106.8 kg 106.5 kg    Examination:  General exam: Awake, , dyspnea on minimal exertion persist Nose- [place] 2L/min respiratory system: Improving air movement, , work of breathing not labored, faint Rales in bases  noted Cardiovascular system-regular, no murmurs, rubs, gallops. Gastrointestinal system: Abdomen is nondistended, soft and improved tenderness,. Normal bowel sounds heard. Central nervous system: Generalized weakness, no focal neurological deficits. Extremities: 1+ edema bilaterally Skin: No rashes, lesions or ulcers Psychiatry: Affect is appropriate, alert and oriented x3  Data Reviewed:   CBC: Recent Labs  Lab 07/24/19 0419 07/25/19 0444 07/27/19 0426 07/29/19 0543  WBC 8.8 10.7* 9.5 9.0  HGB 10.0* 9.5* 9.5* 9.2*  HCT 33.5* 31.4* 31.4* 29.3*  MCV 93.3 92.4 92.6 90.2  PLT 204 218 225 295   Basic Metabolic Panel: Recent Labs  Lab 07/24/19 0419 07/25/19 0444 07/27/19 0426 07/29/19 0543  NA 139 139 141 139  K 4.9 4.9 4.2 4.2  CL 105 107 111 104  CO2 23 24 24 28   GLUCOSE 386* 340* 153* 216*  BUN 40* 58* 39* 42*  CREATININE 2.03* 2.14* 1.53* 1.62*  CALCIUM 8.1* 7.8* 7.9* 8.4*   GFR: Estimated Creatinine Clearance: 31.3 mL/min (A) (by C-G formula based on SCr of 1.62 mg/dL (H)). Liver Function Tests: Recent Labs  Lab 07/24/19 0419 07/25/19 0444 07/27/19 0426  AST 16 23 12*  ALT 16 27 21   ALKPHOS 46 44 43  BILITOT 0.5 0.4 0.5  PROT 7.2 7.0 6.9  ALBUMIN 3.2* 3.0* 2.7*   Recent Labs  Lab 07/24/19 0419 07/25/19 0444  LIPASE 1,489* 150*   No results for input(s): AMMONIA in the last 168 hours.  Coagulation Profile: No results for input(s): INR, PROTIME in the last 168 hours. Cardiac Enzymes: No results for input(s): CKTOTAL, CKMB, CKMBINDEX, TROPONINI in the last 168 hours. BNP (last 3 results) No results for input(s): PROBNP in the last 8760 hours. HbA1C: No results for input(s): HGBA1C in the last 72 hours. CBG: Recent Labs  Lab 07/29/19 1629 07/29/19 2126 07/30/19 0734 07/30/19 1124 07/30/19 1628  GLUCAP 248* 324* 280* 323* 315*   Lipid Profile: No results for input(s): CHOL, HDL, LDLCALC, TRIG, CHOLHDL, LDLDIRECT in the last 72 hours. Thyroid  Function Tests: No results for input(s): TSH, T4TOTAL, FREET4, T3FREE, THYROIDAB in the last 72 hours. Anemia Panel: No results for input(s): VITAMINB12, FOLATE, FERRITIN, TIBC, IRON, RETICCTPCT in the last 72 hours. Sepsis Labs: No results for input(s): PROCALCITON, LATICACIDVEN in the last 168 hours.  Recent Results (from the past 240 hour(s))  SARS CORONAVIRUS 2 (TAT 6-24 HRS) Nasopharyngeal Nasopharyngeal Swab     Status: None   Collection Time: 07/23/19  1:32 PM   Specimen: Nasopharyngeal Swab  Result Value Ref Range Status   SARS Coronavirus 2 NEGATIVE NEGATIVE Final    Comment: (NOTE) SARS-CoV-2 target nucleic acids are NOT DETECTED. The SARS-CoV-2 RNA is generally detectable in upper and lower respiratory specimens during the acute phase of infection. Negative results do not preclude SARS-CoV-2 infection, do not rule out co-infections with other pathogens, and should not be used as the sole basis for treatment or other patient management decisions. Negative results must be combined with clinical observations, patient history, and epidemiological information. The expected result is Negative. Fact Sheet for Patients: SugarRoll.be Fact Sheet for Healthcare Providers: https://www.woods-mathews.com/ This test is not yet approved or cleared by the [place] FDA and  has been authorized for detection and/or diagnosis of SARS-CoV-2 by FDA under an Emergency Use Authorization (EUA). This EUA will remain  in effect (meaning this test can be used) for the duration of the COVID-19 declaration under Section 56 4(b)(1) of the Act, 21 U.S.C. section 360bbb-3(b)(1), unless the authorization is terminated or revoked sooner. Performed at [facility], [place] [address], [place], [place] [address]   Urine culture     Status: Abnormal   Collection Time: 07/23/19  2:26 PM   Specimen: Urine, Clean Catch  Result Value Ref Range Status    Specimen Description   Final    URINE, CLEAN CATCH Performed at [facility], [address] [place], [place] [address]    Special Requests   Final    NONE Performed at [facility], [address] [place], [place] [address]    Culture >=100,000 COLONIES/mL ESCHERICHIA COLI (A)  Final   Report Status 07/26/2019 FINAL  Final   Organism ID, Bacteria ESCHERICHIA COLI (A)  Final      Susceptibility   Escherichia coli - MIC*    AMPICILLIN >=32 RESISTANT Resistant     CEFAZOLIN >=64 RESISTANT Resistant     CEFTRIAXONE 2 INTERMEDIATE Intermediate     CIPROFLOXACIN <=0.25 SENSITIVE Sensitive     GENTAMICIN <=1 SENSITIVE Sensitive     IMIPENEM <=0.25 SENSITIVE Sensitive     NITROFURANTOIN <=16 SENSITIVE Sensitive     TRIMETH/SULFA <=20 SENSITIVE Sensitive     AMPICILLIN/SULBACTAM >=32 RESISTANT Resistant     PIP/TAZO 8 SENSITIVE Sensitive     * >=100,000 COLONIES/mL ESCHERICHIA COLI  MRSA PCR Screening     Status: None   Collection Time: 07/24/19  8:44 PM   Specimen: Nasal Mucosa; Nasopharyngeal  Result  Value Ref Range Status   MRSA by PCR NEGATIVE NEGATIVE Final    Comment:        The GeneXpert MRSA Assay (FDA approved for NASAL specimens only), is one component of a comprehensive MRSA colonization surveillance program. It is not intended to diagnose MRSA infection nor to guide or monitor treatment for MRSA infections. Performed at [facility], [address] [place], [place] [address]      Radiology Studies: No results found. Scheduled Meds: . apixaban  2.5 mg Oral BID  . Chlorhexidine Gluconate Cloth  6 each Topical Daily  . diltiazem  120 mg Oral Daily  . furosemide  40 mg Intravenous BID  . furosemide  40 mg Intravenous Once  . gabapentin  300 mg Oral BID  . hydrALAZINE  50 mg Oral BID  . insulin aspart  0-5 Units Subcutaneous QHS  . insulin aspart  0-6 Units Subcutaneous TID WC  . insulin detemir  70 Units Subcutaneous QHS  . ipratropium-albuterol  3 mL  Nebulization BID  . pantoprazole  40 mg Oral Daily  . polyethylene glycol  17 g Oral Daily  . sodium chloride flush  3 mL Intravenous Q12H   Continuous Infusions: . sodium chloride      LOS: 7 days   Roxan Hockey, MD [facility]  If 7PM-7AM, please contact night-coverage [email or web address]  07/30/2019, 7:49 PM   Velvet Moomaw [place] Brick

## 2019-07-31 ENCOUNTER — Inpatient Hospital Stay (HOSPITAL_COMMUNITY): Payer: Medicare HMO

## 2019-07-31 DIAGNOSIS — E1140 Type 2 diabetes mellitus with diabetic neuropathy, unspecified: Secondary | ICD-10-CM | POA: Diagnosis not present

## 2019-07-31 DIAGNOSIS — J9601 Acute respiratory failure with hypoxia: Secondary | ICD-10-CM | POA: Diagnosis not present

## 2019-07-31 DIAGNOSIS — N179 Acute kidney failure, unspecified: Secondary | ICD-10-CM | POA: Diagnosis not present

## 2019-07-31 DIAGNOSIS — I4819 Other persistent atrial fibrillation: Secondary | ICD-10-CM | POA: Diagnosis not present

## 2019-07-31 DIAGNOSIS — I2510 Atherosclerotic heart disease of native coronary artery without angina pectoris: Secondary | ICD-10-CM | POA: Diagnosis not present

## 2019-07-31 DIAGNOSIS — K859 Acute pancreatitis without necrosis or infection, unspecified: Secondary | ICD-10-CM | POA: Diagnosis not present

## 2019-07-31 DIAGNOSIS — R69 Illness, unspecified: Secondary | ICD-10-CM | POA: Diagnosis not present

## 2019-07-31 DIAGNOSIS — J9621 Acute and chronic respiratory failure with hypoxia: Secondary | ICD-10-CM | POA: Diagnosis not present

## 2019-07-31 DIAGNOSIS — E118 Type 2 diabetes mellitus with unspecified complications: Secondary | ICD-10-CM | POA: Diagnosis not present

## 2019-07-31 DIAGNOSIS — I5032 Chronic diastolic (congestive) heart failure: Secondary | ICD-10-CM | POA: Diagnosis not present

## 2019-07-31 DIAGNOSIS — J811 Chronic pulmonary edema: Secondary | ICD-10-CM | POA: Diagnosis not present

## 2019-07-31 DIAGNOSIS — R0602 Shortness of breath: Secondary | ICD-10-CM | POA: Diagnosis not present

## 2019-07-31 DIAGNOSIS — M6281 Muscle weakness (generalized): Secondary | ICD-10-CM | POA: Diagnosis not present

## 2019-07-31 DIAGNOSIS — Z7401 Bed confinement status: Secondary | ICD-10-CM | POA: Diagnosis not present

## 2019-07-31 DIAGNOSIS — J9602 Acute respiratory failure with hypercapnia: Secondary | ICD-10-CM | POA: Diagnosis not present

## 2019-07-31 DIAGNOSIS — I10 Essential (primary) hypertension: Secondary | ICD-10-CM | POA: Diagnosis not present

## 2019-07-31 DIAGNOSIS — E119 Type 2 diabetes mellitus without complications: Secondary | ICD-10-CM | POA: Diagnosis not present

## 2019-07-31 DIAGNOSIS — N1832 Chronic kidney disease, stage 3b: Secondary | ICD-10-CM | POA: Diagnosis not present

## 2019-07-31 DIAGNOSIS — K8500 Idiopathic acute pancreatitis without necrosis or infection: Secondary | ICD-10-CM | POA: Diagnosis not present

## 2019-07-31 DIAGNOSIS — N183 Chronic kidney disease, stage 3 unspecified: Secondary | ICD-10-CM | POA: Diagnosis not present

## 2019-07-31 DIAGNOSIS — J810 Acute pulmonary edema: Secondary | ICD-10-CM | POA: Diagnosis not present

## 2019-07-31 DIAGNOSIS — Z79899 Other long term (current) drug therapy: Secondary | ICD-10-CM | POA: Diagnosis not present

## 2019-07-31 DIAGNOSIS — D649 Anemia, unspecified: Secondary | ICD-10-CM | POA: Diagnosis not present

## 2019-07-31 DIAGNOSIS — I4891 Unspecified atrial fibrillation: Secondary | ICD-10-CM | POA: Diagnosis not present

## 2019-07-31 LAB — BASIC METABOLIC PANEL
Anion gap: 10 (ref 5–15)
BUN: 37 mg/dL — ABNORMAL HIGH (ref 8–23)
CO2: 29 mmol/L (ref 22–32)
Calcium: 8.4 mg/dL — ABNORMAL LOW (ref 8.9–10.3)
Chloride: 101 mmol/L (ref 98–111)
Creatinine, Ser: 1.49 mg/dL — ABNORMAL HIGH (ref 0.44–1.00)
GFR calc Af Amer: 37 mL/min — ABNORMAL LOW (ref >60)
GFR calc non Af Amer: 32 mL/min — ABNORMAL LOW (ref >60)
Glucose, Bld: 208 mg/dL — ABNORMAL HIGH (ref 70–99)
Potassium: 3.6 mmol/L (ref 3.5–5.1)
Sodium: 140 mmol/L (ref 135–145)

## 2019-07-31 LAB — CBC
HCT: 32.4 % — ABNORMAL LOW (ref 36.0–46.0)
Hemoglobin: 10.2 g/dL — ABNORMAL LOW (ref 12.0–15.0)
MCH: 27.6 pg (ref 26.0–34.0)
MCHC: 31.5 g/dL (ref 30.0–36.0)
MCV: 87.8 fL (ref 80.0–100.0)
Platelets: 290 10*3/uL (ref 150–400)
RBC: 3.69 MIL/uL — ABNORMAL LOW (ref 3.87–5.11)
RDW: 15.7 % — ABNORMAL HIGH (ref 11.5–15.5)
WBC: 7.9 10*3/uL (ref 4.0–10.5)
nRBC: 0 % (ref 0.0–0.2)

## 2019-07-31 LAB — RESPIRATORY PANEL BY RT PCR (FLU A&B, COVID)
Influenza A by PCR: NEGATIVE
Influenza B by PCR: NEGATIVE
SARS Coronavirus 2 by RT PCR: NEGATIVE

## 2019-07-31 LAB — GLUCOSE, CAPILLARY
Glucose-Capillary: 173 mg/dL — ABNORMAL HIGH (ref 70–99)
Glucose-Capillary: 221 mg/dL — ABNORMAL HIGH (ref 70–99)

## 2019-07-31 LAB — SARS CORONAVIRUS 2 (TAT 6-24 HRS): SARS Coronavirus 2: NEGATIVE

## 2019-07-31 MED ORDER — DILTIAZEM HCL ER COATED BEADS 120 MG PO CP24: 120.0000 mg | ORAL_CAPSULE | Freq: Every day | ORAL | 3 refills | Status: DC

## 2019-07-31 MED ORDER — ACETAMINOPHEN 325 MG PO TABS: 650.0000 mg | ORAL_TABLET | Freq: Four times a day (QID) | ORAL | 0 refills | Status: AC | PRN

## 2019-07-31 MED ORDER — TORSEMIDE 20 MG PO TABS: 20.0000 mg | ORAL_TABLET | Freq: Two times a day (BID) | ORAL | 3 refills | Status: DC

## 2019-07-31 MED ORDER — POLYETHYLENE GLYCOL 3350 17 G PO PACK: 17.0000 g | PACK | Freq: Every day | ORAL | 0 refills | Status: DC

## 2019-07-31 MED ORDER — INSULIN DETEMIR 100 UNIT/ML ~~LOC~~ SOLN: 70.0000 [IU] | Freq: Every day | SUBCUTANEOUS | 11 refills | Status: DC

## 2019-07-31 MED ORDER — ISOSORBIDE MONONITRATE ER 30 MG PO TB24: 30.0000 mg | ORAL_TABLET | Freq: Every day | ORAL | 11 refills | Status: DC

## 2019-07-31 MED ORDER — INSULIN ASPART 100 UNIT/ML ~~LOC~~ SOLN: 10.0000 [IU] | Freq: Three times a day (TID) | SUBCUTANEOUS | 11 refills | Status: DC

## 2019-07-31 MED ORDER — INSULIN ASPART 100 UNIT/ML FLEXPEN: 0.0000 [IU] | PEN_INJECTOR | Freq: Three times a day (TID) | SUBCUTANEOUS | 11 refills | Status: AC

## 2019-07-31 MED ORDER — APIXABAN 2.5 MG PO TABS: 2.5000 mg | ORAL_TABLET | Freq: Two times a day (BID) | ORAL | 2 refills | Status: DC

## 2019-07-31 MED ORDER — PREDNISONE 20 MG PO TABS: 50.0000 mg | ORAL_TABLET | Freq: Once | ORAL | Status: DC

## 2019-07-31 NOTE — TOC Transition Note (Signed)
Transition of Care [facility]) - CM/SW Discharge Note   Patient Details  Name: [name] MRN: [id number] Date of Birth: Nov 15, 1934  Transition of Care [facility]) CM/SW Contact:  Jaylon Grode, Chauncey Reading, RN Phone Number: 07/31/2019, 1:25 PM   Clinical Narrative:   Patient discharging to [facility] today. Bedside RN to call report. EMS arranged for 2 pm.       Barriers to Discharge: Continued Medical Work up   Patient Goals and CMS Choice Patient states their goals for this hospitalization and ongoing recovery are:: Get better CMS Medicare.gov Compare Post Acute Care list provided to:: Patient Choice offered to / list presented to : Patient       Discharge Plan and Services In-house Referral: Clinical Social Work                      Social Determinants of Health ([place]) Interventions     Readmission Risk Interventions Readmission Risk Prevention Plan 07/27/2019  Transportation Screening Complete  Medication Review (RN CM) Complete  Some recent data might be hidden

## 2019-07-31 NOTE — Progress Notes (Signed)
Physical Therapy Treatment Patient Details Name: [name] MRN: [id number] DOB: 05-Apr-1935 Today's Date: 07/31/2019    History of Present Illness [name]  is a 85 y.o. female with past medical history relevant for HTN, DM, CKD 3B, dCHF , chronic LBB and chronic lower extremity edema who presents to the ED with epigastric pain associated with nausea and vomiting    PT Comments    Patient demonstrates improvement for sitting up at bedside requiring less assistance, limited to a few steps at bedside due to poor standing balance, difficulty advancing RLE with baseline drop foot and c/o fatigue.  Patient tolerated sitting up in chair after therapy - NT notified.  Patient will benefit from continued physical therapy in hospital and recommended venue below to increase strength, balance, endurance for safe ADLs and gait.   Follow Up Recommendations  SNF     Equipment Recommendations  None recommended by PT    Recommendations for Other Services       Precautions / Restrictions Precautions Precautions: Fall Restrictions Weight Bearing Restrictions: No    Mobility  Bed Mobility Overal bed mobility: Needs Assistance Bed Mobility: Supine to Sit     Supine to sit: Min assist     General bed mobility comments: increased time, labored movement  Transfers Overall transfer level: Needs assistance Equipment used: Rolling walker (2 wheeled) Transfers: Sit to/from [facility] Sit to Stand: Mod assist Stand pivot transfers: Mod assist       General transfer comment: slow labored movement, unsteady on feet with diffiuclty extending trunk  Ambulation/Gait Ambulation/Gait assistance: Mod assist;Max assist Gait Distance (Feet): 5 Feet Assistive device: Rolling walker (2 wheeled) Gait Pattern/deviations: Decreased step length - left;Decreased stance time - right;Decreased stride length;Trunk flexed;Decreased dorsiflexion - right Gait velocity: slow   General Gait  Details: slow labored unsteady cadence with difficulty advancing RLE due to drop foot (baseline for patient), limited secondary to c/o fatigue   Stairs             Wheelchair Mobility    Modified Rankin (Stroke Patients Only)       Balance Overall balance assessment: Needs assistance Sitting-balance support: Feet supported;No upper extremity supported Sitting balance-Leahy Scale: Fair Sitting balance - Comments: fair/good seated at EOB   Standing balance support: During functional activity;Bilateral upper extremity supported Standing balance-Leahy Scale: Poor Standing balance comment: fair/poor using RW                            Cognition Arousal/Alertness: Awake/alert Behavior During Therapy: WFL for tasks assessed/performed Overall Cognitive Status: Within Functional Limits for tasks assessed                                        Exercises General Exercises - Lower Extremity Ankle Circles/Pumps: Seated;AROM;Strengthening;Left;10 reps Long Arc Quad: Seated;AROM;Strengthening;Both;10 reps Hip Flexion/Marching: Seated;AROM;Strengthening;Both;10 reps    General Comments        Pertinent Vitals/Pain Pain Assessment: No/denies pain    Home Living                      Prior Function            PT Goals (current goals can now be found in the care plan section) Acute Rehab PT Goals Patient Stated Goal: return home with family to assist after rehab PT Goal  Formulation: With patient Time For Goal Achievement: 08/09/19 Potential to Achieve Goals: Good Progress towards PT goals: Progressing toward goals    Frequency    Min 3X/week      PT Plan Current plan remains appropriate    Co-evaluation              AM-PAC PT "6 Clicks" Mobility   Outcome Measure  Help needed turning from your back to your side while in a flat bed without using bedrails?: A Little Help needed moving from lying on your back to sitting  on the side of a flat bed without using bedrails?: A Little Help needed moving to and from a bed to a chair (including a wheelchair)?: A Lot Help needed standing up from a chair using your arms (e.g., wheelchair or bedside chair)?: A Lot Help needed to walk in hospital room?: A Lot Help needed climbing 3-5 steps with a railing? : Total 6 Click Score: 13    End of Session   Activity Tolerance: Patient tolerated treatment well;Patient limited by fatigue;Patient limited by lethargy Patient left: in chair;with call bell/phone within reach Nurse Communication: Mobility status PT Visit Diagnosis: Unsteadiness on feet (R26.81);Other abnormalities of gait and mobility (R26.89);Muscle weakness (generalized) (M62.81)     Time: 4742-5956 PT Time Calculation (min) (ACUTE ONLY): 27 min  Charges:  $Therapeutic Exercise: 8-22 mins $Therapeutic Activity: 8-22 mins                     11:23 AM, 07/31/19 [name], MPT Physical Therapist with [facility] [phone] office [phone] mobile phone

## 2019-07-31 NOTE — Discharge Instructions (Signed)
1)Very low-salt diet advised 2)Weigh yourself daily, call if you gain more than 3 pounds in 1 day or more than 5 pounds in 1 week as your diuretic medications may need to be adjusted 3)Limit your Fluid  intake to no more than 60 ounces (1.8 Liters) per day 4) you are taking apixaban/Eliquis so Avoid ibuprofen/Advil/Aleve/Motrin/Goody Powders/Naproxen/BC powders/Meloxicam/Diclofenac/Indomethacin and other Nonsteroidal anti-inflammatory medications as these will make you more likely to bleed and can cause stomach ulcers, can also cause Kidney problems.  5) repeat CBC and BMP every Thursday for 2 weeks 6)insulin aspart (novoLOG) injection 0-10 Units  0-10 Units Subcutaneous, 3 times daily with meals  CBG < 70: Implement Hypoglycemia Standing Orders and refer to Hypoglycemia Standing Orders sidebar report   CBG 70 - 120: 0 unit CBG 121 - 150: 0 unit  CBG 151 - 200: 1 unit  CBG 201 - 250: 2 units  CBG 251 - 300: 4 units  CBG 301 - 350: 6 units   CBG 351 - 400: 8 units  CBG > 400: 10 units   Information on my medicine - ELIQUIS (apixaban)  This medication education was reviewed with me or my healthcare representative as part of my discharge preparation.   Why was Eliquis prescribed for you? Eliquis was prescribed for you to reduce the risk of a blood clot forming that can cause a stroke if you have a medical condition called atrial fibrillation (a type of irregular heartbeat).  What do You need to know about Eliquis ? Take your Eliquis TWICE DAILY - one tablet in the morning and one tablet in the evening with or without food. If you have difficulty swallowing the tablet whole please discuss with your pharmacist how to take the medication safely.  Take Eliquis exactly as prescribed by your doctor and DO NOT stop taking Eliquis without talking to the doctor who prescribed the medication.  Stopping may increase your risk of developing a stroke.  Refill your prescription before you run  out.  After discharge, you should have regular check-up appointments with your healthcare provider that is prescribing your Eliquis.  In the future your dose may need to be changed if your kidney function or weight changes by a significant amount or as you get older.  What do you do if you miss a dose? If you miss a dose, take it as soon as you remember on the same day and resume taking twice daily.  Do not take more than one dose of ELIQUIS at the same time to make up a missed dose.  Important Safety Information A possible side effect of Eliquis is bleeding. You should call your healthcare provider right away if you experience any of the following: ? Bleeding from an injury or your nose that does not stop. ? Unusual colored urine (red or dark brown) or unusual colored stools (red or black). ? Unusual bruising for unknown reasons. ? A serious fall or if you hit your head (even if there is no bleeding).  Some medicines may interact with Eliquis and might increase your risk of bleeding or clotting while on Eliquis. To help avoid this, consult your healthcare provider or pharmacist prior to using any new prescription or non-prescription medications, including herbals, vitamins, non-steroidal anti-inflammatory drugs (NSAIDs) and supplements.  This website has more information on Eliquis (apixaban): http://www.eliquis.com/eliquis/home

## 2019-07-31 NOTE — Progress Notes (Signed)
PULMONARY / CRITICAL CARE MEDICINE   NAME:  [name], MRN:  [id number], DOB:  [birth year], LOS: 8 ADMISSION DATE:  07/23/2019, CONSULTATION DATE:  3/9 REFERRING MD:  [facility], CHIEF COMPLAINT:  resp distress  BRIEF HISTORY:     26 yobf never smoker admit 3/7 with acute pancreatitis ? Etiology with new AF and rx with eliquis/fluids /cardizem with progressive hypersomnolence and found to be hypercarbic  am 3/9 and moved to ICU for bipap and pccm service asked to see.      SIGNIFICANT PAST MEDICAL HISTORY    . Arthritis   . Chronic diastolic heart failure ([place])   . CKD (chronic kidney disease)   . CVA (cerebral infarction)   . Diabetes mellitus without complication ([place])   . GERD (gastroesophageal reflux disease)   . Hemorrhoids   . Hypertension   . Stroke [facility])      SIGNIFICANT EVENTS:  3/9 transferred to ICU for Bipap due to hypercarbia 3/10 converted back to NSR  STUDIES:   Echo 06/29/19:  Grade II diastolic dysfunction with severe LAE  Cta Chest 3/7  :1. No evidence of acute pulmonary artery filling defect to suggest pulmonary embolism. 2. Features most suggestive of likely cardiogenic pulmonary edema with small bilateral effusions and reflux of contrast into the hepatic veins which can suggest some right-sided heart dysfunction or elevated right heart pressures. 3. Borderline enlarged low-attenuation mediastinal and hilar nodes, likely reactive/edematous. CTabd 3/7 1. Mild diffuse peripancreatic inflammation compatible with acute pancreatitis particularly given the setting of a elevated lipase. No evidence of pancreatic necrosis, significant free fluid or organized pancreatic collections. Paraduodenal inflammation is likely reactive. 2. Mild pancreatic ductal dilatation, can be related to the acute pancreatic process. Attention on follow-up imaging is recommended. If persistently dilated, MRCP could be obtained.   CULTURES:  UC 3/7  100k e coli intermediate to  rocephin / sensitive to cipro  Covid 19 pcr  3/7 neg  MRSA PCR 3/8 neg    ANTIBIOTICS:   Rocephin 3/7 >>>3/10 Levaquin 3/10 >>>  LINES/TUBES:     CONSULTANTS:  PCCM 3/9  Cardiology 3/9  Scheduled Meds: . apixaban  2.5 mg Oral BID  . Chlorhexidine Gluconate Cloth  6 each Topical Daily  . diltiazem  120 mg Oral Daily  . furosemide  40 mg Intravenous BID  . gabapentin  300 mg Oral BID  . hydrALAZINE  50 mg Oral BID  . insulin aspart  0-5 Units Subcutaneous QHS  . insulin aspart  0-6 Units Subcutaneous TID WC  . insulin detemir  70 Units Subcutaneous QHS  . ipratropium-albuterol  3 mL Nebulization BID  . pantoprazole  40 mg Oral Daily  . polyethylene glycol  17 g Oral Daily  . predniSONE  50 mg Oral Once  . sodium chloride flush  3 mL Intravenous Q12H   Continuous Infusions: . sodium chloride     PRN Meds:.sodium chloride, acetaminophen **OR** acetaminophen, labetalol, levalbuterol, promethazine, sodium chloride flush, traZODone   SUBJECTIVE:   Off bipap, on RA Denies CP, abd pain  CONSTITUTIONAL: BP (!) 153/65 (BP Location: Right Arm)   Pulse 64   Temp 98.1 F (36.7 C) (Oral)   Resp 16   Ht 5\' 4"  (1.626 m)   Wt 99.3 kg   SpO2 97%   BMI 37.58 kg/m    Intake/Output Summary (Last 24 hours) at 07/31/2019 1411 Last data filed at 07/31/2019 0700 Gross per 24 hour  Intake 480 ml  Output 3000 ml  Net -  2520 ml          PHYSICAL EXAM: General:  Elderly bf on RA , sitting in a chair Neuro:  Alert/ approp HEENT: neck supple, no jvd  Cardiovascular:  RRR nSR on monitor Lungs:  Decreased bs , no rhonchi, no accessory muscle use  Abdomen:  Mod distended, non tender Musculoskeletal: warm with severe venous stasis  Skin: no rash / skin breakdown   CXR 3/15 improved bilateral interstitial infiltrates & effusions   ASSESSMENT AND PLAN   1) acute hypoxemic/hypercarbic resp failure in never smoker with pancreatitis with prior use of opioids on hold and was doing  better but appears vol overloaded on cxr  ? Underlying OSA -  Resolved with diuresis suggesting acute pulm edema was the main issue -sleep study as outpt given hypercarbia    2) Atrial fib with diastolic dysfunction on cardizem/doac - eliquis per cards, recent nuclear stress test was neg  3) Acute pancreatitis ? Etiology s/p remote cholecystectomy s obstruction apparent  - w/u ? GI ?  -  resolved  4) AKI s/p IV contrast/ 3rd spacing due to sirs from pancreatits Lab Results  Component Value Date   CREATININE 1.49 (H) 07/31/2019   CREATININE 1.62 (H) 07/29/2019   CREATININE 1.53 (H) 07/27/2019  improving UO & cr with lasix - neg 8.5 L   5) E coli UTI - complete levaquin  PCCM available as needed  Kara Mead MD. FCCP. [facility]  If no response to pager , please call [phone]   07/31/2019

## 2019-07-31 NOTE — Progress Notes (Signed)
Inpatient Diabetes Program Recommendations  AACE/ADA: New Consensus Statement on Inpatient Glycemic Control (2015)  Target Ranges:  Prepandial:   less than 140 mg/dL      Peak postprandial:   less than 180 mg/dL (1-2 hours)      Critically ill patients:  140 - 180 mg/dL   Lab Results  Component Value Date   GLUCAP 221 (H) 07/31/2019   HGBA1C 8.0 (H) 07/23/2019    Review of Glycemic Control Results for [name], [name] (MRN [id number]) as of 07/31/2019 11:56  Ref. Range 07/30/2019 07:34 07/30/2019 11:24 07/30/2019 16:28 07/30/2019 21:08 07/31/2019 07:29 07/31/2019 11:38  Glucose-Capillary Latest Ref Range: 70 - 99 mg/dL 280 (H) 323 (H) 315 (H) 322 (H) 173 (H) 221 (H)  Diabetes history: DM 2 Outpatient Diabetes medications: Novolog 30 units tid, Levemir 80 units qhs Current orders for Inpatient glycemic control:  Levemir 70 units qhs, Novolog 0-6 units tid with meals  Inpatient Diabetes Program Recommendations:    Please add Novolog 4 units tid with meals (hold if patient eats less than 50%).   Thanks,  Adah Perl, RN, BC-ADM Inpatient Diabetes Coordinator Pager [phone]

## 2019-07-31 NOTE — Care Management Important Message (Signed)
Important Message  Patient Details  Name: [name] MRN: [id number] Date of Birth: 1935-04-11   Medicare Important Message Given:  Yes     Tommy Medal 07/31/2019, 3:07 PM

## 2019-07-31 NOTE — Discharge Summary (Addendum)
[name], is a 85 y.o. female  DOB February 04, 1935  MRN [id number].  Admission date:  07/23/2019  Admitting Physician  Roxan Hockey, MD  Discharge Date:  07/31/2019   Primary MD  [name], MD  Recommendations for primary care physician for things to follow:   1)Very low-salt diet advised 2)Weigh yourself daily, call if you gain more than 3 pounds in 1 day or more than 5 pounds in 1 week as your diuretic medications may need to be adjusted 3)Limit your Fluid  intake to no more than 60 ounces (1.8 Liters) per day 4) you are taking apixaban/Eliquis so Avoid ibuprofen/Advil/Aleve/Motrin/Goody Powders/Naproxen/BC powders/Meloxicam/Diclofenac/Indomethacin and other Nonsteroidal anti-inflammatory medications as these will make you more likely to bleed and can cause stomach ulcers, can also cause Kidney problems.  5) repeat CBC and BMP every Thursday for 2 weeks 6)insulin aspart (novoLOG) injection 0-10 Units  0-10 Units Subcutaneous, 3 times daily with meals  CBG < 70: Implement Hypoglycemia Standing Orders and refer to Hypoglycemia Standing Orders sidebar report   CBG 70 - 120: 0 unit CBG 121 - 150: 0 unit  CBG 151 - 200: 1 unit  CBG 201 - 250: 2 units  CBG 251 - 300: 4 units  CBG 301 - 350: 6 units   CBG 351 - 400: 8 units  CBG > 400: 10 units  Admission Diagnosis  Pancreatitis, acute [K85.90] Acute pulmonary edema (HCC) [J81.0] Acute pancreatitis, unspecified complication status, unspecified pancreatitis type [K85.90]   Discharge Diagnosis  Pancreatitis, acute [K85.90] Acute pulmonary edema (HCC) [J81.0] Acute pancreatitis, unspecified complication status, unspecified pancreatitis type [K85.90]    Principal Problem:   Pancreatitis, acute Active Problems:   New onset a-fib ([place])   Uncontrolled type 2 diabetes mellitus with diabetic nephropathy, with long-term current use of insulin (HCC)    Diabetes mellitus type 2 with complications (HCC)   Chronic diastolic CHF (congestive heart failure) (HCC)/EF > 60 %   Acute respiratory failure with hypoxemia (HCC)   CKD (chronic kidney disease), stage IIIb   Essential hypertension   Acute respiratory failure with hypercapnia (HCC)   AKI (acute kidney injury) ([place])   Acute pulmonary edema (HCC)      Past Medical History:  Diagnosis Date  . Arthritis   . Chronic diastolic heart failure ([place])   . CKD (chronic kidney disease) stage 3, GFR 30-59 ml/min   . Essential hypertension   . GERD (gastroesophageal reflux disease)   . Hemorrhoids   . History of stroke   . Type 2 diabetes mellitus ([place])     Past Surgical History:  Procedure Laterality Date  . BACK SURGERY    . CATARACT EXTRACTION W/PHACO Left 02/28/2013   Procedure: CATARACT EXTRACTION PHACO AND INTRAOCULAR LENS PLACEMENT (IOL) CDE=15.60;  Surgeon: Elta Guadeloupe T. Gershon Crane, MD;  Location: [facility];  Service: Ophthalmology;  Laterality: Left;  . CHOLECYSTECTOMY    . COLONOSCOPY  12/12/2003   RMR: Normal rectum.  Long capacious tortuous colon, colonic mucosa appeared normal  . COLONOSCOPY N/A 03/21/2014  Dr. [name]: internal and external hemorrhoids, torturous colon, colonic diverticulosis   . ESOPHAGOGASTRODUODENOSCOPY  12/28/2001   NOB:SJGGE sliding hiatal hernia/. Three small bulbar ulcers, two with stigmata of bleeding and these were coagulated using heater probe unit   . ESOPHAGOGASTRODUODENOSCOPY N/A 03/21/2014   Dr. [name]: cervical esophageal web s/p dilation, negative H.pylori   . EYE SURGERY    . HIP FRACTURE SURGERY  2008  . JOINT REPLACEMENT     Rt hip, ????? sounds like just for fracture  . MALONEY DILATION N/A 03/21/2014   Procedure: [name] DILATION;  Surgeon: [name], MD;  Location: [facility];  Service: Endoscopy;  Laterality: N/A;  . SAVORY DILATION N/A 03/21/2014   Procedure: SAVORY DILATION;  Surgeon: [name], MD;  Location: [facility];  Service:  Endoscopy;  Laterality: N/A;       HPI  from the history and physical done on the day of admission:    [name]  is a 85 y.o. female with past medical history relevant for HTN, DM, CKD 3B, dCHF , chronic LBB and chronic lower extremity edema who presents to the ED with epigastric pain associated with nausea and vomiting -Emesis is without blood or bile -Patient denies diarrhea, no fevers no chills -Patient was found to be wheezing, patient denies significant dyspnea per se -She does have chronic lower extremity edema  -In ED patient found to have an irregularly irregular rhythm--with heart rate  Up to 130 range... Patient responded well to IV Cardizem followed by p.o. Cardizem -CT chest without PE however does show evidence of right-sided heart failure -CT abdomen and pelvis consistent with acute pancreatitis, lipase is found to be 2264 -UA suspicious for UTI and WBC is 11.9   Hospital Course:     Brief Narrative:  85 year old female with a history of hypertension, diabetes, chronic kidney disease stage III, diastolic heart failure, admitted to the hospital with acute pancreatitis.  She was also noted to have new onset atrial fibrillation.  Started on IV hydration.  Started on Cardizem for rate control and Eliquis for anticoagulation.  She developed acute kidney injury on chronic kidney disease.  Shortly after admission, she was noted to be increasingly somnolent.  Blood gas was checked and she was noted to to have increased pCO2.  She was transferred to stepdown unit and started on BiPAP.  Assessment & Plan:   Principal Problem:   Pancreatitis, acute Active Problems:   New onset a-fib ([employer])   Uncontrolled type 2 diabetes mellitus with diabetic nephropathy, with long-term current use of insulin (HCC)   Diabetes mellitus type 2 with complications (HCC)   Chronic diastolic CHF (congestive heart failure) (HCC)/EF > 60 %   Acute respiratory failure with hypoxemia (HCC)   CKD  (chronic kidney disease), stage IIIb   Essential hypertension   Acute respiratory failure with hypercapnia (HCC)   AKI (acute kidney injury) ([place])   Acute pulmonary edema ([place])  Brief Summary:- 85 y.o.femalewith past medical history relevant for HTN, DM, CKD 3B,dCHF ,chronic LBBand chronic lower extremity edema admitted on 07/23/2019 with acute pancreatitis, UTI and atrial fibrillation -subsequently developed hypercapnic respiratory failure requiring transfer to stepdown unit for BiPAP use -Clinically and radiologically improved -Hypoxia has resolved, no longer requiring oxygen or CPAP   A/p 1)Acute Pancreatitis- She is status post prior cholecystectomy. No calcified intraductal gallstones identified on imaging.  No history of alcohol use.    -Tolerating GI soft diet okay, no emesis or significant abdominal pain  2)New onset atrial fibrillation.  CHADSVASc of 8.  She is currently on oral diltiazem CD 120 mg  for rate control.   cardiology input appreciated,  -continue Eliquis 2.5 mg twice daily for stroke prophylaxis  -Appears back in sinus rhythm -Echo from 07/08/2019 with EF of 60 to 65% -Nuclear stress test from 07/18/2019 without reversible ischemia  3) E. coli UTI ----stopped Rocephin, treated with Levaquin per sensitivity report , last dose 07/30/2019  4)Acute respiratory failure with hypoxia and hypercarbia--- opiate probably contributed, off opiates, pulmonology consult from Dr. Melvyn Novas appreciated, holding off on possible thoracentesis--- patient developed further respiratory distress with increased work of breathing on 07/27/2019 through 07/28/2019, improved on BiPAP. -Currently off BiPAP --Initial chest x-ray suggestive of pulmonary edema, additional IV Lasix given -Improved on Lasix IV, okay to discharge on torsemide 20 twice daily -Hypoxia has resolved, no longer requiring oxygen or CPAP -Repeat chest x-ray on 07/31/2019 after additional diuresis showed significant  improvement  5)Acute on chronic diastolic congestive heart failure-right-sided heart failure symptoms noted, additional diuresis as above in #4 -Much improved clinically and radiologically after IV diuresis -Weight is down to 218 pounds from a peak of 242 pounds on 07/27/2019 -Fluid balance remains significantly negative -Continue hydralazine/Imdur combo -Hypoxia has resolved, no longer requiring oxygen or CPAP  6)AKI----acute kidney injury on CKD stage -  IIIb--- creatinine on admission= 1.54 , baseline creatinine =  1.4 to 1.5   , creatinine is now=1.49 (peak was 2.14)  , renally adjust medications, avoid nephrotoxic agents / dehydration  / hypotension Creatinine initially bumped up this admission.  Possibly related to acute pancreatitis.  She also did receive IV contrast on admission. -- -Repeat BMP every Thursday times day x2 weeks advised  7)Diabetes---  She is chronically on high-dose Levemir.  --Blood sugars trending up, increase Levemir to 70 units from 60 units, PTA patient was on 80 units nightly- -okay to restart NovoLog at 10 units 3 times daily, PTA patient was on 30 units 3 times daily -- Use Novolog/Humalog Sliding scale insulin with Accu-Cheks/Fingersticks as ordered   8)HTN--- continue Cardizem CD 120 mg, resume home dose of hydralazine at 75 mg twice daily, isosorbide 30 mg daily added   9)Abnormal imaging findings. Imaging findings suggest thyroid nodule and possible ---outpatient follow-up with PCP for further investigation/work-up strongly advised  10)Morbid obesity/possible OSA--  BMI was> 40.    After aggressive diuresis BMI is now 37.5 -Given documented history of hypercapnia patient needs outpatient sleep study  11)Generalized weakness/deconditioning--PT eval appreciated recommends SNF rehab  12) acute on chronic anemia--normocytic and normochromic, on admission hemoglobin was 11.1 which is close to patient's previous baseline, hemoglobin currently stable  above 10 -Monitor closely with Eliquis use -Weekly CBC every Thursday for 2 weeks advised     DVT prophylaxis: Eliquis Code Status: Full code Family Communication: Discussed with patient's daughter [name]  Disposition --- discharge to SNF with O2 at 2 L/min  Consultants:   Cardiology/pulmonology  Procedures:     Antimicrobials:   Ceftriaxone 3/7 > thru 3/9  -Levaquin started on 07/26/2019, 07/30/19  Discharge Condition: Stable  Follow UP  Contact information for after-discharge care    [facility] .   Service: Skilled Nursing Contact information: [place] [phone]              Diet and Activity recommendation:  As advised  Discharge Instructions    Discharge Instructions    Call  MD for:  difficulty breathing, headache or visual disturbances   Complete by: As directed    Call MD for:  extreme fatigue   Complete by: As directed    Call MD for:  persistant dizziness or light-headedness   Complete by: As directed    Call MD for:  persistant nausea and vomiting   Complete by: As directed    Call MD for:  severe uncontrolled pain   Complete by: As directed    Call MD for:  temperature >100.4   Complete by: As directed    Diet - low sodium heart healthy   Complete by: As directed    Diet Carb Modified   Complete by: As directed    Discharge instructions   Complete by: As directed    1)Very low-salt diet advised 2)Weigh yourself daily, call if you gain more than 3 pounds in 1 day or more than 5 pounds in 1 week as your diuretic medications may need to be adjusted 3)Limit your Fluid  intake to no more than 60 ounces (1.8 Liters) per day 4) you are taking apixaban/Eliquis so Avoid ibuprofen/Advil/Aleve/Motrin/Goody Powders/Naproxen/BC powders/Meloxicam/Diclofenac/Indomethacin and other Nonsteroidal anti-inflammatory medications as these will make you more likely to  bleed and can cause stomach ulcers, can also cause Kidney problems.  5) repeat CBC and BMP every Thursday for 2 weeks 6)insulin aspart (novoLOG) injection 0-10 Units  0-10 Units Subcutaneous, 3 times daily with meals  CBG < 70: Implement Hypoglycemia Standing Orders and refer to Hypoglycemia Standing Orders sidebar report   CBG 70 - 120: 0 unit CBG 121 - 150: 0 unit  CBG 151 - 200: 1 unit  CBG 201 - 250: 2 units  CBG 251 - 300: 4 units  CBG 301 - 350: 6 units   CBG 351 - 400: 8 units  CBG > 400: 10 units   Increase activity slowly   Complete by: As directed         Discharge Medications     Allergies as of 07/31/2019   No Known Allergies     Medication List    STOP taking these medications   potassium chloride SA 20 MEQ tablet Commonly known as: KLOR-CON     TAKE these medications   acetaminophen 325 MG tablet Commonly known as: TYLENOL Take 2 tablets (650 mg total) by mouth every 6 (six) hours as needed for mild pain, fever or headache (or Fever >/= 101).   apixaban 2.5 MG Tabs tablet Commonly known as: ELIQUIS Take 1 tablet (2.5 mg total) by mouth 2 (two) times daily.   diltiazem 120 MG 24 hr capsule Commonly known as: CARDIZEM CD Take 1 capsule (120 mg total) by mouth daily. Start taking on: August 01, 2019   fluticasone 50 MCG/ACT nasal spray Commonly known as: FLONASE Place 1 spray into both nostrils daily.   gabapentin 300 MG capsule Commonly known as: NEURONTIN Take 300 mg by mouth 2 (two) times daily.   hydrALAZINE 50 MG tablet Commonly known as: APRESOLINE Take 1.5 tablets (75 mg total) by mouth 2 (two) times daily.   hydrocortisone 2.5 % rectal cream Commonly known as: ANUSOL-HC Place 1 application rectally 2 (two) times daily.   insulin aspart 100 UNIT/ML injection Commonly known as: novoLOG Inject 10 Units into the skin 3 (three) times daily before meals. What changed: how much to take   insulin aspart 100 UNIT/ML FlexPen Commonly known  as: NOVOLOG Inject 0-10 Units into the skin 3 (three) times daily with  meals. insulin aspart (novoLOG) injection 0-10 Units 0-10 Units Subcutaneous, 3 times daily with meals CBG < 70: Implement Hypoglycemia Standing Orders and refer to Hypoglycemia Standing Orders sidebar report  CBG 70 - 120: 0 unit CBG 121 - 150: 0 unit  CBG 151 - 200: 1 unit CBG 201 - 250: 2 units CBG 251 - 300: 4 units CBG 301 - 350: 6 units  CBG 351 - 400: 8 units  CBG > 400: 10 units What changed: You were already taking a medication with the same name, and this prescription was added. Make sure you understand how and when to take each.   insulin detemir 100 UNIT/ML injection Commonly known as: LEVEMIR Inject 0.7 mLs (70 Units total) into the skin at bedtime. What changed: how much to take   isosorbide mononitrate 30 MG 24 hr tablet Commonly known as: IMDUR Take 1 tablet (30 mg total) by mouth daily.   pantoprazole 40 MG tablet Commonly known as: PROTONIX TAKE 1 TABLET BY MOUTH ONCE DAILY.   polyethylene glycol 17 g packet Commonly known as: MIRALAX / GLYCOLAX Take 17 g by mouth daily. Start taking on: August 01, 2019   simvastatin 20 MG tablet Commonly known as: ZOCOR Take 20 mg by mouth daily.   torsemide 20 MG tablet Commonly known as: DEMADEX Take 1 tablet (20 mg total) by mouth 2 (two) times daily. What changed: when to take this       Major procedures and Radiology Reports - PLEASE review detailed and final reports for all details, in brief -   DG Chest 2 View  Result Date: 07/31/2019 CLINICAL DATA:  Shortness of breath. Dyspnea and respiratory abnormalities. EXAM: CHEST - 2 VIEW COMPARISON:  Radiograph 07/28/2019. CT 07/23/2019 FINDINGS: Cardiomegaly, unchanged. Improved pulmonary edema with mild residual. Small bilateral pleural effusions and basilar atelectasis, improved. No confluent airspace disease. No pneumothorax. No acute osseous abnormalities are seen. IMPRESSION: Improved pulmonary edema  and pleural effusions. Stable cardiomegaly. Electronically Signed   By: Keith Rake M.D.   On: 07/31/2019 13:45   CT Angio Chest PE W and/or Wo Contrast  Result Date: 07/23/2019 CLINICAL DATA:  Shortness of breath, chest pain, abdominal pain began at 03:00 EXAM: CT ANGIOGRAPHY CHEST CT ABDOMEN AND PELVIS WITH CONTRAST TECHNIQUE: Multidetector CT imaging of the chest was performed using the standard protocol during bolus administration of intravenous contrast. Multiplanar CT image reconstructions and MIPs were obtained to evaluate the vascular anatomy. Multidetector CT imaging of the abdomen and pelvis was performed using the standard protocol during bolus administration of intravenous contrast. CONTRAST:  54mL OMNIPAQUE IOHEXOL 350 MG/ML SOLN COMPARISON:  Chest radiograph 90/24/0973, CT renal colic 53/29/9242, CT abdomen pelvis 05/06/2018 FINDINGS: CTA CHEST FINDINGS Cardiovascular: Satisfactory opacification the pulmonary arteries to the segmental level. No pulmonary artery filling defects are identified. Central pulmonary arteries are normal caliber. Atherosclerotic plaque within the normal caliber aorta. No intramural hematoma, dissection flap or other acute luminal abnormality of the aorta is seen. No periaortic stranding or hemorrhage. Atherosclerotic calcification of the coronary arteries. Cardiac size at the upper limits of normal. Small pericardial effusion and fluid within the pericardial recesses. Reflux of contrast into the IVC and mildly within the hepatic veins. Remaining major venous structures are unremarkable. Mediastinum/Nodes: Heterogeneous enlarged and nodular thyroid gland. Dominant nodule in the right lobe measures up to 2.5 cm (2/12) low-attenuation fluid is seen within the pericardial recesses. Few borderline enlarged low-attenuation mediastinal and hilar nodes are present instance a 10 mm left hilar  node (2/86), and an 11 mm right hilar node (2/34), and a 16 mm subcarinal node (2/39).  No acute abnormality of the trachea or esophagus. No worrisome axillary or visible supraclavicular adenopathy. Lungs/Pleura: Small bilateral effusions. Adjacent areas of passive atelectasis. Interlobular septal thickening noted towards the apices and lung bases. Redistributed pulmonary vascularity. Central airways thickening/cuffing. Some more patchy ground-glass towards the lung bases, favor edema. Musculoskeletal: Diffusely thickened cortex and trabecular coarsening of the left tenth rib with mild expansile changes. Mild dextrocurvature of the thoracic spine. Multilevel degenerative changes are present in the imaged portions of the spine. Additional degenerative changes in the shoulders. Review of the MIP images confirms the above findings. CT ABDOMEN and PELVIS FINDINGS Hepatobiliary: No focal liver abnormality is seen. Patient is post cholecystectomy. Slight prominence of the biliary tree likely related to reservoir effect. No calcified intraductal gallstones. Pancreas: There is slight prominence of the pancreatic duct measuring up to 5 mm the head and 3 mm at the tail/body junction. Uniform pancreatic enhancement with mild diffuse peripancreatic inflammation most focally upon the pancreatic head and uncinate process. No organized or encapsulated fluid collections are seen. Spleen: Normal in size without focal abnormality. Adrenals/Urinary Tract: Mild nodular thickening of the adrenal glands, similar to priors. Intermediate attenuation 1.5 cm cystic lesion in the anterior interpolar left kidney. Additional subcentimeter hypoattenuating renal lesions both kidneys. Renal vascular calcium is noted. Suspect few nonobstructing calculi in the collecting system as well. No obstructive urolithiasis or hydronephrosis. Urinary bladder is unremarkable. Stomach/Bowel: Distal esophagus and stomach are unremarkable. Mild periduodenal inflammatory changes of the first through third portions of the duodenum. Favor reactive in  the absence of wall thickening or dilatation. No distal small bowel abnormality is seen. No evidence of obstruction. A normal appendix is visualized. No colonic dilatation or wall thickening. Scattered colonic diverticula without focal pericolonic inflammation to suggest diverticulitis. Vascular/Lymphatic: Atherosclerotic plaque within the normal caliber aorta and branch vessels. No suspicious or enlarged lymph nodes in the included lymphatic chains. Reproductive: Anteverted uterus. No concerning adnexal lesions. Other: Mild ventral diastasis with a superimposed fat containing umbilical hernia. No bowel containing hernias. Soft tissue thickening along the anterior abdominal wall of the appearance most compatible with localized injectable use related to insulin. Musculoskeletal: Multilevel moderate degenerative changes of the imaged thoracolumbar spine. More moderate to severe degenerative changes in the SI joints and symphysis pubis. Prior right femoral lateral plate and screw construct with transcervical pending. Some articular surface collapse near the terminus of the transcervical fixation screw in the superior right femoral head may reflect some articular surface collapse or avascular necrosis. Review of the MIP images confirms the above findings. IMPRESSION: CTA CHEST 1. No evidence of acute pulmonary artery filling defect to suggest pulmonary embolism. 2. Features most suggestive of likely cardiogenic pulmonary edema with small bilateral effusions and reflux of contrast into the hepatic veins which can suggest some right-sided heart dysfunction or elevated right heart pressures. 3. Borderline enlarged low-attenuation mediastinal and hilar nodes, likely reactive/edematous. 4. Heterogeneous enlarged and nodular thyroid gland. Dominant right thyroid nodule measures 2.5 cm. Recommend further evaluation with nonemergent thyroid ultrasound. This follows consensus guidelines: Managing Incidental Thyroid Nodules  Detected on Imaging: White Paper of the ACR Incidental Thyroid Findings Committee. J Am Coll Radiol 2015; 12:143-150. and [facility] 3-tiered system for managing ITNs: J Am Coll Radiol. 2015; Feb;12(2): 143-50 5. Diffusely thickened cortex and trabecular coarsening of the left tenth rib with mild expansile changes. This finding is favored to represent Paget's disease,  less likely Paget's disease. CT ABDOMEN AND PELVIS 1. Mild diffuse peripancreatic inflammation compatible with acute pancreatitis particularly given the setting of a elevated lipase. No evidence of pancreatic necrosis, significant free fluid or organized pancreatic collections. Paraduodenal inflammation is likely reactive. 2. Mild pancreatic ductal dilatation, can be related to the acute pancreatic process. Attention on follow-up imaging is recommended. If persistently dilated, MRCP could be obtained. 3. Indeterminate intermediate attenuation 1.5 cm cyst in the left kidney, consider outpatient, nonemergent renal ultrasound. 4. Prior right femoral transcervical fixation with some sclerotic changes adjacent the tip of the transcervical fixation screw extending to the articular surface of the femoral head. May reflect avascular necrosis. 5. Ventral diastasis with a superimposed fat containing umbilical hernia. 6.  Aortic Atherosclerosis (ICD10-I70.0). These results were called by telephone at the time of interpretation on 07/23/2019 at 5:29 pm to provider [facility] , who verbally acknowledged these results. Electronically Signed   By: Lovena Le M.D.   On: 07/23/2019 17:29   CT ABDOMEN PELVIS W CONTRAST  Result Date: 07/23/2019 CLINICAL DATA:  Shortness of breath, chest pain, abdominal pain began at 03:00 EXAM: CT ANGIOGRAPHY CHEST CT ABDOMEN AND PELVIS WITH CONTRAST TECHNIQUE: Multidetector CT imaging of the chest was performed using the standard protocol during bolus administration of intravenous contrast. Multiplanar CT image reconstructions and MIPs were  obtained to evaluate the vascular anatomy. Multidetector CT imaging of the abdomen and pelvis was performed using the standard protocol during bolus administration of intravenous contrast. CONTRAST:  39mL OMNIPAQUE IOHEXOL 350 MG/ML SOLN COMPARISON:  Chest radiograph 58/01/9832, CT renal colic 82/50/5397, CT abdomen pelvis 05/06/2018 FINDINGS: CTA CHEST FINDINGS Cardiovascular: Satisfactory opacification the pulmonary arteries to the segmental level. No pulmonary artery filling defects are identified. Central pulmonary arteries are normal caliber. Atherosclerotic plaque within the normal caliber aorta. No intramural hematoma, dissection flap or other acute luminal abnormality of the aorta is seen. No periaortic stranding or hemorrhage. Atherosclerotic calcification of the coronary arteries. Cardiac size at the upper limits of normal. Small pericardial effusion and fluid within the pericardial recesses. Reflux of contrast into the IVC and mildly within the hepatic veins. Remaining major venous structures are unremarkable. Mediastinum/Nodes: Heterogeneous enlarged and nodular thyroid gland. Dominant nodule in the right lobe measures up to 2.5 cm (2/12) low-attenuation fluid is seen within the pericardial recesses. Few borderline enlarged low-attenuation mediastinal and hilar nodes are present instance a 10 mm left hilar node (2/86), and an 11 mm right hilar node (2/34), and a 16 mm subcarinal node (2/39). No acute abnormality of the trachea or esophagus. No worrisome axillary or visible supraclavicular adenopathy. Lungs/Pleura: Small bilateral effusions. Adjacent areas of passive atelectasis. Interlobular septal thickening noted towards the apices and lung bases. Redistributed pulmonary vascularity. Central airways thickening/cuffing. Some more patchy ground-glass towards the lung bases, favor edema. Musculoskeletal: Diffusely thickened cortex and trabecular coarsening of the left tenth rib with mild expansile  changes. Mild dextrocurvature of the thoracic spine. Multilevel degenerative changes are present in the imaged portions of the spine. Additional degenerative changes in the shoulders. Review of the MIP images confirms the above findings. CT ABDOMEN and PELVIS FINDINGS Hepatobiliary: No focal liver abnormality is seen. Patient is post cholecystectomy. Slight prominence of the biliary tree likely related to reservoir effect. No calcified intraductal gallstones. Pancreas: There is slight prominence of the pancreatic duct measuring up to 5 mm the head and 3 mm at the tail/body junction. Uniform pancreatic enhancement with mild diffuse peripancreatic inflammation most focally upon the pancreatic  head and uncinate process. No organized or encapsulated fluid collections are seen. Spleen: Normal in size without focal abnormality. Adrenals/Urinary Tract: Mild nodular thickening of the adrenal glands, similar to priors. Intermediate attenuation 1.5 cm cystic lesion in the anterior interpolar left kidney. Additional subcentimeter hypoattenuating renal lesions both kidneys. Renal vascular calcium is noted. Suspect few nonobstructing calculi in the collecting system as well. No obstructive urolithiasis or hydronephrosis. Urinary bladder is unremarkable. Stomach/Bowel: Distal esophagus and stomach are unremarkable. Mild periduodenal inflammatory changes of the first through third portions of the duodenum. Favor reactive in the absence of wall thickening or dilatation. No distal small bowel abnormality is seen. No evidence of obstruction. A normal appendix is visualized. No colonic dilatation or wall thickening. Scattered colonic diverticula without focal pericolonic inflammation to suggest diverticulitis. Vascular/Lymphatic: Atherosclerotic plaque within the normal caliber aorta and branch vessels. No suspicious or enlarged lymph nodes in the included lymphatic chains. Reproductive: Anteverted uterus. No concerning adnexal  lesions. Other: Mild ventral diastasis with a superimposed fat containing umbilical hernia. No bowel containing hernias. Soft tissue thickening along the anterior abdominal wall of the appearance most compatible with localized injectable use related to insulin. Musculoskeletal: Multilevel moderate degenerative changes of the imaged thoracolumbar spine. More moderate to severe degenerative changes in the SI joints and symphysis pubis. Prior right femoral lateral plate and screw construct with transcervical pending. Some articular surface collapse near the terminus of the transcervical fixation screw in the superior right femoral head may reflect some articular surface collapse or avascular necrosis. Review of the MIP images confirms the above findings. IMPRESSION: CTA CHEST 1. No evidence of acute pulmonary artery filling defect to suggest pulmonary embolism. 2. Features most suggestive of likely cardiogenic pulmonary edema with small bilateral effusions and reflux of contrast into the hepatic veins which can suggest some right-sided heart dysfunction or elevated right heart pressures. 3. Borderline enlarged low-attenuation mediastinal and hilar nodes, likely reactive/edematous. 4. Heterogeneous enlarged and nodular thyroid gland. Dominant right thyroid nodule measures 2.5 cm. Recommend further evaluation with nonemergent thyroid ultrasound. This follows consensus guidelines: Managing Incidental Thyroid Nodules Detected on Imaging: White Paper of the ACR Incidental Thyroid Findings Committee. J Am Coll Radiol 2015; 12:143-150. and [facility] 3-tiered system for managing ITNs: J Am Coll Radiol. 2015; Feb;12(2): 143-50 5. Diffusely thickened cortex and trabecular coarsening of the left tenth rib with mild expansile changes. This finding is favored to represent Paget's disease, less likely Paget's disease. CT ABDOMEN AND PELVIS 1. Mild diffuse peripancreatic inflammation compatible with acute pancreatitis particularly given  the setting of a elevated lipase. No evidence of pancreatic necrosis, significant free fluid or organized pancreatic collections. Paraduodenal inflammation is likely reactive. 2. Mild pancreatic ductal dilatation, can be related to the acute pancreatic process. Attention on follow-up imaging is recommended. If persistently dilated, MRCP could be obtained. 3. Indeterminate intermediate attenuation 1.5 cm cyst in the left kidney, consider outpatient, nonemergent renal ultrasound. 4. Prior right femoral transcervical fixation with some sclerotic changes adjacent the tip of the transcervical fixation screw extending to the articular surface of the femoral head. May reflect avascular necrosis. 5. Ventral diastasis with a superimposed fat containing umbilical hernia. 6.  Aortic Atherosclerosis (ICD10-I70.0). These results were called by telephone at the time of interpretation on 07/23/2019 at 5:29 pm to provider [facility] , who verbally acknowledged these results. Electronically Signed   By: Lovena Le M.D.   On: 07/23/2019 17:29   DG CHEST PORT 1 VIEW  Result Date: 07/28/2019 CLINICAL DATA:  Worsening shortness of breath. Chronic kidney disease. Diabetes and hypertension. EXAM: PORTABLE CHEST 1 VIEW COMPARISON:  07/27/2019 FINDINGS: Cardiomegaly stable. Diffuse interstitial infiltrates show no significant change. Increased opacity at both lung bases likely due to increased atelectasis and small pleural effusions. IMPRESSION: 1. Increased bibasilar atelectasis and probable small pleural effusions. 2. Stable cardiomegaly and diffuse interstitial infiltrates, suspicious for pulmonary edema. Electronically Signed   By: Marlaine Hind M.D.   On: 07/28/2019 13:43   DG Chest Port 1 View  Result Date: 07/27/2019 CLINICAL DATA:  Acute respiratory failure EXAM: PORTABLE CHEST 1 VIEW COMPARISON:  07/25/2019 FINDINGS: The heart is mildly enlarged but stable. Stable prominent mediastinal and hilar contours. Progressive  interstitial and patchy nodular airspace process in the lungs. No pleural effusions or pneumothorax. IMPRESSION: Progressive interstitial and patchy nodular airspace process. Electronically Signed   By: Marijo Sanes M.D.   On: 07/27/2019 06:42   DG CHEST PORT 1 VIEW  Result Date: 07/25/2019 CLINICAL DATA:  Shortness of breath. EXAM: PORTABLE CHEST 1 VIEW COMPARISON:  07/23/2019 FINDINGS: The heart is enlarged but appears stable. Progressive central vascular congestion and probable mild interstitial edema. No pleural effusions or focal infiltrates. The bony thorax is intact. IMPRESSION: Cardiac enlargement with progressive vascular congestion and probable mild interstitial edema. Electronically Signed   By: Marijo Sanes M.D.   On: 07/25/2019 05:08   DG Chest Portable 1 View  Result Date: 07/23/2019 CLINICAL DATA:  Wheezing. Abdominal pain since 3 a.m. today with a single episode of vomiting. EXAM: PORTABLE CHEST 1 VIEW COMPARISON:  06/23/2019 FINDINGS: Stable enlarged heart. Breathing motion blurring with a suggestion of small areas of patchy density in both lungs. No pleural fluid. Unremarkable bones. The patient's chin is obscuring medial lung apices. IMPRESSION: 1. Stable cardiomegaly. 2. Possible small areas of pneumonia in both lungs, difficult to assess due to breathing motion blurring. Electronically Signed   By: Claudie Revering M.D.   On: 07/23/2019 14:14   Myocardial Perfusion Imaging  Result Date: 07/18/2019  Nuclear stress EF: 55%.  The left ventricular ejection fraction is normal (55-65%).  There was no ST segment deviation noted during stress.  There is a medium defect of moderate severity present in the basal anterior, basal anteroseptal, mid anterior and mid anteroseptal location. The defect is non-reversible and consistent with breast attenuation artifact.  This is a low risk study.    Micro Results   Recent Results (from the past 240 hour(s))  SARS CORONAVIRUS 2 (TAT 6-24 HRS)  Nasopharyngeal Nasopharyngeal Swab     Status: None   Collection Time: 07/23/19  1:32 PM   Specimen: Nasopharyngeal Swab  Result Value Ref Range Status   SARS Coronavirus 2 NEGATIVE NEGATIVE Final    Comment: (NOTE) SARS-CoV-2 target nucleic acids are NOT DETECTED. The SARS-CoV-2 RNA is generally detectable in upper and lower respiratory specimens during the acute phase of infection. Negative results do not preclude SARS-CoV-2 infection, do not rule out co-infections with other pathogens, and should not be used as the sole basis for treatment or other patient management decisions. Negative results must be combined with clinical observations, patient history, and epidemiological information. The expected result is Negative. Fact Sheet for Patients: SugarRoll.be Fact Sheet for Healthcare Providers: https://www.woods-mathews.com/ This test is not yet approved or cleared by the [place] FDA and  has been authorized for detection and/or diagnosis of SARS-CoV-2 by FDA under an Emergency Use Authorization (EUA). This EUA will remain  in effect (meaning this test can be  used) for the duration of the COVID-19 declaration under Section 56 4(b)(1) of the Act, 21 U.S.C. section 360bbb-3(b)(1), unless the authorization is terminated or revoked sooner. Performed at [facility], [place] [address] [place], [place] [address]   Urine culture     Status: Abnormal   Collection Time: 07/23/19  2:26 PM   Specimen: Urine, Clean Catch  Result Value Ref Range Status   Specimen Description   Final    URINE, CLEAN CATCH Performed at [facility], [address], [place], [place] [address]    Special Requests   Final    NONE Performed at [facility], [address], [place], [place] [address]    Culture >=100,000 COLONIES/mL ESCHERICHIA COLI (A)  Final   Report Status 07/26/2019 FINAL  Final   Organism ID, Bacteria ESCHERICHIA COLI (A)  Final       Susceptibility   Escherichia coli - MIC*    AMPICILLIN >=32 RESISTANT Resistant     CEFAZOLIN >=64 RESISTANT Resistant     CEFTRIAXONE 2 INTERMEDIATE Intermediate     CIPROFLOXACIN <=0.25 SENSITIVE Sensitive     GENTAMICIN <=1 SENSITIVE Sensitive     IMIPENEM <=0.25 SENSITIVE Sensitive     NITROFURANTOIN <=16 SENSITIVE Sensitive     TRIMETH/SULFA <=20 SENSITIVE Sensitive     AMPICILLIN/SULBACTAM >=32 RESISTANT Resistant     PIP/TAZO 8 SENSITIVE Sensitive     * >=100,000 COLONIES/mL ESCHERICHIA COLI  MRSA PCR Screening     Status: None   Collection Time: 07/24/19  8:44 PM   Specimen: Nasal Mucosa; Nasopharyngeal  Result Value Ref Range Status   MRSA by PCR NEGATIVE NEGATIVE Final    Comment:        The GeneXpert MRSA Assay (FDA approved for NASAL specimens only), is one component of a comprehensive MRSA colonization surveillance program. It is not intended to diagnose MRSA infection nor to guide or monitor treatment for MRSA infections. Performed at [facility], [address] [place], [place] [address]   Respiratory Panel by RT PCR (Flu A&B, Covid) - Nasopharyngeal Swab     Status: None   Collection Time: 07/31/19 11:29 AM   Specimen: Nasopharyngeal Swab  Result Value Ref Range Status   SARS Coronavirus 2 by RT PCR NEGATIVE NEGATIVE Final    Comment: (NOTE) SARS-CoV-2 target nucleic acids are NOT DETECTED. The SARS-CoV-2 RNA is generally detectable in upper respiratoy specimens during the acute phase of infection. The lowest concentration of SARS-CoV-2 viral copies this assay can detect is 131 copies/mL. A negative result does not preclude SARS-Cov-2 infection and should not be used as the sole basis for treatment or other patient management decisions. A negative result may occur with  improper specimen collection/handling, submission of specimen other than nasopharyngeal swab, presence of viral mutation(s) within the areas targeted by this assay, and inadequate number  of viral copies (<131 copies/mL). A negative result must be combined with clinical observations, patient history, and epidemiological information. The expected result is Negative. Fact Sheet for Patients:  PinkCheek.be Fact Sheet for Healthcare Providers:  GravelBags.it This test is not yet ap proved or cleared by the [place] FDA and  has been authorized for detection and/or diagnosis of SARS-CoV-2 by FDA under an Emergency Use Authorization (EUA). This EUA will remain  in effect (meaning this test can be used) for the duration of the COVID-19 declaration under Section 564(b)(1) of the Act, 21 U.S.C. section 360bbb-3(b)(1), unless the authorization is terminated or revoked sooner.  Influenza A by PCR NEGATIVE NEGATIVE Final   Influenza B by PCR NEGATIVE NEGATIVE Final    Comment: (NOTE) The Xpert Xpress SARS-CoV-2/FLU/RSV assay is intended as an aid in  the diagnosis of influenza from Nasopharyngeal swab specimens and  should not be used as a sole basis for treatment. Nasal washings and  aspirates are unacceptable for Xpert Xpress SARS-CoV-2/FLU/RSV  testing. Fact Sheet for Patients: PinkCheek.be Fact Sheet for Healthcare Providers: GravelBags.it This test is not yet approved or cleared by the [place] FDA and  has been authorized for detection and/or diagnosis of SARS-CoV-2 by  FDA under an Emergency Use Authorization (EUA). This EUA will remain  in effect (meaning this test can be used) for the duration of the  Covid-19 declaration under Section 564(b)(1) of the Act, 21  U.S.C. section 360bbb-3(b)(1), unless the authorization is  terminated or revoked. Performed at [facility], [address], [place], [place] [address]    Today   Subjective    Jayley Hustead today has no new complaints No fever  Or chills   -O2 sats 99% on room air at rest and 97%  on room air with walking in place with walker in front of her  No Nausea, Vomiting or Diarrhea -Patient status has improved significantly      Patient has been seen and examined prior to discharge   Objective   Blood pressure (!) 153/65, pulse 64, temperature 98.1 F (36.7 C), temperature source Oral, resp. rate 16, height 5\' 4"  (1.626 m), weight 99.3 kg, SpO2 97 %.   Intake/Output Summary (Last 24 hours) at 07/31/2019 1405 Last data filed at 07/31/2019 0700 Gross per 24 hour  Intake 480 ml  Output 3000 ml  Net -2520 ml    Exam Gen:- Awake Alert, no acute distress, speaking in sentences HEENT:- [place].AT, No sclera icterus Neck-Supple Neck,No JVD,.  Lungs-much improved air movement, no wheezing CV- S1, S2 normal, regular Abd-  +ve B.Sounds, Abd Soft, No tenderness,    Extremity/Skin:-Resolved pitting edema,   good pulses Psych-affect is appropriate, oriented x3 Neuro-generalized weakness, old, subtle Rt hemiparesis, no new focal deficits, no tremors    Data Review   CBC w Diff:  Lab Results  Component Value Date   WBC 7.9 07/31/2019   HGB 10.2 (L) 07/31/2019   HCT 32.4 (L) 07/31/2019   PLT 290 07/31/2019   LYMPHOPCT 12 07/23/2019   MONOPCT 6 07/23/2019   EOSPCT 1 07/23/2019   BASOPCT 0 07/23/2019    CMP:  Lab Results  Component Value Date   NA 140 07/31/2019   K 3.6 07/31/2019   CL 101 07/31/2019   CO2 29 07/31/2019   BUN 37 (H) 07/31/2019   CREATININE 1.49 (H) 07/31/2019   PROT 6.9 07/27/2019   ALBUMIN 2.7 (L) 07/27/2019   BILITOT 0.5 07/27/2019   ALKPHOS 43 07/27/2019   AST 12 (L) 07/27/2019   ALT 21 07/27/2019  .   Total Discharge time is about 33 minutes  Roxan Hockey M.D on 07/31/2019 at 2:05 PM  Go to [email or web address] -  for contact info  [facility] - Office  [phone]

## 2019-08-01 ENCOUNTER — Other Ambulatory Visit: Payer: Self-pay

## 2019-08-01 DIAGNOSIS — J811 Chronic pulmonary edema: Secondary | ICD-10-CM | POA: Diagnosis not present

## 2019-08-01 DIAGNOSIS — K859 Acute pancreatitis without necrosis or infection, unspecified: Secondary | ICD-10-CM | POA: Diagnosis not present

## 2019-08-01 DIAGNOSIS — D649 Anemia, unspecified: Secondary | ICD-10-CM | POA: Diagnosis not present

## 2019-08-01 DIAGNOSIS — I4891 Unspecified atrial fibrillation: Secondary | ICD-10-CM | POA: Diagnosis not present

## 2019-08-01 DIAGNOSIS — J9621 Acute and chronic respiratory failure with hypoxia: Secondary | ICD-10-CM | POA: Diagnosis not present

## 2019-08-01 DIAGNOSIS — E119 Type 2 diabetes mellitus without complications: Secondary | ICD-10-CM | POA: Diagnosis not present

## 2019-08-01 NOTE — Patient Outreach (Signed)
[facility]) Care Management  08/01/2019  [name] 06/10/1934 [id number]   Referral Date: 08/01/19 Referral Source: [employer] Report Referral Reason: Patient discharged [facility] Na on 07/31/19  Patient discharged to [facility]. No outreach needed at this time.  Plan: RN CM will close case.    [name], RN, MSN [facility] Care Management Care Management Coordinator Direct Line [phone] Toll Free: [phone]  Fax: [phone]

## 2019-08-03 ENCOUNTER — Telehealth: Payer: Self-pay

## 2019-08-03 NOTE — Telephone Encounter (Signed)
Virtual Visit Pre-Appointment Phone Call  "(Name), I am calling you today to discuss your upcoming appointment. We are currently trying to limit exposure to the virus that causes COVID-19 by seeing patients at home rather than in the office."  1. "What is the BEST phone number to call the day of the visit?" - include this in appointment notes  2. "Do you have or have access to (through a family member/friend) a smartphone with video capability that we can use for your visit?" a. If yes - list this number in appt notes as "cell" (if different from BEST phone #) and list the appointment type as a VIDEO visit in appointment notes b. If no - list the appointment type as a PHONE visit in appointment notes  Confirm consent - "In the setting of the current Covid19 crisis, you are scheduled for a (phone or video) visit with your provider on (date) at (time).  Just as we do with many in-office visits, in order for you to participate in this visit, we must obtain consent.  If you'd like, I can send this to your mychart (if signed up) or email for you to review.  Otherwise, I can obtain your verbal consent now.  All virtual visits are billed to your insurance company just like a normal visit would be.  By agreeing to a virtual visit, we'd like you to understand that the technology does not allow for your provider to perform an examination, and thus may limit your provider's ability to fully assess your condition. If your provider identifies any concerns that need to be evaluated in person, we will make arrangements to do so.  Finally, though the technology is pretty good, we cannot assure that it will always work on either your or our end, and in the setting of a video visit, we may have to convert it to a phone-only visit.  In either situation, we cannot ensure that we have a secure connection.  Are you willing to proceed?" STAFF: Did the patient verbally acknowledge consent to telehealth visit? Document  YES/NO here:   3. Advise patient to be prepared - "Two hours prior to your appointment, go ahead and check your blood pressure, pulse, oxygen saturation, and your weight (if you have the equipment to check those) and write them all down. When your visit starts, your provider will ask you for this information. If you have an Apple Watch or Kardia device, please plan to have heart rate information ready on the day of your appointment. Please have a pen and paper handy nearby the day of the visit as well."  4. Give patient instructions for MyChart download to smartphone OR Doximity/Doxy.me as below if video visit (depending on what platform provider is using)  5. Inform patient they will receive a phone call 15 minutes prior to their appointment time (may be from unknown caller ID) so they should be prepared to answer    TELEPHONE CALL NOTE  KAMYRAH FEESER has been deemed a candidate for a follow-up tele-health visit to limit community exposure during the Covid-19 pandemic. I spoke with the patient via phone to ensure availability of phone/video source, confirm preferred email & phone number, and discuss instructions and expectations.  I reminded FANNIE GATHRIGHT to be prepared with any vital sign and/or heart rhythm information that could potentially be obtained via home monitoring, at the time of her visit. I reminded ALEXX GIAMBRA to expect a phone call prior to her  visit.  Dorothey Baseman 08/03/2019 5:06 PM   INSTRUCTIONS FOR DOWNLOADING THE MYCHART APP TO SMARTPHONE  - The patient must first make sure to have activated MyChart and know their login information - If Apple, go to [employer] and type in MyChart in the search bar and download the app. If Android, ask patient to go to [employer] and type in [place] in the search bar and download the app. The app is free but as with any other app downloads, their phone may require them to verify saved payment information or Apple/Android password.    - The patient will need to then log into the app with their MyChart username and password, and select [facility] as their healthcare provider to link the account. When it is time for your visit, go to the MyChart app, find appointments, and click Begin Video Visit. Be sure to Select Allow for your device to access the Microphone and Camera for your visit. You will then be connected, and your provider will be with you shortly.  **If they have any issues connecting, or need assistance please contact MyChart service desk [phone])**  **If using a computer, in order to ensure the best quality for their visit they will need to use either of the following Internet Browsers: [employer], or Google Chrome**  IF USING DOXIMITY or DOXY.ME - The patient will receive a link just prior to their visit by text.     FULL LENGTH CONSENT FOR TELE-HEALTH VISIT   I hereby voluntarily request, consent and authorize [facility] and its employed or contracted physicians, physician assistants, nurse practitioners or other licensed health care professionals (the Practitioner), to provide me with telemedicine health care services (the "Services") as deemed necessary by the treating Practitioner. I acknowledge and consent to receive the Services by the Practitioner via telemedicine. I understand that the telemedicine visit will involve communicating with the Practitioner through live audiovisual communication technology and the disclosure of certain medical information by electronic transmission. I acknowledge that I have been given the opportunity to request an in-person assessment or other available alternative prior to the telemedicine visit and am voluntarily participating in the telemedicine visit.  I understand that I have the right to withhold or withdraw my consent to the use of telemedicine in the course of my care at any time, without affecting my right to future care or treatment, and that  the Practitioner or I may terminate the telemedicine visit at any time. I understand that I have the right to inspect all information obtained and/or recorded in the course of the telemedicine visit and may receive copies of available information for a reasonable fee.  I understand that some of the potential risks of receiving the Services via telemedicine include:  Marland Kitchen Delay or interruption in medical evaluation due to technological equipment failure or disruption; . Information transmitted may not be sufficient (e.g. poor resolution of images) to allow for appropriate medical decision making by the Practitioner; and/or  . In rare instances, security protocols could fail, causing a breach of personal health information.  Furthermore, I acknowledge that it is my responsibility to provide information about my medical history, conditions and care that is complete and accurate to the best of my ability. I acknowledge that Practitioner's advice, recommendations, and/or decision may be based on factors not within their control, such as incomplete or inaccurate data provided by me or distortions of diagnostic images or specimens that may result from electronic transmissions. I understand  that the practice of medicine is not an exact science and that Practitioner makes no warranties or guarantees regarding treatment outcomes. I acknowledge that I will receive a copy of this consent concurrently upon execution via email to the email address I last provided but may also request a printed copy by calling the office of [facility].    I understand that my insurance will be billed for this visit.   I have read or had this consent read to me. . I understand the contents of this consent, which adequately explains the benefits and risks of the Services being provided via telemedicine.  . I have been provided ample opportunity to ask questions regarding this consent and the Services and have had my questions answered to  my satisfaction. . I give my informed consent for the services to be provided through the use of telemedicine in my medical care  By participating in this telemedicine visit I agree to the above.

## 2019-08-04 ENCOUNTER — Encounter: Payer: Self-pay | Admitting: Student

## 2019-08-04 ENCOUNTER — Telehealth (INDEPENDENT_AMBULATORY_CARE_PROVIDER_SITE_OTHER): Payer: Medicare HMO | Admitting: Student

## 2019-08-04 VITALS — BP 128/72 | HR 78 | Temp 97.8°F | Resp 18 | Ht 65.0 in | Wt 221.6 lb

## 2019-08-04 DIAGNOSIS — J811 Chronic pulmonary edema: Secondary | ICD-10-CM | POA: Diagnosis not present

## 2019-08-04 DIAGNOSIS — I447 Left bundle-branch block, unspecified: Secondary | ICD-10-CM

## 2019-08-04 DIAGNOSIS — I4891 Unspecified atrial fibrillation: Secondary | ICD-10-CM | POA: Diagnosis not present

## 2019-08-04 DIAGNOSIS — I2510 Atherosclerotic heart disease of native coronary artery without angina pectoris: Secondary | ICD-10-CM | POA: Diagnosis not present

## 2019-08-04 DIAGNOSIS — N183 Chronic kidney disease, stage 3 unspecified: Secondary | ICD-10-CM

## 2019-08-04 DIAGNOSIS — Z79899 Other long term (current) drug therapy: Secondary | ICD-10-CM

## 2019-08-04 DIAGNOSIS — K859 Acute pancreatitis without necrosis or infection, unspecified: Secondary | ICD-10-CM | POA: Diagnosis not present

## 2019-08-04 DIAGNOSIS — I10 Essential (primary) hypertension: Secondary | ICD-10-CM

## 2019-08-04 DIAGNOSIS — I480 Paroxysmal atrial fibrillation: Secondary | ICD-10-CM

## 2019-08-04 DIAGNOSIS — I5032 Chronic diastolic (congestive) heart failure: Secondary | ICD-10-CM

## 2019-08-04 NOTE — Patient Instructions (Signed)
Medication Instructions:  Your physician recommends that you continue on your current medications as directed. Please refer to the Current Medication list given to you today.  *If you need a refill on your cardiac medications before your next appointment, please call your pharmacy*   Lab Work: Your physician recommends that you return for lab work in: [facility])   If you have labs (blood work) drawn today and your tests are completely normal, you will receive your results only by: Marland Kitchen MyChart Message (if you have MyChart) OR . A paper copy in the mail If you have any lab test that is abnormal or we need to change your treatment, we will call you to review the results.   Testing/Procedures: NONE    Follow-Up: At [facility], you and your health needs are our priority.  As part of our continuing mission to provide you with exceptional heart care, we have created designated Provider Care Teams.  These Care Teams include your primary Cardiologist (physician) and Advanced Practice Providers (APPs -  Physician Assistants and Nurse Practitioners) who all work together to provide you with the care you need, when you need it.  We recommend signing up for the patient portal called "MyChart".  Sign up information is provided on this After Visit Summary.  MyChart is used to connect with patients for Virtual Visits (Telemedicine).  Patients are able to view lab/test results, encounter notes, upcoming appointments, etc.  Non-urgent messages can be sent to your provider as well.   To learn more about what you can do with MyChart, go to [email or web address].    Your next appointment:   2-3 month(s)  The format for your next appointment:   In Person  Provider:   Kate Sable, MD   Other Instructions Thank you for choosing [facility]!

## 2019-08-04 NOTE — Progress Notes (Signed)
Virtual Visit via Telephone Note   This visit type was conducted due to national recommendations for restrictions regarding the COVID-19 Pandemic (e.g. social distancing) in an effort to limit this patient's exposure and mitigate transmission in our community.  Due to her co-morbid illnesses, this patient is at least at moderate risk for complications without adequate follow up.  This format is felt to be most appropriate for this patient at this time.  The patient did not have access to video technology/had technical difficulties with video requiring transitioning to audio format only (telephone).  All issues noted in this document were discussed and addressed.  No physical exam could be performed with this format.  Please refer to the patient's chart for her  consent to telehealth for [facility].   The patient was identified using 2 identifiers.  Date:  08/04/2019   ID:  [name], DOB [birth year], MRN [id number]  Patient Location: [place] Provider Location: Home  PCP:  [name], MD  Cardiologist:  Kate Sable, MD  Electrophysiologist:  None   Evaluation Performed:  Follow-Up Visit  Chief Complaint: Hospital Follow-Up  History of Present Illness:    [name] is a 85 y.o. female with past medical history of chronic diastolic CHF, LBBB, HTN and Stage III CKD who presents to the office today for hospital follow-up.  She was last examined by Dr. Bronson Ing in 06/2019 and reported worsening dyspnea for the past 2 weeks which could occur at rest or with activity. She was continued on Torsemide 20 mg daily and an echocardiogram along with Lexiscan Myoview were recommended for further evaluation. Echocardiogram showed a preserved EF of 60 to 65% with moderate LVH but no regional wall motion abnormalities.  NST showed a medium defect of moderate severity felt to be most consistent with breast tissue attenuation but no reversible defects. The study was overall  low risk.  In the interim, she presented to [facility] ED on 07/23/2019 for evaluation of abdominal pain and vomiting. Was found to have acute pancreatitis and was started on IVF. Cardiology was consulted as she was found to be in new onset atrial fibrillation with RVR. Given her elevated CHA2DS2-VASc Score of 8, she was started on Eliquis 5mg  BID for anticoagulation. She did convert back to normal sinus rhythm during admission and short acting Cardizem was transitioned to Cardizem CD 120 mg daily. She developed worsening dyspnea after having received IVF and was diuresed. IV Lasix was transitioned to Torsemide 20 mg twice daily at the time of discharge. She did have an AKI during admission with peak creatinine at 2.14. This had improved to 1.49 on discharge. She was discharged to [facility].   In talking with the patient today, she reports overall doing well since hospital discharge. She has been working with PT on a daily basis and is walking up to 20 feet. She reports she was informed she might can go home in the next 1 to 2 weeks. She denies any orthopnea or dyspnea on exertion. Reports her lower extremity edema has improved over the past few weeks. She denies any chest pain or palpitations. Was overall asymptomatic with her arrhythmia during admission.  The patient does not have symptoms concerning for COVID-19 infection (fever, chills, cough, or new shortness of breath).    Past Medical History:  Diagnosis Date  . Arthritis   . Chronic diastolic heart failure ([place])   . CKD (chronic kidney disease) stage 3, GFR 30-59 ml/min   .  Essential hypertension   . GERD (gastroesophageal reflux disease)   . Hemorrhoids   . History of stroke   . Type 2 diabetes mellitus ([place])    Past Surgical History:  Procedure Laterality Date  . BACK SURGERY    . CATARACT EXTRACTION W/PHACO Left 02/28/2013   Procedure: CATARACT EXTRACTION PHACO AND INTRAOCULAR LENS PLACEMENT (IOL) CDE=15.60;  Surgeon: Elta Guadeloupe T.  Gershon Crane, MD;  Location: [facility] ORS;  Service: Ophthalmology;  Laterality: Left;  . CHOLECYSTECTOMY    . COLONOSCOPY  12/12/2003   RMR: Normal rectum.  Long capacious tortuous colon, colonic mucosa appeared normal  . COLONOSCOPY N/A 03/21/2014   Dr. [name]: internal and external hemorrhoids, torturous colon, colonic diverticulosis   . ESOPHAGOGASTRODUODENOSCOPY  12/28/2001   LFY:BOFBP sliding hiatal hernia/. Three small bulbar ulcers, two with stigmata of bleeding and these were coagulated using heater probe unit   . ESOPHAGOGASTRODUODENOSCOPY N/A 03/21/2014   Dr. [name]: cervical esophageal web s/p dilation, negative H.pylori   . EYE SURGERY    . HIP FRACTURE SURGERY  2008  . JOINT REPLACEMENT     Rt hip, ????? sounds like just for fracture  . MALONEY DILATION N/A 03/21/2014   Procedure: [name] DILATION;  Surgeon: [name], MD;  Location: [facility] ENDO SUITE;  Service: Endoscopy;  Laterality: N/A;  . SAVORY DILATION N/A 03/21/2014   Procedure: SAVORY DILATION;  Surgeon: [name], MD;  Location: [facility] ENDO SUITE;  Service: Endoscopy;  Laterality: N/A;     Current Meds  Medication Sig  . acetaminophen (TYLENOL) 325 MG tablet Take 2 tablets (650 mg total) by mouth every 6 (six) hours as needed for mild pain, fever or headache (or Fever >/= 101).  Marland Kitchen apixaban (ELIQUIS) 2.5 MG TABS tablet Take 1 tablet (2.5 mg total) by mouth 2 (two) times daily.  Marland Kitchen diltiazem (CARDIZEM CD) 120 MG 24 hr capsule Take 1 capsule (120 mg total) by mouth daily.  . fluticasone (FLONASE) 50 MCG/ACT nasal spray Place 1 spray into both nostrils daily.  Marland Kitchen gabapentin (NEURONTIN) 300 MG capsule Take 300 mg by mouth 2 (two) times daily.   . hydrALAZINE (APRESOLINE) 50 MG tablet Take 1.5 tablets (75 mg total) by mouth 2 (two) times daily.  . hydrocortisone (ANUSOL-HC) 2.5 % rectal cream Place 1 application rectally 2 (two) times daily.  . insulin aspart (NOVOLOG) 100 UNIT/ML FlexPen Inject 0-10 Units into the skin 3 (three) times  daily with meals. insulin aspart (novoLOG) injection 0-10 Units 0-10 Units Subcutaneous, 3 times daily with meals CBG < 70: Implement Hypoglycemia Standing Orders and refer to Hypoglycemia Standing Orders sidebar report  CBG 70 - 120: 0 unit CBG 121 - 150: 0 unit  CBG 151 - 200: 1 unit CBG 201 - 250: 2 units CBG 251 - 300: 4 units CBG 301 - 350: 6 units  CBG 351 - 400: 8 units  CBG > 400: 10 units  . insulin aspart (NOVOLOG) 100 UNIT/ML injection Inject 10 Units into the skin 3 (three) times daily before meals.  . insulin detemir (LEVEMIR) 100 UNIT/ML injection Inject 0.7 mLs (70 Units total) into the skin at bedtime.  . isosorbide mononitrate (IMDUR) 30 MG 24 hr tablet Take 1 tablet (30 mg total) by mouth daily.  . polyethylene glycol (MIRALAX / GLYCOLAX) 17 g packet Take 17 g by mouth daily.  . simvastatin (ZOCOR) 20 MG tablet Take 20 mg by mouth daily.  Marland Kitchen torsemide (DEMADEX) 20 MG tablet Take 1 tablet (20 mg total) by  mouth 2 (two) times daily.     Allergies:   Patient has no known allergies.   Social History   Tobacco Use  . Smoking status: Never Smoker  . Smokeless tobacco: Never Used  Substance Use Topics  . Alcohol use: No  . Drug use: No     Family Hx: The patient's family history includes Crohn's disease in her daughter; Diabetes in her brother, sister, and sister; Heart attack in her sister; Hypertension in her mother and sister. There is no history of Colon cancer.  ROS:   Please see the history of present illness.     All other systems reviewed and are negative.   Prior CV studies:   The following studies were reviewed today:  Echocardiogram: 06/2019 IMPRESSIONS    1. Left ventricular ejection fraction, by estimation, is 60 to 65%. The  left ventricle has normal function. The left ventrical has no regional  wall motion abnormalities. There is moderately increased left ventricular  hypertrophy. Left ventricular  diastolic parameters are consistent with Grade II  diastolic dysfunction  (pseudonormalization). Elevated left atrial pressure.  2. Right ventricular systolic function is normal. The right ventricular  size is normal.  3. Left atrial size was severely dilated.  4. The mitral valve is normal in structure and function. trivial mitral  valve regurgitation. No evidence of mitral stenosis.  5. The aortic valve is tricuspid. Aortic valve regurgitation is not  visualized. No aortic stenosis is present.  6. The inferior vena cava is normal in size with greater than 50%  respiratory variability, suggesting right atrial pressure of 3 mmHg.  NST: 06/2019  Nuclear stress EF: 55%.  The left ventricular ejection fraction is normal (55-65%).  There was no ST segment deviation noted during stress.  There is a medium defect of moderate severity present in the basal anterior, basal anteroseptal, mid anterior and mid anteroseptal location. The defect is non-reversible and consistent with breast attenuation artifact.  This is a low risk study.     Labs/Other Tests and Data Reviewed:    EKG:  An ECG dated 07/27/2019 was personally reviewed today and demonstrated:  NSR, HR 68 with known LBBB.   Recent Labs: 07/23/2019: B Natriuretic Peptide 104.0; Magnesium 2.2 07/24/2019: TSH 0.420 07/27/2019: ALT 21 07/31/2019: BUN 37; Creatinine, Ser 1.49; Hemoglobin 10.2; Platelets 290; Potassium 3.6; Sodium 140   Recent Lipid Panel Lab Results  Component Value Date/Time   CHOL 118 07/24/2019 04:19 AM   TRIG 35 07/24/2019 04:19 AM   HDL 38 (L) 07/24/2019 04:19 AM   CHOLHDL 3.1 07/24/2019 04:19 AM   LDLCALC 73 07/24/2019 04:19 AM    Wt Readings from Last 3 Encounters:  08/04/19 221 lb 9.6 oz (100.5 kg)  07/31/19 218 lb 14.7 oz (99.3 kg)  07/05/19 240 lb (108.9 kg)     Objective:    Vital Signs:  BP 128/72   Pulse 78   Temp 97.8 F (36.6 C)   Resp 18   Ht 5\' 5"  (1.651 m)   Wt 221 lb 9.6 oz (100.5 kg)   SpO2 97%   BMI 36.88 kg/m    General:  Pleasant female sounding in NAD Psych: Normal affect. Neuro: Alert and oriented X 3.  Lungs:  Resp regular and unlabored while talking on the phone.   ASSESSMENT & PLAN:    1. Chronic Diastolic CHF - She did require IV diuresis during admission after receiving IV fluids in the setting of pancreatitis. She reports her breathing has  now returned to baseline and she denies any lower extremity edema. Will continue Torsemide 20 mg twice daily. Her nurse was unable to tell me if she has had repeat labs since being at SNF, therefore will order a repeat BMET to assess renal function and electrolytes.   2. Paroxysmal Atrial Fibrillation - She developed atrial fibrillation during her recent admission for acute pancreatitis, UTI and acute hypoxic respiratory failure. She did convert back to normal sinus rhythm during admission and denies any palpitations since discharge to SNF. - Heart rate was well controlled in the 70's when checked earlier today. Continue Cardizem CD 120 mg daily for rate control. - She denies any evidence of active bleeding. She is currently on Eliquis 2.5 mg twice daily (dosing reduced prior to discharge due to creatinine - pending repeat labs, may need to titrate to 5mg  BID).  Suspect that her arrhythmia was triggered by her acute illness. If no recurrence over the next several months, could consider discontinuation of anticoagulation.  3. LBBB - Chronic. Recent stress test showed breast tissue attenuation but no reversible defects, overall being a low risk study.   4. HTN - BP is well controlled at 128/72.  Continue current regimen with Cardizem CD 120 mg daily, Hydralazine 75 mg twice daily and Imdur 30mg  daily.   5. Stage 3 CKD - creatinine peaked at 2.14 during recent admission, improved to 1.49 on 07/31/2019. Will recheck BMET given titration of Torsemide.    COVID-19 Education: The signs and symptoms of COVID-19 were discussed with the patient and how to seek care for  testing (follow up with PCP or arrange E-visit). The importance of social distancing was discussed today.  Time:   Today, I have spent 12 minutes with the patient with telehealth technology discussing the above problems.     Medication Adjustments/Labs and Tests Ordered: Current medicines are reviewed at length with the patient today.  Concerns regarding medicines are outlined above.   Tests Ordered: Orders Placed This Encounter  Procedures  . Basic Metabolic Panel (BMET)    Medication Changes: No orders of the defined types were placed in this encounter.   Follow Up:  In Person in 2-3 month(s)  Signed, Erma Heritage, PA-C  08/04/2019 5:22 PM    [facility]

## 2019-08-07 DIAGNOSIS — D649 Anemia, unspecified: Secondary | ICD-10-CM | POA: Diagnosis not present

## 2019-08-07 DIAGNOSIS — J9621 Acute and chronic respiratory failure with hypoxia: Secondary | ICD-10-CM | POA: Diagnosis not present

## 2019-08-07 DIAGNOSIS — I4819 Other persistent atrial fibrillation: Secondary | ICD-10-CM | POA: Diagnosis not present

## 2019-08-22 ENCOUNTER — Other Ambulatory Visit: Payer: Self-pay

## 2019-08-22 NOTE — Patient Outreach (Signed)
[facility]) Care Management  08/22/2019  [name] 1935-03-07 [id number]   Referral Date: 08/22/19 Referral Source: [employer] Report Referral Reason: Patient discharged from [facility] 05/18/13.     Telephone call to [facility] for status update.  CM was told patient is not there and that she deceased.  Plan: RN CM will close case.     [name], RN, MSN [facility] Care Management Care Management Coordinator Direct Line [phone] Toll Free: [phone]  Fax: [phone]

## 2019-08-25 DIAGNOSIS — M1991 Primary osteoarthritis, unspecified site: Secondary | ICD-10-CM | POA: Diagnosis not present

## 2019-08-25 DIAGNOSIS — I447 Left bundle-branch block, unspecified: Secondary | ICD-10-CM | POA: Diagnosis not present

## 2019-08-25 DIAGNOSIS — I2510 Atherosclerotic heart disease of native coronary artery without angina pectoris: Secondary | ICD-10-CM | POA: Diagnosis not present

## 2019-08-25 DIAGNOSIS — I480 Paroxysmal atrial fibrillation: Secondary | ICD-10-CM | POA: Diagnosis not present

## 2019-08-25 DIAGNOSIS — I130 Hypertensive heart and chronic kidney disease with heart failure and stage 1 through stage 4 chronic kidney disease, or unspecified chronic kidney disease: Secondary | ICD-10-CM | POA: Diagnosis not present

## 2019-08-25 DIAGNOSIS — I5030 Unspecified diastolic (congestive) heart failure: Secondary | ICD-10-CM | POA: Diagnosis not present

## 2019-08-25 DIAGNOSIS — N183 Chronic kidney disease, stage 3 unspecified: Secondary | ICD-10-CM | POA: Diagnosis not present

## 2019-08-25 DIAGNOSIS — K859 Acute pancreatitis without necrosis or infection, unspecified: Secondary | ICD-10-CM | POA: Diagnosis not present

## 2019-08-25 DIAGNOSIS — E1122 Type 2 diabetes mellitus with diabetic chronic kidney disease: Secondary | ICD-10-CM | POA: Diagnosis not present

## 2019-08-28 DIAGNOSIS — E1122 Type 2 diabetes mellitus with diabetic chronic kidney disease: Secondary | ICD-10-CM | POA: Diagnosis not present

## 2019-08-28 DIAGNOSIS — K859 Acute pancreatitis without necrosis or infection, unspecified: Secondary | ICD-10-CM | POA: Diagnosis not present

## 2019-08-28 DIAGNOSIS — I130 Hypertensive heart and chronic kidney disease with heart failure and stage 1 through stage 4 chronic kidney disease, or unspecified chronic kidney disease: Secondary | ICD-10-CM | POA: Diagnosis not present

## 2019-08-28 DIAGNOSIS — M6281 Muscle weakness (generalized): Secondary | ICD-10-CM | POA: Diagnosis not present

## 2019-08-28 DIAGNOSIS — N183 Chronic kidney disease, stage 3 unspecified: Secondary | ICD-10-CM | POA: Diagnosis not present

## 2019-08-28 DIAGNOSIS — I5032 Chronic diastolic (congestive) heart failure: Secondary | ICD-10-CM | POA: Diagnosis not present

## 2019-08-28 DIAGNOSIS — J9601 Acute respiratory failure with hypoxia: Secondary | ICD-10-CM | POA: Diagnosis not present

## 2019-08-28 DIAGNOSIS — I2510 Atherosclerotic heart disease of native coronary artery without angina pectoris: Secondary | ICD-10-CM | POA: Diagnosis not present

## 2019-08-28 DIAGNOSIS — I5030 Unspecified diastolic (congestive) heart failure: Secondary | ICD-10-CM | POA: Diagnosis not present

## 2019-08-28 DIAGNOSIS — I4891 Unspecified atrial fibrillation: Secondary | ICD-10-CM | POA: Diagnosis not present

## 2019-08-28 DIAGNOSIS — I447 Left bundle-branch block, unspecified: Secondary | ICD-10-CM | POA: Diagnosis not present

## 2019-08-28 DIAGNOSIS — I480 Paroxysmal atrial fibrillation: Secondary | ICD-10-CM | POA: Diagnosis not present

## 2019-08-28 DIAGNOSIS — M1991 Primary osteoarthritis, unspecified site: Secondary | ICD-10-CM | POA: Diagnosis not present

## 2019-08-29 DIAGNOSIS — I480 Paroxysmal atrial fibrillation: Secondary | ICD-10-CM | POA: Diagnosis not present

## 2019-08-29 DIAGNOSIS — E1122 Type 2 diabetes mellitus with diabetic chronic kidney disease: Secondary | ICD-10-CM | POA: Diagnosis not present

## 2019-08-29 DIAGNOSIS — K859 Acute pancreatitis without necrosis or infection, unspecified: Secondary | ICD-10-CM | POA: Diagnosis not present

## 2019-08-29 DIAGNOSIS — N183 Chronic kidney disease, stage 3 unspecified: Secondary | ICD-10-CM | POA: Diagnosis not present

## 2019-08-29 DIAGNOSIS — I2510 Atherosclerotic heart disease of native coronary artery without angina pectoris: Secondary | ICD-10-CM | POA: Diagnosis not present

## 2019-08-29 DIAGNOSIS — M1991 Primary osteoarthritis, unspecified site: Secondary | ICD-10-CM | POA: Diagnosis not present

## 2019-08-29 DIAGNOSIS — I5030 Unspecified diastolic (congestive) heart failure: Secondary | ICD-10-CM | POA: Diagnosis not present

## 2019-08-29 DIAGNOSIS — I130 Hypertensive heart and chronic kidney disease with heart failure and stage 1 through stage 4 chronic kidney disease, or unspecified chronic kidney disease: Secondary | ICD-10-CM | POA: Diagnosis not present

## 2019-08-29 DIAGNOSIS — I447 Left bundle-branch block, unspecified: Secondary | ICD-10-CM | POA: Diagnosis not present

## 2019-08-30 DIAGNOSIS — K859 Acute pancreatitis without necrosis or infection, unspecified: Secondary | ICD-10-CM | POA: Diagnosis not present

## 2019-08-30 DIAGNOSIS — M1991 Primary osteoarthritis, unspecified site: Secondary | ICD-10-CM | POA: Diagnosis not present

## 2019-08-30 DIAGNOSIS — I2510 Atherosclerotic heart disease of native coronary artery without angina pectoris: Secondary | ICD-10-CM | POA: Diagnosis not present

## 2019-08-30 DIAGNOSIS — I480 Paroxysmal atrial fibrillation: Secondary | ICD-10-CM | POA: Diagnosis not present

## 2019-08-30 DIAGNOSIS — N183 Chronic kidney disease, stage 3 unspecified: Secondary | ICD-10-CM | POA: Diagnosis not present

## 2019-08-30 DIAGNOSIS — I447 Left bundle-branch block, unspecified: Secondary | ICD-10-CM | POA: Diagnosis not present

## 2019-08-30 DIAGNOSIS — E1122 Type 2 diabetes mellitus with diabetic chronic kidney disease: Secondary | ICD-10-CM | POA: Diagnosis not present

## 2019-08-30 DIAGNOSIS — I5030 Unspecified diastolic (congestive) heart failure: Secondary | ICD-10-CM | POA: Diagnosis not present

## 2019-08-30 DIAGNOSIS — I130 Hypertensive heart and chronic kidney disease with heart failure and stage 1 through stage 4 chronic kidney disease, or unspecified chronic kidney disease: Secondary | ICD-10-CM | POA: Diagnosis not present

## 2019-09-01 DIAGNOSIS — K219 Gastro-esophageal reflux disease without esophagitis: Secondary | ICD-10-CM | POA: Diagnosis not present

## 2019-09-01 DIAGNOSIS — G8190 Hemiplegia, unspecified affecting unspecified side: Secondary | ICD-10-CM | POA: Diagnosis not present

## 2019-09-01 DIAGNOSIS — E1142 Type 2 diabetes mellitus with diabetic polyneuropathy: Secondary | ICD-10-CM | POA: Diagnosis not present

## 2019-09-01 DIAGNOSIS — I10 Essential (primary) hypertension: Secondary | ICD-10-CM | POA: Diagnosis not present

## 2019-09-04 DIAGNOSIS — I130 Hypertensive heart and chronic kidney disease with heart failure and stage 1 through stage 4 chronic kidney disease, or unspecified chronic kidney disease: Secondary | ICD-10-CM | POA: Diagnosis not present

## 2019-09-04 DIAGNOSIS — I480 Paroxysmal atrial fibrillation: Secondary | ICD-10-CM | POA: Diagnosis not present

## 2019-09-04 DIAGNOSIS — N183 Chronic kidney disease, stage 3 unspecified: Secondary | ICD-10-CM | POA: Diagnosis not present

## 2019-09-04 DIAGNOSIS — K859 Acute pancreatitis without necrosis or infection, unspecified: Secondary | ICD-10-CM | POA: Diagnosis not present

## 2019-09-04 DIAGNOSIS — E1122 Type 2 diabetes mellitus with diabetic chronic kidney disease: Secondary | ICD-10-CM | POA: Diagnosis not present

## 2019-09-04 DIAGNOSIS — M1991 Primary osteoarthritis, unspecified site: Secondary | ICD-10-CM | POA: Diagnosis not present

## 2019-09-04 DIAGNOSIS — I2510 Atherosclerotic heart disease of native coronary artery without angina pectoris: Secondary | ICD-10-CM | POA: Diagnosis not present

## 2019-09-04 DIAGNOSIS — I447 Left bundle-branch block, unspecified: Secondary | ICD-10-CM | POA: Diagnosis not present

## 2019-09-04 DIAGNOSIS — I5030 Unspecified diastolic (congestive) heart failure: Secondary | ICD-10-CM | POA: Diagnosis not present

## 2019-09-05 DIAGNOSIS — N183 Chronic kidney disease, stage 3 unspecified: Secondary | ICD-10-CM | POA: Diagnosis not present

## 2019-09-05 DIAGNOSIS — I5030 Unspecified diastolic (congestive) heart failure: Secondary | ICD-10-CM | POA: Diagnosis not present

## 2019-09-05 DIAGNOSIS — I2510 Atherosclerotic heart disease of native coronary artery without angina pectoris: Secondary | ICD-10-CM | POA: Diagnosis not present

## 2019-09-05 DIAGNOSIS — M1991 Primary osteoarthritis, unspecified site: Secondary | ICD-10-CM | POA: Diagnosis not present

## 2019-09-05 DIAGNOSIS — K859 Acute pancreatitis without necrosis or infection, unspecified: Secondary | ICD-10-CM | POA: Diagnosis not present

## 2019-09-05 DIAGNOSIS — I130 Hypertensive heart and chronic kidney disease with heart failure and stage 1 through stage 4 chronic kidney disease, or unspecified chronic kidney disease: Secondary | ICD-10-CM | POA: Diagnosis not present

## 2019-09-05 DIAGNOSIS — E1122 Type 2 diabetes mellitus with diabetic chronic kidney disease: Secondary | ICD-10-CM | POA: Diagnosis not present

## 2019-09-05 DIAGNOSIS — I480 Paroxysmal atrial fibrillation: Secondary | ICD-10-CM | POA: Diagnosis not present

## 2019-09-05 DIAGNOSIS — I447 Left bundle-branch block, unspecified: Secondary | ICD-10-CM | POA: Diagnosis not present

## 2019-09-07 DIAGNOSIS — I2510 Atherosclerotic heart disease of native coronary artery without angina pectoris: Secondary | ICD-10-CM | POA: Diagnosis not present

## 2019-09-07 DIAGNOSIS — K859 Acute pancreatitis without necrosis or infection, unspecified: Secondary | ICD-10-CM | POA: Diagnosis not present

## 2019-09-07 DIAGNOSIS — M1991 Primary osteoarthritis, unspecified site: Secondary | ICD-10-CM | POA: Diagnosis not present

## 2019-09-07 DIAGNOSIS — I480 Paroxysmal atrial fibrillation: Secondary | ICD-10-CM | POA: Diagnosis not present

## 2019-09-07 DIAGNOSIS — I447 Left bundle-branch block, unspecified: Secondary | ICD-10-CM | POA: Diagnosis not present

## 2019-09-07 DIAGNOSIS — N183 Chronic kidney disease, stage 3 unspecified: Secondary | ICD-10-CM | POA: Diagnosis not present

## 2019-09-07 DIAGNOSIS — I130 Hypertensive heart and chronic kidney disease with heart failure and stage 1 through stage 4 chronic kidney disease, or unspecified chronic kidney disease: Secondary | ICD-10-CM | POA: Diagnosis not present

## 2019-09-07 DIAGNOSIS — I5030 Unspecified diastolic (congestive) heart failure: Secondary | ICD-10-CM | POA: Diagnosis not present

## 2019-09-07 DIAGNOSIS — E1122 Type 2 diabetes mellitus with diabetic chronic kidney disease: Secondary | ICD-10-CM | POA: Diagnosis not present

## 2019-09-08 DIAGNOSIS — M1991 Primary osteoarthritis, unspecified site: Secondary | ICD-10-CM | POA: Diagnosis not present

## 2019-09-08 DIAGNOSIS — I447 Left bundle-branch block, unspecified: Secondary | ICD-10-CM | POA: Diagnosis not present

## 2019-09-08 DIAGNOSIS — E1122 Type 2 diabetes mellitus with diabetic chronic kidney disease: Secondary | ICD-10-CM | POA: Diagnosis not present

## 2019-09-08 DIAGNOSIS — I2510 Atherosclerotic heart disease of native coronary artery without angina pectoris: Secondary | ICD-10-CM | POA: Diagnosis not present

## 2019-09-08 DIAGNOSIS — K859 Acute pancreatitis without necrosis or infection, unspecified: Secondary | ICD-10-CM | POA: Diagnosis not present

## 2019-09-08 DIAGNOSIS — I130 Hypertensive heart and chronic kidney disease with heart failure and stage 1 through stage 4 chronic kidney disease, or unspecified chronic kidney disease: Secondary | ICD-10-CM | POA: Diagnosis not present

## 2019-09-08 DIAGNOSIS — I480 Paroxysmal atrial fibrillation: Secondary | ICD-10-CM | POA: Diagnosis not present

## 2019-09-08 DIAGNOSIS — N183 Chronic kidney disease, stage 3 unspecified: Secondary | ICD-10-CM | POA: Diagnosis not present

## 2019-09-08 DIAGNOSIS — I5030 Unspecified diastolic (congestive) heart failure: Secondary | ICD-10-CM | POA: Diagnosis not present

## 2019-09-11 DIAGNOSIS — I480 Paroxysmal atrial fibrillation: Secondary | ICD-10-CM | POA: Diagnosis not present

## 2019-09-11 DIAGNOSIS — K859 Acute pancreatitis without necrosis or infection, unspecified: Secondary | ICD-10-CM | POA: Diagnosis not present

## 2019-09-11 DIAGNOSIS — I2510 Atherosclerotic heart disease of native coronary artery without angina pectoris: Secondary | ICD-10-CM | POA: Diagnosis not present

## 2019-09-11 DIAGNOSIS — E1122 Type 2 diabetes mellitus with diabetic chronic kidney disease: Secondary | ICD-10-CM | POA: Diagnosis not present

## 2019-09-11 DIAGNOSIS — I5030 Unspecified diastolic (congestive) heart failure: Secondary | ICD-10-CM | POA: Diagnosis not present

## 2019-09-11 DIAGNOSIS — I447 Left bundle-branch block, unspecified: Secondary | ICD-10-CM | POA: Diagnosis not present

## 2019-09-11 DIAGNOSIS — M1991 Primary osteoarthritis, unspecified site: Secondary | ICD-10-CM | POA: Diagnosis not present

## 2019-09-11 DIAGNOSIS — N183 Chronic kidney disease, stage 3 unspecified: Secondary | ICD-10-CM | POA: Diagnosis not present

## 2019-09-11 DIAGNOSIS — I130 Hypertensive heart and chronic kidney disease with heart failure and stage 1 through stage 4 chronic kidney disease, or unspecified chronic kidney disease: Secondary | ICD-10-CM | POA: Diagnosis not present

## 2019-09-12 DIAGNOSIS — K859 Acute pancreatitis without necrosis or infection, unspecified: Secondary | ICD-10-CM | POA: Diagnosis not present

## 2019-09-12 DIAGNOSIS — I480 Paroxysmal atrial fibrillation: Secondary | ICD-10-CM | POA: Diagnosis not present

## 2019-09-12 DIAGNOSIS — N183 Chronic kidney disease, stage 3 unspecified: Secondary | ICD-10-CM | POA: Diagnosis not present

## 2019-09-12 DIAGNOSIS — E1122 Type 2 diabetes mellitus with diabetic chronic kidney disease: Secondary | ICD-10-CM | POA: Diagnosis not present

## 2019-09-12 DIAGNOSIS — M1991 Primary osteoarthritis, unspecified site: Secondary | ICD-10-CM | POA: Diagnosis not present

## 2019-09-12 DIAGNOSIS — I447 Left bundle-branch block, unspecified: Secondary | ICD-10-CM | POA: Diagnosis not present

## 2019-09-12 DIAGNOSIS — I5030 Unspecified diastolic (congestive) heart failure: Secondary | ICD-10-CM | POA: Diagnosis not present

## 2019-09-12 DIAGNOSIS — I2510 Atherosclerotic heart disease of native coronary artery without angina pectoris: Secondary | ICD-10-CM | POA: Diagnosis not present

## 2019-09-12 DIAGNOSIS — I130 Hypertensive heart and chronic kidney disease with heart failure and stage 1 through stage 4 chronic kidney disease, or unspecified chronic kidney disease: Secondary | ICD-10-CM | POA: Diagnosis not present

## 2019-09-13 DIAGNOSIS — I130 Hypertensive heart and chronic kidney disease with heart failure and stage 1 through stage 4 chronic kidney disease, or unspecified chronic kidney disease: Secondary | ICD-10-CM | POA: Diagnosis not present

## 2019-09-13 DIAGNOSIS — M1991 Primary osteoarthritis, unspecified site: Secondary | ICD-10-CM | POA: Diagnosis not present

## 2019-09-13 DIAGNOSIS — K859 Acute pancreatitis without necrosis or infection, unspecified: Secondary | ICD-10-CM | POA: Diagnosis not present

## 2019-09-13 DIAGNOSIS — I480 Paroxysmal atrial fibrillation: Secondary | ICD-10-CM | POA: Diagnosis not present

## 2019-09-13 DIAGNOSIS — E1122 Type 2 diabetes mellitus with diabetic chronic kidney disease: Secondary | ICD-10-CM | POA: Diagnosis not present

## 2019-09-13 DIAGNOSIS — N183 Chronic kidney disease, stage 3 unspecified: Secondary | ICD-10-CM | POA: Diagnosis not present

## 2019-09-13 DIAGNOSIS — I5030 Unspecified diastolic (congestive) heart failure: Secondary | ICD-10-CM | POA: Diagnosis not present

## 2019-09-13 DIAGNOSIS — I2510 Atherosclerotic heart disease of native coronary artery without angina pectoris: Secondary | ICD-10-CM | POA: Diagnosis not present

## 2019-09-13 DIAGNOSIS — I447 Left bundle-branch block, unspecified: Secondary | ICD-10-CM | POA: Diagnosis not present

## 2019-09-15 DIAGNOSIS — N183 Chronic kidney disease, stage 3 unspecified: Secondary | ICD-10-CM | POA: Diagnosis not present

## 2019-09-15 DIAGNOSIS — I130 Hypertensive heart and chronic kidney disease with heart failure and stage 1 through stage 4 chronic kidney disease, or unspecified chronic kidney disease: Secondary | ICD-10-CM | POA: Diagnosis not present

## 2019-09-15 DIAGNOSIS — I480 Paroxysmal atrial fibrillation: Secondary | ICD-10-CM | POA: Diagnosis not present

## 2019-09-15 DIAGNOSIS — I5030 Unspecified diastolic (congestive) heart failure: Secondary | ICD-10-CM | POA: Diagnosis not present

## 2019-09-15 DIAGNOSIS — M1991 Primary osteoarthritis, unspecified site: Secondary | ICD-10-CM | POA: Diagnosis not present

## 2019-09-15 DIAGNOSIS — I2510 Atherosclerotic heart disease of native coronary artery without angina pectoris: Secondary | ICD-10-CM | POA: Diagnosis not present

## 2019-09-15 DIAGNOSIS — E1122 Type 2 diabetes mellitus with diabetic chronic kidney disease: Secondary | ICD-10-CM | POA: Diagnosis not present

## 2019-09-15 DIAGNOSIS — K859 Acute pancreatitis without necrosis or infection, unspecified: Secondary | ICD-10-CM | POA: Diagnosis not present

## 2019-09-15 DIAGNOSIS — I447 Left bundle-branch block, unspecified: Secondary | ICD-10-CM | POA: Diagnosis not present

## 2019-09-17 DIAGNOSIS — N1832 Chronic kidney disease, stage 3b: Secondary | ICD-10-CM | POA: Diagnosis not present

## 2019-09-17 DIAGNOSIS — I4891 Unspecified atrial fibrillation: Secondary | ICD-10-CM | POA: Diagnosis not present

## 2019-09-17 DIAGNOSIS — J810 Acute pulmonary edema: Secondary | ICD-10-CM | POA: Diagnosis not present

## 2019-09-17 DIAGNOSIS — K859 Acute pancreatitis without necrosis or infection, unspecified: Secondary | ICD-10-CM | POA: Diagnosis not present

## 2019-09-17 DIAGNOSIS — I5032 Chronic diastolic (congestive) heart failure: Secondary | ICD-10-CM | POA: Diagnosis not present

## 2019-09-17 DIAGNOSIS — M6281 Muscle weakness (generalized): Secondary | ICD-10-CM | POA: Diagnosis not present

## 2019-09-17 DIAGNOSIS — J9601 Acute respiratory failure with hypoxia: Secondary | ICD-10-CM | POA: Diagnosis not present

## 2019-09-18 DIAGNOSIS — E1122 Type 2 diabetes mellitus with diabetic chronic kidney disease: Secondary | ICD-10-CM | POA: Diagnosis not present

## 2019-09-18 DIAGNOSIS — I130 Hypertensive heart and chronic kidney disease with heart failure and stage 1 through stage 4 chronic kidney disease, or unspecified chronic kidney disease: Secondary | ICD-10-CM | POA: Diagnosis not present

## 2019-09-18 DIAGNOSIS — K859 Acute pancreatitis without necrosis or infection, unspecified: Secondary | ICD-10-CM | POA: Diagnosis not present

## 2019-09-18 DIAGNOSIS — I480 Paroxysmal atrial fibrillation: Secondary | ICD-10-CM | POA: Diagnosis not present

## 2019-09-18 DIAGNOSIS — I2510 Atherosclerotic heart disease of native coronary artery without angina pectoris: Secondary | ICD-10-CM | POA: Diagnosis not present

## 2019-09-18 DIAGNOSIS — M1991 Primary osteoarthritis, unspecified site: Secondary | ICD-10-CM | POA: Diagnosis not present

## 2019-09-18 DIAGNOSIS — I447 Left bundle-branch block, unspecified: Secondary | ICD-10-CM | POA: Diagnosis not present

## 2019-09-18 DIAGNOSIS — N183 Chronic kidney disease, stage 3 unspecified: Secondary | ICD-10-CM | POA: Diagnosis not present

## 2019-09-18 DIAGNOSIS — I5030 Unspecified diastolic (congestive) heart failure: Secondary | ICD-10-CM | POA: Diagnosis not present

## 2019-09-19 DIAGNOSIS — I447 Left bundle-branch block, unspecified: Secondary | ICD-10-CM | POA: Diagnosis not present

## 2019-09-19 DIAGNOSIS — I5030 Unspecified diastolic (congestive) heart failure: Secondary | ICD-10-CM | POA: Diagnosis not present

## 2019-09-19 DIAGNOSIS — M1991 Primary osteoarthritis, unspecified site: Secondary | ICD-10-CM | POA: Diagnosis not present

## 2019-09-19 DIAGNOSIS — N183 Chronic kidney disease, stage 3 unspecified: Secondary | ICD-10-CM | POA: Diagnosis not present

## 2019-09-19 DIAGNOSIS — I480 Paroxysmal atrial fibrillation: Secondary | ICD-10-CM | POA: Diagnosis not present

## 2019-09-19 DIAGNOSIS — K859 Acute pancreatitis without necrosis or infection, unspecified: Secondary | ICD-10-CM | POA: Diagnosis not present

## 2019-09-19 DIAGNOSIS — E1122 Type 2 diabetes mellitus with diabetic chronic kidney disease: Secondary | ICD-10-CM | POA: Diagnosis not present

## 2019-09-19 DIAGNOSIS — I130 Hypertensive heart and chronic kidney disease with heart failure and stage 1 through stage 4 chronic kidney disease, or unspecified chronic kidney disease: Secondary | ICD-10-CM | POA: Diagnosis not present

## 2019-09-19 DIAGNOSIS — I2510 Atherosclerotic heart disease of native coronary artery without angina pectoris: Secondary | ICD-10-CM | POA: Diagnosis not present

## 2019-09-20 DIAGNOSIS — I130 Hypertensive heart and chronic kidney disease with heart failure and stage 1 through stage 4 chronic kidney disease, or unspecified chronic kidney disease: Secondary | ICD-10-CM | POA: Diagnosis not present

## 2019-09-20 DIAGNOSIS — I2510 Atherosclerotic heart disease of native coronary artery without angina pectoris: Secondary | ICD-10-CM | POA: Diagnosis not present

## 2019-09-20 DIAGNOSIS — E1122 Type 2 diabetes mellitus with diabetic chronic kidney disease: Secondary | ICD-10-CM | POA: Diagnosis not present

## 2019-09-20 DIAGNOSIS — N183 Chronic kidney disease, stage 3 unspecified: Secondary | ICD-10-CM | POA: Diagnosis not present

## 2019-09-20 DIAGNOSIS — I5030 Unspecified diastolic (congestive) heart failure: Secondary | ICD-10-CM | POA: Diagnosis not present

## 2019-09-20 DIAGNOSIS — I480 Paroxysmal atrial fibrillation: Secondary | ICD-10-CM | POA: Diagnosis not present

## 2019-09-20 DIAGNOSIS — M1991 Primary osteoarthritis, unspecified site: Secondary | ICD-10-CM | POA: Diagnosis not present

## 2019-09-20 DIAGNOSIS — K859 Acute pancreatitis without necrosis or infection, unspecified: Secondary | ICD-10-CM | POA: Diagnosis not present

## 2019-09-20 DIAGNOSIS — I447 Left bundle-branch block, unspecified: Secondary | ICD-10-CM | POA: Diagnosis not present

## 2019-09-22 DIAGNOSIS — I5030 Unspecified diastolic (congestive) heart failure: Secondary | ICD-10-CM | POA: Diagnosis not present

## 2019-09-22 DIAGNOSIS — E1122 Type 2 diabetes mellitus with diabetic chronic kidney disease: Secondary | ICD-10-CM | POA: Diagnosis not present

## 2019-09-22 DIAGNOSIS — I447 Left bundle-branch block, unspecified: Secondary | ICD-10-CM | POA: Diagnosis not present

## 2019-09-22 DIAGNOSIS — M1991 Primary osteoarthritis, unspecified site: Secondary | ICD-10-CM | POA: Diagnosis not present

## 2019-09-22 DIAGNOSIS — K859 Acute pancreatitis without necrosis or infection, unspecified: Secondary | ICD-10-CM | POA: Diagnosis not present

## 2019-09-22 DIAGNOSIS — N183 Chronic kidney disease, stage 3 unspecified: Secondary | ICD-10-CM | POA: Diagnosis not present

## 2019-09-22 DIAGNOSIS — I130 Hypertensive heart and chronic kidney disease with heart failure and stage 1 through stage 4 chronic kidney disease, or unspecified chronic kidney disease: Secondary | ICD-10-CM | POA: Diagnosis not present

## 2019-09-22 DIAGNOSIS — I2510 Atherosclerotic heart disease of native coronary artery without angina pectoris: Secondary | ICD-10-CM | POA: Diagnosis not present

## 2019-09-22 DIAGNOSIS — I480 Paroxysmal atrial fibrillation: Secondary | ICD-10-CM | POA: Diagnosis not present

## 2019-09-24 DIAGNOSIS — M1991 Primary osteoarthritis, unspecified site: Secondary | ICD-10-CM | POA: Diagnosis not present

## 2019-09-24 DIAGNOSIS — K859 Acute pancreatitis without necrosis or infection, unspecified: Secondary | ICD-10-CM | POA: Diagnosis not present

## 2019-09-24 DIAGNOSIS — I130 Hypertensive heart and chronic kidney disease with heart failure and stage 1 through stage 4 chronic kidney disease, or unspecified chronic kidney disease: Secondary | ICD-10-CM | POA: Diagnosis not present

## 2019-09-24 DIAGNOSIS — I2510 Atherosclerotic heart disease of native coronary artery without angina pectoris: Secondary | ICD-10-CM | POA: Diagnosis not present

## 2019-09-24 DIAGNOSIS — N183 Chronic kidney disease, stage 3 unspecified: Secondary | ICD-10-CM | POA: Diagnosis not present

## 2019-09-24 DIAGNOSIS — I5030 Unspecified diastolic (congestive) heart failure: Secondary | ICD-10-CM | POA: Diagnosis not present

## 2019-09-24 DIAGNOSIS — E1122 Type 2 diabetes mellitus with diabetic chronic kidney disease: Secondary | ICD-10-CM | POA: Diagnosis not present

## 2019-09-24 DIAGNOSIS — I447 Left bundle-branch block, unspecified: Secondary | ICD-10-CM | POA: Diagnosis not present

## 2019-09-24 DIAGNOSIS — I480 Paroxysmal atrial fibrillation: Secondary | ICD-10-CM | POA: Diagnosis not present

## 2019-09-25 DIAGNOSIS — I480 Paroxysmal atrial fibrillation: Secondary | ICD-10-CM | POA: Diagnosis not present

## 2019-09-25 DIAGNOSIS — E1122 Type 2 diabetes mellitus with diabetic chronic kidney disease: Secondary | ICD-10-CM | POA: Diagnosis not present

## 2019-09-25 DIAGNOSIS — N183 Chronic kidney disease, stage 3 unspecified: Secondary | ICD-10-CM | POA: Diagnosis not present

## 2019-09-25 DIAGNOSIS — I447 Left bundle-branch block, unspecified: Secondary | ICD-10-CM | POA: Diagnosis not present

## 2019-09-25 DIAGNOSIS — I5030 Unspecified diastolic (congestive) heart failure: Secondary | ICD-10-CM | POA: Diagnosis not present

## 2019-09-25 DIAGNOSIS — M1991 Primary osteoarthritis, unspecified site: Secondary | ICD-10-CM | POA: Diagnosis not present

## 2019-09-25 DIAGNOSIS — K859 Acute pancreatitis without necrosis or infection, unspecified: Secondary | ICD-10-CM | POA: Diagnosis not present

## 2019-09-25 DIAGNOSIS — I130 Hypertensive heart and chronic kidney disease with heart failure and stage 1 through stage 4 chronic kidney disease, or unspecified chronic kidney disease: Secondary | ICD-10-CM | POA: Diagnosis not present

## 2019-09-25 DIAGNOSIS — I2510 Atherosclerotic heart disease of native coronary artery without angina pectoris: Secondary | ICD-10-CM | POA: Diagnosis not present

## 2019-09-30 DIAGNOSIS — I130 Hypertensive heart and chronic kidney disease with heart failure and stage 1 through stage 4 chronic kidney disease, or unspecified chronic kidney disease: Secondary | ICD-10-CM | POA: Diagnosis not present

## 2019-09-30 DIAGNOSIS — I480 Paroxysmal atrial fibrillation: Secondary | ICD-10-CM | POA: Diagnosis not present

## 2019-09-30 DIAGNOSIS — M1991 Primary osteoarthritis, unspecified site: Secondary | ICD-10-CM | POA: Diagnosis not present

## 2019-09-30 DIAGNOSIS — I5030 Unspecified diastolic (congestive) heart failure: Secondary | ICD-10-CM | POA: Diagnosis not present

## 2019-09-30 DIAGNOSIS — E1122 Type 2 diabetes mellitus with diabetic chronic kidney disease: Secondary | ICD-10-CM | POA: Diagnosis not present

## 2019-09-30 DIAGNOSIS — N183 Chronic kidney disease, stage 3 unspecified: Secondary | ICD-10-CM | POA: Diagnosis not present

## 2019-09-30 DIAGNOSIS — I2510 Atherosclerotic heart disease of native coronary artery without angina pectoris: Secondary | ICD-10-CM | POA: Diagnosis not present

## 2019-09-30 DIAGNOSIS — I447 Left bundle-branch block, unspecified: Secondary | ICD-10-CM | POA: Diagnosis not present

## 2019-09-30 DIAGNOSIS — K859 Acute pancreatitis without necrosis or infection, unspecified: Secondary | ICD-10-CM | POA: Diagnosis not present

## 2019-10-02 DIAGNOSIS — K859 Acute pancreatitis without necrosis or infection, unspecified: Secondary | ICD-10-CM | POA: Diagnosis not present

## 2019-10-02 DIAGNOSIS — I2510 Atherosclerotic heart disease of native coronary artery without angina pectoris: Secondary | ICD-10-CM | POA: Diagnosis not present

## 2019-10-02 DIAGNOSIS — M1991 Primary osteoarthritis, unspecified site: Secondary | ICD-10-CM | POA: Diagnosis not present

## 2019-10-02 DIAGNOSIS — I447 Left bundle-branch block, unspecified: Secondary | ICD-10-CM | POA: Diagnosis not present

## 2019-10-02 DIAGNOSIS — I480 Paroxysmal atrial fibrillation: Secondary | ICD-10-CM | POA: Diagnosis not present

## 2019-10-02 DIAGNOSIS — I5030 Unspecified diastolic (congestive) heart failure: Secondary | ICD-10-CM | POA: Diagnosis not present

## 2019-10-02 DIAGNOSIS — I130 Hypertensive heart and chronic kidney disease with heart failure and stage 1 through stage 4 chronic kidney disease, or unspecified chronic kidney disease: Secondary | ICD-10-CM | POA: Diagnosis not present

## 2019-10-02 DIAGNOSIS — N183 Chronic kidney disease, stage 3 unspecified: Secondary | ICD-10-CM | POA: Diagnosis not present

## 2019-10-02 DIAGNOSIS — E1122 Type 2 diabetes mellitus with diabetic chronic kidney disease: Secondary | ICD-10-CM | POA: Diagnosis not present

## 2019-10-03 DIAGNOSIS — I5032 Chronic diastolic (congestive) heart failure: Secondary | ICD-10-CM | POA: Diagnosis not present

## 2019-10-03 DIAGNOSIS — E1142 Type 2 diabetes mellitus with diabetic polyneuropathy: Secondary | ICD-10-CM | POA: Diagnosis not present

## 2019-10-03 DIAGNOSIS — N1832 Chronic kidney disease, stage 3b: Secondary | ICD-10-CM | POA: Diagnosis not present

## 2019-10-06 DIAGNOSIS — N183 Chronic kidney disease, stage 3 unspecified: Secondary | ICD-10-CM | POA: Diagnosis not present

## 2019-10-06 DIAGNOSIS — I480 Paroxysmal atrial fibrillation: Secondary | ICD-10-CM | POA: Diagnosis not present

## 2019-10-06 DIAGNOSIS — M1991 Primary osteoarthritis, unspecified site: Secondary | ICD-10-CM | POA: Diagnosis not present

## 2019-10-06 DIAGNOSIS — I5030 Unspecified diastolic (congestive) heart failure: Secondary | ICD-10-CM | POA: Diagnosis not present

## 2019-10-06 DIAGNOSIS — K859 Acute pancreatitis without necrosis or infection, unspecified: Secondary | ICD-10-CM | POA: Diagnosis not present

## 2019-10-06 DIAGNOSIS — E1122 Type 2 diabetes mellitus with diabetic chronic kidney disease: Secondary | ICD-10-CM | POA: Diagnosis not present

## 2019-10-06 DIAGNOSIS — I130 Hypertensive heart and chronic kidney disease with heart failure and stage 1 through stage 4 chronic kidney disease, or unspecified chronic kidney disease: Secondary | ICD-10-CM | POA: Diagnosis not present

## 2019-10-06 DIAGNOSIS — I2510 Atherosclerotic heart disease of native coronary artery without angina pectoris: Secondary | ICD-10-CM | POA: Diagnosis not present

## 2019-10-06 DIAGNOSIS — I447 Left bundle-branch block, unspecified: Secondary | ICD-10-CM | POA: Diagnosis not present

## 2019-10-09 ENCOUNTER — Other Ambulatory Visit: Payer: Self-pay

## 2019-10-09 ENCOUNTER — Telehealth (INDEPENDENT_AMBULATORY_CARE_PROVIDER_SITE_OTHER): Payer: Medicare HMO | Admitting: Cardiovascular Disease

## 2019-10-09 ENCOUNTER — Encounter: Payer: Self-pay | Admitting: Cardiovascular Disease

## 2019-10-09 VITALS — BP 126/72 | Ht 68.0 in | Wt 224.0 lb

## 2019-10-09 DIAGNOSIS — N183 Chronic kidney disease, stage 3 unspecified: Secondary | ICD-10-CM

## 2019-10-09 DIAGNOSIS — I447 Left bundle-branch block, unspecified: Secondary | ICD-10-CM

## 2019-10-09 DIAGNOSIS — I480 Paroxysmal atrial fibrillation: Secondary | ICD-10-CM

## 2019-10-09 DIAGNOSIS — I5032 Chronic diastolic (congestive) heart failure: Secondary | ICD-10-CM | POA: Diagnosis not present

## 2019-10-09 DIAGNOSIS — I10 Essential (primary) hypertension: Secondary | ICD-10-CM

## 2019-10-09 NOTE — Patient Instructions (Signed)
Medication Instructions:  Continue all current medications.  Labwork: none  Testing/Procedures: none  Follow-Up: 3 months   Any Other Special Instructions Will Be Listed Below (If Applicable).  If you need a refill on your cardiac medications before your next appointment, please call your pharmacy.  

## 2019-10-09 NOTE — Progress Notes (Signed)
Virtual Visit via Telephone Note   This visit type was conducted due to national recommendations for restrictions regarding the COVID-19 Pandemic (e.g. social distancing) in an effort to limit this patient's exposure and mitigate transmission in our community.  Due to her co-morbid illnesses, this patient is at least at moderate risk for complications without adequate follow up.  This format is felt to be most appropriate for this patient at this time.  The patient did not have access to video technology/had technical difficulties with video requiring transitioning to audio format only (telephone).  All issues noted in this document were discussed and addressed.  No physical exam could be performed with this format.  Please refer to the patient's chart for her  consent to telehealth for [facility].   The patient was identified using 2 identifiers.  Date:  10/09/2019   ID:  [name], DOB [birth year], MRN [id number]  Patient Location: Home Provider Location: Office  PCP:  [name], MD  Cardiologist:  Kate Sable, MD  Electrophysiologist:  None   Evaluation Performed:  Follow-Up Visit  Chief Complaint:  CHF  History of Present Illness:    [name] is a 85 y.o. female with a past medical history significant for chronic diastolic heart failure, chronic left bundle branch block, hypertension, and chronic kidney disease stage III.  She was hospitalized in March 2021 for abdominal pain and vomiting and was found to have acute pancreatitis.  She developed new onset rapid atrial fibrillation.  She was anticoagulated with Eliquis.  She converted back to sinus rhythm and was eventually placed on Cardizem CD.  She did develop some worsening shortness of breath after having received IV fluids and was diuresed with IV Lasix and then given torsemide at the time of discharge.  She denies palpitations. She has very little leg swelling. She denies chest pain and shortness of  breath.    Past Medical History:  Diagnosis Date  . Arthritis   . Chronic diastolic heart failure ([place])   . CKD (chronic kidney disease) stage 3, GFR 30-59 ml/min   . Essential hypertension   . GERD (gastroesophageal reflux disease)   . Hemorrhoids   . History of stroke   . Type 2 diabetes mellitus ([place])    Past Surgical History:  Procedure Laterality Date  . BACK SURGERY    . CATARACT EXTRACTION W/PHACO Left 02/28/2013   Procedure: CATARACT EXTRACTION PHACO AND INTRAOCULAR LENS PLACEMENT (IOL) CDE=15.60;  Surgeon: Elta Guadeloupe T. Gershon Crane, MD;  Location: [facility];  Service: Ophthalmology;  Laterality: Left;  . CHOLECYSTECTOMY    . COLONOSCOPY  12/12/2003   RMR: Normal rectum.  Long capacious tortuous colon, colonic mucosa appeared normal  . COLONOSCOPY N/A 03/21/2014   Dr. [name]: internal and external hemorrhoids, torturous colon, colonic diverticulosis   . ESOPHAGOGASTRODUODENOSCOPY  12/28/2001   NLG:[id number] sliding hiatal hernia/. Three small bulbar ulcers, two with stigmata of bleeding and these were coagulated using heater probe unit   . ESOPHAGOGASTRODUODENOSCOPY N/A 03/21/2014   Dr. [name]: cervical esophageal web s/p dilation, negative H.pylori   . EYE SURGERY    . HIP FRACTURE SURGERY  2008  . JOINT REPLACEMENT     Rt hip, ????? sounds like just for fracture  . MALONEY DILATION N/A 03/21/2014   Procedure: [name] DILATION;  Surgeon: [name], MD;  Location: [facility];  Service: Endoscopy;  Laterality: N/A;  . SAVORY DILATION N/A 03/21/2014   Procedure: SAVORY DILATION;  Surgeon: [name],  MD;  Location: [facility];  Service: Endoscopy;  Laterality: N/A;     Current Meds  Medication Sig  . acetaminophen (TYLENOL) 325 MG tablet Take 2 tablets (650 mg total) by mouth every 6 (six) hours as needed for mild pain, fever or headache (or Fever >/= 101).  Marland Kitchen apixaban (ELIQUIS) 2.5 MG TABS tablet Take 1 tablet (2.5 mg total) by mouth 2 (two) times daily.  Marland Kitchen diltiazem  (CARDIZEM CD) 120 MG 24 hr capsule Take 1 capsule (120 mg total) by mouth daily.  Marland Kitchen gabapentin (NEURONTIN) 300 MG capsule Take 300 mg by mouth 2 (two) times daily.   . hydrALAZINE (APRESOLINE) 50 MG tablet Take 1.5 tablets (75 mg total) by mouth 2 (two) times daily.  . hydrocortisone (ANUSOL-HC) 2.5 % rectal cream Place 1 application rectally 2 (two) times daily.  . insulin aspart (NOVOLOG) 100 UNIT/ML FlexPen Inject 0-10 Units into the skin 3 (three) times daily with meals. insulin aspart (novoLOG) injection 0-10 Units 0-10 Units Subcutaneous, 3 times daily with meals CBG < 70: Implement Hypoglycemia Standing Orders and refer to Hypoglycemia Standing Orders sidebar report  CBG 70 - 120: 0 unit CBG 121 - 150: 0 unit  CBG 151 - 200: 1 unit CBG 201 - 250: 2 units CBG 251 - 300: 4 units CBG 301 - 350: 6 units  CBG 351 - 400: 8 units  CBG > 400: 10 units  . insulin aspart (NOVOLOG) 100 UNIT/ML injection Inject 10 Units into the skin 3 (three) times daily before meals. (Patient taking differently: Inject 30 Units into the skin 3 (three) times daily before meals. )  . insulin detemir (LEVEMIR) 100 UNIT/ML injection Inject 0.7 mLs (70 Units total) into the skin at bedtime.  . isosorbide mononitrate (IMDUR) 30 MG 24 hr tablet Take 1 tablet (30 mg total) by mouth daily.  . simvastatin (ZOCOR) 20 MG tablet Take 20 mg by mouth daily.  Marland Kitchen torsemide (DEMADEX) 20 MG tablet Take 1 tablet (20 mg total) by mouth 2 (two) times daily.     Allergies:   Patient has no known allergies.   Social History   Tobacco Use  . Smoking status: Never Smoker  . Smokeless tobacco: Never Used  Substance Use Topics  . Alcohol use: No  . Drug use: No     Family Hx: The patient's family history includes Crohn's disease in her daughter; Diabetes in her brother, sister, and sister; Heart attack in her sister; Hypertension in her mother and sister. There is no history of Colon cancer.  ROS:   Please see the history of present  illness.     All other systems reviewed and are negative.   Prior CV studies:   The following studies were reviewed today:  Echocardiogram: 06/2019 IMPRESSIONS    1. Left ventricular ejection fraction, by estimation, is 60 to 65%. The  left ventricle has normal function. The left ventrical has no regional  wall motion abnormalities. There is moderately increased left ventricular  hypertrophy. Left ventricular  diastolic parameters are consistent with Grade II diastolic dysfunction  (pseudonormalization). Elevated left atrial pressure.  2. Right ventricular systolic function is normal. The right ventricular  size is normal.  3. Left atrial size was severely dilated.  4. The mitral valve is normal in structure and function. trivial mitral  valve regurgitation. No evidence of mitral stenosis.  5. The aortic valve is tricuspid. Aortic valve regurgitation is not  visualized. No aortic stenosis is present.  6. The  inferior vena cava is normal in size with greater than 50%  respiratory variability, suggesting right atrial pressure of 3 mmHg.  NST: 06/2019  Nuclear stress EF: 55%.  The left ventricular ejection fraction is normal (55-65%).  There was no ST segment deviation noted during stress.  There is a medium defect of moderate severity present in the basal anterior, basal anteroseptal, mid anterior and mid anteroseptal location. The defect is non-reversible and consistent with breast attenuation artifact.  This is a low risk study.   Labs/Other Tests and Data Reviewed:    EKG:  No ECG reviewed.  Recent Labs: 07/23/2019: B Natriuretic Peptide 104.0; Magnesium 2.2 07/24/2019: TSH 0.420 07/27/2019: ALT 21 07/31/2019: BUN 37; Creatinine, Ser 1.49; Hemoglobin 10.2; Platelets 290; Potassium 3.6; Sodium 140   Recent Lipid Panel Lab Results  Component Value Date/Time   CHOL 118 07/24/2019 04:19 AM   TRIG 35 07/24/2019 04:19 AM   HDL 38 (L) 07/24/2019 04:19 AM   CHOLHDL  3.1 07/24/2019 04:19 AM   LDLCALC 73 07/24/2019 04:19 AM    Wt Readings from Last 3 Encounters:  10/09/19 224 lb (101.6 kg)  08/04/19 221 lb 9.6 oz (100.5 kg)  07/31/19 218 lb 14.7 oz (99.3 kg)     Objective:    Vital Signs:  BP 126/72   Ht 5\' 8"  (1.727 m)   Wt 224 lb (101.6 kg)   BMI 34.06 kg/m    VITAL SIGNS:  reviewed  ASSESSMENT & PLAN:    1.  Chronic diastolic heart failure: Continue torsemide 20 mg twice daily.  2.  Paroxysmal atrial fibrillation: She developed atrial fibrillation in the setting of acute pancreatitis, UTI, and acute hypoxic respiratory failure.  Continue Cardizem CD 120 mg daily for rate control.  Continue apixaban 2.5 mg twice daily for systemic anticoagulation for the time being.  Pending repeat basic metabolic panel, this may need to be increased to 5 mg twice daily.  Her arrhythmia was likely triggered by acute illness.  If she has no recurrence over the next several months, one could consider discontinuation of anticoagulation altogether.  3.  Left bundle branch block: Chronic.  Stress test was low risk.  4.  Hypertension: BP is normal. No change to therapy.  5.  Chronic kidney disease stage III: She did not obtain the previously ordered basic metabolic panel.  She will get it done tomorrow.     COVID-19 Education: The signs and symptoms of COVID-19 were discussed with the patient and how to seek care for testing (follow up with PCP or arrange E-visit).  The importance of social distancing was discussed today.  Time:   Today, I have spent 20 minutes with the patient with telehealth technology discussing the above problems.     Medication Adjustments/Labs and Tests Ordered: Current medicines are reviewed at length with the patient today.  Concerns regarding medicines are outlined above.   Tests Ordered: No orders of the defined types were placed in this encounter.   Medication Changes: No orders of the defined types were placed in this  encounter.   Follow Up:  Virtual Visit  in 3 month(s)  Signed, Kate Sable, MD  10/09/2019 11:36 AM    [place]

## 2019-10-11 DIAGNOSIS — N183 Chronic kidney disease, stage 3 unspecified: Secondary | ICD-10-CM | POA: Diagnosis not present

## 2019-10-11 DIAGNOSIS — I5030 Unspecified diastolic (congestive) heart failure: Secondary | ICD-10-CM | POA: Diagnosis not present

## 2019-10-11 DIAGNOSIS — I447 Left bundle-branch block, unspecified: Secondary | ICD-10-CM | POA: Diagnosis not present

## 2019-10-11 DIAGNOSIS — I130 Hypertensive heart and chronic kidney disease with heart failure and stage 1 through stage 4 chronic kidney disease, or unspecified chronic kidney disease: Secondary | ICD-10-CM | POA: Diagnosis not present

## 2019-10-11 DIAGNOSIS — I480 Paroxysmal atrial fibrillation: Secondary | ICD-10-CM | POA: Diagnosis not present

## 2019-10-11 DIAGNOSIS — E1122 Type 2 diabetes mellitus with diabetic chronic kidney disease: Secondary | ICD-10-CM | POA: Diagnosis not present

## 2019-10-11 DIAGNOSIS — M1991 Primary osteoarthritis, unspecified site: Secondary | ICD-10-CM | POA: Diagnosis not present

## 2019-10-11 DIAGNOSIS — I2510 Atherosclerotic heart disease of native coronary artery without angina pectoris: Secondary | ICD-10-CM | POA: Diagnosis not present

## 2019-10-11 DIAGNOSIS — K859 Acute pancreatitis without necrosis or infection, unspecified: Secondary | ICD-10-CM | POA: Diagnosis not present

## 2019-10-13 DIAGNOSIS — I130 Hypertensive heart and chronic kidney disease with heart failure and stage 1 through stage 4 chronic kidney disease, or unspecified chronic kidney disease: Secondary | ICD-10-CM | POA: Diagnosis not present

## 2019-10-13 DIAGNOSIS — I480 Paroxysmal atrial fibrillation: Secondary | ICD-10-CM | POA: Diagnosis not present

## 2019-10-13 DIAGNOSIS — K859 Acute pancreatitis without necrosis or infection, unspecified: Secondary | ICD-10-CM | POA: Diagnosis not present

## 2019-10-13 DIAGNOSIS — E1122 Type 2 diabetes mellitus with diabetic chronic kidney disease: Secondary | ICD-10-CM | POA: Diagnosis not present

## 2019-10-13 DIAGNOSIS — I5030 Unspecified diastolic (congestive) heart failure: Secondary | ICD-10-CM | POA: Diagnosis not present

## 2019-10-13 DIAGNOSIS — M1991 Primary osteoarthritis, unspecified site: Secondary | ICD-10-CM | POA: Diagnosis not present

## 2019-10-13 DIAGNOSIS — N183 Chronic kidney disease, stage 3 unspecified: Secondary | ICD-10-CM | POA: Diagnosis not present

## 2019-10-13 DIAGNOSIS — I2510 Atherosclerotic heart disease of native coronary artery without angina pectoris: Secondary | ICD-10-CM | POA: Diagnosis not present

## 2019-10-13 DIAGNOSIS — I447 Left bundle-branch block, unspecified: Secondary | ICD-10-CM | POA: Diagnosis not present

## 2019-10-18 DIAGNOSIS — I4891 Unspecified atrial fibrillation: Secondary | ICD-10-CM | POA: Diagnosis not present

## 2019-10-18 DIAGNOSIS — M6281 Muscle weakness (generalized): Secondary | ICD-10-CM | POA: Diagnosis not present

## 2019-10-18 DIAGNOSIS — J810 Acute pulmonary edema: Secondary | ICD-10-CM | POA: Diagnosis not present

## 2019-10-18 DIAGNOSIS — I480 Paroxysmal atrial fibrillation: Secondary | ICD-10-CM | POA: Diagnosis not present

## 2019-10-18 DIAGNOSIS — E1122 Type 2 diabetes mellitus with diabetic chronic kidney disease: Secondary | ICD-10-CM | POA: Diagnosis not present

## 2019-10-18 DIAGNOSIS — M1991 Primary osteoarthritis, unspecified site: Secondary | ICD-10-CM | POA: Diagnosis not present

## 2019-10-18 DIAGNOSIS — J9601 Acute respiratory failure with hypoxia: Secondary | ICD-10-CM | POA: Diagnosis not present

## 2019-10-18 DIAGNOSIS — K859 Acute pancreatitis without necrosis or infection, unspecified: Secondary | ICD-10-CM | POA: Diagnosis not present

## 2019-10-18 DIAGNOSIS — I130 Hypertensive heart and chronic kidney disease with heart failure and stage 1 through stage 4 chronic kidney disease, or unspecified chronic kidney disease: Secondary | ICD-10-CM | POA: Diagnosis not present

## 2019-10-18 DIAGNOSIS — N1832 Chronic kidney disease, stage 3b: Secondary | ICD-10-CM | POA: Diagnosis not present

## 2019-10-18 DIAGNOSIS — I447 Left bundle-branch block, unspecified: Secondary | ICD-10-CM | POA: Diagnosis not present

## 2019-10-18 DIAGNOSIS — N183 Chronic kidney disease, stage 3 unspecified: Secondary | ICD-10-CM | POA: Diagnosis not present

## 2019-10-18 DIAGNOSIS — I5032 Chronic diastolic (congestive) heart failure: Secondary | ICD-10-CM | POA: Diagnosis not present

## 2019-10-18 DIAGNOSIS — I2510 Atherosclerotic heart disease of native coronary artery without angina pectoris: Secondary | ICD-10-CM | POA: Diagnosis not present

## 2019-10-18 DIAGNOSIS — I5030 Unspecified diastolic (congestive) heart failure: Secondary | ICD-10-CM | POA: Diagnosis not present

## 2019-10-20 DIAGNOSIS — I447 Left bundle-branch block, unspecified: Secondary | ICD-10-CM | POA: Diagnosis not present

## 2019-10-20 DIAGNOSIS — E1122 Type 2 diabetes mellitus with diabetic chronic kidney disease: Secondary | ICD-10-CM | POA: Diagnosis not present

## 2019-10-20 DIAGNOSIS — I480 Paroxysmal atrial fibrillation: Secondary | ICD-10-CM | POA: Diagnosis not present

## 2019-10-20 DIAGNOSIS — I2510 Atherosclerotic heart disease of native coronary artery without angina pectoris: Secondary | ICD-10-CM | POA: Diagnosis not present

## 2019-10-20 DIAGNOSIS — M1991 Primary osteoarthritis, unspecified site: Secondary | ICD-10-CM | POA: Diagnosis not present

## 2019-10-20 DIAGNOSIS — I130 Hypertensive heart and chronic kidney disease with heart failure and stage 1 through stage 4 chronic kidney disease, or unspecified chronic kidney disease: Secondary | ICD-10-CM | POA: Diagnosis not present

## 2019-10-20 DIAGNOSIS — I5030 Unspecified diastolic (congestive) heart failure: Secondary | ICD-10-CM | POA: Diagnosis not present

## 2019-10-20 DIAGNOSIS — K859 Acute pancreatitis without necrosis or infection, unspecified: Secondary | ICD-10-CM | POA: Diagnosis not present

## 2019-10-20 DIAGNOSIS — N183 Chronic kidney disease, stage 3 unspecified: Secondary | ICD-10-CM | POA: Diagnosis not present

## 2019-10-23 DIAGNOSIS — K859 Acute pancreatitis without necrosis or infection, unspecified: Secondary | ICD-10-CM | POA: Diagnosis not present

## 2019-10-23 DIAGNOSIS — I5030 Unspecified diastolic (congestive) heart failure: Secondary | ICD-10-CM | POA: Diagnosis not present

## 2019-10-23 DIAGNOSIS — E1122 Type 2 diabetes mellitus with diabetic chronic kidney disease: Secondary | ICD-10-CM | POA: Diagnosis not present

## 2019-10-23 DIAGNOSIS — N183 Chronic kidney disease, stage 3 unspecified: Secondary | ICD-10-CM | POA: Diagnosis not present

## 2019-10-23 DIAGNOSIS — M1991 Primary osteoarthritis, unspecified site: Secondary | ICD-10-CM | POA: Diagnosis not present

## 2019-10-23 DIAGNOSIS — I130 Hypertensive heart and chronic kidney disease with heart failure and stage 1 through stage 4 chronic kidney disease, or unspecified chronic kidney disease: Secondary | ICD-10-CM | POA: Diagnosis not present

## 2019-10-23 DIAGNOSIS — I480 Paroxysmal atrial fibrillation: Secondary | ICD-10-CM | POA: Diagnosis not present

## 2019-10-23 DIAGNOSIS — I447 Left bundle-branch block, unspecified: Secondary | ICD-10-CM | POA: Diagnosis not present

## 2019-10-23 DIAGNOSIS — I2510 Atherosclerotic heart disease of native coronary artery without angina pectoris: Secondary | ICD-10-CM | POA: Diagnosis not present

## 2019-11-03 DIAGNOSIS — K219 Gastro-esophageal reflux disease without esophagitis: Secondary | ICD-10-CM | POA: Diagnosis not present

## 2019-11-03 DIAGNOSIS — N1832 Chronic kidney disease, stage 3b: Secondary | ICD-10-CM | POA: Diagnosis not present

## 2019-11-17 DIAGNOSIS — N1832 Chronic kidney disease, stage 3b: Secondary | ICD-10-CM | POA: Diagnosis not present

## 2019-11-17 DIAGNOSIS — K859 Acute pancreatitis without necrosis or infection, unspecified: Secondary | ICD-10-CM | POA: Diagnosis not present

## 2019-11-17 DIAGNOSIS — M6281 Muscle weakness (generalized): Secondary | ICD-10-CM | POA: Diagnosis not present

## 2019-11-17 DIAGNOSIS — J810 Acute pulmonary edema: Secondary | ICD-10-CM | POA: Diagnosis not present

## 2019-11-17 DIAGNOSIS — J9601 Acute respiratory failure with hypoxia: Secondary | ICD-10-CM | POA: Diagnosis not present

## 2019-11-17 DIAGNOSIS — I4891 Unspecified atrial fibrillation: Secondary | ICD-10-CM | POA: Diagnosis not present

## 2019-11-17 DIAGNOSIS — I5032 Chronic diastolic (congestive) heart failure: Secondary | ICD-10-CM | POA: Diagnosis not present

## 2019-12-18 DIAGNOSIS — J810 Acute pulmonary edema: Secondary | ICD-10-CM | POA: Diagnosis not present

## 2019-12-18 DIAGNOSIS — I4891 Unspecified atrial fibrillation: Secondary | ICD-10-CM | POA: Diagnosis not present

## 2019-12-18 DIAGNOSIS — J9601 Acute respiratory failure with hypoxia: Secondary | ICD-10-CM | POA: Diagnosis not present

## 2019-12-18 DIAGNOSIS — N1832 Chronic kidney disease, stage 3b: Secondary | ICD-10-CM | POA: Diagnosis not present

## 2019-12-18 DIAGNOSIS — M6281 Muscle weakness (generalized): Secondary | ICD-10-CM | POA: Diagnosis not present

## 2019-12-18 DIAGNOSIS — K859 Acute pancreatitis without necrosis or infection, unspecified: Secondary | ICD-10-CM | POA: Diagnosis not present

## 2019-12-18 DIAGNOSIS — I5032 Chronic diastolic (congestive) heart failure: Secondary | ICD-10-CM | POA: Diagnosis not present

## 2019-12-26 ENCOUNTER — Encounter (HOSPITAL_COMMUNITY): Payer: Self-pay | Admitting: Emergency Medicine

## 2019-12-26 ENCOUNTER — Emergency Department (HOSPITAL_COMMUNITY): Payer: Medicare HMO

## 2019-12-26 ENCOUNTER — Other Ambulatory Visit: Payer: Self-pay

## 2019-12-26 ENCOUNTER — Inpatient Hospital Stay (HOSPITAL_COMMUNITY)
Admission: EM | Admit: 2019-12-26 | Discharge: 2020-01-03 | DRG: 438 | Disposition: A | Discharge status: Skilled Nursing Facility | Payer: Medicare HMO | Attending: Internal Medicine | Admitting: Internal Medicine

## 2019-12-26 ENCOUNTER — Inpatient Hospital Stay (HOSPITAL_COMMUNITY): Payer: Medicare HMO

## 2019-12-26 DIAGNOSIS — Z833 Family history of diabetes mellitus: Secondary | ICD-10-CM

## 2019-12-26 DIAGNOSIS — R339 Retention of urine, unspecified: Secondary | ICD-10-CM | POA: Diagnosis not present

## 2019-12-26 DIAGNOSIS — R338 Other retention of urine: Secondary | ICD-10-CM

## 2019-12-26 DIAGNOSIS — D631 Anemia in chronic kidney disease: Secondary | ICD-10-CM | POA: Diagnosis present

## 2019-12-26 DIAGNOSIS — R109 Unspecified abdominal pain: Secondary | ICD-10-CM | POA: Diagnosis not present

## 2019-12-26 DIAGNOSIS — K219 Gastro-esophageal reflux disease without esophagitis: Secondary | ICD-10-CM | POA: Diagnosis not present

## 2019-12-26 DIAGNOSIS — K429 Umbilical hernia without obstruction or gangrene: Secondary | ICD-10-CM | POA: Diagnosis not present

## 2019-12-26 DIAGNOSIS — Z20822 Contact with and (suspected) exposure to covid-19: Secondary | ICD-10-CM | POA: Diagnosis present

## 2019-12-26 DIAGNOSIS — R0602 Shortness of breath: Secondary | ICD-10-CM | POA: Diagnosis not present

## 2019-12-26 DIAGNOSIS — Z79899 Other long term (current) drug therapy: Secondary | ICD-10-CM | POA: Diagnosis not present

## 2019-12-26 DIAGNOSIS — I5082 Biventricular heart failure: Secondary | ICD-10-CM | POA: Diagnosis present

## 2019-12-26 DIAGNOSIS — N1832 Chronic kidney disease, stage 3b: Secondary | ICD-10-CM | POA: Diagnosis present

## 2019-12-26 DIAGNOSIS — Z9049 Acquired absence of other specified parts of digestive tract: Secondary | ICD-10-CM

## 2019-12-26 DIAGNOSIS — I482 Chronic atrial fibrillation, unspecified: Secondary | ICD-10-CM | POA: Diagnosis not present

## 2019-12-26 DIAGNOSIS — J9601 Acute respiratory failure with hypoxia: Secondary | ICD-10-CM | POA: Diagnosis not present

## 2019-12-26 DIAGNOSIS — I10 Essential (primary) hypertension: Secondary | ICD-10-CM | POA: Diagnosis not present

## 2019-12-26 DIAGNOSIS — K859 Acute pancreatitis without necrosis or infection, unspecified: Secondary | ICD-10-CM | POA: Diagnosis not present

## 2019-12-26 DIAGNOSIS — N183 Chronic kidney disease, stage 3 unspecified: Secondary | ICD-10-CM | POA: Diagnosis not present

## 2019-12-26 DIAGNOSIS — Z7901 Long term (current) use of anticoagulants: Secondary | ICD-10-CM | POA: Diagnosis not present

## 2019-12-26 DIAGNOSIS — E1140 Type 2 diabetes mellitus with diabetic neuropathy, unspecified: Secondary | ICD-10-CM | POA: Diagnosis not present

## 2019-12-26 DIAGNOSIS — Z8249 Family history of ischemic heart disease and other diseases of the circulatory system: Secondary | ICD-10-CM

## 2019-12-26 DIAGNOSIS — E1122 Type 2 diabetes mellitus with diabetic chronic kidney disease: Secondary | ICD-10-CM | POA: Diagnosis present

## 2019-12-26 DIAGNOSIS — J9811 Atelectasis: Secondary | ICD-10-CM | POA: Diagnosis not present

## 2019-12-26 DIAGNOSIS — R0600 Dyspnea, unspecified: Secondary | ICD-10-CM

## 2019-12-26 DIAGNOSIS — R0689 Other abnormalities of breathing: Secondary | ICD-10-CM | POA: Diagnosis not present

## 2019-12-26 DIAGNOSIS — Z6838 Body mass index (BMI) 38.0-38.9, adult: Secondary | ICD-10-CM | POA: Diagnosis not present

## 2019-12-26 DIAGNOSIS — E118 Type 2 diabetes mellitus with unspecified complications: Secondary | ICD-10-CM | POA: Diagnosis present

## 2019-12-26 DIAGNOSIS — Z794 Long term (current) use of insulin: Secondary | ICD-10-CM | POA: Diagnosis not present

## 2019-12-26 DIAGNOSIS — R0789 Other chest pain: Secondary | ICD-10-CM | POA: Diagnosis not present

## 2019-12-26 DIAGNOSIS — K8500 Idiopathic acute pancreatitis without necrosis or infection: Secondary | ICD-10-CM | POA: Diagnosis not present

## 2019-12-26 DIAGNOSIS — R0902 Hypoxemia: Secondary | ICD-10-CM | POA: Diagnosis not present

## 2019-12-26 DIAGNOSIS — R1084 Generalized abdominal pain: Secondary | ICD-10-CM | POA: Diagnosis not present

## 2019-12-26 DIAGNOSIS — I130 Hypertensive heart and chronic kidney disease with heart failure and stage 1 through stage 4 chronic kidney disease, or unspecified chronic kidney disease: Secondary | ICD-10-CM | POA: Diagnosis present

## 2019-12-26 DIAGNOSIS — K625 Hemorrhage of anus and rectum: Secondary | ICD-10-CM

## 2019-12-26 DIAGNOSIS — I480 Paroxysmal atrial fibrillation: Secondary | ICD-10-CM | POA: Diagnosis not present

## 2019-12-26 DIAGNOSIS — R1110 Vomiting, unspecified: Secondary | ICD-10-CM | POA: Diagnosis not present

## 2019-12-26 DIAGNOSIS — R29898 Other symptoms and signs involving the musculoskeletal system: Secondary | ICD-10-CM | POA: Diagnosis not present

## 2019-12-26 DIAGNOSIS — I5032 Chronic diastolic (congestive) heart failure: Secondary | ICD-10-CM | POA: Diagnosis present

## 2019-12-26 DIAGNOSIS — Z7401 Bed confinement status: Secondary | ICD-10-CM | POA: Diagnosis not present

## 2019-12-26 DIAGNOSIS — I4891 Unspecified atrial fibrillation: Secondary | ICD-10-CM | POA: Diagnosis not present

## 2019-12-26 DIAGNOSIS — E11649 Type 2 diabetes mellitus with hypoglycemia without coma: Secondary | ICD-10-CM | POA: Diagnosis present

## 2019-12-26 DIAGNOSIS — Z8673 Personal history of transient ischemic attack (TIA), and cerebral infarction without residual deficits: Secondary | ICD-10-CM | POA: Diagnosis not present

## 2019-12-26 DIAGNOSIS — I959 Hypotension, unspecified: Secondary | ICD-10-CM | POA: Diagnosis not present

## 2019-12-26 DIAGNOSIS — E119 Type 2 diabetes mellitus without complications: Secondary | ICD-10-CM | POA: Diagnosis present

## 2019-12-26 DIAGNOSIS — R079 Chest pain, unspecified: Secondary | ICD-10-CM | POA: Diagnosis not present

## 2019-12-26 LAB — COMPREHENSIVE METABOLIC PANEL
ALT: 16 U/L (ref 0–44)
AST: 18 U/L (ref 15–41)
Albumin: 3.6 g/dL (ref 3.5–5.0)
Alkaline Phosphatase: 63 U/L (ref 38–126)
Anion gap: 11 (ref 5–15)
BUN: 24 mg/dL — ABNORMAL HIGH (ref 8–23)
CO2: 25 mmol/L (ref 22–32)
Calcium: 8.7 mg/dL — ABNORMAL LOW (ref 8.9–10.3)
Chloride: 103 mmol/L (ref 98–111)
Creatinine, Ser: 1.31 mg/dL — ABNORMAL HIGH (ref 0.44–1.00)
GFR calc Af Amer: 44 mL/min — ABNORMAL LOW (ref >60)
GFR calc non Af Amer: 38 mL/min — ABNORMAL LOW (ref >60)
Glucose, Bld: 345 mg/dL — ABNORMAL HIGH (ref 70–99)
Potassium: 4 mmol/L (ref 3.5–5.1)
Sodium: 139 mmol/L (ref 135–145)
Total Bilirubin: 0.5 mg/dL (ref 0.3–1.2)
Total Protein: 8.1 g/dL (ref 6.5–8.1)

## 2019-12-26 LAB — CBC WITH DIFFERENTIAL/PLATELET
Abs Immature Granulocytes: 0.04 10*3/uL (ref 0.00–0.07)
Basophils Absolute: 0 10*3/uL (ref 0.0–0.1)
Basophils Relative: 0 %
Eosinophils Absolute: 0.1 10*3/uL (ref 0.0–0.5)
Eosinophils Relative: 0 %
HCT: 39.9 % (ref 36.0–46.0)
Hemoglobin: 12.4 g/dL (ref 12.0–15.0)
Immature Granulocytes: 0 %
Lymphocytes Relative: 13 %
Lymphs Abs: 1.8 10*3/uL (ref 0.7–4.0)
MCH: 27.8 pg (ref 26.0–34.0)
MCHC: 31.1 g/dL (ref 30.0–36.0)
MCV: 89.5 fL (ref 80.0–100.0)
Monocytes Absolute: 0.6 10*3/uL (ref 0.1–1.0)
Monocytes Relative: 4 %
Neutro Abs: 10.8 10*3/uL — ABNORMAL HIGH (ref 1.7–7.7)
Neutrophils Relative %: 83 %
Platelets: 263 10*3/uL (ref 150–400)
RBC: 4.46 MIL/uL (ref 3.87–5.11)
RDW: 16.1 % — ABNORMAL HIGH (ref 11.5–15.5)
WBC: 13.2 10*3/uL — ABNORMAL HIGH (ref 4.0–10.5)
nRBC: 0 % (ref 0.0–0.2)

## 2019-12-26 LAB — URINALYSIS, ROUTINE W REFLEX MICROSCOPIC
Bilirubin Urine: NEGATIVE
Glucose, UA: >=500 mg/dL — AB
Ketones, ur: NEGATIVE mg/dL
Leukocytes,Ua: NEGATIVE
Nitrite: NEGATIVE
Protein, ur: 100 mg/dL — AB
Specific Gravity, Urine: 1.01 (ref 1.005–1.030)
pH: 7 (ref 5.0–8.0)

## 2019-12-26 LAB — BRAIN NATRIURETIC PEPTIDE: B Natriuretic Peptide: 116 pg/mL — ABNORMAL HIGH (ref 0.0–100.0)

## 2019-12-26 LAB — LIPASE, BLOOD: Lipase: 1444 U/L — ABNORMAL HIGH (ref 11–51)

## 2019-12-26 LAB — SARS CORONAVIRUS 2 BY RT PCR (HOSPITAL ORDER, PERFORMED IN ~~LOC~~ HOSPITAL LAB): SARS Coronavirus 2: NEGATIVE

## 2019-12-26 LAB — TROPONIN I (HIGH SENSITIVITY)
Troponin I (High Sensitivity): 18 ng/L — ABNORMAL HIGH (ref <18)
Troponin I (High Sensitivity): 20 ng/L — ABNORMAL HIGH (ref <18)

## 2019-12-26 MED ORDER — SODIUM CHLORIDE 0.9% FLUSH
3.0000 mL | Freq: Two times a day (BID) | INTRAVENOUS | Status: DC
  Administered 2019-12-27 – 2020-01-03 (×11): 3 mL via INTRAVENOUS

## 2019-12-26 MED ORDER — SODIUM CHLORIDE 0.9 % IV SOLN: Freq: Once | INTRAVENOUS | Status: DC

## 2019-12-26 MED ORDER — ONDANSETRON HCL 4 MG PO TABS
4.0000 mg | ORAL_TABLET | Freq: Four times a day (QID) | ORAL | Status: DC | PRN
  Administered 2019-12-27: 4 mg via ORAL
  Filled 2019-12-26: qty 1

## 2019-12-26 MED ORDER — DILTIAZEM HCL ER COATED BEADS 120 MG PO CP24
120.0000 mg | ORAL_CAPSULE | Freq: Every day | ORAL | Status: DC
  Administered 2019-12-26 – 2020-01-03 (×9): 120 mg via ORAL
  Filled 2019-12-26 (×10): qty 1

## 2019-12-26 MED ORDER — KCL IN DEXTROSE-NACL 20-5-0.45 MEQ/L-%-% IV SOLN
INTRAVENOUS | Status: DC
  Filled 2019-12-26 (×5): qty 1000

## 2019-12-26 MED ORDER — MORPHINE SULFATE (PF) 4 MG/ML IV SOLN
4.0000 mg | Freq: Once | INTRAVENOUS | Status: AC
  Administered 2019-12-26: 4 mg via INTRAVENOUS
  Filled 2019-12-26: qty 1

## 2019-12-26 MED ORDER — OXYCODONE HCL 5 MG PO TABS
5.0000 mg | ORAL_TABLET | ORAL | Status: DC | PRN
  Administered 2019-12-26 – 2019-12-31 (×7): 5 mg via ORAL
  Filled 2019-12-26 (×7): qty 1

## 2019-12-26 MED ORDER — HYDROMORPHONE HCL 1 MG/ML IJ SOLN
0.5000 mg | INTRAMUSCULAR | Status: DC | PRN
  Administered 2019-12-27: 0.5 mg via INTRAVENOUS
  Administered 2019-12-27 (×2): 1 mg via INTRAVENOUS
  Administered 2019-12-27 – 2019-12-28 (×2): 0.5 mg via INTRAVENOUS
  Administered 2019-12-28: 1 mg via INTRAVENOUS
  Filled 2019-12-26: qty 1
  Filled 2019-12-26 (×2): qty 0.5
  Filled 2019-12-26 (×2): qty 1
  Filled 2019-12-26: qty 0.5

## 2019-12-26 MED ORDER — PROMETHAZINE HCL 25 MG/ML IJ SOLN
12.5000 mg | Freq: Once | INTRAMUSCULAR | Status: AC
  Administered 2019-12-26: 12.5 mg via INTRAVENOUS
  Filled 2019-12-26: qty 1

## 2019-12-26 MED ORDER — INSULIN DETEMIR 100 UNIT/ML ~~LOC~~ SOLN
40.0000 [IU] | Freq: Every day | SUBCUTANEOUS | Status: DC
  Administered 2019-12-26: 40 [IU] via SUBCUTANEOUS
  Filled 2019-12-26 (×2): qty 0.4

## 2019-12-26 MED ORDER — ONDANSETRON HCL 4 MG/2ML IJ SOLN
4.0000 mg | Freq: Four times a day (QID) | INTRAMUSCULAR | Status: DC | PRN
  Administered 2019-12-26 – 2019-12-28 (×4): 4 mg via INTRAVENOUS
  Filled 2019-12-26 (×5): qty 2

## 2019-12-26 MED ORDER — GABAPENTIN 300 MG PO CAPS
300.0000 mg | ORAL_CAPSULE | Freq: Two times a day (BID) | ORAL | Status: DC
  Administered 2019-12-26 – 2020-01-03 (×16): 300 mg via ORAL
  Filled 2019-12-26 (×16): qty 1

## 2019-12-26 MED ORDER — ACETAMINOPHEN 325 MG PO TABS
650.0000 mg | ORAL_TABLET | Freq: Four times a day (QID) | ORAL | Status: DC | PRN
  Administered 2020-01-01: 650 mg via ORAL
  Filled 2019-12-26: qty 2

## 2019-12-26 MED ORDER — SODIUM CHLORIDE 0.9 % IV BOLUS
1000.0000 mL | Freq: Once | INTRAVENOUS | Status: AC
  Administered 2019-12-26: 1000 mL via INTRAVENOUS

## 2019-12-26 MED ORDER — SODIUM CHLORIDE 0.9% FLUSH: 3.0000 mL | INTRAVENOUS | Status: DC | PRN

## 2019-12-26 MED ORDER — ISOSORBIDE MONONITRATE ER 60 MG PO TB24
30.0000 mg | ORAL_TABLET | Freq: Every day | ORAL | Status: DC
  Administered 2019-12-26 – 2020-01-01 (×7): 30 mg via ORAL
  Filled 2019-12-26 (×8): qty 1

## 2019-12-26 MED ORDER — ALBUTEROL SULFATE (2.5 MG/3ML) 0.083% IN NEBU
2.5000 mg | INHALATION_SOLUTION | RESPIRATORY_TRACT | Status: DC | PRN
  Administered 2019-12-29: 2.5 mg via RESPIRATORY_TRACT
  Filled 2019-12-26: qty 3

## 2019-12-26 MED ORDER — FAMOTIDINE IN NACL 20-0.9 MG/50ML-% IV SOLN
20.0000 mg | INTRAVENOUS | Status: DC
  Administered 2019-12-26 – 2020-01-02 (×8): 20 mg via INTRAVENOUS
  Filled 2019-12-26 (×8): qty 50

## 2019-12-26 MED ORDER — HYDROCORTISONE (PERIANAL) 2.5 % EX CREA: 1.0000 "application " | TOPICAL_CREAM | Freq: Two times a day (BID) | CUTANEOUS | Status: DC

## 2019-12-26 MED ORDER — APIXABAN 2.5 MG PO TABS
2.5000 mg | ORAL_TABLET | Freq: Two times a day (BID) | ORAL | Status: DC
  Administered 2019-12-26 – 2020-01-03 (×16): 2.5 mg via ORAL
  Filled 2019-12-26 (×16): qty 1

## 2019-12-26 MED ORDER — ONDANSETRON HCL 4 MG/2ML IJ SOLN
4.0000 mg | Freq: Once | INTRAMUSCULAR | Status: AC
  Administered 2019-12-26: 4 mg via INTRAVENOUS
  Filled 2019-12-26: qty 2

## 2019-12-26 MED ORDER — SODIUM CHLORIDE 0.9 % IV SOLN
250.0000 mL | INTRAVENOUS | Status: DC | PRN
  Administered 2020-01-02: 250 mL via INTRAVENOUS

## 2019-12-26 MED ORDER — TRAZODONE HCL 50 MG PO TABS: 50.0000 mg | ORAL_TABLET | Freq: Every evening | ORAL | Status: DC | PRN

## 2019-12-26 MED ORDER — LABETALOL HCL 5 MG/ML IV SOLN
20.0000 mg | Freq: Once | INTRAVENOUS | Status: AC
  Administered 2019-12-26: 20 mg via INTRAVENOUS
  Filled 2019-12-26: qty 4

## 2019-12-26 MED ORDER — LABETALOL HCL 5 MG/ML IV SOLN: 10.0000 mg | INTRAVENOUS | Status: DC | PRN

## 2019-12-26 MED ORDER — IOHEXOL 300 MG/ML  SOLN
75.0000 mL | Freq: Once | INTRAMUSCULAR | Status: AC | PRN
  Administered 2019-12-26: 75 mL via INTRAVENOUS

## 2019-12-26 MED ORDER — POLYETHYLENE GLYCOL 3350 17 G PO PACK: 17.0000 g | PACK | Freq: Every day | ORAL | Status: DC | PRN

## 2019-12-26 MED ORDER — HYDRALAZINE HCL 25 MG PO TABS
75.0000 mg | ORAL_TABLET | Freq: Two times a day (BID) | ORAL | Status: DC
  Administered 2019-12-26 – 2019-12-27 (×2): 75 mg via ORAL
  Filled 2019-12-26 (×2): qty 3

## 2019-12-26 NOTE — ED Triage Notes (Signed)
PT brought in by [facility] from home c/o epigastric nagging pain with nausea that started yesterday evening. PT states she had some vomiting last night and no diarrhea.

## 2019-12-26 NOTE — ED Provider Notes (Signed)
[facility] EMERGENCY DEPARTMENT Provider Note   CSN: [id number] Arrival date & time: 12/26/19  0093     History Chief Complaint  Patient presents with  . Abdominal Pain    [name] is a 85 y.o. female.  She is brought in by EMS for evaluation of epigastric abdominal pain 8 out of 10 intensity that started around 8 PM last night.  Associated with nausea and 2 episodes of vomiting.  No diarrhea.  No blood from above or below.  Has had a similar exacerbation in the past of this.  Does not know the etiology.  Appears short of breath but denies shortness of breath.  No urinary symptoms.  No chest pain.  Denies fevers or chills.  The history is provided by the patient and the EMS personnel.  Abdominal Pain Pain location:  Epigastric Pain quality: aching   Pain severity:  Severe Onset quality:  Gradual Duration:  12 hours Timing:  Constant Progression:  Worsening Chronicity:  Recurrent Context: not trauma   Relieved by:  None tried Worsened by:  Nothing Ineffective treatments:  None tried Associated symptoms: nausea and vomiting   Associated symptoms: no chest pain, no cough, no diarrhea, no dysuria, no fever, no hematemesis, no hematochezia, no hematuria, no shortness of breath and no sore throat        Past Medical History:  Diagnosis Date  . Arthritis   . Chronic diastolic heart failure ([place])   . CKD (chronic kidney disease) stage 3, GFR 30-59 ml/min   . Essential hypertension   . GERD (gastroesophageal reflux disease)   . Hemorrhoids   . History of stroke   . Type 2 diabetes mellitus [facility])     Patient Active Problem List   Diagnosis Date Noted  . Acute pulmonary edema (HCC)   . AKI (acute kidney injury) ([place]) 07/25/2019  . Acute respiratory failure with hypercapnia ([place]) 07/24/2019  . Pancreatitis, acute 07/23/2019  . Diabetes mellitus type 2 with complications ([place]) 81/82/9937  . New onset a-fib ([place]) 07/23/2019  . Chronic diastolic CHF (congestive heart  failure) (HCC)/EF > 60 % 07/23/2019  . Shortness of breath 06/19/2019  . Acute diastolic CHF (congestive heart failure) ([place]) 07/08/2017  . Acute respiratory failure with hypoxemia ([place]) 07/08/2017  . Uncontrolled type 2 diabetes mellitus with diabetic nephropathy, with long-term current use of insulin ([place]) 07/08/2017  . CKD (chronic kidney disease), stage IIIb 07/08/2017  . Essential hypertension 07/08/2017  . Hypertension 06/23/2017  . Prolapsed internal hemorrhoids, grade 3 06/25/2016  . Abdominal pain 05/08/2015  . Dysphagia, pharyngoesophageal phase   . Other hemorrhoids   . Diverticulosis of colon without hemorrhage   . GERD (gastroesophageal reflux disease) 02/28/2014  . Constipation 02/28/2014  . Esophageal dysphagia 02/28/2014  . Rectal bleeding 02/28/2014  . OSTEOARTHRITIS, KNEE, RIGHT, SEVERE 12/13/2008  . DEGENERATIVE DISC DISEASE, LUMBOSACRAL SPINE W/RADICULOPATHY 12/13/2008  . BACK PAIN 12/13/2008  . DIABETES 04/13/2007  . FRACTURE, FEMUR, INTERTROCHANTERIC REGION 04/13/2007    Past Surgical History:  Procedure Laterality Date  . BACK SURGERY    . CATARACT EXTRACTION W/PHACO Left 02/28/2013   Procedure: CATARACT EXTRACTION PHACO AND INTRAOCULAR LENS PLACEMENT (IOL) CDE=15.60;  Surgeon: Elta Guadeloupe T. Gershon Crane, MD;  Location: [facility] ORS;  Service: Ophthalmology;  Laterality: Left;  . CHOLECYSTECTOMY    . COLONOSCOPY  12/12/2003   RMR: Normal rectum.  Long capacious tortuous colon, colonic mucosa appeared normal  . COLONOSCOPY N/A 03/21/2014   Dr. [name]: internal and external hemorrhoids, torturous colon,  colonic diverticulosis   . ESOPHAGOGASTRODUODENOSCOPY  12/28/2001   ZOX:WRUEA sliding hiatal hernia/. Three small bulbar ulcers, two with stigmata of bleeding and these were coagulated using heater probe unit   . ESOPHAGOGASTRODUODENOSCOPY N/A 03/21/2014   Dr. [name]: cervical esophageal web s/p dilation, negative H.pylori   . EYE SURGERY    . HIP FRACTURE SURGERY  2008  .  JOINT REPLACEMENT     Rt hip, ????? sounds like just for fracture  . MALONEY DILATION N/A 03/21/2014   Procedure: [name] DILATION;  Surgeon: [name], MD;  Location: [facility] ENDO SUITE;  Service: Endoscopy;  Laterality: N/A;  . SAVORY DILATION N/A 03/21/2014   Procedure: SAVORY DILATION;  Surgeon: [name], MD;  Location: [facility] ENDO SUITE;  Service: Endoscopy;  Laterality: N/A;     OB History   No obstetric history on file.     Family History  Problem Relation Age of Onset  . Crohn's disease Daughter   . Hypertension Mother   . Hypertension Sister   . Diabetes Brother   . Diabetes Sister   . Diabetes Sister   . Heart attack Sister   . Colon cancer Neg Hx     Social History   Tobacco Use  . Smoking status: Never Smoker  . Smokeless tobacco: Never Used  Vaping Use  . Vaping Use: Never used  Substance Use Topics  . Alcohol use: No  . Drug use: No    Home Medications Prior to Admission medications   Medication Sig Start Date End Date Taking? Authorizing Provider  acetaminophen (TYLENOL) 325 MG tablet Take 2 tablets (650 mg total) by mouth every 6 (six) hours as needed for mild pain, fever or headache (or Fever >/= 101). 07/31/19   Roxan Hockey, MD  apixaban (ELIQUIS) 2.5 MG TABS tablet Take 1 tablet (2.5 mg total) by mouth 2 (two) times daily. 07/31/19   Roxan Hockey, MD  diltiazem (CARDIZEM CD) 120 MG 24 hr capsule Take 1 capsule (120 mg total) by mouth daily. 08/01/19   Roxan Hockey, MD  gabapentin (NEURONTIN) 300 MG capsule Take 300 mg by mouth 2 (two) times daily.  02/22/14   [provider]  hydrALAZINE (APRESOLINE) 50 MG tablet Take 1.5 tablets (75 mg total) by mouth 2 (two) times daily. 06/23/19 10/08/20  Herminio Commons, MD  hydrocortisone (ANUSOL-HC) 2.5 % rectal cream Place 1 application rectally 2 (two) times daily. 06/19/19   Erenest Rasher, PA-C  insulin aspart (NOVOLOG) 100 UNIT/ML FlexPen Inject 0-10 Units into the skin 3 (three) times  daily with meals. insulin aspart (novoLOG) injection 0-10 Units 0-10 Units Subcutaneous, 3 times daily with meals CBG < 70: Implement Hypoglycemia Standing Orders and refer to Hypoglycemia Standing Orders sidebar report  CBG 70 - 120: 0 unit CBG 121 - 150: 0 unit  CBG 151 - 200: 1 unit CBG 201 - 250: 2 units CBG 251 - 300: 4 units CBG 301 - 350: 6 units  CBG 351 - 400: 8 units  CBG > 400: 10 units 07/31/19   Emokpae, Courage, MD  insulin aspart (NOVOLOG) 100 UNIT/ML injection Inject 10 Units into the skin 3 (three) times daily before meals. Patient taking differently: Inject 30 Units into the skin 3 (three) times daily before meals.  07/31/19   Roxan Hockey, MD  insulin detemir (LEVEMIR) 100 UNIT/ML injection Inject 0.7 mLs (70 Units total) into the skin at bedtime. 07/31/19   Roxan Hockey, MD  isosorbide mononitrate (IMDUR) 30 MG 24  hr tablet Take 1 tablet (30 mg total) by mouth daily. 07/31/19 07/30/20  Roxan Hockey, MD  simvastatin (ZOCOR) 20 MG tablet Take 20 mg by mouth daily.    [provider]  torsemide (DEMADEX) 20 MG tablet Take 1 tablet (20 mg total) by mouth 2 (two) times daily. 07/31/19   Roxan Hockey, MD    Allergies    Patient has no known allergies.  Review of Systems   Review of Systems  Constitutional: Negative for fever.  HENT: Negative for sore throat.   Eyes: Negative for visual disturbance.  Respiratory: Negative for cough and shortness of breath.   Cardiovascular: Positive for leg swelling. Negative for chest pain.  Gastrointestinal: Positive for abdominal pain, nausea and vomiting. Negative for diarrhea, hematemesis and hematochezia.  Genitourinary: Negative for dysuria and hematuria.  Musculoskeletal: Negative for neck pain.  Skin: Negative for rash.  Neurological: Negative for headaches.    Physical Exam Updated Vital Signs BP (!) 221/78 (BP Location: Left Arm)   Pulse 87   Temp 98.6 F (37 C) (Oral)   Resp (!) 22   Ht 5\' 6"  (1.676 m)    Wt 103.4 kg   SpO2 95%   BMI 36.80 kg/m   Physical Exam Vitals and nursing note reviewed.  Constitutional:      General: She is not in acute distress.    Appearance: Normal appearance. She is well-developed.  HENT:     Head: Normocephalic and atraumatic.  Eyes:     Conjunctiva/sclera: Conjunctivae normal.  Cardiovascular:     Rate and Rhythm: Normal rate and regular rhythm.     Heart sounds: No murmur heard.   Pulmonary:     Effort: Pulmonary effort is normal. No respiratory distress.     Breath sounds: Normal breath sounds.  Abdominal:     Palpations: Abdomen is soft.     Tenderness: There is abdominal tenderness in the epigastric area. There is no guarding or rebound.  Musculoskeletal:        General: No tenderness.     Cervical back: Neck supple.     Right lower leg: Edema present.     Left lower leg: Edema present.  Skin:    General: Skin is warm and dry.     Capillary Refill: Capillary refill takes less than 2 seconds.  Neurological:     General: No focal deficit present.     Mental Status: She is alert.     ED Results / Procedures / Treatments   Labs (all labs ordered are listed, but only abnormal results are displayed) Labs Reviewed  COMPREHENSIVE METABOLIC PANEL - Abnormal; Notable for the following components:      Result Value   Glucose, Bld 345 (*)    BUN 24 (*)    Creatinine, Ser 1.31 (*)    Calcium 8.7 (*)    GFR calc non Af Amer 38 (*)    GFR calc Af Amer 44 (*)    All other components within normal limits  LIPASE, BLOOD - Abnormal; Notable for the following components:   Lipase 1,444 (*)    All other components within normal limits  CBC WITH DIFFERENTIAL/PLATELET - Abnormal; Notable for the following components:   WBC 13.2 (*)    RDW 16.1 (*)    Neutro Abs 10.8 (*)    All other components within normal limits  BRAIN NATRIURETIC PEPTIDE - Abnormal; Notable for the following components:   B Natriuretic Peptide 116.0 (*)    All  other components  within normal limits  URINALYSIS, ROUTINE W REFLEX MICROSCOPIC - Abnormal; Notable for the following components:   Color, Urine STRAW (*)    Glucose, UA >=500 (*)    Hgb urine dipstick SMALL (*)    Protein, ur 100 (*)    Bacteria, UA RARE (*)    All other components within normal limits  TROPONIN I (HIGH SENSITIVITY) - Abnormal; Notable for the following components:   Troponin I (High Sensitivity) 18 (*)    All other components within normal limits  TROPONIN I (HIGH SENSITIVITY) - Abnormal; Notable for the following components:   Troponin I (High Sensitivity) 20 (*)    All other components within normal limits  SARS CORONAVIRUS 2 BY RT PCR (HOSPITAL ORDER, [facility])  LIPID PANEL  COMPREHENSIVE METABOLIC PANEL  CBC    EKG EKG Interpretation  Date/Time:  Tuesday December 26 2019 08:04:41 EDT Ventricular Rate:  84 PR Interval:  196 QRS Duration: 138 QT Interval:  430 QTC Calculation: 508 R Axis:   172 Text Interpretation: Normal sinus rhythm Right axis deviation Non-specific intra-ventricular conduction block Cannot rule out Anterior infarct , age undetermined Abnormal ECG ? lead reversal, will repeat Confirmed by Aletta Edouard [phone]) on 12/26/2019 8:11:31 AM   Radiology CT Abdomen Pelvis W Contrast  Result Date: 12/26/2019 CLINICAL DATA:  PT brought in by [facility] from home c/o epigastric nagging pain with nausea that started yesterday evening. PT states she had some vomiting last night and no diarrhea. EXAM: CT ABDOMEN AND PELVIS WITH CONTRAST TECHNIQUE: Multidetector CT imaging of the abdomen and pelvis was performed using the standard protocol following bolus administration of intravenous contrast. CONTRAST:  64mL OMNIPAQUE IOHEXOL 300 MG/ML  SOLN COMPARISON:  07/23/2019 FINDINGS: Lower chest: No acute abnormality. Hepatobiliary: No focal liver abnormality is seen. Status post cholecystectomy. No biliary dilatation. Pancreas: There is peripancreatic  inflammation reflected by stranding and hazy increased attenuation in the peripancreatic fat that extends along the second and third portion of the duodenum and gastrohepatic ligament as well as the root of the small bowel mesentery. Mild inflammation is noted along the right anterior pararenal fascia. Pancreas shows homogeneous enhancement. There is irregular duct dilation, maximum 6 mm. No pancreatic mass. No collection to suggest a pseudocyst or abscess. Spleen: Normal in size without focal abnormality. Adrenals/Urinary Tract: No adrenal masses. Bilateral renal cortical thinning. Small low-density renal lesions are noted, largest anterior upper pole of the left kidney, 11 mm, consistent with cysts, and without change from the prior CT. Small nonobstructing stone in the upper pole the left kidney. Bilateral vascular calcifications. No hydronephrosis. Normal ureters. Normal bladder. Stomach/Bowel: Normal stomach. Small bowel and colon are normal in caliber. No wall thickening. No inflammation. Normal appendix visualized. Vascular/Lymphatic: Patent portal vein, splenic vein and superior mesenteric vein. Aortic and branch vessel atherosclerosis. No aneurysm. There are subcentimeter peripancreatic and gastrohepatic ligament lymph nodes. Reproductive: Normal uterus. Bilateral tubal ligation clips, stable. No adnexal masses. Other: No ascites. There is increased attenuation in the subcutaneous fat of the lower abdomen similar to the prior CT. Small fat containing umbilical hernia. Musculoskeletal: No fracture or acute finding. No osteoblastic or osteolytic lesions. Previous right proximal femur ORIF with a compression screw. Chronic appearing avascular necrosis along the superior right femoral head. IMPRESSION: 1. Acute uncomplicated pancreatitis. No evidence of pancreatic necrosis or of an abscess or pseudocyst. No venous thrombosis. 2. No other acute abnormality within the abdomen or pelvis. 3. Aortic atherosclerosis.  Electronically Signed   By: Lajean Manes M.D.   On: 12/26/2019 13:15   DG Chest Port 1 View  Result Date: 12/26/2019 CLINICAL DATA:  Epigastric abdominal pain and vomiting. EXAM: PORTABLE CHEST 1 VIEW COMPARISON:  07/31/2019 FINDINGS: The heart is enlarged but stable. Stable tortuosity of the thoracic aorta. Stable eventration of the left hemidiaphragm with some overlying vascular crowding and atelectasis. No infiltrates or effusions. IMPRESSION: 1. Stable cardiac enlargement. 2. No acute pulmonary findings. Electronically Signed   By: Marijo Sanes M.D.   On: 12/26/2019 09:45    Procedures Procedures (including critical care time)  Medications Ordered in ED Medications  dextrose 5 % and 0.45 % NaCl with KCl 20 mEq/L infusion (0 mLs Intravenous Paused 12/26/19 1525)  labetalol (NORMODYNE) injection 10 mg (has no administration in time range)  gabapentin (NEURONTIN) capsule 300 mg (has no administration in time range)  hydrALAZINE (APRESOLINE) tablet 75 mg (75 mg Oral Not Given 12/26/19 1524)  diltiazem (CARDIZEM CD) 24 hr capsule 120 mg (120 mg Oral Given 12/26/19 1523)  acetaminophen (TYLENOL) tablet 650 mg (has no administration in time range)  apixaban (ELIQUIS) tablet 2.5 mg (2.5 mg Oral Not Given 12/26/19 1524)  insulin detemir (LEVEMIR) injection 40 Units (has no administration in time range)  isosorbide mononitrate (IMDUR) 24 hr tablet 30 mg (30 mg Oral Given 12/26/19 1523)  sodium chloride flush (NS) 0.9 % injection 3 mL (3 mLs Intravenous Not Given 12/26/19 1450)  sodium chloride flush (NS) 0.9 % injection 3 mL (has no administration in time range)  0.9 %  sodium chloride infusion (has no administration in time range)  traZODone (DESYREL) tablet 50 mg (has no administration in time range)  polyethylene glycol (MIRALAX / GLYCOLAX) packet 17 g (has no administration in time range)  ondansetron (ZOFRAN) tablet 4 mg ( Oral See Alternative 12/26/19 1523)    Or  ondansetron (ZOFRAN)  injection 4 mg (4 mg Intravenous Given 12/26/19 1523)  albuterol (PROVENTIL) (2.5 MG/3ML) 0.083% nebulizer solution 2.5 mg (has no administration in time range)  oxyCODONE (Oxy IR/ROXICODONE) immediate release tablet 5 mg (5 mg Oral Given 12/26/19 1523)  HYDROmorphone (DILAUDID) injection 0.5-1 mg (has no administration in time range)  famotidine (PEPCID) IVPB 20 mg premix (0 mg Intravenous Stopped 12/26/19 1552)  morphine 4 MG/ML injection 4 mg (4 mg Intravenous Given 12/26/19 0804)  ondansetron (ZOFRAN) injection 4 mg (4 mg Intravenous Given 12/26/19 0804)  sodium chloride 0.9 % bolus 1,000 mL (0 mLs Intravenous Stopped 12/26/19 1300)  iohexol (OMNIPAQUE) 300 MG/ML solution 75 mL (75 mLs Intravenous Contrast Given 12/26/19 1249)  sodium chloride 0.9 % bolus 1,000 mL (1,000 mLs Intravenous New Bag/Given 12/26/19 1522)  labetalol (NORMODYNE) injection 20 mg (20 mg Intravenous Given 12/26/19 1523)    ED Course  I have reviewed the triage vital signs and the nursing notes.  Pertinent labs & imaging results that were available during my care of the patient were reviewed by me and considered in my medical decision making (see chart for details).  Clinical Course as of Dec 26 1754  Tue Dec 26, 2019  0747 BP(!): 221/78 [AS]  0935 Patient's labs coming back with a markedly elevated lipase.  She was admitted back in March for same.  She denies any alcohol use.   [MB]  [id number] After morphine and Zofran she says her symptoms are improved.   [MB]  [id number] Troponin I (High Sensitivity)(!): 18 [AS]  0959 Glucose(!): 345 [AS]  1108 Discussed with Dr.  [facility] hospitalist who accepts the patient for admission.  He asked if we can get a CT abdomen for evaluation of her pancreas.   [MB]    Clinical Course User Index [AS] [place], Amelia, Student-PA [MB] Hayden Rasmussen, MD   MDM Rules/Calculators/A&P                         This patient complains of abdominal pain nausea vomiting; this involves an extensive  number of treatment Options and is a complaint that carries with it a high risk of complications and Morbidity. The differential includes peptic ulcer disease, gastritis, obstruction, pancreatitis, cholecystitis, ACS, metabolic derangement, colitis  I ordered, reviewed and interpreted labs, which included CBC with mildly elevated white count stable hemoglobin, chemistries with elevated glucose elevated BUN and creatinine, troponin elevated but not rising, BNP mildly elevated, lipase markedly elevated at 1400  I ordered medication IV pain medicine nausea medicine and fluids with improvement in her symptoms I ordered imaging studies which included CT abdomen and pelvis and I independently    visualized and interpreted imaging which showed uncomplicated pancreatitis Previous records obtained and reviewed in epic, was admitted in March for pancreatitis I consulted [facility] hospitalist Dr. Denton Brick and discussed lab and imaging findings  Critical Interventions: None  After the interventions stated above, I reevaluated the patient and found patient symptoms to be improved.  Blood pressure still elevated although not as bad.  Will need admission to the hospital for further treatment of her acute pancreatitis.  Patient in agreement with plan.   Final Clinical Impression(s) / ED Diagnoses Final diagnoses:  Acute pancreatitis without infection or necrosis, unspecified pancreatitis type  Essential hypertension    Rx / DC Orders ED Discharge Orders    None       Hayden Rasmussen, MD 12/26/19 1759

## 2019-12-26 NOTE — H&P (Signed)
Patient Demographics:    [name], is a 85 y.o. female  MRN: [id number]   DOB - [birth year]  Admit Date - 12/26/2019  Outpatient Primary MD for the patient is [name], MD   Assessment & Plan:    Active Problems:   Pancreatitis    1)Acute Pancreatitis--- IV fluids, bowel rest, Pepcid, as needed antiemetics and opiates -CT abdomen pelvis finding noted -Recent lipid profile without significant triglyceridemia -Please note the patient was admitted for same back in March 2021 -Patient is status post prior cholecystectomy  2)PAFib--- patient with paroxysmal atrial fibrillation,CHADSVASc of 8. She is currently on oral diltiazem CD 120 mg for rate control. -continue Eliquis 2.5 mg twice daily for stroke prophylaxis  -Appears back in sinus rhythm -Echo from 07/08/2019 with EF of 60 to 65% -Nuclear stress test from 07/18/2019 without reversible ischemia  3) chronic diastolic congestive heart failure-right-sided heart failure symptoms noted--- watch volume status closely with IV fluids for pancreatitis -Last known EF 60 to 65% -Chronic lower extremity edema, appears currently compensated  4)CKD stage - IIIb--- renal function appears to be stable at this time -- renally adjust medications, avoid nephrotoxic agents / dehydration / hypotension  5)Diabetes--- PTA patient was chronically on high-dose Levemir.  ---Use Novolog/Humalog Sliding scale insulin with Accu-Cheks/Fingersticks as ordered    6)HTN--- continue Cardizem CD 120 mg, resume home dose of hydralazine at 75 mg twice daily, isosorbide 30 mg daily added   7)Abnormal imaging findings. Imaging findings suggest thyroid nodule and possible ---outpatient follow-up with PCP for further investigation/work-up strongly advised -Outpatient biopsy  scheduled by Loni Beckwith patient's endocrinologist  8)Morbid obesity/possible OSA-- BMI was>40.   After aggressive diuresis BMI is now 37.5 -Given documented history of hypercapnia patient needs outpatient sleep study  9) chronic anemia--- hemoglobin currently at 12.4 stable    DVT prophylaxis:Eliquis Code Status:Full code   Disposition/Need for in-Hospital Stay- patient unable to be discharged at this time due to --- acute pancreatitis requiring n.p.o. status and IV fluids, as well as IV antiemetics and IV opiates*  Status is: Inpatient   Dispo: The patient is from: Home              Anticipated d/c is to: Home              Anticipated d/c date is: 2 days              Patient currently is not medically stable to d/c. Barriers: Not Clinically Stable-  acute pancreatitis requiring n.p.o. status and IV fluids, as well as IV antiemetics and IV opiates*  With History of - Reviewed by me  Past Medical History:  Diagnosis Date  . Arthritis   . Chronic diastolic heart failure ([place])   . CKD (chronic kidney disease) stage 3, GFR 30-59 ml/min   . Essential hypertension   . GERD (gastroesophageal reflux disease)   . Hemorrhoids   . History of stroke   .  Type 2 diabetes mellitus ([place])       Past Surgical History:  Procedure Laterality Date  . BACK SURGERY    . CATARACT EXTRACTION W/PHACO Left 02/28/2013   Procedure: CATARACT EXTRACTION PHACO AND INTRAOCULAR LENS PLACEMENT (IOL) CDE=15.60;  Surgeon: Elta Guadeloupe T. Gershon Crane, MD;  Location: [facility];  Service: Ophthalmology;  Laterality: Left;  . CHOLECYSTECTOMY    . COLONOSCOPY  12/12/2003   RMR: Normal rectum.  Long capacious tortuous colon, colonic mucosa appeared normal  . COLONOSCOPY N/A 03/21/2014   Dr. [name]: internal and external hemorrhoids, torturous colon, colonic diverticulosis   . ESOPHAGOGASTRODUODENOSCOPY  12/28/2001   UVO:ZDGUY sliding hiatal hernia/. Three small bulbar ulcers, two with stigmata of bleeding  and these were coagulated using heater probe unit   . ESOPHAGOGASTRODUODENOSCOPY N/A 03/21/2014   Dr. [name]: cervical esophageal web s/p dilation, negative H.pylori   . EYE SURGERY    . HIP FRACTURE SURGERY  2008  . JOINT REPLACEMENT     Rt hip, ????? sounds like just for fracture  . MALONEY DILATION N/A 03/21/2014   Procedure: [name] DILATION;  Surgeon: [name], MD;  Location: [facility];  Service: Endoscopy;  Laterality: N/A;  . SAVORY DILATION N/A 03/21/2014   Procedure: SAVORY DILATION;  Surgeon: [name], MD;  Location: [facility];  Service: Endoscopy;  Laterality: N/A;      Chief Complaint  Patient presents with  . Abdominal Pain      HPI:    [name]  is a 85 y.o. female with past medical history relevant for HTN, DM, CKD 3B, dCHF , PAFib, chronic LBB and chronic lower extremity edema who presents to the ED with epigastric pain associated with nausea and vomiting -Emesis is without blood or bile -Patient denies diarrhea, no fevers no chills Denies dyspnea,-She does have chronic lower extremity edema  -In ED patient found to have findings of acute pancreatitis  -CT abdomen and pelvis consistent with acute pancreatitis, lipase is found to be 1,444, LFTs are not elevated -WBC is 13.2 with hemoglobin of 12.4 -Creatinine is 1.31 which is actually better than recent baseline,  -UA appears colonized, with glucosuria and proteinuria -Chest x-ray without acute findings -No significant elevation of troponin, BNP 116   Review of systems:    In addition to the HPI above,   A full Review of  Systems was done, all other systems reviewed are negative except as noted above in HPI , .    Social History:  Reviewed by me    Social History   Tobacco Use  . Smoking status: Never Smoker  . Smokeless tobacco: Never Used  Substance Use Topics  . Alcohol use: No       Family History :  Reviewed by me    Family History  Problem Relation Age of Onset  .  Crohn's disease Daughter   . Hypertension Mother   . Hypertension Sister   . Diabetes Brother   . Diabetes Sister   . Diabetes Sister   . Heart attack Sister   . Colon cancer Neg Hx      Home Medications:   Prior to Admission medications   Medication Sig Start Date End Date Taking? Authorizing Provider  acetaminophen (TYLENOL) 325 MG tablet Take 2 tablets (650 mg total) by mouth every 6 (six) hours as needed for mild pain, fever or headache (or Fever >/= 101). 07/31/19  Yes Aleksa Catterton, MD  apixaban (ELIQUIS) 2.5 MG TABS tablet Take 1  tablet (2.5 mg total) by mouth 2 (two) times daily. 07/31/19  Yes Jaydrien Wassenaar, MD  diltiazem (CARDIZEM CD) 120 MG 24 hr capsule Take 1 capsule (120 mg total) by mouth daily. 08/01/19  Yes Brandley Aldrete, MD  gabapentin (NEURONTIN) 300 MG capsule Take 300 mg by mouth 2 (two) times daily.  02/22/14  Yes [provider]  hydrALAZINE (APRESOLINE) 50 MG tablet Take 1.5 tablets (75 mg total) by mouth 2 (two) times daily. 06/23/19 10/08/20 Yes Herminio Commons, MD  insulin aspart (NOVOLOG) 100 UNIT/ML FlexPen Inject 0-10 Units into the skin 3 (three) times daily with meals. insulin aspart (novoLOG) injection 0-10 Units 0-10 Units Subcutaneous, 3 times daily with meals CBG < 70: Implement Hypoglycemia Standing Orders and refer to Hypoglycemia Standing Orders sidebar report  CBG 70 - 120: 0 unit CBG 121 - 150: 0 unit  CBG 151 - 200: 1 unit CBG 201 - 250: 2 units CBG 251 - 300: 4 units CBG 301 - 350: 6 units  CBG 351 - 400: 8 units  CBG > 400: 10 units 07/31/19  Yes Jailen Coward, MD  insulin detemir (LEVEMIR) 100 UNIT/ML injection Inject 0.7 mLs (70 Units total) into the skin at bedtime. Patient taking differently: Inject 80 Units into the skin at bedtime.  07/31/19  Yes Kathlynn Swofford, MD  isosorbide mononitrate (IMDUR) 30 MG 24 hr tablet Take 1 tablet (30 mg total) by mouth daily. 07/31/19 07/30/20 Yes Emilyanne Mcgough, MD  simvastatin (ZOCOR) 20 MG  tablet Take 20 mg by mouth daily.   Yes [provider]  torsemide (DEMADEX) 20 MG tablet Take 1 tablet (20 mg total) by mouth 2 (two) times daily. 07/31/19  Yes Johnisha Louks, MD  hydrocortisone (ANUSOL-HC) 2.5 % rectal cream Place 1 application rectally 2 (two) times daily. Patient not taking: Reported on 12/26/2019 06/19/19   Erenest Rasher, PA-C  insulin aspart (NOVOLOG) 100 UNIT/ML injection Inject 10 Units into the skin 3 (three) times daily before meals. Patient not taking: Reported on 12/26/2019 07/31/19   Roxan Hockey, MD     Allergies:    No Known Allergies   Physical Exam:   Vitals  Blood pressure (!) 188/62, pulse 78, temperature 98.6 F (37 C), temperature source Oral, resp. rate 19, height 5\' 6"  (1.676 m), weight 103.4 kg, SpO2 93 %.  Physical Examination: General appearance - alert, , and in no distress Mental status - alert, oriented to person, place, and time,  Eyes - sclera anicteric Neck - supple, no JVD elevation , Chest - clear  to auscultation bilaterally, symmetrical air movement,  Heart - S1 and S2 normal, regular  Abdomen - soft, tender periumbilical left upper quadrant area, no rebound, bowel sounds present Neurological - screening mental status exam normal, neck supple without rigidity, cranial nerves II through XII intact, DTR's normal and symmetric Extremities -chronic pedal edema noted, intact peripheral pulses  Skin - warm, dry     Data Review:    CBC Recent Labs  Lab 12/26/19 0818  WBC 13.2*  HGB 12.4  HCT 39.9  PLT 263  MCV 89.5  MCH 27.8  MCHC 31.1  RDW 16.1*  LYMPHSABS 1.8  MONOABS 0.6  EOSABS 0.1  BASOSABS 0.0   ------------------------------------------------------------------------------------------------------------------  Chemistries  Recent Labs  Lab 12/26/19 0818  NA 139  K 4.0  CL 103  CO2 25  GLUCOSE 345*  BUN 24*  CREATININE 1.31*  CALCIUM 8.7*  AST 18  ALT 16  ALKPHOS 63  BILITOT 0.5    ------------------------------------------------------------------------------------------------------------------ estimated creatinine clearance is 39.5 mL/min (A) (by C-G formula based on SCr of 1.31 mg/dL (H)). ------------------------------------------------------------------------------------------------------------------ No results for input(s): TSH, T4TOTAL, T3FREE, THYROIDAB in the last 72 hours.  Invalid input(s): FREET3   Coagulation profile No results for input(s): INR, PROTIME in the last 168 hours. ------------------------------------------------------------------------------------------------------------------- No results for input(s): DDIMER in the last 72 hours. -------------------------------------------------------------------------------------------------------------------  Cardiac Enzymes No results for input(s): CKMB, TROPONINI, MYOGLOBIN in the last 168 hours.  Invalid input(s): CK ------------------------------------------------------------------------------------------------------------------    Component Value Date/Time   BNP 116.0 (H) 12/26/2019 0818     ---------------------------------------------------------------------------------------------------------------  Urinalysis    Component Value Date/Time   COLORURINE STRAW (A) 12/26/2019 0724   APPEARANCEUR CLEAR 12/26/2019 0724   LABSPEC 1.010 12/26/2019 0724   PHURINE 7.0 12/26/2019 0724   GLUCOSEU >=500 (A) 12/26/2019 0724   HGBUR SMALL (A) 12/26/2019 0724   BILIRUBINUR NEGATIVE 12/26/2019 0724   KETONESUR NEGATIVE 12/26/2019 0724   PROTEINUR 100 (A) 12/26/2019 0724   UROBILINOGEN 0.2 02/20/2015 1200   NITRITE NEGATIVE 12/26/2019 0724   LEUKOCYTESUR NEGATIVE 12/26/2019 0724    ----------------------------------------------------------------------------------------------------------------   Imaging Results:    CT Abdomen Pelvis W Contrast  Result Date: 12/26/2019 CLINICAL DATA:  PT  brought in by [facility] from home c/o epigastric nagging pain with nausea that started yesterday evening. PT states she had some vomiting last night and no diarrhea. EXAM: CT ABDOMEN AND PELVIS WITH CONTRAST TECHNIQUE: Multidetector CT imaging of the abdomen and pelvis was performed using the standard protocol following bolus administration of intravenous contrast. CONTRAST:  20mL OMNIPAQUE IOHEXOL 300 MG/ML  SOLN COMPARISON:  07/23/2019 FINDINGS: Lower chest: No acute abnormality. Hepatobiliary: No focal liver abnormality is seen. Status post cholecystectomy. No biliary dilatation. Pancreas: There is peripancreatic inflammation reflected by stranding and hazy increased attenuation in the peripancreatic fat that extends along the second and third portion of the duodenum and gastrohepatic ligament as well as the root of the small bowel mesentery. Mild inflammation is noted along the right anterior pararenal fascia. Pancreas shows homogeneous enhancement. There is irregular duct dilation, maximum 6 mm. No pancreatic mass. No collection to suggest a pseudocyst or abscess. Spleen: Normal in size without focal abnormality. Adrenals/Urinary Tract: No adrenal masses. Bilateral renal cortical thinning. Small low-density renal lesions are noted, largest anterior upper pole of the left kidney, 11 mm, consistent with cysts, and without change from the prior CT. Small nonobstructing stone in the upper pole the left kidney. Bilateral vascular calcifications. No hydronephrosis. Normal ureters. Normal bladder. Stomach/Bowel: Normal stomach. Small bowel and colon are normal in caliber. No wall thickening. No inflammation. Normal appendix visualized. Vascular/Lymphatic: Patent portal vein, splenic vein and superior mesenteric vein. Aortic and branch vessel atherosclerosis. No aneurysm. There are subcentimeter peripancreatic and gastrohepatic ligament lymph nodes. Reproductive: Normal uterus. Bilateral tubal ligation clips, stable. No  adnexal masses. Other: No ascites. There is increased attenuation in the subcutaneous fat of the lower abdomen similar to the prior CT. Small fat containing umbilical hernia. Musculoskeletal: No fracture or acute finding. No osteoblastic or osteolytic lesions. Previous right proximal femur ORIF with a compression screw. Chronic appearing avascular necrosis along the superior right femoral head. IMPRESSION: 1. Acute uncomplicated pancreatitis. No evidence of pancreatic necrosis or of an abscess or pseudocyst. No venous thrombosis. 2. No other acute abnormality within the abdomen or pelvis. 3. Aortic atherosclerosis. Electronically Signed   By: Lajean Manes M.D.   On: 12/26/2019 13:15   DG Chest [facility]  Result Date: 12/26/2019 CLINICAL DATA:  Epigastric abdominal pain and vomiting. EXAM: PORTABLE CHEST 1 VIEW COMPARISON:  07/31/2019 FINDINGS: The heart is enlarged but stable. Stable tortuosity of the thoracic aorta. Stable eventration of the left hemidiaphragm with some overlying vascular crowding and atelectasis. No infiltrates or effusions. IMPRESSION: 1. Stable cardiac enlargement. 2. No acute pulmonary findings. Electronically Signed   By: Marijo Sanes M.D.   On: 12/26/2019 09:45    Radiological Exams on Admission: CT Abdomen Pelvis W Contrast  Result Date: 12/26/2019 CLINICAL DATA:  PT brought in by [facility] from home c/o epigastric nagging pain with nausea that started yesterday evening. PT states she had some vomiting last night and no diarrhea. EXAM: CT ABDOMEN AND PELVIS WITH CONTRAST TECHNIQUE: Multidetector CT imaging of the abdomen and pelvis was performed using the standard protocol following bolus administration of intravenous contrast. CONTRAST:  20mL OMNIPAQUE IOHEXOL 300 MG/ML  SOLN COMPARISON:  07/23/2019 FINDINGS: Lower chest: No acute abnormality. Hepatobiliary: No focal liver abnormality is seen. Status post cholecystectomy. No biliary dilatation. Pancreas: There is peripancreatic  inflammation reflected by stranding and hazy increased attenuation in the peripancreatic fat that extends along the second and third portion of the duodenum and gastrohepatic ligament as well as the root of the small bowel mesentery. Mild inflammation is noted along the right anterior pararenal fascia. Pancreas shows homogeneous enhancement. There is irregular duct dilation, maximum 6 mm. No pancreatic mass. No collection to suggest a pseudocyst or abscess. Spleen: Normal in size without focal abnormality. Adrenals/Urinary Tract: No adrenal masses. Bilateral renal cortical thinning. Small low-density renal lesions are noted, largest anterior upper pole of the left kidney, 11 mm, consistent with cysts, and without change from the prior CT. Small nonobstructing stone in the upper pole the left kidney. Bilateral vascular calcifications. No hydronephrosis. Normal ureters. Normal bladder. Stomach/Bowel: Normal stomach. Small bowel and colon are normal in caliber. No wall thickening. No inflammation. Normal appendix visualized. Vascular/Lymphatic: Patent portal vein, splenic vein and superior mesenteric vein. Aortic and branch vessel atherosclerosis. No aneurysm. There are subcentimeter peripancreatic and gastrohepatic ligament lymph nodes. Reproductive: Normal uterus. Bilateral tubal ligation clips, stable. No adnexal masses. Other: No ascites. There is increased attenuation in the subcutaneous fat of the lower abdomen similar to the prior CT. Small fat containing umbilical hernia. Musculoskeletal: No fracture or acute finding. No osteoblastic or osteolytic lesions. Previous right proximal femur ORIF with a compression screw. Chronic appearing avascular necrosis along the superior right femoral head. IMPRESSION: 1. Acute uncomplicated pancreatitis. No evidence of pancreatic necrosis or of an abscess or pseudocyst. No venous thrombosis. 2. No other acute abnormality within the abdomen or pelvis. 3. Aortic atherosclerosis.  Electronically Signed   By: Lajean Manes M.D.   On: 12/26/2019 13:15   DG Chest Port 1 View  Result Date: 12/26/2019 CLINICAL DATA:  Epigastric abdominal pain and vomiting. EXAM: PORTABLE CHEST 1 VIEW COMPARISON:  07/31/2019 FINDINGS: The heart is enlarged but stable. Stable tortuosity of the thoracic aorta. Stable eventration of the left hemidiaphragm with some overlying vascular crowding and atelectasis. No infiltrates or effusions. IMPRESSION: 1. Stable cardiac enlargement. 2. No acute pulmonary findings. Electronically Signed   By: Marijo Sanes M.D.   On: 12/26/2019 09:45    DVT Prophylaxis -SCD  /eliquis AM Labs Ordered, also please review Full Orders  Family Communication: Admission, patients condition and plan of care including tests being ordered have been discussed with the patient  who indicate understanding and agree with the plan  Code Status - Full Code  Likely DC to  Home when tolerating oral intake  Condition   stable  Roxan Hockey M.D on 12/26/2019 at 7:01 PM Go to [email or web address] -  for contact info  [facility] - Office  [phone]

## 2019-12-26 NOTE — ED Notes (Signed)
EDP at bedside  

## 2019-12-27 LAB — CBC
HCT: 33.8 % — ABNORMAL LOW (ref 36.0–46.0)
Hemoglobin: 10.5 g/dL — ABNORMAL LOW (ref 12.0–15.0)
MCH: 28.2 pg (ref 26.0–34.0)
MCHC: 31.1 g/dL (ref 30.0–36.0)
MCV: 90.9 fL (ref 80.0–100.0)
Platelets: 228 10*3/uL (ref 150–400)
RBC: 3.72 MIL/uL — ABNORMAL LOW (ref 3.87–5.11)
RDW: 16.8 % — ABNORMAL HIGH (ref 11.5–15.5)
WBC: 14.5 10*3/uL — ABNORMAL HIGH (ref 4.0–10.5)
nRBC: 0 % (ref 0.0–0.2)

## 2019-12-27 LAB — LIPID PANEL
Cholesterol: 118 mg/dL (ref 0–200)
HDL: 33 mg/dL — ABNORMAL LOW (ref >40)
LDL Cholesterol: 75 mg/dL (ref 0–99)
Total CHOL/HDL Ratio: 3.6 RATIO
Triglycerides: 52 mg/dL (ref <150)
VLDL: 10 mg/dL (ref 0–40)

## 2019-12-27 LAB — COMPREHENSIVE METABOLIC PANEL
ALT: 12 U/L (ref 0–44)
AST: 12 U/L — ABNORMAL LOW (ref 15–41)
Albumin: 2.9 g/dL — ABNORMAL LOW (ref 3.5–5.0)
Alkaline Phosphatase: 51 U/L (ref 38–126)
Anion gap: 9 (ref 5–15)
BUN: 21 mg/dL (ref 8–23)
CO2: 23 mmol/L (ref 22–32)
Calcium: 7.9 mg/dL — ABNORMAL LOW (ref 8.9–10.3)
Chloride: 107 mmol/L (ref 98–111)
Creatinine, Ser: 1.45 mg/dL — ABNORMAL HIGH (ref 0.44–1.00)
GFR calc Af Amer: 39 mL/min — ABNORMAL LOW (ref >60)
GFR calc non Af Amer: 33 mL/min — ABNORMAL LOW (ref >60)
Glucose, Bld: 428 mg/dL — ABNORMAL HIGH (ref 70–99)
Potassium: 4.4 mmol/L (ref 3.5–5.1)
Sodium: 139 mmol/L (ref 135–145)
Total Bilirubin: 0.5 mg/dL (ref 0.3–1.2)
Total Protein: 6.8 g/dL (ref 6.5–8.1)

## 2019-12-27 LAB — GLUCOSE, CAPILLARY
Glucose-Capillary: 223 mg/dL — ABNORMAL HIGH (ref 70–99)
Glucose-Capillary: 271 mg/dL — ABNORMAL HIGH (ref 70–99)
Glucose-Capillary: 365 mg/dL — ABNORMAL HIGH (ref 70–99)
Glucose-Capillary: 450 mg/dL — ABNORMAL HIGH (ref 70–99)

## 2019-12-27 MED ORDER — INSULIN DETEMIR 100 UNIT/ML ~~LOC~~ SOLN
50.0000 [IU] | Freq: Every day | SUBCUTANEOUS | Status: DC
  Administered 2019-12-27: 50 [IU] via SUBCUTANEOUS
  Filled 2019-12-27 (×2): qty 0.5

## 2019-12-27 MED ORDER — SODIUM CHLORIDE 0.9 % IV SOLN: INTRAVENOUS | Status: DC

## 2019-12-27 MED ORDER — INSULIN ASPART 100 UNIT/ML ~~LOC~~ SOLN
0.0000 [IU] | Freq: Three times a day (TID) | SUBCUTANEOUS | Status: DC
  Administered 2019-12-27: 9 [IU] via SUBCUTANEOUS
  Administered 2019-12-28 (×3): 3 [IU] via SUBCUTANEOUS
  Administered 2019-12-29 (×2): 1 [IU] via SUBCUTANEOUS
  Administered 2019-12-29 – 2019-12-30 (×2): 2 [IU] via SUBCUTANEOUS
  Administered 2019-12-30: 3 [IU] via SUBCUTANEOUS
  Administered 2019-12-31: 5 [IU] via SUBCUTANEOUS
  Administered 2019-12-31: 2 [IU] via SUBCUTANEOUS
  Administered 2019-12-31: 7 [IU] via SUBCUTANEOUS
  Administered 2020-01-01 (×2): 2 [IU] via SUBCUTANEOUS
  Administered 2020-01-01 – 2020-01-02 (×3): 3 [IU] via SUBCUTANEOUS
  Administered 2020-01-03: 2 [IU] via SUBCUTANEOUS
  Administered 2020-01-03: 3 [IU] via SUBCUTANEOUS
  Administered 2020-01-03: 2 [IU] via SUBCUTANEOUS

## 2019-12-27 MED ORDER — ONDANSETRON HCL 4 MG/2ML IJ SOLN
4.0000 mg | Freq: Once | INTRAMUSCULAR | Status: AC
  Administered 2019-12-27: 4 mg via INTRAVENOUS

## 2019-12-27 MED ORDER — HYDRALAZINE HCL 25 MG PO TABS
100.0000 mg | ORAL_TABLET | Freq: Two times a day (BID) | ORAL | Status: DC
  Administered 2019-12-27 – 2020-01-01 (×10): 100 mg via ORAL
  Filled 2019-12-27 (×10): qty 4

## 2019-12-27 MED ORDER — INSULIN ASPART 100 UNIT/ML ~~LOC~~ SOLN
16.0000 [IU] | Freq: Once | SUBCUTANEOUS | Status: AC
  Administered 2019-12-27: 16 [IU] via SUBCUTANEOUS

## 2019-12-27 MED ORDER — INSULIN ASPART 100 UNIT/ML ~~LOC~~ SOLN
0.0000 [IU] | Freq: Every day | SUBCUTANEOUS | Status: DC
  Administered 2019-12-27: 3 [IU] via SUBCUTANEOUS
  Administered 2019-12-30: 4 [IU] via SUBCUTANEOUS
  Administered 2019-12-31: 3 [IU] via SUBCUTANEOUS
  Administered 2020-01-01 – 2020-01-02 (×2): 2 [IU] via SUBCUTANEOUS

## 2019-12-27 NOTE — Progress Notes (Signed)
Inpatient Diabetes Program Recommendations  AACE/ADA: New Consensus Statement on Inpatient Glycemic Control  Target Ranges:  Prepandial:   less than 140 mg/dL      Peak postprandial:   less than 180 mg/dL (1-2 hours)      Critically ill patients:  140 - 180 mg/dL  Results for VERALYN, LOPP (MRN [id number]) as of 12/27/2019 13:26  Ref. Range 12/26/2019 08:18 12/27/2019 04:25  Glucose Latest Ref Range: 70 - 99 mg/dL 345 (H) 428 (H)    Review of Glycemic Control  Diabetes history: DM2 Outpatient Diabetes medications: Levemir 80 units QHS, Novolog 0-10 units TID with meals Current orders for Inpatient glycemic control: Levemir 40 units QHS  Inpatient Diabetes Program Recommendations:    IV fluids: Noted D5 1/2 NS with KCL 20 mEq/L @ 150 ml/hr ordered. Please re-evaluate if dextrose is still needed.  Insulin: In reviewing chart, noted patient received Levemir 40 units at 21:20 last night. No finger stick glucose checked or ordered. Lab glucose 428 mg/dl today. Please consider increasing Levemir to 60 units QHS, ordering CBGs Q4H and Novolog 0-15 units Q4H.  Thanks, Barnie Alderman, RN, MSN, CDE Diabetes Coordinator Inpatient Diabetes Program [phone] (Team Pager from 8am to 5pm)

## 2019-12-27 NOTE — Progress Notes (Signed)
Patient Demographics:    [name], is a 85 y.o. female, DOB - Jan 31, 1935, CVE:[id number]  Admit date - 12/26/2019   Admitting Physician Demaurion Dicioccio Denton Brick, MD  Outpatient Primary MD for the patient is [name], MD  LOS - 1   Chief Complaint  Patient presents with  . Abdominal Pain        Subjective:    [name] today has no fevers, ,  No chest pain,   -Patient's daughter at bedside, nausea and abdominal pain persist, no emesis, -Zofran minimally effective at times requiring additional doses  Assessment  & Plan :    Active Problems:   Pancreatitis  Brief Summary:- 85 y.o. female with past medical history relevant for HTN, DM, CKD 3B,dCHF ,PAFib, chronic LBBand chronic lower extremity edema admitted on 12/26/2019 with nausea vomiting the setting of acute pancreatitis similar to her presentation in March 2021  A/p 1)Acute Pancreatitis--- IV fluids, bowel rest, Pepcid, as needed antiemetics and opiates -CT abdomen pelvis consistent with acute pancreatitis -Recent lipid profile without significant triglyceridemia -Please note the patient was admitted for same back in March 2021 -Patient is status post prior cholecystectomy -Review of her medications indicated torsemide, gabapentin and simvastatin could have triggered her pancreatitis   2)PAFib--- patient with paroxysmal atrial fibrillation,CHADSVASc of 8. She is currently on oral diltiazem CD 120 mg for rate control. -continue Eliquis 2.5 mg twice daily for stroke prophylaxis  -Appears back in sinus rhythm -Echo from 07/08/2019 with EF of 60 to 65% -Nuclear stress test from 07/18/2019 without reversible ischemia  3) chronic diastolic congestive heart failure-right-sided heart failure symptoms noted--- watch volume status closely with IV fluids for pancreatitis -Last known EF 60 to 65% -Chronic lower extremity edema, appears currently  compensated  4)CKD stage - IIIb--- renal function appears to be stable at this time -- renally adjust medications, avoid nephrotoxic agents / dehydration / hypotension  5)Diabetes--- PTA patient was chronically on high-dose Levemir.  -Given persistent hypoglycemia will change IV fluids to normal saline from D5, and increase Levemir insulin to 50 units ---Use Novolog/Humalog Sliding scale insulin with Accu-Cheks/Fingersticks as ordered    6)HTN--- BP is not at goal, okay to increase hydralazine 200 mg twice daily, continue Cardizem CD 120 mg,, c/n  isosorbide 30 mg daily added  7)Abnormal imaging findings. Imaging findings suggest thyroid nodule and possible ---outpatient follow-up with PCP for further investigation/work-up strongly advised -Outpatient biopsy scheduled by Loni Beckwith patient's endocrinologist  8)Morbid obesity/possible OSA-- BMIwas>40.After aggressive diuresis BMI is now 37.5 -Given documented history of hypercapnia patient needs outpatient sleep study  9) chronic anemia--- hemoglobin currently stable    DVT prophylaxis:Eliquis Code Status:Full code   Disposition/Need for in-Hospital Stay- patient unable to be discharged at this time due to --- acute pancreatitis requiring n.p.o. status and IV fluids, as well as IV antiemetics and IV opiates*  Status is: Inpatient   Dispo: The patient is from: Home  Anticipated d/c is to: Home  Anticipated d/c date is: 2 days Barriers: Not Clinically Stable- -acute pancreatitis requiring n.p.o. status and IV fluids, as well as IV antiemetics and IV opiates*   Family Communication:    (patient is alert, awake and coherent)  Discussed with daughter at bedside  Consults  :  na  DVT Prophylaxis  : Eliquis  Lab Results  Component Value Date   PLT 228 12/27/2019    Inpatient Medications  Scheduled Meds: . apixaban  2.5 mg Oral BID  . diltiazem  120 mg  Oral Daily  . gabapentin  300 mg Oral BID  . hydrALAZINE  100 mg Oral BID  . insulin aspart  0-5 Units Subcutaneous QHS  . insulin aspart  0-9 Units Subcutaneous TID WC  . insulin detemir  50 Units Subcutaneous QHS  . isosorbide mononitrate  30 mg Oral Daily  . sodium chloride flush  3 mL Intravenous Q12H   Continuous Infusions: . sodium chloride    . sodium chloride 125 mL/hr at 12/27/19 1429  . famotidine (PEPCID) IV 20 mg (12/27/19 1430)   PRN Meds:.sodium chloride, acetaminophen, albuterol, HYDROmorphone (DILAUDID) injection, labetalol, ondansetron **OR** ondansetron (ZOFRAN) IV, oxyCODONE, polyethylene glycol, sodium chloride flush, traZODone    Anti-infectives (From admission, onward)   None        Objective:   Vitals:   12/27/19 0402 12/27/19 0500 12/27/19 0631 12/27/19 1718  BP: (!) 186/66  (!) 153/59 (!) 163/59  Pulse: 87  83 76  Resp: 18   20  Temp: 99.3 F (37.4 C)   99.5 F (37.5 C)  TempSrc:      SpO2: 92%   90%  Weight:  106.8 kg    Height:        Wt Readings from Last 3 Encounters:  12/27/19 106.8 kg  10/09/19 101.6 kg  08/04/19 100.5 kg     Intake/Output Summary (Last 24 hours) at 12/27/2019 1757 Last data filed at 12/27/2019 1700 Gross per 24 hour  Intake --  Output 2250 ml  Net -2250 ml     Physical Exam  Gen:- Awake Alert, in no acute distress HEENT:- [place].AT, No sclera icterus Neck-Supple Neck,No JVD,.  Lungs-  CTAB , fair symmetrical air movement CV- S1, S2 normal, regular  Abd-  +ve B.Sounds, Abd Soft, periumbilical and epigastric area tenderness without rebound or guarding    Extremity/Skin:- No  edema, pedal pulses present  Psych-affect is appropriate, oriented x3 Neuro-generalized weakness without new focal deficits , no tremors   Data Review:   Micro Results Recent Results (from the past 240 hour(s))  SARS Coronavirus 2 by RT PCR (hospital order, performed in [facility] hospital lab) Nasopharyngeal Nasopharyngeal Swab      Status: None   Collection Time: 12/26/19 11:36 AM   Specimen: Nasopharyngeal Swab  Result Value Ref Range Status   SARS Coronavirus 2 NEGATIVE NEGATIVE Final    Comment: (NOTE) SARS-CoV-2 target nucleic acids are NOT DETECTED.  The SARS-CoV-2 RNA is generally detectable in upper and lower respiratory specimens during the acute phase of infection. The lowest concentration of SARS-CoV-2 viral copies this assay can detect is 250 copies / mL. A negative result does not preclude SARS-CoV-2 infection and should not be used as the sole basis for treatment or other patient management decisions.  A negative result may occur with improper specimen collection / handling, submission of specimen other than nasopharyngeal swab, presence of viral mutation(s) within the areas targeted by this assay, and inadequate number of viral copies (<250 copies / mL). A negative result must be combined with clinical observations, patient history, and epidemiological information.  Fact Sheet for Patients:   StrictlyIdeas.no  Fact Sheet for Healthcare Providers: BankingDealers.co.za  This test is not yet approved or  cleared by the [place] FDA and has been  authorized for detection and/or diagnosis of SARS-CoV-2 by FDA under an Emergency Use Authorization (EUA).  This EUA will remain in effect (meaning this test can be used) for the duration of the COVID-19 declaration under Section 564(b)(1) of the Act, 21 U.S.C. section 360bbb-3(b)(1), unless the authorization is terminated or revoked sooner.  Performed at [facility], [address] [place], [place] [address]     Radiology Reports CT Abdomen Pelvis W Contrast  Result Date: 12/26/2019 CLINICAL DATA:  PT brought in by [facility] from home c/o epigastric nagging pain with nausea that started yesterday evening. PT states she had some vomiting last night and no diarrhea. EXAM: CT ABDOMEN AND PELVIS WITH  CONTRAST TECHNIQUE: Multidetector CT imaging of the abdomen and pelvis was performed using the standard protocol following bolus administration of intravenous contrast. CONTRAST:  62mL OMNIPAQUE IOHEXOL 300 MG/ML  SOLN COMPARISON:  07/23/2019 FINDINGS: Lower chest: No acute abnormality. Hepatobiliary: No focal liver abnormality is seen. Status post cholecystectomy. No biliary dilatation. Pancreas: There is peripancreatic inflammation reflected by stranding and hazy increased attenuation in the peripancreatic fat that extends along the second and third portion of the duodenum and gastrohepatic ligament as well as the root of the small bowel mesentery. Mild inflammation is noted along the right anterior pararenal fascia. Pancreas shows homogeneous enhancement. There is irregular duct dilation, maximum 6 mm. No pancreatic mass. No collection to suggest a pseudocyst or abscess. Spleen: Normal in size without focal abnormality. Adrenals/Urinary Tract: No adrenal masses. Bilateral renal cortical thinning. Small low-density renal lesions are noted, largest anterior upper pole of the left kidney, 11 mm, consistent with cysts, and without change from the prior CT. Small nonobstructing stone in the upper pole the left kidney. Bilateral vascular calcifications. No hydronephrosis. Normal ureters. Normal bladder. Stomach/Bowel: Normal stomach. Small bowel and colon are normal in caliber. No wall thickening. No inflammation. Normal appendix visualized. Vascular/Lymphatic: Patent portal vein, splenic vein and superior mesenteric vein. Aortic and branch vessel atherosclerosis. No aneurysm. There are subcentimeter peripancreatic and gastrohepatic ligament lymph nodes. Reproductive: Normal uterus. Bilateral tubal ligation clips, stable. No adnexal masses. Other: No ascites. There is increased attenuation in the subcutaneous fat of the lower abdomen similar to the prior CT. Small fat containing umbilical hernia. Musculoskeletal: No  fracture or acute finding. No osteoblastic or osteolytic lesions. Previous right proximal femur ORIF with a compression screw. Chronic appearing avascular necrosis along the superior right femoral head. IMPRESSION: 1. Acute uncomplicated pancreatitis. No evidence of pancreatic necrosis or of an abscess or pseudocyst. No venous thrombosis. 2. No other acute abnormality within the abdomen or pelvis. 3. Aortic atherosclerosis. Electronically Signed   By: Lajean Manes M.D.   On: 12/26/2019 13:15   DG Chest Port 1 View  Result Date: 12/26/2019 CLINICAL DATA:  Epigastric abdominal pain and vomiting. EXAM: PORTABLE CHEST 1 VIEW COMPARISON:  07/31/2019 FINDINGS: The heart is enlarged but stable. Stable tortuosity of the thoracic aorta. Stable eventration of the left hemidiaphragm with some overlying vascular crowding and atelectasis. No infiltrates or effusions. IMPRESSION: 1. Stable cardiac enlargement. 2. No acute pulmonary findings. Electronically Signed   By: Marijo Sanes M.D.   On: 12/26/2019 09:45     CBC Recent Labs  Lab 12/26/19 0818 12/27/19 0425  WBC 13.2* 14.5*  HGB 12.4 10.5*  HCT 39.9 33.8*  PLT 263 228  MCV 89.5 90.9  MCH 27.8 28.2  MCHC 31.1 31.1  RDW 16.1* 16.8*  LYMPHSABS 1.8  --   MONOABS 0.6  --  EOSABS 0.1  --   BASOSABS 0.0  --     Chemistries  Recent Labs  Lab 12/26/19 0818 12/27/19 0425  NA 139 139  K 4.0 4.4  CL 103 107  CO2 25 23  GLUCOSE 345* 428*  BUN 24* 21  CREATININE 1.31* 1.45*  CALCIUM 8.7* 7.9*  AST 18 12*  ALT 16 12  ALKPHOS 63 51  BILITOT 0.5 0.5   ------------------------------------------------------------------------------------------------------------------ Recent Labs    12/27/19 0425  CHOL 118  HDL 33*  LDLCALC 75  TRIG 52  CHOLHDL 3.6    Lab Results  Component Value Date   HGBA1C 8.0 (H) 07/23/2019   ------------------------------------------------------------------------------------------------------------------ No  results for input(s): TSH, T4TOTAL, T3FREE, THYROIDAB in the last 72 hours.  Invalid input(s): FREET3 ------------------------------------------------------------------------------------------------------------------ No results for input(s): VITAMINB12, FOLATE, FERRITIN, TIBC, IRON, RETICCTPCT in the last 72 hours.  Coagulation profile No results for input(s): INR, PROTIME in the last 168 hours.  No results for input(s): DDIMER in the last 72 hours.  Cardiac Enzymes No results for input(s): CKMB, TROPONINI, MYOGLOBIN in the last 168 hours.  Invalid input(s): CK ------------------------------------------------------------------------------------------------------------------    Component Value Date/Time   BNP 116.0 (H) 12/26/2019 9532     Roxan Hockey M.D on 12/27/2019 at 5:57 PM  Go to [email or web address] - for contact info  [facility] - Office  [phone]

## 2019-12-28 LAB — GLUCOSE, CAPILLARY
Glucose-Capillary: 167 mg/dL — ABNORMAL HIGH (ref 70–99)
Glucose-Capillary: 197 mg/dL — ABNORMAL HIGH (ref 70–99)
Glucose-Capillary: 205 mg/dL — ABNORMAL HIGH (ref 70–99)
Glucose-Capillary: 215 mg/dL — ABNORMAL HIGH (ref 70–99)
Glucose-Capillary: 221 mg/dL — ABNORMAL HIGH (ref 70–99)
Glucose-Capillary: 230 mg/dL — ABNORMAL HIGH (ref 70–99)

## 2019-12-28 LAB — BASIC METABOLIC PANEL
Anion gap: 8 (ref 5–15)
BUN: 31 mg/dL — ABNORMAL HIGH (ref 8–23)
CO2: 23 mmol/L (ref 22–32)
Calcium: 8.1 mg/dL — ABNORMAL LOW (ref 8.9–10.3)
Chloride: 110 mmol/L (ref 98–111)
Creatinine, Ser: 1.74 mg/dL — ABNORMAL HIGH (ref 0.44–1.00)
GFR calc Af Amer: 31 mL/min — ABNORMAL LOW (ref >60)
GFR calc non Af Amer: 27 mL/min — ABNORMAL LOW (ref >60)
Glucose, Bld: 241 mg/dL — ABNORMAL HIGH (ref 70–99)
Potassium: 4.6 mmol/L (ref 3.5–5.1)
Sodium: 141 mmol/L (ref 135–145)

## 2019-12-28 LAB — CBC
HCT: 32 % — ABNORMAL LOW (ref 36.0–46.0)
Hemoglobin: 9.7 g/dL — ABNORMAL LOW (ref 12.0–15.0)
MCH: 28 pg (ref 26.0–34.0)
MCHC: 30.3 g/dL (ref 30.0–36.0)
MCV: 92.5 fL (ref 80.0–100.0)
Platelets: 207 10*3/uL (ref 150–400)
RBC: 3.46 MIL/uL — ABNORMAL LOW (ref 3.87–5.11)
RDW: 17 % — ABNORMAL HIGH (ref 11.5–15.5)
WBC: 16.4 10*3/uL — ABNORMAL HIGH (ref 4.0–10.5)
nRBC: 0 % (ref 0.0–0.2)

## 2019-12-28 LAB — LIPASE, BLOOD: Lipase: 120 U/L — ABNORMAL HIGH (ref 11–51)

## 2019-12-28 MED ORDER — INSULIN DETEMIR 100 UNIT/ML ~~LOC~~ SOLN
60.0000 [IU] | Freq: Every day | SUBCUTANEOUS | Status: DC
  Administered 2019-12-28 – 2020-01-02 (×6): 60 [IU] via SUBCUTANEOUS
  Filled 2019-12-28 (×7): qty 0.6

## 2019-12-28 MED ORDER — BISACODYL 10 MG RE SUPP
10.0000 mg | Freq: Once | RECTAL | Status: AC
  Administered 2019-12-28: 10 mg via RECTAL
  Filled 2019-12-28: qty 1

## 2019-12-28 MED ORDER — SENNOSIDES-DOCUSATE SODIUM 8.6-50 MG PO TABS
2.0000 | ORAL_TABLET | Freq: Two times a day (BID) | ORAL | Status: DC
  Administered 2019-12-28 – 2020-01-03 (×11): 2 via ORAL
  Filled 2019-12-28 (×12): qty 2

## 2019-12-28 NOTE — Progress Notes (Signed)
Patient Demographics:    [name], is a 85 y.o. female, DOB - 03-08-1935, LSL:[id number]  Admit date - 12/26/2019   Admitting Physician Nemiah Bubar Denton Brick, MD  Outpatient Primary MD for the patient is [name], MD  LOS - 2   Chief Complaint  Patient presents with  . Abdominal Pain        Subjective:    [name] today has no fevers, ,  No chest pain,   -Appetite remains poor, not really drinking much -Nausea improved but not resolved  abdominal pain improving  Assessment  & Plan :    Active Problems:   Pancreatitis  Brief Summary:- 85 y.o. female with past medical history relevant for HTN, DM, CKD 3B,dCHF ,PAFib, chronic LBBand chronic lower extremity edema admitted on 12/26/2019 with nausea vomiting the setting of acute pancreatitis similar to her presentation in March 2021  A/p 1)Acute Pancreatitis--- IV fluids,  , Pepcid, as needed antiemetics and opiates -CT abdomen pelvis consistent with acute pancreatitis -Recent lipid profile without significant triglyceridemia -Please note the patient was admitted for same back in March 2021 -Patient is status post prior cholecystectomy -Review of her medications indicated torsemide, gabapentin and simvastatin could have triggered her pancreatitis  -Lipase is down to 120 from over 1400, -- nausea and abdominal pain improving okay to try liquid diet  2)PAFib--- patient with paroxysmal atrial fibrillation,CHADSVASc of 8. She is currently on oral diltiazem CD 120 mg for rate control. -continue Eliquis 2.5 mg twice daily for stroke prophylaxis  -Appears back in sinus rhythm -Echo from 07/08/2019 with EF of 60 to 65% -Nuclear stress test from 07/18/2019 without reversible ischemia  3) chronic diastolic congestive heart failure-right-sided heart failure symptoms noted--- watch volume status closely with IV fluids for pancreatitis -Last known EF  60 to 65% -Chronic lower extremity edema, appears currently compensated  4)CKD stage - IIIb--- renal function appears to be stable at this time -- renally adjust medications, avoid nephrotoxic agents / dehydration / hypotension  5)Diabetes--- PTA patient was chronically on high-dose Levemir.  -Given persistent hypoglycemia will change IV fluids to normal saline from D5, and increase Levemir insulin further  to 60 units ---Use Novolog/Humalog Sliding scale insulin with Accu-Cheks/Fingersticks as ordered    6)HTN--- BP is not at goal, okay to increase hydralazine 200 mg twice daily, continue Cardizem CD 120 mg,, c/n  isosorbide 30 mg daily added  7)Abnormal imaging findings. Imaging findings suggest thyroid nodule and possible ---outpatient follow-up with PCP for further investigation/work-up strongly advised -Outpatient biopsy scheduled by Loni Beckwith patient's endocrinologist  8)Morbid obesity/possible OSA--BMI 38, diet and lifestyle modification discussed  -Given documented history of hypercapnia patient needs outpatient sleep study  9) chronic anemia--- hemoglobin currently stable   DVT prophylaxis:Eliquis Code Status:Full code   Disposition/Need for in-Hospital Stay- patient unable to be discharged at this time due to --- acute pancreatitis requiring  IV fluids, as well as IV antiemetics and IV opiates*  Status is: Inpatient  Dispo: The patient is from: Home  Anticipated d/c is to: Home  Anticipated d/c date is: 2 days Barriers: Not Clinically Stable- -acute pancreatitis requiring  IV fluids, as well as IV antiemetics and IV opiates*   Family Communication:    (patient is alert, awake  and coherent)  Discussed with daughter at bedside  Consults  :  na  DVT Prophylaxis  : Eliquis  Lab Results  Component Value Date   PLT 207 12/28/2019    Inpatient Medications  Scheduled Meds: . apixaban  2.5 mg Oral BID    . diltiazem  120 mg Oral Daily  . gabapentin  300 mg Oral BID  . hydrALAZINE  100 mg Oral BID  . insulin aspart  0-5 Units Subcutaneous QHS  . insulin aspart  0-9 Units Subcutaneous TID WC  . insulin detemir  60 Units Subcutaneous QHS  . isosorbide mononitrate  30 mg Oral Daily  . senna-docusate  2 tablet Oral BID  . sodium chloride flush  3 mL Intravenous Q12H   Continuous Infusions: . sodium chloride    . sodium chloride 125 mL/hr at 12/28/19 1634  . famotidine (PEPCID) IV 20 mg (12/28/19 1635)   PRN Meds:.sodium chloride, acetaminophen, albuterol, HYDROmorphone (DILAUDID) injection, labetalol, ondansetron **OR** ondansetron (ZOFRAN) IV, oxyCODONE, polyethylene glycol, sodium chloride flush, traZODone    Anti-infectives (From admission, onward)   None        Objective:   Vitals:   12/27/19 1718 12/27/19 2041 12/27/19 2204 12/28/19 0527  BP: (!) 163/59 (!) 164/59 (!) 144/71 (!) 167/66  Pulse: 76  79 67  Resp: 20  18 18   Temp: 99.5 F (37.5 C)  99.4 F (37.4 C) 98.2 F (36.8 C)  TempSrc:   Oral   SpO2: 90%  94% 98%  Weight:      Height:        Wt Readings from Last 3 Encounters:  12/27/19 106.8 kg  10/09/19 101.6 kg  08/04/19 100.5 kg     Intake/Output Summary (Last 24 hours) at 12/28/2019 1934 Last data filed at 12/28/2019 1500 Gross per 24 hour  Intake 840 ml  Output 1250 ml  Net -410 ml     Physical Exam  Gen:- Awake Alert, in no acute distress HEENT:- [place].AT, No sclera icterus Neck-Supple Neck,No JVD,.  Lungs-  CTAB , fair symmetrical air movement CV- S1, S2 normal, regular  Abd-  +ve B.Sounds, Abd Soft, periumbilical and epigastric area tenderness without rebound or guarding    Extremity/Skin:- No  edema, pedal pulses present  Psych-affect is appropriate, oriented x3 Neuro-generalized weakness without new focal deficits , no tremors   Data Review:   Micro Results Recent Results (from the past 240 hour(s))  SARS Coronavirus 2 by RT PCR  (hospital order, performed in [facility] hospital lab) Nasopharyngeal Nasopharyngeal Swab     Status: None   Collection Time: 12/26/19 11:36 AM   Specimen: Nasopharyngeal Swab  Result Value Ref Range Status   SARS Coronavirus 2 NEGATIVE NEGATIVE Final    Comment: (NOTE) SARS-CoV-2 target nucleic acids are NOT DETECTED.  The SARS-CoV-2 RNA is generally detectable in upper and lower respiratory specimens during the acute phase of infection. The lowest concentration of SARS-CoV-2 viral copies this assay can detect is 250 copies / mL. A negative result does not preclude SARS-CoV-2 infection and should not be used as the sole basis for treatment or other patient management decisions.  A negative result may occur with improper specimen collection / handling, submission of specimen other than nasopharyngeal swab, presence of viral mutation(s) within the areas targeted by this assay, and inadequate number of viral copies (<250 copies / mL). A negative result must be combined with clinical observations, patient history, and epidemiological information.  Fact Sheet for Patients:  StrictlyIdeas.no  Fact Sheet for Healthcare Providers: BankingDealers.co.za  This test is not yet approved or  cleared by the [place] FDA and has been authorized for detection and/or diagnosis of SARS-CoV-2 by FDA under an Emergency Use Authorization (EUA).  This EUA will remain in effect (meaning this test can be used) for the duration of the COVID-19 declaration under Section 564(b)(1) of the Act, 21 U.S.C. section 360bbb-3(b)(1), unless the authorization is terminated or revoked sooner.  Performed at [facility], [address] [place], [place] [address]     Radiology Reports CT Abdomen Pelvis W Contrast  Result Date: 12/26/2019 CLINICAL DATA:  PT brought in by [facility] from home c/o epigastric nagging pain with nausea that started yesterday evening. PT  states she had some vomiting last night and no diarrhea. EXAM: CT ABDOMEN AND PELVIS WITH CONTRAST TECHNIQUE: Multidetector CT imaging of the abdomen and pelvis was performed using the standard protocol following bolus administration of intravenous contrast. CONTRAST:  60mL OMNIPAQUE IOHEXOL 300 MG/ML  SOLN COMPARISON:  07/23/2019 FINDINGS: Lower chest: No acute abnormality. Hepatobiliary: No focal liver abnormality is seen. Status post cholecystectomy. No biliary dilatation. Pancreas: There is peripancreatic inflammation reflected by stranding and hazy increased attenuation in the peripancreatic fat that extends along the second and third portion of the duodenum and gastrohepatic ligament as well as the root of the small bowel mesentery. Mild inflammation is noted along the right anterior pararenal fascia. Pancreas shows homogeneous enhancement. There is irregular duct dilation, maximum 6 mm. No pancreatic mass. No collection to suggest a pseudocyst or abscess. Spleen: Normal in size without focal abnormality. Adrenals/Urinary Tract: No adrenal masses. Bilateral renal cortical thinning. Small low-density renal lesions are noted, largest anterior upper pole of the left kidney, 11 mm, consistent with cysts, and without change from the prior CT. Small nonobstructing stone in the upper pole the left kidney. Bilateral vascular calcifications. No hydronephrosis. Normal ureters. Normal bladder. Stomach/Bowel: Normal stomach. Small bowel and colon are normal in caliber. No wall thickening. No inflammation. Normal appendix visualized. Vascular/Lymphatic: Patent portal vein, splenic vein and superior mesenteric vein. Aortic and branch vessel atherosclerosis. No aneurysm. There are subcentimeter peripancreatic and gastrohepatic ligament lymph nodes. Reproductive: Normal uterus. Bilateral tubal ligation clips, stable. No adnexal masses. Other: No ascites. There is increased attenuation in the subcutaneous fat of the lower  abdomen similar to the prior CT. Small fat containing umbilical hernia. Musculoskeletal: No fracture or acute finding. No osteoblastic or osteolytic lesions. Previous right proximal femur ORIF with a compression screw. Chronic appearing avascular necrosis along the superior right femoral head. IMPRESSION: 1. Acute uncomplicated pancreatitis. No evidence of pancreatic necrosis or of an abscess or pseudocyst. No venous thrombosis. 2. No other acute abnormality within the abdomen or pelvis. 3. Aortic atherosclerosis. Electronically Signed   By: Lajean Manes M.D.   On: 12/26/2019 13:15   DG Chest Port 1 View  Result Date: 12/26/2019 CLINICAL DATA:  Epigastric abdominal pain and vomiting. EXAM: PORTABLE CHEST 1 VIEW COMPARISON:  07/31/2019 FINDINGS: The heart is enlarged but stable. Stable tortuosity of the thoracic aorta. Stable eventration of the left hemidiaphragm with some overlying vascular crowding and atelectasis. No infiltrates or effusions. IMPRESSION: 1. Stable cardiac enlargement. 2. No acute pulmonary findings. Electronically Signed   By: Marijo Sanes M.D.   On: 12/26/2019 09:45     CBC Recent Labs  Lab 12/26/19 0818 12/27/19 0425 12/28/19 0621  WBC 13.2* 14.5* 16.4*  HGB 12.4 10.5* 9.7*  HCT 39.9 33.8* 32.0*  PLT 263 228 207  MCV 89.5 90.9 92.5  MCH 27.8 28.2 28.0  MCHC 31.1 31.1 30.3  RDW 16.1* 16.8* 17.0*  LYMPHSABS 1.8  --   --   MONOABS 0.6  --   --   EOSABS 0.1  --   --   BASOSABS 0.0  --   --     Chemistries  Recent Labs  Lab 12/26/19 0818 12/27/19 0425 12/28/19 0621  NA 139 139 141  K 4.0 4.4 4.6  CL 103 107 110  CO2 25 23 23   GLUCOSE 345* 428* 241*  BUN 24* 21 31*  CREATININE 1.31* 1.45* 1.74*  CALCIUM 8.7* 7.9* 8.1*  AST 18 12*  --   ALT 16 12  --   ALKPHOS 63 51  --   BILITOT 0.5 0.5  --    ------------------------------------------------------------------------------------------------------------------ Recent Labs    12/27/19 0425  CHOL 118  HDL  33*  LDLCALC 75  TRIG 52  CHOLHDL 3.6    Lab Results  Component Value Date   HGBA1C 8.0 (H) 07/23/2019   ------------------------------------------------------------------------------------------------------------------ No results for input(s): TSH, T4TOTAL, T3FREE, THYROIDAB in the last 72 hours.  Invalid input(s): FREET3 ------------------------------------------------------------------------------------------------------------------ No results for input(s): VITAMINB12, FOLATE, FERRITIN, TIBC, IRON, RETICCTPCT in the last 72 hours.  Coagulation profile No results for input(s): INR, PROTIME in the last 168 hours.  No results for input(s): DDIMER in the last 72 hours.  Cardiac Enzymes No results for input(s): CKMB, TROPONINI, MYOGLOBIN in the last 168 hours.  Invalid input(s): CK ------------------------------------------------------------------------------------------------------------------    Component Value Date/Time   BNP 116.0 (H) 12/26/2019 2947     Roxan Hockey M.D on 12/28/2019 at 7:34 PM  Go to [email or web address] - for contact info  [facility] - Office  [phone]

## 2019-12-28 NOTE — Progress Notes (Signed)
Inpatient Diabetes Program Recommendations  AACE/ADA: New Consensus Statement on Inpatient Glycemic Control (2015)  Target Ranges:  Prepandial:   less than 140 mg/dL      Peak postprandial:   less than 180 mg/dL (1-2 hours)      Critically ill patients:  140 - 180 mg/dL   Lab Results  Component Value Date   GLUCAP 215 (H) 12/28/2019   HGBA1C 8.0 (H) 07/23/2019    Review of Glycemic Control Results for CHRISTALYN, GOERTZ (MRN [id number]) as of 12/28/2019 09:18  Ref. Range 12/27/2019 16:35 12/27/2019 22:08 12/27/2019 23:57 12/28/2019 05:23 12/28/2019 08:32  Glucose-Capillary Latest Ref Range: 70 - 99 mg/dL 365 (H) 271 (H) 223 (H) 205 (H) 215 (H)    Diabetes history: DM2 Outpatient Diabetes medications: Levemir 80 units QHS, Novolog 0-10 units TID with meals Current orders for Inpatient glycemic control: Levemir 50 units QHS, Novolog 0-9 TID + 0-5 QHS  Inpatient Diabetes Program Recommendations:     Levemir 60 units QHS  Novolog 0-15 units TID   Will continue to follow while inpatient.  Thank you, Reche Dixon, RN, BSN Diabetes Coordinator Inpatient Diabetes Program [phone] (team pager from 8a-5p)

## 2019-12-29 ENCOUNTER — Inpatient Hospital Stay (HOSPITAL_COMMUNITY): Payer: Medicare HMO

## 2019-12-29 LAB — GLUCOSE, CAPILLARY
Glucose-Capillary: 136 mg/dL — ABNORMAL HIGH (ref 70–99)
Glucose-Capillary: 138 mg/dL — ABNORMAL HIGH (ref 70–99)
Glucose-Capillary: 156 mg/dL — ABNORMAL HIGH (ref 70–99)
Glucose-Capillary: 156 mg/dL — ABNORMAL HIGH (ref 70–99)
Glucose-Capillary: 169 mg/dL — ABNORMAL HIGH (ref 70–99)

## 2019-12-29 LAB — BASIC METABOLIC PANEL
Anion gap: 10 (ref 5–15)
BUN: 39 mg/dL — ABNORMAL HIGH (ref 8–23)
CO2: 20 mmol/L — ABNORMAL LOW (ref 22–32)
Calcium: 8.1 mg/dL — ABNORMAL LOW (ref 8.9–10.3)
Chloride: 111 mmol/L (ref 98–111)
Creatinine, Ser: 1.82 mg/dL — ABNORMAL HIGH (ref 0.44–1.00)
GFR calc Af Amer: 29 mL/min — ABNORMAL LOW (ref >60)
GFR calc non Af Amer: 25 mL/min — ABNORMAL LOW (ref >60)
Glucose, Bld: 178 mg/dL — ABNORMAL HIGH (ref 70–99)
Potassium: 4.2 mmol/L (ref 3.5–5.1)
Sodium: 141 mmol/L (ref 135–145)

## 2019-12-29 LAB — CBC
HCT: 31.9 % — ABNORMAL LOW (ref 36.0–46.0)
Hemoglobin: 9.6 g/dL — ABNORMAL LOW (ref 12.0–15.0)
MCH: 28.1 pg (ref 26.0–34.0)
MCHC: 30.1 g/dL (ref 30.0–36.0)
MCV: 93.3 fL (ref 80.0–100.0)
Platelets: 222 10*3/uL (ref 150–400)
RBC: 3.42 MIL/uL — ABNORMAL LOW (ref 3.87–5.11)
RDW: 17.2 % — ABNORMAL HIGH (ref 11.5–15.5)
WBC: 15.8 10*3/uL — ABNORMAL HIGH (ref 4.0–10.5)
nRBC: 0 % (ref 0.0–0.2)

## 2019-12-29 LAB — LIPASE, BLOOD: Lipase: 23 U/L (ref 11–51)

## 2019-12-29 MED ORDER — TAMSULOSIN HCL 0.4 MG PO CAPS
0.4000 mg | ORAL_CAPSULE | Freq: Every day | ORAL | Status: DC
  Administered 2019-12-29 – 2020-01-03 (×6): 0.4 mg via ORAL
  Filled 2019-12-29 (×6): qty 1

## 2019-12-29 MED ORDER — FUROSEMIDE 10 MG/ML IJ SOLN
40.0000 mg | Freq: Once | INTRAMUSCULAR | Status: AC
  Administered 2019-12-29: 40 mg via INTRAVENOUS
  Filled 2019-12-29: qty 4

## 2019-12-29 MED ORDER — ALBUTEROL SULFATE (2.5 MG/3ML) 0.083% IN NEBU
2.5000 mg | INHALATION_SOLUTION | Freq: Once | RESPIRATORY_TRACT | Status: AC
  Administered 2019-12-29: 2.5 mg via RESPIRATORY_TRACT
  Filled 2019-12-29: qty 3

## 2019-12-29 MED ORDER — FUROSEMIDE 10 MG/ML IJ SOLN
20.0000 mg | Freq: Two times a day (BID) | INTRAMUSCULAR | Status: DC
  Administered 2019-12-29 – 2020-01-02 (×8): 20 mg via INTRAVENOUS
  Filled 2019-12-29 (×8): qty 2

## 2019-12-29 NOTE — Progress Notes (Signed)
In and out Cath completed. Patient voided 700 cc amber colored urine. Will continue to monitor patient.

## 2019-12-29 NOTE — Progress Notes (Signed)
Nurse called due to the patient having increasing respiratory difficulty.  Patient was on IV hydration at 125 ml/hour due to acute pancreatitis.  At bedside, patient was noted to have diffuse wheezing with mild crackles in lower lobes.  Chest x-ray was ordered and showed cardiomegaly with slight increase in interstitial pattern, likely reflecting mild edema.  IV fluid was temporarily stopped at this time, IV Lasix 40 Mg x1 was given. Lipase on admission was 120, this will be repeated in the morning. We shall continue to monitor patient and treat accordingly.

## 2019-12-29 NOTE — Progress Notes (Signed)
Patient has had two small bowel movements.Patient c/o difficulty breathing and wheezing at approximately 0300. Patient stated she had phlegm in her throat and that it is a common occurrence for her when she is at home.  Patient did have upper airway wheezing and was tachypneac.  Patient received breathing treatment and oxygen saturation was 100% on 2 liters.  Patient attempted to sit on side of bed but was too weak to move her extremities.  MD notified.  Chest x-ray done per MD order.  Awaiting further orders from MD at this time.

## 2019-12-29 NOTE — Progress Notes (Signed)
Patient Demographics:    [name], is a 85 y.o. female, DOB - 1935/01/03, VFI:[id number]  Admit date - 12/26/2019   Admitting Physician Lisl Slingerland Denton Brick, MD  Outpatient Primary MD for the patient is [name], MD  LOS - 3   Chief Complaint  Patient presents with   Abdominal Pain        Subjective:    [name] today has no fevers, ,  No chest pain,    Had shob and wheezing overnight-----clinical exam and cxr confirms fluid overload----received lasix 40 mg iv x 1 with good diuresis and improvement in hypoxia -Required in and out catheterization due to urinary retention with 700 mL of urine removed  Assessment  & Plan :    Active Problems:   Pancreatitis  Brief Summary:- 85 y.o. female with past medical history relevant for HTN, DM, CKD 3B,dCHF ,PAFib, chronic LBBand chronic lower extremity edema admitted on 12/26/2019 with nausea vomiting the setting of acute pancreatitis similar to her presentation in March 2021  A/p 1)Acute Pancreatitis---    Pepcid, as needed antiemetics and opiates -CT abdomen pelvis consistent with acute pancreatitis -Recent lipid profile without significant triglyceridemia -Please note the patient was admitted for same back in March 2021 -Patient is status post prior cholecystectomy -Review of her medications indicated torsemide, gabapentin and simvastatin could have triggered her pancreatitis  -Lipase  1400 >> 120 >>23 -- nausea and abdominal pain improving --Patient reluctant to have liquids diet at this time -Stop IV fluids due to volume overload  2)PAFib--- patient with paroxysmal atrial fibrillation,CHADSVASc of 8. She is currently on oral diltiazem CD 120 mg for rate control. -continue Eliquis 2.5 mg twice daily for stroke prophylaxis  -Appears back in sinus rhythm -Echo from 07/08/2019 with EF of 60 to 65% -Nuclear stress test from 07/18/2019 without  reversible ischemia  3) chronic diastolic congestive heart failure-right-sided heart failure symptoms noted---   -Last known EF 60 to 65% -Chronic lower extremity edema,  -Developed volume overload in the setting of IV hydration for acute pancreatitis -Respiratory status and hypoxia improved with IV Lasix and diuresis  4)CKD stage - IIIb--- renal function appears to be stable at this time -- renally adjust medications, avoid nephrotoxic agents / dehydration / hypotension -Anticipate worsening renal function with diuretics  5)Diabetes--- PTA patient was chronically on high-dose Levemir.  -Given persistent hypoglycemia will change IV fluids to normal saline from D5, and increase Levemir insulin further  to 60 units ---Use Novolog/Humalog Sliding scale insulin with Accu-Cheks/Fingersticks as ordered    6)HTN--- BP is not at goal, c/n  hydralazine 200 mg twice daily, continue Cardizem CD 120 mg,, c/n  isosorbide 30 mg daily added -May improve with IV diuretics  7)Abnormal imaging findings. Imaging findings suggest thyroid nodule and possible ---outpatient follow-up with PCP for further investigation/work-up strongly advised -Outpatient biopsy scheduled by Loni Beckwith patient's endocrinologist  8)Morbid obesity/possible OSA--BMI 38, diet and lifestyle modification discussed  -Given documented history of hypercapnia patient needs outpatient sleep study  9) chronic anemia--- hemoglobin currently stable  10) acute hypoxic respiratory failure--- secondary to volume overload status in the setting of IV hydration while n.p.o. for Appetite -Hypoxia improving with IV diuresis  11) generalized weakness and deconditioning----physical therapy evaluation in a.m.  after improvement in hypoxia post diuresis  12) acute urinary retention requiring in and out catheterization----Flomax as ordered,   DVT prophylaxis:Eliquis Code Status:Full code   Disposition/Need for  in-Hospital Stay- patient unable to be discharged at this time due to --- acute pancreatitis requiring  IV fluids, as well as IV antiemetics and IV opiates*  Status is: Inpatient  Dispo: The patient is from: Home  Anticipated d/c is to: Home  Anticipated d/c date is: 2 days Barriers: Not Clinically Stable- -acute pancreatitis , now with volume overload requiring IV diuretics, as well as IV antiemetics and IV opiates   Family Communication:    (patient is alert, awake and coherent)  Discussed with daughter at bedside  Consults  :  na  DVT Prophylaxis  : Eliquis  Lab Results  Component Value Date   PLT 222 12/29/2019    Inpatient Medications  Scheduled Meds:  apixaban  2.5 mg Oral BID   diltiazem  120 mg Oral Daily   furosemide  20 mg Intravenous Q12H   gabapentin  300 mg Oral BID   hydrALAZINE  100 mg Oral BID   insulin aspart  0-5 Units Subcutaneous QHS   insulin aspart  0-9 Units Subcutaneous TID WC   insulin detemir  60 Units Subcutaneous QHS   isosorbide mononitrate  30 mg Oral Daily   senna-docusate  2 tablet Oral BID   sodium chloride flush  3 mL Intravenous Q12H   tamsulosin  0.4 mg Oral QPC supper   Continuous Infusions:  sodium chloride     famotidine (PEPCID) IV 20 mg (12/29/19 1524)   PRN Meds:.sodium chloride, acetaminophen, albuterol, HYDROmorphone (DILAUDID) injection, labetalol, ondansetron **OR** ondansetron (ZOFRAN) IV, oxyCODONE, polyethylene glycol, sodium chloride flush, traZODone    Anti-infectives (From admission, onward)   None        Objective:   Vitals:   12/29/19 0357 12/29/19 0400 12/29/19 0843 12/29/19 0915  BP: 126/62  (!) 161/103   Pulse: 80  79   Resp: 20  20   Temp: 99.5 F (37.5 C)  99 F (37.2 C)   TempSrc: Oral  Oral   SpO2: 100%  98% 98%  Weight:  111.3 kg    Height:        Wt Readings from Last 3 Encounters:  12/29/19 111.3 kg  10/09/19 101.6 kg  08/04/19 100.5  kg     Intake/Output Summary (Last 24 hours) at 12/29/2019 1636 Last data filed at 12/29/2019 1524 Gross per 24 hour  Intake 4505.31 ml  Output 253 ml  Net 4252.31 ml     Physical Exam  Gen:- Awake Alert, in no acute distress HEENT:- [place].AT, No sclera icterus Nose- 2L.min Neck-Supple Neck,No JVD,.  Lungs-diminished breath sounds with bibasilar rales, upper lung fields wheezing CV- S1, S2 normal, regular  Abd-  +ve B.Sounds, Abd Soft, mostly resolved periumbilical and epigastric area tenderness  Extremity/Skin:- 1+ve  Edema, pedal pulses present  Psych-affect is appropriate, oriented x3 Neuro-Generalized weakness without new focal deficits , no tremors   Data Review:   Micro Results Recent Results (from the past 240 hour(s))  SARS Coronavirus 2 by RT PCR (hospital order, performed in [facility] hospital lab) Nasopharyngeal Nasopharyngeal Swab     Status: None   Collection Time: 12/26/19 11:36 AM   Specimen: Nasopharyngeal Swab  Result Value Ref Range Status   SARS Coronavirus 2 NEGATIVE NEGATIVE Final    Comment: (NOTE) SARS-CoV-2 target nucleic acids are NOT DETECTED.  The SARS-CoV-2 RNA is  generally detectable in upper and lower respiratory specimens during the acute phase of infection. The lowest concentration of SARS-CoV-2 viral copies this assay can detect is 250 copies / mL. A negative result does not preclude SARS-CoV-2 infection and should not be used as the sole basis for treatment or other patient management decisions.  A negative result may occur with improper specimen collection / handling, submission of specimen other than nasopharyngeal swab, presence of viral mutation(s) within the areas targeted by this assay, and inadequate number of viral copies (<250 copies / mL). A negative result must be combined with clinical observations, patient history, and epidemiological information.  Fact Sheet for Patients:    StrictlyIdeas.no  Fact Sheet for Healthcare Providers: BankingDealers.co.za  This test is not yet approved or  cleared by the [place] FDA and has been authorized for detection and/or diagnosis of SARS-CoV-2 by FDA under an Emergency Use Authorization (EUA).  This EUA will remain in effect (meaning this test can be used) for the duration of the COVID-19 declaration under Section 564(b)(1) of the Act, 21 U.S.C. section 360bbb-3(b)(1), unless the authorization is terminated or revoked sooner.  Performed at [facility], [address], [place], [place] [address]     Radiology Reports CT Abdomen Pelvis W Contrast  Result Date: 12/26/2019 CLINICAL DATA:  PT brought in by [facility] from home c/o epigastric nagging pain with nausea that started yesterday evening. PT states she had some vomiting last night and no diarrhea. EXAM: CT ABDOMEN AND PELVIS WITH CONTRAST TECHNIQUE: Multidetector CT imaging of the abdomen and pelvis was performed using the standard protocol following bolus administration of intravenous contrast. CONTRAST:  59mL OMNIPAQUE IOHEXOL 300 MG/ML  SOLN COMPARISON:  07/23/2019 FINDINGS: Lower chest: No acute abnormality. Hepatobiliary: No focal liver abnormality is seen. Status post cholecystectomy. No biliary dilatation. Pancreas: There is peripancreatic inflammation reflected by stranding and hazy increased attenuation in the peripancreatic fat that extends along the second and third portion of the duodenum and gastrohepatic ligament as well as the root of the small bowel mesentery. Mild inflammation is noted along the right anterior pararenal fascia. Pancreas shows homogeneous enhancement. There is irregular duct dilation, maximum 6 mm. No pancreatic mass. No collection to suggest a pseudocyst or abscess. Spleen: Normal in size without focal abnormality. Adrenals/Urinary Tract: No adrenal masses. Bilateral renal cortical thinning. Small  low-density renal lesions are noted, largest anterior upper pole of the left kidney, 11 mm, consistent with cysts, and without change from the prior CT. Small nonobstructing stone in the upper pole the left kidney. Bilateral vascular calcifications. No hydronephrosis. Normal ureters. Normal bladder. Stomach/Bowel: Normal stomach. Small bowel and colon are normal in caliber. No wall thickening. No inflammation. Normal appendix visualized. Vascular/Lymphatic: Patent portal vein, splenic vein and superior mesenteric vein. Aortic and branch vessel atherosclerosis. No aneurysm. There are subcentimeter peripancreatic and gastrohepatic ligament lymph nodes. Reproductive: Normal uterus. Bilateral tubal ligation clips, stable. No adnexal masses. Other: No ascites. There is increased attenuation in the subcutaneous fat of the lower abdomen similar to the prior CT. Small fat containing umbilical hernia. Musculoskeletal: No fracture or acute finding. No osteoblastic or osteolytic lesions. Previous right proximal femur ORIF with a compression screw. Chronic appearing avascular necrosis along the superior right femoral head. IMPRESSION: 1. Acute uncomplicated pancreatitis. No evidence of pancreatic necrosis or of an abscess or pseudocyst. No venous thrombosis. 2. No other acute abnormality within the abdomen or pelvis. 3. Aortic atherosclerosis. Electronically Signed   By: Dedra Skeens.D.  On: 12/26/2019 13:15   DG Chest Port 1 View  Result Date: 12/29/2019 CLINICAL DATA:  Acute onset shortness of breath. Known pancreatitis. EXAM: PORTABLE CHEST 1 VIEW COMPARISON:  One-view chest x-ray 12/26/2019 FINDINGS: Heart is enlarged. Mild interstitial pattern has increased slightly. Fluid is noted in the right minor fissure. No significant airspace consolidation is present. IMPRESSION: Cardiomegaly with slight increase in interstitial pattern, likely reflecting mild edema. No focal airspace disease or consolidation. Electronically  Signed   By: San Morelle M.D.   On: 12/29/2019 04:53   DG Chest Port 1 View  Result Date: 12/26/2019 CLINICAL DATA:  Epigastric abdominal pain and vomiting. EXAM: PORTABLE CHEST 1 VIEW COMPARISON:  07/31/2019 FINDINGS: The heart is enlarged but stable. Stable tortuosity of the thoracic aorta. Stable eventration of the left hemidiaphragm with some overlying vascular crowding and atelectasis. No infiltrates or effusions. IMPRESSION: 1. Stable cardiac enlargement. 2. No acute pulmonary findings. Electronically Signed   By: Marijo Sanes M.D.   On: 12/26/2019 09:45     CBC Recent Labs  Lab 12/26/19 0818 12/27/19 0425 12/28/19 0621 12/29/19 0556  WBC 13.2* 14.5* 16.4* 15.8*  HGB 12.4 10.5* 9.7* 9.6*  HCT 39.9 33.8* 32.0* 31.9*  PLT 263 228 207 222  MCV 89.5 90.9 92.5 93.3  MCH 27.8 28.2 28.0 28.1  MCHC 31.1 31.1 30.3 30.1  RDW 16.1* 16.8* 17.0* 17.2*  LYMPHSABS 1.8  --   --   --   MONOABS 0.6  --   --   --   EOSABS 0.1  --   --   --   BASOSABS 0.0  --   --   --     Chemistries  Recent Labs  Lab 12/26/19 0818 12/27/19 0425 12/28/19 0621 12/29/19 0556  NA 139 139 141 141  K 4.0 4.4 4.6 4.2  CL 103 107 110 111  CO2 25 23 23  20*  GLUCOSE 345* 428* 241* 178*  BUN 24* 21 31* 39*  CREATININE 1.31* 1.45* 1.74* 1.82*  CALCIUM 8.7* 7.9* 8.1* 8.1*  AST 18 12*  --   --   ALT 16 12  --   --   ALKPHOS 63 51  --   --   BILITOT 0.5 0.5  --   --    ------------------------------------------------------------------------------------------------------------------ Recent Labs    12/27/19 0425  CHOL 118  HDL 33*  LDLCALC 75  TRIG 52  CHOLHDL 3.6    Lab Results  Component Value Date   HGBA1C 8.0 (H) 07/23/2019   ------------------------------------------------------------------------------------------------------------------ No results for input(s): TSH, T4TOTAL, T3FREE, THYROIDAB in the last 72 hours.  Invalid input(s):  FREET3 ------------------------------------------------------------------------------------------------------------------ No results for input(s): VITAMINB12, FOLATE, FERRITIN, TIBC, IRON, RETICCTPCT in the last 72 hours.  Coagulation profile No results for input(s): INR, PROTIME in the last 168 hours.  No results for input(s): DDIMER in the last 72 hours.  Cardiac Enzymes No results for input(s): CKMB, TROPONINI, MYOGLOBIN in the last 168 hours.  Invalid input(s): CK ------------------------------------------------------------------------------------------------------------------    Component Value Date/Time   BNP 116.0 (H) 12/26/2019 2297     Roxan Hockey M.D on 12/29/2019 at 4:36 PM  Go to [email or web address] - for contact info  [facility] - Office  [phone]

## 2019-12-29 NOTE — Progress Notes (Signed)
Patient bladder scan reveals 121 cc urine retention. Patient received Lasix injection 20 mg at 1832.  MD has been verbally informed. Will continue to monitor patient's output.

## 2019-12-29 NOTE — Progress Notes (Signed)
Patient appears uncomfortable and has not voided. Patient's bladder scan reveals 570 cc urine retained. MD has been verbally informed. Order for In and out Cath in place.

## 2019-12-30 DIAGNOSIS — K8500 Idiopathic acute pancreatitis without necrosis or infection: Secondary | ICD-10-CM

## 2019-12-30 DIAGNOSIS — E118 Type 2 diabetes mellitus with unspecified complications: Secondary | ICD-10-CM

## 2019-12-30 DIAGNOSIS — I5032 Chronic diastolic (congestive) heart failure: Secondary | ICD-10-CM

## 2019-12-30 LAB — CBC
HCT: 30.8 % — ABNORMAL LOW (ref 36.0–46.0)
Hemoglobin: 9.5 g/dL — ABNORMAL LOW (ref 12.0–15.0)
MCH: 28.4 pg (ref 26.0–34.0)
MCHC: 30.8 g/dL (ref 30.0–36.0)
MCV: 92.2 fL (ref 80.0–100.0)
Platelets: 231 10*3/uL (ref 150–400)
RBC: 3.34 MIL/uL — ABNORMAL LOW (ref 3.87–5.11)
RDW: 16.9 % — ABNORMAL HIGH (ref 11.5–15.5)
WBC: 13 10*3/uL — ABNORMAL HIGH (ref 4.0–10.5)
nRBC: 0 % (ref 0.0–0.2)

## 2019-12-30 LAB — BASIC METABOLIC PANEL
Anion gap: 7 (ref 5–15)
BUN: 38 mg/dL — ABNORMAL HIGH (ref 8–23)
CO2: 23 mmol/L (ref 22–32)
Calcium: 8.2 mg/dL — ABNORMAL LOW (ref 8.9–10.3)
Chloride: 110 mmol/L (ref 98–111)
Creatinine, Ser: 1.63 mg/dL — ABNORMAL HIGH (ref 0.44–1.00)
GFR calc Af Amer: 33 mL/min — ABNORMAL LOW (ref >60)
GFR calc non Af Amer: 29 mL/min — ABNORMAL LOW (ref >60)
Glucose, Bld: 82 mg/dL (ref 70–99)
Potassium: 4 mmol/L (ref 3.5–5.1)
Sodium: 140 mmol/L (ref 135–145)

## 2019-12-30 LAB — GLUCOSE, CAPILLARY
Glucose-Capillary: 126 mg/dL — ABNORMAL HIGH (ref 70–99)
Glucose-Capillary: 76 mg/dL (ref 70–99)
Glucose-Capillary: 181 mg/dL — ABNORMAL HIGH (ref 70–99)
Glucose-Capillary: 216 mg/dL — ABNORMAL HIGH (ref 70–99)
Glucose-Capillary: 307 mg/dL — ABNORMAL HIGH (ref 70–99)

## 2019-12-30 MED ORDER — POLYETHYLENE GLYCOL 3350 17 G PO PACK
17.0000 g | PACK | Freq: Every day | ORAL | Status: DC
  Administered 2019-12-31 – 2020-01-01 (×2): 17 g via ORAL
  Filled 2019-12-30 (×4): qty 1

## 2019-12-30 NOTE — Progress Notes (Signed)
Patient Demographics:    [name], is a 85 y.o. female, DOB - February 03, 1935, ZOX:[id number]  Admit date - 12/26/2019   Admitting Physician Sharline Lehane Denton Brick, MD  Outpatient Primary MD for the patient is [name], MD  LOS - 4   Chief Complaint  Patient presents with  . Abdominal Pain        Subjective:    [name] today has no fevers, ,  No chest pain,    -Hard BM, voiding some, tolerating oral intake better -Daughter at bedside, -Dyspnea and hypoxia persist--- -Hypoxia persist, respiratory status still not back to baseline  Assessment  & Plan :    Principal Problem:   Pancreatitis, acute Active Problems:   New onset a-fib ([place])   Diabetes mellitus type 2 with complications (HCC)   Chronic diastolic CHF (congestive heart failure) (HCC)/EF > 60 %   CKD (chronic kidney disease), stage IIIb  Brief Summary:- 85 y.o. female with past medical history relevant for HTN, DM, CKD 3B,dCHF ,PAFib, chronic LBBand chronic lower extremity edema admitted on 12/26/2019 with nausea vomiting the setting of acute pancreatitis similar to her presentation in March 2021  A/p 1)Acute Pancreatitis---    Pepcid, as needed antiemetics and opiates -CT abdomen pelvis consistent with acute pancreatitis -Recent lipid profile without significant triglyceridemia -Please note the patient was admitted for same back in March 2021 -Patient is status post prior cholecystectomy -Review of her medications indicated torsemide, gabapentin and simvastatin could have triggered her pancreatitis  -Lipase  1400 >> 120 >>23 -- nausea and abdominal pain improving -Patient Daughter  able to get patient to take a bit more oral intake  -Stopped IV fluids due to volume overload  2)PAFib--- patient with paroxysmal atrial fibrillation,CHADSVASc of 8. She is currently on oral diltiazem CD 120 mg for rate control. -continue Eliquis  2.5 mg twice daily for stroke prophylaxis  -Appears back in sinus rhythm -Echo from 07/08/2019 with EF of 60 to 65% -Nuclear stress test from 07/18/2019 without reversible ischemia  3) chronic diastolic congestive heart failure-right-sided heart failure symptoms noted---   -Last known EF 60 to 65% -Chronic lower extremity edema,  -Developed volume overload in the setting of IV hydration for acute pancreatitis -Respiratory status and hypoxia improved with IV Lasix and diuresis -Hypoxia persist, respiratory status still not back to baseline  4)CKD stage - IIIb--- renal function appears to be stable at this time -- renally adjust medications, avoid nephrotoxic agents / dehydration / hypotension -May have to tolerate some worsening renal function with diuretics  5)Diabetes--- PTA patient was chronically on high-dose Levemir.  -Given persistent hypoglycemia will change IV fluids to normal saline from D5, and continue Levemir insulin further  to 60 units ---Use Novolog/Humalog Sliding scale insulin with Accu-Cheks/Fingersticks as ordered    6)HTN--- BP is not at goal, c/n  hydralazine 200 mg twice daily, continue Cardizem CD 120 mg,, c/n  isosorbide 30 mg daily added -May improve with IV diuretics  7)Abnormal imaging findings. Imaging findings suggest thyroid nodule and possible ---outpatient follow-up with PCP for further investigation/work-up strongly advised -Outpatient biopsy scheduled by Loni Beckwith patient's endocrinologist  8)Morbid obesity/possible OSA--BMI 38, diet and lifestyle modification discussed  -Given documented history of hypercapnia patient needs outpatient sleep study  9) chronic anemia--- hemoglobin currently stable  10) acute hypoxic respiratory failure--- secondary to volume overload status in the setting of IV hydration while n.p.o. for Appetite -Hypoxia improving with IV diuresis --Hypoxia persist, respiratory status still not back to  baseline  11) generalized weakness and deconditioning----physical therapy evaluation in a.m. after improvement in hypoxia post diuresis -May need SNF rehab  12) acute urinary retention requiring in and out catheterization----Flomax as ordered,   DVT prophylaxis:Eliquis Code Status:Full code   Disposition/Need for in-Hospital Stay- patient unable to be discharged at this time due to --- acute pancreatitis requiring  IV fluids, as well as IV antiemetics and IV opiates* --Hypoxia persist, respiratory status still not back to baseline  Status is: Inpatient  Dispo: The patient is from: Home  Anticipated d/c is to: SNF  Anticipated d/c date is: 2 days Barriers: Not Clinically Stable- -acute pancreatitis , now with volume overload requiring IV diuretics, as well as IV antiemetics and IV opiates --Hypoxia persist, respiratory status still not back to baseline   Family Communication:    (patient is alert, awake and coherent)  Discussed with daughter at bedside  Consults  :  na  DVT Prophylaxis  : Eliquis  Lab Results  Component Value Date   PLT 231 12/30/2019    Inpatient Medications  Scheduled Meds: . apixaban  2.5 mg Oral BID  . diltiazem  120 mg Oral Daily  . furosemide  20 mg Intravenous Q12H  . gabapentin  300 mg Oral BID  . hydrALAZINE  100 mg Oral BID  . insulin aspart  0-5 Units Subcutaneous QHS  . insulin aspart  0-9 Units Subcutaneous TID WC  . insulin detemir  60 Units Subcutaneous QHS  . isosorbide mononitrate  30 mg Oral Daily  . [START ON 12/31/2019] polyethylene glycol  17 g Oral Daily  . senna-docusate  2 tablet Oral BID  . sodium chloride flush  3 mL Intravenous Q12H  . tamsulosin  0.4 mg Oral QPC supper   Continuous Infusions: . sodium chloride    . famotidine (PEPCID) IV 20 mg (12/30/19 1700)   PRN Meds:.sodium chloride, acetaminophen, albuterol, HYDROmorphone (DILAUDID) injection, labetalol, ondansetron  **OR** ondansetron (ZOFRAN) IV, oxyCODONE, sodium chloride flush, traZODone    Anti-infectives (From admission, onward)   None        Objective:   Vitals:   12/29/19 0915 12/29/19 2100 12/30/19 0453 12/30/19 1337  BP:  (!) 145/72 138/67 (!) 158/78  Pulse:  79 79 82  Resp:  20 20 20   Temp:  99.2 F (37.3 C) 98.2 F (36.8 C) 98.7 F (37.1 C)  TempSrc:  Oral Oral Oral  SpO2: 98% 100% 98% 99%  Weight:      Height:        Wt Readings from Last 3 Encounters:  12/29/19 111.3 kg  10/09/19 101.6 kg  08/04/19 100.5 kg     Intake/Output Summary (Last 24 hours) at 12/30/2019 1957 Last data filed at 12/30/2019 1850 Gross per 24 hour  Intake --  Output 1600 ml  Net -1600 ml     Physical Exam  Gen:- Awake Alert, in no acute distress HEENT:- [place].AT, No sclera icterus Nose- 2L.min Neck-Supple Neck,No JVD,.  Lungs-diminished breath sounds with bibasilar rales, no wheezing  CV- S1, S2 normal, regular  Abd-  +ve B.Sounds, Abd Soft, mostly resolved periumbilical and epigastric area tenderness  Extremity/Skin:- 1+ve  Edema, pedal pulses present  Psych-affect is appropriate, oriented x3 Neuro-Generalized weakness without new focal deficits ,  no tremors   Data Review:   Micro Results Recent Results (from the past 240 hour(s))  SARS Coronavirus 2 by RT PCR (hospital order, performed in [facility] hospital lab) Nasopharyngeal Nasopharyngeal Swab     Status: None   Collection Time: 12/26/19 11:36 AM   Specimen: Nasopharyngeal Swab  Result Value Ref Range Status   SARS Coronavirus 2 NEGATIVE NEGATIVE Final    Comment: (NOTE) SARS-CoV-2 target nucleic acids are NOT DETECTED.  The SARS-CoV-2 RNA is generally detectable in upper and lower respiratory specimens during the acute phase of infection. The lowest concentration of SARS-CoV-2 viral copies this assay can detect is 250 copies / mL. A negative result does not preclude SARS-CoV-2 infection and should not be used as the sole  basis for treatment or other patient management decisions.  A negative result may occur with improper specimen collection / handling, submission of specimen other than nasopharyngeal swab, presence of viral mutation(s) within the areas targeted by this assay, and inadequate number of viral copies (<250 copies / mL). A negative result must be combined with clinical observations, patient history, and epidemiological information.  Fact Sheet for Patients:   StrictlyIdeas.no  Fact Sheet for Healthcare Providers: BankingDealers.co.za  This test is not yet approved or  cleared by the [place] FDA and has been authorized for detection and/or diagnosis of SARS-CoV-2 by FDA under an Emergency Use Authorization (EUA).  This EUA will remain in effect (meaning this test can be used) for the duration of the COVID-19 declaration under Section 564(b)(1) of the Act, 21 U.S.C. section 360bbb-3(b)(1), unless the authorization is terminated or revoked sooner.  Performed at [facility], [address], [place],  [address]     Radiology Reports CT Abdomen Pelvis W Contrast  Result Date: 12/26/2019 CLINICAL DATA:  PT brought in by [facility] from home c/o epigastric nagging pain with nausea that started yesterday evening. PT states she had some vomiting last night and no diarrhea. EXAM: CT ABDOMEN AND PELVIS WITH CONTRAST TECHNIQUE: Multidetector CT imaging of the abdomen and pelvis was performed using the standard protocol following bolus administration of intravenous contrast. CONTRAST:  41mL OMNIPAQUE IOHEXOL 300 MG/ML  SOLN COMPARISON:  07/23/2019 FINDINGS: Lower chest: No acute abnormality. Hepatobiliary: No focal liver abnormality is seen. Status post cholecystectomy. No biliary dilatation. Pancreas: There is peripancreatic inflammation reflected by stranding and hazy increased attenuation in the peripancreatic fat that extends along the second and  third portion of the duodenum and gastrohepatic ligament as well as the root of the small bowel mesentery. Mild inflammation is noted along the right anterior pararenal fascia. Pancreas shows homogeneous enhancement. There is irregular duct dilation, maximum 6 mm. No pancreatic mass. No collection to suggest a pseudocyst or abscess. Spleen: Normal in size without focal abnormality. Adrenals/Urinary Tract: No adrenal masses. Bilateral renal cortical thinning. Small low-density renal lesions are noted, largest anterior upper pole of the left kidney, 11 mm, consistent with cysts, and without change from the prior CT. Small nonobstructing stone in the upper pole the left kidney. Bilateral vascular calcifications. No hydronephrosis. Normal ureters. Normal bladder. Stomach/Bowel: Normal stomach. Small bowel and colon are normal in caliber. No wall thickening. No inflammation. Normal appendix visualized. Vascular/Lymphatic: Patent portal vein, splenic vein and superior mesenteric vein. Aortic and branch vessel atherosclerosis. No aneurysm. There are subcentimeter peripancreatic and gastrohepatic ligament lymph nodes. Reproductive: Normal uterus. Bilateral tubal ligation clips, stable. No adnexal masses. Other: No ascites. There is increased attenuation in the subcutaneous fat of the  lower abdomen similar to the prior CT. Small fat containing umbilical hernia. Musculoskeletal: No fracture or acute finding. No osteoblastic or osteolytic lesions. Previous right proximal femur ORIF with a compression screw. Chronic appearing avascular necrosis along the superior right femoral head. IMPRESSION: 1. Acute uncomplicated pancreatitis. No evidence of pancreatic necrosis or of an abscess or pseudocyst. No venous thrombosis. 2. No other acute abnormality within the abdomen or pelvis. 3. Aortic atherosclerosis. Electronically Signed   By: Lajean Manes M.D.   On: 12/26/2019 13:15   DG Chest Port 1 View  Result Date:  12/29/2019 CLINICAL DATA:  Acute onset shortness of breath. Known pancreatitis. EXAM: PORTABLE CHEST 1 VIEW COMPARISON:  One-view chest x-ray 12/26/2019 FINDINGS: Heart is enlarged. Mild interstitial pattern has increased slightly. Fluid is noted in the right minor fissure. No significant airspace consolidation is present. IMPRESSION: Cardiomegaly with slight increase in interstitial pattern, likely reflecting mild edema. No focal airspace disease or consolidation. Electronically Signed   By: San Morelle M.D.   On: 12/29/2019 04:53   DG Chest Port 1 View  Result Date: 12/26/2019 CLINICAL DATA:  Epigastric abdominal pain and vomiting. EXAM: PORTABLE CHEST 1 VIEW COMPARISON:  07/31/2019 FINDINGS: The heart is enlarged but stable. Stable tortuosity of the thoracic aorta. Stable eventration of the left hemidiaphragm with some overlying vascular crowding and atelectasis. No infiltrates or effusions. IMPRESSION: 1. Stable cardiac enlargement. 2. No acute pulmonary findings. Electronically Signed   By: Marijo Sanes M.D.   On: 12/26/2019 09:45     CBC Recent Labs  Lab 12/26/19 0818 12/27/19 0425 12/28/19 0621 12/29/19 0556 12/30/19 0709  WBC 13.2* 14.5* 16.4* 15.8* 13.0*  HGB 12.4 10.5* 9.7* 9.6* 9.5*  HCT 39.9 33.8* 32.0* 31.9* 30.8*  PLT 263 228 207 222 231  MCV 89.5 90.9 92.5 93.3 92.2  MCH 27.8 28.2 28.0 28.1 28.4  MCHC 31.1 31.1 30.3 30.1 30.8  RDW 16.1* 16.8* 17.0* 17.2* 16.9*  LYMPHSABS 1.8  --   --   --   --   MONOABS 0.6  --   --   --   --   EOSABS 0.1  --   --   --   --   BASOSABS 0.0  --   --   --   --     Chemistries  Recent Labs  Lab 12/26/19 0818 12/27/19 0425 12/28/19 0621 12/29/19 0556 12/30/19 0709  NA 139 139 141 141 140  K 4.0 4.4 4.6 4.2 4.0  CL 103 107 110 111 110  CO2 25 23 23  20* 23  GLUCOSE 345* 428* 241* 178* 82  BUN 24* 21 31* 39* 38*  CREATININE 1.31* 1.45* 1.74* 1.82* 1.63*  CALCIUM 8.7* 7.9* 8.1* 8.1* 8.2*  AST 18 12*  --   --   --   ALT 16  12  --   --   --   ALKPHOS 63 51  --   --   --   BILITOT 0.5 0.5  --   --   --    ------------------------------------------------------------------------------------------------------------------ No results for input(s): CHOL, HDL, LDLCALC, TRIG, CHOLHDL, LDLDIRECT in the last 72 hours.  Lab Results  Component Value Date   HGBA1C 8.0 (H) 07/23/2019   ------------------------------------------------------------------------------------------------------------------ No results for input(s): TSH, T4TOTAL, T3FREE, THYROIDAB in the last 72 hours.  Invalid input(s): FREET3 ------------------------------------------------------------------------------------------------------------------ No results for input(s): VITAMINB12, FOLATE, FERRITIN, TIBC, IRON, RETICCTPCT in the last 72 hours.  Coagulation profile No results for input(s): INR, PROTIME in the last  168 hours.  No results for input(s): DDIMER in the last 72 hours.  Cardiac Enzymes No results for input(s): CKMB, TROPONINI, MYOGLOBIN in the last 168 hours.  Invalid input(s): CK ------------------------------------------------------------------------------------------------------------------    Component Value Date/Time   BNP 116.0 (H) 12/26/2019 8677   Roxan Hockey M.D on 12/30/2019 at 7:57 PM  Go to [email or web address] - for contact info  [facility] - Office  [phone]

## 2019-12-30 NOTE — Progress Notes (Signed)
Patient had a moderate sized bowel movement. Has voided during shift approximately 1 liter

## 2019-12-31 DIAGNOSIS — N183 Chronic kidney disease, stage 3 unspecified: Secondary | ICD-10-CM

## 2019-12-31 LAB — GLUCOSE, CAPILLARY
Glucose-Capillary: 168 mg/dL — ABNORMAL HIGH (ref 70–99)
Glucose-Capillary: 193 mg/dL — ABNORMAL HIGH (ref 70–99)
Glucose-Capillary: 246 mg/dL — ABNORMAL HIGH (ref 70–99)
Glucose-Capillary: 262 mg/dL — ABNORMAL HIGH (ref 70–99)
Glucose-Capillary: 264 mg/dL — ABNORMAL HIGH (ref 70–99)
Glucose-Capillary: 303 mg/dL — ABNORMAL HIGH (ref 70–99)

## 2019-12-31 LAB — CBC
HCT: 29.4 % — ABNORMAL LOW (ref 36.0–46.0)
Hemoglobin: 8.9 g/dL — ABNORMAL LOW (ref 12.0–15.0)
MCH: 27.9 pg (ref 26.0–34.0)
MCHC: 30.3 g/dL (ref 30.0–36.0)
MCV: 92.2 fL (ref 80.0–100.0)
Platelets: 229 10*3/uL (ref 150–400)
RBC: 3.19 MIL/uL — ABNORMAL LOW (ref 3.87–5.11)
RDW: 16.5 % — ABNORMAL HIGH (ref 11.5–15.5)
WBC: 11.5 10*3/uL — ABNORMAL HIGH (ref 4.0–10.5)
nRBC: 0 % (ref 0.0–0.2)

## 2019-12-31 LAB — BASIC METABOLIC PANEL
Anion gap: 7 (ref 5–15)
BUN: 42 mg/dL — ABNORMAL HIGH (ref 8–23)
CO2: 24 mmol/L (ref 22–32)
Calcium: 7.9 mg/dL — ABNORMAL LOW (ref 8.9–10.3)
Chloride: 106 mmol/L (ref 98–111)
Creatinine, Ser: 1.91 mg/dL — ABNORMAL HIGH (ref 0.44–1.00)
GFR calc Af Amer: 28 mL/min — ABNORMAL LOW (ref >60)
GFR calc non Af Amer: 24 mL/min — ABNORMAL LOW (ref >60)
Glucose, Bld: 184 mg/dL — ABNORMAL HIGH (ref 70–99)
Potassium: 4 mmol/L (ref 3.5–5.1)
Sodium: 137 mmol/L (ref 135–145)

## 2019-12-31 NOTE — Progress Notes (Signed)
Patient Demographics:    [name], is a 85 y.o. female, DOB - 02-27-1935, WNU:[id number]  Admit date - 12/26/2019   Admitting Physician Mckinlee Dunk Denton Brick, MD  Outpatient Primary MD for the patient is [name], MD  LOS - 5   Chief Complaint  Patient presents with  . Abdominal Pain        Subjective:    [name] today has no fevers, ,  No chest pain,    -Oral intake is fair, no emesis -Fatigue and generalized weakness persist  Assessment  & Plan :    Principal Problem:   Pancreatitis, acute Active Problems:   New onset a-fib (HCC)   Diabetes mellitus type 2 with complications (HCC)   Chronic diastolic CHF (congestive heart failure) (HCC)/EF > 60 %   CKD (chronic kidney disease), stage IIIb  Brief Summary:- 85 y.o. female with past medical history relevant for HTN, DM, CKD 3B,dCHF ,PAFib, chronic LBBand chronic lower extremity edema admitted on 12/26/2019 with nausea vomiting the setting of acute pancreatitis similar to her presentation in March 2021  A/p 1)Acute Pancreatitis---    Pepcid, as needed antiemetics and opiates -CT abdomen pelvis consistent with acute pancreatitis -Recent lipid profile without significant triglyceridemia -Please note the patient was admitted for same back in March 2021 -Patient is status post prior cholecystectomy -Review of her medications indicated torsemide, gabapentin and simvastatin could have triggered her pancreatitis  -Lipase  1400 >> 120 >>23 -- nausea and abdominal pain improving -Patient Daughter  able to get patient to take a bit more oral intake  -Stopped IV fluids due to volume overload -Diet advanced to solids  2)PAFib--- patient with paroxysmal atrial fibrillation,CHADSVASc of 8. She is currently on oral diltiazem CD 120 mg for rate control. -continue Eliquis 2.5 mg twice daily for stroke prophylaxis  -Appears back in sinus  rhythm -Echo from 07/08/2019 with EF of 60 to 65% -Nuclear stress test from 07/18/2019 without reversible ischemia  3) chronic diastolic congestive heart failure-right-sided heart failure symptoms noted---   -Last known EF 60 to 65% -Chronic lower extremity edema,  -Developed volume overload in the setting of IV hydration for acute pancreatitis -Respiratory status and hypoxia improved with IV Lasix and diuresis -Hypoxia persist, respiratory status still not back to baseline -We will check oxygen saturation prior to discharge to sleep patient needs O2 at discharge  4)CKD stage - IIIb--- renal function appears to be stable at this time -- renally adjust medications, avoid nephrotoxic agents / dehydration / hypotension --creatinine is now up to 1.91 -May have to tolerate some worsening renal function with diuretics  5)Diabetes--- PTA patient was chronically on high-dose Levemir.  -Given persistent hypoglycemia will change IV fluids to normal saline from D5, and continue Levemir insulin further  to 60 units ---Use Novolog/Humalog Sliding scale insulin with Accu-Cheks/Fingersticks as ordered    6)HTN--- BP is not at goal, c/n  hydralazine 200 mg twice daily, continue Cardizem CD 120 mg,, c/n  isosorbide 30 mg daily added -May improve with IV diuretics  7)Abnormal imaging findings. Imaging findings suggest thyroid nodule and possible ---outpatient follow-up with PCP for further investigation/work-up strongly advised -Outpatient biopsy scheduled by Loni Beckwith patient's endocrinologist  8)Morbid obesity/possible OSA--BMI 38, diet and lifestyle modification discussed  -  Given documented history of hypercapnia patient needs outpatient sleep study  9) chronic anemia--- hemoglobin currently stable  10) acute hypoxic respiratory failure--- secondary to volume overload status in the setting of IV hydration while n.p.o. for Appetite -Hypoxia improving with IV diuresis --Hypoxia  persist, respiratory status still not back to baseline -We will check oxygen saturation prior to discharge to sleep patient needs O2 at discharge  11) generalized weakness and deconditioning----physical therapy evaluation in a.m. after improvement in hypoxia post diuresis -May need SNF rehab  12) acute urinary retention requiring in and out catheterization----Flomax as ordered,   DVT prophylaxis:Eliquis Code Status:Full code   Disposition/Need for in-Hospital Stay- patient unable to be discharged at this time due to --- acute pancreatitis requiring  IV fluids, as well as IV antiemetics and IV opiates* --Hypoxia persist, respiratory status still not back to baseline -Anticipate patient will need SNF rehab   Status is: Inpatient  Dispo: The patient is from: Home  Anticipated d/c is to: SNF  Anticipated d/c date is: 2 days Barriers: Not Clinically Stable- -acute pancreatitis , now with volume overload requiring IV diuretics, as well as IV antiemetics and IV opiates --Hypoxia persist, respiratory status still not back to baseline   Family Communication:    (patient is alert, awake and coherent)  Discussed with daughter at bedside  Consults  :  na  DVT Prophylaxis  : Eliquis  Lab Results  Component Value Date   PLT 229 12/31/2019    Inpatient Medications  Scheduled Meds: . apixaban  2.5 mg Oral BID  . diltiazem  120 mg Oral Daily  . furosemide  20 mg Intravenous Q12H  . gabapentin  300 mg Oral BID  . hydrALAZINE  100 mg Oral BID  . insulin aspart  0-5 Units Subcutaneous QHS  . insulin aspart  0-9 Units Subcutaneous TID WC  . insulin detemir  60 Units Subcutaneous QHS  . isosorbide mononitrate  30 mg Oral Daily  . polyethylene glycol  17 g Oral Daily  . senna-docusate  2 tablet Oral BID  . sodium chloride flush  3 mL Intravenous Q12H  . tamsulosin  0.4 mg Oral QPC supper   Continuous Infusions: . sodium chloride    .  famotidine (PEPCID) IV 20 mg (12/31/19 1552)   PRN Meds:.sodium chloride, acetaminophen, albuterol, HYDROmorphone (DILAUDID) injection, labetalol, ondansetron **OR** ondansetron (ZOFRAN) IV, oxyCODONE, sodium chloride flush, traZODone   Anti-infectives (From admission, onward)   None        Objective:   Vitals:   12/30/19 2018 12/31/19 0415 12/31/19 0603 12/31/19 1349  BP: (!) 163/78 (!) 152/52  (!) 127/57  Pulse: 75 74  76  Resp: 16 16  16   Temp: 98.7 F (37.1 C) 98.7 F (37.1 C)  98.2 F (36.8 C)  TempSrc: Oral   Oral  SpO2: 98% 98%  100%  Weight:   112 kg   Height:        Wt Readings from Last 3 Encounters:  12/31/19 112 kg  10/09/19 101.6 kg  08/04/19 100.5 kg     Intake/Output Summary (Last 24 hours) at 12/31/2019 1741 Last data filed at 12/31/2019 2025 Gross per 24 hour  Intake --  Output 900 ml  Net -900 ml    Physical Exam  Gen:- Awake Alert, in no acute distress HEENT:- [place].AT, No sclera icterus Nose- 2L.min Neck-Supple Neck,No JVD,.  Lungs-diminished breath sounds with bibasilar rales, no wheezing  CV- S1, S2 normal, regular  Abd-  +ve B.Sounds,  Abd Soft, mostly resolved periumbilical and epigastric area tenderness  Extremity/Skin:- 1+ve  Edema, pedal pulses present  Psych-affect is appropriate, oriented x3 Neuro-Generalized weakness without new focal deficits , no tremors   Data Review:   Micro Results Recent Results (from the past 240 hour(s))  SARS Coronavirus 2 by RT PCR (hospital order, performed in [facility] hospital lab) Nasopharyngeal Nasopharyngeal Swab     Status: None   Collection Time: 12/26/19 11:36 AM   Specimen: Nasopharyngeal Swab  Result Value Ref Range Status   SARS Coronavirus 2 NEGATIVE NEGATIVE Final    Comment: (NOTE) SARS-CoV-2 target nucleic acids are NOT DETECTED.  The SARS-CoV-2 RNA is generally detectable in upper and lower respiratory specimens during the acute phase of infection. The lowest concentration of  SARS-CoV-2 viral copies this assay can detect is 250 copies / mL. A negative result does not preclude SARS-CoV-2 infection and should not be used as the sole basis for treatment or other patient management decisions.  A negative result may occur with improper specimen collection / handling, submission of specimen other than nasopharyngeal swab, presence of viral mutation(s) within the areas targeted by this assay, and inadequate number of viral copies (<250 copies / mL). A negative result must be combined with clinical observations, patient history, and epidemiological information.  Fact Sheet for Patients:   StrictlyIdeas.no  Fact Sheet for Healthcare Providers: BankingDealers.co.za  This test is not yet approved or  cleared by the [place] FDA and has been authorized for detection and/or diagnosis of SARS-CoV-2 by FDA under an Emergency Use Authorization (EUA).  This EUA will remain in effect (meaning this test can be used) for the duration of the COVID-19 declaration under Section 564(b)(1) of the Act, 21 U.S.C. section 360bbb-3(b)(1), unless the authorization is terminated or revoked sooner.  Performed at [facility], [name], [place], [place] [address]     Radiology Reports CT Abdomen Pelvis W Contrast  Result Date: 12/26/2019 CLINICAL DATA:  PT brought in by [facility] from home c/o epigastric nagging pain with nausea that started yesterday evening. PT states she had some vomiting last night and no diarrhea. EXAM: CT ABDOMEN AND PELVIS WITH CONTRAST TECHNIQUE: Multidetector CT imaging of the abdomen and pelvis was performed using the standard protocol following bolus administration of intravenous contrast. CONTRAST:  76mL OMNIPAQUE IOHEXOL 300 MG/ML  SOLN COMPARISON:  07/23/2019 FINDINGS: Lower chest: No acute abnormality. Hepatobiliary: No focal liver abnormality is seen. Status post cholecystectomy. No biliary dilatation.  Pancreas: There is peripancreatic inflammation reflected by stranding and hazy increased attenuation in the peripancreatic fat that extends along the second and third portion of the duodenum and gastrohepatic ligament as well as the root of the small bowel mesentery. Mild inflammation is noted along the right anterior pararenal fascia. Pancreas shows homogeneous enhancement. There is irregular duct dilation, maximum 6 mm. No pancreatic mass. No collection to suggest a pseudocyst or abscess. Spleen: Normal in size without focal abnormality. Adrenals/Urinary Tract: No adrenal masses. Bilateral renal cortical thinning. Small low-density renal lesions are noted, largest anterior upper pole of the left kidney, 11 mm, consistent with cysts, and without change from the prior CT. Small nonobstructing stone in the upper pole the left kidney. Bilateral vascular calcifications. No hydronephrosis. Normal ureters. Normal bladder. Stomach/Bowel: Normal stomach. Small bowel and colon are normal in caliber. No wall thickening. No inflammation. Normal appendix visualized. Vascular/Lymphatic: Patent portal vein, splenic vein and superior mesenteric vein. Aortic and branch vessel atherosclerosis. No aneurysm. There are subcentimeter  peripancreatic and gastrohepatic ligament lymph nodes. Reproductive: Normal uterus. Bilateral tubal ligation clips, stable. No adnexal masses. Other: No ascites. There is increased attenuation in the subcutaneous fat of the lower abdomen similar to the prior CT. Small fat containing umbilical hernia. Musculoskeletal: No fracture or acute finding. No osteoblastic or osteolytic lesions. Previous right proximal femur ORIF with a compression screw. Chronic appearing avascular necrosis along the superior right femoral head. IMPRESSION: 1. Acute uncomplicated pancreatitis. No evidence of pancreatic necrosis or of an abscess or pseudocyst. No venous thrombosis. 2. No other acute abnormality within the abdomen or  pelvis. 3. Aortic atherosclerosis. Electronically Signed   By: Lajean Manes M.D.   On: 12/26/2019 13:15   DG Chest Port 1 View  Result Date: 12/29/2019 CLINICAL DATA:  Acute onset shortness of breath. Known pancreatitis. EXAM: PORTABLE CHEST 1 VIEW COMPARISON:  One-view chest x-ray 12/26/2019 FINDINGS: Heart is enlarged. Mild interstitial pattern has increased slightly. Fluid is noted in the right minor fissure. No significant airspace consolidation is present. IMPRESSION: Cardiomegaly with slight increase in interstitial pattern, likely reflecting mild edema. No focal airspace disease or consolidation. Electronically Signed   By: San Morelle M.D.   On: 12/29/2019 04:53   DG Chest Port 1 View  Result Date: 12/26/2019 CLINICAL DATA:  Epigastric abdominal pain and vomiting. EXAM: PORTABLE CHEST 1 VIEW COMPARISON:  07/31/2019 FINDINGS: The heart is enlarged but stable. Stable tortuosity of the thoracic aorta. Stable eventration of the left hemidiaphragm with some overlying vascular crowding and atelectasis. No infiltrates or effusions. IMPRESSION: 1. Stable cardiac enlargement. 2. No acute pulmonary findings. Electronically Signed   By: Marijo Sanes M.D.   On: 12/26/2019 09:45     CBC Recent Labs  Lab 12/26/19 0818 12/26/19 0818 12/27/19 0425 12/28/19 0034 12/29/19 0556 12/30/19 0709 12/31/19 0718  WBC 13.2*   < > 14.5* 16.4* 15.8* 13.0* 11.5*  HGB 12.4   < > 10.5* 9.7* 9.6* 9.5* 8.9*  HCT 39.9   < > 33.8* 32.0* 31.9* 30.8* 29.4*  PLT 263   < > 228 207 222 231 229  MCV 89.5   < > 90.9 92.5 93.3 92.2 92.2  MCH 27.8   < > 28.2 28.0 28.1 28.4 27.9  MCHC 31.1   < > 31.1 30.3 30.1 30.8 30.3  RDW 16.1*   < > 16.8* 17.0* 17.2* 16.9* 16.5*  LYMPHSABS 1.8  --   --   --   --   --   --   MONOABS 0.6  --   --   --   --   --   --   EOSABS 0.1  --   --   --   --   --   --   BASOSABS 0.0  --   --   --   --   --   --    < > = values in this interval not displayed.    Chemistries  Recent  Labs  Lab 12/26/19 0818 12/26/19 0818 12/27/19 0425 12/28/19 0621 12/29/19 0556 12/30/19 0709 12/31/19 0718  NA 139   < > 139 141 141 140 137  K 4.0   < > 4.4 4.6 4.2 4.0 4.0  CL 103   < > 107 110 111 110 106  CO2 25   < > 23 23 20* 23 24  GLUCOSE 345*   < > 428* 241* 178* 82 184*  BUN 24*   < > 21 31* 39* 38* 42*  CREATININE  1.31*   < > 1.45* 1.74* 1.82* 1.63* 1.91*  CALCIUM 8.7*   < > 7.9* 8.1* 8.1* 8.2* 7.9*  AST 18  --  12*  --   --   --   --   ALT 16  --  12  --   --   --   --   ALKPHOS 63  --  51  --   --   --   --   BILITOT 0.5  --  0.5  --   --   --   --    < > = values in this interval not displayed.   ------------------------------------------------------------------------------------------------------------------ No results for input(s): CHOL, HDL, LDLCALC, TRIG, CHOLHDL, LDLDIRECT in the last 72 hours.  Lab Results  Component Value Date   HGBA1C 8.0 (H) 07/23/2019   ------------------------------------------------------------------------------------------------------------------ No results for input(s): TSH, T4TOTAL, T3FREE, THYROIDAB in the last 72 hours.  Invalid input(s): FREET3 ------------------------------------------------------------------------------------------------------------------ No results for input(s): VITAMINB12, FOLATE, FERRITIN, TIBC, IRON, RETICCTPCT in the last 72 hours.  Coagulation profile No results for input(s): INR, PROTIME in the last 168 hours.  No results for input(s): DDIMER in the last 72 hours.  Cardiac Enzymes No results for input(s): CKMB, TROPONINI, MYOGLOBIN in the last 168 hours.  Invalid input(s): CK ------------------------------------------------------------------------------------------------------------------    Component Value Date/Time   BNP 116.0 (H) 12/26/2019 0525   Roxan Hockey M.D on 12/31/2019 at 5:41 PM  Go to [email or web address] - for contact info  [facility] - Office  [phone]

## 2020-01-01 ENCOUNTER — Inpatient Hospital Stay (HOSPITAL_COMMUNITY): Payer: Medicare HMO

## 2020-01-01 LAB — GLUCOSE, CAPILLARY
Glucose-Capillary: 175 mg/dL — ABNORMAL HIGH (ref 70–99)
Glucose-Capillary: 183 mg/dL — ABNORMAL HIGH (ref 70–99)
Glucose-Capillary: 220 mg/dL — ABNORMAL HIGH (ref 70–99)
Glucose-Capillary: 227 mg/dL — ABNORMAL HIGH (ref 70–99)
Glucose-Capillary: 232 mg/dL — ABNORMAL HIGH (ref 70–99)
Glucose-Capillary: 256 mg/dL — ABNORMAL HIGH (ref 70–99)

## 2020-01-01 LAB — BASIC METABOLIC PANEL
Anion gap: 7 (ref 5–15)
BUN: 36 mg/dL — ABNORMAL HIGH (ref 8–23)
CO2: 24 mmol/L (ref 22–32)
Calcium: 7.9 mg/dL — ABNORMAL LOW (ref 8.9–10.3)
Chloride: 106 mmol/L (ref 98–111)
Creatinine, Ser: 1.62 mg/dL — ABNORMAL HIGH (ref 0.44–1.00)
GFR calc Af Amer: 34 mL/min — ABNORMAL LOW (ref >60)
GFR calc non Af Amer: 29 mL/min — ABNORMAL LOW (ref >60)
Glucose, Bld: 209 mg/dL — ABNORMAL HIGH (ref 70–99)
Potassium: 4.1 mmol/L (ref 3.5–5.1)
Sodium: 137 mmol/L (ref 135–145)

## 2020-01-01 LAB — CBC
HCT: 29.6 % — ABNORMAL LOW (ref 36.0–46.0)
Hemoglobin: 9 g/dL — ABNORMAL LOW (ref 12.0–15.0)
MCH: 27.8 pg (ref 26.0–34.0)
MCHC: 30.4 g/dL (ref 30.0–36.0)
MCV: 91.4 fL (ref 80.0–100.0)
Platelets: 247 10*3/uL (ref 150–400)
RBC: 3.24 MIL/uL — ABNORMAL LOW (ref 3.87–5.11)
RDW: 16.4 % — ABNORMAL HIGH (ref 11.5–15.5)
WBC: 10.2 10*3/uL (ref 4.0–10.5)
nRBC: 0 % (ref 0.0–0.2)

## 2020-01-01 MED ORDER — HYDRALAZINE HCL 25 MG PO TABS
100.0000 mg | ORAL_TABLET | Freq: Three times a day (TID) | ORAL | Status: DC
  Administered 2020-01-01 – 2020-01-03 (×5): 100 mg via ORAL
  Filled 2020-01-01 (×6): qty 4

## 2020-01-01 MED ORDER — ISOSORBIDE MONONITRATE ER 60 MG PO TB24
60.0000 mg | ORAL_TABLET | Freq: Every day | ORAL | Status: DC
  Administered 2020-01-02 – 2020-01-03 (×2): 60 mg via ORAL
  Filled 2020-01-01 (×2): qty 1

## 2020-01-01 NOTE — Plan of Care (Addendum)
  Problem: Acute Rehab PT Goals(only PT should resolve) Goal: Pt Will Go Supine/Side To Sit Outcome: Progressing Flowsheets (Taken 01/01/2020 1208) Pt will go Supine/Side to Sit: with minimal assist Goal: Patient Will Transfer Sit To/From Stand Outcome: Progressing Flowsheets (Taken 01/01/2020 1208) Patient will transfer sit to/from stand: with moderate assist Goal: Pt Will Transfer Bed To Chair/Chair To Bed Outcome: Progressing Flowsheets (Taken 01/01/2020 1208) Pt will Transfer Bed to Chair/Chair to Bed: with mod assist Goal: Pt Will Ambulate Outcome: Progressing Flowsheets (Taken 01/01/2020 1208) Pt will Ambulate: . 15 feet . with moderate assist . with rolling walker   12:08 PM , 01/01/20 Karlyn Agee, SPT Physical Therapy with [facility] [phone] office    12:49 PM, 01/01/20 Mearl Latin PT, DPT Physical Therapist at [facility]

## 2020-01-01 NOTE — Progress Notes (Signed)
Inpatient Diabetes Program Recommendations  AACE/ADA: New Consensus Statement on Inpatient Glycemic Control   Target Ranges:  Prepandial:   less than 140 mg/dL      Peak postprandial:   less than 180 mg/dL (1-2 hours)      Critically ill patients:  140 - 180 mg/dL   Results for [name], [name] (MRN [id number]) as of 01/01/2020 12:04  Ref. Range 01/01/2020 00:49 01/01/2020 04:41 01/01/2020 07:56 01/01/2020 11:12  Glucose-Capillary Latest Ref Range: 70 - 99 mg/dL 256 (H) 220 (H) 175 (H) 183 (H)  Results for [name], [name] (MRN [id number]) as of 01/01/2020 12:04  Ref. Range 12/31/2019 07:37 12/31/2019 11:15 12/31/2019 16:38 12/31/2019 21:20  Glucose-Capillary Latest Ref Range: 70 - 99 mg/dL 168 (H) 303 (H) 262 (H) 264 (H)   Review of Glycemic Control  Diabetes history: DM2 Outpatient Diabetes medications: Levemir 80 units QHS, Novolog 0-10 units TID with meals Current orders for Inpatient glycemic control: Levemir 60 units QHS, Novolog 0-9 units TID with meals, Novolog 0-5 units QHS  Inpatient Diabetes Program Recommendations:    Diet: Added Carb Modified to Soft diet.  Insulin-If post prandial glucose remains consistently over 180 mg/dl, please consider ordering Novolog 3 units TID with meals for meal coverage if patient eats at least 50% meals.  Thanks, Barnie Alderman, RN, MSN, CDE Diabetes Coordinator Inpatient Diabetes Program [phone] (Team Pager from 8am to 5pm)

## 2020-01-01 NOTE — NC FL2 (Signed)
[place] LEVEL OF CARE SCREENING TOOL     IDENTIFICATION  Patient Name: [name] Birthdate: 07-15-1934 Sex: female Admission Date (Current Location): 12/26/2019  [facility] and [place] Number:  [employer] and Address:  [place] [address] [place]      Provider Number: [id number]  Attending Physician Name and Address:  Roxan Hockey, MD  Relative Name and Phone Number:       Current Level of Care: Hospital Recommended Level of Care: [place] Prior Approval Number:    Date Approved/Denied:   PASRR Number: [id number]  Discharge Plan: SNF    Current Diagnoses: Patient Active Problem List   Diagnosis Date Noted  . Acute pulmonary edema (HCC)   . AKI (acute kidney injury) ([place]) 07/25/2019  . Acute respiratory failure with hypercapnia ([place]) 07/24/2019  . Pancreatitis, acute 07/23/2019  . Diabetes mellitus type 2 with complications ([place]) 50/53/9767  . New onset a-fib ([place]) 07/23/2019  . Chronic diastolic CHF (congestive heart failure) (HCC)/EF > 60 % 07/23/2019  . Shortness of breath 06/19/2019  . Acute diastolic CHF (congestive heart failure) ([place]) 07/08/2017  . Acute respiratory failure with hypoxemia ([place]) 07/08/2017  . Uncontrolled type 2 diabetes mellitus with diabetic nephropathy, with long-term current use of insulin ([place]) 07/08/2017  . CKD (chronic kidney disease), stage IIIb 07/08/2017  . Essential hypertension 07/08/2017  . Hypertension 06/23/2017  . Prolapsed internal hemorrhoids, grade 3 06/25/2016  . Abdominal pain 05/08/2015  . Dysphagia, pharyngoesophageal phase   . Other hemorrhoids   . Diverticulosis of colon without hemorrhage   . GERD (gastroesophageal reflux disease) 02/28/2014  . Constipation 02/28/2014  . Esophageal dysphagia 02/28/2014  . Rectal bleeding 02/28/2014  . OSTEOARTHRITIS, KNEE, RIGHT, SEVERE 12/13/2008  . DEGENERATIVE DISC DISEASE, LUMBOSACRAL SPINE  W/RADICULOPATHY 12/13/2008  . BACK PAIN 12/13/2008  . DIABETES 04/13/2007  . FRACTURE, FEMUR, INTERTROCHANTERIC REGION 04/13/2007    Orientation RESPIRATION BLADDER Height & Weight     Self, Time, Situation, Place  O2 (1 L) External catheter Weight: 246 lb 4.1 oz (111.7 kg) Height:  5\' 6"  (167.6 cm)  BEHAVIORAL SYMPTOMS/MOOD NEUROLOGICAL BOWEL NUTRITION STATUS      Continent Diet (Soft diet. See d/c summary for updates.)  AMBULATORY STATUS COMMUNICATION OF NEEDS Skin   Extensive Assist Verbally Normal                       Personal Care Assistance Level of Assistance  Bathing, Dressing, Feeding Bathing Assistance: Maximum assistance Feeding assistance: Limited assistance Dressing Assistance: Maximum assistance     Functional Limitations Info  Sight, Hearing, Speech Sight Info: Adequate Hearing Info: Adequate Speech Info: Adequate    SPECIAL CARE FACTORS FREQUENCY  PT (By licensed PT)     PT Frequency: daily              Contractures      Additional Factors Info  Code Status, Allergies, Insulin Sliding Scale Code Status Info: Full code Allergies Info: No known allergies.           Current Medications (01/01/2020):  This is the current hospital active medication list Current Facility-Administered Medications  Medication Dose Route Frequency Provider Last Rate Last Admin  . 0.9 %  sodium chloride infusion  250 mL Intravenous PRN Emokpae, Courage, MD      . acetaminophen (TYLENOL) tablet 650 mg  650 mg Oral Q6H PRN Denton Brick, Courage, MD   650 mg at 01/01/20  0515  . albuterol (PROVENTIL) (2.5 MG/3ML) 0.083% nebulizer solution 2.5 mg  2.5 mg Nebulization Q2H PRN Denton Brick, Courage, MD   2.5 mg at 12/29/19 0347  . apixaban (ELIQUIS) tablet 2.5 mg  2.5 mg Oral BID Roxan Hockey, MD   2.5 mg at 01/01/20 0808  . diltiazem (CARDIZEM CD) 24 hr capsule 120 mg  120 mg Oral Daily Emokpae, Courage, MD   120 mg at 01/01/20 0808  . famotidine (PEPCID) IVPB 20 mg premix   20 mg Intravenous Q24H Emokpae, Courage, MD 100 mL/hr at 12/31/19 1552 20 mg at 12/31/19 1552  . furosemide (LASIX) injection 20 mg  20 mg Intravenous Q12H Emokpae, Courage, MD   20 mg at 01/01/20 0516  . gabapentin (NEURONTIN) capsule 300 mg  300 mg Oral BID Roxan Hockey, MD   300 mg at 01/01/20 0807  . hydrALAZINE (APRESOLINE) tablet 100 mg  100 mg Oral BID Roxan Hockey, MD   100 mg at 01/01/20 1601  . HYDROmorphone (DILAUDID) injection 0.5-1 mg  0.5-1 mg Intravenous Q2H PRN Roxan Hockey, MD   1 mg at 12/28/19 0521  . insulin aspart (novoLOG) injection 0-5 Units  0-5 Units Subcutaneous QHS Roxan Hockey, MD   3 Units at 12/31/19 2344  . insulin aspart (novoLOG) injection 0-9 Units  0-9 Units Subcutaneous TID WC Roxan Hockey, MD   2 Units at 01/01/20 1213  . insulin detemir (LEVEMIR) injection 60 Units  60 Units Subcutaneous QHS Roxan Hockey, MD   60 Units at 12/31/19 2346  . isosorbide mononitrate (IMDUR) 24 hr tablet 30 mg  30 mg Oral Daily Emokpae, Courage, MD   30 mg at 01/01/20 0808  . labetalol (NORMODYNE) injection 10 mg  10 mg Intravenous Q4H PRN Emokpae, Courage, MD      . ondansetron (ZOFRAN) tablet 4 mg  4 mg Oral Q6H PRN Denton Brick, Courage, MD   4 mg at 12/27/19 2041   Or  . ondansetron (ZOFRAN) injection 4 mg  4 mg Intravenous Q6H PRN Roxan Hockey, MD   4 mg at 12/28/19 2204  . oxyCODONE (Oxy IR/ROXICODONE) immediate release tablet 5 mg  5 mg Oral Q4H PRN Denton Brick, Courage, MD   5 mg at 12/31/19 1351  . polyethylene glycol (MIRALAX / GLYCOLAX) packet 17 g  17 g Oral Daily Denton Brick, Courage, MD   17 g at 01/01/20 0932  . senna-docusate (Senokot-S) tablet 2 tablet  2 tablet Oral BID Roxan Hockey, MD   2 tablet at 01/01/20 0808  . sodium chloride flush (NS) 0.9 % injection 3 mL  3 mL Intravenous Q12H Emokpae, Courage, MD   3 mL at 01/01/20 0811  . sodium chloride flush (NS) 0.9 % injection 3 mL  3 mL Intravenous PRN Emokpae, Courage, MD      . tamsulosin  (FLOMAX) capsule 0.4 mg  0.4 mg Oral QPC supper Denton Brick, Courage, MD   0.4 mg at 12/31/19 1740  . traZODone (DESYREL) tablet 50 mg  50 mg Oral QHS PRN Roxan Hockey, MD       Facility-Administered Medications Ordered in Other Encounters  Medication Dose Route Frequency Provider Last Rate Last Admin  . regadenoson (LEXISCAN) injection SOLN 0.4 mg  0.4 mg Intravenous Once Croitoru, Mihai, MD         Discharge Medications: Please see discharge summary for a list of discharge medications.  Relevant Imaging Results:  Relevant Lab Results:   Additional Information SSN: [id number]. Pt did not receive COVID vaccines.  Salome Arnt, LCSW

## 2020-01-01 NOTE — Care Management Important Message (Signed)
Important Message  Patient Details  Name: [name] MRN: [id number] Date of Birth: 03-17-1935   Medicare Important Message Given:  Yes     Tommy Medal 01/01/2020, 3:04 PM

## 2020-01-01 NOTE — Progress Notes (Signed)
Patient Demographics:    [name], is a 85 y.o. female, DOB - 02-Aug-1934, YOV:[id number]  Admit date - 12/26/2019   Admitting Physician Naomie Crow Denton Brick, MD  Outpatient Primary MD for the patient is [name], MD  LOS - 6   Chief Complaint  Patient presents with  . Abdominal Pain        Subjective:    [name] today has no fevers, ,  No chest pain,   -Oral intake is fair, no emesis -Fatigue and generalized weakness persist -Daughter at bedside,  -physical therapist at bedside, patient is very weak--- and unsteady  Assessment  & Plan :    Principal Problem:   Pancreatitis, acute Active Problems:   New onset a-fib ([name])   Diabetes mellitus type 2 with complications ([name])   Chronic diastolic CHF (congestive heart failure) (HCC)/EF > 60 %   CKD (chronic kidney disease), stage IIIb  Brief Summary:- 85 y.o. female with past medical history relevant for HTN, DM, CKD 3B,dCHF ,PAFib, chronic LBBand chronic lower extremity edema admitted on 12/26/2019 with nausea vomiting the setting of acute pancreatitis similar to her presentation in March 2021 -Patient is weak and unsteady physical therapist recommends SNF rehab  A/p 1)Acute Pancreatitis---    Pepcid, as needed antiemetics and opiates -CT abdomen pelvis consistent with acute pancreatitis -Recent lipid profile without significant triglyceridemia -Please note the patient was admitted for same back in March 2021 -Patient is status post prior cholecystectomy -Review of her medications indicated torsemide, gabapentin and simvastatin could have triggered her pancreatitis  -Lipase  1400 >> 120 >>23 -Stopped IV fluids due to volume overload -Diet advanced to solids  2)PAFib--- patient with paroxysmal atrial fibrillation,CHADSVASc of 8. She is currently on oral diltiazem CD 120 mg for rate control. -continue Eliquis 2.5 mg twice daily for  stroke prophylaxis  -Appears back in sinus rhythm -Echo from 07/08/2019 with EF of 60 to 65% -Nuclear stress test from 07/18/2019 without reversible ischemia  3) chronic diastolic congestive heart failure-right-sided heart failure symptoms noted---   -Last known EF 60 to 65% -Chronic lower extremity edema,  -Developed volume overload in the setting of IV hydration for acute pancreatitis -Respiratory status and hypoxia improved with IV Lasix and diuresis -Hypoxia persist, respiratory status still not back to baseline -We will check oxygen saturation prior to discharge to see if  patient needs O2 at discharge  4)CKD stage - IIIb--- renal function appears to be stable at this time -- renally adjust medications, avoid nephrotoxic agents / dehydration / hypotension --creatinine is now up to 1.91>>> 1.62 -May have to tolerate some worsening renal function with diuretics  5)Diabetes--- PTA patient was chronically on high-dose Levemir.  -Given persistent hypoglycemia will change IV fluids to normal saline from D5, and continue Levemir insulin further  to 60 units ---Use Novolog/Humalog Sliding scale insulin with Accu-Cheks/Fingersticks as ordered    6)HTN--- BP is not at goal, increase hydralazine to 100mg  TID,  continue Cardizem CD 120 mg,, increase isosorbide to 60 mg daily  --iv Labetalol as needed  7)Abnormal imaging findings. Imaging findings suggest thyroid nodule and possible ---outpatient follow-up with PCP for further investigation/work-up strongly advised -Outpatient biopsy scheduled by Loni Beckwith patient's endocrinologist  8)Morbid obesity/possible OSA--BMI 38, diet and  lifestyle modification discussed  -Given documented history of hypercapnia patient needs outpatient sleep study  9) chronic anemia---  suspect some degree of anemia of CKD,  ---hemoglobin currently stable around 9  10) acute hypoxic respiratory failure--- secondary to volume overload status in  the setting of IV hydration while n.p.o. for Appetite -Hypoxia improving with IV diuresis --Hypoxia persist, respiratory status still not back to baseline -We will check oxygen saturation prior to discharge to sleep patient needs O2 at discharge  11) generalized weakness and deconditioning----physical therapy evaluation in a.m. after improvement in hypoxia post diuresis --Patient is weak and unsteady physical therapist recommends SNF rehab  12)Acute urinary retention requiring in and out catheterization----Flomax as ordered --Patient voiding okay at this time  DVT prophylaxis:Eliquis Code Status:Full code  Disposition/Need for in-Hospital Stay- patient unable to be discharged at this time due to --- --Patient is weak and unsteady physical therapist recommends SNF rehab --May need oxygen upon discharge  Status is: Inpatient  Dispo: The patient is from: Home  Anticipated d/c is to: SNF  Anticipated d/c date is: 2 days Barriers: ---Patient is weak and unsteady physical therapist recommends SNF rehab --May need oxygen upon discharge  Family Communication:    (patient is alert, awake and coherent)  Discussed with daughter at bedside  Consults  :  na  DVT Prophylaxis  : Eliquis  Lab Results  Component Value Date   PLT 247 01/01/2020    Inpatient Medications  Scheduled Meds: . apixaban  2.5 mg Oral BID  . diltiazem  120 mg Oral Daily  . furosemide  20 mg Intravenous Q12H  . gabapentin  300 mg Oral BID  . hydrALAZINE  100 mg Oral TID  . insulin aspart  0-5 Units Subcutaneous QHS  . insulin aspart  0-9 Units Subcutaneous TID WC  . insulin detemir  60 Units Subcutaneous QHS  . [START ON 01/02/2020] isosorbide mononitrate  60 mg Oral Daily  . polyethylene glycol  17 g Oral Daily  . senna-docusate  2 tablet Oral BID  . sodium chloride flush  3 mL Intravenous Q12H  . tamsulosin  0.4 mg Oral QPC supper   Continuous Infusions: . sodium  chloride    . famotidine (PEPCID) IV 20 mg (12/31/19 1552)   PRN Meds:.sodium chloride, acetaminophen, albuterol, HYDROmorphone (DILAUDID) injection, labetalol, ondansetron **OR** ondansetron (ZOFRAN) IV, oxyCODONE, sodium chloride flush, traZODone   Anti-infectives (From admission, onward)   None       Objective:   Vitals:   01/01/20 0500 01/01/20 0538 01/01/20 1155 01/01/20 1434  BP:  (!) 158/60  (!) 182/56  Pulse:  73  82  Resp:  18  20  Temp:  97.6 F (36.4 C)  98.5 F (36.9 C)  TempSrc:  Oral    SpO2:  100% 93% 97%  Weight: 111.7 kg     Height:        Wt Readings from Last 3 Encounters:  01/01/20 111.7 kg  10/09/19 101.6 kg  08/04/19 100.5 kg    Intake/Output Summary (Last 24 hours) at 01/01/2020 1707 Last data filed at 01/01/2020 0900 Gross per 24 hour  Intake 240 ml  Output 1950 ml  Net -1710 ml   Physical Exam Gen:- Awake Alert, in no acute distress HEENT:- [place].AT, No sclera icterus Nose- 2L.min Neck-Supple Neck,No JVD,.  Lungs- improving breath sounds with bibasilar rales, no wheezing  CV- S1, S2 normal, regular  Abd-  +ve B.Sounds, Abd Soft, mostly resolved periumbilical and epigastric area tenderness  Extremity/Skin:- 1+ve  Edema, pedal pulses present  Psych-affect is appropriate, oriented x3 Neuro-Generalized weakness without new focal deficits , no tremors   Data Review:   Micro Results Recent Results (from the past 240 hour(s))  SARS Coronavirus 2 by RT PCR (hospital order, performed in [facility] hospital lab) Nasopharyngeal Nasopharyngeal Swab     Status: None   Collection Time: 12/26/19 11:36 AM   Specimen: Nasopharyngeal Swab  Result Value Ref Range Status   SARS Coronavirus 2 NEGATIVE NEGATIVE Final    Comment: (NOTE) SARS-CoV-2 target nucleic acids are NOT DETECTED.  The SARS-CoV-2 RNA is generally detectable in upper and lower respiratory specimens during the acute phase of infection. The lowest concentration of SARS-CoV-2 viral  copies this assay can detect is 250 copies / mL. A negative result does not preclude SARS-CoV-2 infection and should not be used as the sole basis for treatment or other patient management decisions.  A negative result may occur with improper specimen collection / handling, submission of specimen other than nasopharyngeal swab, presence of viral mutation(s) within the areas targeted by this assay, and inadequate number of viral copies (<250 copies / mL). A negative result must be combined with clinical observations, patient history, and epidemiological information.  Fact Sheet for Patients:   StrictlyIdeas.no  Fact Sheet for Healthcare Providers: BankingDealers.co.za  This test is not yet approved or  cleared by the [place] FDA and has been authorized for detection and/or diagnosis of SARS-CoV-2 by FDA under an Emergency Use Authorization (EUA).  This EUA will remain in effect (meaning this test can be used) for the duration of the COVID-19 declaration under Section 564(b)(1) of the Act, 21 U.S.C. section 360bbb-3(b)(1), unless the authorization is terminated or revoked sooner.  Performed at [facility], [address] [place], [place] [address]     Radiology Reports CT Abdomen Pelvis W Contrast  Result Date: 12/26/2019 CLINICAL DATA:  PT brought in by [facility] from home c/o epigastric nagging pain with nausea that started yesterday evening. PT states she had some vomiting last night and no diarrhea. EXAM: CT ABDOMEN AND PELVIS WITH CONTRAST TECHNIQUE: Multidetector CT imaging of the abdomen and pelvis was performed using the standard protocol following bolus administration of intravenous contrast. CONTRAST:  82mL OMNIPAQUE IOHEXOL 300 MG/ML  SOLN COMPARISON:  07/23/2019 FINDINGS: Lower chest: No acute abnormality. Hepatobiliary: No focal liver abnormality is seen. Status post cholecystectomy. No biliary dilatation. Pancreas: There is  peripancreatic inflammation reflected by stranding and hazy increased attenuation in the peripancreatic fat that extends along the second and third portion of the duodenum and gastrohepatic ligament as well as the root of the small bowel mesentery. Mild inflammation is noted along the right anterior pararenal fascia. Pancreas shows homogeneous enhancement. There is irregular duct dilation, maximum 6 mm. No pancreatic mass. No collection to suggest a pseudocyst or abscess. Spleen: Normal in size without focal abnormality. Adrenals/Urinary Tract: No adrenal masses. Bilateral renal cortical thinning. Small low-density renal lesions are noted, largest anterior upper pole of the left kidney, 11 mm, consistent with cysts, and without change from the prior CT. Small nonobstructing stone in the upper pole the left kidney. Bilateral vascular calcifications. No hydronephrosis. Normal ureters. Normal bladder. Stomach/Bowel: Normal stomach. Small bowel and colon are normal in caliber. No wall thickening. No inflammation. Normal appendix visualized. Vascular/Lymphatic: Patent portal vein, splenic vein and superior mesenteric vein. Aortic and branch vessel atherosclerosis. No aneurysm. There are subcentimeter peripancreatic and gastrohepatic ligament lymph nodes. Reproductive: Normal uterus. Bilateral  tubal ligation clips, stable. No adnexal masses. Other: No ascites. There is increased attenuation in the subcutaneous fat of the lower abdomen similar to the prior CT. Small fat containing umbilical hernia. Musculoskeletal: No fracture or acute finding. No osteoblastic or osteolytic lesions. Previous right proximal femur ORIF with a compression screw. Chronic appearing avascular necrosis along the superior right femoral head. IMPRESSION: 1. Acute uncomplicated pancreatitis. No evidence of pancreatic necrosis or of an abscess or pseudocyst. No venous thrombosis. 2. No other acute abnormality within the abdomen or pelvis. 3. Aortic  atherosclerosis. Electronically Signed   By: Lajean Manes M.D.   On: 12/26/2019 13:15   DG Chest Port 1 View  Result Date: 01/01/2020 CLINICAL DATA:  Short of breath, diabetes, negative COVID-19 EXAM: PORTABLE CHEST 1 VIEW COMPARISON:  May 13 21 FINDINGS: Single frontal view of the chest demonstrates persistent enlargement of the cardiac silhouette. Mild diffuse interstitial prominence without focal consolidation, effusion, or pneumothorax. Subsegmental atelectasis right upper lobe. No acute bony abnormalities. IMPRESSION: 1. Stable enlarged cardiac silhouette and interstitial prominence consistent with interstitial edema. Electronically Signed   By: Randa Ngo M.D.   On: 01/01/2020 16:11   DG Chest Port 1 View  Result Date: 12/29/2019 CLINICAL DATA:  Acute onset shortness of breath. Known pancreatitis. EXAM: PORTABLE CHEST 1 VIEW COMPARISON:  One-view chest x-ray 12/26/2019 FINDINGS: Heart is enlarged. Mild interstitial pattern has increased slightly. Fluid is noted in the right minor fissure. No significant airspace consolidation is present. IMPRESSION: Cardiomegaly with slight increase in interstitial pattern, likely reflecting mild edema. No focal airspace disease or consolidation. Electronically Signed   By: San Morelle M.D.   On: 12/29/2019 04:53   DG Chest Port 1 View  Result Date: 12/26/2019 CLINICAL DATA:  Epigastric abdominal pain and vomiting. EXAM: PORTABLE CHEST 1 VIEW COMPARISON:  07/31/2019 FINDINGS: The heart is enlarged but stable. Stable tortuosity of the thoracic aorta. Stable eventration of the left hemidiaphragm with some overlying vascular crowding and atelectasis. No infiltrates or effusions. IMPRESSION: 1. Stable cardiac enlargement. 2. No acute pulmonary findings. Electronically Signed   By: Marijo Sanes M.D.   On: 12/26/2019 09:45     CBC Recent Labs  Lab 12/26/19 0818 12/27/19 0425 12/28/19 0621 12/29/19 0556 12/30/19 0709 12/31/19 0718 01/01/20 0530    WBC 13.2*   < > 16.4* 15.8* 13.0* 11.5* 10.2  HGB 12.4   < > 9.7* 9.6* 9.5* 8.9* 9.0*  HCT 39.9   < > 32.0* 31.9* 30.8* 29.4* 29.6*  PLT 263   < > 207 222 231 229 247  MCV 89.5   < > 92.5 93.3 92.2 92.2 91.4  MCH 27.8   < > 28.0 28.1 28.4 27.9 27.8  MCHC 31.1   < > 30.3 30.1 30.8 30.3 30.4  RDW 16.1*   < > 17.0* 17.2* 16.9* 16.5* 16.4*  LYMPHSABS 1.8  --   --   --   --   --   --   MONOABS 0.6  --   --   --   --   --   --   EOSABS 0.1  --   --   --   --   --   --   BASOSABS 0.0  --   --   --   --   --   --    < > = values in this interval not displayed.    Chemistries  Recent Labs  Lab 12/26/19 0818 12/26/19 0818 12/27/19 0425 12/27/19  0425 12/28/19 5462 12/29/19 0556 12/30/19 0709 12/31/19 0718 01/01/20 0530  NA 139   < > 139   < > 141 141 140 137 137  K 4.0   < > 4.4   < > 4.6 4.2 4.0 4.0 4.1  CL 103   < > 107   < > 110 111 110 106 106  CO2 25   < > 23   < > 23 20* 23 24 24   GLUCOSE 345*   < > 428*   < > 241* 178* 82 184* 209*  BUN 24*   < > 21   < > 31* 39* 38* 42* 36*  CREATININE 1.31*   < > 1.45*   < > 1.74* 1.82* 1.63* 1.91* 1.62*  CALCIUM 8.7*   < > 7.9*   < > 8.1* 8.1* 8.2* 7.9* 7.9*  AST 18  --  12*  --   --   --   --   --   --   ALT 16  --  12  --   --   --   --   --   --   ALKPHOS 63  --  51  --   --   --   --   --   --   BILITOT 0.5  --  0.5  --   --   --   --   --   --    < > = values in this interval not displayed.   ------------------------------------------------------------------------------------------------------------------ No results for input(s): CHOL, HDL, LDLCALC, TRIG, CHOLHDL, LDLDIRECT in the last 72 hours.  Lab Results  Component Value Date   HGBA1C 8.0 (H) 07/23/2019   ------------------------------------------------------------------------------------------------------------------ No results for input(s): TSH, T4TOTAL, T3FREE, THYROIDAB in the last 72 hours.  Invalid input(s):  FREET3 ------------------------------------------------------------------------------------------------------------------ No results for input(s): VITAMINB12, FOLATE, FERRITIN, TIBC, IRON, RETICCTPCT in the last 72 hours.  Coagulation profile No results for input(s): INR, PROTIME in the last 168 hours.  No results for input(s): DDIMER in the last 72 hours.  Cardiac Enzymes No results for input(s): CKMB, TROPONINI, MYOGLOBIN in the last 168 hours.  Invalid input(s): CK ------------------------------------------------------------------------------------------------------------------    Component Value Date/Time   BNP 116.0 (H) 12/26/2019 7035   Roxan Hockey M.D on 01/01/2020 at 5:07 PM  Go to [email or web address] - for contact info  [facility] - Office  [phone]

## 2020-01-01 NOTE — TOC Initial Note (Signed)
Transition of Care [facility]) - Initial/Assessment Note    Patient Details  Name: [name] MRN: [id number] Date of Birth: 03-Sep-1934  Transition of Care [facility]) CM/SW Contact:    Salome Arnt, Jeff Phone Number: 01/01/2020, 3:31 PM  Clinical Narrative:   Pt admitted from home due to acute pancreatitis. Her son and grandson live with her. At baseline, pt indicates she ambulates with a walker and is fairly independent. Pt admits to being very weak after one week hospital stay. PT evaluated pt and recommend SNF. Pt has been to [facility] in the past and requests to return there. Pt aware of need for insurance authorization. Per Jackelyn Poling at [facility] [place], facility can accept and will start authorization today. Pt notified and agreeable. TOC will follow.                 Expected Discharge Plan: Skilled Nursing Facility Barriers to Discharge: Continued Medical Work up, Ship broker   Patient Goals and CMS Choice Patient states their goals for this hospitalization and ongoing recovery are:: short term rehab prior to return home CMS Medicare.gov Compare Post Acute Care list provided to:: Patient Choice offered to / list presented to : Patient  Expected Discharge Plan and Services Expected Discharge Plan: [place] In-house Referral: Clinical Social Work   Post Acute Care Choice: [place] Living arrangements for the past 2 months: [facility]                                      Prior Living Arrangements/Services Living arrangements for the past 2 months: Single Family Home Lives with:: Adult Children Patient language and need for interpreter reviewed:: Yes Do you feel safe going back to the place where you live?: Yes      Need for Family Participation in Patient Care: No (Comment) Care giver support system in place?: Yes (comment) Current home services: DME (walker, 3N1) Criminal Activity/Legal Involvement Pertinent to Current  Situation/Hospitalization: No - Comment as needed  Activities of Daily Living Home Assistive Devices/Equipment: None ADL Screening (condition at time of admission) Patient's cognitive ability adequate to safely complete daily activities?: Yes Is the patient deaf or have difficulty hearing?: No Does the patient have difficulty seeing, even when wearing glasses/contacts?: No Does the patient have difficulty concentrating, remembering, or making decisions?: No Patient able to express need for assistance with ADLs?: Yes Does the patient have difficulty dressing or bathing?: No Independently performs ADLs?: Yes (appropriate for developmental age) Does the patient have difficulty walking or climbing stairs?: No Weakness of Legs: Both Weakness of Arms/Hands: None  Permission Sought/Granted         Permission granted to share info w AGENCY: [facility] granted to share info w Relationship: SNF     Emotional Assessment Appearance:: Appears stated age Attitude/Demeanor/Rapport: Engaged Affect (typically observed): Accepting Orientation: : Oriented to Self, Oriented to Place, Oriented to  Time, Oriented to Situation Alcohol / Substance Use: Not Applicable Psych Involvement: No (comment)  Admission diagnosis:  Pancreatitis [K85.90] Essential hypertension [I10] Acute pancreatitis without infection or necrosis, unspecified pancreatitis type [K85.90] Patient Active Problem List   Diagnosis Date Noted  . Acute pulmonary edema (HCC)   . AKI (acute kidney injury) ([place]) 07/25/2019  . Acute respiratory failure with hypercapnia ([place]) 07/24/2019  . Pancreatitis, acute 07/23/2019  . Diabetes mellitus type 2 with complications ([place]) 66/10/3014  . New onset  a-fib ([place]) 07/23/2019  . Chronic diastolic CHF (congestive heart failure) (HCC)/EF > 60 % 07/23/2019  . Shortness of breath 06/19/2019  . Acute diastolic CHF (congestive heart failure) ([place]) 07/08/2017  . Acute respiratory failure  with hypoxemia ([place]) 07/08/2017  . Uncontrolled type 2 diabetes mellitus with diabetic nephropathy, with long-term current use of insulin ([place]) 07/08/2017  . CKD (chronic kidney disease), stage IIIb 07/08/2017  . Essential hypertension 07/08/2017  . Hypertension 06/23/2017  . Prolapsed internal hemorrhoids, grade 3 06/25/2016  . Abdominal pain 05/08/2015  . Dysphagia, pharyngoesophageal phase   . Other hemorrhoids   . Diverticulosis of colon without hemorrhage   . GERD (gastroesophageal reflux disease) 02/28/2014  . Constipation 02/28/2014  . Esophageal dysphagia 02/28/2014  . Rectal bleeding 02/28/2014  . OSTEOARTHRITIS, KNEE, RIGHT, SEVERE 12/13/2008  . DEGENERATIVE DISC DISEASE, LUMBOSACRAL SPINE W/RADICULOPATHY 12/13/2008  . BACK PAIN 12/13/2008  . DIABETES 04/13/2007  . FRACTURE, FEMUR, INTERTROCHANTERIC REGION 04/13/2007   PCP:  [name], MD Pharmacy:   [facility], [place] [place] [address] Phone: [phone] Fax: [phone]     Social Determinants of Health (SDOH) Interventions    Readmission Risk Interventions Readmission Risk Prevention Plan 07/27/2019  Transportation Screening Complete  Medication Review (RN CM) Complete  Some recent data might be hidden

## 2020-01-01 NOTE — Evaluation (Addendum)
Physical Therapy Evaluation Patient Details Name: [name] MRN: [id number] DOB: 1935/03/20 Today's Date: 01/01/2020   History of Present Illness  [name]  is a 85 y.o. female with past medical history relevant for HTN, DM, CKD 3B, dCHF , PAFib, chronic LBB and chronic lower extremity edema who presents to the ED with epigastric pain associated with nausea and vomiting-Emesis is without blood or bile-Patient denies diarrhea, no fevers no chillsDenies dyspnea,-She does have chronic lower extremity edema -In ED patient found to have findings of acute pancreatitis -CT abdomen and pelvis consistent with acute pancreatitis, lipase is found to be 1,444, LFTs are not elevated-WBC is 13.2 with hemoglobin of 12.4-Creatinine is 1.31 which is actually better than recent baseline, -UA appears colonized, with glucosuria and proteinuria-Chest x-ray without acute findings-No significant elevation of troponin, BNP 116    Clinical Impression   On room air the patient's O2 was 93%, compared with 95% on 1L O2. The patient was able to perform supine to sit transfer with mod assist and no SOB symptoms. With sit to stand transfer patient had generalized LE weakness and required extensive VC's to utilize walker for BUE support. The LE weakness increased throughout stand pivot transfer and upon sitting the patient in the chair, she was tachypnic with O2 92%. The patient and daughter report this is not her usual baseline and that she was previously transferring independent with assistive devices as a household ambulator although she did have LLE weakness from a previous stroke. Patient tolerated sitting up in chair with daughter present at bedside after therapy. PLAN: The patient will continue to benefit from skilled physical therapy services in hospital at recommended venue below in order to improve balance, gait, and ADL's to promote independence in functional activities.       Follow Up Recommendations SNF     Equipment Recommendations    None recommended by PT.    Recommendations for Other Services   None recommended by PT.      Precautions / Restrictions Precautions Precautions: Fall Restrictions Weight Bearing Restrictions: No      Mobility  Bed Mobility Overal bed mobility: Needs Assistance Bed Mobility: Supine to Sit     Supine to sit: Mod assist        Transfers Overall transfer level: Needs assistance Equipment used: Rolling walker (2 wheeled) Transfers: Sit to/from [employer] Transfers Sit to Stand: Max assist;Mod assist Stand pivot transfers: Max assist       General transfer comment: weakness increased with length of time in standing position  Ambulation/Gait Ambulation/Gait assistance: Max assist Gait Distance (Feet): 3 Feet Assistive device: Rolling walker (2 wheeled) Gait Pattern/deviations: Step-to pattern;Decreased stride length;Trunk flexed;Narrow base of support        Stairs            Wheelchair Mobility    Modified Rankin (Stroke Patients Only)       Balance Overall balance assessment: Needs assistance Sitting-balance support: No upper extremity supported;Feet supported Sitting balance-Leahy Scale: Good   Postural control: Posterior lean Standing balance support: Bilateral upper extremity supported;During functional activity Standing balance-Leahy Scale: Poor                               Pertinent Vitals/Pain Pain Assessment: No/denies pain    Home Living Family/patient expects to be discharged to:: Private residence Living Arrangements: Children Available Help at Discharge: Family;Available PRN/intermittently Type of Home: House Home Access: Stairs to  enter Entrance Stairs-Rails: Right Entrance Stairs-Number of Steps: 3 Home Layout: One level Home Equipment: Walker - 2 wheels;Bedside commode;Wheelchair - Brewing technologist;Toilet riser Additional Comments: sleeps in recliner, gets up from [facility]  elevated position    Prior Function Level of Independence: Independent with assistive device(s)         Comments: household ambulator with RW     Hand Dominance   Dominant Hand: Right    Extremity/Trunk Assessment   Upper Extremity Assessment Upper Extremity Assessment: Overall WFL for tasks assessed    Lower Extremity Assessment Lower Extremity Assessment: Generalized weakness    Cervical / Trunk Assessment Cervical / Trunk Assessment: Normal  Communication   Communication: No difficulties  Cognition Arousal/Alertness: Awake/alert Behavior During Therapy: WFL for tasks assessed/performed Overall Cognitive Status: Within Functional Limits for tasks assessed                                        General Comments      Exercises     Assessment/Plan    PT Assessment Patient needs continued PT services  PT Problem List Decreased strength;Decreased range of motion;Decreased knowledge of use of DME;Decreased activity tolerance;Decreased balance;Decreased mobility;Cardiopulmonary status limiting activity;Obesity       PT Treatment Interventions DME instruction;Balance training;Gait training;Functional mobility training;Therapeutic activities;Therapeutic exercise;Patient/family education    PT Goals (Current goals can be found in the Care Plan section)  Acute Rehab PT Goals Patient Stated Goal: home PT Goal Formulation: With patient/family Time For Goal Achievement: 01/15/20 Potential to Achieve Goals: Good    Frequency Min 3X/week   Barriers to discharge        Co-evaluation               AM-PAC PT "6 Clicks" Mobility  Outcome Measure Help needed turning from your back to your side while in a flat bed without using bedrails?: A Little Help needed moving from lying on your back to sitting on the side of a flat bed without using bedrails?: A Little Help needed moving to and from a bed to a chair (including a wheelchair)?: A Lot Help  needed standing up from a chair using your arms (e.g., wheelchair or bedside chair)?: A Lot Help needed to walk in hospital room?: A Lot Help needed climbing 3-5 steps with a railing? : Total 6 Click Score: 13    End of Session Equipment Utilized During Treatment: Gait belt Activity Tolerance: Patient limited by fatigue Patient left: in chair;with call bell/phone within reach;with family/visitor present Nurse Communication: Mobility status PT Visit Diagnosis: Muscle weakness (generalized) (M62.81);Unsteadiness on feet (R26.81);Other abnormalities of gait and mobility (R26.89);Difficulty in walking, not elsewhere classified (R26.2)    Time: 7001-7494 PT Time Calculation (min) (ACUTE ONLY): 30 min   Charges:   PT Evaluation $PT Eval Moderate Complexity: 1 Mod PT Treatments $Therapeutic Activity: 8-22 mins        12:48 PM , 01/01/20 Karlyn Agee, SPT Physical Therapy with [facility] [phone] office      This qualified practitioner was present in the room guiding the student in service delivery. Therapy student was participating in the provision of services, and the practitioner was not engaged in treating another patient or doing other tasks at the same time. 12:49 PM, 01/01/20 Mearl Latin PT, DPT Physical Therapist at [facility]

## 2020-01-02 LAB — GLUCOSE, CAPILLARY
Glucose-Capillary: 125 mg/dL — ABNORMAL HIGH (ref 70–99)
Glucose-Capillary: 209 mg/dL — ABNORMAL HIGH (ref 70–99)
Glucose-Capillary: 212 mg/dL — ABNORMAL HIGH (ref 70–99)
Glucose-Capillary: 220 mg/dL — ABNORMAL HIGH (ref 70–99)
Glucose-Capillary: 233 mg/dL — ABNORMAL HIGH (ref 70–99)

## 2020-01-02 MED ORDER — FUROSEMIDE 20 MG PO TABS
20.0000 mg | ORAL_TABLET | Freq: Every day | ORAL | Status: DC
  Administered 2020-01-03: 20 mg via ORAL
  Filled 2020-01-02: qty 1

## 2020-01-02 MED ORDER — LABETALOL HCL 5 MG/ML IV SOLN
10.0000 mg | Freq: Once | INTRAVENOUS | Status: AC
  Administered 2020-01-02: 10 mg via INTRAVENOUS
  Filled 2020-01-02: qty 4

## 2020-01-02 NOTE — Progress Notes (Signed)
Physical Therapy Treatment Patient Details Name: [name] MRN: [id number] DOB: 02-12-1935 Today's Date: 01/02/2020    History of Present Illness [name]  is a 85 y.o. female with past medical history relevant for HTN, DM, CKD 3B, dCHF , PAFib, chronic LBB and chronic lower extremity edema who presents to the ED with epigastric pain associated with nausea and vomiting-Emesis is without blood or bile-Patient denies diarrhea, no fevers no chillsDenies dyspnea,-She does have chronic lower extremity edema -In ED patient found to have findings of acute pancreatitis -CT abdomen and pelvis consistent with acute pancreatitis, lipase is found to be 1,444, LFTs are not elevated-WBC is 13.2 with hemoglobin of 12.4-Creatinine is 1.31 which is actually better than recent baseline, -UA appears colonized, with glucosuria and proteinuria-Chest x-ray without acute findings-No significant elevation of troponin, BNP 116    PT Comments    Upon arrival, pt soiled in urine and NT going to change linen. Pt cued to come to sitting EOB with HOB elevated and cues for hand placement, requiring mod A to bring BLE to EOB and to upright trunk. Pt performs STS reps from EOB requiring max A x2 with RW, demonstrating slow, labored movement, able to clear bottom from bed ~2 inches, but unable to fully extend hips/trunk and instead leans upper body on RW for support. Pt tolerates standing ~20-30 seconds before requiring seated rest break. Pt demonstrates R lateral lean in standing, minimal improvement when cued. Pt unable to perform stand pivot transfer due to generalized weakness and fatigues with multiple STS reps and static standing. Pt transferred back to supine and scooted up in bed. Pt left supine in bed with daughter in room, on bed pan and NT educated pt to call for assist once finished with bedpan. Patient will benefit from continued physical therapy in hospital and recommendations below to increase strength, balance,  endurance for safe ADLs and gait.    Follow Up Recommendations  SNF     Equipment Recommendations  None recommended by PT    Recommendations for Other Services       Precautions / Restrictions Precautions Precautions: Fall Restrictions Weight Bearing Restrictions: No    Mobility  Bed Mobility Overal bed mobility: Needs Assistance Bed Mobility: Supine to Sit;Sit to Supine;Rolling Rolling: Mod assist   Supine to sit: Mod assist;HOB elevated Sit to supine: Mod assist   General bed mobility comments: slow labored movement with multimodal cues; mod A to bring BLE over to EOB and upright trunk, mod A to scoot out to EOB to place feet flat on floor; mod A to return BLE back into bed and max A to scoot up in bed cued for hooklying position to push through bil flat feet to assist in scooting up in bed; mod A to roll in bed while NT repositions linen and bedpan  Transfers Overall transfer level: Needs assistance Equipment used: Rolling walker (2 wheeled) Transfers: Sit to/from Stand Sit to Stand: Max assist;+2 physical assistance;From elevated surface  General transfer comment: max A x2 to stand from elevated EOB, able to perform 3 reps and clear bottom ~2 inches from bed but unable to fully upright trunk/hips and demonstrates R lateral lean with trunk flexed over RW; pt tolerates standing for 20-30 sec increments before requiring seated rest  Ambulation/Gait             General Gait Details: not attempted   Chief Strategy Officer  Modified Rankin (Stroke Patients Only)       Balance Overall balance assessment: Needs assistance Sitting-balance support: Feet supported;Bilateral upper extremity supported Sitting balance-Leahy Scale: Fair Sitting balance - Comments: seated EOB   Standing balance support: Bilateral upper extremity supported;During functional activity Standing balance-Leahy Scale: Poor Standing balance comment: reliant on  RW/therapist assist       Cognition Arousal/Alertness: Awake/alert Behavior During Therapy: WFL for tasks assessed/performed Overall Cognitive Status: Within Functional Limits for tasks assessed       Exercises      General Comments        Pertinent Vitals/Pain Pain Assessment: No/denies pain    Home Living                      Prior Function            PT Goals (current goals can now be found in the care plan section) Acute Rehab PT Goals Patient Stated Goal: home PT Goal Formulation: With patient/family Time For Goal Achievement: 01/15/20 Potential to Achieve Goals: Good Progress towards PT goals: Progressing toward goals    Frequency    Min 3X/week      PT Plan Current plan remains appropriate    Co-evaluation              AM-PAC PT "6 Clicks" Mobility   Outcome Measure  Help needed turning from your back to your side while in a flat bed without using bedrails?: A Lot Help needed moving from lying on your back to sitting on the side of a flat bed without using bedrails?: A Lot Help needed moving to and from a bed to a chair (including a wheelchair)?: Total Help needed standing up from a chair using your arms (e.g., wheelchair or bedside chair)?: A Lot Help needed to walk in hospital room?: Total Help needed climbing 3-5 steps with a railing? : Total 6 Click Score: 9    End of Session Equipment Utilized During Treatment: Gait belt;Oxygen Activity Tolerance: Patient limited by fatigue Patient left: in bed;with call bell/phone within reach;with nursing/sitter in room;with family/visitor present Nurse Communication: Mobility status;Other (comment) (NT in room changing linen/cleaning pt up and placed bedpan at EOS per pt request) PT Visit Diagnosis: Muscle weakness (generalized) (M62.81);Unsteadiness on feet (R26.81);Other abnormalities of gait and mobility (R26.89);Difficulty in walking, not elsewhere classified (R26.2)     Time:  3009-2330 PT Time Calculation (min) (ACUTE ONLY): 23 min  Charges:  $Therapeutic Activity: 8-22 mins                      Tori Tasmia Blumer PT, DPT 01/02/20, 10:48 AM [phone]

## 2020-01-02 NOTE — Progress Notes (Signed)
Patient Demographics:    [name], is a 85 y.o. female, DOB - 13-Apr-1935, GEX:[id number]  Admit date - 12/26/2019   Admitting Physician Tysha Grismore Denton Brick, MD  Outpatient Primary MD for the patient is [name], MD  LOS - 7   Chief Complaint  Patient presents with  . Abdominal Pain        Subjective:    [name] today has no fevers, ,  No chest pain,   -Oral intake is fair, no emesis -Fatigue and generalized weakness persist -Daughter at bedside,  Patient had couple bowel movements -Hypoxia resolved at rest -We will check oxygen saturation post ambulation prior to discharge to see if  patient needs O2 at discharge  Assessment  & Plan :    Principal Problem:   Pancreatitis, acute Active Problems:   New onset a-fib ([place])   Diabetes mellitus type 2 with complications ([place])   Chronic diastolic CHF (congestive heart failure) (HCC)/EF > 60 %   CKD (chronic kidney disease), stage IIIb  Brief Summary:- 85 y.o. female with past medical history relevant for HTN, DM, CKD 3B,dCHF ,PAFib, chronic LBBand chronic lower extremity edema admitted on 12/26/2019 with nausea vomiting the setting of acute pancreatitis similar to her presentation in March 2021 -Patient is weak and unsteady physical therapist recommends SNF rehab  A/p 1)Acute Pancreatitis---    Pepcid, as needed antiemetics and opiates -CT abdomen pelvis consistent with acute pancreatitis -Recent lipid profile without significant triglyceridemia -Please note the patient was admitted for same back in March 2021 -Patient is status post prior cholecystectomy -Review of her medications indicated torsemide, gabapentin and simvastatin could have triggered her pancreatitis  -Lipase  1400 >> 120 >>23 -Stopped IV fluids due to volume overload -Diet advanced to solids  2)PAFib--- patient with paroxysmal atrial fibrillation,CHADSVASc of 8. She  is currently on oral diltiazem CD 120 mg for rate control. -continue Eliquis 2.5 mg twice daily for stroke prophylaxis  -Appears back in sinus rhythm -Echo from 07/08/2019 with EF of 60 to 65% -Nuclear stress test from 07/18/2019 without reversible ischemia  3) chronic diastolic congestive heart failure-right-sided heart failure symptoms noted---   -Last known EF 60 to 65% -Chronic lower extremity edema,  -Developed volume overload in the setting of IV hydration for acute pancreatitis -Respiratory status and hypoxia improved with IV Lasix and diuresis -Hypoxia resolved at rest -We will check oxygen saturation post ambulation prior to discharge to see if  patient needs O2 at discharge -Stop IV Lasix okay to start oral Lasix in the a.m.  4)CKD stage - IIIb--- renal function appears to be stable at this time -- renally adjust medications, avoid nephrotoxic agents / dehydration / hypotension --creatinine is now up to 1.91>>> 1.62 -May have to tolerate some worsening renal function with diuretics  5)Diabetes--- PTA patient was chronically on high-dose Levemir.  -Given persistent hypoglycemia will change IV fluids to normal saline from D5, and continue Levemir insulin further  to 60 units ---Use Novolog/Humalog Sliding scale insulin with Accu-Cheks/Fingersticks as ordered    6)HTN--- BP is not at goal, increase hydralazine to 100mg  TID,  continue Cardizem CD 120 mg,, increased isosorbide to 60 mg daily  --iv Labetalol as needed  7)Abnormal imaging findings. Imaging findings suggest thyroid nodule  and possible ---outpatient follow-up with PCP for further investigation/work-up strongly advised -Outpatient biopsy scheduled by Loni Beckwith patient's endocrinologist  8)Morbid obesity/possible OSA--BMI 38, diet and lifestyle modification discussed  -Given documented history of hypercapnia patient needs outpatient sleep study  9) chronic anemia---  suspect some degree of anemia  of CKD,  ---hemoglobin currently stable around 9  10) acute hypoxic respiratory failure--- secondary to volume overload status in the setting of IV hydration while n.p.o. for Appetite -Hypoxia resolved at rest -We will check oxygen saturation post ambulation prior to discharge to see if  patient needs O2 at discharge  11) generalized weakness and deconditioning----physical therapy evaluation in a.m. after improvement in hypoxia post diuresis --Patient is weak and unsteady physical therapist recommends SNF rehab  12)Acute urinary retention requiring in and out catheterization----Flomax as ordered --Patient voiding okay at this time  DVT prophylaxis:Eliquis Code Status:Full code  Disposition/Need for in-Hospital Stay- patient unable to be discharged at this time due to --- --Patient is weak and unsteady physical therapist recommends SNF rehab --May need oxygen upon discharge  Status is: Inpatient  Dispo: The patient is from: Home  Anticipated d/c is to: SNF  Anticipated d/c date is: 2 days Barriers: ---Patient is weak and unsteady physical therapist recommends SNF rehab --May need oxygen upon discharge  Family Communication:    (patient is alert, awake and coherent)  Discussed with daughter at bedside  Consults  :  na  DVT Prophylaxis  : Eliquis  Lab Results  Component Value Date   PLT 247 01/01/2020    Inpatient Medications  Scheduled Meds: . apixaban  2.5 mg Oral BID  . diltiazem  120 mg Oral Daily  . [START ON 01/03/2020] furosemide  20 mg Oral Daily  . gabapentin  300 mg Oral BID  . hydrALAZINE  100 mg Oral TID  . insulin aspart  0-5 Units Subcutaneous QHS  . insulin aspart  0-9 Units Subcutaneous TID WC  . insulin detemir  60 Units Subcutaneous QHS  . isosorbide mononitrate  60 mg Oral Daily  . labetalol  10 mg Intravenous Once  . polyethylene glycol  17 g Oral Daily  . senna-docusate  2 tablet Oral BID  . sodium  chloride flush  3 mL Intravenous Q12H  . tamsulosin  0.4 mg Oral QPC supper   Continuous Infusions: . sodium chloride 250 mL (01/02/20 1822)  . famotidine (PEPCID) IV 20 mg (01/02/20 1825)   PRN Meds:.sodium chloride, acetaminophen, albuterol, HYDROmorphone (DILAUDID) injection, labetalol, ondansetron **OR** ondansetron (ZOFRAN) IV, oxyCODONE, sodium chloride flush, traZODone   Anti-infectives (From admission, onward)   None       Objective:   Vitals:   01/01/20 2036 01/02/20 0500 01/02/20 1254 01/02/20 1300  BP: (!) 194/66 (!) 180/60 (!) 189/68   Pulse: 80 80 81   Resp:   16   Temp: 98.5 F (36.9 C) 98.9 F (37.2 C) 98.6 F (37 C)   TempSrc: Oral  Oral   SpO2: 100% 100% 97% 96%  Weight:      Height:        Wt Readings from Last 3 Encounters:  01/01/20 111.7 kg  10/09/19 101.6 kg  08/04/19 100.5 kg    Intake/Output Summary (Last 24 hours) at 01/02/2020 1923 Last data filed at 01/02/2020 1826 Gross per 24 hour  Intake 3 ml  Output 3400 ml  Net -3397 ml   Physical Exam Gen:- Awake Alert, in no acute distress HEENT:- [place].AT, No sclera icterus Neck-Supple Neck,No JVD,.  Lungs-improved air movement no wheezing  CV- S1, S2 normal, regular  Abd-  +ve B.Sounds, Abd Soft,  resolved periumbilical and epigastric area tenderness  Extremity/Skin:- 1+ve  Edema, pedal pulses present  Psych-affect is appropriate, oriented x3 Neuro-Generalized weakness without new focal deficits , no tremors   Data Review:   Micro Results Recent Results (from the past 240 hour(s))  SARS Coronavirus 2 by RT PCR (hospital order, performed in [facility] hospital lab) Nasopharyngeal Nasopharyngeal Swab     Status: None   Collection Time: 12/26/19 11:36 AM   Specimen: Nasopharyngeal Swab  Result Value Ref Range Status   SARS Coronavirus 2 NEGATIVE NEGATIVE Final    Comment: (NOTE) SARS-CoV-2 target nucleic acids are NOT DETECTED.  The SARS-CoV-2 RNA is generally detectable in upper and  lower respiratory specimens during the acute phase of infection. The lowest concentration of SARS-CoV-2 viral copies this assay can detect is 250 copies / mL. A negative result does not preclude SARS-CoV-2 infection and should not be used as the sole basis for treatment or other patient management decisions.  A negative result may occur with improper specimen collection / handling, submission of specimen other than nasopharyngeal swab, presence of viral mutation(s) within the areas targeted by this assay, and inadequate number of viral copies (<250 copies / mL). A negative result must be combined with clinical observations, patient history, and epidemiological information.  Fact Sheet for Patients:   StrictlyIdeas.no  Fact Sheet for Healthcare Providers: BankingDealers.co.za  This test is not yet approved or  cleared by the [place] FDA and has been authorized for detection and/or diagnosis of SARS-CoV-2 by FDA under an Emergency Use Authorization (EUA).  This EUA will remain in effect (meaning this test can be used) for the duration of the COVID-19 declaration under Section 564(b)(1) of the Act, 21 U.S.C. section 360bbb-3(b)(1), unless the authorization is terminated or revoked sooner.  Performed at [facility], [address], [place], [place] [address]     Radiology Reports CT Abdomen Pelvis W Contrast  Result Date: 12/26/2019 CLINICAL DATA:  PT brought in by [facility] from home c/o epigastric nagging pain with nausea that started yesterday evening. PT states she had some vomiting last night and no diarrhea. EXAM: CT ABDOMEN AND PELVIS WITH CONTRAST TECHNIQUE: Multidetector CT imaging of the abdomen and pelvis was performed using the standard protocol following bolus administration of intravenous contrast. CONTRAST:  55mL OMNIPAQUE IOHEXOL 300 MG/ML  SOLN COMPARISON:  07/23/2019 FINDINGS: Lower chest: No acute abnormality.  Hepatobiliary: No focal liver abnormality is seen. Status post cholecystectomy. No biliary dilatation. Pancreas: There is peripancreatic inflammation reflected by stranding and hazy increased attenuation in the peripancreatic fat that extends along the second and third portion of the duodenum and gastrohepatic ligament as well as the root of the small bowel mesentery. Mild inflammation is noted along the right anterior pararenal fascia. Pancreas shows homogeneous enhancement. There is irregular duct dilation, maximum 6 mm. No pancreatic mass. No collection to suggest a pseudocyst or abscess. Spleen: Normal in size without focal abnormality. Adrenals/Urinary Tract: No adrenal masses. Bilateral renal cortical thinning. Small low-density renal lesions are noted, largest anterior upper pole of the left kidney, 11 mm, consistent with cysts, and without change from the prior CT. Small nonobstructing stone in the upper pole the left kidney. Bilateral vascular calcifications. No hydronephrosis. Normal ureters. Normal bladder. Stomach/Bowel: Normal stomach. Small bowel and colon are normal in caliber. No wall thickening. No inflammation. Normal appendix visualized. Vascular/Lymphatic: Patent portal vein,  splenic vein and superior mesenteric vein. Aortic and branch vessel atherosclerosis. No aneurysm. There are subcentimeter peripancreatic and gastrohepatic ligament lymph nodes. Reproductive: Normal uterus. Bilateral tubal ligation clips, stable. No adnexal masses. Other: No ascites. There is increased attenuation in the subcutaneous fat of the lower abdomen similar to the prior CT. Small fat containing umbilical hernia. Musculoskeletal: No fracture or acute finding. No osteoblastic or osteolytic lesions. Previous right proximal femur ORIF with a compression screw. Chronic appearing avascular necrosis along the superior right femoral head. IMPRESSION: 1. Acute uncomplicated pancreatitis. No evidence of pancreatic necrosis or  of an abscess or pseudocyst. No venous thrombosis. 2. No other acute abnormality within the abdomen or pelvis. 3. Aortic atherosclerosis. Electronically Signed   By: Lajean Manes M.D.   On: 12/26/2019 13:15   DG Chest Port 1 View  Result Date: 01/01/2020 CLINICAL DATA:  Short of breath, diabetes, negative COVID-19 EXAM: PORTABLE CHEST 1 VIEW COMPARISON:  May 13 21 FINDINGS: Single frontal view of the chest demonstrates persistent enlargement of the cardiac silhouette. Mild diffuse interstitial prominence without focal consolidation, effusion, or pneumothorax. Subsegmental atelectasis right upper lobe. No acute bony abnormalities. IMPRESSION: 1. Stable enlarged cardiac silhouette and interstitial prominence consistent with interstitial edema. Electronically Signed   By: Randa Ngo M.D.   On: 01/01/2020 16:11   DG Chest Port 1 View  Result Date: 12/29/2019 CLINICAL DATA:  Acute onset shortness of breath. Known pancreatitis. EXAM: PORTABLE CHEST 1 VIEW COMPARISON:  One-view chest x-ray 12/26/2019 FINDINGS: Heart is enlarged. Mild interstitial pattern has increased slightly. Fluid is noted in the right minor fissure. No significant airspace consolidation is present. IMPRESSION: Cardiomegaly with slight increase in interstitial pattern, likely reflecting mild edema. No focal airspace disease or consolidation. Electronically Signed   By: San Morelle M.D.   On: 12/29/2019 04:53   DG Chest Port 1 View  Result Date: 12/26/2019 CLINICAL DATA:  Epigastric abdominal pain and vomiting. EXAM: PORTABLE CHEST 1 VIEW COMPARISON:  07/31/2019 FINDINGS: The heart is enlarged but stable. Stable tortuosity of the thoracic aorta. Stable eventration of the left hemidiaphragm with some overlying vascular crowding and atelectasis. No infiltrates or effusions. IMPRESSION: 1. Stable cardiac enlargement. 2. No acute pulmonary findings. Electronically Signed   By: Marijo Sanes M.D.   On: 12/26/2019 09:45      CBC Recent Labs  Lab 12/28/19 0621 12/29/19 0556 12/30/19 0709 12/31/19 0718 01/01/20 0530  WBC 16.4* 15.8* 13.0* 11.5* 10.2  HGB 9.7* 9.6* 9.5* 8.9* 9.0*  HCT 32.0* 31.9* 30.8* 29.4* 29.6*  PLT 207 222 231 229 247  MCV 92.5 93.3 92.2 92.2 91.4  MCH 28.0 28.1 28.4 27.9 27.8  MCHC 30.3 30.1 30.8 30.3 30.4  RDW 17.0* 17.2* 16.9* 16.5* 16.4*    Chemistries  Recent Labs  Lab 12/27/19 0425 12/27/19 0425 12/28/19 0621 12/29/19 0556 12/30/19 0709 12/31/19 0718 01/01/20 0530  NA 139   < > 141 141 140 137 137  K 4.4   < > 4.6 4.2 4.0 4.0 4.1  CL 107   < > 110 111 110 106 106  CO2 23   < > 23 20* 23 24 24   GLUCOSE 428*   < > 241* 178* 82 184* 209*  BUN 21   < > 31* 39* 38* 42* 36*  CREATININE 1.45*   < > 1.74* 1.82* 1.63* 1.91* 1.62*  CALCIUM 7.9*   < > 8.1* 8.1* 8.2* 7.9* 7.9*  AST 12*  --   --   --   --   --   --  ALT 12  --   --   --   --   --   --   ALKPHOS 51  --   --   --   --   --   --   BILITOT 0.5  --   --   --   --   --   --    < > = values in this interval not displayed.   ------------------------------------------------------------------------------------------------------------------ No results for input(s): CHOL, HDL, LDLCALC, TRIG, CHOLHDL, LDLDIRECT in the last 72 hours.  Lab Results  Component Value Date   HGBA1C 8.0 (H) 07/23/2019   ------------------------------------------------------------------------------------------------------------------ No results for input(s): TSH, T4TOTAL, T3FREE, THYROIDAB in the last 72 hours.  Invalid input(s): FREET3 ------------------------------------------------------------------------------------------------------------------ No results for input(s): VITAMINB12, FOLATE, FERRITIN, TIBC, IRON, RETICCTPCT in the last 72 hours.  Coagulation profile No results for input(s): INR, PROTIME in the last 168 hours.  No results for input(s): DDIMER in the last 72 hours.  Cardiac Enzymes No results for input(s): CKMB,  TROPONINI, MYOGLOBIN in the last 168 hours.  Invalid input(s): CK ------------------------------------------------------------------------------------------------------------------    Component Value Date/Time   BNP 116.0 (H) 12/26/2019 0370   Roxan Hockey M.D on 01/02/2020 at 7:23 PM  Go to [email or web address] - for contact info  [facility] - Office  [phone]

## 2020-01-03 ENCOUNTER — Telehealth: Payer: Medicare HMO | Admitting: Cardiovascular Disease

## 2020-01-03 DIAGNOSIS — N1832 Chronic kidney disease, stage 3b: Secondary | ICD-10-CM

## 2020-01-03 DIAGNOSIS — M25562 Pain in left knee: Secondary | ICD-10-CM | POA: Diagnosis not present

## 2020-01-03 DIAGNOSIS — E119 Type 2 diabetes mellitus without complications: Secondary | ICD-10-CM | POA: Diagnosis not present

## 2020-01-03 DIAGNOSIS — I10 Essential (primary) hypertension: Secondary | ICD-10-CM

## 2020-01-03 DIAGNOSIS — K219 Gastro-esophageal reflux disease without esophagitis: Secondary | ICD-10-CM

## 2020-01-03 DIAGNOSIS — E1140 Type 2 diabetes mellitus with diabetic neuropathy, unspecified: Secondary | ICD-10-CM | POA: Diagnosis not present

## 2020-01-03 DIAGNOSIS — K859 Acute pancreatitis without necrosis or infection, unspecified: Secondary | ICD-10-CM | POA: Diagnosis not present

## 2020-01-03 DIAGNOSIS — M6281 Muscle weakness (generalized): Secondary | ICD-10-CM | POA: Diagnosis not present

## 2020-01-03 DIAGNOSIS — I4891 Unspecified atrial fibrillation: Secondary | ICD-10-CM | POA: Diagnosis not present

## 2020-01-03 DIAGNOSIS — Z7401 Bed confinement status: Secondary | ICD-10-CM | POA: Diagnosis not present

## 2020-01-03 DIAGNOSIS — R7309 Other abnormal glucose: Secondary | ICD-10-CM | POA: Diagnosis not present

## 2020-01-03 DIAGNOSIS — I5030 Unspecified diastolic (congestive) heart failure: Secondary | ICD-10-CM | POA: Diagnosis not present

## 2020-01-03 DIAGNOSIS — R338 Other retention of urine: Secondary | ICD-10-CM

## 2020-01-03 DIAGNOSIS — I482 Chronic atrial fibrillation, unspecified: Secondary | ICD-10-CM | POA: Diagnosis not present

## 2020-01-03 DIAGNOSIS — I5032 Chronic diastolic (congestive) heart failure: Secondary | ICD-10-CM | POA: Diagnosis not present

## 2020-01-03 DIAGNOSIS — J9601 Acute respiratory failure with hypoxia: Secondary | ICD-10-CM | POA: Diagnosis not present

## 2020-01-03 DIAGNOSIS — I4819 Other persistent atrial fibrillation: Secondary | ICD-10-CM | POA: Diagnosis not present

## 2020-01-03 DIAGNOSIS — G8929 Other chronic pain: Secondary | ICD-10-CM | POA: Diagnosis not present

## 2020-01-03 DIAGNOSIS — M25561 Pain in right knee: Secondary | ICD-10-CM | POA: Diagnosis not present

## 2020-01-03 DIAGNOSIS — I959 Hypotension, unspecified: Secondary | ICD-10-CM | POA: Diagnosis not present

## 2020-01-03 DIAGNOSIS — D631 Anemia in chronic kidney disease: Secondary | ICD-10-CM | POA: Diagnosis not present

## 2020-01-03 DIAGNOSIS — J810 Acute pulmonary edema: Secondary | ICD-10-CM | POA: Diagnosis not present

## 2020-01-03 DIAGNOSIS — E1122 Type 2 diabetes mellitus with diabetic chronic kidney disease: Secondary | ICD-10-CM | POA: Diagnosis not present

## 2020-01-03 DIAGNOSIS — I480 Paroxysmal atrial fibrillation: Secondary | ICD-10-CM | POA: Diagnosis not present

## 2020-01-03 DIAGNOSIS — R339 Retention of urine, unspecified: Secondary | ICD-10-CM | POA: Diagnosis not present

## 2020-01-03 DIAGNOSIS — E118 Type 2 diabetes mellitus with unspecified complications: Secondary | ICD-10-CM | POA: Diagnosis not present

## 2020-01-03 DIAGNOSIS — N1831 Chronic kidney disease, stage 3a: Secondary | ICD-10-CM | POA: Diagnosis not present

## 2020-01-03 DIAGNOSIS — E785 Hyperlipidemia, unspecified: Secondary | ICD-10-CM | POA: Diagnosis not present

## 2020-01-03 DIAGNOSIS — J811 Chronic pulmonary edema: Secondary | ICD-10-CM | POA: Diagnosis not present

## 2020-01-03 DIAGNOSIS — R29898 Other symptoms and signs involving the musculoskeletal system: Secondary | ICD-10-CM | POA: Diagnosis not present

## 2020-01-03 DIAGNOSIS — I2510 Atherosclerotic heart disease of native coronary artery without angina pectoris: Secondary | ICD-10-CM | POA: Diagnosis not present

## 2020-01-03 DIAGNOSIS — G629 Polyneuropathy, unspecified: Secondary | ICD-10-CM | POA: Diagnosis not present

## 2020-01-03 DIAGNOSIS — Z7189 Other specified counseling: Secondary | ICD-10-CM | POA: Diagnosis not present

## 2020-01-03 DIAGNOSIS — I447 Left bundle-branch block, unspecified: Secondary | ICD-10-CM | POA: Diagnosis not present

## 2020-01-03 DIAGNOSIS — Z79899 Other long term (current) drug therapy: Secondary | ICD-10-CM | POA: Diagnosis not present

## 2020-01-03 LAB — CBC
HCT: 27.6 % — ABNORMAL LOW (ref 36.0–46.0)
Hemoglobin: 8.6 g/dL — ABNORMAL LOW (ref 12.0–15.0)
MCH: 28.1 pg (ref 26.0–34.0)
MCHC: 31.2 g/dL (ref 30.0–36.0)
MCV: 90.2 fL (ref 80.0–100.0)
Platelets: 311 10*3/uL (ref 150–400)
RBC: 3.06 MIL/uL — ABNORMAL LOW (ref 3.87–5.11)
RDW: 16.1 % — ABNORMAL HIGH (ref 11.5–15.5)
WBC: 8.5 10*3/uL (ref 4.0–10.5)
nRBC: 0 % (ref 0.0–0.2)

## 2020-01-03 LAB — BASIC METABOLIC PANEL
Anion gap: 6 (ref 5–15)
BUN: 23 mg/dL (ref 8–23)
CO2: 26 mmol/L (ref 22–32)
Calcium: 8.2 mg/dL — ABNORMAL LOW (ref 8.9–10.3)
Chloride: 107 mmol/L (ref 98–111)
Creatinine, Ser: 1.3 mg/dL — ABNORMAL HIGH (ref 0.44–1.00)
GFR calc Af Amer: 44 mL/min — ABNORMAL LOW (ref >60)
GFR calc non Af Amer: 38 mL/min — ABNORMAL LOW (ref >60)
Glucose, Bld: 180 mg/dL — ABNORMAL HIGH (ref 70–99)
Potassium: 3.9 mmol/L (ref 3.5–5.1)
Sodium: 139 mmol/L (ref 135–145)

## 2020-01-03 LAB — GLUCOSE, CAPILLARY
Glucose-Capillary: 173 mg/dL — ABNORMAL HIGH (ref 70–99)
Glucose-Capillary: 180 mg/dL — ABNORMAL HIGH (ref 70–99)
Glucose-Capillary: 198 mg/dL — ABNORMAL HIGH (ref 70–99)
Glucose-Capillary: 230 mg/dL — ABNORMAL HIGH (ref 70–99)

## 2020-01-03 LAB — SARS CORONAVIRUS 2 BY RT PCR (HOSPITAL ORDER, PERFORMED IN ~~LOC~~ HOSPITAL LAB): SARS Coronavirus 2: NEGATIVE

## 2020-01-03 MED ORDER — HYDROCORTISONE (PERIANAL) 2.5 % EX CREA: 1.0000 "application " | TOPICAL_CREAM | Freq: Two times a day (BID) | CUTANEOUS | Status: DC | PRN

## 2020-01-03 MED ORDER — TAMSULOSIN HCL 0.4 MG PO CAPS: 0.4000 mg | ORAL_CAPSULE | Freq: Every day | ORAL | Status: DC

## 2020-01-03 MED ORDER — ISOSORBIDE MONONITRATE ER 60 MG PO TB24: 30.0000 mg | ORAL_TABLET | Freq: Every day | ORAL | Status: DC

## 2020-01-03 MED ORDER — FAMOTIDINE 20 MG PO TABS: 20.0000 mg | ORAL_TABLET | Freq: Every day | ORAL | Status: DC

## 2020-01-03 MED ORDER — FAMOTIDINE 20 MG PO TABS
20.0000 mg | ORAL_TABLET | Freq: Every day | ORAL | Status: DC
  Administered 2020-01-03: 20 mg via ORAL
  Filled 2020-01-03: qty 1

## 2020-01-03 NOTE — Care Management Important Message (Signed)
Important Message  Patient Details  Name: [name] MRN: [id number] Date of Birth: 03/11/1935   Medicare Important Message Given:  Yes     Tommy Medal 01/03/2020, 4:12 PM

## 2020-01-03 NOTE — Progress Notes (Signed)
Report called to Clydie Braun, nurse at [facility] and all questions answered. IV removed, 2x2 gauze and paper tape applied to site, patient tolerated well. Patient awaiting transport to [facility] via [facility]. Patient's daughter, Johny Shears, called and message left notify of patient being discharged to [facility].

## 2020-01-03 NOTE — TOC Transition Note (Signed)
Transition of Care [facility]) - CM/SW Discharge Note   Patient Details  Name: [name] MRN: [id number] Date of Birth: [birth year]  [facility]) CM/SW Contact:  Shade Flood, LCSW Phone Number: 01/03/2020, 1:58 PM   Clinical Narrative:     Pt stable for dc today and [facility] has insurance auth. Pt can transfer once repeat covid test results return negative. Pt going to room B20 and RN can call report to [phone]. DC clinical will be sent electronically.   EMS form printed to the floor.   Spoke with pt and her daughter, [name], to update. They remain in agreement with the plan.   There are no other TOC needs for dc.  Final next level of care: Skilled Nursing Facility Barriers to Discharge: Barriers Resolved   Patient Goals and CMS Choice Patient states their goals for this hospitalization and ongoing recovery are:: short term rehab prior to return home CMS Medicare.gov Compare Post Acute Care list provided to:: Patient Choice offered to / list presented to : Patient  Discharge Placement   Existing PASRR number confirmed : 01/01/20          Patient chooses bed at: Other - please specify in the comment section below: ([facility]) Patient to be transferred to facility by: EMS Name of family member notified: [name] (daughter) Patient and family notified of of transfer: 01/03/20  Discharge Plan and Services In-house Referral: Clinical Social Work   Post Acute Care Choice: [place]                               Social Determinants of Health (SDOH) Interventions     Readmission Risk Interventions Readmission Risk Prevention Plan 07/27/2019  Transportation Screening Complete  Medication Review (RN CM) Complete  Some recent data might be hidden

## 2020-01-03 NOTE — Discharge Summary (Signed)
Physician Discharge Summary  [name] RKY:[id number] DOB: October 30, 1934 DOA: 12/26/2019  PCP: [name], MD  Admit date: 12/26/2019 Discharge date: 01/03/2020  Time spent: 35 minutes  Recommendations for Outpatient Follow-up:  1. Repeat basic metabolic panel to follow electrolytes renal function 2. Repeat CBC to follow hemoglobin trend 3. Reassess blood pressure and further adjust antihypertensive regimen.   Discharge Diagnoses:  Principal Problem:   Pancreatitis, acute Active Problems:   CKD (chronic kidney disease), stage IIIb   Diabetes mellitus type 2 with complications (HCC)   Atrial fibrillation, chronic (HCC)   Chronic diastolic CHF (congestive heart failure) (HCC)/EF > 60 %   Acute urinary retention   Discharge Condition: Stable and improved.  Discharged to skilled nursing facility for further care and rehabilitation.  Outpatient follow-up with PCP and cardiology as previously instructed.  CODE STATUS: Full code.  Diet recommendation: Modified carbohydrate diet, low calorie and low sodium diet.  Filed Weights   12/31/19 0603 01/01/20 0500 01/03/20 0600  Weight: 112 kg 111.7 kg 108.6 kg    History of present illness:  As per H&P written by Dr. Denton Brick on 12/26/2019 [name]  is a 85 y.o. female with past medical history relevant for HTN, DM, CKD 3B,dCHF ,PAFib, chronic LBBand chronic lower extremity edema who presents to the ED with epigastric pain associated with nausea and vomiting -Emesis is without blood or bile -Patient denies diarrhea, no fevers no chills Denies dyspnea,-She does have chronic lower extremity edema  -In ED patient found to have findings of acute pancreatitis  -CT abdomen and pelvis consistent with acute pancreatitis,lipase is found to be 1,444, LFTs are not elevated -WBC is 13.2 with hemoglobin of 12.4 -Creatinine is 1.31 which is actually better than recent baseline,  -UA appears colonized, with glucosuria and proteinuria -Chest  x-ray without acute findings -No significant elevation of troponin, BNP 116  Hospital Course:  1-acute pancreatitis -No necrosis or superimposed infection. -After receiving supportive care, fluid resuscitation and bowel rest acute flare has subsided. -Patient tolerating diet, lipase level 23 at discharge and no having any nausea or vomiting. -Heart healthy diet has been recommended. -Patient advised to maintain adequate hydration. -Continue Tylenol as needed for pain.  2-chronic paroxysmal atrial fibrillation -CHADSVASC score 6 -Continue Cardizem for rate management -Continue the use of Eliquis -Rate control and sinus at time of discharge.  3-gastroesophageal reflux disease -Continue the use of Pepcid  4-uncontrolled hypertension -Low sodium diet has been recommended -Continue the use of adjusted dose of Imdur, torsemide, Cardizem and hydralazine -Reassess blood pressure and further adjust antihypertensive regimen as needed.  5-type 2 diabetes with chronic kidney disease -Continue the use of sliding scale insulin and Levemir -Modified carbohydrate diet has been recommended. -Patient advised to follow adequate hydration.  6-chronic kidney disease a stage IIIb -Renal function is stable and with creatinine level at baseline at time of discharge -Repeat basic metabolic panel follow-up visit to reassess stability.  7-acute hypoxemic respiratory failure -Appears to be in the setting of fluid overload after fluid resuscitation given for acute pancreatitis process. -After providing gentle diuresis and metabolizing her volume patient no longer requiring oxygen supplementation.  8-chronic diastolic heart failure -Last ejection fraction 60 to 65% -Patient with chronic lower extremity swelling intermittently. -Continue blood pressure management, daily weights, low-sodium diet and current use of torsemide. -Outpatient follow-up with cardiology service as previously  scheduled.  9-morbid obesity -Body mass index is 38.64 kg/m. -Low calorie diet, portion control and lifestyle changes have been discussed with  patient. -Outpatient sleep study recommended with concerns for non-diagnosed obstructive sleep apnea.  10-anemia of chronic kidney disease -Stable and without signs of overt bleeding -Continue to follow hemoglobin trend.  11-acute urinary retention -Patient required in and out catheterization but at discharge no need for Foley catheter and voiding spontaneously. -Will continue using Flomax.  Procedures:  See below for x-ray reports.  Consultations:  None  Discharge Exam: Vitals:   01/03/20 0429 01/03/20 0912  BP: (!) 164/65   Pulse: 86   Resp: 18   Temp: 98.7 F (37.1 C)   SpO2: 95% 96%    General: Afebrile, no nausea, no chest pain, no vomiting, tolerating diet without problem and good oxygen saturation on room air.  Weak and deconditioned, but is stable to be discharged to skilled nursing facility for further care and rehabilitation Cardiovascular: Rate controlled, no rubs, no gallops, no JVD on exam. Respiratory: Good air movement bilaterally, no wheezing, no using accessory muscles.  Good oxygen saturation on room air. Abdomen: Soft, nontender, nondistended, positive bowel sounds Extremities: Trace edema bilaterally, no cyanosis or clubbing.  Discharge Instructions   Discharge Instructions    (HEART FAILURE PATIENTS) Call MD:  Anytime you have any of the following symptoms: 1) 3 pound weight gain in 24 hours or 5 pounds in 1 week 2) shortness of breath, with or without a dry hacking cough 3) swelling in the hands, feet or stomach 4) if you have to sleep on extra pillows at night in order to breathe.   Complete by: As directed    Diet - low sodium heart healthy   Complete by: As directed    Discharge instructions   Complete by: As directed    Take medications as prescribed Follow low-sodium (less than 2.5 g daily) and low  calorie diet. Continue to monitor CBGs, take insulin therapy as prescribed and follow modified carbohydrate diet. Maintain adequate hydration Follow-up with PCP in 10 days after discharge from the skilled nursing facility Physical therapy/rehabilitation and conditioning as per the skilled nursing facility protocol. Follow daily weights.   Increase activity slowly   Complete by: As directed      Allergies as of 01/03/2020   No Known Allergies     Medication List    TAKE these medications   acetaminophen 325 MG tablet Commonly known as: TYLENOL Take 2 tablets (650 mg total) by mouth every 6 (six) hours as needed for mild pain, fever or headache (or Fever >/= 101).   apixaban 2.5 MG Tabs tablet Commonly known as: ELIQUIS Take 1 tablet (2.5 mg total) by mouth 2 (two) times daily.   diltiazem 120 MG 24 hr capsule Commonly known as: CARDIZEM CD Take 1 capsule (120 mg total) by mouth daily.   famotidine 20 MG tablet Commonly known as: PEPCID Take 1 tablet (20 mg total) by mouth daily. Start taking on: January 04, 2020   gabapentin 300 MG capsule Commonly known as: NEURONTIN Take 300 mg by mouth 2 (two) times daily.   hydrALAZINE 50 MG tablet Commonly known as: APRESOLINE Take 1.5 tablets (75 mg total) by mouth 2 (two) times daily.   hydrocortisone 2.5 % rectal cream Commonly known as: ANUSOL-HC Place 1 application rectally 2 (two) times daily as needed for hemorrhoids or anal itching. What changed:   when to take this  reasons to take this   insulin aspart 100 UNIT/ML FlexPen Commonly known as: NOVOLOG Inject 0-10 Units into the skin 3 (three) times daily with meals. insulin aspart (  novoLOG) injection 0-10 Units 0-10 Units Subcutaneous, 3 times daily with meals CBG < 70: Implement Hypoglycemia Standing Orders and refer to Hypoglycemia Standing Orders sidebar report  CBG 70 - 120: 0 unit CBG 121 - 150: 0 unit  CBG 151 - 200: 1 unit CBG 201 - 250: 2 units CBG 251 - 300: 4  units CBG 301 - 350: 6 units  CBG 351 - 400: 8 units  CBG > 400: 10 units What changed: Another medication with the same name was removed. Continue taking this medication, and follow the directions you see here.   insulin detemir 100 UNIT/ML injection Commonly known as: LEVEMIR Inject 0.7 mLs (70 Units total) into the skin at bedtime. What changed: how much to take   isosorbide mononitrate 60 MG 24 hr tablet Commonly known as: IMDUR Take 0.5 tablets (30 mg total) by mouth daily. What changed: medication strength   simvastatin 20 MG tablet Commonly known as: ZOCOR Take 20 mg by mouth daily.   tamsulosin 0.4 MG Caps capsule Commonly known as: FLOMAX Take 1 capsule (0.4 mg total) by mouth daily after supper.   torsemide 20 MG tablet Commonly known as: DEMADEX Take 1 tablet (20 mg total) by mouth 2 (two) times daily.      No Known Allergies  Contact information for follow-up providers    [name], MD. Schedule an appointment as soon as possible for a visit in 10 day(s).   Specialty: Internal Medicine Why: After discharge from the skilled nursing facility. Contact information: [place] [address] [phone]        Herminio Commons, MD .   Specialty: Cardiology Contact information: [place] [address] [phone]            Contact information for after-discharge care    [facility] .   Service: Skilled Nursing Contact information: [address] [place] [phone]                  The results of significant diagnostics from this hospitalization (including imaging, microbiology, ancillary and laboratory) are listed below for reference.    Significant Diagnostic Studies: CT Abdomen Pelvis W Contrast  Result Date: 12/26/2019 CLINICAL DATA:  PT brought in by [facility] from home c/o epigastric nagging pain with nausea that started  yesterday evening. PT states she had some vomiting last night and no diarrhea. EXAM: CT ABDOMEN AND PELVIS WITH CONTRAST TECHNIQUE: Multidetector CT imaging of the abdomen and pelvis was performed using the standard protocol following bolus administration of intravenous contrast. CONTRAST:  19mL OMNIPAQUE IOHEXOL 300 MG/ML  SOLN COMPARISON:  07/23/2019 FINDINGS: Lower chest: No acute abnormality. Hepatobiliary: No focal liver abnormality is seen. Status post cholecystectomy. No biliary dilatation. Pancreas: There is peripancreatic inflammation reflected by stranding and hazy increased attenuation in the peripancreatic fat that extends along the second and third portion of the duodenum and gastrohepatic ligament as well as the root of the small bowel mesentery. Mild inflammation is noted along the right anterior pararenal fascia. Pancreas shows homogeneous enhancement. There is irregular duct dilation, maximum 6 mm. No pancreatic mass. No collection to suggest a pseudocyst or abscess. Spleen: Normal in size without focal abnormality. Adrenals/Urinary Tract: No adrenal masses. Bilateral renal cortical thinning. Small low-density renal lesions are noted, largest anterior upper pole of the left kidney, 11 mm, consistent with cysts, and without change from the prior CT. Small nonobstructing stone  in the upper pole the left kidney. Bilateral vascular calcifications. No hydronephrosis. Normal ureters. Normal bladder. Stomach/Bowel: Normal stomach. Small bowel and colon are normal in caliber. No wall thickening. No inflammation. Normal appendix visualized. Vascular/Lymphatic: Patent portal vein, splenic vein and superior mesenteric vein. Aortic and branch vessel atherosclerosis. No aneurysm. There are subcentimeter peripancreatic and gastrohepatic ligament lymph nodes. Reproductive: Normal uterus. Bilateral tubal ligation clips, stable. No adnexal masses. Other: No ascites. There is increased attenuation in the subcutaneous  fat of the lower abdomen similar to the prior CT. Small fat containing umbilical hernia. Musculoskeletal: No fracture or acute finding. No osteoblastic or osteolytic lesions. Previous right proximal femur ORIF with a compression screw. Chronic appearing avascular necrosis along the superior right femoral head. IMPRESSION: 1. Acute uncomplicated pancreatitis. No evidence of pancreatic necrosis or of an abscess or pseudocyst. No venous thrombosis. 2. No other acute abnormality within the abdomen or pelvis. 3. Aortic atherosclerosis. Electronically Signed   By: Lajean Manes M.D.   On: 12/26/2019 13:15   DG Chest Port 1 View  Result Date: 01/01/2020 CLINICAL DATA:  Short of breath, diabetes, negative COVID-19 EXAM: PORTABLE CHEST 1 VIEW COMPARISON:  May 13 21 FINDINGS: Single frontal view of the chest demonstrates persistent enlargement of the cardiac silhouette. Mild diffuse interstitial prominence without focal consolidation, effusion, or pneumothorax. Subsegmental atelectasis right upper lobe. No acute bony abnormalities. IMPRESSION: 1. Stable enlarged cardiac silhouette and interstitial prominence consistent with interstitial edema. Electronically Signed   By: Randa Ngo M.D.   On: 01/01/2020 16:11   DG Chest Port 1 View  Result Date: 12/29/2019 CLINICAL DATA:  Acute onset shortness of breath. Known pancreatitis. EXAM: PORTABLE CHEST 1 VIEW COMPARISON:  One-view chest x-ray 12/26/2019 FINDINGS: Heart is enlarged. Mild interstitial pattern has increased slightly. Fluid is noted in the right minor fissure. No significant airspace consolidation is present. IMPRESSION: Cardiomegaly with slight increase in interstitial pattern, likely reflecting mild edema. No focal airspace disease or consolidation. Electronically Signed   By: San Morelle M.D.   On: 12/29/2019 04:53   DG Chest Port 1 View  Result Date: 12/26/2019 CLINICAL DATA:  Epigastric abdominal pain and vomiting. EXAM: PORTABLE CHEST 1  VIEW COMPARISON:  07/31/2019 FINDINGS: The heart is enlarged but stable. Stable tortuosity of the thoracic aorta. Stable eventration of the left hemidiaphragm with some overlying vascular crowding and atelectasis. No infiltrates or effusions. IMPRESSION: 1. Stable cardiac enlargement. 2. No acute pulmonary findings. Electronically Signed   By: Marijo Sanes M.D.   On: 12/26/2019 09:45    Microbiology: Recent Results (from the past 240 hour(s))  SARS Coronavirus 2 by RT PCR (hospital order, performed in [facility] hospital lab) Nasopharyngeal Nasopharyngeal Swab     Status: None   Collection Time: 12/26/19 11:36 AM   Specimen: Nasopharyngeal Swab  Result Value Ref Range Status   SARS Coronavirus 2 NEGATIVE NEGATIVE Final    Comment: (NOTE) SARS-CoV-2 target nucleic acids are NOT DETECTED.  The SARS-CoV-2 RNA is generally detectable in upper and lower respiratory specimens during the acute phase of infection. The lowest concentration of SARS-CoV-2 viral copies this assay can detect is 250 copies / mL. A negative result does not preclude SARS-CoV-2 infection and should not be used as the sole basis for treatment or other patient management decisions.  A negative result may occur with improper specimen collection / handling, submission of specimen other than nasopharyngeal swab, presence of viral mutation(s) within the areas targeted by this assay, and inadequate number of  viral copies (<250 copies / mL). A negative result must be combined with clinical observations, patient history, and epidemiological information.  Fact Sheet for Patients:   StrictlyIdeas.no  Fact Sheet for Healthcare Providers: BankingDealers.co.za  This test is not yet approved or  cleared by the [place] FDA and has been authorized for detection and/or diagnosis of SARS-CoV-2 by FDA under an Emergency Use Authorization (EUA).  This EUA will remain in effect  (meaning this test can be used) for the duration of the COVID-19 declaration under Section 564(b)(1) of the Act, 21 U.S.C. section 360bbb-3(b)(1), unless the authorization is terminated or revoked sooner.  Performed at [facility], [address], [place],  [address]      Labs: Basic Metabolic Panel: Recent Labs  Lab 12/29/19 0556 12/30/19 0709 12/31/19 0718 01/01/20 0530 01/03/20 0707  NA 141 140 137 137 139  K 4.2 4.0 4.0 4.1 3.9  CL 111 110 106 106 107  CO2 20* 23 24 24 26   GLUCOSE 178* 82 184* 209* 180*  BUN 39* 38* 42* 36* 23  CREATININE 1.82* 1.63* 1.91* 1.62* 1.30*  CALCIUM 8.1* 8.2* 7.9* 7.9* 8.2*    Recent Labs  Lab 12/28/19 0621 12/29/19 0556  LIPASE 120* 23   CBC: Recent Labs  Lab 12/29/19 0556 12/30/19 0709 12/31/19 0718 01/01/20 0530 01/03/20 0707  WBC 15.8* 13.0* 11.5* 10.2 8.5  HGB 9.6* 9.5* 8.9* 9.0* 8.6*  HCT 31.9* 30.8* 29.4* 29.6* 27.6*  MCV 93.3 92.2 92.2 91.4 90.2  PLT 222 231 229 247 311   BNP (last 3 results) Recent Labs    06/23/19 1458 07/23/19 1321 12/26/19 0818  BNP 118.0* 104.0* 116.0*   CBG: Recent Labs  Lab 01/02/20 1619 01/02/20 1945 01/03/20 0021 01/03/20 0747 01/03/20 1131  GLUCAP 212* 233* 198* 173* 180*    Signed:  Barton Dubois MD.  [facility] 01/03/2020, 1:57 PM

## 2020-01-04 ENCOUNTER — Other Ambulatory Visit: Payer: Self-pay

## 2020-01-04 DIAGNOSIS — E119 Type 2 diabetes mellitus without complications: Secondary | ICD-10-CM | POA: Diagnosis not present

## 2020-01-04 DIAGNOSIS — J811 Chronic pulmonary edema: Secondary | ICD-10-CM | POA: Diagnosis not present

## 2020-01-04 DIAGNOSIS — E785 Hyperlipidemia, unspecified: Secondary | ICD-10-CM | POA: Diagnosis not present

## 2020-01-04 DIAGNOSIS — I4891 Unspecified atrial fibrillation: Secondary | ICD-10-CM | POA: Diagnosis not present

## 2020-01-04 DIAGNOSIS — I5030 Unspecified diastolic (congestive) heart failure: Secondary | ICD-10-CM | POA: Diagnosis not present

## 2020-01-04 DIAGNOSIS — I2510 Atherosclerotic heart disease of native coronary artery without angina pectoris: Secondary | ICD-10-CM | POA: Diagnosis not present

## 2020-01-04 DIAGNOSIS — I10 Essential (primary) hypertension: Secondary | ICD-10-CM | POA: Diagnosis not present

## 2020-01-04 DIAGNOSIS — K859 Acute pancreatitis without necrosis or infection, unspecified: Secondary | ICD-10-CM | POA: Diagnosis not present

## 2020-01-04 NOTE — Patient Outreach (Addendum)
[facility]) Care Management  01/04/2020  [name] 11/23/1934 [id number]     Transition of Care Referral  Referral Date: 01/04/2020  Referral Source: [facility] Discharge Report Date of Discharge: 01/03/2020 Facility: [place]    Referral received. Per EMR patient discharged to SNF.   Plan: RN CM will close case at this time.   Enzo Montgomery, RN,BSN,CCM [place] Management Telephonic Care Management Coordinator Direct Phone: [phone] Toll Free: [phone] Fax: [phone]

## 2020-01-10 DIAGNOSIS — K859 Acute pancreatitis without necrosis or infection, unspecified: Secondary | ICD-10-CM | POA: Diagnosis not present

## 2020-01-11 DIAGNOSIS — Z7189 Other specified counseling: Secondary | ICD-10-CM | POA: Insufficient documentation

## 2020-01-11 DIAGNOSIS — I10 Essential (primary) hypertension: Secondary | ICD-10-CM | POA: Diagnosis not present

## 2020-01-11 DIAGNOSIS — E785 Hyperlipidemia, unspecified: Secondary | ICD-10-CM | POA: Diagnosis not present

## 2020-01-11 DIAGNOSIS — E119 Type 2 diabetes mellitus without complications: Secondary | ICD-10-CM | POA: Diagnosis not present

## 2020-01-11 DIAGNOSIS — R7309 Other abnormal glucose: Secondary | ICD-10-CM | POA: Diagnosis not present

## 2020-01-11 DIAGNOSIS — Z79899 Other long term (current) drug therapy: Secondary | ICD-10-CM | POA: Diagnosis not present

## 2020-01-11 DIAGNOSIS — I4891 Unspecified atrial fibrillation: Secondary | ICD-10-CM | POA: Diagnosis not present

## 2020-01-11 NOTE — Progress Notes (Signed)
Cardiology Office Note   Date:  01/12/2020   ID:  [name], DOB 24-Jan-1935, MRN [id number]  PCP:  [name], MD  Cardiologist:   Minus Breeding, MD   Chief Complaint  Patient presents with  . Edema      History of Present Illness: [name] is a 85 y.o. female who presents for follow up of chronic diastolic HF.  She was previously seen by Dr. Bronson Ing.  She was hospitalized in March 2021 for abdominal pain and vomiting and was found to have acute pancreatitis.  She developed new onset rapid atrial fibrillation. She was anticoagulated with Eliquis.  She converted back to sinus rhythm and was eventually placed on Cardizem CD.  Since she was last seen seen she was in the hospital earlier this month with pancreatitis again. I reviewed these records for this visit.    The etiology of this is not clear.  She did have volume overload with diastolic heart failure.  I do not see that she was in atrial fibrillation however.  She does have chronic renal insufficiency.  She did have some acute urinary retention but did not need an indwelling Foley.  She was treated with Flomax.  Because of debilitation she was sent home to a rehab center and she is not sure how long she is staying.  I do note that her weight is up 20 pounds from where it was previously.  She says she is working with physical therapy a couple of times a day.  She gets up with them with a walker.  She has not been having any PND or orthopnea but she has had some increased swelling.  At home she sleeps either in a hospital bed or in a recliner.  She is not having any chest pressure, neck or arm discomfort.  She never notices any palpitations, presyncope or syncope.  She is on low-dose blood thinner and she is not had any obvious GI bleeding.  Past Medical History:  Diagnosis Date  . Arthritis   . Chronic diastolic heart failure ([place])   . CKD (chronic kidney disease) stage 3, GFR 30-59 ml/min   . Essential hypertension    . GERD (gastroesophageal reflux disease)   . Hemorrhoids   . History of stroke   . Type 2 diabetes mellitus ([place])     Past Surgical History:  Procedure Laterality Date  . BACK SURGERY    . CATARACT EXTRACTION W/PHACO Left 02/28/2013   Procedure: CATARACT EXTRACTION PHACO AND INTRAOCULAR LENS PLACEMENT (IOL) CDE=15.60;  Surgeon: Elta Guadeloupe T. Gershon Crane, MD;  Location: [facility];  Service: Ophthalmology;  Laterality: Left;  . CHOLECYSTECTOMY    . COLONOSCOPY  12/12/2003   RMR: Normal rectum.  Long capacious tortuous colon, colonic mucosa appeared normal  . COLONOSCOPY N/A 03/21/2014   Dr. [name]: internal and external hemorrhoids, torturous colon, colonic diverticulosis   . ESOPHAGOGASTRODUODENOSCOPY  12/28/2001   MEQ:[facility] sliding hiatal hernia/. Three small bulbar ulcers, two with stigmata of bleeding and these were coagulated using heater probe unit   . ESOPHAGOGASTRODUODENOSCOPY N/A 03/21/2014   Dr. [name]: cervical esophageal web s/p dilation, negative H.pylori   . EYE SURGERY    . HIP FRACTURE SURGERY  2008  . JOINT REPLACEMENT     Rt hip, ????? sounds like just for fracture  . MALONEY DILATION N/A 03/21/2014   Procedure: [name] DILATION;  Surgeon: [name], MD;  Location: [facility];  Service: Endoscopy;  Laterality: N/A;  .  SAVORY DILATION N/A 03/21/2014   Procedure: SAVORY DILATION;  Surgeon: [name], MD;  Location: [facility];  Service: Endoscopy;  Laterality: N/A;     Current Outpatient Medications  Medication Sig Dispense Refill  . acetaminophen (TYLENOL) 325 MG tablet Take 2 tablets (650 mg total) by mouth every 6 (six) hours as needed for mild pain, fever or headache (or Fever >/= 101). 12 tablet 0  . apixaban (ELIQUIS) 2.5 MG TABS tablet Take 1 tablet (2.5 mg total) by mouth 2 (two) times daily. 60 tablet 2  . diltiazem (CARDIZEM CD) 120 MG 24 hr capsule Take 1 capsule (120 mg total) by mouth daily. 30 capsule 3  . famotidine (PEPCID) 20 MG tablet Take 1 tablet  (20 mg total) by mouth daily.    Marland Kitchen gabapentin (NEURONTIN) 300 MG capsule Take 300 mg by mouth 2 (two) times daily.     . hydrALAZINE (APRESOLINE) 50 MG tablet Take 1.5 tablets (75 mg total) by mouth 2 (two) times daily. 90 tablet 11  . hydrocortisone (ANUSOL-HC) 2.5 % rectal cream Place 1 application rectally 2 (two) times daily as needed for hemorrhoids or anal itching.    . insulin detemir (LEVEMIR) 100 UNIT/ML injection Inject 0.7 mLs (70 Units total) into the skin at bedtime. (Patient taking differently: Inject 80 Units into the skin at bedtime. ) 10 mL 11  . isosorbide mononitrate (IMDUR) 60 MG 24 hr tablet Take 0.5 tablets (30 mg total) by mouth daily. (Patient taking differently: Take 60 mg by mouth daily. )    . ondansetron (ZOFRAN) 4 MG tablet Take 4 mg by mouth every 6 (six) hours as needed for nausea or vomiting.    . rosuvastatin (CRESTOR) 5 MG tablet Take 5 mg by mouth daily.    . tamsulosin (FLOMAX) 0.4 MG CAPS capsule Take 1 capsule (0.4 mg total) by mouth daily after supper.    . torsemide (DEMADEX) 20 MG tablet Take 1 tablet (20 mg total) by mouth 2 (two) times daily. 60 tablet 3  . insulin aspart (NOVOLOG) 100 UNIT/ML FlexPen Inject 0-10 Units into the skin 3 (three) times daily with meals. insulin aspart (novoLOG) injection 0-10 Units 0-10 Units Subcutaneous, 3 times daily with meals CBG < 70: Implement Hypoglycemia Standing Orders and refer to Hypoglycemia Standing Orders sidebar report  CBG 70 - 120: 0 unit CBG 121 - 150: 0 unit  CBG 151 - 200: 1 unit CBG 201 - 250: 2 units CBG 251 - 300: 4 units CBG 301 - 350: 6 units  CBG 351 - 400: 8 units  CBG > 400: 10 units 15 mL 11   No current facility-administered medications for this visit.   Facility-Administered Medications Ordered in Other Visits  Medication Dose Route Frequency Provider Last Rate Last Admin  . regadenoson (LEXISCAN) injection SOLN 0.4 mg  0.4 mg Intravenous Once Croitoru, Mihai, MD        Allergies:   Patient has  no known allergies.   ROS:  Please see the history of present illness.   Otherwise, review of systems are positive for none.   All other systems are reviewed and negative.    PHYSICAL EXAM: VS:  BP 134/70   Pulse 72   Ht 5\' 5"  (1.651 m)   Wt 246 lb (111.6 kg)   SpO2 96%   BMI 40.94 kg/m  , BMI Body mass index is 40.94 kg/m. GEN:  No distress NECK:  No jugular venous distention at 90 degrees, waveform  within normal limits, carotid upstroke brisk and symmetric, no bruits, no thyromegaly LYMPHATICS:  No cervical adenopathy LUNGS:  Clear to auscultation bilaterally BACK:  No CVA tenderness CHEST:  Unremarkable HEART:  S1 and S2 within normal limits, no S3, no S4, no clicks, no rubs, no murmurs ABD:  Positive bowel sounds normal in frequency in pitch, no bruits, no rebound, no guarding, unable to assess midline mass or bruit with the patient seated. EXT:  2 plus pulses throughout, moderate to severe bilateral leg and abdomen edema, no cyanosis no clubbing SKIN:  No rashes no nodules NEURO:  Cranial nerves II through XII grossly intact, motor grossly intact throughout PSYCH:  Cognitively intact, oriented to person place and time   EKG:  EKG is not ordered today. The ekg ordered 12/26/19 demonstrates sinus rhythm, rate 84, interventricular conduction delay with ectopy.   Recent Labs: 07/23/2019: Magnesium 2.2 07/24/2019: TSH 0.420 12/26/2019: B Natriuretic Peptide 116.0 12/27/2019: ALT 12 01/03/2020: BUN 23; Creatinine, Ser 1.30; Hemoglobin 8.6; Platelets 311; Potassium 3.9; Sodium 139    Lipid Panel    Component Value Date/Time   CHOL 118 12/27/2019 0425   TRIG 52 12/27/2019 0425   HDL 33 (L) 12/27/2019 0425   CHOLHDL 3.6 12/27/2019 0425   VLDL 10 12/27/2019 0425   LDLCALC 75 12/27/2019 0425      Wt Readings from Last 3 Encounters:  01/12/20 246 lb (111.6 kg)  01/03/20 239 lb 6.7 oz (108.6 kg)  10/09/19 224 lb (101.6 kg)      Other studies Reviewed: Additional studies/  records that were reviewed today include: Hospital records. Review of the above records demonstrates:  Please see elsewhere in the note.     ASSESSMENT AND PLAN:  Chronic diastolic heart failure:    It looks like she was sent home on torsemide 20 mg twice daily.  I am going to make this 40 mg twice daily for 5 days.  She will then go back down to the 20 mg twice daily.  She will need a basic metabolic profile in about 1 week.  She needs to come back in a week to 10 days for further management and to review her volume.  We are going to have to watch her renal function closely.    Paroxysmal atrial fibrillation:   I do not see that she is having any paroxysms.  When she comes back in follow-up I would like to order a 4-week event monitor probably to be placed when she gets home.  We can then decide whether she needs chronic anticoagulation as the only episode of fibrillation was when she was in the hospital acutely ill.  I did review the records and she did not have any atrial fibrillation this admission.  She is on a lower dose anticoagulation because her creatinine was above 1.5 although it did come back down by the time of discharge.  I think this is still the appropriate dose for now.   Hypertension:   Her blood pressure is controlled in the context of managing the above.   Chronic kidney disease stage III: As above we will keep a close eye on this.  Her discharge creatinine was 1.3.   Covid education: She has been vaccinated.   Current medicines are reviewed at length with the patient today.  The patient does not have concerns regarding medicines.  The following changes have been made: As above  Labs/ tests ordered today include:   Orders Placed This Encounter  Procedures  .  Basic Metabolic Panel (BMET)     Disposition:   FU with NP in 7 - 10 days.      Signed, Minus Breeding, MD  01/12/2020 1:14 PM    [facility]

## 2020-01-12 ENCOUNTER — Ambulatory Visit (INDEPENDENT_AMBULATORY_CARE_PROVIDER_SITE_OTHER): Payer: Medicare HMO | Admitting: Cardiology

## 2020-01-12 ENCOUNTER — Encounter: Payer: Self-pay | Admitting: Cardiology

## 2020-01-12 ENCOUNTER — Other Ambulatory Visit: Payer: Self-pay

## 2020-01-12 VITALS — BP 134/70 | HR 72 | Ht 65.0 in | Wt 246.0 lb

## 2020-01-12 DIAGNOSIS — I480 Paroxysmal atrial fibrillation: Secondary | ICD-10-CM

## 2020-01-12 DIAGNOSIS — Z7189 Other specified counseling: Secondary | ICD-10-CM

## 2020-01-12 DIAGNOSIS — I5032 Chronic diastolic (congestive) heart failure: Secondary | ICD-10-CM

## 2020-01-12 DIAGNOSIS — N1831 Chronic kidney disease, stage 3a: Secondary | ICD-10-CM

## 2020-01-12 DIAGNOSIS — I10 Essential (primary) hypertension: Secondary | ICD-10-CM | POA: Diagnosis not present

## 2020-01-12 DIAGNOSIS — I447 Left bundle-branch block, unspecified: Secondary | ICD-10-CM | POA: Diagnosis not present

## 2020-01-12 NOTE — Patient Instructions (Addendum)
Your physician recommends that you schedule a follow-up appointment in: [place], NP  Your physician has recommended you make the following change in your medication:   INCREASE TORSEMIDE 40 MG TWICE DAILY FOR 5 DAYS THEN RESUME 25 MG TWICE DAILY   Your physician recommends that you return for lab work in: 7 DAYS BMP  Thank you for choosing [facility]!!

## 2020-01-15 DIAGNOSIS — E119 Type 2 diabetes mellitus without complications: Secondary | ICD-10-CM | POA: Diagnosis not present

## 2020-01-15 DIAGNOSIS — R7309 Other abnormal glucose: Secondary | ICD-10-CM | POA: Diagnosis not present

## 2020-01-15 DIAGNOSIS — I2510 Atherosclerotic heart disease of native coronary artery without angina pectoris: Secondary | ICD-10-CM | POA: Diagnosis not present

## 2020-01-15 DIAGNOSIS — I5030 Unspecified diastolic (congestive) heart failure: Secondary | ICD-10-CM | POA: Diagnosis not present

## 2020-01-15 DIAGNOSIS — E785 Hyperlipidemia, unspecified: Secondary | ICD-10-CM | POA: Diagnosis not present

## 2020-01-18 DIAGNOSIS — J810 Acute pulmonary edema: Secondary | ICD-10-CM | POA: Diagnosis not present

## 2020-01-18 DIAGNOSIS — I4891 Unspecified atrial fibrillation: Secondary | ICD-10-CM | POA: Diagnosis not present

## 2020-01-18 DIAGNOSIS — N1832 Chronic kidney disease, stage 3b: Secondary | ICD-10-CM | POA: Diagnosis not present

## 2020-01-18 DIAGNOSIS — E785 Hyperlipidemia, unspecified: Secondary | ICD-10-CM | POA: Diagnosis not present

## 2020-01-18 DIAGNOSIS — K859 Acute pancreatitis without necrosis or infection, unspecified: Secondary | ICD-10-CM | POA: Diagnosis not present

## 2020-01-18 DIAGNOSIS — I5032 Chronic diastolic (congestive) heart failure: Secondary | ICD-10-CM | POA: Diagnosis not present

## 2020-01-18 DIAGNOSIS — G629 Polyneuropathy, unspecified: Secondary | ICD-10-CM | POA: Diagnosis not present

## 2020-01-18 DIAGNOSIS — I2510 Atherosclerotic heart disease of native coronary artery without angina pectoris: Secondary | ICD-10-CM | POA: Diagnosis not present

## 2020-01-18 DIAGNOSIS — G8929 Other chronic pain: Secondary | ICD-10-CM | POA: Diagnosis not present

## 2020-01-18 DIAGNOSIS — J9601 Acute respiratory failure with hypoxia: Secondary | ICD-10-CM | POA: Diagnosis not present

## 2020-01-18 DIAGNOSIS — M6281 Muscle weakness (generalized): Secondary | ICD-10-CM | POA: Diagnosis not present

## 2020-01-18 DIAGNOSIS — M25562 Pain in left knee: Secondary | ICD-10-CM | POA: Diagnosis not present

## 2020-01-18 DIAGNOSIS — I5030 Unspecified diastolic (congestive) heart failure: Secondary | ICD-10-CM | POA: Diagnosis not present

## 2020-01-18 DIAGNOSIS — M25561 Pain in right knee: Secondary | ICD-10-CM | POA: Diagnosis not present

## 2020-01-18 DIAGNOSIS — I10 Essential (primary) hypertension: Secondary | ICD-10-CM | POA: Diagnosis not present

## 2020-01-22 DIAGNOSIS — R7309 Other abnormal glucose: Secondary | ICD-10-CM | POA: Diagnosis not present

## 2020-01-22 DIAGNOSIS — I2510 Atherosclerotic heart disease of native coronary artery without angina pectoris: Secondary | ICD-10-CM | POA: Diagnosis not present

## 2020-01-22 DIAGNOSIS — I5030 Unspecified diastolic (congestive) heart failure: Secondary | ICD-10-CM | POA: Diagnosis not present

## 2020-01-22 DIAGNOSIS — J811 Chronic pulmonary edema: Secondary | ICD-10-CM | POA: Diagnosis not present

## 2020-01-22 DIAGNOSIS — I10 Essential (primary) hypertension: Secondary | ICD-10-CM | POA: Diagnosis not present

## 2020-01-22 DIAGNOSIS — K859 Acute pancreatitis without necrosis or infection, unspecified: Secondary | ICD-10-CM | POA: Diagnosis not present

## 2020-01-22 DIAGNOSIS — E119 Type 2 diabetes mellitus without complications: Secondary | ICD-10-CM | POA: Diagnosis not present

## 2020-01-22 DIAGNOSIS — I4891 Unspecified atrial fibrillation: Secondary | ICD-10-CM | POA: Diagnosis not present

## 2020-01-24 ENCOUNTER — Other Ambulatory Visit: Payer: Self-pay

## 2020-01-24 NOTE — Patient Outreach (Signed)
[place] [facility]) Care Management  01/24/2020  [name] Aug 01, 1934 [id number]     Transition of Care Referral  Referral Date: 01/24/20 Referral Source: [facility] Discharge Report Date of Discharge: 8/55/01 Facility: [place]: [facility]    Outreach attempt # 1 #  to patient. No answer after multiple rings and unable to leave message.     Plan: RN CM will make outreach attempt to patient within 3-4 business days. RN CM will send unsuccessful outreach letter to patient.   Enzo Montgomery, RN,BSN,CCM [place] Management Telephonic Care Management Coordinator Direct Phone: [phone] Toll Free: [phone] Fax: [phone]

## 2020-01-25 ENCOUNTER — Other Ambulatory Visit: Payer: Self-pay

## 2020-01-25 NOTE — Patient Outreach (Signed)
[place] [facility]) Care Management  01/25/2020  JENELLE DRENNON 1934/11/23 [id number]   Transition of Care Referral  Referral Date: 01/24/20 Referral Source: [facility] Discharge Report Date of Discharge: 6/77/03 Facility: [place]: [facility]   Outreach attempt #2 to patient. Spoke with patient who reported she was doing good. She states she is still a little weak but other than that everything going well. She has supportive family assisting her with her needs. She reports that her daughter manages her meds and MD appts. Patient confirms daughter has made MD follow up appts and will be taking her to appts. She denies an RN CM or [facility] needs or concerns at this time.      Plan: RN CM will close case at this time.   Enzo Montgomery, RN,BSN,CCM [place] Management Telephonic Care Management Coordinator Direct Phone: [phone] Toll Free: [phone] Fax: [phone]

## 2020-01-25 NOTE — Progress Notes (Signed)
Cardiology Office Note  Date: 01/26/2020   ID: [name], DOB [birth year], MRN [id number]  PCP:  [name], MD  Cardiologist:  Minus Breeding, MD Electrophysiologist:  None   Chief Complaint: Follow-up chronic diastolic heart failure  History of Present Illness: [name] is a 85 y.o. female with a history of chronic diastolic heart failure, atrial fibrillation, CKD,  Last encounter on 01/12/2020 with Dr. Percival Spanish for follow-up related to chronic diastolic heart failure.  She recently had a hospital admission for pancreatitis. Had volume overload with diastolic heart failure.  Had acute urinary retention treated with Flomax.  She was sent to rehab center.  Weight was noted to be up 20 pounds from previous measurement.  She had some increased swelling.  Dr. Percival Spanish increased her torsemide to 40 mg twice daily for 5 days.  She was then to revert back to 20 mg twice daily.  BMP to be performed in 1 week.  She was to follow-up in 10 days for further management and review volume status.  Need to watch renal function closely.  She was having paroxysms of atrial fibrillation.  Upon follow-up she was to have a 4-week event monitor placed.  Decision then could be made whether she needed chronic anticoagulation.  She was on lower dose anticoagulation due to creatinine greater than 1.5.  However, the creatinine had decreased by the time of discharge. Her BP was controlled. CKD at discharge Crt 1.3.  Follow-up BMP was to be done in 1 week.  She presents today for follow-up.  She was just discharged from [facility] facility in [place].  She was unable to weigh today due to inability to stand for a short period.  She is in a wheelchair today.  She denies any palpitations or arrhythmias, fluttering heart, or racing heart.  Denies any orthostatic symptoms, PND, orthopnea.  She does continue with chronic lower extremity edema.  Denies any exertional dyspnea but is not very active on a  daily basis.  She uses a walker to ambulate.  It appears follow-up BMP was not done at this time.  We will request the patient have this done when she leaves here today.  Blood pressure is elevated today at 168/80, however blood pressure on 01/12/2020 visit was 134/70.  Past Medical History:  Diagnosis Date  . Arthritis   . Chronic diastolic heart failure ([place])   . CKD (chronic kidney disease) stage 3, GFR 30-59 ml/min   . Essential hypertension   . GERD (gastroesophageal reflux disease)   . Hemorrhoids   . History of stroke   . Type 2 diabetes mellitus ([place])     Past Surgical History:  Procedure Laterality Date  . BACK SURGERY    . CATARACT EXTRACTION W/PHACO Left 02/28/2013   Procedure: CATARACT EXTRACTION PHACO AND INTRAOCULAR LENS PLACEMENT (IOL) CDE=15.60;  Surgeon: Elta Guadeloupe T. Gershon Crane, MD;  Location: [facility];  Service: Ophthalmology;  Laterality: Left;  . CHOLECYSTECTOMY    . COLONOSCOPY  12/12/2003   RMR: Normal rectum.  Long capacious tortuous colon, colonic mucosa appeared normal  . COLONOSCOPY N/A 03/21/2014   Dr. [name]: internal and external hemorrhoids, torturous colon, colonic diverticulosis   . ESOPHAGOGASTRODUODENOSCOPY  12/28/2001   EXH:BZJIR sliding hiatal hernia/. Three small bulbar ulcers, two with stigmata of bleeding and these were coagulated using heater probe unit   . ESOPHAGOGASTRODUODENOSCOPY N/A 03/21/2014   Dr. [name]: cervical esophageal web s/p dilation, negative H.pylori   . EYE SURGERY    .  HIP FRACTURE SURGERY  2008  . JOINT REPLACEMENT     Rt hip, ????? sounds like just for fracture  . MALONEY DILATION N/A 03/21/2014   Procedure: [name] DILATION;  Surgeon: [name], MD;  [facility];  Service: Endoscopy;  Laterality: N/A;  . SAVORY DILATION N/A 03/21/2014   Procedure: SAVORY DILATION;  Surgeon: [name], MD;  [facility]: [facility];  Service: Endoscopy;  Laterality: N/A;    Current Outpatient Medications  Medication Sig Dispense  Refill  . acetaminophen (TYLENOL) 325 MG tablet Take 2 tablets (650 mg total) by mouth every 6 (six) hours as needed for mild pain, fever or headache (or Fever >/= 101). 12 tablet 0  . apixaban (ELIQUIS) 2.5 MG TABS tablet Take 1 tablet (2.5 mg total) by mouth 2 (two) times daily. 60 tablet 2  . diltiazem (CARDIZEM CD) 120 MG 24 hr capsule Take 1 capsule (120 mg total) by mouth daily. 30 capsule 3  . famotidine (PEPCID) 20 MG tablet Take 1 tablet (20 mg total) by mouth daily.    Marland Kitchen gabapentin (NEURONTIN) 300 MG capsule Take 300 mg by mouth 2 (two) times daily.     . hydrALAZINE (APRESOLINE) 50 MG tablet Take 1.5 tablets (75 mg total) by mouth 2 (two) times daily. 90 tablet 11  . hydrocortisone (ANUSOL-HC) 2.5 % rectal cream Place 1 application rectally 2 (two) times daily as needed for hemorrhoids or anal itching.    . insulin aspart (NOVOLOG) 100 UNIT/ML FlexPen Inject 0-10 Units into the skin 3 (three) times daily with meals. insulin aspart (novoLOG) injection 0-10 Units 0-10 Units Subcutaneous, 3 times daily with meals CBG < 70: Implement Hypoglycemia Standing Orders and refer to Hypoglycemia Standing Orders sidebar report  CBG 70 - 120: 0 unit CBG 121 - 150: 0 unit  CBG 151 - 200: 1 unit CBG 201 - 250: 2 units CBG 251 - 300: 4 units CBG 301 - 350: 6 units  CBG 351 - 400: 8 units  CBG > 400: 10 units 15 mL 11  . insulin detemir (LEVEMIR) 100 UNIT/ML injection Inject 0.7 mLs (70 Units total) into the skin at bedtime. (Patient taking differently: Inject 80 Units into the skin at bedtime. ) 10 mL 11  . isosorbide mononitrate (IMDUR) 60 MG 24 hr tablet Take 0.5 tablets (30 mg total) by mouth daily. (Patient taking differently: Take 60 mg by mouth daily. )    . ondansetron (ZOFRAN) 4 MG tablet Take 4 mg by mouth every 6 (six) hours as needed for nausea or vomiting.    . rosuvastatin (CRESTOR) 5 MG tablet Take 5 mg by mouth daily.    . tamsulosin (FLOMAX) 0.4 MG CAPS capsule Take 1 capsule (0.4 mg total) by  mouth daily after supper.    . torsemide (DEMADEX) 20 MG tablet Take 1 tablet (20 mg total) by mouth 2 (two) times daily. 60 tablet 3   No current facility-administered medications for this visit.   Facility-Administered Medications Ordered in Other Visits  Medication Dose Route Frequency Provider Last Rate Last Admin  . regadenoson (LEXISCAN) injection SOLN 0.4 mg  0.4 mg Intravenous Once Croitoru, Mihai, MD       Allergies:  Patient has no known allergies.   Social History: The patient  reports that she has never smoked. She has never used smokeless tobacco. She reports that she does not drink alcohol and does not use drugs.   Family History: The patient's family history includes  Crohn's disease in her daughter; Diabetes in her brother, sister, and sister; Heart attack in her sister; Hypertension in her mother and sister.   ROS:  Please see the history of present illness. Otherwise, complete review of systems is positive for none.  All other systems are reviewed and negative.   Physical Exam: VS:  BP (!) 168/80   Pulse 73   Ht 5\' 8"  (1.727 m)   SpO2 94%   BMI 37.40 kg/m , BMI Body mass index is 37.4 kg/m.  Wt Readings from Last 3 Encounters:  01/12/20 246 lb (111.6 kg)  01/03/20 239 lb 6.7 oz (108.6 kg)  10/09/19 224 lb (101.6 kg)    General: Obese patient appears comfortable at rest. Neck: Supple, no elevated JVP or carotid bruits, no thyromegaly. Lungs: Clear to auscultation, nonlabored breathing at rest. Cardiac: Regular rate and rhythm, no S3 or significant systolic murmur, no pericardial rub. Extremities: Mild pitting edema, distal pulses 2+. Skin: Warm and dry. Musculoskeletal: No kyphosis. Neuropsychiatric: Alert and oriented x3, affect grossly appropriate.  ECG:  EKG on 12/26/2019 demonstrated normal sinus rhythm with a rate of 84, right axis deviation, nonspecific intraventricular block  Recent Labwork: 07/23/2019: Magnesium 2.2 07/24/2019: TSH 0.420 12/26/2019: B  Natriuretic Peptide 116.0 12/27/2019: ALT 12; AST 12 01/03/2020: BUN 23; Creatinine, Ser 1.30; Hemoglobin 8.6; Platelets 311; Potassium 3.9; Sodium 139     Component Value Date/Time   CHOL 118 12/27/2019 0425   TRIG 52 12/27/2019 0425   HDL 33 (L) 12/27/2019 0425   CHOLHDL 3.6 12/27/2019 0425   VLDL 10 12/27/2019 0425   LDLCALC 75 12/27/2019 0425    Other Studies Reviewed Today:  NST  07/18/2019 Study Highlights   Nuclear stress EF: 55%.  The left ventricular ejection fraction is normal (55-65%).  There was no ST segment deviation noted during stress.  There is a medium defect of moderate severity present in the basal anterior, basal anteroseptal, mid anterior and mid anteroseptal location. The defect is non-reversible and consistent with breast attenuation artifact.  This is a low risk study.   Echocardiogram 06/29/2019  1. Left ventricular ejection fraction, by estimation, is 60 to 65%. The left ventricle has normal function. The left ventrical has no regional wall motion abnormalities. There is moderately increased left ventricular hypertrophy. Left ventricular diastolic parameters are consistent with Grade II diastolic dysfunction (pseudonormalization). Elevated left atrial pressure. 2. Right ventricular systolic function is normal. The right ventricular size is normal. 3. Left atrial size was severely dilated. 4. The mitral valve is normal in structure and function. trivial mitral valve regurgitation. No evidence of mitral stenosis. 5. The aortic valve is tricuspid. Aortic valve regurgitation is not visualized. No aortic stenosis is present. 6. The inferior vena cava is normal in size with greater than 50% respiratory variability, suggesting right atrial pressure of 3 mmHg   Assessment and Plan:  1. Chronic diastolic heart failure (HCC)   2. Paroxysmal atrial fibrillation ([place])   3. Essential hypertension    1. Chronic diastolic heart failure ([place]) At last visit  Dr. Percival Spanish increased patient's torsemide to 40 mg p.o. back twice daily twice daily for 5 days and then to revert back to 20 mg p.o. twice daily.  Patient was unable to weigh today due to with weakness.  She does not remember what she weighed when she was discharged from [facility] recently.  Please get BMP and magnesium for follow-up.  Continues with some chronic mild pitting edema in bilateral lower extremities.  Continue torsemide 20 mg p.o. twice daily.  Continue Imdur 30 mg daily.  2. Paroxysmal atrial fibrillation (HCC) Recent paroxysmal atrial fibrillation during hospitalization.  Get a 30-day cardiac monitor to assess atrial fibrillation burden.  Continue Eliquis 2.5 mg p.o. twice daily.  Continue diltiazem CD 120 mg p.o. daily.  3. Essential hypertension Blood pressure is elevated today at 168/80.  However at previous visit on August 27 blood pressure was 134/70.  Continue to monitor continue hydralazine 1.5 tablets (75 mg) p.o. twice daily.  4 CKD stage III Recent creatinine 1.30 and GFR 44 01/03/2020.  Repeat BMP and magnesium.  Medication Adjustments/Labs and Tests Ordered: Current medicines are reviewed at length with the patient today.  Concerns regarding medicines are outlined above.   Disposition: Follow-up with Dr. Percival Spanish or APP in 2 months after cardiac monitor  Signed, Levell July, NP 01/26/2020 11:09 AM    [facility] [place], [place], [place] [address] Phone: [phone]; Fax: [phone]

## 2020-01-26 ENCOUNTER — Other Ambulatory Visit: Payer: Self-pay

## 2020-01-26 ENCOUNTER — Ambulatory Visit (INDEPENDENT_AMBULATORY_CARE_PROVIDER_SITE_OTHER): Payer: Medicare HMO | Admitting: Family Medicine

## 2020-01-26 ENCOUNTER — Encounter: Payer: Self-pay | Admitting: Family Medicine

## 2020-01-26 VITALS — BP 168/80 | HR 73 | Ht 68.0 in

## 2020-01-26 DIAGNOSIS — I480 Paroxysmal atrial fibrillation: Secondary | ICD-10-CM

## 2020-01-26 DIAGNOSIS — I10 Essential (primary) hypertension: Secondary | ICD-10-CM | POA: Diagnosis not present

## 2020-01-26 DIAGNOSIS — I5032 Chronic diastolic (congestive) heart failure: Secondary | ICD-10-CM

## 2020-01-26 NOTE — Patient Instructions (Addendum)
Medication Instructions:  Continue all current medications.  Labwork:  BMET, Magnesium - orders given today.   Office will contact with results via phone or letter.    Testing/Procedures:  Your physician has recommended that you wear a 30 day event monitor. Event monitors are medical devices that record the heart's electrical activity. Doctors most often [place] these monitors to diagnose arrhythmias. Arrhythmias are problems with the speed or rhythm of the heartbeat. The monitor is a small, portable device. You can wear one while you do your normal daily activities. This is usually used to diagnose what is causing palpitations/syncope (passing out).  Office will contact with results via phone or letter.    Follow-Up: 6 weeks   Any Other Special Instructions Will Be Listed Below (If Applicable).  If you need a refill on your cardiac medications before your next appointment, please call your pharmacy.

## 2020-02-01 DIAGNOSIS — E1142 Type 2 diabetes mellitus with diabetic polyneuropathy: Secondary | ICD-10-CM | POA: Diagnosis not present

## 2020-02-01 DIAGNOSIS — I5032 Chronic diastolic (congestive) heart failure: Secondary | ICD-10-CM | POA: Diagnosis not present

## 2020-02-02 ENCOUNTER — Other Ambulatory Visit: Payer: Self-pay

## 2020-02-02 ENCOUNTER — Other Ambulatory Visit (HOSPITAL_COMMUNITY)
Admission: RE | Admit: 2020-02-02 | Discharge: 2020-02-02 | Disposition: A | Discharge status: Home / Self Care | Payer: Medicare HMO | Source: Ambulatory Visit | Attending: Family Medicine | Admitting: Family Medicine

## 2020-02-02 DIAGNOSIS — I10 Essential (primary) hypertension: Secondary | ICD-10-CM | POA: Insufficient documentation

## 2020-02-02 DIAGNOSIS — I5032 Chronic diastolic (congestive) heart failure: Secondary | ICD-10-CM | POA: Insufficient documentation

## 2020-02-08 ENCOUNTER — Encounter (HOSPITAL_COMMUNITY): Payer: Self-pay

## 2020-02-08 ENCOUNTER — Emergency Department (HOSPITAL_COMMUNITY): Payer: Medicare HMO

## 2020-02-08 ENCOUNTER — Inpatient Hospital Stay (HOSPITAL_COMMUNITY)
Admission: EM | Admit: 2020-02-08 | Discharge: 2020-02-12 | DRG: 177 | Disposition: A | Discharge status: Skilled Nursing Facility | Payer: Medicare HMO | Attending: Family Medicine | Admitting: Family Medicine

## 2020-02-08 ENCOUNTER — Other Ambulatory Visit: Payer: Self-pay

## 2020-02-08 DIAGNOSIS — R509 Fever, unspecified: Secondary | ICD-10-CM | POA: Diagnosis not present

## 2020-02-08 DIAGNOSIS — N1832 Chronic kidney disease, stage 3b: Secondary | ICD-10-CM | POA: Diagnosis present

## 2020-02-08 DIAGNOSIS — E1165 Type 2 diabetes mellitus with hyperglycemia: Secondary | ICD-10-CM | POA: Diagnosis present

## 2020-02-08 DIAGNOSIS — R05 Cough: Secondary | ICD-10-CM | POA: Diagnosis not present

## 2020-02-08 DIAGNOSIS — I130 Hypertensive heart and chronic kidney disease with heart failure and stage 1 through stage 4 chronic kidney disease, or unspecified chronic kidney disease: Secondary | ICD-10-CM | POA: Diagnosis present

## 2020-02-08 DIAGNOSIS — N179 Acute kidney failure, unspecified: Secondary | ICD-10-CM | POA: Diagnosis present

## 2020-02-08 DIAGNOSIS — I480 Paroxysmal atrial fibrillation: Secondary | ICD-10-CM | POA: Diagnosis present

## 2020-02-08 DIAGNOSIS — E1142 Type 2 diabetes mellitus with diabetic polyneuropathy: Secondary | ICD-10-CM | POA: Diagnosis present

## 2020-02-08 DIAGNOSIS — R41841 Cognitive communication deficit: Secondary | ICD-10-CM | POA: Diagnosis not present

## 2020-02-08 DIAGNOSIS — R29898 Other symptoms and signs involving the musculoskeletal system: Secondary | ICD-10-CM | POA: Diagnosis not present

## 2020-02-08 DIAGNOSIS — R8281 Pyuria: Secondary | ICD-10-CM | POA: Diagnosis present

## 2020-02-08 DIAGNOSIS — I5032 Chronic diastolic (congestive) heart failure: Secondary | ICD-10-CM | POA: Diagnosis not present

## 2020-02-08 DIAGNOSIS — J1282 Pneumonia due to coronavirus disease 2019: Secondary | ICD-10-CM | POA: Diagnosis present

## 2020-02-08 DIAGNOSIS — M6281 Muscle weakness (generalized): Secondary | ICD-10-CM | POA: Diagnosis not present

## 2020-02-08 DIAGNOSIS — Z794 Long term (current) use of insulin: Secondary | ICD-10-CM | POA: Diagnosis not present

## 2020-02-08 DIAGNOSIS — E1122 Type 2 diabetes mellitus with diabetic chronic kidney disease: Secondary | ICD-10-CM | POA: Diagnosis present

## 2020-02-08 DIAGNOSIS — Z7901 Long term (current) use of anticoagulants: Secondary | ICD-10-CM | POA: Diagnosis not present

## 2020-02-08 DIAGNOSIS — M170 Bilateral primary osteoarthritis of knee: Secondary | ICD-10-CM | POA: Diagnosis not present

## 2020-02-08 DIAGNOSIS — Z789 Other specified health status: Secondary | ICD-10-CM

## 2020-02-08 DIAGNOSIS — R531 Weakness: Secondary | ICD-10-CM | POA: Diagnosis not present

## 2020-02-08 DIAGNOSIS — Z8673 Personal history of transient ischemic attack (TIA), and cerebral infarction without residual deficits: Secondary | ICD-10-CM

## 2020-02-08 DIAGNOSIS — I482 Chronic atrial fibrillation, unspecified: Secondary | ICD-10-CM | POA: Diagnosis not present

## 2020-02-08 DIAGNOSIS — M6259 Muscle wasting and atrophy, not elsewhere classified, multiple sites: Secondary | ICD-10-CM | POA: Diagnosis not present

## 2020-02-08 DIAGNOSIS — E1121 Type 2 diabetes mellitus with diabetic nephropathy: Secondary | ICD-10-CM

## 2020-02-08 DIAGNOSIS — Z833 Family history of diabetes mellitus: Secondary | ICD-10-CM | POA: Diagnosis not present

## 2020-02-08 DIAGNOSIS — Z8249 Family history of ischemic heart disease and other diseases of the circulatory system: Secondary | ICD-10-CM | POA: Diagnosis not present

## 2020-02-08 DIAGNOSIS — K219 Gastro-esophageal reflux disease without esophagitis: Secondary | ICD-10-CM | POA: Diagnosis present

## 2020-02-08 DIAGNOSIS — J189 Pneumonia, unspecified organism: Secondary | ICD-10-CM | POA: Diagnosis not present

## 2020-02-08 DIAGNOSIS — D6859 Other primary thrombophilia: Secondary | ICD-10-CM | POA: Diagnosis not present

## 2020-02-08 DIAGNOSIS — IMO0002 Reserved for concepts with insufficient information to code with codable children: Secondary | ICD-10-CM

## 2020-02-08 DIAGNOSIS — E119 Type 2 diabetes mellitus without complications: Secondary | ICD-10-CM | POA: Diagnosis present

## 2020-02-08 DIAGNOSIS — N183 Chronic kidney disease, stage 3 unspecified: Secondary | ICD-10-CM | POA: Diagnosis present

## 2020-02-08 DIAGNOSIS — Z79899 Other long term (current) drug therapy: Secondary | ICD-10-CM

## 2020-02-08 DIAGNOSIS — I517 Cardiomegaly: Secondary | ICD-10-CM | POA: Diagnosis not present

## 2020-02-08 DIAGNOSIS — I10 Essential (primary) hypertension: Secondary | ICD-10-CM | POA: Diagnosis not present

## 2020-02-08 DIAGNOSIS — J9601 Acute respiratory failure with hypoxia: Secondary | ICD-10-CM | POA: Diagnosis not present

## 2020-02-08 DIAGNOSIS — I509 Heart failure, unspecified: Secondary | ICD-10-CM | POA: Diagnosis not present

## 2020-02-08 DIAGNOSIS — R0902 Hypoxemia: Secondary | ICD-10-CM | POA: Diagnosis not present

## 2020-02-08 DIAGNOSIS — E118 Type 2 diabetes mellitus with unspecified complications: Secondary | ICD-10-CM

## 2020-02-08 DIAGNOSIS — J069 Acute upper respiratory infection, unspecified: Secondary | ICD-10-CM

## 2020-02-08 DIAGNOSIS — Z743 Need for continuous supervision: Secondary | ICD-10-CM | POA: Diagnosis not present

## 2020-02-08 DIAGNOSIS — U071 COVID-19: Secondary | ICD-10-CM | POA: Diagnosis not present

## 2020-02-08 DIAGNOSIS — R404 Transient alteration of awareness: Secondary | ICD-10-CM | POA: Diagnosis not present

## 2020-02-08 DIAGNOSIS — R069 Unspecified abnormalities of breathing: Secondary | ICD-10-CM | POA: Diagnosis not present

## 2020-02-08 LAB — URINALYSIS, ROUTINE W REFLEX MICROSCOPIC
Bilirubin Urine: NEGATIVE
Glucose, UA: NEGATIVE mg/dL
Ketones, ur: NEGATIVE mg/dL
Nitrite: NEGATIVE
Protein, ur: >=300 mg/dL — AB
Specific Gravity, Urine: 1.013 (ref 1.005–1.030)
WBC, UA: >50 WBC/hpf — ABNORMAL HIGH (ref 0–5)
pH: 7 (ref 5.0–8.0)

## 2020-02-08 LAB — CBC WITH DIFFERENTIAL/PLATELET
Abs Immature Granulocytes: 0.02 10*3/uL (ref 0.00–0.07)
Basophils Absolute: 0 10*3/uL (ref 0.0–0.1)
Basophils Relative: 0 %
Eosinophils Absolute: 0 10*3/uL (ref 0.0–0.5)
Eosinophils Relative: 0 %
HCT: 38 % (ref 36.0–46.0)
Hemoglobin: 11.7 g/dL — ABNORMAL LOW (ref 12.0–15.0)
Immature Granulocytes: 0 %
Lymphocytes Relative: 28 %
Lymphs Abs: 2.4 10*3/uL (ref 0.7–4.0)
MCH: 28.1 pg (ref 26.0–34.0)
MCHC: 30.8 g/dL (ref 30.0–36.0)
MCV: 91.1 fL (ref 80.0–100.0)
Monocytes Absolute: 0.6 10*3/uL (ref 0.1–1.0)
Monocytes Relative: 7 %
Neutro Abs: 5.4 10*3/uL (ref 1.7–7.7)
Neutrophils Relative %: 65 %
Platelets: 234 10*3/uL (ref 150–400)
RBC: 4.17 MIL/uL (ref 3.87–5.11)
RDW: 17.4 % — ABNORMAL HIGH (ref 11.5–15.5)
WBC: 8.5 10*3/uL (ref 4.0–10.5)
nRBC: 0 % (ref 0.0–0.2)

## 2020-02-08 LAB — COMPREHENSIVE METABOLIC PANEL
ALT: 25 U/L (ref 0–44)
AST: 40 U/L (ref 15–41)
Albumin: 3.4 g/dL — ABNORMAL LOW (ref 3.5–5.0)
Alkaline Phosphatase: 44 U/L (ref 38–126)
Anion gap: 11 (ref 5–15)
BUN: 35 mg/dL — ABNORMAL HIGH (ref 8–23)
CO2: 24 mmol/L (ref 22–32)
Calcium: 8.2 mg/dL — ABNORMAL LOW (ref 8.9–10.3)
Chloride: 104 mmol/L (ref 98–111)
Creatinine, Ser: 2 mg/dL — ABNORMAL HIGH (ref 0.44–1.00)
GFR calc Af Amer: 26 mL/min — ABNORMAL LOW (ref >60)
GFR calc non Af Amer: 23 mL/min — ABNORMAL LOW (ref >60)
Glucose, Bld: 216 mg/dL — ABNORMAL HIGH (ref 70–99)
Potassium: 3.7 mmol/L (ref 3.5–5.1)
Sodium: 139 mmol/L (ref 135–145)
Total Bilirubin: 0.6 mg/dL (ref 0.3–1.2)
Total Protein: 8.2 g/dL — ABNORMAL HIGH (ref 6.5–8.1)

## 2020-02-08 LAB — CBG MONITORING, ED
Glucose-Capillary: 221 mg/dL — ABNORMAL HIGH (ref 70–99)
Glucose-Capillary: 289 mg/dL — ABNORMAL HIGH (ref 70–99)
Glucose-Capillary: 303 mg/dL — ABNORMAL HIGH (ref 70–99)

## 2020-02-08 LAB — D-DIMER, QUANTITATIVE: D-Dimer, Quant: 1.34 ug/mL-FEU — ABNORMAL HIGH (ref 0.00–0.50)

## 2020-02-08 LAB — RESPIRATORY PANEL BY RT PCR (FLU A&B, COVID)
Influenza A by PCR: NEGATIVE
Influenza B by PCR: NEGATIVE
SARS Coronavirus 2 by RT PCR: POSITIVE — AB

## 2020-02-08 LAB — PROCALCITONIN: Procalcitonin: <0.1 ng/mL

## 2020-02-08 LAB — LACTATE DEHYDROGENASE: LDH: 210 U/L — ABNORMAL HIGH (ref 98–192)

## 2020-02-08 LAB — LACTIC ACID, PLASMA: Lactic Acid, Venous: 1.5 mmol/L (ref 0.5–1.9)

## 2020-02-08 LAB — FIBRINOGEN: Fibrinogen: 534 mg/dL — ABNORMAL HIGH (ref 210–475)

## 2020-02-08 LAB — LIPASE, BLOOD: Lipase: 38 U/L (ref 11–51)

## 2020-02-08 LAB — HEMOGLOBIN A1C
Hgb A1c MFr Bld: 8.9 % — ABNORMAL HIGH (ref 4.8–5.6)
Mean Plasma Glucose: 208.73 mg/dL

## 2020-02-08 LAB — C-REACTIVE PROTEIN: CRP: 2.5 mg/dL — ABNORMAL HIGH (ref <1.0)

## 2020-02-08 LAB — TRIGLYCERIDES: Triglycerides: 127 mg/dL (ref <150)

## 2020-02-08 LAB — FERRITIN: Ferritin: 188 ng/mL (ref 11–307)

## 2020-02-08 MED ORDER — INSULIN ASPART 100 UNIT/ML ~~LOC~~ SOLN
0.0000 [IU] | Freq: Three times a day (TID) | SUBCUTANEOUS | Status: DC
  Administered 2020-02-08: 11 [IU] via SUBCUTANEOUS
  Administered 2020-02-09 (×3): 7 [IU] via SUBCUTANEOUS
  Administered 2020-02-10: 4 [IU] via SUBCUTANEOUS
  Administered 2020-02-10: 3 [IU] via SUBCUTANEOUS
  Administered 2020-02-10: 4 [IU] via SUBCUTANEOUS
  Administered 2020-02-11: 7 [IU] via SUBCUTANEOUS
  Administered 2020-02-11: 20 [IU] via SUBCUTANEOUS
  Administered 2020-02-12: 4 [IU] via SUBCUTANEOUS
  Administered 2020-02-12: 7 [IU] via SUBCUTANEOUS
  Filled 2020-02-08: qty 1

## 2020-02-08 MED ORDER — SODIUM CHLORIDE 0.9 % IV BOLUS
500.0000 mL | Freq: Once | INTRAVENOUS | Status: AC
  Administered 2020-02-08: 500 mL via INTRAVENOUS

## 2020-02-08 MED ORDER — INSULIN DETEMIR 100 UNIT/ML ~~LOC~~ SOLN
80.0000 [IU] | Freq: Every day | SUBCUTANEOUS | Status: DC
  Administered 2020-02-08 – 2020-02-11 (×3): 80 [IU] via SUBCUTANEOUS
  Filled 2020-02-08 (×5): qty 0.8

## 2020-02-08 MED ORDER — SIMVASTATIN 20 MG PO TABS
20.0000 mg | ORAL_TABLET | Freq: Every day | ORAL | Status: DC
  Administered 2020-02-08 – 2020-02-11 (×4): 20 mg via ORAL
  Filled 2020-02-08: qty 1
  Filled 2020-02-08: qty 2
  Filled 2020-02-08 (×2): qty 1

## 2020-02-08 MED ORDER — GABAPENTIN 300 MG PO CAPS
300.0000 mg | ORAL_CAPSULE | Freq: Two times a day (BID) | ORAL | Status: DC
  Administered 2020-02-08 – 2020-02-12 (×8): 300 mg via ORAL
  Filled 2020-02-08 (×8): qty 1

## 2020-02-08 MED ORDER — SODIUM CHLORIDE 0.9 % IV SOLN
100.0000 mg | INTRAVENOUS | Status: AC
  Administered 2020-02-08 (×2): 100 mg via INTRAVENOUS
  Filled 2020-02-08: qty 20

## 2020-02-08 MED ORDER — ISOSORBIDE MONONITRATE ER 60 MG PO TB24
60.0000 mg | ORAL_TABLET | Freq: Every day | ORAL | Status: DC
  Administered 2020-02-08 – 2020-02-12 (×5): 60 mg via ORAL
  Filled 2020-02-08 (×6): qty 1

## 2020-02-08 MED ORDER — DEXAMETHASONE SODIUM PHOSPHATE 10 MG/ML IJ SOLN
6.0000 mg | Freq: Once | INTRAMUSCULAR | Status: AC
  Administered 2020-02-08: 6 mg via INTRAVENOUS
  Filled 2020-02-08: qty 1

## 2020-02-08 MED ORDER — HYDRALAZINE HCL 25 MG PO TABS
75.0000 mg | ORAL_TABLET | Freq: Two times a day (BID) | ORAL | Status: DC
  Administered 2020-02-09 – 2020-02-12 (×7): 75 mg via ORAL
  Filled 2020-02-08 (×8): qty 3

## 2020-02-08 MED ORDER — ZINC SULFATE 220 (50 ZN) MG PO CAPS
220.0000 mg | ORAL_CAPSULE | Freq: Every day | ORAL | Status: DC
  Administered 2020-02-09 – 2020-02-12 (×4): 220 mg via ORAL
  Filled 2020-02-08 (×4): qty 1

## 2020-02-08 MED ORDER — ONDANSETRON HCL 4 MG PO TABS: 4.0000 mg | ORAL_TABLET | Freq: Four times a day (QID) | ORAL | Status: DC | PRN

## 2020-02-08 MED ORDER — APIXABAN 2.5 MG PO TABS
2.5000 mg | ORAL_TABLET | Freq: Two times a day (BID) | ORAL | Status: DC
  Administered 2020-02-09 – 2020-02-12 (×7): 2.5 mg via ORAL
  Filled 2020-02-08 (×8): qty 1

## 2020-02-08 MED ORDER — HYDROCOD POLST-CPM POLST ER 10-8 MG/5ML PO SUER: 5.0000 mL | Freq: Two times a day (BID) | ORAL | Status: DC | PRN

## 2020-02-08 MED ORDER — PANTOPRAZOLE SODIUM 40 MG IV SOLR
40.0000 mg | INTRAVENOUS | Status: DC
  Administered 2020-02-08 – 2020-02-11 (×4): 40 mg via INTRAVENOUS
  Filled 2020-02-08 (×4): qty 40

## 2020-02-08 MED ORDER — ONDANSETRON HCL 4 MG/2ML IJ SOLN
4.0000 mg | Freq: Four times a day (QID) | INTRAMUSCULAR | Status: DC | PRN
  Administered 2020-02-11 (×2): 4 mg via INTRAVENOUS
  Filled 2020-02-08 (×2): qty 2

## 2020-02-08 MED ORDER — GUAIFENESIN-DM 100-10 MG/5ML PO SYRP
10.0000 mL | ORAL_SOLUTION | ORAL | Status: DC | PRN
  Administered 2020-02-10 – 2020-02-11 (×2): 10 mL via ORAL
  Filled 2020-02-08 (×2): qty 10

## 2020-02-08 MED ORDER — ALBUTEROL SULFATE HFA 108 (90 BASE) MCG/ACT IN AERS: 2.0000 | INHALATION_SPRAY | Freq: Four times a day (QID) | RESPIRATORY_TRACT | Status: DC | PRN

## 2020-02-08 MED ORDER — ACETAMINOPHEN 325 MG PO TABS
650.0000 mg | ORAL_TABLET | Freq: Four times a day (QID) | ORAL | Status: DC | PRN
  Administered 2020-02-09 – 2020-02-10 (×3): 650 mg via ORAL
  Filled 2020-02-08 (×3): qty 2

## 2020-02-08 MED ORDER — DEXAMETHASONE SODIUM PHOSPHATE 10 MG/ML IJ SOLN
6.0000 mg | INTRAMUSCULAR | Status: DC
  Administered 2020-02-09 – 2020-02-12 (×4): 6 mg via INTRAVENOUS
  Filled 2020-02-08 (×4): qty 1

## 2020-02-08 MED ORDER — ONDANSETRON HCL 4 MG/2ML IJ SOLN
4.0000 mg | Freq: Once | INTRAMUSCULAR | Status: AC
  Administered 2020-02-08: 4 mg via INTRAVENOUS
  Filled 2020-02-08: qty 2

## 2020-02-08 MED ORDER — ASCORBIC ACID 500 MG PO TABS
500.0000 mg | ORAL_TABLET | Freq: Every day | ORAL | Status: DC
  Administered 2020-02-09 – 2020-02-12 (×4): 500 mg via ORAL
  Filled 2020-02-08 (×4): qty 1

## 2020-02-08 MED ORDER — SODIUM CHLORIDE 0.9 % IV SOLN
100.0000 mg | Freq: Every day | INTRAVENOUS | Status: AC
  Administered 2020-02-09 – 2020-02-12 (×4): 100 mg via INTRAVENOUS
  Filled 2020-02-08 (×4): qty 20

## 2020-02-08 MED ORDER — ACETAMINOPHEN 500 MG PO TABS
1000.0000 mg | ORAL_TABLET | Freq: Once | ORAL | Status: AC
  Administered 2020-02-08: 1000 mg via ORAL
  Filled 2020-02-08: qty 2

## 2020-02-08 MED ORDER — INSULIN ASPART 100 UNIT/ML ~~LOC~~ SOLN
0.0000 [IU] | Freq: Every day | SUBCUTANEOUS | Status: DC
  Administered 2020-02-08: 4 [IU] via SUBCUTANEOUS
  Administered 2020-02-09: 2 [IU] via SUBCUTANEOUS
  Administered 2020-02-11: 5 [IU] via SUBCUTANEOUS
  Filled 2020-02-08: qty 1

## 2020-02-08 NOTE — ED Notes (Addendum)
ED TO INPATIENT HANDOFF REPORT  ED Nurse Name and Phone #: Kindel [phone]  S Name/Age/Gender [name] 85 y.o. female Room/Bed: APA07/APA07  Code Status   Code Status: Full Code  Home/SNF/Other Home Patient oriented to: self, place, time and situation Is this baseline? Yes   Triage Complete: Triage complete  Chief Complaint Acute hypoxemic respiratory failure due to COVID-19 ([place]) [U07.1, J96.01]  Triage Note Pt brought in by EMS due weakness x 2 weeks. Vomited x 2 this morning. Denies pain    Allergies No Known Allergies  Level of Care/Admitting Diagnosis ED Disposition    ED Disposition Condition [facility]: [facility] [[id number]]  Level of Care: Telemetry [5]  Covid Evaluation: Confirmed COVID Positive  Diagnosis: Acute hypoxemic respiratory failure due to COVID-19 [facility]) [[id number]]  Admitting Physician: Rodena Goldmann [[id number]]  Attending Physician: Rodena Goldmann [[id number]]  Estimated length of stay: past midnight tomorrow  Certification:: I certify this patient will need inpatient services for at least 2 midnights       B Medical/Surgery History Past Medical History:  Diagnosis Date  . Arthritis   . Chronic diastolic heart failure ([place])   . CKD (chronic kidney disease) stage 3, GFR 30-59 ml/min   . Essential hypertension   . GERD (gastroesophageal reflux disease)   . Hemorrhoids   . History of stroke   . Type 2 diabetes mellitus ([place])    Past Surgical History:  Procedure Laterality Date  . BACK SURGERY    . CATARACT EXTRACTION W/PHACO Left 02/28/2013   Procedure: CATARACT EXTRACTION PHACO AND INTRAOCULAR LENS PLACEMENT (IOL) CDE=15.60;  Surgeon: Elta Guadeloupe T. Gershon Crane, MD;  Location: [facility];  Service: Ophthalmology;  Laterality: Left;  . CHOLECYSTECTOMY    . COLONOSCOPY  12/12/2003   RMR: Normal rectum.  Long capacious tortuous colon, colonic mucosa appeared normal  . COLONOSCOPY N/A 03/21/2014   Dr. [name]: internal and  external hemorrhoids, torturous colon, colonic diverticulosis   . ESOPHAGOGASTRODUODENOSCOPY  12/28/2001   OIB:BCWUG sliding hiatal hernia/. Three small bulbar ulcers, two with stigmata of bleeding and these were coagulated using heater probe unit   . ESOPHAGOGASTRODUODENOSCOPY N/A 03/21/2014   Dr. [name]: cervical esophageal web s/p dilation, negative H.pylori   . EYE SURGERY    . HIP FRACTURE SURGERY  2008  . JOINT REPLACEMENT     Rt hip, ????? sounds like just for fracture  . MALONEY DILATION N/A 03/21/2014   Procedure: [name] DILATION;  Surgeon: [name], MD;  Location: [facility];  Service: Endoscopy;  Laterality: N/A;  . SAVORY DILATION N/A 03/21/2014   Procedure: SAVORY DILATION;  Surgeon: [name], MD;  Location: [facility];  Service: Endoscopy;  Laterality: N/A;     A IV Location/Drains/Wounds Patient Lines/Drains/Airways Status    Active Line/Drains/Airways    Name Placement date Placement time Site Days   Peripheral IV 02/08/20 Left Antecubital 02/08/20  0959  Antecubital  less than 1   Peripheral IV 02/08/20 Anterior;Distal;Right Forearm 02/08/20  1345  Forearm  less than 1   External Urinary Catheter 12/26/19  0846  --  44   External Urinary Catheter 02/08/20  0957  --  less than 1          Intake/Output Last 24 hours No intake or output data in the 24 hours ending 02/08/20 2316  Labs/Imaging Results for orders placed or performed during the hospital encounter of 02/08/20 (from the past 48 hour(s))  D-dimer, quantitative     Status: Abnormal   Collection Time: 02/08/20 10:00 AM  Result Value Ref Range   D-Dimer, Quant 1.34 (H) 0.00 - 0.50 ug/mL-FEU    Comment: (NOTE) At the manufacturer cut-off of 0.50 ug/mL FEU, this assay has been documented to exclude PE with a sensitivity and negative predictive value of 97 to 99%.  At this time, this assay has not been approved by the FDA to exclude DVT/VTE. Results should be correlated with clinical  presentation. Performed at [facility], [address] [place], [place] [address]   Ferritin     Status: None   Collection Time: 02/08/20 10:00 AM  Result Value Ref Range   Ferritin 188 11 - 307 ng/mL    Comment: Performed at [facility], [address], [place], [place] [address]  Triglycerides     Status: None   Collection Time: 02/08/20 10:00 AM  Result Value Ref Range   Triglycerides 127 <150 mg/dL    Comment: Performed at [facility], [address], [place], [place] [address]  Fibrinogen     Status: Abnormal   Collection Time: 02/08/20 10:00 AM  Result Value Ref Range   Fibrinogen 534 (H) 210 - 475 mg/dL    Comment: Performed at [facility], [address], [place], [place] [address]  C-reactive protein     Status: Abnormal   Collection Time: 02/08/20 10:00 AM  Result Value Ref Range   CRP 2.5 (H) <1.0 mg/dL    Comment: Performed at [facility], [address], [place], [place] [address]  CBC with Differential/Platelet     Status: Abnormal   Collection Time: 02/08/20 10:03 AM  Result Value Ref Range   WBC 8.5 4.0 - 10.5 K/uL   RBC 4.17 3.87 - 5.11 MIL/uL   Hemoglobin 11.7 (L) 12.0 - 15.0 g/dL   HCT 38.0 36 - 46 %   MCV 91.1 80.0 - 100.0 fL   MCH 28.1 26.0 - 34.0 pg   MCHC 30.8 30.0 - 36.0 g/dL   RDW 17.4 (H) 11.5 - 15.5 %   Platelets 234 150 - 400 K/uL   nRBC 0.0 0.0 - 0.2 %   Neutrophils Relative % 65 %   Neutro Abs 5.4 1.7 - 7.7 K/uL   Lymphocytes Relative 28 %   Lymphs Abs 2.4 0.7 - 4.0 K/uL   Monocytes Relative 7 %   Monocytes Absolute 0.6 0 - 1 K/uL   Eosinophils Relative 0 %   Eosinophils Absolute 0.0 0 - 0 K/uL   Basophils Relative 0 %   Basophils Absolute 0.0 0 - 0 K/uL   Immature Granulocytes 0 %   Abs Immature Granulocytes 0.02 0.00 - 0.07 K/uL    Comment: Performed at [facility], [address], [place],  [address]  Comprehensive metabolic panel     Status: Abnormal   Collection Time: 02/08/20 10:03 AM  Result Value Ref Range   Sodium 139  135 - 145 mmol/L   Potassium 3.7 3.5 - 5.1 mmol/L   Chloride 104 98 - 111 mmol/L   CO2 24 22 - 32 mmol/L   Glucose, Bld 216 (H) 70 - 99 mg/dL    Comment: Glucose reference range applies only to samples taken after fasting for at least 8 hours.   BUN 35 (H) 8 - 23 mg/dL   Creatinine, Ser 2.00 (H) 0.44 - 1.00 mg/dL   Calcium 8.2 (L) 8.9 - 10.3 mg/dL   Total Protein 8.2 (H) 6.5 -  8.1 g/dL   Albumin 3.4 (L) 3.5 - 5.0 g/dL   AST 40 15 - 41 U/L   ALT 25 0 - 44 U/L   Alkaline Phosphatase 44 38 - 126 U/L   Total Bilirubin 0.6 0.3 - 1.2 mg/dL   GFR calc non Af Amer 23 (L) >60 mL/min   GFR calc Af Amer 26 (L) >60 mL/min   Anion gap 11 5 - 15    Comment: Performed at [facility], [address] [place], [place] [address]  Lipase, blood     Status: None   Collection Time: 02/08/20 10:03 AM  Result Value Ref Range   Lipase 38 11 - 51 U/L    Comment: Performed at [facility], [address] [place], [place] [address]  Hemoglobin A1c     Status: Abnormal   Collection Time: 02/08/20 10:03 AM  Result Value Ref Range   Hgb A1c MFr Bld 8.9 (H) 4.8 - 5.6 %    Comment: (NOTE) Pre diabetes:          5.7%-6.4%  Diabetes:              >6.4%  Glycemic control for   <7.0% adults with diabetes    Mean Plasma Glucose 208.73 mg/dL    Comment: Performed at [facility] [address], [place], [place] [address]  Urinalysis, Routine w reflex microscopic Nasopharyngeal Swab     Status: Abnormal   Collection Time: 02/08/20 10:04 AM  Result Value Ref Range   Color, Urine YELLOW YELLOW   APPearance CLOUDY (A) CLEAR   Specific Gravity, Urine 1.013 1.005 - 1.030   pH 7.0 5.0 - 8.0   Glucose, UA NEGATIVE NEGATIVE mg/dL   Hgb urine dipstick SMALL (A) NEGATIVE   Bilirubin Urine NEGATIVE NEGATIVE   Ketones, ur NEGATIVE NEGATIVE mg/dL   Protein, ur >=300 (A) NEGATIVE mg/dL   Nitrite NEGATIVE NEGATIVE   Leukocytes,Ua LARGE (A) NEGATIVE   RBC / HPF 11-20 0 - 5 RBC/hpf   WBC, UA >50 (H) 0 - 5  WBC/hpf   Bacteria, UA RARE (A) NONE SEEN   Squamous Epithelial / LPF 11-20 0 - 5   Budding Yeast PRESENT    Ca Oxalate Crys, UA PRESENT     Comment: Performed at [facility], [address], [place], [place] [address]  Lactic acid, plasma     Status: None   Collection Time: 02/08/20 10:05 AM  Result Value Ref Range   Lactic Acid, Venous 1.5 0.5 - 1.9 mmol/L    Comment: Performed at [facility], [address] [place], [place] [address]  POC CBG, ED     Status: Abnormal   Collection Time: 02/08/20 10:11 AM  Result Value Ref Range   Glucose-Capillary 221 (H) 70 - 99 mg/dL    Comment: Glucose reference range applies only to samples taken after fasting for at least 8 hours.   Comment 1 Notify RN   Respiratory Panel by RT PCR (Flu A&B, Covid) - Nasopharyngeal Swab     Status: Abnormal   Collection Time: 02/08/20 11:00 AM   Specimen: Nasopharyngeal Swab  Result Value Ref Range   SARS Coronavirus 2 by RT PCR POSITIVE (A) NEGATIVE    Comment: RESULT CALLED TO, READ BACK BY AND VERIFIED WITH: FARMER EBONY @1300  ON 02/08/20 BY TJ (NOTE) SARS-CoV-2 target nucleic acids are DETECTED.  SARS-CoV-2 RNA is generally detectable in upper respiratory specimens  during the acute phase of infection. Positive results are  indicative of the presence of the identified virus, but do not rule out bacterial infection or co-infection with other pathogens not detected by the test. Clinical correlation with patient history and other diagnostic information is necessary to determine patient infection status. The expected result is Negative.  Fact Sheet for Patients:  PinkCheek.be  Fact Sheet for Healthcare Providers: GravelBags.it  This test is not yet approved or cleared by the [place] FDA and  has been authorized for detection and/or diagnosis of SARS-CoV-2 by FDA under an Emergency Use Authorization (EUA).  This EUA will remain in effect  (meaning this test can be  used) for the duration of  the COVID-19 declaration under Section 564(b)(1) of the Act, 21 U.S.C. section 360bbb-3(b)(1), unless the authorization is terminated or revoked sooner.      Influenza A by PCR NEGATIVE NEGATIVE   Influenza B by PCR NEGATIVE NEGATIVE    Comment: (NOTE) The Xpert Xpress SARS-CoV-2/FLU/RSV assay is intended as an aid in  the diagnosis of influenza from Nasopharyngeal swab specimens and  should not be used as a sole basis for treatment. Nasal washings and  aspirates are unacceptable for Xpert Xpress SARS-CoV-2/FLU/RSV  testing.  Fact Sheet for Patients: PinkCheek.be  Fact Sheet for Healthcare Providers: GravelBags.it  This test is not yet approved or cleared by the [place] FDA and  has been authorized for detection and/or diagnosis of SARS-CoV-2 by  FDA under an Emergency Use Authorization (EUA). This EUA will remain  in effect (meaning this test can be used) for the duration of the  Covid-19 declaration under Section 564(b)(1) of the Act, 21  U.S.C. section 360bbb-3(b)(1), unless the authorization is  terminated or revoked. Performed at [facility], [address] [place], [place] [address]   Lactate dehydrogenase     Status: Abnormal   Collection Time: 02/08/20  4:30 PM  Result Value Ref Range   LDH 210 (H) 98 - 192 U/L    Comment: Performed at [facility], [address] [place], [place] [address]  Procalcitonin     Status: None   Collection Time: 02/08/20  4:30 PM  Result Value Ref Range   Procalcitonin <0.10 ng/mL    Comment:        Interpretation: PCT (Procalcitonin) <= 0.5 ng/mL: Systemic infection (sepsis) is not likely. Local bacterial infection is possible. (NOTE)       Sepsis PCT Algorithm           Lower Respiratory Tract                                      Infection PCT Algorithm    ----------------------------      ----------------------------         PCT < 0.25 ng/mL                PCT < 0.10 ng/mL          Strongly encourage             Strongly discourage   discontinuation of antibiotics    initiation of antibiotics    ----------------------------     -----------------------------       PCT 0.25 - 0.50 ng/mL            PCT 0.10 - 0.25 ng/mL               OR       >80%  decrease in PCT            Discourage initiation of                                            antibiotics      Encourage discontinuation           of antibiotics    ----------------------------     -----------------------------         PCT >= 0.50 ng/mL              PCT 0.26 - 0.50 ng/mL               AND        <80% decrease in PCT             Encourage initiation of                                             antibiotics       Encourage continuation           of antibiotics    ----------------------------     -----------------------------        PCT >= 0.50 ng/mL                  PCT > 0.50 ng/mL               AND         increase in PCT                  Strongly encourage                                      initiation of antibiotics    Strongly encourage escalation           of antibiotics                                     -----------------------------                                           PCT <= 0.25 ng/mL                                                 OR                                        > 80% decrease in PCT                                      Discontinue / Do not initiate  antibiotics  Performed at [facility], [address], [place], [place] [address]   CBG monitoring, ED     Status: Abnormal   Collection Time: 02/08/20  5:01 PM  Result Value Ref Range   Glucose-Capillary 289 (H) 70 - 99 mg/dL    Comment: Glucose reference range applies only to samples taken after fasting for at least 8 hours.  CBG monitoring, ED     Status: Abnormal   Collection Time:  02/08/20  9:41 PM  Result Value Ref Range   Glucose-Capillary 303 (H) 70 - 99 mg/dL    Comment: Glucose reference range applies only to samples taken after fasting for at least 8 hours.   DG Chest Portable 1 View  Result Date: 02/08/2020 CLINICAL DATA:  Fever and cough for 2 weeks.  COVID status pending. EXAM: PORTABLE CHEST 1 VIEW COMPARISON:  January 01, 2020 FINDINGS: The mediastinal contour and cardiac silhouette are stable. The heart size is enlarged. Increased pulmonary interstitium is identified bilaterally unchanged. No focal pneumonia or pleural effusion is identified. IMPRESSION: Chronic congestive heart failure.  No focal pneumonia. Electronically Signed   By: Abelardo Diesel M.D.   On: 02/08/2020 10:32    Pending Labs Unresulted Labs (From admission, onward)          Start     Ordered   02/09/20 0500  CBC with Differential/Platelet  Daily,   R      02/08/20 1447   02/09/20 0500  Comprehensive metabolic panel  Daily,   R      02/08/20 1447   02/09/20 0500  C-reactive protein  Daily,   R      02/08/20 1447   02/09/20 0500  D-dimer, quantitative (not at [facility])  Daily,   R      02/08/20 1447   02/09/20 0500  Ferritin  Daily,   R      02/08/20 1447   02/09/20 0500  Magnesium  Daily,   R      02/08/20 1447   02/08/20 1146  Urine culture  Add-on,   AD        02/08/20 1149   02/08/20 1004  Blood culture (routine x 2)  BLOOD CULTURE X 2,   STAT      02/08/20 1006          Vitals/Pain Today's Vitals   02/08/20 1530 02/08/20 1600 02/08/20 1800 02/08/20 2030  BP: (!) 155/66 (!) 161/69 (!) 164/66   Pulse: 71 69 69   Resp: (!) 23 16 (!) 22   Temp:      TempSrc:      SpO2: 100% 100% 100%   Weight:      Height:      PainSc:    0-No pain    Isolation Precautions Airborne and Contact precautions  Medications Medications  remdesivir 100 mg in sodium chloride 0.9 % 100 mL IVPB (has no administration in time range)  hydrALAZINE (APRESOLINE) tablet 75 mg (75 mg Oral Not  Given 02/08/20 1521)  isosorbide mononitrate (IMDUR) 24 hr tablet 60 mg (60 mg Oral Given 02/08/20 1705)  simvastatin (ZOCOR) tablet 20 mg (20 mg Oral Given 02/08/20 1710)  insulin detemir (LEVEMIR) injection 80 Units (80 Units Subcutaneous Given 02/08/20 2154)  pantoprazole (PROTONIX) injection 40 mg (40 mg Intravenous Given 02/08/20 1514)  apixaban (ELIQUIS) tablet 2.5 mg (2.5 mg Oral Not Given 02/08/20 1522)  gabapentin (NEURONTIN) capsule 300 mg (300 mg Oral Given 02/08/20 2155)  insulin aspart (novoLOG) injection 0-20  Units (11 Units Subcutaneous Given 02/08/20 1705)  insulin aspart (novoLOG) injection 0-5 Units (4 Units Subcutaneous Given 02/08/20 2156)  albuterol (VENTOLIN HFA) 108 (90 Base) MCG/ACT inhaler 2 puff (has no administration in time range)  dexamethasone (DECADRON) injection 6 mg (has no administration in time range)  guaiFENesin-dextromethorphan (ROBITUSSIN DM) 100-10 MG/5ML syrup 10 mL (has no administration in time range)  chlorpheniramine-HYDROcodone (TUSSIONEX) 10-8 MG/5ML suspension 5 mL (has no administration in time range)  ascorbic acid (VITAMIN C) tablet 500 mg (500 mg Oral Not Given 02/08/20 1522)  zinc sulfate capsule 220 mg (220 mg Oral Not Given 02/08/20 1522)  acetaminophen (TYLENOL) tablet 650 mg (has no administration in time range)  ondansetron (ZOFRAN) tablet 4 mg (has no administration in time range)    Or  ondansetron (ZOFRAN) injection 4 mg (has no administration in time range)  acetaminophen (TYLENOL) tablet 1,000 mg (1,000 mg Oral Given 02/08/20 1048)  sodium chloride 0.9 % bolus 500 mL (0 mLs Intravenous Stopped 02/08/20 1334)  ondansetron (ZOFRAN) injection 4 mg (4 mg Intravenous Given 02/08/20 1049)  dexamethasone (DECADRON) injection 6 mg (6 mg Intravenous Given 02/08/20 1352)  remdesivir 100 mg in sodium chloride 0.9 % 100 mL IVPB (0 mg Intravenous Stopped 02/08/20 1656)    Mobility  High fall risk   Focused Assessments            R Recommendations: See Admitting Provider Note   Report given to:   Additional Notes: Admit r/t new O2 requirements (2L)

## 2020-02-08 NOTE — H&P (Addendum)
History and Physical    [name] TDS:[id number] DOB: January 04, 1935 DOA: 02/08/2020  PCP: [name], MD   Patient coming from: Home  Chief Complaint: Weakness  HPI: [name] is a 85 y.o. female with medical history significant for hypertension, type 2 diabetes, CKD stage IIIb, diastolic congestive heart failure grade 2, paroxysmal atrial fibrillation on Eliquis with chronic LBBB, and GERD who presented to the emergency department with worsening weakness over the last 2 weeks and general unwellness.  She has developed a mild productive cough and has had some episodes of nausea and vomiting x2 earlier this morning.  She continues to remain somewhat nauseated, but denies any abdominal pain or diarrhea.  She denies any fevers or chills at home.  She also denies any significant chest pain, shortness of breath, dysuria, or any other symptomatic concerns.  She denies any exposure to sick contacts and states that she has not been vaccinated for Covid.  She did take Zofran earlier this morning with no improvement in symptoms.   ED Course: Vital signs are stable, but patient is mildly febrile with temperature 100.7 F.  BUN is 35 and creatinine is 2 which is relatively consistent with her CKD stage IIIb.  Blood glucose is 216 and lactic acid is 1.5.  Chest x-ray with chronic CHF findings and no pneumonia noted.  She is positive for Covid and is currently requiring 1.5 L nasal cannula oxygen.  She is not currently with any dyspnea or tachypnea.  She has received some Decadron in the ED.  Review of Systems: All others reviewed and otherwise negative.  Past Medical History:  Diagnosis Date  . Arthritis   . Chronic diastolic heart failure ([place])   . CKD (chronic kidney disease) stage 3, GFR 30-59 ml/min   . Essential hypertension   . GERD (gastroesophageal reflux disease)   . Hemorrhoids   . History of stroke   . Type 2 diabetes mellitus ([place])     Past Surgical History:  Procedure Laterality  Date  . BACK SURGERY    . CATARACT EXTRACTION W/PHACO Left 02/28/2013   Procedure: CATARACT EXTRACTION PHACO AND INTRAOCULAR LENS PLACEMENT (IOL) CDE=15.60;  Surgeon: Elta Guadeloupe T. Gershon Crane, MD;  Location: [facility];  Service: Ophthalmology;  Laterality: Left;  . CHOLECYSTECTOMY    . COLONOSCOPY  12/12/2003   RMR: Normal rectum.  Long capacious tortuous colon, colonic mucosa appeared normal  . COLONOSCOPY N/A 03/21/2014   Dr. [name]: internal and external hemorrhoids, torturous colon, colonic diverticulosis   . ESOPHAGOGASTRODUODENOSCOPY  12/28/2001   WIO:MBTDH sliding hiatal hernia/. Three small bulbar ulcers, two with stigmata of bleeding and these were coagulated using heater probe unit   . ESOPHAGOGASTRODUODENOSCOPY N/A 03/21/2014   Dr. [name]: cervical esophageal web s/p dilation, negative H.pylori   . EYE SURGERY    . HIP FRACTURE SURGERY  2008  . JOINT REPLACEMENT     Rt hip, ????? sounds like just for fracture  . MALONEY DILATION N/A 03/21/2014   Procedure: [name] DILATION;  Surgeon: [name], MD;  Location: [facility];  Service: Endoscopy;  Laterality: N/A;  . SAVORY DILATION N/A 03/21/2014   Procedure: SAVORY DILATION;  Surgeon: [name], MD;  Location: [facility];  Service: Endoscopy;  Laterality: N/A;     reports that she has never smoked. She has never used smokeless tobacco. She reports that she does not drink alcohol and does not use drugs.  No Known Allergies  Family History  Problem Relation  Age of Onset  . Crohn's disease Daughter   . Hypertension Mother   . Hypertension Sister   . Diabetes Brother   . Diabetes Sister   . Diabetes Sister   . Heart attack Sister   . Colon cancer Neg Hx     Prior to Admission medications   Medication Sig Start Date End Date Taking? Authorizing Provider  apixaban (ELIQUIS) 2.5 MG TABS tablet Take 1 tablet (2.5 mg total) by mouth 2 (two) times daily. 07/31/19  Yes Emokpae, Courage, MD  famotidine (PEPCID) 20 MG tablet Take 1  tablet (20 mg total) by mouth daily. 01/04/20  Yes Barton Dubois, MD  gabapentin (NEURONTIN) 300 MG capsule Take 300 mg by mouth 2 (two) times daily.  02/22/14  Yes [provider]  hydrALAZINE (APRESOLINE) 50 MG tablet Take 1.5 tablets (75 mg total) by mouth 2 (two) times daily. 06/23/19 10/08/20 Yes Herminio Commons, MD  insulin aspart (NOVOLOG) 100 UNIT/ML FlexPen Inject 0-10 Units into the skin 3 (three) times daily with meals. insulin aspart (novoLOG) injection 0-10 Units 0-10 Units Subcutaneous, 3 times daily with meals CBG < 70: Implement Hypoglycemia Standing Orders and refer to Hypoglycemia Standing Orders sidebar report  CBG 70 - 120: 0 unit CBG 121 - 150: 0 unit  CBG 151 - 200: 1 unit CBG 201 - 250: 2 units CBG 251 - 300: 4 units CBG 301 - 350: 6 units  CBG 351 - 400: 8 units  CBG > 400: 10 units 07/31/19  Yes Emokpae, Courage, MD  insulin detemir (LEVEMIR) 100 UNIT/ML injection Inject 0.7 mLs (70 Units total) into the skin at bedtime. Patient taking differently: Inject 80 Units into the skin at bedtime.  07/31/19  Yes Roxan Hockey, MD  isosorbide mononitrate (IMDUR) 60 MG 24 hr tablet Take 0.5 tablets (30 mg total) by mouth daily. Patient taking differently: Take 60 mg by mouth daily.  01/03/20 01/02/21 Yes Barton Dubois, MD  ondansetron (ZOFRAN) 4 MG tablet Take 4 mg by mouth every 6 (six) hours as needed for nausea or vomiting.   Yes [provider]  simvastatin (ZOCOR) 20 MG tablet Take 20 mg by mouth at bedtime.   Yes [provider]  torsemide (DEMADEX) 20 MG tablet Take 1 tablet (20 mg total) by mouth 2 (two) times daily. 07/31/19  Yes Roxan Hockey, MD  acetaminophen (TYLENOL) 325 MG tablet Take 2 tablets (650 mg total) by mouth every 6 (six) hours as needed for mild pain, fever or headache (or Fever >/= 101). 07/31/19   Roxan Hockey, MD  hydrocortisone (ANUSOL-HC) 2.5 % rectal cream Place 1 application rectally 2 (two) times daily as needed for  hemorrhoids or anal itching. 01/03/20   Barton Dubois, MD    Physical Exam: Vitals:   02/08/20 0941 02/08/20 1030 02/08/20 1100 02/08/20 1200  BP: (!) 191/55  (!) 162/83 (!) 173/64  Pulse: 84 71 75 74  Resp: 20 (!) 21 20 19   Temp: (!) 100.7 F (38.2 C)     TempSrc: Oral     SpO2: (!) 89% 98% 99% 98%  Weight:      Height:        Constitutional: NAD, calm, comfortable Vitals:   02/08/20 0941 02/08/20 1030 02/08/20 1100 02/08/20 1200  BP: (!) 191/55  (!) 162/83 (!) 173/64  Pulse: 84 71 75 74  Resp: 20 (!) 21 20 19   Temp: (!) 100.7 F (38.2 C)     TempSrc: Oral     SpO2: Marland Kitchen)  89% 98% 99% 98%  Weight:      Height:       Eyes: lids and conjunctivae normal ENMT: Mucous membranes are moist.  Neck: normal, supple Respiratory: clear to auscultation bilaterally. Normal respiratory effort. No accessory muscle use. On 1.5L [place]. Cardiovascular: Regular rate and rhythm, no murmurs. No extremity edema. Abdomen: no tenderness, no distention. Bowel sounds positive.  Musculoskeletal:  No joint deformity upper and lower extremities.   Skin: no rashes, lesions, ulcers.  Psychiatric: Flat affect.  Labs on Admission: I have personally reviewed following labs and imaging studies  CBC: Recent Labs  Lab 02/08/20 1003  WBC 8.5  NEUTROABS 5.4  HGB 11.7*  HCT 38.0  MCV 91.1  PLT 093   Basic Metabolic Panel: Recent Labs  Lab 02/08/20 1003  NA 139  K 3.7  CL 104  CO2 24  GLUCOSE 216*  BUN 35*  CREATININE 2.00*  CALCIUM 8.2*   GFR: Estimated Creatinine Clearance: 26.3 mL/min (A) (by C-G formula based on SCr of 2 mg/dL (H)). Liver Function Tests: Recent Labs  Lab 02/08/20 1003  AST 40  ALT 25  ALKPHOS 44  BILITOT 0.6  PROT 8.2*  ALBUMIN 3.4*   Recent Labs  Lab 02/08/20 1003  LIPASE 38   No results for input(s): AMMONIA in the last 168 hours. Coagulation Profile: No results for input(s): INR, PROTIME in the last 168 hours. Cardiac Enzymes: No results for input(s):  CKTOTAL, CKMB, CKMBINDEX, TROPONINI in the last 168 hours. BNP (last 3 results) No results for input(s): PROBNP in the last 8760 hours. HbA1C: No results for input(s): HGBA1C in the last 72 hours. CBG: Recent Labs  Lab 02/08/20 1011  GLUCAP 221*   Lipid Profile: No results for input(s): CHOL, HDL, LDLCALC, TRIG, CHOLHDL, LDLDIRECT in the last 72 hours. Thyroid Function Tests: No results for input(s): TSH, T4TOTAL, FREET4, T3FREE, THYROIDAB in the last 72 hours. Anemia Panel: No results for input(s): VITAMINB12, FOLATE, FERRITIN, TIBC, IRON, RETICCTPCT in the last 72 hours. Urine analysis:    Component Value Date/Time   COLORURINE YELLOW 02/08/2020 1004   APPEARANCEUR CLOUDY (A) 02/08/2020 1004   LABSPEC 1.013 02/08/2020 1004   PHURINE 7.0 02/08/2020 1004   GLUCOSEU NEGATIVE 02/08/2020 1004   HGBUR SMALL (A) 02/08/2020 1004   BILIRUBINUR NEGATIVE 02/08/2020 1004   KETONESUR NEGATIVE 02/08/2020 1004   PROTEINUR >=300 (A) 02/08/2020 1004   UROBILINOGEN 0.2 02/20/2015 1200   NITRITE NEGATIVE 02/08/2020 1004   LEUKOCYTESUR LARGE (A) 02/08/2020 1004    Radiological Exams on Admission: DG Chest Portable 1 View  Result Date: 02/08/2020 CLINICAL DATA:  Fever and cough for 2 weeks.  COVID status pending. EXAM: PORTABLE CHEST 1 VIEW COMPARISON:  January 01, 2020 FINDINGS: The mediastinal contour and cardiac silhouette are stable. The heart size is enlarged. Increased pulmonary interstitium is identified bilaterally unchanged. No focal pneumonia or pleural effusion is identified. IMPRESSION: Chronic congestive heart failure.  No focal pneumonia. Electronically Signed   By: Abelardo Diesel M.D.   On: 02/08/2020 10:32    EKG: Independently reviewed. SR 84bpm, chronic LBBB.  Assessment/Plan Active Problems:   Acute hypoxemic respiratory failure due to COVID-19 [facility])    Acute hypoxemic respiratory failure secondary to COVID-19 pneumonia -Patient is unvaccinated -Start  remdesivir -Continue on Decadron -Monitor inflammatory markers -Check procalcitonin -Breathing treatments, antitussives, incentive spirometry -Vitamin C, zinc -Isolation precautions -Wean oxygen as tolerated  Asymptomatic pyuria -Urine culture sent from ED -No current indication for treatment  History of  diastolic CHF -Prior 2D echocardiogram 06/2019 with LVEF 60-65% and grade 2 diastolic dysfunction noted -No current decompensation -Holding home torsemide for now, please resume if p.o. intake stabilizes -Follow daily weights  History of CKD stage IIIb -Baseline creatinine anywhere from 1.3-1.8 -Creatinine mildly elevated at 2, hold torsemide for now -No need for IV fluid currently noted -Monitor repeat labs in a.m.  History of hypertension -Continue home Imdur and hydralazine -Plan to hold torsemide for now until p.o. intake stabilizes  History of paroxysmal atrial fibrillation with chronic LBBB -Continue anticoagulation with Eliquis -Does not appear to be on any rate control medications although she was noted to be on Cardizem previously -Currently in sinus rhythm on EKG  History of type 2 diabetes with polyneuropathy -With noted hyperglycemia -Continue home Levemir and SSI -Carb modified diet -Watch for steroid-induced hyperglycemia -Continue statin with no significant LFT elevation noted -Continue gabapentin for neuropathy  History of recent acute pancreatitis -Recent discharge on 01/03/2020  History of obesity -Lifestyle changes -Need for outpatient sleep study in the near future for concern for OSA  History of GERD -Maintain on PPI while inpatient  DVT prophylaxis:Eliquis Code Status: Full Family Communication: None at bedside, patient states she will call Disposition Plan:Admit for COVID PNA treatment Consults called:None Admission status: Inpatient, Tele   Shatha Hooser D Anginette Espejo DO [facility]  If 7PM-7AM, please contact  night-coverage [email or web address]  02/08/2020, 2:10 PM

## 2020-02-08 NOTE — ED Provider Notes (Signed)
[facility] Provider Note   CSN: [id number] Arrival date & time: 02/08/20  9476     History Chief Complaint  Patient presents with  . Weakness    [name] is a 85 y.o. female with a history significant for CHF, chronic kidney disease stage III, hypertension, GERD, type 2 diabetes and history of CVA, was discharged from here 1 month ago for treatment of acute pancreatitis, states she has felt unwell for the past 2 weeks, endorsing generalized worsening weakness without any specific other complaints, except over the past several days has developed a productive cough and had new onset vomiting x2 early this morning.  She continues to be very nauseated, denies abdominal pain.  She presents with a fever of 100.7 this morning, she was unaware of having a fever at home.  She denies shortness of breath, chest or back pain.  She also denies dysuria, no diarrhea, last bowel movement was 2 days ago.  She has had no exposures to COVID-19 and is unvaccinated.  She took a Zofran tablet without improvement in her symptoms.  The history is provided by the patient.       Past Medical History:  Diagnosis Date  . Arthritis   . Chronic diastolic heart failure ([place])   . CKD (chronic kidney disease) stage 3, GFR 30-59 ml/min   . Essential hypertension   . GERD (gastroesophageal reflux disease)   . Hemorrhoids   . History of stroke   . Type 2 diabetes mellitus [facility])     Patient Active Problem List   Diagnosis Date Noted  . Educated about COVID-19 virus infection 01/11/2020  . Acute urinary retention   . Acute pulmonary edema (HCC)   . AKI (acute kidney injury) ([place]) 07/25/2019  . Acute respiratory failure with hypercapnia ([place]) 07/24/2019  . Pancreatitis, acute 07/23/2019  . Diabetes mellitus type 2 with complications ([place]) 54/65/0354  . Atrial fibrillation, chronic ([place]) 07/23/2019  . Chronic diastolic CHF (congestive heart failure) (HCC)/EF > 60 % 07/23/2019  .  Shortness of breath 06/19/2019  . Acute diastolic CHF (congestive heart failure) ([place]) 07/08/2017  . Acute respiratory failure with hypoxemia ([place]) 07/08/2017  . Uncontrolled type 2 diabetes mellitus with diabetic nephropathy, with long-term current use of insulin ([place]) 07/08/2017  . CKD (chronic kidney disease), stage IIIb 07/08/2017  . Essential hypertension 07/08/2017  . Hypertension 06/23/2017  . Prolapsed internal hemorrhoids, grade 3 06/25/2016  . Abdominal pain 05/08/2015  . Dysphagia, pharyngoesophageal phase   . Other hemorrhoids   . Diverticulosis of colon without hemorrhage   . GERD (gastroesophageal reflux disease) 02/28/2014  . Constipation 02/28/2014  . Esophageal dysphagia 02/28/2014  . Rectal bleeding 02/28/2014  . OSTEOARTHRITIS, KNEE, RIGHT, SEVERE 12/13/2008  . DEGENERATIVE DISC DISEASE, LUMBOSACRAL SPINE W/RADICULOPATHY 12/13/2008  . BACK PAIN 12/13/2008  . DIABETES 04/13/2007  . FRACTURE, FEMUR, INTERTROCHANTERIC REGION 04/13/2007    Past Surgical History:  Procedure Laterality Date  . BACK SURGERY    . CATARACT EXTRACTION W/PHACO Left 02/28/2013   Procedure: CATARACT EXTRACTION PHACO AND INTRAOCULAR LENS PLACEMENT (IOL) CDE=15.60;  Surgeon: Elta Guadeloupe T. Gershon Crane, MD;  Location: [facility];  Service: Ophthalmology;  Laterality: Left;  . CHOLECYSTECTOMY    . COLONOSCOPY  12/12/2003   RMR: Normal rectum.  Long capacious tortuous colon, colonic mucosa appeared normal  . COLONOSCOPY N/A 03/21/2014   Dr. [name]: internal and external hemorrhoids, torturous colon, colonic diverticulosis   . ESOPHAGOGASTRODUODENOSCOPY  12/28/2001   SFK:CLEXN sliding hiatal hernia/. Three small  bulbar ulcers, two with stigmata of bleeding and these were coagulated using heater probe unit   . ESOPHAGOGASTRODUODENOSCOPY N/A 03/21/2014   Dr. [name]: cervical esophageal web s/p dilation, negative H.pylori   . EYE SURGERY    . HIP FRACTURE SURGERY  2008  . JOINT REPLACEMENT     Rt hip, ????? sounds  like just for fracture  . MALONEY DILATION N/A 03/21/2014   Procedure: [name] DILATION;  Surgeon: [name], MD;  Location[facility];  Service: Endoscopy;  Laterality: N/A;  . SAVORY DILATION N/A 03/21/2014   Procedure: SAVORY DILATION;  Surgeon: [name], MD;  Location: [facility];  Service: Endoscopy;  Laterality: N/A;     OB History   No obstetric history on file.     Family History  Problem Relation Age of Onset  . Crohn's disease Daughter   . Hypertension Mother   . Hypertension Sister   . Diabetes Brother   . Diabetes Sister   . Diabetes Sister   . Heart attack Sister   . Colon cancer Neg Hx     Social History   Tobacco Use  . Smoking status: Never Smoker  . Smokeless tobacco: Never Used  Vaping Use  . Vaping Use: Never used  Substance Use Topics  . Alcohol use: No  . Drug use: No    Home Medications Prior to Admission medications   Medication Sig Start Date End Date Taking? Authorizing Provider  apixaban (ELIQUIS) 2.5 MG TABS tablet Take 1 tablet (2.5 mg total) by mouth 2 (two) times daily. 07/31/19  Yes Emokpae, Courage, MD  famotidine (PEPCID) 20 MG tablet Take 1 tablet (20 mg total) by mouth daily. 01/04/20  Yes Barton Dubois, MD  gabapentin (NEURONTIN) 300 MG capsule Take 300 mg by mouth 2 (two) times daily.  02/22/14  Yes [provider]  hydrALAZINE (APRESOLINE) 50 MG tablet Take 1.5 tablets (75 mg total) by mouth 2 (two) times daily. 06/23/19 10/08/20 Yes Herminio Commons, MD  insulin aspart (NOVOLOG) 100 UNIT/ML FlexPen Inject 0-10 Units into the skin 3 (three) times daily with meals. insulin aspart (novoLOG) injection 0-10 Units 0-10 Units Subcutaneous, 3 times daily with meals CBG < 70: Implement Hypoglycemia Standing Orders and refer to Hypoglycemia Standing Orders sidebar report  CBG 70 - 120: 0 unit CBG 121 - 150: 0 unit  CBG 151 - 200: 1 unit CBG 201 - 250: 2 units CBG 251 - 300: 4 units CBG 301 - 350: 6 units  CBG 351 - 400: 8  units  CBG > 400: 10 units 07/31/19  Yes Emokpae, Courage, MD  insulin detemir (LEVEMIR) 100 UNIT/ML injection Inject 0.7 mLs (70 Units total) into the skin at bedtime. Patient taking differently: Inject 80 Units into the skin at bedtime.  07/31/19  Yes Roxan Hockey, MD  isosorbide mononitrate (IMDUR) 60 MG 24 hr tablet Take 0.5 tablets (30 mg total) by mouth daily. Patient taking differently: Take 60 mg by mouth daily.  01/03/20 01/02/21 Yes Barton Dubois, MD  ondansetron (ZOFRAN) 4 MG tablet Take 4 mg by mouth every 6 (six) hours as needed for nausea or vomiting.   Yes [provider]  simvastatin (ZOCOR) 20 MG tablet Take 20 mg by mouth at bedtime.   Yes [provider]  torsemide (DEMADEX) 20 MG tablet Take 1 tablet (20 mg total) by mouth 2 (two) times daily. 07/31/19  Yes Emokpae, Courage, MD  acetaminophen (TYLENOL) 325 MG tablet Take 2 tablets (  650 mg total) by mouth every 6 (six) hours as needed for mild pain, fever or headache (or Fever >/= 101). 07/31/19   Roxan Hockey, MD  hydrocortisone (ANUSOL-HC) 2.5 % rectal cream Place 1 application rectally 2 (two) times daily as needed for hemorrhoids or anal itching. 01/03/20   Barton Dubois, MD    Allergies    Patient has no known allergies.  Review of Systems   Review of Systems  Constitutional: Positive for appetite change, fatigue and fever. Negative for chills.  HENT: Negative for congestion, ear pain, rhinorrhea, sinus pressure, sore throat, trouble swallowing and voice change.   Eyes: Negative for discharge.  Respiratory: Positive for cough. Negative for shortness of breath, wheezing and stridor.   Cardiovascular: Negative for chest pain.  Gastrointestinal: Positive for nausea and vomiting. Negative for abdominal pain, constipation and diarrhea.  Genitourinary: Negative.  Negative for dysuria.  Neurological: Positive for weakness.    Physical Exam Updated Vital Signs BP (!) 173/64   Pulse 74   Temp (!)  100.7 F (38.2 C) (Oral)   Resp 19   Ht 5\' 8"  (1.727 m)   Wt 99.8 kg   SpO2 98%   BMI 33.45 kg/m   Physical Exam Vitals and nursing note reviewed.  Constitutional:      Appearance: She is well-developed.  HENT:     Head: Normocephalic and atraumatic.  Eyes:     Conjunctiva/sclera: Conjunctivae normal.  Cardiovascular:     Rate and Rhythm: Normal rate and regular rhythm.     Heart sounds: Normal heart sounds.  Pulmonary:     Effort: Respiratory distress present.     Breath sounds: Normal breath sounds. No stridor. No wheezing, rhonchi or rales.  Abdominal:     General: Bowel sounds are normal.     Palpations: Abdomen is soft.     Tenderness: There is no abdominal tenderness.  Musculoskeletal:        General: Normal range of motion.     Cervical back: Normal range of motion.  Skin:    General: Skin is warm and dry.  Neurological:     Mental Status: She is alert.     ED Results / Procedures / Treatments   Labs (all labs ordered are listed, but only abnormal results are displayed) Labs Reviewed  RESPIRATORY PANEL BY RT PCR (FLU A&B, COVID) - Abnormal; Notable for the following components:      Result Value   SARS Coronavirus 2 by RT PCR POSITIVE (*)    All other components within normal limits  CBC WITH DIFFERENTIAL/PLATELET - Abnormal; Notable for the following components:   Hemoglobin 11.7 (*)    RDW 17.4 (*)    All other components within normal limits  COMPREHENSIVE METABOLIC PANEL - Abnormal; Notable for the following components:   Glucose, Bld 216 (*)    BUN 35 (*)    Creatinine, Ser 2.00 (*)    Calcium 8.2 (*)    Total Protein 8.2 (*)    Albumin 3.4 (*)    GFR calc non Af Amer 23 (*)    GFR calc Af Amer 26 (*)    All other components within normal limits  URINALYSIS, ROUTINE W REFLEX MICROSCOPIC - Abnormal; Notable for the following components:   APPearance CLOUDY (*)    Hgb urine dipstick SMALL (*)    Protein, ur >=300 (*)    Leukocytes,Ua LARGE (*)     WBC, UA >50 (*)    Bacteria, UA RARE (*)  All other components within normal limits  CBG MONITORING, ED - Abnormal; Notable for the following components:   Glucose-Capillary 221 (*)    All other components within normal limits  CULTURE, BLOOD (ROUTINE X 2)  CULTURE, BLOOD (ROUTINE X 2)  URINE CULTURE  LIPASE, BLOOD  LACTIC ACID, PLASMA  D-DIMER, QUANTITATIVE (NOT AT [facility])  PROCALCITONIN  LACTATE DEHYDROGENASE  FERRITIN  TRIGLYCERIDES  FIBRINOGEN  C-REACTIVE PROTEIN    EKG EKG Interpretation  Date/Time:  Thursday February 08 2020 09:46:31 EDT Ventricular Rate:  84 PR Interval:    QRS Duration: 134 QT Interval:  433 QTC Calculation: 512 R Axis:   -54 Text Interpretation: Sinus rhythm Atrial premature complexes Left bundle branch block No significant change since last tracing Confirmed by Dorie Rank [phone]) on 02/08/2020 10:01:38 AM   Radiology DG Chest Portable 1 View  Result Date: 02/08/2020 CLINICAL DATA:  Fever and cough for 2 weeks.  COVID status pending. EXAM: PORTABLE CHEST 1 VIEW COMPARISON:  January 01, 2020 FINDINGS: The mediastinal contour and cardiac silhouette are stable. The heart size is enlarged. Increased pulmonary interstitium is identified bilaterally unchanged. No focal pneumonia or pleural effusion is identified. IMPRESSION: Chronic congestive heart failure.  No focal pneumonia. Electronically Signed   By: Abelardo Diesel M.D.   On: 02/08/2020 10:32    Procedures Procedures (including critical care time)  Medications Ordered in ED Medications  dexamethasone (DECADRON) injection 6 mg (has no administration in time range)  acetaminophen (TYLENOL) tablet 1,000 mg (1,000 mg Oral Given 02/08/20 1048)  sodium chloride 0.9 % bolus 500 mL (0 mLs Intravenous Stopped 02/08/20 1334)  ondansetron (ZOFRAN) injection 4 mg (4 mg Intravenous Given 02/08/20 1049)    ED Course  I have reviewed the triage vital signs and the nursing notes.  Pertinent labs & imaging  results that were available during my care of the patient were reviewed by me and considered in my medical decision making (see chart for details).    MDM Rules/Calculators/A&P                          Pt with generalized weakness, cough, sob presenting with o2 saturation 89%, low grade fever, Covid 19 positive.  Also with UA suggesting yeast infection, pt denies dysuria or vaginal complaints. This was not a clean catch - urine cx pending.    Pt with new o2 requirement, recommended admission for further tx/supportive care.  Decadron ordered, 1st dose of remdesivir per pharmacy consult.   Discussed with Dr. Manuella Ghazi who accepts pt for admission.  Final Clinical Impression(s) / ED Diagnoses Final diagnoses:  [id number]    Rx / DC Orders ED Discharge Orders    None       Landis Martins 02/08/20 1339    Dorie Rank, MD 02/09/20 [phone]

## 2020-02-08 NOTE — ED Triage Notes (Signed)
Pt brought in by EMS due weakness x 2 weeks. Vomited x 2 this morning. Denies pain

## 2020-02-09 LAB — CBC WITH DIFFERENTIAL/PLATELET
Abs Immature Granulocytes: 0.02 10*3/uL (ref 0.00–0.07)
Basophils Absolute: 0 10*3/uL (ref 0.0–0.1)
Basophils Relative: 0 %
Eosinophils Absolute: 0 10*3/uL (ref 0.0–0.5)
Eosinophils Relative: 0 %
HCT: 33.9 % — ABNORMAL LOW (ref 36.0–46.0)
Hemoglobin: 10.5 g/dL — ABNORMAL LOW (ref 12.0–15.0)
Immature Granulocytes: 0 %
Lymphocytes Relative: 25 %
Lymphs Abs: 1.4 10*3/uL (ref 0.7–4.0)
MCH: 27.9 pg (ref 26.0–34.0)
MCHC: 31 g/dL (ref 30.0–36.0)
MCV: 89.9 fL (ref 80.0–100.0)
Monocytes Absolute: 0.5 10*3/uL (ref 0.1–1.0)
Monocytes Relative: 8 %
Neutro Abs: 3.8 10*3/uL (ref 1.7–7.7)
Neutrophils Relative %: 67 %
Platelets: 180 10*3/uL (ref 150–400)
RBC: 3.77 MIL/uL — ABNORMAL LOW (ref 3.87–5.11)
RDW: 16.6 % — ABNORMAL HIGH (ref 11.5–15.5)
WBC: 5.7 10*3/uL (ref 4.0–10.5)
nRBC: 0 % (ref 0.0–0.2)

## 2020-02-09 LAB — COMPREHENSIVE METABOLIC PANEL
ALT: 29 U/L (ref 0–44)
AST: 41 U/L (ref 15–41)
Albumin: 2.8 g/dL — ABNORMAL LOW (ref 3.5–5.0)
Alkaline Phosphatase: 39 U/L (ref 38–126)
Anion gap: 9 (ref 5–15)
BUN: 40 mg/dL — ABNORMAL HIGH (ref 8–23)
CO2: 25 mmol/L (ref 22–32)
Calcium: 8 mg/dL — ABNORMAL LOW (ref 8.9–10.3)
Chloride: 104 mmol/L (ref 98–111)
Creatinine, Ser: 1.71 mg/dL — ABNORMAL HIGH (ref 0.44–1.00)
GFR calc Af Amer: 32 mL/min — ABNORMAL LOW (ref >60)
GFR calc non Af Amer: 27 mL/min — ABNORMAL LOW (ref >60)
Glucose, Bld: 233 mg/dL — ABNORMAL HIGH (ref 70–99)
Potassium: 4.1 mmol/L (ref 3.5–5.1)
Sodium: 138 mmol/L (ref 135–145)
Total Bilirubin: 0.5 mg/dL (ref 0.3–1.2)
Total Protein: 7.1 g/dL (ref 6.5–8.1)

## 2020-02-09 LAB — GLUCOSE, CAPILLARY
Glucose-Capillary: 224 mg/dL — ABNORMAL HIGH (ref 70–99)
Glucose-Capillary: 211 mg/dL — ABNORMAL HIGH (ref 70–99)
Glucose-Capillary: 229 mg/dL — ABNORMAL HIGH (ref 70–99)
Glucose-Capillary: 234 mg/dL — ABNORMAL HIGH (ref 70–99)

## 2020-02-09 LAB — MAGNESIUM: Magnesium: 2.1 mg/dL (ref 1.7–2.4)

## 2020-02-09 LAB — FERRITIN: Ferritin: 167 ng/mL (ref 11–307)

## 2020-02-09 LAB — C-REACTIVE PROTEIN: CRP: 2.6 mg/dL — ABNORMAL HIGH (ref <1.0)

## 2020-02-09 LAB — D-DIMER, QUANTITATIVE: D-Dimer, Quant: 1.21 ug/mL-FEU — ABNORMAL HIGH (ref 0.00–0.50)

## 2020-02-09 NOTE — TOC Progression Note (Signed)
Transition of Care [facility]) - Progression Note    Patient Details  Name: [name] MRN: [id number] Date of Birth: [birth year]  Transition of Care [facility]) CM/SW Contact  Salome Arnt, [place] Phone Number: 02/09/2020, 3:46 PM  Clinical Narrative:  PT evaluated pt and recommendation is SNF. LCSW discussed with pt and pt's daughters on phone. They are agreeable and understand that placement will most likely be in [place] due to COVID +. LCSW called [employer] and pt is not managed by them, so SNF will need to start auth when accepted.        Expected Discharge Plan: [place] Barriers to Discharge: Continued Medical Work up  Expected Discharge Plan and Services Expected Discharge Plan: [place] In-house Referral: Clinical Social Work     Living arrangements for the past 2 months: Single Family Home                                       Social Determinants of Health (SDOH) Interventions    Readmission Risk Interventions Readmission Risk Prevention Plan 02/09/2020 07/27/2019  Transportation Screening Complete Complete  Medication Review (RN CM) - Complete  HRI or Home Care Consult Complete -  Social Work Consult for [place] Planning/Counseling Complete -  Palliative Care Screening Not Applicable -  Medication Review Press photographer) Complete -  Some recent data might be hidden

## 2020-02-09 NOTE — Progress Notes (Signed)
Patient Demographics:    [name], is a 85 y.o. female, DOB - 1934/06/28, MOQ:[id number]  Admit date - 02/08/2020   Admitting Physician Pratik Darleen Crocker, DO  Outpatient Primary MD for the patient is [name], MD  LOS - 1   Chief Complaint  Patient presents with  . Weakness        Subjective:    [name] today has no fevers, no emesis,  No chest pain,   -Shortness of breath and hypoxia persist, --- Pt is very very weak and becomes very very dyspneic and tachypneic and tachycardic with minimal activity--requires assist of 2 to transfer  Assessment  & Plan :    Active Problems:   Acute hypoxemic respiratory failure due to COVID-19 [facility])  Brief Summary:-  85 y.o. female with medical history significant for hypertension, type 2 diabetes, CKD stage IIIb, diastolic congestive heart failure grade 2, paroxysmal atrial fibrillation on Eliquis with chronic LBBB, and GERD admitted on 02/08/2020 with acute hypoxic respiratory failure secondary to Covid pneumonia  A/p 1)Acute hypoxic respiratory failure secondary to COVID-19 infection/Pneumonia--- The treatment plan and use of medications  for treatment of COVID-19 infection and possible side effects were discussed with patient/family -----Patient/Family verbalizes understanding and agrees to treatment protocols   --Patient is positive for COVID-19 infection, chest x-ray with findings of infiltrates/opacities,  patient is tachypneic/hypoxic and requiring continuous supplemental oxygen---patient meets criteria for initiation of Remdesivir AND Steroid therapy per protocol  --Check and trend inflammatory markers including D-dimer, ferritin and  CRP---also follow CBC and CMP --Supplemental oxygen to keep O2 sats above 93% -Follow serial chest x-rays and ABGs as indicated --- Encourage prone positioning for More than 16 hours/day in increments of 2 to 3 hours at  a time if able to tolerate --Attempt to maintain euvolemic state --Zinc and vitamin C as ordered -Albuterol inhaler as needed -Accu-Cheks/fingersticks while on high-dose steroids -PPI while on high-dose steroids -Enhanced dosage of anticoagulant for DVT prophylaxis given hypercoagulable state with COVID-19 infections   2) generalized weakness and deconditioning--- PT eval appreciated patient requires assist of 2, patient with significant dyspnea, tachycardia and tachypnea with activity -She will most likely need SNF rehab  3)CKD IIIb- AKI---- renally adjust medications, avoid nephrotoxic agents / dehydration  / hypotension  4)PAFIB--- continue Eliquis for stroke prophylaxis -No AV nodal blocking agent at this time, rate control appears adequate, actually appears sinus  5)DM2-continue Levemir and sliding scale, anticipate worsening glycemic control with steroids  6)HTN--currently on PTA hydralazine and isosorbide, torsemide on hold  7)HFpEF--acute on chronic diastolic dysfunction CHF, EF from February 2021 with grade 2 diastolic dysfunction and preserved EF of 60 to 65% -Appears compensated at this time,  continue to hold torsemide  Disposition/Need for in-Hospital Stay- patient unable to be discharged at  this time due to patient with significant dyspnea, tachycardia and tachypnea with activity0-requires IV remdesivir IV steroids and supplemental oxygen  Status is: Inpatient  Remains inpatient appropriate because: patient with significant dyspnea, tachycardia and tachypnea with activity0-requires IV remdesivir IV steroids and supplemental oxygen   Disposition: The patient is from: Home              Anticipated d/c is to: SNF  Anticipated d/c date is: > 3 days              Patient currently is not medically stable to d/c. Barriers: Not Clinically Stable-  patient with significant dyspnea, tachycardia and tachypnea with activity0-requires IV remdesivir IV steroids and  supplemental oxygen  Code Status : full   Family Communication:   Discussed with daughter with patient's permission  Consults  :  na  DVT Prophylaxis  : Apixaban  Lab Results  Component Value Date   PLT 180 02/09/2020    Inpatient Medications  Scheduled Meds: . apixaban  2.5 mg Oral BID  . vitamin C  500 mg Oral Daily  . dexamethasone (DECADRON) injection  6 mg Intravenous Q24H  . gabapentin  300 mg Oral BID  . hydrALAZINE  75 mg Oral BID  . insulin aspart  0-20 Units Subcutaneous TID WC  . insulin aspart  0-5 Units Subcutaneous QHS  . insulin detemir  80 Units Subcutaneous QHS  . isosorbide mononitrate  60 mg Oral Daily  . pantoprazole (PROTONIX) IV  40 mg Intravenous Q24H  . simvastatin  20 mg Oral q1800  . zinc sulfate  220 mg Oral Daily   Continuous Infusions: . remdesivir 100 mg in NS 100 mL 100 mg (02/09/20 0913)   PRN Meds:.acetaminophen, albuterol, chlorpheniramine-HYDROcodone, guaiFENesin-dextromethorphan, ondansetron **OR** ondansetron (ZOFRAN) IV    Anti-infectives (From admission, onward)   Start     Dose/Rate Route Frequency Ordered Stop   02/09/20 1000  remdesivir 100 mg in sodium chloride 0.9 % 100 mL IVPB        100 mg 200 mL/hr over 30 Minutes Intravenous Daily 02/08/20 1432 02/13/20 0959   02/08/20 1500  remdesivir 100 mg in sodium chloride 0.9 % 100 mL IVPB        100 mg 200 mL/hr over 30 Minutes Intravenous Every 30 min 02/08/20 1432 02/08/20 1656        Objective:   Vitals:   02/08/20 2330 02/09/20 0046 02/09/20 0543 02/09/20 1230  BP: 129/67 (!) 152/58 (!) 151/67 (!) 164/68  Pulse: 67 68 69   Resp: 19 18 18 18   Temp:  98.4 F (36.9 C) 98.7 F (37.1 C) (!) 100.7 F (38.2 C)  TempSrc:  Oral Oral Oral  SpO2: 96% 100% 100% 95%  Weight:  99.7 kg    Height:        Wt Readings from Last 3 Encounters:  02/09/20 99.7 kg  01/12/20 111.6 kg  01/03/20 108.6 kg     Intake/Output Summary (Last 24 hours) at 02/09/2020 1907 Last data  filed at 02/09/2020 1200 Gross per 24 hour  Intake 240 ml  Output 650 ml  Net -410 ml     Physical Exam  Gen:- Awake Alert, some conversational dyspnea HEENT:- [employer].AT, No sclera icterus Nose- Mill[place] 2L/min Neck-Supple Neck,No JVD,.  Lungs-diminished breath sounds, scattered rhonchi CV- S1, S2 normal, regular  Abd-  +ve B.Sounds, Abd Soft, No tenderness,    Extremity/Skin:- No  edema, pedal pulses present  Psych-affect is appropriate, oriented x3 Neuro-generalized weakness, no new focal deficits, no tremors   Data Review:   Micro Results Recent Results (from the past 240 hour(s))  Blood culture (routine x 2)     Status: None (Preliminary result)   Collection Time: 02/08/20 10:04 AM   Specimen: BLOOD  Result Value Ref Range Status   Specimen Description BLOOD LEFT ANTECUBITAL  Final   Special Requests   Final    BOTTLES DRAWN  AEROBIC AND ANAEROBIC Blood Culture adequate volume   Culture   Final    NO GROWTH < 24 HOURS Performed at [facility], [address] [place], [place] [address]    Report Status PENDING  Incomplete  Blood culture (routine x 2)     Status: None (Preliminary result)   Collection Time: 02/08/20 10:32 AM   Specimen: BLOOD RIGHT HAND  Result Value Ref Range Status   Specimen Description BLOOD RIGHT HAND  Final   Special Requests   Final    BOTTLES DRAWN AEROBIC AND ANAEROBIC Blood Culture results may not be optimal due to an inadequate volume of blood received in culture bottles   Culture   Final    NO GROWTH < 24 HOURS Performed at [facility], [address], [place], [place] [address]    Report Status PENDING  Incomplete  Respiratory Panel by RT PCR (Flu A&B, Covid) - Nasopharyngeal Swab     Status: Abnormal   Collection Time: 02/08/20 11:00 AM   Specimen: Nasopharyngeal Swab  Result Value Ref Range Status   SARS Coronavirus 2 by RT PCR POSITIVE (A) NEGATIVE Final    Comment: RESULT CALLED TO, READ BACK BY AND VERIFIED WITH: FARMER EBONY @1300  ON  02/08/20 BY TJ (NOTE) SARS-CoV-2 target nucleic acids are DETECTED.  SARS-CoV-2 RNA is generally detectable in upper respiratory specimens  during the acute phase of infection. Positive results are indicative of the presence of the identified virus, but do not rule out bacterial infection or co-infection with other pathogens not detected by the test. Clinical correlation with patient history and other diagnostic information is necessary to determine patient infection status. The expected result is Negative.  Fact Sheet for Patients:  PinkCheek.be  Fact Sheet for Healthcare Providers: GravelBags.it  This test is not yet approved or cleared by the [place] FDA and  has been authorized for detection and/or diagnosis of SARS-CoV-2 by FDA under an Emergency Use Authorization (EUA).  This EUA will remain in effect (meaning this test can be  used) for the duration of  the COVID-19 declaration under Section 564(b)(1) of the Act, 21 U.S.C. section 360bbb-3(b)(1), unless the authorization is terminated or revoked sooner.      Influenza A by PCR NEGATIVE NEGATIVE Final   Influenza B by PCR NEGATIVE NEGATIVE Final    Comment: (NOTE) The Xpert Xpress SARS-CoV-2/FLU/RSV assay is intended as an aid in  the diagnosis of influenza from Nasopharyngeal swab specimens and  should not be used as a sole basis for treatment. Nasal washings and  aspirates are unacceptable for Xpert Xpress SARS-CoV-2/FLU/RSV  testing.  Fact Sheet for Patients: PinkCheek.be  Fact Sheet for Healthcare Providers: GravelBags.it  This test is not yet approved or cleared by the [place] FDA and  has been authorized for detection and/or diagnosis of SARS-CoV-2 by  FDA under an Emergency Use Authorization (EUA). This EUA will remain  in effect (meaning this test can be used) for the duration of the    Covid-19 declaration under Section 564(b)(1) of the Act, 21  U.S.C. section 360bbb-3(b)(1), unless the authorization is  terminated or revoked. Performed at [facility], [address], [place], [place] [address]     Radiology Reports DG Chest Portable 1 View  Result Date: 02/08/2020 CLINICAL DATA:  Fever and cough for 2 weeks.  COVID status pending. EXAM: PORTABLE CHEST 1 VIEW COMPARISON:  January 01, 2020 FINDINGS: The mediastinal contour and cardiac silhouette are stable. The heart size is enlarged.  Increased pulmonary interstitium is identified bilaterally unchanged. No focal pneumonia or pleural effusion is identified. IMPRESSION: Chronic congestive heart failure.  No focal pneumonia. Electronically Signed   By: Abelardo Diesel M.D.   On: 02/08/2020 10:32     CBC Recent Labs  Lab 02/08/20 1003 02/09/20 0758  WBC 8.5 5.7  HGB 11.7* 10.5*  HCT 38.0 33.9*  PLT 234 180  MCV 91.1 89.9  MCH 28.1 27.9  MCHC 30.8 31.0  RDW 17.4* 16.6*  LYMPHSABS 2.4 1.4  MONOABS 0.6 0.5  EOSABS 0.0 0.0  BASOSABS 0.0 0.0    Chemistries  Recent Labs  Lab 02/08/20 1003 02/09/20 0758  NA 139 138  K 3.7 4.1  CL 104 104  CO2 24 25  GLUCOSE 216* 233*  BUN 35* 40*  CREATININE 2.00* 1.71*  CALCIUM 8.2* 8.0*  MG  --  2.1  AST 40 41  ALT 25 29  ALKPHOS 44 39  BILITOT 0.6 0.5   ------------------------------------------------------------------------------------------------------------------ Recent Labs    02/08/20 1000  TRIG 127    Lab Results  Component Value Date   HGBA1C 8.9 (H) 02/08/2020   ------------------------------------------------------------------------------------------------------------------ No results for input(s): TSH, T4TOTAL, T3FREE, THYROIDAB in the last 72 hours.  Invalid input(s): FREET3 ------------------------------------------------------------------------------------------------------------------ Recent Labs    02/08/20 1000 02/09/20 0758  FERRITIN 188  167    Coagulation profile No results for input(s): INR, PROTIME in the last 168 hours.  Recent Labs    02/08/20 1000 02/09/20 0758  DDIMER 1.34* 1.21*    Cardiac Enzymes No results for input(s): CKMB, TROPONINI, MYOGLOBIN in the last 168 hours.  Invalid input(s): CK ------------------------------------------------------------------------------------------------------------------    Component Value Date/Time   BNP 116.0 (H) 12/26/2019 3559     Roxan Hockey M.D on 02/09/2020 at 7:07 PM  Go to [email or web address] - for contact info  [facility] - Office  [phone]

## 2020-02-09 NOTE — TOC Initial Note (Signed)
Transition of Care [facility]) - Initial/Assessment Note    Patient Details  Name: [name] MRN: [id number] Date of Birth: 1934-09-16  Transition of Care [facility]) CM/SW Contact:    Salome Arnt, [place] Phone Number: 02/09/2020, 8:20 AM  Clinical Narrative:  Pt admitted for acute hypoxemic respiratory failure due to COVID-19. Assessment completed due to high risk readmission score. Pt was in hospital in August and went to [place] for rehab. She states she was at [facility] for about 2 weeks. Per pt, she is not active with any home health services. Pt lives with her son who works during the day. She indicates she manages fine while he is at work and has Meals on Wheels delivered daily. At baseline, pt ambulates with a walker. Pt plans to return home when medically stable. She would be open to home health if recommended. TOC to continue to follow.                  Expected Discharge Plan: [place] Barriers to Discharge: Continued Medical Work up   Patient Goals and CMS Choice Patient states their goals for this hospitalization and ongoing recovery are:: return home      Expected Discharge Plan and Services Expected Discharge Plan: [place] In-house Referral: Clinical Social Work     Living arrangements for the past 2 months: Single Family Home                                      Prior Living Arrangements/Services Living arrangements for the past 2 months: Single Family Home Lives with:: Adult Children Patient language and need for interpreter reviewed:: Yes        Need for Family Participation in Patient Care: Yes (Comment) Care giver support system in place?: Yes (comment) Current home services: DME (walker, wheelchair, 3N1) Criminal Activity/Legal Involvement Pertinent to Current Situation/Hospitalization: No - Comment as needed  Activities of Daily Living Home Assistive Devices/Equipment: Gilford Rile (specify type) ADL Screening  (condition at time of admission) Patient's cognitive ability adequate to safely complete daily activities?: Yes Is the patient deaf or have difficulty hearing?: No Does the patient have difficulty seeing, even when wearing glasses/contacts?: No Does the patient have difficulty concentrating, remembering, or making decisions?: No Patient able to express need for assistance with ADLs?: Yes Does the patient have difficulty dressing or bathing?: No Independently performs ADLs?: Yes (appropriate for developmental age) Does the patient have difficulty walking or climbing stairs?: Yes Weakness of Legs: None Weakness of Arms/Hands: None  Permission Sought/Granted                  Emotional Assessment Appearance:: Appears stated age Attitude/Demeanor/Rapport: Engaged Affect (typically observed): Accepting Orientation: : Oriented to Self, Oriented to  Time, Oriented to Situation, Oriented to Place Alcohol / Substance Use: Not Applicable Psych Involvement: No (comment)  Admission diagnosis:  Acute hypoxemic respiratory failure due to COVID-19 ([place]) [U07.1, J96.01] COVID-19 [U07.1] Patient Active Problem List   Diagnosis Date Noted  . Acute hypoxemic respiratory failure due to COVID-19 ([place]) 02/08/2020  . Educated about COVID-19 virus infection 01/11/2020  . Acute urinary retention   . Acute pulmonary edema (HCC)   . AKI (acute kidney injury) ([place]) 07/25/2019  . Acute respiratory failure with hypercapnia ([place]) 07/24/2019  . Pancreatitis, acute 07/23/2019  . Diabetes mellitus type 2 with complications ([place]) 61/44/3154  .  Atrial fibrillation, chronic ([place]) 07/23/2019  . Chronic diastolic CHF (congestive heart failure) (HCC)/EF > 60 % 07/23/2019  . Shortness of breath 06/19/2019  . Acute diastolic CHF (congestive heart failure) ([place]) 07/08/2017  . Acute respiratory failure with hypoxemia ([place]) 07/08/2017  . Uncontrolled type 2 diabetes mellitus with diabetic nephropathy, with long-term  current use of insulin ([place]) 07/08/2017  . CKD (chronic kidney disease), stage IIIb 07/08/2017  . Essential hypertension 07/08/2017  . Hypertension 06/23/2017  . Prolapsed internal hemorrhoids, grade 3 06/25/2016  . Abdominal pain 05/08/2015  . Dysphagia, pharyngoesophageal phase   . Other hemorrhoids   . Diverticulosis of colon without hemorrhage   . GERD (gastroesophageal reflux disease) 02/28/2014  . Constipation 02/28/2014  . Esophageal dysphagia 02/28/2014  . Rectal bleeding 02/28/2014  . OSTEOARTHRITIS, KNEE, RIGHT, SEVERE 12/13/2008  . DEGENERATIVE DISC DISEASE, LUMBOSACRAL SPINE W/RADICULOPATHY 12/13/2008  . BACK PAIN 12/13/2008  . DIABETES 04/13/2007  . FRACTURE, FEMUR, INTERTROCHANTERIC REGION 04/13/2007   PCP:  [name], MD Pharmacy:   [facility] [place] [address] Phone: [phone] Fax: [phone]     Social Determinants of Health (SDOH) Interventions    Readmission Risk Interventions Readmission Risk Prevention Plan 02/09/2020 07/27/2019  Transportation Screening Complete Complete  Medication Review (RN CM) - Complete  HRI or Home Care Consult Complete -  Social Work Consult for [place] Planning/Counseling Complete -  Palliative Care Screening Not Applicable -  Medication Review Press photographer) Complete -  Some recent data might be hidden

## 2020-02-09 NOTE — Evaluation (Signed)
Physical Therapy Evaluation Patient Details Name: [name] MRN: [id number] DOB: [birth year] Today's Date: 02/09/2020   History of Present Illness  [name] is a 85 y.o. female with medical history significant for hypertension, type 2 diabetes, CKD stage IIIb, diastolic congestive heart failure grade 2, paroxysmal atrial fibrillation on Eliquis with chronic LBBB, and GERD who presented to the emergency department with worsening weakness over the last 2 weeks and general unwellness.  She has developed a mild productive cough and has had some episodes of nausea and vomiting x2 earlier this morning.  She continues to remain somewhat nauseated, but denies any abdominal pain or diarrhea.  She denies any fevers or chills at home.  She also denies any significant chest pain, shortness of breath, dysuria, or any other symptomatic concerns.  She denies any exposure to sick contacts and states that she has not been vaccinated for Covid.  She did take Zofran earlier this morning with no improvement in symptoms.    Clinical Impression  Patient demonstrates slow labored movement for sitting up at bedside requiring HOB raised and Mod assist to help pull self to sitting, had most difficulty completing sit to stands due to BLE weakness and limited knee flexion, right knee worse than left, and limited to a few unsteady labored side steps secondary to fall risk and generalized weakness.  Patient tolerated sitting up in chair after therapy - RN notified.  Patient will benefit from continued physical therapy in hospital and recommended venue below to increase strength, balance, endurance for safe ADLs and gait.      Follow Up Recommendations SNF    Equipment Recommendations  None recommended by PT    Recommendations for Other Services       Precautions / Restrictions Restrictions Weight Bearing Restrictions: No      Mobility  Bed Mobility Overal bed mobility: Needs Assistance Bed Mobility: Supine to  Sit     Supine to sit: Mod assist;HOB elevated     General bed mobility comments: slow labored movement with head of bed raised  Transfers Overall transfer level: Needs assistance Equipment used: Rolling walker (2 wheeled) Transfers: Sit to/from [facility] Sit to Stand: Max assist Stand pivot transfers: Mod assist       General transfer comment: has diffiuclty completing sit to stands due BLE weakness and limited right knee flexion  Ambulation/Gait Ambulation/Gait assistance: Mod assist;Max assist Gait Distance (Feet): 3 Feet Assistive device: Rolling walker (2 wheeled) Gait Pattern/deviations: Decreased step length - right;Decreased step length - left;Decreased stride length Gait velocity: slow   General Gait Details: limited to 3-4 slow unsteady labored steps at bedside, limited due to fatigue, poor standing and fall risk  Stairs            Wheelchair Mobility    Modified Rankin (Stroke Patients Only)       Balance Overall balance assessment: Needs assistance Sitting-balance support: Feet supported;No upper extremity supported Sitting balance-Leahy Scale: Fair Sitting balance - Comments: fair/good seated at EOB   Standing balance support: During functional activity;Bilateral upper extremity supported Standing balance-Leahy Scale: Poor Standing balance comment: using RW                             Pertinent Vitals/Pain Pain Assessment: No/denies pain    Home Living Family/patient expects to be discharged to:: Private residence Living Arrangements: Children Available Help at Discharge: Family;Available PRN/intermittently Type of Home: House Home Access: Ramped  entrance     Home Layout: One level Home Equipment: [place] - 2 wheels;Bedside commode;Wheelchair - Brewing technologist;Toilet riser      Prior Function Level of Independence: Independent with assistive device(s)         Comments: household ambulator with RW      Hand Dominance   Dominant Hand: Right    Extremity/Trunk Assessment   Upper Extremity Assessment Upper Extremity Assessment: Generalized weakness    Lower Extremity Assessment Lower Extremity Assessment: Generalized weakness    Cervical / Trunk Assessment Cervical / Trunk Assessment: Normal  Communication   Communication: No difficulties  Cognition Arousal/Alertness: Awake/alert Behavior During Therapy: WFL for tasks assessed/performed Overall Cognitive Status: Within Functional Limits for tasks assessed                                        General Comments      Exercises     Assessment/Plan    PT Assessment Patient needs continued PT services  PT Problem List Decreased strength;Decreased activity tolerance;Decreased balance;Decreased mobility       PT Treatment Interventions Gait training;Stair training;Functional mobility training;Therapeutic exercise;Patient/family education    PT Goals (Current goals can be found in the Care Plan section)  Acute Rehab PT Goals Patient Stated Goal: return home with family to assist PT Goal Formulation: With patient Time For Goal Achievement: 02/22/20 Potential to Achieve Goals: Good    Frequency Min 3X/week   Barriers to discharge        Co-evaluation               AM-PAC PT "6 Clicks" Mobility  Outcome Measure Help needed turning from your back to your side while in a flat bed without using bedrails?: A Lot Help needed moving from lying on your back to sitting on the side of a flat bed without using bedrails?: A Lot Help needed moving to and from a bed to a chair (including a wheelchair)?: A Lot Help needed standing up from a chair using your arms (e.g., wheelchair or bedside chair)?: A Lot Help needed to walk in hospital room?: A Lot Help needed climbing 3-5 steps with a railing? : Total 6 Click Score: 11    End of Session   Activity Tolerance: Patient tolerated treatment  well;Patient limited by fatigue Patient left: in chair;with call bell/phone within reach Nurse Communication: Mobility status PT Visit Diagnosis: Unsteadiness on feet (R26.81);Other abnormalities of gait and mobility (R26.89);Muscle weakness (generalized) (M62.81)    Time: 9518-8416 PT Time Calculation (min) (ACUTE ONLY): 31 min   Charges:   PT Evaluation $PT Eval Moderate Complexity: 1 Mod PT Treatments $Therapeutic Activity: 23-37 mins        3:42 PM, 02/09/20 Lonell Grandchild, MPT Physical Therapist with [facility] [phone] office [phone] mobile phone

## 2020-02-09 NOTE — Care Management Important Message (Signed)
Important Message  Patient Details  Name: [name] MRN: [id number] Date of Birth: 10-07-1934   Medicare Important Message Given:  Yes - Important Message mailed due to current National Emergency     Tommy Medal 02/09/2020, 3:56 PM

## 2020-02-09 NOTE — Plan of Care (Signed)

## 2020-02-09 NOTE — NC FL2 (Signed)
[place] LEVEL OF CARE SCREENING TOOL     IDENTIFICATION  Patient Name: [name] Birthdate: 29-Jan-1935 Sex: female Admission Date (Current Location): 02/08/2020  [facility] and [place] Number:  [employer] and Address:  [place] [address], [place]      Provider Number: [id number]  Attending Physician Name and Address:  Roxan Hockey, MD  Relative Name and Phone Number:       Current Level of Care: Hospital Recommended Level of Care: [place] Prior Approval Number:    Date Approved/Denied:   PASRR Number: [id number]  Discharge Plan: SNF    Current Diagnoses: Patient Active Problem List   Diagnosis Date Noted  . Acute hypoxemic respiratory failure due to COVID-19 ([place]) 02/08/2020  . Educated about COVID-19 virus infection 01/11/2020  . Acute urinary retention   . Acute pulmonary edema (HCC)   . AKI (acute kidney injury) ([place]) 07/25/2019  . Acute respiratory failure with hypercapnia ([place]) 07/24/2019  . Pancreatitis, acute 07/23/2019  . Diabetes mellitus type 2 with complications ([place]) 12/75/1700  . Atrial fibrillation, chronic ([place]) 07/23/2019  . Chronic diastolic CHF (congestive heart failure) (HCC)/EF > 60 % 07/23/2019  . Shortness of breath 06/19/2019  . Acute diastolic CHF (congestive heart failure) ([place]) 07/08/2017  . Acute respiratory failure with hypoxemia ([place]) 07/08/2017  . Uncontrolled type 2 diabetes mellitus with diabetic nephropathy, with long-term current use of insulin ([place]) 07/08/2017  . CKD (chronic kidney disease), stage IIIb 07/08/2017  . Essential hypertension 07/08/2017  . Hypertension 06/23/2017  . Prolapsed internal hemorrhoids, grade 3 06/25/2016  . Abdominal pain 05/08/2015  . Dysphagia, pharyngoesophageal phase   . Other hemorrhoids   . Diverticulosis of colon without hemorrhage   . GERD (gastroesophageal reflux disease) 02/28/2014  . Constipation 02/28/2014  .  Esophageal dysphagia 02/28/2014  . Rectal bleeding 02/28/2014  . OSTEOARTHRITIS, KNEE, RIGHT, SEVERE 12/13/2008  . DEGENERATIVE DISC DISEASE, LUMBOSACRAL SPINE W/RADICULOPATHY 12/13/2008  . BACK PAIN 12/13/2008  . DIABETES 04/13/2007  . FRACTURE, FEMUR, INTERTROCHANTERIC REGION 04/13/2007    Orientation RESPIRATION BLADDER Height & Weight     Self, Time, Place, Situation  O2 (2 L) External catheter Weight: 219 lb 12.8 oz (99.7 kg) Height:  5\' 8"  (172.7 cm)  BEHAVIORAL SYMPTOMS/MOOD NEUROLOGICAL BOWEL NUTRITION STATUS      Continent Diet (Clear liquid. See d/c summary for updates.)  AMBULATORY STATUS COMMUNICATION OF NEEDS Skin   Extensive Assist Verbally Normal                       Personal Care Assistance Level of Assistance  Bathing, Feeding, Dressing Bathing Assistance: Maximum assistance Feeding assistance: Limited assistance Dressing Assistance: Maximum assistance     Functional Limitations Info  Sight, Hearing, Speech Sight Info: Adequate Hearing Info: Adequate Speech Info: Adequate    SPECIAL CARE FACTORS FREQUENCY  PT (By licensed PT)     PT Frequency: daily              Contractures      Additional Factors Info  Code Status, Allergies, Insulin Sliding Scale, Isolation Precautions Code Status Info: Full code Allergies Info: No known allergies.     Isolation Precautions Info: Airborne and contact precautions due to COVID 19 +.     Current Medications (02/09/2020):  This is the current hospital active medication list Current Facility-Administered Medications  Medication Dose Route Frequency Provider Last Rate Last Admin  . acetaminophen (TYLENOL) tablet  650 mg  650 mg Oral Q6H PRN Heath Lark D, DO   650 mg at 02/09/20 1245  . albuterol (VENTOLIN HFA) 108 (90 Base) MCG/ACT inhaler 2 puff  2 puff Inhalation Q6H PRN Manuella Ghazi, Pratik D, DO      . apixaban (ELIQUIS) tablet 2.5 mg  2.5 mg Oral BID Manuella Ghazi, Pratik D, DO   2.5 mg at 02/09/20 0915  . ascorbic  acid (VITAMIN C) tablet 500 mg  500 mg Oral Daily Manuella Ghazi, Pratik D, DO   500 mg at 02/09/20 0915  . chlorpheniramine-HYDROcodone (TUSSIONEX) 10-8 MG/5ML suspension 5 mL  5 mL Oral Q12H PRN Manuella Ghazi, Pratik D, DO      . dexamethasone (DECADRON) injection 6 mg  6 mg Intravenous Q24H Manuella Ghazi, Pratik D, DO   6 mg at 02/09/20 0911  . gabapentin (NEURONTIN) capsule 300 mg  300 mg Oral BID Manuella Ghazi, Pratik D, DO   300 mg at 02/09/20 0915  . guaiFENesin-dextromethorphan (ROBITUSSIN DM) 100-10 MG/5ML syrup 10 mL  10 mL Oral Q4H PRN Manuella Ghazi, Pratik D, DO      . hydrALAZINE (APRESOLINE) tablet 75 mg  75 mg Oral BID Manuella Ghazi, Pratik D, DO   75 mg at 02/09/20 0915  . insulin aspart (novoLOG) injection 0-20 Units  0-20 Units Subcutaneous TID WC Shah, Pratik D, DO   7 Units at 02/09/20 1245  . insulin aspart (novoLOG) injection 0-5 Units  0-5 Units Subcutaneous QHS Heath Lark D, DO   4 Units at 02/08/20 2156  . insulin detemir (LEVEMIR) injection 80 Units  80 Units Subcutaneous QHS Heath Lark D, DO   80 Units at 02/08/20 2154  . isosorbide mononitrate (IMDUR) 24 hr tablet 60 mg  60 mg Oral Daily Manuella Ghazi, Pratik D, DO   60 mg at 02/09/20 0915  . ondansetron (ZOFRAN) tablet 4 mg  4 mg Oral Q6H PRN Manuella Ghazi, Pratik D, DO       Or  . ondansetron (ZOFRAN) injection 4 mg  4 mg Intravenous Q6H PRN Manuella Ghazi, Pratik D, DO      . pantoprazole (PROTONIX) injection 40 mg  40 mg Intravenous Q24H Shah, Pratik D, DO   40 mg at 02/08/20 1514  . remdesivir 100 mg in sodium chloride 0.9 % 100 mL IVPB  100 mg Intravenous Daily Manuella Ghazi, Pratik D, DO 200 mL/hr at 02/09/20 0913 100 mg at 02/09/20 0913  . simvastatin (ZOCOR) tablet 20 mg  20 mg Oral q1800 Manuella Ghazi, Pratik D, DO   20 mg at 02/08/20 1710  . zinc sulfate capsule 220 mg  220 mg Oral Daily Manuella Ghazi, Pratik D, DO   220 mg at 02/09/20 0915   Facility-Administered Medications Ordered in Other Encounters  Medication Dose Route Frequency Provider Last Rate Last Admin  . regadenoson (LEXISCAN) injection SOLN 0.4 mg   0.4 mg Intravenous Once Croitoru, Mihai, MD         Discharge Medications: Please see discharge summary for a list of discharge medications.  Relevant Imaging Results:  Relevant Lab Results:   Additional Information SSN: [id number]. COVID +.  Salome Arnt, LCSW

## 2020-02-09 NOTE — Plan of Care (Signed)
  Problem: Acute Rehab PT Goals(only PT should resolve) Goal: Pt Will Go Supine/Side To Sit Outcome: Progressing Flowsheets (Taken 02/09/2020 1543) Pt will go Supine/Side to Sit: with minimal assist Goal: Patient Will Transfer Sit To/From Stand Outcome: Progressing Flowsheets (Taken 02/09/2020 1543) Patient will transfer sit to/from stand: with moderate assist Goal: Pt Will Transfer Bed To Chair/Chair To Bed Outcome: Progressing Flowsheets (Taken 02/09/2020 1543) Pt will Transfer Bed to Chair/Chair to Bed:  with mod assist  with min assist Goal: Pt Will Ambulate Outcome: Progressing Flowsheets (Taken 02/09/2020 1543) Pt will Ambulate:  15 feet  with moderate assist  with rolling walker   3:44 PM, 02/09/20 Lonell Grandchild, MPT Physical Therapist with [facility] [phone] office [phone] mobile phone

## 2020-02-09 NOTE — Progress Notes (Signed)
Inpatient Diabetes Program Recommendations  AACE/ADA: New Consensus Statement on Inpatient Glycemic Control (2015)  Target Ranges:  Prepandial:   less than 140 mg/dL      Peak postprandial:   less than 180 mg/dL (1-2 hours)      Critically ill patients:  140 - 180 mg/dL   Lab Results  Component Value Date   GLUCAP 211 (H) 02/09/2020   HGBA1C 8.9 (H) 02/08/2020    Review of Glycemic Control Results for [name], [name] (MRN [id number]) as of 02/09/2020 12:58  Ref. Range 02/08/2020 10:11 02/08/2020 17:01 02/08/2020 21:41 02/09/2020 08:15 02/09/2020 12:30  Glucose-Capillary Latest Ref Range: 70 - 99 mg/dL 221 (H) 289 (H) 303 (H) 224 (H) 211 (H)   Diabetes history: DM2 Outpatient Diabetes medications: Levemir 80 units + Novolog 0-10 tid meal coverage + Decadron 6 mg qd Current orders for Inpatient glycemic control: Levemir 80 units + Novolog resistant tid + hs 0-5 units  Inpatient Diabetes Program Recommendations:   -Add Novolog 5 units tid meal coverage if eats 50% Secure chat sent to Dr. Denton Brick.  Thank you, [name]. Caitlin Wassel, RN, MSN, CDE  Diabetes Coordinator Inpatient Glycemic Control Team Team Pager [phone] (8am-5pm) 02/09/2020 1:01 PM

## 2020-02-10 ENCOUNTER — Inpatient Hospital Stay (HOSPITAL_COMMUNITY): Payer: Medicare HMO

## 2020-02-10 LAB — COMPREHENSIVE METABOLIC PANEL
ALT: 32 U/L (ref 0–44)
AST: 52 U/L — ABNORMAL HIGH (ref 15–41)
Albumin: 2.7 g/dL — ABNORMAL LOW (ref 3.5–5.0)
Alkaline Phosphatase: 40 U/L (ref 38–126)
Anion gap: 10 (ref 5–15)
BUN: 44 mg/dL — ABNORMAL HIGH (ref 8–23)
CO2: 24 mmol/L (ref 22–32)
Calcium: 8.1 mg/dL — ABNORMAL LOW (ref 8.9–10.3)
Chloride: 104 mmol/L (ref 98–111)
Creatinine, Ser: 1.84 mg/dL — ABNORMAL HIGH (ref 0.44–1.00)
GFR calc Af Amer: 29 mL/min — ABNORMAL LOW (ref >60)
GFR calc non Af Amer: 25 mL/min — ABNORMAL LOW (ref >60)
Glucose, Bld: 138 mg/dL — ABNORMAL HIGH (ref 70–99)
Potassium: 3.9 mmol/L (ref 3.5–5.1)
Sodium: 138 mmol/L (ref 135–145)
Total Bilirubin: 0.6 mg/dL (ref 0.3–1.2)
Total Protein: 6.9 g/dL (ref 6.5–8.1)

## 2020-02-10 LAB — FERRITIN: Ferritin: 572 ng/mL — ABNORMAL HIGH (ref 11–307)

## 2020-02-10 LAB — D-DIMER, QUANTITATIVE: D-Dimer, Quant: 1.64 ug/mL-FEU — ABNORMAL HIGH (ref 0.00–0.50)

## 2020-02-10 LAB — CBC WITH DIFFERENTIAL/PLATELET
Abs Immature Granulocytes: 0.02 10*3/uL (ref 0.00–0.07)
Basophils Absolute: 0 10*3/uL (ref 0.0–0.1)
Basophils Relative: 0 %
Eosinophils Absolute: 0 10*3/uL (ref 0.0–0.5)
Eosinophils Relative: 0 %
HCT: 33.2 % — ABNORMAL LOW (ref 36.0–46.0)
Hemoglobin: 10.6 g/dL — ABNORMAL LOW (ref 12.0–15.0)
Immature Granulocytes: 0 %
Lymphocytes Relative: 20 %
Lymphs Abs: 1.3 10*3/uL (ref 0.7–4.0)
MCH: 27.9 pg (ref 26.0–34.0)
MCHC: 31.9 g/dL (ref 30.0–36.0)
MCV: 87.4 fL (ref 80.0–100.0)
Monocytes Absolute: 0.4 10*3/uL (ref 0.1–1.0)
Monocytes Relative: 6 %
Neutro Abs: 4.9 10*3/uL (ref 1.7–7.7)
Neutrophils Relative %: 74 %
Platelets: 183 10*3/uL (ref 150–400)
RBC: 3.8 MIL/uL — ABNORMAL LOW (ref 3.87–5.11)
RDW: 16.7 % — ABNORMAL HIGH (ref 11.5–15.5)
WBC: 6.6 10*3/uL (ref 4.0–10.5)
nRBC: 0 % (ref 0.0–0.2)

## 2020-02-10 LAB — GLUCOSE, CAPILLARY
Glucose-Capillary: 122 mg/dL — ABNORMAL HIGH (ref 70–99)
Glucose-Capillary: 173 mg/dL — ABNORMAL HIGH (ref 70–99)
Glucose-Capillary: 169 mg/dL — ABNORMAL HIGH (ref 70–99)
Glucose-Capillary: 117 mg/dL — ABNORMAL HIGH (ref 70–99)

## 2020-02-10 LAB — URINE CULTURE

## 2020-02-10 LAB — C-REACTIVE PROTEIN: CRP: 2.5 mg/dL — ABNORMAL HIGH (ref <1.0)

## 2020-02-10 LAB — MAGNESIUM: Magnesium: 2.2 mg/dL (ref 1.7–2.4)

## 2020-02-10 MED ORDER — METHOCARBAMOL 500 MG PO TABS
500.0000 mg | ORAL_TABLET | Freq: Once | ORAL | Status: AC
  Administered 2020-02-10: 500 mg via ORAL
  Filled 2020-02-10: qty 1

## 2020-02-10 NOTE — Progress Notes (Signed)
Patient Demographics:    [name], is a 85 y.o. female, DOB - 12-03-1934, EML:[id number]  Admit date - 02/08/2020   Admitting Physician Pratik Darleen Crocker, DO  Outpatient Primary MD for the patient is [name], MD  LOS - 2   Chief Complaint  Patient presents with  . Weakness        Subjective:    [name] today has no fevers, no emesis,  No chest pain,   -Remains symptomatic with dyspnea at rest, tachypnea and tachycardia, fevers and significant dyspnea on exertion with hypoxia --As the day went on was able to wean down oxygen  Assessment  & Plan :    Active Problems:   Acute hypoxemic respiratory failure due to COVID-19 [facility])  Brief Summary:-  85 y.o. female with medical history significant for hypertension, type 2 diabetes, CKD stage IIIb, diastolic congestive heart failure grade 2, paroxysmal atrial fibrillation on Eliquis with chronic LBBB, and GERD admitted on 02/08/2020 with acute hypoxic respiratory failure secondary to Covid pneumonia  A/p 1)Acute hypoxic respiratory failure secondary to COVID-19 infection/Pneumonia--- The treatment plan and use of medications  for treatment of COVID-19 infection and possible side effects were discussed with patient/family -----Patient/Family verbalizes understanding and agrees to treatment protocols   --Patient is positive for COVID-19 infection, chest x-ray with findings of infiltrates/opacities,  patient is tachypneic/hypoxic and requiring continuous supplemental oxygen---patient meets criteria for initiation of Remdesivir AND Steroid therapy per protocol  --Check and trend inflammatory markers including D-dimer, ferritin and  CRP---also follow CBC and CMP --Supplemental oxygen to keep O2 sats above 93% -Follow serial chest x-rays and ABGs as indicated --- Encourage prone positioning for More than 16 hours/day in increments of 2 to 3 hours at a time if  able to tolerate --Attempt to maintain euvolemic state --Zinc and vitamin C as ordered -Albuterol inhaler as needed -Accu-Cheks/fingersticks while on high-dose steroids -PPI while on high-dose steroids -Enhanced dosage of anticoagulant for DVT prophylaxis given hypercoagulable state with COVID-19 infections---patient is on apixaban -Continue IV remdesivir and steroids COVID-19 Labs  Recent Labs    02/08/20 1000 02/08/20 1630 02/09/20 0758 02/10/20 0741  DDIMER 1.34*  --  1.21* 1.64*  FERRITIN 188  --  167 572*  LDH  --  210*  --   --   CRP 2.5*  --  2.6* 2.5*    Lab Results  Component Value Date   SARSCOV2NAA POSITIVE (A) 02/08/2020   [place] NEGATIVE 01/03/2020   [place] NEGATIVE 12/26/2019   [place] NEGATIVE 07/31/2019   2) Generalized weakness and deconditioning--- PT eval appreciated patient requires assist of 2, patient with significant dyspnea, tachycardia and tachypnea with activity -She will most likely need SNF rehab  3)CKD IIIb- AKI---- renally adjust medications, avoid nephrotoxic agents / dehydration  / hypotension  4)PAFIB--- continue Eliquis for stroke prophylaxis -No AV nodal blocking agent at this time, rate control appears adequate, actually appears sinus  5)DM2-continue Levemir and sliding scale, anticipate worsening glycemic control with steroids  6)HTN--currently on PTA hydralazine and isosorbide, torsemide on hold  7)HFpEF--acute on chronic diastolic dysfunction CHF, EF from February 2021 with grade 2 diastolic dysfunction and preserved EF of 60 to 65% -Appears compensated at this time,  continue to hold torsemide  Disposition/Need for in-Hospital Stay- patient unable to be discharged at  this time due to patient with significant dyspnea, tachycardia and tachypnea with activity-requires IV remdesivir IV steroids and supplemental oxygen  Status is: Inpatient  Remains inpatient appropriate because: patient with significant dyspnea,  tachycardia and tachypnea with activity0-requires IV remdesivir IV steroids and supplemental oxygen   Disposition: The patient is from: Home              Anticipated d/c is to: SNF              Anticipated d/c date is: > 3 days              Patient currently is not medically stable to d/c. Barriers: Not Clinically Stable-  patient with significant dyspnea, tachycardia and tachypnea with activity0-requires IV remdesivir IV steroids and supplemental oxygen  Code Status : full   Family Communication:   Discussed with daughter with patient's permission  Consults  :  na  DVT Prophylaxis  : Apixaban  Lab Results  Component Value Date   PLT 183 02/10/2020    Inpatient Medications  Scheduled Meds: . apixaban  2.5 mg Oral BID  . vitamin C  500 mg Oral Daily  . dexamethasone (DECADRON) injection  6 mg Intravenous Q24H  . gabapentin  300 mg Oral BID  . hydrALAZINE  75 mg Oral BID  . insulin aspart  0-20 Units Subcutaneous TID WC  . insulin aspart  0-5 Units Subcutaneous QHS  . insulin detemir  80 Units Subcutaneous QHS  . isosorbide mononitrate  60 mg Oral Daily  . pantoprazole (PROTONIX) IV  40 mg Intravenous Q24H  . simvastatin  20 mg Oral q1800  . zinc sulfate  220 mg Oral Daily   Continuous Infusions: . remdesivir 100 mg in NS 100 mL Stopped (02/10/20 1038)   PRN Meds:.acetaminophen, albuterol, chlorpheniramine-HYDROcodone, guaiFENesin-dextromethorphan, ondansetron **OR** ondansetron (ZOFRAN) IV    Anti-infectives (From admission, onward)   Start     Dose/Rate Route Frequency Ordered Stop   02/09/20 1000  remdesivir 100 mg in sodium chloride 0.9 % 100 mL IVPB        100 mg 200 mL/hr over 30 Minutes Intravenous Daily 02/08/20 1432 02/13/20 0959   02/08/20 1500  remdesivir 100 mg in sodium chloride 0.9 % 100 mL IVPB        100 mg 200 mL/hr over 30 Minutes Intravenous Every 30 min 02/08/20 1432 02/08/20 1656        Objective:   Vitals:   02/09/20 2244 02/10/20 0644  02/10/20 0753 02/10/20 1453  BP: (!) 169/83 (!) 181/65  136/63  Pulse: 80 93  74  Resp: (!) 21 (!) 24  (!) 24  Temp: 100 F (37.8 C) (!) 103.1 F (39.5 C) 100 F (37.8 C) 99.1 F (37.3 C)  TempSrc: Oral Oral Oral Oral  SpO2: 94% 90%  98%  Weight:      Height:        Wt Readings from Last 3 Encounters:  02/09/20 99.7 kg  01/12/20 111.6 kg  01/03/20 108.6 kg     Intake/Output Summary (Last 24 hours) at 02/10/2020 1823 Last data filed at 02/10/2020 1700 Gross per 24 hour  Intake --  Output 1600 ml  Net -1600 ml     Physical Exam  Gen:- Awake Alert, some conversational dyspnea HEENT:- [place].AT, No sclera icterus Neck-Supple Neck,No JVD,.  Lungs-diminished breath sounds, scattered rhonchi CV- S1, S2 normal, regular  Abd-  +ve B.Sounds, Abd  Soft, No tenderness,    Extremity/Skin:- No  edema, pedal pulses present  Psych-affect is appropriate, oriented x3 Neuro-generalized weakness, no new focal deficits, no tremors   Data Review:   Micro Results Recent Results (from the past 240 hour(s))  Blood culture (routine x 2)     Status: None (Preliminary result)   Collection Time: 02/08/20 10:04 AM   Specimen: BLOOD  Result Value Ref Range Status   Specimen Description BLOOD LEFT ANTECUBITAL  Final   Special Requests   Final    BOTTLES DRAWN AEROBIC AND ANAEROBIC Blood Culture adequate volume   Culture   Final    NO GROWTH 2 DAYS Performed at [facility], [address], [place], [place] [address]    Report Status PENDING  Incomplete  Urine culture     Status: Abnormal   Collection Time: 02/08/20 10:04 AM   Specimen: Urine, Random  Result Value Ref Range Status   Specimen Description   Final    URINE, RANDOM Performed at [facility], [address], [place], [place] [address]    Special Requests   Final    NONE Performed at [facility], [address], [place], [place] [address]    Culture MULTIPLE SPECIES PRESENT, SUGGEST RECOLLECTION (A)  Final   Report Status  02/10/2020 FINAL  Final  Blood culture (routine x 2)     Status: None (Preliminary result)   Collection Time: 02/08/20 10:32 AM   Specimen: BLOOD RIGHT HAND  Result Value Ref Range Status   Specimen Description BLOOD RIGHT HAND  Final   Special Requests   Final    BOTTLES DRAWN AEROBIC AND ANAEROBIC Blood Culture results may not be optimal due to an inadequate volume of blood received in culture bottles   Culture   Final    NO GROWTH 2 DAYS Performed at [facility], [address] [place], [place] [address]    Report Status PENDING  Incomplete  Respiratory Panel by RT PCR (Flu A&B, Covid) - Nasopharyngeal Swab     Status: Abnormal   Collection Time: 02/08/20 11:00 AM   Specimen: Nasopharyngeal Swab  Result Value Ref Range Status   SARS Coronavirus 2 by RT PCR POSITIVE (A) NEGATIVE Final    Comment: RESULT CALLED TO, READ BACK BY AND VERIFIED WITH: FARMER EBONY @1300  ON 02/08/20 BY TJ (NOTE) SARS-CoV-2 target nucleic acids are DETECTED.  SARS-CoV-2 RNA is generally detectable in upper respiratory specimens  during the acute phase of infection. Positive results are indicative of the presence of the identified virus, but do not rule out bacterial infection or co-infection with other pathogens not detected by the test. Clinical correlation with patient history and other diagnostic information is necessary to determine patient infection status. The expected result is Negative.  Fact Sheet for Patients:  PinkCheek.be  Fact Sheet for Healthcare Providers: GravelBags.it  This test is not yet approved or cleared by the [place] FDA and  has been authorized for detection and/or diagnosis of SARS-CoV-2 by FDA under an Emergency Use Authorization (EUA).  This EUA will remain in effect (meaning this test can be  used) for the duration of  the COVID-19 declaration under Section 564(b)(1) of the Act, 21 U.S.C. section  360bbb-3(b)(1), unless the authorization is terminated or revoked sooner.      Influenza A by PCR NEGATIVE NEGATIVE Final   Influenza B by PCR NEGATIVE NEGATIVE Final    Comment: (NOTE) The Xpert Xpress SARS-CoV-2/FLU/RSV assay is intended as an aid in  the diagnosis of influenza from Nasopharyngeal swab specimens and  should not be used as a sole basis for treatment. Nasal washings and  aspirates are unacceptable for Xpert Xpress SARS-CoV-2/FLU/RSV  testing.  Fact Sheet for Patients: PinkCheek.be  Fact Sheet for Healthcare Providers: GravelBags.it  This test is not yet approved or cleared by the [place] FDA and  has been authorized for detection and/or diagnosis of SARS-CoV-2 by  FDA under an Emergency Use Authorization (EUA). This EUA will remain  in effect (meaning this test can be used) for the duration of the  Covid-19 declaration under Section 564(b)(1) of the Act, 21  U.S.C. section 360bbb-3(b)(1), unless the authorization is  terminated or revoked. Performed at [facility], [address], [place], [place] [address]     Radiology Reports DG CHEST PORT 1 VIEW  Result Date: 02/10/2020 CLINICAL DATA:  Cough and weakness for 2 weeks EXAM: PORTABLE CHEST 1 VIEW COMPARISON:  Two days ago FINDINGS: Patchy bilateral infiltrate with airspace disease most notable in the peripheral left lung. Prominent vascular markings. Mild cardiomegaly. No visible effusion or pneumothorax. IMPRESSION: Patchy bilateral infiltrate which is progressed. Atypical pneumonia is favored but there may also be failure. Electronically Signed   By: Monte Fantasia M.D.   On: 02/10/2020 08:42   DG Chest Portable 1 View  Result Date: 02/08/2020 CLINICAL DATA:  Fever and cough for 2 weeks.  COVID status pending. EXAM: PORTABLE CHEST 1 VIEW COMPARISON:  January 01, 2020 FINDINGS: The mediastinal contour and cardiac silhouette are stable. The heart  size is enlarged. Increased pulmonary interstitium is identified bilaterally unchanged. No focal pneumonia or pleural effusion is identified. IMPRESSION: Chronic congestive heart failure.  No focal pneumonia. Electronically Signed   By: Abelardo Diesel M.D.   On: 02/08/2020 10:32     CBC Recent Labs  Lab 02/08/20 1003 02/09/20 0758 02/10/20 0741  WBC 8.5 5.7 6.6  HGB 11.7* 10.5* 10.6*  HCT 38.0 33.9* 33.2*  PLT 234 180 183  MCV 91.1 89.9 87.4  MCH 28.1 27.9 27.9  MCHC 30.8 31.0 31.9  RDW 17.4* 16.6* 16.7*  LYMPHSABS 2.4 1.4 1.3  MONOABS 0.6 0.5 0.4  EOSABS 0.0 0.0 0.0  BASOSABS 0.0 0.0 0.0    Chemistries  Recent Labs  Lab 02/08/20 1003 02/09/20 0758 02/10/20 0741  NA 139 138 138  K 3.7 4.1 3.9  CL 104 104 104  CO2 24 25 24   GLUCOSE 216* 233* 138*  BUN 35* 40* 44*  CREATININE 2.00* 1.71* 1.84*  CALCIUM 8.2* 8.0* 8.1*  MG  --  2.1 2.2  AST 40 41 52*  ALT 25 29 32  ALKPHOS 44 39 40  BILITOT 0.6 0.5 0.6   ------------------------------------------------------------------------------------------------------------------ Recent Labs    02/08/20 1000  TRIG 127    Lab Results  Component Value Date   HGBA1C 8.9 (H) 02/08/2020   ------------------------------------------------------------------------------------------------------------------ No results for input(s): TSH, T4TOTAL, T3FREE, THYROIDAB in the last 72 hours.  Invalid input(s): FREET3 ------------------------------------------------------------------------------------------------------------------ Recent Labs    02/09/20 0758 02/10/20 0741  FERRITIN 167 572*    Coagulation profile No results for input(s): INR, PROTIME in the last 168 hours.  Recent Labs    02/09/20 0758 02/10/20 0741  DDIMER 1.21* 1.64*    Cardiac Enzymes No results for input(s): CKMB, TROPONINI, MYOGLOBIN in the last 168 hours.  Invalid input(s):  CK ------------------------------------------------------------------------------------------------------------------    Component Value Date/Time   BNP 116.0 (H) 12/26/2019 6948     Roxan Hockey M.D on  02/10/2020 at 6:23 PM  Go to [email or web address] - for contact info  [facility] - Office  [phone]

## 2020-02-10 NOTE — Progress Notes (Addendum)
MEWS Guidelines - (patients age 83 and over) Pt alert and oriented x4 at this time, slightly tachypneic RR of 24. Elevated temp of 103.1, tylenol PRN given and ice packs. MD notified. No new orders at this time. Oncoming RN will re-check temp and will continue to monitor patient.

## 2020-02-10 NOTE — Progress Notes (Signed)
BP medications given

## 2020-02-11 LAB — COMPREHENSIVE METABOLIC PANEL
ALT: 27 U/L (ref 0–44)
AST: 39 U/L (ref 15–41)
Albumin: 2.4 g/dL — ABNORMAL LOW (ref 3.5–5.0)
Alkaline Phosphatase: 36 U/L — ABNORMAL LOW (ref 38–126)
Anion gap: 10 (ref 5–15)
BUN: 48 mg/dL — ABNORMAL HIGH (ref 8–23)
CO2: 23 mmol/L (ref 22–32)
Calcium: 7.8 mg/dL — ABNORMAL LOW (ref 8.9–10.3)
Chloride: 106 mmol/L (ref 98–111)
Creatinine, Ser: 1.8 mg/dL — ABNORMAL HIGH (ref 0.44–1.00)
GFR calc Af Amer: 30 mL/min — ABNORMAL LOW (ref >60)
GFR calc non Af Amer: 26 mL/min — ABNORMAL LOW (ref >60)
Glucose, Bld: 126 mg/dL — ABNORMAL HIGH (ref 70–99)
Potassium: 4 mmol/L (ref 3.5–5.1)
Sodium: 139 mmol/L (ref 135–145)
Total Bilirubin: 0.8 mg/dL (ref 0.3–1.2)
Total Protein: 6.3 g/dL — ABNORMAL LOW (ref 6.5–8.1)

## 2020-02-11 LAB — CBC WITH DIFFERENTIAL/PLATELET
Abs Immature Granulocytes: 0.01 10*3/uL (ref 0.00–0.07)
Basophils Absolute: 0 10*3/uL (ref 0.0–0.1)
Basophils Relative: 0 %
Eosinophils Absolute: 0 10*3/uL (ref 0.0–0.5)
Eosinophils Relative: 0 %
HCT: 34 % — ABNORMAL LOW (ref 36.0–46.0)
Hemoglobin: 10.6 g/dL — ABNORMAL LOW (ref 12.0–15.0)
Immature Granulocytes: 0 %
Lymphocytes Relative: 39 %
Lymphs Abs: 2.2 10*3/uL (ref 0.7–4.0)
MCH: 27.5 pg (ref 26.0–34.0)
MCHC: 31.2 g/dL (ref 30.0–36.0)
MCV: 88.1 fL (ref 80.0–100.0)
Monocytes Absolute: 0.2 10*3/uL (ref 0.1–1.0)
Monocytes Relative: 4 %
Neutro Abs: 3.1 10*3/uL (ref 1.7–7.7)
Neutrophils Relative %: 57 %
Platelets: 161 10*3/uL (ref 150–400)
RBC: 3.86 MIL/uL — ABNORMAL LOW (ref 3.87–5.11)
RDW: 17 % — ABNORMAL HIGH (ref 11.5–15.5)
WBC: 5.6 10*3/uL (ref 4.0–10.5)
nRBC: 0 % (ref 0.0–0.2)

## 2020-02-11 LAB — MAGNESIUM: Magnesium: 2.3 mg/dL (ref 1.7–2.4)

## 2020-02-11 LAB — D-DIMER, QUANTITATIVE: D-Dimer, Quant: 1.47 ug/mL-FEU — ABNORMAL HIGH (ref 0.00–0.50)

## 2020-02-11 LAB — C-REACTIVE PROTEIN: CRP: 5.8 mg/dL — ABNORMAL HIGH (ref <1.0)

## 2020-02-11 LAB — GLUCOSE, CAPILLARY
Glucose-Capillary: 125 mg/dL — ABNORMAL HIGH (ref 70–99)
Glucose-Capillary: 230 mg/dL — ABNORMAL HIGH (ref 70–99)
Glucose-Capillary: 386 mg/dL — ABNORMAL HIGH (ref 70–99)
Glucose-Capillary: 380 mg/dL — ABNORMAL HIGH (ref 70–99)

## 2020-02-11 LAB — FERRITIN: Ferritin: 747 ng/mL — ABNORMAL HIGH (ref 11–307)

## 2020-02-11 NOTE — Progress Notes (Signed)
Patient alert and oriented throughout shift. Patient denies wearing oxygen at home. Her oxygen sat on room air has been 94-97%. Patient does have shortness of breath on exertion. Patient encouraged to do deep breathing and to turn frequently in bed. Patient does need assistance with turning. Turned Q2 hours. Patient did get up to chair for approximately 3 hours. She does need a 2 person assist and is very unsteady on her feet. She does use the walker. Patient Tmax was 99.1. All other vital signs stable. Evening blood glucose was 386. She said she drank regular sweet tea at lunch. She was covered with 20 units of insulin. Education provided on diabetic diet. No other concerns. Will continue to monitor. Potential discharge tomorrow.

## 2020-02-11 NOTE — Progress Notes (Signed)
Patient Demographics:    [name], is a 85 y.o. female, DOB - 12/18/1934, YCX:[id number]  Admit date - 02/08/2020   Admitting Physician Pratik Darleen Crocker, DO  Outpatient Primary MD for the patient is [name], MD  LOS - 3   Chief Complaint  Patient presents with   Weakness        Subjective:    [name] today has no fevers, no emesis,  No chest pain,    -Dyspnea and hypoxia improving  Assessment  & Plan :    Active Problems:   Acute hypoxemic respiratory failure due to COVID-19 [facility])  Brief Summary:-  85 y.o. female with medical history significant for hypertension, type 2 diabetes, CKD stage IIIb, diastolic congestive heart failure grade 2, paroxysmal atrial fibrillation on Eliquis with chronic LBBB, and GERD admitted on 02/08/2020 with acute hypoxic respiratory failure secondary to Covid pneumonia  A/p 1)Acute hypoxic respiratory failure secondary to COVID-19 infection/Pneumonia--- The treatment plan and use of medications  for treatment of COVID-19 infection and possible side effects were discussed with patient/family -----Patient/Family verbalizes understanding and agrees to treatment protocols  -currently on 3 L of oxygen via nasal cannula --Patient is positive for COVID-19 infection, chest x-ray with findings of infiltrates/opacities,  patient is tachypneic/hypoxic and requiring continuous supplemental oxygen---patient meets criteria for initiation of Remdesivir AND Steroid therapy per protocol  --Check and trend inflammatory markers including D-dimer, ferritin and  CRP---also follow CBC and CMP --Supplemental oxygen to keep O2 sats above 93% -Follow serial chest x-rays and ABGs as indicated --- Encourage prone positioning for More than 16 hours/day in increments of 2 to 3 hours at a time if able to tolerate --Attempt to maintain euvolemic state --Zinc and vitamin C as  ordered -Albuterol inhaler as needed -Accu-Cheks/fingersticks while on high-dose steroids -PPI while on high-dose steroids -Enhanced dosage of anticoagulant for DVT prophylaxis given hypercoagulable state with COVID-19 infections---patient is on apixaban -Continue IV remdesivir and steroids COVID-19 Labs  Recent Labs    02/09/20 0758 02/10/20 0741 02/11/20 0813  DDIMER 1.21* 1.64* 1.47*  FERRITIN 167 572* 747*  CRP 2.6* 2.5* 5.8*    Lab Results  Component Value Date   SARSCOV2NAA POSITIVE (A) 02/08/2020   [place] NEGATIVE 01/03/2020   [place] NEGATIVE 12/26/2019   [place] NEGATIVE 07/31/2019   2) Generalized weakness and deconditioning--- PT eval appreciated patient requires assist of 2, patient with significant dyspnea, tachycardia and tachypnea with activity - SNF rehab recommended  3) AKI superimposed on CKD IIIb- -- renally adjust medications, avoid nephrotoxic agents / dehydration  / hypotension -Creatinine currently stable around 1.8  4)PAFIB--- continue Eliquis for stroke prophylaxis -No AV nodal blocking agent at this time, rate control appears adequate, actually appears sinus  5)DM2-continue Levemir, anticipate worsening glycemic control with steroids Use Novolog/Humalog Sliding scale insulin with Accu-Cheks/Fingersticks as ordered   6)HTN--currently on PTA hydralazine and isosorbide, torsemide on hold  7)HFpEF--acute on chronic diastolic dysfunction CHF, EF from February 2021 with grade 2 diastolic dysfunction and preserved EF of 60 to 65% -Appears compensated at this time,  continue to hold torsemide  Disposition/Need for in-Hospital Stay- patient unable to be discharged at  this time due to patient with significant dyspnea, tachycardia and tachypnea with activity-requires IV  remdesivir IV steroids and supplemental oxygen  Status is: Inpatient  Remains inpatient appropriate because: patient with significant dyspnea, tachycardia and tachypnea with  activity0-requires IV remdesivir IV steroids and supplemental oxygen   Disposition: The patient is from: Home              Anticipated d/c is to: SNF              Anticipated d/c date is: > 3 days              Patient currently --- anticipate patient will be medically ready on 02/12/2020 but may have to be discharged to SNF on 02 Barriers: Not Clinically Stable-  -awaiting insurance approval for SNF placement  Code Status : full   Family Communication:   Discussed with daughter with patient's permission  Consults  :  na  DVT Prophylaxis  : Apixaban  Lab Results  Component Value Date   PLT 161 02/11/2020    Inpatient Medications  Scheduled Meds:  apixaban  2.5 mg Oral BID   vitamin C  500 mg Oral Daily   dexamethasone (DECADRON) injection  6 mg Intravenous Q24H   gabapentin  300 mg Oral BID   hydrALAZINE  75 mg Oral BID   insulin aspart  0-20 Units Subcutaneous TID WC   insulin aspart  0-5 Units Subcutaneous QHS   insulin detemir  80 Units Subcutaneous QHS   isosorbide mononitrate  60 mg Oral Daily   pantoprazole (PROTONIX) IV  40 mg Intravenous Q24H   simvastatin  20 mg Oral q1800   zinc sulfate  220 mg Oral Daily   Continuous Infusions:  remdesivir 100 mg in NS 100 mL 100 mg (02/11/20 1102)   PRN Meds:.acetaminophen, albuterol, chlorpheniramine-HYDROcodone, guaiFENesin-dextromethorphan, ondansetron **OR** ondansetron (ZOFRAN) IV    Anti-infectives (From admission, onward)   Start     Dose/Rate Route Frequency Ordered Stop   02/09/20 1000  remdesivir 100 mg in sodium chloride 0.9 % 100 mL IVPB        100 mg 200 mL/hr over 30 Minutes Intravenous Daily 02/08/20 1432 02/13/20 0959   02/08/20 1500  remdesivir 100 mg in sodium chloride 0.9 % 100 mL IVPB        100 mg 200 mL/hr over 30 Minutes Intravenous Every 30 min 02/08/20 1432 02/08/20 1656        Objective:   Vitals:   02/10/20 2224 02/11/20 0522 02/11/20 0813 02/11/20 1428  BP: (!) 164/67 (!)  153/62 136/63 (!) 162/63  Pulse: 80 77 73 86  Resp: 18 16 17  (!) 22  Temp: 98.9 F (37.2 C) 98.4 F (36.9 C) 98.4 F (36.9 C) 99 F (37.2 C)  TempSrc: Oral Oral Oral Oral  SpO2: 96% 97% 99% 94%  Weight:      Height:        Wt Readings from Last 3 Encounters:  02/09/20 99.7 kg  01/12/20 111.6 kg  01/03/20 108.6 kg     Intake/Output Summary (Last 24 hours) at 02/11/2020 1717 Last data filed at 02/11/2020 1143 Gross per 24 hour  Intake --  Output 1250 ml  Net -1250 ml   Physical Exam Gen:- Awake Alert, some conversational dyspnea HEENT:- [place].AT, No sclera icterus Nose- [place] 3L/min Neck-Supple Neck,No JVD,.  Lungs-diminished breath sounds, scattered rhonchi CV- S1, S2 normal, regular  Abd-  +ve B.Sounds, Abd Soft, No tenderness,    Extremity/Skin:- No  edema, pedal pulses present  Psych-affect is appropriate, oriented x3 Neuro-generalized weakness, no new  focal deficits, no tremors   Data Review:   Micro Results Recent Results (from the past 240 hour(s))  Blood culture (routine x 2)     Status: None (Preliminary result)   Collection Time: 02/08/20 10:04 AM   Specimen: BLOOD  Result Value Ref Range Status   Specimen Description BLOOD LEFT ANTECUBITAL  Final   Special Requests   Final    BOTTLES DRAWN AEROBIC AND ANAEROBIC Blood Culture adequate volume   Culture   Final    NO GROWTH 3 DAYS Performed at [facility], [address], [place], [place] [address]    Report Status PENDING  Incomplete  Urine culture     Status: Abnormal   Collection Time: 02/08/20 10:04 AM   Specimen: Urine, Random  Result Value Ref Range Status   Specimen Description   Final    URINE, RANDOM Performed at [facility], [address], [place], [place] [address]    Special Requests   Final    NONE Performed at [facility], [address] [place], [place] [address]    Culture MULTIPLE SPECIES PRESENT, SUGGEST RECOLLECTION (A)  Final   Report Status 02/10/2020 FINAL  Final  Blood  culture (routine x 2)     Status: None (Preliminary result)   Collection Time: 02/08/20 10:32 AM   Specimen: BLOOD RIGHT HAND  Result Value Ref Range Status   Specimen Description BLOOD RIGHT HAND  Final   Special Requests   Final    BOTTLES DRAWN AEROBIC AND ANAEROBIC Blood Culture results may not be optimal due to an inadequate volume of blood received in culture bottles   Culture   Final    NO GROWTH 3 DAYS Performed at [facility], [address] [place], [place] [address]    Report Status PENDING  Incomplete  Respiratory Panel by RT PCR (Flu A&B, Covid) - Nasopharyngeal Swab     Status: Abnormal   Collection Time: 02/08/20 11:00 AM   Specimen: Nasopharyngeal Swab  Result Value Ref Range Status   SARS Coronavirus 2 by RT PCR POSITIVE (A) NEGATIVE Final    Comment: RESULT CALLED TO, READ BACK BY AND VERIFIED WITH: FARMER EBONY @1300  ON 02/08/20 BY TJ (NOTE) SARS-CoV-2 target nucleic acids are DETECTED.  SARS-CoV-2 RNA is generally detectable in upper respiratory specimens  during the acute phase of infection. Positive results are indicative of the presence of the identified virus, but do not rule out bacterial infection or co-infection with other pathogens not detected by the test. Clinical correlation with patient history and other diagnostic information is necessary to determine patient infection status. The expected result is Negative.  Fact Sheet for Patients:  PinkCheek.be  Fact Sheet for Healthcare Providers: GravelBags.it  This test is not yet approved or cleared by the [place] FDA and  has been authorized for detection and/or diagnosis of SARS-CoV-2 by FDA under an Emergency Use Authorization (EUA).  This EUA will remain in effect (meaning this test can be  used) for the duration of  the COVID-19 declaration under Section 564(b)(1) of the Act, 21 U.S.C. section 360bbb-3(b)(1), unless the authorization  is terminated or revoked sooner.      Influenza A by PCR NEGATIVE NEGATIVE Final   Influenza B by PCR NEGATIVE NEGATIVE Final    Comment: (NOTE) The Xpert Xpress SARS-CoV-2/FLU/RSV assay is intended as an aid in  the diagnosis of influenza from Nasopharyngeal swab specimens and  should not be used as a sole basis for treatment. Nasal washings  and  aspirates are unacceptable for Xpert Xpress SARS-CoV-2/FLU/RSV  testing.  Fact Sheet for Patients: PinkCheek.be  Fact Sheet for Healthcare Providers: GravelBags.it  This test is not yet approved or cleared by the [place] FDA and  has been authorized for detection and/or diagnosis of SARS-CoV-2 by  FDA under an Emergency Use Authorization (EUA). This EUA will remain  in effect (meaning this test can be used) for the duration of the  Covid-19 declaration under Section 564(b)(1) of the Act, 21  U.S.C. section 360bbb-3(b)(1), unless the authorization is  terminated or revoked. Performed at [facility], [address] [place], [place] [address]     Radiology Reports DG CHEST PORT 1 VIEW  Result Date: 02/10/2020 CLINICAL DATA:  Cough and weakness for 2 weeks EXAM: PORTABLE CHEST 1 VIEW COMPARISON:  Two days ago FINDINGS: Patchy bilateral infiltrate with airspace disease most notable in the peripheral left lung. Prominent vascular markings. Mild cardiomegaly. No visible effusion or pneumothorax. IMPRESSION: Patchy bilateral infiltrate which is progressed. Atypical pneumonia is favored but there may also be failure. Electronically Signed   By: Monte Fantasia M.D.   On: 02/10/2020 08:42   DG Chest Portable 1 View  Result Date: 02/08/2020 CLINICAL DATA:  Fever and cough for 2 weeks.  COVID status pending. EXAM: PORTABLE CHEST 1 VIEW COMPARISON:  January 01, 2020 FINDINGS: The mediastinal contour and cardiac silhouette are stable. The heart size is enlarged. Increased pulmonary  interstitium is identified bilaterally unchanged. No focal pneumonia or pleural effusion is identified. IMPRESSION: Chronic congestive heart failure.  No focal pneumonia. Electronically Signed   By: Abelardo Diesel M.D.   On: 02/08/2020 10:32     CBC Recent Labs  Lab 02/08/20 1003 02/09/20 0758 02/10/20 0741 02/11/20 0813  WBC 8.5 5.7 6.6 5.6  HGB 11.7* 10.5* 10.6* 10.6*  HCT 38.0 33.9* 33.2* 34.0*  PLT 234 180 183 161  MCV 91.1 89.9 87.4 88.1  MCH 28.1 27.9 27.9 27.5  MCHC 30.8 31.0 31.9 31.2  RDW 17.4* 16.6* 16.7* 17.0*  LYMPHSABS 2.4 1.4 1.3 2.2  MONOABS 0.6 0.5 0.4 0.2  EOSABS 0.0 0.0 0.0 0.0  BASOSABS 0.0 0.0 0.0 0.0    Chemistries  Recent Labs  Lab 02/08/20 1003 02/09/20 0758 02/10/20 0741 02/11/20 0813  NA 139 138 138 139  K 3.7 4.1 3.9 4.0  CL 104 104 104 106  CO2 24 25 24 23   GLUCOSE 216* 233* 138* 126*  BUN 35* 40* 44* 48*  CREATININE 2.00* 1.71* 1.84* 1.80*  CALCIUM 8.2* 8.0* 8.1* 7.8*  MG  --  2.1 2.2 2.3  AST 40 41 52* 39  ALT 25 29 32 27  ALKPHOS 44 39 40 36*  BILITOT 0.6 0.5 0.6 0.8   ------------------------------------------------------------------------------------------------------------------ No results for input(s): CHOL, HDL, LDLCALC, TRIG, CHOLHDL, LDLDIRECT in the last 72 hours.  Lab Results  Component Value Date   HGBA1C 8.9 (H) 02/08/2020   ------------------------------------------------------------------------------------------------------------------ No results for input(s): TSH, T4TOTAL, T3FREE, THYROIDAB in the last 72 hours.  Invalid input(s): FREET3 ------------------------------------------------------------------------------------------------------------------ Recent Labs    02/10/20 0741 02/11/20 0813  FERRITIN 572* 747*    Coagulation profile No results for input(s): INR, PROTIME in the last 168 hours.  Recent Labs    02/10/20 0741 02/11/20 0813  DDIMER 1.64* 1.47*    Cardiac Enzymes No results for input(s):  CKMB, TROPONINI, MYOGLOBIN in the last 168 hours.  Invalid input(s): CK ------------------------------------------------------------------------------------------------------------------    Component Value Date/Time   BNP 116.0 (H)  12/26/2019 8241    Roxan Hockey M.D on 02/11/2020 at 5:17 PM  Go to [email or web address] - for contact info  [facility] - Office  [phone]

## 2020-02-12 DIAGNOSIS — J810 Acute pulmonary edema: Secondary | ICD-10-CM | POA: Diagnosis not present

## 2020-02-12 DIAGNOSIS — Z743 Need for continuous supervision: Secondary | ICD-10-CM | POA: Diagnosis not present

## 2020-02-12 DIAGNOSIS — I5032 Chronic diastolic (congestive) heart failure: Secondary | ICD-10-CM | POA: Diagnosis not present

## 2020-02-12 DIAGNOSIS — R29898 Other symptoms and signs involving the musculoskeletal system: Secondary | ICD-10-CM | POA: Diagnosis not present

## 2020-02-12 DIAGNOSIS — U071 COVID-19: Secondary | ICD-10-CM | POA: Diagnosis present

## 2020-02-12 DIAGNOSIS — R41841 Cognitive communication deficit: Secondary | ICD-10-CM | POA: Diagnosis not present

## 2020-02-12 DIAGNOSIS — E785 Hyperlipidemia, unspecified: Secondary | ICD-10-CM | POA: Diagnosis not present

## 2020-02-12 DIAGNOSIS — R531 Weakness: Secondary | ICD-10-CM | POA: Diagnosis not present

## 2020-02-12 DIAGNOSIS — M170 Bilateral primary osteoarthritis of knee: Secondary | ICD-10-CM | POA: Diagnosis not present

## 2020-02-12 DIAGNOSIS — E782 Mixed hyperlipidemia: Secondary | ICD-10-CM | POA: Diagnosis not present

## 2020-02-12 DIAGNOSIS — J1282 Pneumonia due to coronavirus disease 2019: Secondary | ICD-10-CM | POA: Diagnosis present

## 2020-02-12 DIAGNOSIS — B9732 Oncovirus as the cause of diseases classified elsewhere: Secondary | ICD-10-CM | POA: Diagnosis not present

## 2020-02-12 DIAGNOSIS — M6259 Muscle wasting and atrophy, not elsewhere classified, multiple sites: Secondary | ICD-10-CM | POA: Diagnosis not present

## 2020-02-12 DIAGNOSIS — M6281 Muscle weakness (generalized): Secondary | ICD-10-CM | POA: Diagnosis not present

## 2020-02-12 DIAGNOSIS — I509 Heart failure, unspecified: Secondary | ICD-10-CM | POA: Diagnosis not present

## 2020-02-12 DIAGNOSIS — J9601 Acute respiratory failure with hypoxia: Secondary | ICD-10-CM | POA: Diagnosis not present

## 2020-02-12 DIAGNOSIS — E119 Type 2 diabetes mellitus without complications: Secondary | ICD-10-CM | POA: Diagnosis not present

## 2020-02-12 DIAGNOSIS — I10 Essential (primary) hypertension: Secondary | ICD-10-CM | POA: Diagnosis not present

## 2020-02-12 DIAGNOSIS — M1830 Unilateral post-traumatic osteoarthritis of first carpometacarpal joint, unspecified hand: Secondary | ICD-10-CM | POA: Diagnosis not present

## 2020-02-12 DIAGNOSIS — I4891 Unspecified atrial fibrillation: Secondary | ICD-10-CM | POA: Diagnosis not present

## 2020-02-12 DIAGNOSIS — N183 Chronic kidney disease, stage 3 unspecified: Secondary | ICD-10-CM | POA: Diagnosis not present

## 2020-02-12 DIAGNOSIS — N1832 Chronic kidney disease, stage 3b: Secondary | ICD-10-CM | POA: Diagnosis not present

## 2020-02-12 DIAGNOSIS — R2681 Unsteadiness on feet: Secondary | ICD-10-CM | POA: Diagnosis not present

## 2020-02-12 DIAGNOSIS — K859 Acute pancreatitis without necrosis or infection, unspecified: Secondary | ICD-10-CM | POA: Diagnosis not present

## 2020-02-12 LAB — CBC WITH DIFFERENTIAL/PLATELET
Abs Immature Granulocytes: 0.02 10*3/uL (ref 0.00–0.07)
Band Neutrophils: 0 %
Basophils Absolute: 0 10*3/uL (ref 0.0–0.1)
Basophils Relative: 0 %
Blasts: 0 %
Eosinophils Absolute: 0 10*3/uL (ref 0.0–0.5)
Eosinophils Relative: 0 %
HCT: 30.8 % — ABNORMAL LOW (ref 36.0–46.0)
Hemoglobin: 9.8 g/dL — ABNORMAL LOW (ref 12.0–15.0)
Immature Granulocytes: 0 %
Lymphocytes Relative: 33 %
Lymphs Abs: 1.6 10*3/uL (ref 0.7–4.0)
MCH: 28 pg (ref 26.0–34.0)
MCHC: 31.8 g/dL (ref 30.0–36.0)
MCV: 88 fL (ref 80.0–100.0)
Metamyelocytes Relative: 0 %
Monocytes Absolute: 0.2 10*3/uL (ref 0.1–1.0)
Monocytes Relative: 4 %
Myelocytes: 0 %
Neutro Abs: 3 10*3/uL (ref 1.7–7.7)
Neutrophils Relative %: 62 %
Platelets: 137 10*3/uL — ABNORMAL LOW (ref 150–400)
Promyelocytes Relative: 0 %
RBC: 3.5 MIL/uL — ABNORMAL LOW (ref 3.87–5.11)
RDW: 16.9 % — ABNORMAL HIGH (ref 11.5–15.5)
WBC: 4.8 10*3/uL (ref 4.0–10.5)
nRBC: 0 % (ref 0.0–0.2)
nRBC: 0 /100 WBC

## 2020-02-12 LAB — COMPREHENSIVE METABOLIC PANEL
ALT: 20 U/L (ref 0–44)
AST: 25 U/L (ref 15–41)
Albumin: 2.3 g/dL — ABNORMAL LOW (ref 3.5–5.0)
Alkaline Phosphatase: 36 U/L — ABNORMAL LOW (ref 38–126)
Anion gap: 9 (ref 5–15)
BUN: 60 mg/dL — ABNORMAL HIGH (ref 8–23)
CO2: 24 mmol/L (ref 22–32)
Calcium: 7.7 mg/dL — ABNORMAL LOW (ref 8.9–10.3)
Chloride: 105 mmol/L (ref 98–111)
Creatinine, Ser: 2.06 mg/dL — ABNORMAL HIGH (ref 0.44–1.00)
GFR calc Af Amer: 25 mL/min — ABNORMAL LOW (ref >60)
GFR calc non Af Amer: 22 mL/min — ABNORMAL LOW (ref >60)
Glucose, Bld: 233 mg/dL — ABNORMAL HIGH (ref 70–99)
Potassium: 3.9 mmol/L (ref 3.5–5.1)
Sodium: 138 mmol/L (ref 135–145)
Total Bilirubin: 0.5 mg/dL (ref 0.3–1.2)
Total Protein: 6.1 g/dL — ABNORMAL LOW (ref 6.5–8.1)

## 2020-02-12 LAB — D-DIMER, QUANTITATIVE: D-Dimer, Quant: 1.29 ug/mL-FEU — ABNORMAL HIGH (ref 0.00–0.50)

## 2020-02-12 LAB — GLUCOSE, CAPILLARY
Glucose-Capillary: 212 mg/dL — ABNORMAL HIGH (ref 70–99)
Glucose-Capillary: 196 mg/dL — ABNORMAL HIGH (ref 70–99)

## 2020-02-12 LAB — C-REACTIVE PROTEIN: CRP: 5.5 mg/dL — ABNORMAL HIGH (ref <1.0)

## 2020-02-12 LAB — FERRITIN: Ferritin: 547 ng/mL — ABNORMAL HIGH (ref 11–307)

## 2020-02-12 LAB — MAGNESIUM: Magnesium: 2.6 mg/dL — ABNORMAL HIGH (ref 1.7–2.4)

## 2020-02-12 MED ORDER — INSULIN DETEMIR 100 UNIT/ML ~~LOC~~ SOLN: 80.0000 [IU] | Freq: Every day | SUBCUTANEOUS | 2 refills | Status: DC

## 2020-02-12 MED ORDER — ASCORBIC ACID 500 MG PO TABS
500.0000 mg | ORAL_TABLET | Freq: Every day | ORAL | 1 refills | Status: DC
Start: 2020-02-13 — End: 2020-09-29

## 2020-02-12 MED ORDER — ISOSORBIDE MONONITRATE ER 60 MG PO TB24: 60.0000 mg | ORAL_TABLET | Freq: Every day | ORAL | Status: DC

## 2020-02-12 MED ORDER — ALBUTEROL SULFATE HFA 108 (90 BASE) MCG/ACT IN AERS: 2.0000 | INHALATION_SPRAY | Freq: Four times a day (QID) | RESPIRATORY_TRACT | 0 refills | Status: DC | PRN

## 2020-02-12 MED ORDER — DEXAMETHASONE 6 MG PO TABS: 6.0000 mg | ORAL_TABLET | Freq: Every day | ORAL | 0 refills | Status: DC

## 2020-02-12 MED ORDER — GUAIFENESIN-DM 100-10 MG/5ML PO SYRP: 10.0000 mL | ORAL_SOLUTION | ORAL | 0 refills | Status: DC | PRN

## 2020-02-12 MED ORDER — ZINC SULFATE 220 (50 ZN) MG PO CAPS
220.0000 mg | ORAL_CAPSULE | Freq: Every day | ORAL | 0 refills | Status: DC
Start: 2020-02-13 — End: 2020-09-29

## 2020-02-12 MED ORDER — TORSEMIDE 20 MG PO TABS
20.0000 mg | ORAL_TABLET | Freq: Every day | ORAL | 3 refills | Status: DC
Start: 2020-02-12 — End: 2020-10-11

## 2020-02-12 NOTE — Discharge Summary (Addendum)
[name], is a 85 y.o. female  DOB [birth year]  MRN [id number].  Admission date:  02/08/2020  Admitting Physician  [place], DO  Discharge Date:  02/12/2020   Primary MD  [name], MD  Recommendations for primary care physician for things to follow:   1)Avoid ibuprofen/Advil/Aleve/Motrin/Goody Powders/Naproxen/BC powders/Meloxicam/Diclofenac/Indomethacin and other Nonsteroidal anti-inflammatory medications as these will make you more likely to bleed and can cause stomach ulcers, can also cause Kidney problems.   2) okay to use Decadron/steroids and albuterol as prescribed  3) your insulin dosage and frequency may have to be adjusted as your blood sugars/glucose will run higher while you are on steroids  Admission Diagnosis  Acute hypoxemic respiratory failure due to COVID-19 ([place]) [U07.1, J96.01] COVID-19 [U07.1]   Discharge Diagnosis  Acute hypoxemic respiratory failure due to COVID-19 ([place]) [U07.1, J96.01] COVID-19 [U07.1]    Principal Problem:   Pneumonia due to COVID-19 virus Active Problems:   Atrial fibrillation, chronic (HCC)   Acute hypoxemic respiratory failure due to COVID-19 [facility])   Uncontrolled type 2 diabetes mellitus with diabetic nephropathy, with long-term current use of insulin (HCC)   Diabetes mellitus type 2 with complications (HCC)   Chronic diastolic CHF (congestive heart failure) (HCC)/EF > 60 %   GERD (gastroesophageal reflux disease)   CKD (chronic kidney disease), stage IIIb   Essential hypertension      Past Medical History:  Diagnosis Date  . Arthritis   . Chronic diastolic heart failure ([place])   . CKD (chronic kidney disease) stage 3, GFR 30-59 ml/min   . Essential hypertension   . GERD (gastroesophageal reflux disease)   . Hemorrhoids   . History of stroke   . Type 2 diabetes mellitus ([place])     Past Surgical History:  Procedure Laterality Date    . BACK SURGERY    . CATARACT EXTRACTION W/PHACO Left 02/28/2013   Procedure: CATARACT EXTRACTION PHACO AND INTRAOCULAR LENS PLACEMENT (IOL) CDE=15.60;  Surgeon: Elta Guadeloupe T. Gershon Crane, MD;  Location: [facility] ORS;  Service: Ophthalmology;  Laterality: Left;  . CHOLECYSTECTOMY    . COLONOSCOPY  12/12/2003   RMR: Normal rectum.  Long capacious tortuous colon, colonic mucosa appeared normal  . COLONOSCOPY N/A 03/21/2014   Dr. [name]: internal and external hemorrhoids, torturous colon, colonic diverticulosis   . ESOPHAGOGASTRODUODENOSCOPY  12/28/2001   VZC:HYIFO sliding hiatal hernia/. Three small bulbar ulcers, two with stigmata of bleeding and these were coagulated using heater probe unit   . ESOPHAGOGASTRODUODENOSCOPY N/A 03/21/2014   Dr. [name]: cervical esophageal web s/p dilation, negative H.pylori   . EYE SURGERY    . HIP FRACTURE SURGERY  2008  . JOINT REPLACEMENT     Rt hip, ????? sounds like just for fracture  . MALONEY DILATION N/A 03/21/2014   Procedure: [name] DILATION;  Surgeon: [name], MD;  Location: [facility] ENDO SUITE;  Service: Endoscopy;  Laterality: N/A;  . SAVORY DILATION N/A 03/21/2014   Procedure: SAVORY DILATION;  Surgeon: [name], MD;  Location: [facility] ENDO  SUITE;  Service: Endoscopy;  Laterality: N/A;     HPI  from the history and physical done on the day of admission:   Patient coming from: Home  Chief Complaint: Weakness  HPI: [name] is a 85 y.o. female with medical history significant for hypertension, type 2 diabetes, CKD stage IIIb, diastolic congestive heart failure grade 2, paroxysmal atrial fibrillation on Eliquis with chronic LBBB, and GERD who presented to the emergency department with worsening weakness over the last 2 weeks and general unwellness.  She has developed a mild productive cough and has had some episodes of nausea and vomiting x2 earlier this morning.  She continues to remain somewhat nauseated, but denies any abdominal pain or diarrhea.  She  denies any fevers or chills at home.  She also denies any significant chest pain, shortness of breath, dysuria, or any other symptomatic concerns.  She denies any exposure to sick contacts and states that she has not been vaccinated for Covid.  She did take Zofran earlier this morning with no improvement in symptoms.   ED Course: Vital signs are stable, but patient is mildly febrile with temperature 100.7 F.  BUN is 35 and creatinine is 2 which is relatively consistent with her CKD stage IIIb.  Blood glucose is 216 and lactic acid is 1.5.  Chest x-ray with chronic CHF findings and no pneumonia noted.  She is positive for Covid and is currently requiring 1.5 L nasal cannula oxygen.  She is not currently with any dyspnea or tachypnea.  She has received some Decadron in the ED.  Review of Systems: All others reviewed and otherwise negative.     Hospital Course:     Brief Summary:- 85 y.o.femalewith medical history significant forhypertension, type 2 diabetes, CKD stage IIIb,diastolic congestive heart failure grade 2, paroxysmal atrial fibrillation on Eliquis with chronic LBBB, and GERD admitted on 02/08/2020 with acute hypoxic respiratory failure secondary to Covid pneumonia -Hypoxia resolved, patient was weaned off oxygen  A/p 1)Acute hypoxic respiratory failure secondary to COVID-19 infection/Pneumonia--- The treatment plan and use of medications for treatment of COVID-19 infectionand possible side effects were discussed with patient/family -----Patient/Family verbalizes understanding and agrees to treatment protocols  --Patient is positive for COVID-19 infection, chest x-ray with findings of infiltrates/opacities,  patient was tachypneic/hypoxic and requiring continuous supplemental oxygen---patient did meet criteria for initiation of Remdesivir AND Steroid therapy per protocol  ----Attempt to maintain euvolemic state --Zinc and vitamin C as ordered -Albuterol inhaler/bronchodilators as  ordered -Accu-Cheks/fingersticks while on high-dose steroids -Enhanced dosage of anticoagulant for DVT prophylaxis given hypercoagulable state with COVID-19 infections---patient is on apixaban Patient completed IV remdesivir and iv steroids -Being discharged on p.o. Decadron  2) Generalized weakness and deconditioning--- PT eval appreciated patient requires assist of 2, patient with some dyspnea, tachycardia and tachypnea with activity - SNF rehab recommended  3) AKI superimposed on CKD IIIb- -- renally adjust medications, avoid nephrotoxic agents / dehydration  / hypotension -Creatinine currently stable around 2  4)PAFIB--- continue Eliquis for stroke prophylaxis -No AV nodal blocking agent at this time, rate control appears adequate, actually appears sinus  5)DM2-continue insulin regimen , anticipate worsening glycemic control with steroids Use Novolog/Humalog Sliding scale insulin with Accu-Cheks/Fingersticks as ordered   6)HTN--currently on PTA hydralazine and isosorbide, torsemide  changed to daily rather than twice daily  7)HFpEF--acute on chronic diastolic dysfunction CHF, EF from February 2021 with grade 2 diastolic dysfunction and preserved EF of 60 to 65% -Appears compensated at this time,  Disposition: The patient is from: Home  Anticipated d/c is to: SNF    Code Status : full   Family Communication:   Discussed with daughter and pt's sister with patient's permission  Consults  :  na   Discharge Condition: stable  Follow UP--pcp   Diet and Activity recommendation:  As advised  Discharge Instructions    Discharge Instructions    Call MD for:  difficulty breathing, headache or visual disturbances   Complete by: As directed    Call MD for:  extreme fatigue   Complete by: As directed    Call MD for:  persistant dizziness or light-headedness   Complete by: As directed    Call MD for:  persistant nausea and vomiting   Complete  by: As directed    Call MD for:  severe uncontrolled pain   Complete by: As directed    Call MD for:  temperature >100.4   Complete by: As directed    Diet - low sodium heart healthy   Complete by: As directed    Diet Carb Modified   Complete by: As directed    Discharge instructions   Complete by: As directed    1)Avoid ibuprofen/Advil/Aleve/Motrin/Goody Powders/Naproxen/BC powders/Meloxicam/Diclofenac/Indomethacin and other Nonsteroidal anti-inflammatory medications as these will make you more likely to bleed and can cause stomach ulcers, can also cause Kidney problems.   2) okay to use Decadron/steroids and albuterol as prescribed  3) your insulin dosage and frequency may have to be adjusted as your blood sugars/glucose will run higher while you are on steroids   Increase activity slowly   Complete by: As directed        Discharge Medications     Allergies as of 02/12/2020   No Known Allergies     Medication List    TAKE these medications   acetaminophen 325 MG tablet Commonly known as: TYLENOL Take 2 tablets (650 mg total) by mouth every 6 (six) hours as needed for mild pain, fever or headache (or Fever >/= 101).   albuterol 108 (90 Base) MCG/ACT inhaler Commonly known as: VENTOLIN HFA Inhale 2 puffs into the lungs every 6 (six) hours as needed for wheezing or shortness of breath.   apixaban 2.5 MG Tabs tablet Commonly known as: ELIQUIS Take 1 tablet (2.5 mg total) by mouth 2 (two) times daily.   ascorbic acid 500 MG tablet Commonly known as: VITAMIN C Take 1 tablet (500 mg total) by mouth daily. Start taking on: February 13, 2020   dexamethasone 6 MG tablet Commonly known as: DECADRON Take 1 tablet (6 mg total) by mouth daily with breakfast.   famotidine 20 MG tablet Commonly known as: PEPCID Take 1 tablet (20 mg total) by mouth daily.   gabapentin 300 MG capsule Commonly known as: NEURONTIN Take 300 mg by mouth 2 (two) times daily.     guaiFENesin-dextromethorphan 100-10 MG/5ML syrup Commonly known as: ROBITUSSIN DM Take 10 mLs by mouth every 4 (four) hours as needed for cough.   hydrALAZINE 50 MG tablet Commonly known as: APRESOLINE Take 1.5 tablets (75 mg total) by mouth 2 (two) times daily.   hydrocortisone 2.5 % rectal cream Commonly known as: ANUSOL-HC Place 1 application rectally 2 (two) times daily as needed for hemorrhoids or anal itching.   insulin aspart 100 UNIT/ML FlexPen Commonly known as: NOVOLOG Inject 0-10 Units into the skin 3 (three) times daily with meals. insulin aspart (novoLOG) injection 0-10 Units 0-10 Units Subcutaneous, 3 times daily with meals CBG <  70: Implement Hypoglycemia Standing Orders and refer to Hypoglycemia Standing Orders sidebar report  CBG 70 - 120: 0 unit CBG 121 - 150: 0 unit  CBG 151 - 200: 1 unit CBG 201 - 250: 2 units CBG 251 - 300: 4 units CBG 301 - 350: 6 units  CBG 351 - 400: 8 units  CBG > 400: 10 units   insulin detemir 100 UNIT/ML injection Commonly known as: LEVEMIR Inject 0.8 mLs (80 Units total) into the skin at bedtime.   isosorbide mononitrate 60 MG 24 hr tablet Commonly known as: IMDUR Take 1 tablet (60 mg total) by mouth daily.   ondansetron 4 MG tablet Commonly known as: ZOFRAN Take 4 mg by mouth every 6 (six) hours as needed for nausea or vomiting.   simvastatin 20 MG tablet Commonly known as: ZOCOR Take 20 mg by mouth at bedtime.   torsemide 20 MG tablet Commonly known as: DEMADEX Take 1 tablet (20 mg total) by mouth daily. What changed: when to take this   zinc sulfate 220 (50 Zn) MG capsule Take 1 capsule (220 mg total) by mouth daily. Start taking on: February 13, 2020       Major procedures and Radiology Reports - PLEASE review detailed and final reports for all details, in brief -   DG CHEST PORT 1 VIEW  Result Date: 02/10/2020 CLINICAL DATA:  Cough and weakness for 2 weeks EXAM: PORTABLE CHEST 1 VIEW COMPARISON:  Two days ago  FINDINGS: Patchy bilateral infiltrate with airspace disease most notable in the peripheral left lung. Prominent vascular markings. Mild cardiomegaly. No visible effusion or pneumothorax. IMPRESSION: Patchy bilateral infiltrate which is progressed. Atypical pneumonia is favored but there may also be failure. Electronically Signed   By: Monte Fantasia M.D.   On: 02/10/2020 08:42   DG Chest Portable 1 View  Result Date: 02/08/2020 CLINICAL DATA:  Fever and cough for 2 weeks.  COVID status pending. EXAM: PORTABLE CHEST 1 VIEW COMPARISON:  January 01, 2020 FINDINGS: The mediastinal contour and cardiac silhouette are stable. The heart size is enlarged. Increased pulmonary interstitium is identified bilaterally unchanged. No focal pneumonia or pleural effusion is identified. IMPRESSION: Chronic congestive heart failure.  No focal pneumonia. Electronically Signed   By: Abelardo Diesel M.D.   On: 02/08/2020 10:32   Micro Results   Recent Results (from the past 240 hour(s))  Blood culture (routine x 2)     Status: None (Preliminary result)   Collection Time: 02/08/20 10:04 AM   Specimen: BLOOD  Result Value Ref Range Status   Specimen Description BLOOD LEFT ANTECUBITAL  Final   Special Requests   Final    BOTTLES DRAWN AEROBIC AND ANAEROBIC Blood Culture adequate volume   Culture   Final    NO GROWTH 4 DAYS Performed at [facility], [address], [place], [place] [address]    Report Status PENDING  Incomplete  Urine culture     Status: Abnormal   Collection Time: 02/08/20 10:04 AM   Specimen: Urine, Random  Result Value Ref Range Status   Specimen Description   Final    URINE, RANDOM Performed at [facility], [address] [place], [place] [address]    Special Requests   Final    NONE Performed at [facility], [address], [place], [place] [address]    Culture MULTIPLE SPECIES PRESENT, SUGGEST RECOLLECTION (A)  Final   Report Status 02/10/2020 FINAL  Final  Blood culture (routine x 2)  Status: None (Preliminary result)   Collection Time: 02/08/20 10:32 AM   Specimen: BLOOD RIGHT HAND  Result Value Ref Range Status   Specimen Description BLOOD RIGHT HAND  Final   Special Requests   Final    BOTTLES DRAWN AEROBIC AND ANAEROBIC Blood Culture results may not be optimal due to an inadequate volume of blood received in culture bottles   Culture   Final    NO GROWTH 4 DAYS Performed at [facility], [address], [place], [place] [address]    Report Status PENDING  Incomplete  Respiratory Panel by RT PCR (Flu A&B, Covid) - Nasopharyngeal Swab     Status: Abnormal   Collection Time: 02/08/20 11:00 AM   Specimen: Nasopharyngeal Swab  Result Value Ref Range Status   SARS Coronavirus 2 by RT PCR POSITIVE (A) NEGATIVE Final    Comment: RESULT CALLED TO, READ BACK BY AND VERIFIED WITH: FARMER EBONY @1300  ON 02/08/20 BY TJ (NOTE) SARS-CoV-2 target nucleic acids are DETECTED.  SARS-CoV-2 RNA is generally detectable in upper respiratory specimens  during the acute phase of infection. Positive results are indicative of the presence of the identified virus, but do not rule out bacterial infection or co-infection with other pathogens not detected by the test. Clinical correlation with patient history and other diagnostic information is necessary to determine patient infection status. The expected result is Negative.  Fact Sheet for Patients:  PinkCheek.be  Fact Sheet for Healthcare Providers: GravelBags.it  This test is not yet approved or cleared by the [place] FDA and  has been authorized for detection and/or diagnosis of SARS-CoV-2 by FDA under an Emergency Use Authorization (EUA).  This EUA will remain in effect (meaning this test can be  used) for the duration of  the COVID-19 declaration under Section 564(b)(1) of the Act, 21 U.S.C. section 360bbb-3(b)(1), unless the authorization is terminated or revoked  sooner.      Influenza A by PCR NEGATIVE NEGATIVE Final   Influenza B by PCR NEGATIVE NEGATIVE Final    Comment: (NOTE) The Xpert Xpress SARS-CoV-2/FLU/RSV assay is intended as an aid in  the diagnosis of influenza from Nasopharyngeal swab specimens and  should not be used as a sole basis for treatment. Nasal washings and  aspirates are unacceptable for Xpert Xpress SARS-CoV-2/FLU/RSV  testing.  Fact Sheet for Patients: PinkCheek.be  Fact Sheet for Healthcare Providers: GravelBags.it  This test is not yet approved or cleared by the [place] FDA and  has been authorized for detection and/or diagnosis of SARS-CoV-2 by  FDA under an Emergency Use Authorization (EUA). This EUA will remain  in effect (meaning this test can be used) for the duration of the  Covid-19 declaration under Section 564(b)(1) of the Act, 21  U.S.C. section 360bbb-3(b)(1), unless the authorization is  terminated or revoked. Performed at [facility], [address] [place], [place] [address]    Today   Subjective    [name] today has no new concerns -Hypoxia has resolved -Generalized weakness and fatigue persist          Patient has been seen and examined prior to discharge   Objective   Blood pressure (!) 145/59, pulse 80, temperature 98.6 F (37 C), temperature source Oral, resp. rate 18, height 5\' 8"  (1.727 m), weight 100 kg, SpO2 93 %.   Intake/Output Summary (Last 24 hours) at 02/12/2020 1412 Last data filed at 02/12/2020 1000 Gross per 24 hour  Intake 120 ml  Output 1300 ml  Net -1180 ml    Exam Gen:- Awake Alert, no acute distress  HEENT:- [place].AT, No sclera icterus Neck-Supple Neck,No JVD,.  Lungs-improving air movement, no wheezing  CV- S1, S2 normal, regular Abd-  +ve B.Sounds, Abd Soft, No tenderness,    Extremity/Skin:- No  edema,   good pulses Psych-affect is appropriate, oriented x3 Neuro-generalized weakness,  no new focal deficits, no tremors    Data Review   CBC w Diff:  Lab Results  Component Value Date   WBC 4.8 02/12/2020   HGB 9.8 (L) 02/12/2020   HCT 30.8 (L) 02/12/2020   PLT 137 (L) 02/12/2020   LYMPHOPCT 33 02/12/2020   BANDSPCT 0 02/12/2020   MONOPCT 4 02/12/2020   EOSPCT 0 02/12/2020   BASOPCT 0 02/12/2020    CMP:  Lab Results  Component Value Date   NA 138 02/12/2020   K 3.9 02/12/2020   CL 105 02/12/2020   CO2 24 02/12/2020   BUN 60 (H) 02/12/2020   CREATININE 2.06 (H) 02/12/2020   PROT 6.1 (L) 02/12/2020   ALBUMIN 2.3 (L) 02/12/2020   BILITOT 0.5 02/12/2020   ALKPHOS 36 (L) 02/12/2020   AST 25 02/12/2020   ALT 20 02/12/2020  .   Total Discharge time is about 33 minutes  Roxan Hockey M.D on 02/12/2020 at 2:12 PM  Go to [email or web address] -  for contact info  [facility] - Office  [phone]

## 2020-02-12 NOTE — Plan of Care (Signed)
  Problem: Education: Goal: Knowledge of General Education information will improve Description: Including pain rating scale, medication(s)/side effects and non-pharmacologic comfort measures 02/12/2020 1505 by Cameron Ali, RN Outcome: Completed/Met 02/12/2020 0907 by Cameron Ali, RN Outcome: Progressing   Problem: Health Behavior/Discharge Planning: Goal: Ability to manage health-related needs will improve 02/12/2020 1505 by Cameron Ali, RN Outcome: Completed/Met 02/12/2020 0907 by Cameron Ali, RN Outcome: Progressing   Problem: Clinical Measurements: Goal: Ability to maintain clinical measurements within normal limits will improve 02/12/2020 1505 by Cameron Ali, RN Outcome: Completed/Met 02/12/2020 0907 by Cameron Ali, RN Outcome: Progressing Goal: Will remain free from infection 02/12/2020 1505 by Cameron Ali, RN Outcome: Completed/Met 02/12/2020 0907 by Cameron Ali, RN Outcome: Progressing Goal: Diagnostic test results will improve 02/12/2020 1505 by Cameron Ali, RN Outcome: Completed/Met 02/12/2020 0907 by Cameron Ali, RN Outcome: Progressing Goal: Respiratory complications will improve 02/12/2020 1505 by Cameron Ali, RN Outcome: Completed/Met 02/12/2020 0907 by Cameron Ali, RN Outcome: Progressing Goal: Cardiovascular complication will be avoided 02/12/2020 1505 by Cameron Ali, RN Outcome: Completed/Met 02/12/2020 0907 by Cameron Ali, RN Outcome: Progressing   Problem: Activity: Goal: Risk for activity intolerance will decrease 02/12/2020 1505 by Cameron Ali, RN Outcome: Completed/Met 02/12/2020 0907 by Cameron Ali, RN Outcome: Progressing   Problem: Nutrition: Goal: Adequate nutrition will be maintained 02/12/2020 1505 by Cameron Ali, RN Outcome: Completed/Met 02/12/2020 0907 by Cameron Ali, RN Outcome: Progressing   Problem: Coping: Goal: Level of anxiety will  decrease 02/12/2020 1505 by Cameron Ali, RN Outcome: Completed/Met 02/12/2020 0907 by Cameron Ali, RN Outcome: Progressing   Problem: Elimination: Goal: Will not experience complications related to bowel motility 02/12/2020 1505 by Cameron Ali, RN Outcome: Completed/Met 02/12/2020 0907 by Cameron Ali, RN Outcome: Progressing Goal: Will not experience complications related to urinary retention 02/12/2020 1505 by Cameron Ali, RN Outcome: Completed/Met 02/12/2020 0907 by Cameron Ali, RN Outcome: Progressing   Problem: Pain Managment: Goal: General experience of comfort will improve 02/12/2020 1505 by Cameron Ali, RN Outcome: Completed/Met 02/12/2020 0907 by Cameron Ali, RN Outcome: Progressing   Problem: Safety: Goal: Ability to remain free from injury will improve 02/12/2020 1505 by Cameron Ali, RN Outcome: Completed/Met 02/12/2020 0907 by Cameron Ali, RN Outcome: Progressing   Problem: Skin Integrity: Goal: Risk for impaired skin integrity will decrease 02/12/2020 1505 by Cameron Ali, RN Outcome: Completed/Met 02/12/2020 0907 by Cameron Ali, RN Outcome: Progressing   Problem: Education: Goal: Knowledge of risk factors and measures for prevention of condition will improve 02/12/2020 1505 by Cameron Ali, RN Outcome: Completed/Met 02/12/2020 0907 by Cameron Ali, RN Outcome: Progressing   Problem: Coping: Goal: Psychosocial and spiritual needs will be supported 02/12/2020 1505 by Cameron Ali, RN Outcome: Completed/Met 02/12/2020 0907 by Cameron Ali, RN Outcome: Progressing   Problem: Respiratory: Goal: Will maintain a patent airway 02/12/2020 1505 by Cameron Ali, RN Outcome: Completed/Met 02/12/2020 0907 by Cameron Ali, RN Outcome: Progressing Goal: Complications related to the disease process, condition or treatment will be avoided or minimized 02/12/2020 1505 by Cameron Ali, RN Outcome: Completed/Met 02/12/2020 0907 by Cameron Ali, RN Outcome: Progressing

## 2020-02-12 NOTE — Plan of Care (Signed)

## 2020-02-12 NOTE — TOC Transition Note (Addendum)
Transition of Care [facility]) - CM/SW Discharge Note   Patient Details  Name: [name] MRN: [id number] Date of Birth: [birth year]  [facility] CM/SW Contact:  [name], [place] Phone Number: 02/12/2020, 2:26 PM   Clinical Narrative:  Pt d/c today to SNF. LCSW provided bed offer at [facility] to pt's daughter, [name] who accepts. Pt also aware and agreeable. Pt will transport via [facility]. RN notified to call report to [phone]. Pt is going to room 804A. Per Juliann Pulse at facility, SNF [facility] has been waived for [facility] at this time.      Final next level of care: Skilled Nursing Facility Barriers to Discharge: Barriers Resolved   Patient Goals and CMS Choice Patient states their goals for this hospitalization and ongoing recovery are:: SNF      Discharge Placement              Patient chooses bed at: [facility] Patient to be transferred to facility by: [facility] Name of family member notified: [name]- daughter Patient and family notified of of transfer: 02/12/20  Discharge Plan and Services In-house Referral: Clinical Social Work                                   Social Determinants of Health ([place]) Interventions     Readmission Risk Interventions Readmission Risk Prevention Plan 02/09/2020 07/27/2019  Transportation Screening Complete Complete  Medication Review (RN CM) - Complete  HRI or Home Care Consult Complete -  Social Work Consult for [place] Planning/Counseling Complete -  Palliative Care Screening Not Applicable -  Medication Review Press photographer) Complete -  Some recent data might be hidden

## 2020-02-12 NOTE — Discharge Instructions (Signed)
1)Avoid ibuprofen/Advil/Aleve/Motrin/Goody Powders/Naproxen/BC powders/Meloxicam/Diclofenac/Indomethacin and other Nonsteroidal anti-inflammatory medications as these will make you more likely to bleed and can cause stomach ulcers, can also cause Kidney problems.   2) okay to use Decadron/steroids and albuterol as prescribed  3) your insulin dosage and frequency may have to be adjusted as your blood sugars/glucose will run higher while you are on steroids

## 2020-02-12 NOTE — Progress Notes (Signed)
Report called to nurse [place] at [phone]. All belongings sent with patient.

## 2020-02-13 ENCOUNTER — Other Ambulatory Visit: Payer: Self-pay

## 2020-02-13 DIAGNOSIS — M1830 Unilateral post-traumatic osteoarthritis of first carpometacarpal joint, unspecified hand: Secondary | ICD-10-CM | POA: Diagnosis not present

## 2020-02-13 DIAGNOSIS — R2681 Unsteadiness on feet: Secondary | ICD-10-CM | POA: Diagnosis not present

## 2020-02-13 DIAGNOSIS — I10 Essential (primary) hypertension: Secondary | ICD-10-CM | POA: Diagnosis not present

## 2020-02-13 DIAGNOSIS — M6281 Muscle weakness (generalized): Secondary | ICD-10-CM | POA: Diagnosis not present

## 2020-02-13 DIAGNOSIS — E782 Mixed hyperlipidemia: Secondary | ICD-10-CM | POA: Diagnosis not present

## 2020-02-13 DIAGNOSIS — E119 Type 2 diabetes mellitus without complications: Secondary | ICD-10-CM | POA: Diagnosis not present

## 2020-02-13 DIAGNOSIS — I4891 Unspecified atrial fibrillation: Secondary | ICD-10-CM | POA: Diagnosis not present

## 2020-02-13 DIAGNOSIS — B9732 Oncovirus as the cause of diseases classified elsewhere: Secondary | ICD-10-CM | POA: Diagnosis not present

## 2020-02-13 DIAGNOSIS — I509 Heart failure, unspecified: Secondary | ICD-10-CM | POA: Diagnosis not present

## 2020-02-13 NOTE — Patient Outreach (Signed)
[facility]) Care Management  02/13/2020  [name] Sep 11, 1934 [id number]     Transition of Care Referral  Referral Date: 02/13/2020 Referral Source: [facility] Discharge Report Date of Discharge: 02/12/2020 Facility: [place]: [facility]   Referral received. No outreach warranted at this time. Per EMR patient discharged to rehab/SNF.     Plan: RN CM will close case at this time.    Enzo Montgomery, RN,BSN,CCM [place] Management Telephonic Care Management Coordinator Direct Phone: [phone] Toll Free: [phone] Fax: [phone]

## 2020-02-14 LAB — CULTURE, BLOOD (ROUTINE X 2)
Culture: NO GROWTH
Culture: NO GROWTH
Special Requests: ADEQUATE

## 2020-02-15 DIAGNOSIS — I10 Essential (primary) hypertension: Secondary | ICD-10-CM | POA: Diagnosis not present

## 2020-02-15 DIAGNOSIS — E782 Mixed hyperlipidemia: Secondary | ICD-10-CM | POA: Diagnosis not present

## 2020-02-15 DIAGNOSIS — E119 Type 2 diabetes mellitus without complications: Secondary | ICD-10-CM | POA: Diagnosis not present

## 2020-02-15 DIAGNOSIS — M6281 Muscle weakness (generalized): Secondary | ICD-10-CM | POA: Diagnosis not present

## 2020-02-15 DIAGNOSIS — B9732 Oncovirus as the cause of diseases classified elsewhere: Secondary | ICD-10-CM | POA: Diagnosis not present

## 2020-02-15 DIAGNOSIS — I509 Heart failure, unspecified: Secondary | ICD-10-CM | POA: Diagnosis not present

## 2020-02-15 DIAGNOSIS — R2681 Unsteadiness on feet: Secondary | ICD-10-CM | POA: Diagnosis not present

## 2020-02-15 DIAGNOSIS — N183 Chronic kidney disease, stage 3 unspecified: Secondary | ICD-10-CM | POA: Diagnosis not present

## 2020-02-15 DIAGNOSIS — I4891 Unspecified atrial fibrillation: Secondary | ICD-10-CM | POA: Diagnosis not present

## 2020-02-19 DIAGNOSIS — B9732 Oncovirus as the cause of diseases classified elsewhere: Secondary | ICD-10-CM | POA: Diagnosis not present

## 2020-02-19 DIAGNOSIS — E119 Type 2 diabetes mellitus without complications: Secondary | ICD-10-CM | POA: Diagnosis not present

## 2020-02-19 DIAGNOSIS — M6281 Muscle weakness (generalized): Secondary | ICD-10-CM | POA: Diagnosis not present

## 2020-02-19 DIAGNOSIS — I4891 Unspecified atrial fibrillation: Secondary | ICD-10-CM | POA: Diagnosis not present

## 2020-02-19 DIAGNOSIS — E782 Mixed hyperlipidemia: Secondary | ICD-10-CM | POA: Diagnosis not present

## 2020-02-19 DIAGNOSIS — N183 Chronic kidney disease, stage 3 unspecified: Secondary | ICD-10-CM | POA: Diagnosis not present

## 2020-02-19 DIAGNOSIS — R2681 Unsteadiness on feet: Secondary | ICD-10-CM | POA: Diagnosis not present

## 2020-02-19 DIAGNOSIS — I509 Heart failure, unspecified: Secondary | ICD-10-CM | POA: Diagnosis not present

## 2020-02-19 DIAGNOSIS — I10 Essential (primary) hypertension: Secondary | ICD-10-CM | POA: Diagnosis not present

## 2020-02-20 DIAGNOSIS — J1282 Pneumonia due to coronavirus disease 2019: Secondary | ICD-10-CM | POA: Diagnosis not present

## 2020-02-20 DIAGNOSIS — U071 COVID-19: Secondary | ICD-10-CM | POA: Diagnosis not present

## 2020-02-20 DIAGNOSIS — R531 Weakness: Secondary | ICD-10-CM | POA: Diagnosis not present

## 2020-02-20 DIAGNOSIS — M6259 Muscle wasting and atrophy, not elsewhere classified, multiple sites: Secondary | ICD-10-CM | POA: Diagnosis not present

## 2020-02-21 DIAGNOSIS — E782 Mixed hyperlipidemia: Secondary | ICD-10-CM | POA: Diagnosis not present

## 2020-02-21 DIAGNOSIS — R531 Weakness: Secondary | ICD-10-CM | POA: Diagnosis not present

## 2020-02-21 DIAGNOSIS — I10 Essential (primary) hypertension: Secondary | ICD-10-CM | POA: Diagnosis not present

## 2020-02-21 DIAGNOSIS — J9601 Acute respiratory failure with hypoxia: Secondary | ICD-10-CM | POA: Diagnosis not present

## 2020-02-21 DIAGNOSIS — E119 Type 2 diabetes mellitus without complications: Secondary | ICD-10-CM | POA: Diagnosis not present

## 2020-02-21 DIAGNOSIS — M6259 Muscle wasting and atrophy, not elsewhere classified, multiple sites: Secondary | ICD-10-CM | POA: Diagnosis not present

## 2020-02-21 DIAGNOSIS — R2681 Unsteadiness on feet: Secondary | ICD-10-CM | POA: Diagnosis not present

## 2020-02-21 DIAGNOSIS — I509 Heart failure, unspecified: Secondary | ICD-10-CM | POA: Diagnosis not present

## 2020-02-21 DIAGNOSIS — E785 Hyperlipidemia, unspecified: Secondary | ICD-10-CM | POA: Diagnosis not present

## 2020-02-21 DIAGNOSIS — B9732 Oncovirus as the cause of diseases classified elsewhere: Secondary | ICD-10-CM | POA: Diagnosis not present

## 2020-02-21 DIAGNOSIS — I4891 Unspecified atrial fibrillation: Secondary | ICD-10-CM | POA: Diagnosis not present

## 2020-02-21 DIAGNOSIS — N183 Chronic kidney disease, stage 3 unspecified: Secondary | ICD-10-CM | POA: Diagnosis not present

## 2020-02-21 DIAGNOSIS — M6281 Muscle weakness (generalized): Secondary | ICD-10-CM | POA: Diagnosis not present

## 2020-02-23 ENCOUNTER — Other Ambulatory Visit: Payer: Self-pay

## 2020-02-23 NOTE — Patient Outreach (Signed)
[facility]) Care Management  02/23/2020  [name] 09-Nov-1934 [id number]   Referral Date: 02/23/20 Referral Source: [employer] Report Date of Discharge: 02/22/20 Facility:  [place]: [facility]   Referral received.  No outreach warranted at this time.  Transition of Care calls being completed via EMMI. RN CM will outreach patient for any red flags received.    Plan: RN CM will close case.    [name], RN, MSN [facility] Care Management Care Management Coordinator Direct Line [phone] Toll Free: [phone]  Fax: [phone]

## 2020-03-02 DIAGNOSIS — I5032 Chronic diastolic (congestive) heart failure: Secondary | ICD-10-CM | POA: Diagnosis not present

## 2020-03-02 DIAGNOSIS — D631 Anemia in chronic kidney disease: Secondary | ICD-10-CM | POA: Diagnosis not present

## 2020-03-02 DIAGNOSIS — J1282 Pneumonia due to coronavirus disease 2019: Secondary | ICD-10-CM | POA: Diagnosis not present

## 2020-03-02 DIAGNOSIS — J9601 Acute respiratory failure with hypoxia: Secondary | ICD-10-CM | POA: Diagnosis not present

## 2020-03-02 DIAGNOSIS — I110 Hypertensive heart disease with heart failure: Secondary | ICD-10-CM | POA: Diagnosis not present

## 2020-03-02 DIAGNOSIS — N1832 Chronic kidney disease, stage 3b: Secondary | ICD-10-CM | POA: Diagnosis not present

## 2020-03-02 DIAGNOSIS — U071 COVID-19: Secondary | ICD-10-CM | POA: Diagnosis not present

## 2020-03-02 DIAGNOSIS — E1122 Type 2 diabetes mellitus with diabetic chronic kidney disease: Secondary | ICD-10-CM | POA: Diagnosis not present

## 2020-03-02 DIAGNOSIS — I480 Paroxysmal atrial fibrillation: Secondary | ICD-10-CM | POA: Diagnosis not present

## 2020-03-04 DIAGNOSIS — E1142 Type 2 diabetes mellitus with diabetic polyneuropathy: Secondary | ICD-10-CM | POA: Diagnosis not present

## 2020-03-04 DIAGNOSIS — I110 Hypertensive heart disease with heart failure: Secondary | ICD-10-CM | POA: Diagnosis not present

## 2020-03-04 DIAGNOSIS — E1122 Type 2 diabetes mellitus with diabetic chronic kidney disease: Secondary | ICD-10-CM | POA: Diagnosis not present

## 2020-03-04 DIAGNOSIS — I480 Paroxysmal atrial fibrillation: Secondary | ICD-10-CM | POA: Diagnosis not present

## 2020-03-04 DIAGNOSIS — U071 COVID-19: Secondary | ICD-10-CM | POA: Diagnosis not present

## 2020-03-04 DIAGNOSIS — J9601 Acute respiratory failure with hypoxia: Secondary | ICD-10-CM | POA: Diagnosis not present

## 2020-03-04 DIAGNOSIS — N1832 Chronic kidney disease, stage 3b: Secondary | ICD-10-CM | POA: Diagnosis not present

## 2020-03-04 DIAGNOSIS — I5032 Chronic diastolic (congestive) heart failure: Secondary | ICD-10-CM | POA: Diagnosis not present

## 2020-03-04 DIAGNOSIS — J1282 Pneumonia due to coronavirus disease 2019: Secondary | ICD-10-CM | POA: Diagnosis not present

## 2020-03-04 DIAGNOSIS — I10 Essential (primary) hypertension: Secondary | ICD-10-CM | POA: Diagnosis not present

## 2020-03-04 DIAGNOSIS — I4821 Permanent atrial fibrillation: Secondary | ICD-10-CM | POA: Diagnosis not present

## 2020-03-04 DIAGNOSIS — D631 Anemia in chronic kidney disease: Secondary | ICD-10-CM | POA: Diagnosis not present

## 2020-03-05 DIAGNOSIS — J9601 Acute respiratory failure with hypoxia: Secondary | ICD-10-CM | POA: Diagnosis not present

## 2020-03-05 DIAGNOSIS — N1832 Chronic kidney disease, stage 3b: Secondary | ICD-10-CM | POA: Diagnosis not present

## 2020-03-05 DIAGNOSIS — U071 COVID-19: Secondary | ICD-10-CM | POA: Diagnosis not present

## 2020-03-05 DIAGNOSIS — I5032 Chronic diastolic (congestive) heart failure: Secondary | ICD-10-CM | POA: Diagnosis not present

## 2020-03-05 DIAGNOSIS — J1282 Pneumonia due to coronavirus disease 2019: Secondary | ICD-10-CM | POA: Diagnosis not present

## 2020-03-05 DIAGNOSIS — I480 Paroxysmal atrial fibrillation: Secondary | ICD-10-CM | POA: Diagnosis not present

## 2020-03-05 DIAGNOSIS — D631 Anemia in chronic kidney disease: Secondary | ICD-10-CM | POA: Diagnosis not present

## 2020-03-05 DIAGNOSIS — I110 Hypertensive heart disease with heart failure: Secondary | ICD-10-CM | POA: Diagnosis not present

## 2020-03-05 DIAGNOSIS — E1122 Type 2 diabetes mellitus with diabetic chronic kidney disease: Secondary | ICD-10-CM | POA: Diagnosis not present

## 2020-03-07 DIAGNOSIS — N1832 Chronic kidney disease, stage 3b: Secondary | ICD-10-CM | POA: Diagnosis not present

## 2020-03-07 DIAGNOSIS — J9601 Acute respiratory failure with hypoxia: Secondary | ICD-10-CM | POA: Diagnosis not present

## 2020-03-07 DIAGNOSIS — I110 Hypertensive heart disease with heart failure: Secondary | ICD-10-CM | POA: Diagnosis not present

## 2020-03-07 DIAGNOSIS — I5032 Chronic diastolic (congestive) heart failure: Secondary | ICD-10-CM | POA: Diagnosis not present

## 2020-03-07 DIAGNOSIS — J1282 Pneumonia due to coronavirus disease 2019: Secondary | ICD-10-CM | POA: Diagnosis not present

## 2020-03-07 DIAGNOSIS — U071 COVID-19: Secondary | ICD-10-CM | POA: Diagnosis not present

## 2020-03-07 DIAGNOSIS — E1122 Type 2 diabetes mellitus with diabetic chronic kidney disease: Secondary | ICD-10-CM | POA: Diagnosis not present

## 2020-03-07 DIAGNOSIS — I480 Paroxysmal atrial fibrillation: Secondary | ICD-10-CM | POA: Diagnosis not present

## 2020-03-07 DIAGNOSIS — D631 Anemia in chronic kidney disease: Secondary | ICD-10-CM | POA: Diagnosis not present

## 2020-03-09 DIAGNOSIS — E162 Hypoglycemia, unspecified: Secondary | ICD-10-CM | POA: Diagnosis not present

## 2020-03-09 DIAGNOSIS — E161 Other hypoglycemia: Secondary | ICD-10-CM | POA: Diagnosis not present

## 2020-03-11 ENCOUNTER — Telehealth: Payer: Self-pay | Admitting: *Deleted

## 2020-03-11 NOTE — Telephone Encounter (Signed)
-----   Message from Verta Ellen., NP sent at 03/11/2020 11:46 AM EDT ----- Regarding: RE: need for monitor As long as she has rate control I guess she is okay not to have the monitor.  I see she is not on AV nodal blockers like Lopressor or Cardizem.  But she is on anticoagulants.  I assume her heart rate is under control.  Thanks ----- Message ----- From: Laurine Blazer, LPN Sent: [id number]/27/5170  10:35 AM EDT To: Verta Ellen., NP Subject: need for monitor                               Patient was in office to see you on 9/10 - monitor was ordered.  She ended up being hospitalized 9/23-9/27 due to covid.  She states her afib was addressed at that visit also.  Please advise if she still needs to wear the 30 day event monitor & when she needs follow up back in office.  Thanks,   Edd Fabian

## 2020-03-11 NOTE — Telephone Encounter (Signed)
Patient notified and verbalized understanding.  States her heart rate has been doing fine.  Monitor order will be cancelled.

## 2020-03-12 DIAGNOSIS — E1122 Type 2 diabetes mellitus with diabetic chronic kidney disease: Secondary | ICD-10-CM | POA: Diagnosis not present

## 2020-03-12 DIAGNOSIS — J9601 Acute respiratory failure with hypoxia: Secondary | ICD-10-CM | POA: Diagnosis not present

## 2020-03-12 DIAGNOSIS — I5032 Chronic diastolic (congestive) heart failure: Secondary | ICD-10-CM | POA: Diagnosis not present

## 2020-03-12 DIAGNOSIS — I110 Hypertensive heart disease with heart failure: Secondary | ICD-10-CM | POA: Diagnosis not present

## 2020-03-12 DIAGNOSIS — I480 Paroxysmal atrial fibrillation: Secondary | ICD-10-CM | POA: Diagnosis not present

## 2020-03-12 DIAGNOSIS — U071 COVID-19: Secondary | ICD-10-CM | POA: Diagnosis not present

## 2020-03-12 DIAGNOSIS — J1282 Pneumonia due to coronavirus disease 2019: Secondary | ICD-10-CM | POA: Diagnosis not present

## 2020-03-12 DIAGNOSIS — D631 Anemia in chronic kidney disease: Secondary | ICD-10-CM | POA: Diagnosis not present

## 2020-03-12 DIAGNOSIS — N1832 Chronic kidney disease, stage 3b: Secondary | ICD-10-CM | POA: Diagnosis not present

## 2020-03-14 DIAGNOSIS — I110 Hypertensive heart disease with heart failure: Secondary | ICD-10-CM | POA: Diagnosis not present

## 2020-03-14 DIAGNOSIS — N1832 Chronic kidney disease, stage 3b: Secondary | ICD-10-CM | POA: Diagnosis not present

## 2020-03-14 DIAGNOSIS — D631 Anemia in chronic kidney disease: Secondary | ICD-10-CM | POA: Diagnosis not present

## 2020-03-14 DIAGNOSIS — J1282 Pneumonia due to coronavirus disease 2019: Secondary | ICD-10-CM | POA: Diagnosis not present

## 2020-03-14 DIAGNOSIS — U071 COVID-19: Secondary | ICD-10-CM | POA: Diagnosis not present

## 2020-03-14 DIAGNOSIS — I5032 Chronic diastolic (congestive) heart failure: Secondary | ICD-10-CM | POA: Diagnosis not present

## 2020-03-14 DIAGNOSIS — J9601 Acute respiratory failure with hypoxia: Secondary | ICD-10-CM | POA: Diagnosis not present

## 2020-03-14 DIAGNOSIS — E1122 Type 2 diabetes mellitus with diabetic chronic kidney disease: Secondary | ICD-10-CM | POA: Diagnosis not present

## 2020-03-14 DIAGNOSIS — I480 Paroxysmal atrial fibrillation: Secondary | ICD-10-CM | POA: Diagnosis not present

## 2020-03-19 DIAGNOSIS — E1122 Type 2 diabetes mellitus with diabetic chronic kidney disease: Secondary | ICD-10-CM | POA: Diagnosis not present

## 2020-03-19 DIAGNOSIS — J810 Acute pulmonary edema: Secondary | ICD-10-CM | POA: Diagnosis not present

## 2020-03-19 DIAGNOSIS — I4891 Unspecified atrial fibrillation: Secondary | ICD-10-CM | POA: Diagnosis not present

## 2020-03-19 DIAGNOSIS — J9601 Acute respiratory failure with hypoxia: Secondary | ICD-10-CM | POA: Diagnosis not present

## 2020-03-19 DIAGNOSIS — N1832 Chronic kidney disease, stage 3b: Secondary | ICD-10-CM | POA: Diagnosis not present

## 2020-03-19 DIAGNOSIS — I480 Paroxysmal atrial fibrillation: Secondary | ICD-10-CM | POA: Diagnosis not present

## 2020-03-19 DIAGNOSIS — J1282 Pneumonia due to coronavirus disease 2019: Secondary | ICD-10-CM | POA: Diagnosis not present

## 2020-03-19 DIAGNOSIS — K859 Acute pancreatitis without necrosis or infection, unspecified: Secondary | ICD-10-CM | POA: Diagnosis not present

## 2020-03-19 DIAGNOSIS — M6281 Muscle weakness (generalized): Secondary | ICD-10-CM | POA: Diagnosis not present

## 2020-03-19 DIAGNOSIS — I110 Hypertensive heart disease with heart failure: Secondary | ICD-10-CM | POA: Diagnosis not present

## 2020-03-19 DIAGNOSIS — I5032 Chronic diastolic (congestive) heart failure: Secondary | ICD-10-CM | POA: Diagnosis not present

## 2020-03-19 DIAGNOSIS — D631 Anemia in chronic kidney disease: Secondary | ICD-10-CM | POA: Diagnosis not present

## 2020-03-19 DIAGNOSIS — U071 COVID-19: Secondary | ICD-10-CM | POA: Diagnosis not present

## 2020-03-20 DIAGNOSIS — U071 COVID-19: Secondary | ICD-10-CM | POA: Diagnosis not present

## 2020-03-20 DIAGNOSIS — J1282 Pneumonia due to coronavirus disease 2019: Secondary | ICD-10-CM | POA: Diagnosis not present

## 2020-03-20 DIAGNOSIS — D631 Anemia in chronic kidney disease: Secondary | ICD-10-CM | POA: Diagnosis not present

## 2020-03-20 DIAGNOSIS — I480 Paroxysmal atrial fibrillation: Secondary | ICD-10-CM | POA: Diagnosis not present

## 2020-03-20 DIAGNOSIS — I5032 Chronic diastolic (congestive) heart failure: Secondary | ICD-10-CM | POA: Diagnosis not present

## 2020-03-20 DIAGNOSIS — J9601 Acute respiratory failure with hypoxia: Secondary | ICD-10-CM | POA: Diagnosis not present

## 2020-03-20 DIAGNOSIS — N1832 Chronic kidney disease, stage 3b: Secondary | ICD-10-CM | POA: Diagnosis not present

## 2020-03-20 DIAGNOSIS — E1122 Type 2 diabetes mellitus with diabetic chronic kidney disease: Secondary | ICD-10-CM | POA: Diagnosis not present

## 2020-03-20 DIAGNOSIS — I110 Hypertensive heart disease with heart failure: Secondary | ICD-10-CM | POA: Diagnosis not present

## 2020-03-27 DIAGNOSIS — E1122 Type 2 diabetes mellitus with diabetic chronic kidney disease: Secondary | ICD-10-CM | POA: Diagnosis not present

## 2020-03-27 DIAGNOSIS — N1832 Chronic kidney disease, stage 3b: Secondary | ICD-10-CM | POA: Diagnosis not present

## 2020-03-27 DIAGNOSIS — J9601 Acute respiratory failure with hypoxia: Secondary | ICD-10-CM | POA: Diagnosis not present

## 2020-03-27 DIAGNOSIS — U071 COVID-19: Secondary | ICD-10-CM | POA: Diagnosis not present

## 2020-03-27 DIAGNOSIS — J1282 Pneumonia due to coronavirus disease 2019: Secondary | ICD-10-CM | POA: Diagnosis not present

## 2020-03-27 DIAGNOSIS — D631 Anemia in chronic kidney disease: Secondary | ICD-10-CM | POA: Diagnosis not present

## 2020-03-27 DIAGNOSIS — I480 Paroxysmal atrial fibrillation: Secondary | ICD-10-CM | POA: Diagnosis not present

## 2020-03-27 DIAGNOSIS — I5032 Chronic diastolic (congestive) heart failure: Secondary | ICD-10-CM | POA: Diagnosis not present

## 2020-03-27 DIAGNOSIS — I110 Hypertensive heart disease with heart failure: Secondary | ICD-10-CM | POA: Diagnosis not present

## 2020-03-28 DIAGNOSIS — I5032 Chronic diastolic (congestive) heart failure: Secondary | ICD-10-CM | POA: Diagnosis not present

## 2020-03-28 DIAGNOSIS — E1122 Type 2 diabetes mellitus with diabetic chronic kidney disease: Secondary | ICD-10-CM | POA: Diagnosis not present

## 2020-03-28 DIAGNOSIS — I480 Paroxysmal atrial fibrillation: Secondary | ICD-10-CM | POA: Diagnosis not present

## 2020-03-28 DIAGNOSIS — J9601 Acute respiratory failure with hypoxia: Secondary | ICD-10-CM | POA: Diagnosis not present

## 2020-03-28 DIAGNOSIS — J1282 Pneumonia due to coronavirus disease 2019: Secondary | ICD-10-CM | POA: Diagnosis not present

## 2020-03-28 DIAGNOSIS — D631 Anemia in chronic kidney disease: Secondary | ICD-10-CM | POA: Diagnosis not present

## 2020-03-28 DIAGNOSIS — N1832 Chronic kidney disease, stage 3b: Secondary | ICD-10-CM | POA: Diagnosis not present

## 2020-03-28 DIAGNOSIS — U071 COVID-19: Secondary | ICD-10-CM | POA: Diagnosis not present

## 2020-03-28 DIAGNOSIS — I110 Hypertensive heart disease with heart failure: Secondary | ICD-10-CM | POA: Diagnosis not present

## 2020-04-01 DIAGNOSIS — N1832 Chronic kidney disease, stage 3b: Secondary | ICD-10-CM | POA: Diagnosis not present

## 2020-04-01 DIAGNOSIS — I5032 Chronic diastolic (congestive) heart failure: Secondary | ICD-10-CM | POA: Diagnosis not present

## 2020-04-01 DIAGNOSIS — I110 Hypertensive heart disease with heart failure: Secondary | ICD-10-CM | POA: Diagnosis not present

## 2020-04-01 DIAGNOSIS — Z0001 Encounter for general adult medical examination with abnormal findings: Secondary | ICD-10-CM | POA: Diagnosis not present

## 2020-04-01 DIAGNOSIS — G8190 Hemiplegia, unspecified affecting unspecified side: Secondary | ICD-10-CM | POA: Diagnosis not present

## 2020-04-01 DIAGNOSIS — J9601 Acute respiratory failure with hypoxia: Secondary | ICD-10-CM | POA: Diagnosis not present

## 2020-04-01 DIAGNOSIS — E1142 Type 2 diabetes mellitus with diabetic polyneuropathy: Secondary | ICD-10-CM | POA: Diagnosis not present

## 2020-04-01 DIAGNOSIS — E1122 Type 2 diabetes mellitus with diabetic chronic kidney disease: Secondary | ICD-10-CM | POA: Diagnosis not present

## 2020-04-01 DIAGNOSIS — U071 COVID-19: Secondary | ICD-10-CM | POA: Diagnosis not present

## 2020-04-01 DIAGNOSIS — J1282 Pneumonia due to coronavirus disease 2019: Secondary | ICD-10-CM | POA: Diagnosis not present

## 2020-04-01 DIAGNOSIS — I480 Paroxysmal atrial fibrillation: Secondary | ICD-10-CM | POA: Diagnosis not present

## 2020-04-01 DIAGNOSIS — Z1331 Encounter for screening for depression: Secondary | ICD-10-CM | POA: Diagnosis not present

## 2020-04-01 DIAGNOSIS — Z1389 Encounter for screening for other disorder: Secondary | ICD-10-CM | POA: Diagnosis not present

## 2020-04-01 DIAGNOSIS — D631 Anemia in chronic kidney disease: Secondary | ICD-10-CM | POA: Diagnosis not present

## 2020-04-02 DIAGNOSIS — E1122 Type 2 diabetes mellitus with diabetic chronic kidney disease: Secondary | ICD-10-CM | POA: Diagnosis not present

## 2020-04-02 DIAGNOSIS — I5032 Chronic diastolic (congestive) heart failure: Secondary | ICD-10-CM | POA: Diagnosis not present

## 2020-04-02 DIAGNOSIS — J9601 Acute respiratory failure with hypoxia: Secondary | ICD-10-CM | POA: Diagnosis not present

## 2020-04-02 DIAGNOSIS — U071 COVID-19: Secondary | ICD-10-CM | POA: Diagnosis not present

## 2020-04-02 DIAGNOSIS — J1282 Pneumonia due to coronavirus disease 2019: Secondary | ICD-10-CM | POA: Diagnosis not present

## 2020-04-02 DIAGNOSIS — I110 Hypertensive heart disease with heart failure: Secondary | ICD-10-CM | POA: Diagnosis not present

## 2020-04-02 DIAGNOSIS — D631 Anemia in chronic kidney disease: Secondary | ICD-10-CM | POA: Diagnosis not present

## 2020-04-02 DIAGNOSIS — N1832 Chronic kidney disease, stage 3b: Secondary | ICD-10-CM | POA: Diagnosis not present

## 2020-04-02 DIAGNOSIS — I480 Paroxysmal atrial fibrillation: Secondary | ICD-10-CM | POA: Diagnosis not present

## 2020-04-05 ENCOUNTER — Ambulatory Visit: Payer: Self-pay | Admitting: Nurse Practitioner

## 2020-04-05 DIAGNOSIS — J9601 Acute respiratory failure with hypoxia: Secondary | ICD-10-CM | POA: Diagnosis not present

## 2020-04-05 DIAGNOSIS — I5032 Chronic diastolic (congestive) heart failure: Secondary | ICD-10-CM | POA: Diagnosis not present

## 2020-04-05 DIAGNOSIS — I110 Hypertensive heart disease with heart failure: Secondary | ICD-10-CM | POA: Diagnosis not present

## 2020-04-05 DIAGNOSIS — E1122 Type 2 diabetes mellitus with diabetic chronic kidney disease: Secondary | ICD-10-CM | POA: Diagnosis not present

## 2020-04-05 DIAGNOSIS — N1832 Chronic kidney disease, stage 3b: Secondary | ICD-10-CM | POA: Diagnosis not present

## 2020-04-05 DIAGNOSIS — I480 Paroxysmal atrial fibrillation: Secondary | ICD-10-CM | POA: Diagnosis not present

## 2020-04-05 DIAGNOSIS — D631 Anemia in chronic kidney disease: Secondary | ICD-10-CM | POA: Diagnosis not present

## 2020-04-05 DIAGNOSIS — U071 COVID-19: Secondary | ICD-10-CM | POA: Diagnosis not present

## 2020-04-05 DIAGNOSIS — J1282 Pneumonia due to coronavirus disease 2019: Secondary | ICD-10-CM | POA: Diagnosis not present

## 2020-04-10 DIAGNOSIS — D631 Anemia in chronic kidney disease: Secondary | ICD-10-CM | POA: Diagnosis not present

## 2020-04-10 DIAGNOSIS — I110 Hypertensive heart disease with heart failure: Secondary | ICD-10-CM | POA: Diagnosis not present

## 2020-04-10 DIAGNOSIS — J1282 Pneumonia due to coronavirus disease 2019: Secondary | ICD-10-CM | POA: Diagnosis not present

## 2020-04-10 DIAGNOSIS — U071 COVID-19: Secondary | ICD-10-CM | POA: Diagnosis not present

## 2020-04-10 DIAGNOSIS — I480 Paroxysmal atrial fibrillation: Secondary | ICD-10-CM | POA: Diagnosis not present

## 2020-04-10 DIAGNOSIS — E1122 Type 2 diabetes mellitus with diabetic chronic kidney disease: Secondary | ICD-10-CM | POA: Diagnosis not present

## 2020-04-10 DIAGNOSIS — I5032 Chronic diastolic (congestive) heart failure: Secondary | ICD-10-CM | POA: Diagnosis not present

## 2020-04-10 DIAGNOSIS — N1832 Chronic kidney disease, stage 3b: Secondary | ICD-10-CM | POA: Diagnosis not present

## 2020-04-10 DIAGNOSIS — J9601 Acute respiratory failure with hypoxia: Secondary | ICD-10-CM | POA: Diagnosis not present

## 2020-04-16 DIAGNOSIS — J9601 Acute respiratory failure with hypoxia: Secondary | ICD-10-CM | POA: Diagnosis not present

## 2020-04-16 DIAGNOSIS — I5032 Chronic diastolic (congestive) heart failure: Secondary | ICD-10-CM | POA: Diagnosis not present

## 2020-04-16 DIAGNOSIS — E1122 Type 2 diabetes mellitus with diabetic chronic kidney disease: Secondary | ICD-10-CM | POA: Diagnosis not present

## 2020-04-16 DIAGNOSIS — J1282 Pneumonia due to coronavirus disease 2019: Secondary | ICD-10-CM | POA: Diagnosis not present

## 2020-04-16 DIAGNOSIS — N1832 Chronic kidney disease, stage 3b: Secondary | ICD-10-CM | POA: Diagnosis not present

## 2020-04-16 DIAGNOSIS — I480 Paroxysmal atrial fibrillation: Secondary | ICD-10-CM | POA: Diagnosis not present

## 2020-04-16 DIAGNOSIS — D631 Anemia in chronic kidney disease: Secondary | ICD-10-CM | POA: Diagnosis not present

## 2020-04-16 DIAGNOSIS — U071 COVID-19: Secondary | ICD-10-CM | POA: Diagnosis not present

## 2020-04-16 DIAGNOSIS — I110 Hypertensive heart disease with heart failure: Secondary | ICD-10-CM | POA: Diagnosis not present

## 2020-04-18 DIAGNOSIS — I5032 Chronic diastolic (congestive) heart failure: Secondary | ICD-10-CM | POA: Diagnosis not present

## 2020-04-18 DIAGNOSIS — J9601 Acute respiratory failure with hypoxia: Secondary | ICD-10-CM | POA: Diagnosis not present

## 2020-04-18 DIAGNOSIS — M6281 Muscle weakness (generalized): Secondary | ICD-10-CM | POA: Diagnosis not present

## 2020-04-18 DIAGNOSIS — I4891 Unspecified atrial fibrillation: Secondary | ICD-10-CM | POA: Diagnosis not present

## 2020-04-18 DIAGNOSIS — K859 Acute pancreatitis without necrosis or infection, unspecified: Secondary | ICD-10-CM | POA: Diagnosis not present

## 2020-04-18 DIAGNOSIS — J810 Acute pulmonary edema: Secondary | ICD-10-CM | POA: Diagnosis not present

## 2020-04-18 DIAGNOSIS — N1832 Chronic kidney disease, stage 3b: Secondary | ICD-10-CM | POA: Diagnosis not present

## 2020-04-19 ENCOUNTER — Encounter: Payer: Self-pay | Admitting: Nurse Practitioner

## 2020-04-19 ENCOUNTER — Ambulatory Visit (INDEPENDENT_AMBULATORY_CARE_PROVIDER_SITE_OTHER): Payer: Medicare HMO | Admitting: Nurse Practitioner

## 2020-04-19 ENCOUNTER — Other Ambulatory Visit: Payer: Self-pay

## 2020-04-19 VITALS — BP 192/77 | HR 71 | Ht 68.0 in

## 2020-04-19 DIAGNOSIS — E1165 Type 2 diabetes mellitus with hyperglycemia: Secondary | ICD-10-CM

## 2020-04-19 DIAGNOSIS — Z794 Long term (current) use of insulin: Secondary | ICD-10-CM | POA: Diagnosis not present

## 2020-04-19 NOTE — Progress Notes (Signed)
Endocrinology Consult Note       04/19/2020, 11:41 AM   Subjective:    Patient ID: [name], female    DOB: March 05, 1935.  [name] is being seen in consultation for management of currently uncontrolled symptomatic diabetes requested by  Caitlin Fire, MD.   Past Medical History:  Diagnosis Date  . Arthritis   . Chronic diastolic heart failure ([name])   . CKD (chronic kidney disease) stage 3, GFR 30-59 ml/min (HCC)   . Essential hypertension   . GERD (gastroesophageal reflux disease)   . Hemorrhoids   . History of stroke   . Type 2 diabetes mellitus ([name])     Past Surgical History:  Procedure Laterality Date  . BACK SURGERY    . CATARACT EXTRACTION W/PHACO Left 02/28/2013   Procedure: CATARACT EXTRACTION PHACO AND INTRAOCULAR LENS PLACEMENT (IOL) CDE=15.60;  Surgeon: Elta Guadeloupe T. Gershon Crane, MD;  Location: [facility] ORS;  Service: Ophthalmology;  Laterality: Left;  . CHOLECYSTECTOMY    . COLONOSCOPY  12/12/2003   RMR: Normal rectum.  Long capacious tortuous colon, colonic mucosa appeared normal  . COLONOSCOPY N/A 03/21/2014   Dr. [name]: internal and external hemorrhoids, torturous colon, colonic diverticulosis   . ESOPHAGOGASTRODUODENOSCOPY  12/28/2001   BHA:LPFXT sliding hiatal hernia/. Three small bulbar ulcers, two with stigmata of bleeding and these were coagulated using heater probe unit   . ESOPHAGOGASTRODUODENOSCOPY N/A 03/21/2014   Dr. [name]: cervical esophageal web s/p dilation, negative H.pylori   . EYE SURGERY    . HIP FRACTURE SURGERY  2008  . JOINT REPLACEMENT     Rt hip, ????? sounds like just for fracture  . MALONEY DILATION N/A 03/21/2014   Procedure: [name] DILATION;  Surgeon: [name], MD;  Location: [facility] ENDO SUITE;  Service: Endoscopy;  Laterality: N/A;  . SAVORY DILATION N/A 03/21/2014   Procedure: SAVORY DILATION;  Surgeon: [name], MD;  Location: [facility] END[facility] SU[facility];  Service: Endoscopy;   Laterality: N/A;    Social History   Socioeconomic History  . Marital status: Widowed    Spouse name: Not on file  . Number of children: 8  . Years of education: Not on file  . Highest education level: Not on file  Occupational History  . Not on file  Tobacco Use  . Smoking status: Never Smoker  . Smokeless tobacco: Never Used  Vaping Use  . Vaping Use: Never used  Substance and Sexual Activity  . Alcohol use: No  . Drug use: No  . Sexual activity: Not on file  Other Topics Concern  . Not on file  Social History Narrative  . Not on file   Social Determinants of Health   Financial Resource Strain:   . Difficulty of Paying Living Expenses: Not on file  Food Insecurity:   . Worried About Charity fundraiser in the Last Year: Not on file  . Ran Out of Food in the Last Year: Not on file  Transportation Needs:   . Lack of Transportation (Medical): Not on file  . Lack of Transportation (Non-Medical): Not on file  Physical Activity:   . Days of Exercise per Week: Not on file  . Minutes  of Exercise per Session: Not on file  Stress:   . Feeling of Stress : Not on file  Social Connections:   . Frequency of Communication with Friends and Family: Not on file  . Frequency of Social Gatherings with Friends and Family: Not on file  . Attends Religious Services: Not on file  . Active Member of Clubs or Organizations: Not on file  . Attends Archivist Meetings: Not on file  . Marital Status: Not on file    Family History  Problem Relation Age of Onset  . Crohn's disease Daughter   . Hypertension Mother   . Hypertension Sister   . Diabetes Brother   . Diabetes Sister   . Diabetes Sister   . Heart attack Sister   . Colon cancer Neg Hx     Outpatient Encounter Medications as of 04/19/2020  Medication Sig  . acetaminophen (TYLENOL) 325 MG tablet Take 2 tablets (650 mg total) by mouth every 6 (six) hours as needed for mild pain, fever or headache (or Fever >/= 101).   Marland Kitchen albuterol (VENTOLIN HFA) 108 (90 Base) MCG/ACT inhaler Inhale 2 puffs into the lungs every 6 (six) hours as needed for wheezing or shortness of breath.  Marland Kitchen apixaban (ELIQUIS) 2.5 MG TABS tablet Take 1 tablet (2.5 mg total) by mouth 2 (two) times daily.  Marland Kitchen ascorbic acid (VITAMIN C) 500 MG tablet Take 1 tablet (500 mg total) by mouth daily.  Marland Kitchen dexamethasone (DECADRON) 6 MG tablet Take 1 tablet (6 mg total) by mouth daily with breakfast.  . famotidine (PEPCID) 20 MG tablet Take 1 tablet (20 mg total) by mouth daily.  Marland Kitchen gabapentin (NEURONTIN) 300 MG capsule Take 300 mg by mouth 2 (two) times daily.   Marland Kitchen guaiFENesin-dextromethorphan (ROBITUSSIN DM) 100-10 MG/5ML syrup Take 10 mLs by mouth every 4 (four) hours as needed for cough.  . hydrALAZINE (APRESOLINE) 50 MG tablet Take 1.5 tablets (75 mg total) by mouth 2 (two) times daily.  . hydrocortisone (ANUSOL-HC) 2.5 % rectal cream Place 1 application rectally 2 (two) times daily as needed for hemorrhoids or anal itching.  . insulin aspart (NOVOLOG) 100 UNIT/ML FlexPen Inject 0-10 Units into the skin 3 (three) times daily with meals. insulin aspart (novoLOG) injection 0-10 Units 0-10 Units Subcutaneous, 3 times daily with meals CBG < 70: Implement Hypoglycemia Standing Orders and refer to Hypoglycemia Standing Orders sidebar report  CBG 70 - 120: 0 unit CBG 121 - 150: 0 unit  CBG 151 - 200: 1 unit CBG 201 - 250: 2 units CBG 251 - 300: 4 units CBG 301 - 350: 6 units  CBG 351 - 400: 8 units  CBG > 400: 10 units  . insulin detemir (LEVEMIR) 100 UNIT/ML injection Inject 0.8 mLs (80 Units total) into the skin at bedtime.  . isosorbide mononitrate (IMDUR) 60 MG 24 hr tablet Take 1 tablet (60 mg total) by mouth daily.  . ondansetron (ZOFRAN) 4 MG tablet Take 4 mg by mouth every 6 (six) hours as needed for nausea or vomiting.  . simvastatin (ZOCOR) 20 MG tablet Take 20 mg by mouth at bedtime.  . torsemide (DEMADEX) 20 MG tablet Take 1 tablet (20 mg total) by mouth  daily.  Marland Kitchen zinc sulfate 220 (50 Zn) MG capsule Take 1 capsule (220 mg total) by mouth daily.   Facility-Administered Encounter Medications as of 04/19/2020  Medication  . regadenoson (LEXISCAN) injection SOLN 0.4 mg    ALLERGIES: No Known Allergies  VACCINATION STATUS:  There is no immunization history on file for this patient.  Diabetes She presents for her initial diabetic visit. She has type 2 diabetes mellitus. Onset time: She was diagnosed at approximate age of 59. Her disease course has been worsening. There are no hypoglycemic associated symptoms. Associated symptoms include blurred vision, fatigue, polyphagia and polyuria. There are no hypoglycemic complications. Symptoms are stable. Diabetic complications include heart disease and nephropathy. (Hx of recurrent pancreatitis) Risk factors for coronary artery disease include diabetes mellitus, dyslipidemia, hypertension, obesity, post-menopausal and sedentary lifestyle. Current diabetic treatment includes intensive insulin program. She is compliant with treatment most of the time. Her weight is decreasing steadily. She is following a generally unhealthy diet. When asked about meal planning, she reported none (Has meals on wheels to deliver lunch daily). She has had a previous visit with a dietitian. She rarely (she has been wheelchair bound for about 1 year) participates in exercise. (She presents today for her consultation, accompanied by her granddaughter with no meter or logs to review (her meter at home is not working properly) but she reports she does monitor glucose 4 times daily.  She is a poor historian.  She was hospitalized back in September for CHF exacerbation and was found to have an A1c of 8.9%.  She is currently on Levemir 80 units SQ daily at bedtime and Novolog 2-10 units TID with meals.  She admits to the consumption of diet sodas.  She endorses eating 3 meals per day, avoiding snacks.  She receives assistance from meals on  wheels.  She denies any significant hypoglycemia recently.) An ACE inhibitor/angiotensin II receptor blocker is not being taken. She does not see a podiatrist.Eye exam is not current (she has referral in process).  Hyperlipidemia This is a chronic problem. The current episode started more than 1 year ago. The problem is controlled. Recent lipid tests were reviewed and are normal. Exacerbating diseases include chronic renal disease, diabetes and obesity. Factors aggravating her hyperlipidemia include fatty foods. Current antihyperlipidemic treatment includes statins. The current treatment provides moderate improvement of lipids. Compliance problems include adherence to exercise.  Risk factors for coronary artery disease include diabetes mellitus, dyslipidemia, hypertension, obesity, post-menopausal and a sedentary lifestyle.  Hypertension This is a chronic problem. The current episode started more than 1 year ago. The problem has been waxing and waning since onset. The problem is uncontrolled. Associated symptoms include blurred vision and peripheral edema. There are no associated agents to hypertension. Risk factors for coronary artery disease include diabetes mellitus, dyslipidemia, obesity, post-menopausal state and sedentary lifestyle. Past treatments include diuretics and direct vasodilators. The current treatment provides mild improvement. Compliance problems include exercise and diet.  Hypertensive end-organ damage includes kidney disease and heart failure. Identifiable causes of hypertension include chronic renal disease.    Review of systems  Constitutional: + Minimally fluctuating body weight,  current Body mass index is 33.52 kg/m. , + fatigue, no subjective hyperthermia, no subjective hypothermia Eyes: + blurry vision, no xerophthalmia ENT: no sore throat, no nodules palpated in throat, no dysphagia/odynophagia, no hoarseness Cardiovascular: no chest pain, no shortness of breath, no  palpitations, + leg swelling Respiratory: no cough, no shortness of breath Gastrointestinal: no nausea/vomiting/diarrhea Musculoskeletal: no muscle/joint aches, wheelchair bound for last year due to deconditioning Skin: no rashes, no hyperemia Neurological: no tremors, no numbness, no tingling, no dizziness Psychiatric: no depression, no anxiety   Objective:    BP (!) 192/77 (BP Location: Left Arm, Patient Position: Sitting)   Pulse 71  Ht 5\' 8"  (1.727 m)   BMI 33.52 kg/m   Wt Readings from Last 3 Encounters:  02/12/20 220 lb 7.4 oz (100 kg)  01/12/20 246 lb (111.6 kg)  01/03/20 239 lb 6.7 oz (108.6 kg)    BP Readings from Last 3 Encounters:  04/19/20 (!) 192/77  02/12/20 101/65  01/26/20 (!) 168/80    Physical Exam- Limited  Constitutional:  Body mass index is 33.52 kg/m. , not in acute distress, slightly depressed appearing Eyes:  EOMI, no exophthalmos Neck: Supple Cardiovascular: RRR, no murmers, rubs, or gallops, 1+ pitting edema BLE Respiratory: Adequate breathing efforts, no crackles, rales, rhonchi, or wheezing Musculoskeletal: no gross deformities, wheelchair bound for last year due to deconditioning Skin:  no rashes, no hyperemia Neurological: no tremor with outstretched hands    CMP ( most recent) CMP     Component Value Date/Time   NA 138 02/12/2020 0653   K 3.9 02/12/2020 0653   CL 105 02/12/2020 0653   CO2 24 02/12/2020 0653   GLUCOSE 233 (H) 02/12/2020 0653   BUN 60 (H) 02/12/2020 0653   CREATININE 2.06 (H) 02/12/2020 0653   CALCIUM 7.7 (L) 02/12/2020 0653   PROT 6.1 (L) 02/12/2020 0653   ALBUMIN 2.3 (L) 02/12/2020 0653   AST 25 02/12/2020 0653   ALT 20 02/12/2020 0653   ALKPHOS 36 (L) 02/12/2020 0653   BILITOT 0.5 02/12/2020 0653   GFRNONAA 22 (L) 02/12/2020 0653   GFRAA 25 (L) 02/12/2020 0653     Diabetic Labs (most recent): Lab Results  Component Value Date   HGBA1C 8.9 (H) 02/08/2020   HGBA1C 8.0 (H) 07/23/2019   HGBA1C 8.0 (H)  07/09/2017     Lipid Panel ( most recent) Lipid Panel     Component Value Date/Time   CHOL 118 12/27/2019 0425   TRIG 127 02/08/2020 1000   HDL 33 (L) 12/27/2019 0425   CHOLHDL 3.6 12/27/2019 0425   VLDL 10 12/27/2019 0425   LDLCALC 75 12/27/2019 0425      Lab Results  Component Value Date   TSH 0.420 07/24/2019   TSH 1.264 07/23/2019           Assessment & Plan:   1) Type 2 diabetes mellitus with hyperglycemia, with long-term current use of insulin ([place])  She presents today for her consultation, accompanied by her granddaughter with no meter or logs to review (her meter at home is not working properly) but she reports she does monitor glucose 4 times daily.  She is a poor historian.  She was hospitalized back in September for CHF exacerbation and was found to have an A1c of 8.9%.  She is currently on Levemir 80 units SQ daily at bedtime and Novolog 2-10 units TID with meals.  She admits to the consumption of diet sodas.  She endorses eating 3 meals per day, avoiding snacks.  She receives assistance from meals on wheels.  She denies any significant hypoglycemia recently.  - ANELLY SAMARIN has currently uncontrolled symptomatic type 2 DM since  85 years of age,  with most recent A1c of 8.9 %.   Recent labs reviewed.  - I had a long discussion with her about the progressive nature of diabetes and the pathology behind its complications. -her diabetes is complicated by CHF, CKD stage 3, HTN, HLD, recurrent pancreatitis and she remains at a high risk for more acute and chronic complications which include CAD, CVA, retinopathy, and neuropathy. These are all discussed in detail with her.  -  I have counseled her on diet  and weight management  by adopting a carbohydrate restricted/protein rich diet. Patient is encouraged to switch to  unprocessed or minimally processed     complex starch and increased protein intake (animal or plant source), fruits, and vegetables. -  she is advised to  stick to a routine mealtimes to eat 3 meals  a day and avoid unnecessary snacks ( to snack only to correct hypoglycemia).   - she acknowledges that there is a room for improvement in her food and drink choices. - Suggestion is made for her to avoid simple carbohydrates  from her diet including Cakes, Sweet Desserts, Ice Cream, Soda (diet and regular), Sweet Tea, Candies, Chips, Cookies, Store Bought Juices, Alcohol in Excess of  1-2 drinks a day, Artificial Sweeteners,  Coffee Creamer, and "Sugar-free" Products. This will help patient to have more stable blood glucose profile and potentially avoid unintended weight gain.  - she will be scheduled with Jearld Fenton, RDN, CDE for diabetes education.  - I have approached her with the following individualized plan to manage  her diabetes and patient agrees:   -She is advised to continue her current medication regimen of Levemir 80 units SQ daily at bedtime and continue Novolog 2-10 units TID with meals if glucose is above 90 and she is eating.  Specific instructions on how to titrate insulin dose based on glucose readings given to patient/granddauther in writing.  -She is encouraged to continue monitoring blood glucose 4 times daily, before meals and before bed, to log her readings on the clinic sheets provided and bring them with her to her follow up appointment in 10 days.  - she is warned not to take insulin without proper monitoring per orders. - Adjustment parameters are given to her for hypo and hyperglycemia in writing.  - she is not a candidate for Metformin due to concurrent renal insufficiency.  - she is not a candidate for incretin therapy due to history of recurrent pancreatitis.  - Specific targets for  A1c;  LDL, HDL,  and Triglycerides were discussed with the patient.  2) Blood Pressure /Hypertension: Her blood pressure is not controlled to target.  She has not yet taken her BP meds today.  She is advised to continue Hydralazine 75  mg po twice daily, Demadex 20 mg po daily, and Isosorbide 60 mg po daily.  3) Lipids/Hyperlipidemia:    Her most recent lipid panel from 12/27/19 shows controlled LDL of 75.  She is advised to continue Simvastatin 20 mg po daily at bedtime.  Side effects and precautions discussed with her.  4)  Weight/Diet:  Her Body mass index is 33.52 kg/m.  - clearly complicating her diabetes care.   she is a candidate for modest weight loss. I discussed with her the fact that loss of 5 - 10% of her  current body weight will have the most impact on her diabetes management.  Exercise, and detailed carbohydrates information provided  -  detailed on discharge instructions.  5) Chronic Care/Health Maintenance: -she is not on ACEI/ARB and is on Statin medications and is encouraged to initiate and continue to follow up with Ophthalmology, Dentist,  Podiatrist at least yearly or according to recommendations, and advised to stay away from smoking. I have recommended yearly flu vaccine and pneumonia vaccine at least every 5 years; moderate intensity exercise for up to 150 minutes weekly; and  sleep for at least 7 hours a day.  - she is  advised  to maintain close follow up with Caitlin Fire, MD for primary care needs, as well as her other providers for optimal and coordinated care.   - Time spent in this patient care: 60 min, of which > 50% was spent in  counseling  her about her diabetes and the rest reviewing her blood glucose logs , discussing her hypoglycemia and hyperglycemia episodes, reviewing her current and  previous labs / studies  ( including abstraction from other facilities) and medications  doses and developing a  long term treatment plan based on the latest standards of care/ guidelines; and documenting her care.    Please refer to Patient Instructions for Blood Glucose Monitoring and Insulin/Medications Dosing Guide"  in media tab for additional information. Please  also refer to " Patient Self Inventory"  in the Media  tab for reviewed elements of pertinent patient history.  [name] participated in the discussions, expressed understanding, and voiced agreement with the above plans.  All questions were answered to her satisfaction. she is encouraged to contact clinic should she have any questions or concerns prior to her return visit.   Follow up plan: - Return in about 10 days (around 04/29/2020) for Diabetes follow up, Bring glucometer and logs.  Rayetta Pigg, [facility] [address] [place], [place] [address] Phone: [phone] Fax: [phone]   04/19/2020, 11:41 AM

## 2020-04-19 NOTE — Patient Instructions (Signed)

## 2020-04-24 DIAGNOSIS — E1122 Type 2 diabetes mellitus with diabetic chronic kidney disease: Secondary | ICD-10-CM | POA: Diagnosis not present

## 2020-04-24 DIAGNOSIS — D631 Anemia in chronic kidney disease: Secondary | ICD-10-CM | POA: Diagnosis not present

## 2020-04-24 DIAGNOSIS — J1282 Pneumonia due to coronavirus disease 2019: Secondary | ICD-10-CM | POA: Diagnosis not present

## 2020-04-24 DIAGNOSIS — I110 Hypertensive heart disease with heart failure: Secondary | ICD-10-CM | POA: Diagnosis not present

## 2020-04-24 DIAGNOSIS — I5032 Chronic diastolic (congestive) heart failure: Secondary | ICD-10-CM | POA: Diagnosis not present

## 2020-04-24 DIAGNOSIS — U071 COVID-19: Secondary | ICD-10-CM | POA: Diagnosis not present

## 2020-04-24 DIAGNOSIS — N1832 Chronic kidney disease, stage 3b: Secondary | ICD-10-CM | POA: Diagnosis not present

## 2020-04-24 DIAGNOSIS — J9601 Acute respiratory failure with hypoxia: Secondary | ICD-10-CM | POA: Diagnosis not present

## 2020-04-24 DIAGNOSIS — I480 Paroxysmal atrial fibrillation: Secondary | ICD-10-CM | POA: Diagnosis not present

## 2020-04-26 ENCOUNTER — Other Ambulatory Visit: Payer: Self-pay

## 2020-04-26 MED ORDER — POTASSIUM CHLORIDE CRYS ER 20 MEQ PO TBCR: 20.0000 meq | EXTENDED_RELEASE_TABLET | Freq: Every day | ORAL | 0 refills | Status: DC

## 2020-04-29 DIAGNOSIS — J1282 Pneumonia due to coronavirus disease 2019: Secondary | ICD-10-CM | POA: Diagnosis not present

## 2020-04-29 DIAGNOSIS — U071 COVID-19: Secondary | ICD-10-CM | POA: Diagnosis not present

## 2020-04-29 DIAGNOSIS — I110 Hypertensive heart disease with heart failure: Secondary | ICD-10-CM | POA: Diagnosis not present

## 2020-04-29 DIAGNOSIS — I5032 Chronic diastolic (congestive) heart failure: Secondary | ICD-10-CM | POA: Diagnosis not present

## 2020-04-29 DIAGNOSIS — N1832 Chronic kidney disease, stage 3b: Secondary | ICD-10-CM | POA: Diagnosis not present

## 2020-04-29 DIAGNOSIS — E1122 Type 2 diabetes mellitus with diabetic chronic kidney disease: Secondary | ICD-10-CM | POA: Diagnosis not present

## 2020-04-29 DIAGNOSIS — I480 Paroxysmal atrial fibrillation: Secondary | ICD-10-CM | POA: Diagnosis not present

## 2020-04-29 DIAGNOSIS — J9601 Acute respiratory failure with hypoxia: Secondary | ICD-10-CM | POA: Diagnosis not present

## 2020-04-29 DIAGNOSIS — D631 Anemia in chronic kidney disease: Secondary | ICD-10-CM | POA: Diagnosis not present

## 2020-04-30 ENCOUNTER — Ambulatory Visit: Payer: Medicare HMO | Admitting: Nurse Practitioner

## 2020-05-01 DIAGNOSIS — E1142 Type 2 diabetes mellitus with diabetic polyneuropathy: Secondary | ICD-10-CM | POA: Diagnosis not present

## 2020-05-01 DIAGNOSIS — I5032 Chronic diastolic (congestive) heart failure: Secondary | ICD-10-CM | POA: Diagnosis not present

## 2020-05-19 DIAGNOSIS — J9601 Acute respiratory failure with hypoxia: Secondary | ICD-10-CM | POA: Diagnosis not present

## 2020-05-19 DIAGNOSIS — I5032 Chronic diastolic (congestive) heart failure: Secondary | ICD-10-CM | POA: Diagnosis not present

## 2020-05-19 DIAGNOSIS — M6281 Muscle weakness (generalized): Secondary | ICD-10-CM | POA: Diagnosis not present

## 2020-05-19 DIAGNOSIS — K859 Acute pancreatitis without necrosis or infection, unspecified: Secondary | ICD-10-CM | POA: Diagnosis not present

## 2020-05-19 DIAGNOSIS — N1832 Chronic kidney disease, stage 3b: Secondary | ICD-10-CM | POA: Diagnosis not present

## 2020-05-19 DIAGNOSIS — J810 Acute pulmonary edema: Secondary | ICD-10-CM | POA: Diagnosis not present

## 2020-05-19 DIAGNOSIS — I4891 Unspecified atrial fibrillation: Secondary | ICD-10-CM | POA: Diagnosis not present

## 2020-05-21 ENCOUNTER — Ambulatory Visit: Payer: Medicare HMO | Admitting: "Endocrinology

## 2020-05-27 ENCOUNTER — Telehealth: Payer: Self-pay

## 2020-05-27 ENCOUNTER — Ambulatory Visit: Payer: Medicare HMO | Admitting: "Endocrinology

## 2020-05-27 NOTE — Telephone Encounter (Signed)
Called patient bc she missed her appt again today. She said that she will not be back as a patient

## 2020-06-01 DIAGNOSIS — E1142 Type 2 diabetes mellitus with diabetic polyneuropathy: Secondary | ICD-10-CM | POA: Diagnosis not present

## 2020-06-01 DIAGNOSIS — I10 Essential (primary) hypertension: Secondary | ICD-10-CM | POA: Diagnosis not present

## 2020-06-12 ENCOUNTER — Encounter: Payer: Medicare HMO | Attending: Nurse Practitioner | Admitting: Nutrition

## 2020-06-12 ENCOUNTER — Encounter: Payer: Self-pay | Admitting: Nutrition

## 2020-06-12 ENCOUNTER — Other Ambulatory Visit: Payer: Self-pay

## 2020-06-12 DIAGNOSIS — N183 Chronic kidney disease, stage 3 unspecified: Secondary | ICD-10-CM | POA: Insufficient documentation

## 2020-06-12 DIAGNOSIS — Z794 Long term (current) use of insulin: Secondary | ICD-10-CM | POA: Diagnosis not present

## 2020-06-12 DIAGNOSIS — E1165 Type 2 diabetes mellitus with hyperglycemia: Secondary | ICD-10-CM | POA: Diagnosis not present

## 2020-06-12 DIAGNOSIS — E1121 Type 2 diabetes mellitus with diabetic nephropathy: Secondary | ICD-10-CM | POA: Insufficient documentation

## 2020-06-12 DIAGNOSIS — IMO0002 Reserved for concepts with insufficient information to code with codable children: Secondary | ICD-10-CM

## 2020-06-12 DIAGNOSIS — E118 Type 2 diabetes mellitus with unspecified complications: Secondary | ICD-10-CM | POA: Insufficient documentation

## 2020-06-12 DIAGNOSIS — I10 Essential (primary) hypertension: Secondary | ICD-10-CM | POA: Diagnosis not present

## 2020-06-12 NOTE — Telephone Encounter (Signed)
TC to her son to discuss her sliding scale insulin regimen. He notes his sister came over and explained it to her mom better and now he nows how to use the sliding scale correctly. He verbalized he will make sure she takes the correct amount of insulin at meal times. Discussed the dangers of her taking 10-30 units with meals like she told me she was taking. He verbalized understanding.

## 2020-06-12 NOTE — Patient Instructions (Signed)
Goals Established by Pt Eat three meals per day Drink 4 bottles of water per day Follow the meal time insulin schedule given by Dr. Liliane Channel office: Take 2 units of Novolog if BS 90-150, Add 1 unit to every 50 points above 150.  DO NOT take 10 or 30 units of meal time insulin as this can cause sever low blood sugars. Do not skip meals Have family assist with proper medication administration for safety, especially insulin. Keep a record on paper the blood sugars and how much insulin was taken with meals.

## 2020-06-12 NOTE — Progress Notes (Signed)
Medical Nutrition Therapy  Appointment Start time:  [phone]  Appointment End time:  [id number] This visit was completed via telephone due to the COVID-19 pandemic.   I spoke with [name] and her son and verified that I was speaking with the correct person with two patient identifiers (full name and date of birth).   I discussed the limitations related to this kind of visit and the patient is willing to proceed. Primary concerns today: DM Type 2 Referral diagnosis: E11.8 Preferred learning style: no preference indicated)  Learning readiness:  ready,  Was seeing Dr. Dorris Fetch, Endocrinology but hasn't been back after 1-2 visits. PCP Dr. Legrand Rams FBS 198 mg/gl.  BS before lunch was 202, Bed  bed 171 mg/dl.  80 of levermir.  She reports taking between 10 and 30 units of bolus insulin with meals. Denies any low blood sugars. Appears to misunderstand the sliding scale for bolus insulin. Her son works 2nd shift and helps with meals.  NUTRITION ASSESSMENT  She is currently at very high risk for hypoglycemia based on the information that she is telling me with how much insulin she is taking. I called and talked to he son to discuss my concerns. He and his sister have re educated her on how to take her insulin based on pre meal blood sugars.   Anthropometrics  No wt taken. Lab Results  Component Value Date   HGBA1C 8.9 (H) 02/08/2020     Clinical Medical Hx:  Dm Type 2,  Medications: 80 units of Levermeir and sliding cale of Novolog starting with 2 units and add 1 unit every 50 pts above 150. Labs:  Lab Results  Component Value Date   HGBA1C 8.9 (H) 02/08/2020   CMP Latest Ref Rng & Units 02/12/2020 02/11/2020 02/10/2020  Glucose 70 - 99 mg/dL 233(H) 126(H) 138(H)  BUN 8 - 23 mg/dL 60(H) 48(H) 44(H)  Creatinine 0.44 - 1.00 mg/dL 2.06(H) 1.80(H) 1.84(H)  Sodium 135 - 145 mmol/L 138 139 138  Potassium 3.5 - 5.1 mmol/L 3.9 4.0 3.9  Chloride 98 - 111 mmol/L 105 106 104  CO2 22 - 32 mmol/L '24 23 24   '$ Calcium 8.9 - 10.3 mg/dL 7.7(L) 7.8(L) 8.1(L)  Total Protein 6.5 - 8.1 g/dL 6.1(L) 6.3(L) 6.9  Total Bilirubin 0.3 - 1.2 mg/dL 0.5 0.8 0.6  Alkaline Phos 38 - 126 U/L 36(L) 36(L) 40  AST 15 - 41 U/L 25 39 52(H)  ALT 0 - 44 U/L 20 27 32   Lipid Panel     Component Value Date/Time   CHOL 118 12/27/2019 0425   TRIG 127 02/08/2020 1000   HDL 33 (L) 12/27/2019 0425   CHOLHDL 3.6 12/27/2019 0425   VLDL 10 12/27/2019 0425   LDLCALC 75 12/27/2019 0425    Notable Signs/Symptoms: denies any  Lifestyle & Dietary Hx Lives in her house and her son lives with her.   Estimated daily fluid intake: 24 oz Supplements: zinc, vit C Sleep: 8 hrs  Stress / self-care: none Current average weekly physical activity:  ADL, sometimes walks with a cane  24-Hr Dietary Recall First Meal: sausage, cherrios, milk, yogurt Snack: Second Meal:CHicken, rice, cucumbers, carrots, water,  Diet soda, Snack:  Third Meal: Hamburger 2, black eyed peas, water Snack: yogurt Beverages: water, some diet soda.  Estimated Energy Needs Calories: 1200  Carbohydrate: 135g Protein: 90g Fat: 33g   NUTRITION DIAGNOSIS  NB-1.1 Food and nutrition-related knowledge deficit As related to Diabetes Type 2.  As evidenced by A1C  9.8%.   NUTRITION INTERVENTION  Nutrition education (E-1) on the following topics:  Nutrition and Diabetes education provided on My Plate, CHO counting, meal planning, portion sizes, timing of meals, avoiding snacks between meals unless having a low blood sugar, target ranges for A1C and blood sugars, signs/symptoms and treatment of hyper/hypoglycemia, monitoring blood sugars, taking medications as prescribed, benefits of exercising 30 minutes per day and prevention of complications of DM. Serious dangers of taking too much insulin and risk of diabetic coma or death. Stressed importance of following sliding scale correctly per instruction sheet.   Handouts Provided Include   My Plate  Diabetes  Instructions.   Learning Style & Readiness for Change Teaching method utilized: Visual & Auditory  Demonstrated degree of understanding via: Teach Back  Barriers to learning/adherence to lifestyle change: cognitive ability and memory may be a concern  Goals Established by Pt Eat three meals per day Drink 4 bottles of water per day Follow the meal time insulin schedule given by Dr. Liliane Channel office: Take 2 units of Novolog if BS 90-150, Add 1 unit to every 50 points above 150.  DO NOT take 10 or 30 units of meal time insulin as this can cause sever low blood sugars. Do not skip meals Have family assist with proper medication administration for safety, especially insulin. Keep a record on paper the blood sugars and how much insulin was taken with meals.   MONITORING & EVALUATION Dietary intake, weekly physical activity, and blood sugars  in 1 month..   Next Steps  Patient is to follow the sliding scale insulin instructions

## 2020-06-12 NOTE — Telephone Encounter (Signed)
No answer for phone visit. Will try again later.PC

## 2020-06-19 DIAGNOSIS — I5032 Chronic diastolic (congestive) heart failure: Secondary | ICD-10-CM | POA: Diagnosis not present

## 2020-06-19 DIAGNOSIS — N1832 Chronic kidney disease, stage 3b: Secondary | ICD-10-CM | POA: Diagnosis not present

## 2020-06-19 DIAGNOSIS — J9601 Acute respiratory failure with hypoxia: Secondary | ICD-10-CM | POA: Diagnosis not present

## 2020-06-19 DIAGNOSIS — J810 Acute pulmonary edema: Secondary | ICD-10-CM | POA: Diagnosis not present

## 2020-06-19 DIAGNOSIS — K859 Acute pancreatitis without necrosis or infection, unspecified: Secondary | ICD-10-CM | POA: Diagnosis not present

## 2020-06-19 DIAGNOSIS — M6281 Muscle weakness (generalized): Secondary | ICD-10-CM | POA: Diagnosis not present

## 2020-06-19 DIAGNOSIS — I4891 Unspecified atrial fibrillation: Secondary | ICD-10-CM | POA: Diagnosis not present

## 2020-07-02 DIAGNOSIS — E1142 Type 2 diabetes mellitus with diabetic polyneuropathy: Secondary | ICD-10-CM | POA: Diagnosis not present

## 2020-07-02 DIAGNOSIS — G8190 Hemiplegia, unspecified affecting unspecified side: Secondary | ICD-10-CM | POA: Diagnosis not present

## 2020-07-17 DIAGNOSIS — I4891 Unspecified atrial fibrillation: Secondary | ICD-10-CM | POA: Diagnosis not present

## 2020-07-17 DIAGNOSIS — I5032 Chronic diastolic (congestive) heart failure: Secondary | ICD-10-CM | POA: Diagnosis not present

## 2020-07-17 DIAGNOSIS — J9601 Acute respiratory failure with hypoxia: Secondary | ICD-10-CM | POA: Diagnosis not present

## 2020-07-17 DIAGNOSIS — J810 Acute pulmonary edema: Secondary | ICD-10-CM | POA: Diagnosis not present

## 2020-07-17 DIAGNOSIS — K859 Acute pancreatitis without necrosis or infection, unspecified: Secondary | ICD-10-CM | POA: Diagnosis not present

## 2020-07-17 DIAGNOSIS — M6281 Muscle weakness (generalized): Secondary | ICD-10-CM | POA: Diagnosis not present

## 2020-07-17 DIAGNOSIS — N1832 Chronic kidney disease, stage 3b: Secondary | ICD-10-CM | POA: Diagnosis not present

## 2020-07-30 DIAGNOSIS — I5032 Chronic diastolic (congestive) heart failure: Secondary | ICD-10-CM | POA: Diagnosis not present

## 2020-07-30 DIAGNOSIS — E1142 Type 2 diabetes mellitus with diabetic polyneuropathy: Secondary | ICD-10-CM | POA: Diagnosis not present

## 2020-08-30 DIAGNOSIS — E1142 Type 2 diabetes mellitus with diabetic polyneuropathy: Secondary | ICD-10-CM | POA: Diagnosis not present

## 2020-08-30 DIAGNOSIS — I5032 Chronic diastolic (congestive) heart failure: Secondary | ICD-10-CM | POA: Diagnosis not present

## 2020-09-24 ENCOUNTER — Encounter (HOSPITAL_COMMUNITY): Payer: Self-pay | Admitting: *Deleted

## 2020-09-24 ENCOUNTER — Emergency Department (HOSPITAL_COMMUNITY): Payer: Medicare HMO

## 2020-09-24 ENCOUNTER — Other Ambulatory Visit: Payer: Self-pay

## 2020-09-24 ENCOUNTER — Inpatient Hospital Stay (HOSPITAL_COMMUNITY)
Admission: EM | Admit: 2020-09-24 | Discharge: 2020-09-30 | DRG: 064 | Disposition: A | Discharge status: Rehabilitation Facility | Payer: Medicare HMO | Attending: Family Medicine | Admitting: Family Medicine

## 2020-09-24 DIAGNOSIS — E785 Hyperlipidemia, unspecified: Secondary | ICD-10-CM | POA: Diagnosis present

## 2020-09-24 DIAGNOSIS — Z8249 Family history of ischemic heart disease and other diseases of the circulatory system: Secondary | ICD-10-CM

## 2020-09-24 DIAGNOSIS — K5901 Slow transit constipation: Secondary | ICD-10-CM | POA: Diagnosis not present

## 2020-09-24 DIAGNOSIS — I5043 Acute on chronic combined systolic (congestive) and diastolic (congestive) heart failure: Secondary | ICD-10-CM | POA: Diagnosis not present

## 2020-09-24 DIAGNOSIS — J9601 Acute respiratory failure with hypoxia: Secondary | ICD-10-CM | POA: Diagnosis present

## 2020-09-24 DIAGNOSIS — Z7901 Long term (current) use of anticoagulants: Secondary | ICD-10-CM | POA: Diagnosis not present

## 2020-09-24 DIAGNOSIS — I482 Chronic atrial fibrillation, unspecified: Secondary | ICD-10-CM | POA: Diagnosis not present

## 2020-09-24 DIAGNOSIS — J810 Acute pulmonary edema: Secondary | ICD-10-CM | POA: Diagnosis not present

## 2020-09-24 DIAGNOSIS — I509 Heart failure, unspecified: Secondary | ICD-10-CM | POA: Diagnosis not present

## 2020-09-24 DIAGNOSIS — E669 Obesity, unspecified: Secondary | ICD-10-CM | POA: Diagnosis present

## 2020-09-24 DIAGNOSIS — R0603 Acute respiratory distress: Secondary | ICD-10-CM | POA: Diagnosis not present

## 2020-09-24 DIAGNOSIS — I69122 Dysarthria following nontraumatic intracerebral hemorrhage: Secondary | ICD-10-CM | POA: Diagnosis not present

## 2020-09-24 DIAGNOSIS — I5032 Chronic diastolic (congestive) heart failure: Secondary | ICD-10-CM | POA: Diagnosis not present

## 2020-09-24 DIAGNOSIS — G8194 Hemiplegia, unspecified affecting left nondominant side: Secondary | ICD-10-CM | POA: Diagnosis present

## 2020-09-24 DIAGNOSIS — E1165 Type 2 diabetes mellitus with hyperglycemia: Secondary | ICD-10-CM | POA: Diagnosis present

## 2020-09-24 DIAGNOSIS — I609 Nontraumatic subarachnoid hemorrhage, unspecified: Secondary | ICD-10-CM | POA: Diagnosis present

## 2020-09-24 DIAGNOSIS — I610 Nontraumatic intracerebral hemorrhage in hemisphere, subcortical: Secondary | ICD-10-CM

## 2020-09-24 DIAGNOSIS — K219 Gastro-esophageal reflux disease without esophagitis: Secondary | ICD-10-CM | POA: Diagnosis present

## 2020-09-24 DIAGNOSIS — I130 Hypertensive heart and chronic kidney disease with heart failure and stage 1 through stage 4 chronic kidney disease, or unspecified chronic kidney disease: Secondary | ICD-10-CM | POA: Diagnosis present

## 2020-09-24 DIAGNOSIS — I69121 Dysphasia following nontraumatic intracerebral hemorrhage: Secondary | ICD-10-CM | POA: Diagnosis not present

## 2020-09-24 DIAGNOSIS — I110 Hypertensive heart disease with heart failure: Secondary | ICD-10-CM | POA: Diagnosis not present

## 2020-09-24 DIAGNOSIS — I161 Hypertensive emergency: Secondary | ICD-10-CM

## 2020-09-24 DIAGNOSIS — R0689 Other abnormalities of breathing: Secondary | ICD-10-CM | POA: Diagnosis not present

## 2020-09-24 DIAGNOSIS — E1122 Type 2 diabetes mellitus with diabetic chronic kidney disease: Secondary | ICD-10-CM | POA: Diagnosis present

## 2020-09-24 DIAGNOSIS — R4781 Slurred speech: Secondary | ICD-10-CM | POA: Diagnosis present

## 2020-09-24 DIAGNOSIS — N179 Acute kidney failure, unspecified: Secondary | ICD-10-CM | POA: Diagnosis present

## 2020-09-24 DIAGNOSIS — I639 Cerebral infarction, unspecified: Secondary | ICD-10-CM | POA: Diagnosis not present

## 2020-09-24 DIAGNOSIS — Z20822 Contact with and (suspected) exposure to covid-19: Secondary | ICD-10-CM | POA: Diagnosis present

## 2020-09-24 DIAGNOSIS — I69351 Hemiplegia and hemiparesis following cerebral infarction affecting right dominant side: Secondary | ICD-10-CM

## 2020-09-24 DIAGNOSIS — N1832 Chronic kidney disease, stage 3b: Secondary | ICD-10-CM | POA: Diagnosis not present

## 2020-09-24 DIAGNOSIS — I619 Nontraumatic intracerebral hemorrhage, unspecified: Secondary | ICD-10-CM | POA: Diagnosis not present

## 2020-09-24 DIAGNOSIS — Z6830 Body mass index (BMI) 30.0-30.9, adult: Secondary | ICD-10-CM

## 2020-09-24 DIAGNOSIS — I5031 Acute diastolic (congestive) heart failure: Secondary | ICD-10-CM | POA: Diagnosis not present

## 2020-09-24 DIAGNOSIS — I613 Nontraumatic intracerebral hemorrhage in brain stem: Secondary | ICD-10-CM | POA: Diagnosis not present

## 2020-09-24 DIAGNOSIS — Z794 Long term (current) use of insulin: Secondary | ICD-10-CM

## 2020-09-24 DIAGNOSIS — Z9989 Dependence on other enabling machines and devices: Secondary | ICD-10-CM | POA: Diagnosis not present

## 2020-09-24 DIAGNOSIS — R519 Headache, unspecified: Secondary | ICD-10-CM | POA: Diagnosis not present

## 2020-09-24 DIAGNOSIS — I69128 Other speech and language deficits following nontraumatic intracerebral hemorrhage: Secondary | ICD-10-CM | POA: Diagnosis not present

## 2020-09-24 DIAGNOSIS — Z79899 Other long term (current) drug therapy: Secondary | ICD-10-CM | POA: Diagnosis not present

## 2020-09-24 DIAGNOSIS — E8770 Fluid overload, unspecified: Secondary | ICD-10-CM | POA: Diagnosis not present

## 2020-09-24 DIAGNOSIS — I10 Essential (primary) hypertension: Secondary | ICD-10-CM | POA: Diagnosis not present

## 2020-09-24 DIAGNOSIS — R0902 Hypoxemia: Secondary | ICD-10-CM | POA: Diagnosis not present

## 2020-09-24 DIAGNOSIS — I878 Other specified disorders of veins: Secondary | ICD-10-CM | POA: Diagnosis present

## 2020-09-24 DIAGNOSIS — I6503 Occlusion and stenosis of bilateral vertebral arteries: Secondary | ICD-10-CM | POA: Diagnosis not present

## 2020-09-24 DIAGNOSIS — E1169 Type 2 diabetes mellitus with other specified complication: Secondary | ICD-10-CM | POA: Diagnosis not present

## 2020-09-24 DIAGNOSIS — G4489 Other headache syndrome: Secondary | ICD-10-CM | POA: Diagnosis not present

## 2020-09-24 DIAGNOSIS — E869 Volume depletion, unspecified: Secondary | ICD-10-CM | POA: Diagnosis present

## 2020-09-24 DIAGNOSIS — R000 Tachycardia, unspecified: Secondary | ICD-10-CM | POA: Diagnosis not present

## 2020-09-24 DIAGNOSIS — R2689 Other abnormalities of gait and mobility: Secondary | ICD-10-CM | POA: Diagnosis not present

## 2020-09-24 DIAGNOSIS — R059 Cough, unspecified: Secondary | ICD-10-CM | POA: Diagnosis not present

## 2020-09-24 DIAGNOSIS — I454 Nonspecific intraventricular block: Secondary | ICD-10-CM | POA: Diagnosis not present

## 2020-09-24 DIAGNOSIS — I6389 Other cerebral infarction: Secondary | ICD-10-CM | POA: Diagnosis not present

## 2020-09-24 DIAGNOSIS — Z833 Family history of diabetes mellitus: Secondary | ICD-10-CM

## 2020-09-24 DIAGNOSIS — K5900 Constipation, unspecified: Secondary | ICD-10-CM | POA: Diagnosis not present

## 2020-09-24 HISTORY — DX: Heart failure, unspecified: I50.9

## 2020-09-24 LAB — BASIC METABOLIC PANEL
Anion gap: 10 (ref 5–15)
BUN: 28 mg/dL — ABNORMAL HIGH (ref 8–23)
CO2: 25 mmol/L (ref 22–32)
Calcium: 8.8 mg/dL — ABNORMAL LOW (ref 8.9–10.3)
Chloride: 103 mmol/L (ref 98–111)
Creatinine, Ser: 1.58 mg/dL — ABNORMAL HIGH (ref 0.44–1.00)
GFR, Estimated: 32 mL/min — ABNORMAL LOW (ref >60)
Glucose, Bld: 345 mg/dL — ABNORMAL HIGH (ref 70–99)
Potassium: 3.9 mmol/L (ref 3.5–5.1)
Sodium: 138 mmol/L (ref 135–145)

## 2020-09-24 LAB — CBC WITH DIFFERENTIAL/PLATELET
Abs Immature Granulocytes: 0.04 10*3/uL (ref 0.00–0.07)
Basophils Absolute: 0.1 10*3/uL (ref 0.0–0.1)
Basophils Relative: 1 %
Eosinophils Absolute: 0.1 10*3/uL (ref 0.0–0.5)
Eosinophils Relative: 1 %
HCT: 40.8 % (ref 36.0–46.0)
Hemoglobin: 12.7 g/dL (ref 12.0–15.0)
Immature Granulocytes: 0 %
Lymphocytes Relative: 17 %
Lymphs Abs: 1.8 10*3/uL (ref 0.7–4.0)
MCH: 28.9 pg (ref 26.0–34.0)
MCHC: 31.1 g/dL (ref 30.0–36.0)
MCV: 92.7 fL (ref 80.0–100.0)
Monocytes Absolute: 0.5 10*3/uL (ref 0.1–1.0)
Monocytes Relative: 5 %
Neutro Abs: 7.9 10*3/uL — ABNORMAL HIGH (ref 1.7–7.7)
Neutrophils Relative %: 76 %
Platelets: 253 10*3/uL (ref 150–400)
RBC: 4.4 MIL/uL (ref 3.87–5.11)
RDW: 15.1 % (ref 11.5–15.5)
WBC: 10.4 10*3/uL (ref 4.0–10.5)
nRBC: 0 % (ref 0.0–0.2)

## 2020-09-24 LAB — I-STAT CHEM 8, ED
BUN: 18 mg/dL (ref 8–23)
Calcium, Ion: 0.53 mmol/L — CL (ref 1.15–1.40)
Chloride: 122 mmol/L — ABNORMAL HIGH (ref 98–111)
Creatinine, Ser: 0.7 mg/dL (ref 0.44–1.00)
Glucose, Bld: 217 mg/dL — ABNORMAL HIGH (ref 70–99)
HCT: 21 % — ABNORMAL LOW (ref 36.0–46.0)
Hemoglobin: 7.1 g/dL — ABNORMAL LOW (ref 12.0–15.0)
Potassium: 2.3 mmol/L — CL (ref 3.5–5.1)
Sodium: 145 mmol/L (ref 135–145)
TCO2: 15 mmol/L — ABNORMAL LOW (ref 22–32)

## 2020-09-24 LAB — PROTIME-INR
INR: 1.1 (ref 0.8–1.2)
Prothrombin Time: 13.7 seconds (ref 11.4–15.2)

## 2020-09-24 LAB — RESP PANEL BY RT-PCR (FLU A&B, COVID) ARPGX2
Influenza A by PCR: NEGATIVE
Influenza B by PCR: NEGATIVE
SARS Coronavirus 2 by RT PCR: NEGATIVE

## 2020-09-24 LAB — BRAIN NATRIURETIC PEPTIDE: B Natriuretic Peptide: 101 pg/mL — ABNORMAL HIGH (ref 0.0–100.0)

## 2020-09-24 MED ORDER — STROKE: EARLY STAGES OF RECOVERY BOOK: Freq: Once | Status: AC

## 2020-09-24 MED ORDER — CHLORHEXIDINE GLUCONATE CLOTH 2 % EX PADS
6.0000 | MEDICATED_PAD | Freq: Every day | CUTANEOUS | Status: DC
  Administered 2020-09-26 – 2020-09-30 (×5): 6 via TOPICAL

## 2020-09-24 MED ORDER — IOHEXOL 350 MG/ML SOLN
75.0000 mL | Freq: Once | INTRAVENOUS | Status: AC | PRN
  Administered 2020-09-24: 75 mL via INTRAVENOUS

## 2020-09-24 MED ORDER — ACETAMINOPHEN 650 MG RE SUPP: 650.0000 mg | RECTAL | Status: DC | PRN

## 2020-09-24 MED ORDER — CLEVIDIPINE BUTYRATE 0.5 MG/ML IV EMUL
0.0000 mg/h | INTRAVENOUS | Status: DC
  Administered 2020-09-24: 18 mg/h via INTRAVENOUS
  Administered 2020-09-24: 1 mg/h via INTRAVENOUS
  Administered 2020-09-25: 18 mg/h via INTRAVENOUS
  Administered 2020-09-25: 15 mg/h via INTRAVENOUS
  Administered 2020-09-25: 18 mg/h via INTRAVENOUS
  Administered 2020-09-25: 15 mg/h via INTRAVENOUS
  Administered 2020-09-25: 10 mg/h via INTRAVENOUS
  Administered 2020-09-25 (×3): 18 mg/h via INTRAVENOUS
  Filled 2020-09-24: qty 50
  Filled 2020-09-24 (×2): qty 100
  Filled 2020-09-24: qty 50
  Filled 2020-09-24: qty 100
  Filled 2020-09-24 (×3): qty 50

## 2020-09-24 MED ORDER — SENNOSIDES-DOCUSATE SODIUM 8.6-50 MG PO TABS
1.0000 | ORAL_TABLET | Freq: Two times a day (BID) | ORAL | Status: DC
  Administered 2020-09-25 – 2020-09-30 (×11): 1 via ORAL
  Filled 2020-09-24 (×11): qty 1

## 2020-09-24 MED ORDER — MIDAZOLAM HCL 2 MG/2ML IJ SOLN
2.0000 mg | Freq: Once | INTRAMUSCULAR | Status: DC
  Filled 2020-09-24: qty 2

## 2020-09-24 MED ORDER — PANTOPRAZOLE SODIUM 40 MG IV SOLR
40.0000 mg | Freq: Every day | INTRAVENOUS | Status: DC
  Administered 2020-09-25 (×2): 40 mg via INTRAVENOUS
  Filled 2020-09-24 (×2): qty 40

## 2020-09-24 MED ORDER — ACETAMINOPHEN 160 MG/5ML PO SOLN: 650.0000 mg | ORAL | Status: DC | PRN

## 2020-09-24 MED ORDER — METOCLOPRAMIDE HCL 5 MG/ML IJ SOLN
10.0000 mg | Freq: Once | INTRAMUSCULAR | Status: AC
  Administered 2020-09-24: 10 mg via INTRAVENOUS
  Filled 2020-09-24: qty 2

## 2020-09-24 MED ORDER — ETOMIDATE 2 MG/ML IV SOLN
10.0000 mg | Freq: Once | INTRAVENOUS | Status: DC
  Filled 2020-09-24: qty 10

## 2020-09-24 MED ORDER — EMPTY CONTAINERS FLEXIBLE MISC
900.0000 mg | Freq: Once | Status: AC
  Administered 2020-09-24: 900 mg via INTRAVENOUS
  Filled 2020-09-24: qty 90

## 2020-09-24 MED ORDER — ACETAMINOPHEN 325 MG PO TABS
650.0000 mg | ORAL_TABLET | ORAL | Status: DC | PRN
  Administered 2020-09-25 – 2020-09-27 (×4): 650 mg via ORAL
  Filled 2020-09-24 (×4): qty 2

## 2020-09-24 NOTE — ED Provider Notes (Signed)
[facility] EMERGENCY DEPARTMENT Provider Note   CSN: [id number] Arrival date & time: 09/24/20  1726     History Chief Complaint  Patient presents with  . Headache    [name] is a 86 y.o. female.  HPI    CC: Headaches  Pt has hx of CHF, CKD, DM, stroke.  Headache started today, just before ED arrival. Sudden severe headache, generalized, sharp, radiating down the neck.  No nausea, emesis. No associated numbness, tingling, focal weakness, dizziness, vision change.  No recent trauma. No hx of similar pain.  During assessment, pt reports that the headache is better and down to 5/10, neck pain is gone. No chest pain. Unsure if there was jaw pain.  Per EMS, pressure was 240/120 when they first arrived.  Past Medical History:  Diagnosis Date  . Arthritis   . CHF (congestive heart failure) ([place])   . Chronic diastolic heart failure ([place])   . CKD (chronic kidney disease) stage 3, GFR 30-59 ml/min (HCC)   . Essential hypertension   . GERD (gastroesophageal reflux disease)   . Hemorrhoids   . History of stroke   . Type 2 diabetes mellitus [facility])     Patient Active Problem List   Diagnosis Date Noted  . ICH (intracerebral hemorrhage) ([place]) 09/24/2020  . Pneumonia due to COVID-19 virus 02/12/2020  . Acute hypoxemic respiratory failure due to COVID-19 ([place]) 02/08/2020  . Educated about COVID-19 virus infection 01/11/2020  . Acute urinary retention   . Acute pulmonary edema (HCC)   . AKI (acute kidney injury) ([place]) 07/25/2019  . Acute respiratory failure with hypercapnia ([place]) 07/24/2019  . Pancreatitis, acute 07/23/2019  . Diabetes mellitus type 2 with complications ([place]) [id number]  . Atrial fibrillation, chronic ([place]) 07/23/2019  . Chronic diastolic CHF (congestive heart failure) (HCC)/EF > 60 % 07/23/2019  . Shortness of breath 06/19/2019  . Acute diastolic CHF (congestive heart failure) ([place]) 07/08/2017  . Acute respiratory failure with hypoxemia ([place])  07/08/2017  . Uncontrolled type 2 diabetes mellitus with diabetic nephropathy, with long-term current use of insulin ([place]) 07/08/2017  . CKD (chronic kidney disease), stage IIIb 07/08/2017  . Essential hypertension 07/08/2017  . Hypertension 06/23/2017  . Prolapsed internal hemorrhoids, grade 3 06/25/2016  . Abdominal pain 05/08/2015  . Dysphagia, pharyngoesophageal phase   . Other hemorrhoids   . Diverticulosis of colon without hemorrhage   . GERD (gastroesophageal reflux disease) 02/28/2014  . Constipation 02/28/2014  . Esophageal dysphagia 02/28/2014  . Rectal bleeding 02/28/2014  . OSTEOARTHRITIS, KNEE, RIGHT, SEVERE 12/13/2008  . DEGENERATIVE DISC DISEASE, LUMBOSACRAL SPINE W/RADICULOPATHY 12/13/2008  . BACK PAIN 12/13/2008  . DIABETES 04/13/2007  . FRACTURE, FEMUR, INTERTROCHANTERIC REGION 04/13/2007    Past Surgical History:  Procedure Laterality Date  . BACK SURGERY    . CATARACT EXTRACTION W/PHACO Left 02/28/2013   Procedure: CATARACT EXTRACTION PHACO AND INTRAOCULAR LENS PLACEMENT (IOL) CDE=15.60;  Surgeon: Elta Guadeloupe T. Gershon Crane, MD;  Location: [facility] ORS;  Service: Ophthalmology;  Laterality: Left;  . CHOLECYSTECTOMY    . COLONOSCOPY  12/12/2003   RMR: Normal rectum.  Long capacious tortuous colon, colonic mucosa appeared normal  . COLONOSCOPY N/A 03/21/2014   Dr. [name]: internal and external hemorrhoids, torturous colon, colonic diverticulosis   . ESOPHAGOGASTRODUODENOSCOPY  12/28/2001   [id number] sliding hiatal hernia/. Three small bulbar ulcers, two with stigmata of bleeding and these were coagulated using heater probe unit   . ESOPHAGOGASTRODUODENOSCOPY N/A 03/21/2014   Dr. [name]: cervical esophageal web s/p dilation, negative H.pylori   .  EYE SURGERY    . HIP FRACTURE SURGERY  2008  . JOINT REPLACEMENT     Rt hip, ????? sounds like just for fracture  . MALONEY DILATION N/A 03/21/2014   Procedure: [name] DILATION;  Surgeon: [name], MD;  Location: [facility] ENDO SUITE;   Service: Endoscopy;  Laterality: N/A;  . SAVORY DILATION N/A 03/21/2014   Procedure: SAVORY DILATION;  Surgeon: [name], MD;  Location: [facility] ENDO SUITE;  Service: Endoscopy;  Laterality: N/A;     OB History   No obstetric history on file.     Family History  Problem Relation Age of Onset  . Crohn's disease Daughter   . Hypertension Mother   . Hypertension Sister   . Diabetes Brother   . Diabetes Sister   . Diabetes Sister   . Heart attack Sister   . Colon cancer Neg Hx     Social History   Tobacco Use  . Smoking status: Never Smoker  . Smokeless tobacco: Never Used  Vaping Use  . Vaping Use: Never used  Substance Use Topics  . Alcohol use: No  . Drug use: No    Home Medications Prior to Admission medications   Medication Sig Start Date End Date Taking? Authorizing Provider  acetaminophen (TYLENOL) 325 MG tablet Take 2 tablets (650 mg total) by mouth every 6 (six) hours as needed for mild pain, fever or headache (or Fever >/= 101). 07/31/19   Roxan Hockey, MD  albuterol (VENTOLIN HFA) 108 (90 Base) MCG/ACT inhaler Inhale 2 puffs into the lungs every 6 (six) hours as needed for wheezing or shortness of breath. 02/12/20   Roxan Hockey, MD  apixaban (ELIQUIS) 2.5 MG TABS tablet Take 1 tablet (2.5 mg total) by mouth 2 (two) times daily. 07/31/19   Roxan Hockey, MD  ascorbic acid (VITAMIN C) 500 MG tablet Take 1 tablet (500 mg total) by mouth daily. 02/13/20   Roxan Hockey, MD  dexamethasone (DECADRON) 6 MG tablet Take 1 tablet (6 mg total) by mouth daily with breakfast. 02/12/20   Roxan Hockey, MD  famotidine (PEPCID) 20 MG tablet Take 1 tablet (20 mg total) by mouth daily. 01/04/20   Barton Dubois, MD  gabapentin (NEURONTIN) 300 MG capsule Take 300 mg by mouth 2 (two) times daily.  02/22/14   [provider]  guaiFENesin-dextromethorphan (ROBITUSSIN DM) 100-10 MG/5ML syrup Take 10 mLs by mouth every 4 (four) hours as needed for cough. 02/12/20    Roxan Hockey, MD  hydrALAZINE (APRESOLINE) 50 MG tablet Take 1.5 tablets (75 mg total) by mouth 2 (two) times daily. 06/23/19 10/08/20  Herminio Commons, MD  hydrocortisone (ANUSOL-HC) 2.5 % rectal cream Place 1 application rectally 2 (two) times daily as needed for hemorrhoids or anal itching. 01/03/20   Barton Dubois, MD  insulin aspart (NOVOLOG) 100 UNIT/ML FlexPen Inject 0-10 Units into the skin 3 (three) times daily with meals. insulin aspart (novoLOG) injection 0-10 Units 0-10 Units Subcutaneous, 3 times daily with meals CBG < 70: Implement Hypoglycemia Standing Orders and refer to Hypoglycemia Standing Orders sidebar report  CBG 70 - 120: 0 unit CBG 121 - 150: 0 unit  CBG 151 - 200: 1 unit CBG 201 - 250: 2 units CBG 251 - 300: 4 units CBG 301 - 350: 6 units  CBG 351 - 400: 8 units  CBG > 400: 10 units 07/31/19   Emokpae, Courage, MD  insulin detemir (LEVEMIR) 100 UNIT/ML injection Inject 0.8 mLs (80 Units total) into the  skin at bedtime. 02/12/20   Roxan Hockey, MD  isosorbide mononitrate (IMDUR) 60 MG 24 hr tablet Take 1 tablet (60 mg total) by mouth daily. 02/12/20 02/11/21  Roxan Hockey, MD  ondansetron (ZOFRAN) 4 MG tablet Take 4 mg by mouth every 6 (six) hours as needed for nausea or vomiting.    [provider]  potassium chloride SA (KLOR-CON) 20 MEQ tablet Take 1 tablet (20 mEq total) by mouth daily. 04/26/20   Verta Ellen., NP  simvastatin (ZOCOR) 20 MG tablet Take 20 mg by mouth at bedtime.    [provider]  torsemide (DEMADEX) 20 MG tablet Take 1 tablet (20 mg total) by mouth daily. 02/12/20   Roxan Hockey, MD  zinc sulfate 220 (50 Zn) MG capsule Take 1 capsule (220 mg total) by mouth daily. 02/13/20   Roxan Hockey, MD    Allergies    Patient has no known allergies.  Review of Systems   Review of Systems  Constitutional: Positive for activity change.  Eyes: Negative for visual disturbance.  Respiratory: Negative for shortness of breath.    Cardiovascular: Negative for chest pain.  Gastrointestinal: Negative for nausea and vomiting.  Musculoskeletal: Positive for neck pain.  Neurological: Positive for headaches. Negative for dizziness, seizures, syncope, speech difficulty, weakness, light-headedness and numbness.  All other systems reviewed and are negative.   Physical Exam Updated Vital Signs BP (!) 167/48   Pulse 82   Temp 98.1 F (36.7 C) (Oral)   Resp 20   Ht '5\' 8"'$  (1.727 m)   Wt 90.7 kg   SpO2 96%   BMI 30.41 kg/m   Physical Exam Vitals and nursing note reviewed.  Constitutional:      Appearance: She is well-developed.  HENT:     Head: Normocephalic and atraumatic.  Eyes:     General: No visual field deficit.    Extraocular Movements: Extraocular movements intact.     Pupils: Pupils are equal, round, and reactive to light.  Neck:     Comments: No meningismus Cardiovascular:     Rate and Rhythm: Normal rate.  Pulmonary:     Effort: Pulmonary effort is normal.  Abdominal:     General: Bowel sounds are normal.  Musculoskeletal:     Cervical back: Normal range of motion and neck supple.  Skin:    General: Skin is warm and dry.  Neurological:     Mental Status: She is alert and oriented to person, place, and time.     GCS: GCS eye subscore is 4. GCS verbal subscore is 5. GCS motor subscore is 6.     Cranial Nerves: No cranial nerve deficit, dysarthria or facial asymmetry.     Sensory: No sensory deficit.     Comments: Unable to lift lower extremities     ED Results / Procedures / Treatments   Labs (all labs ordered are listed, but only abnormal results are displayed) Labs Reviewed  CBC WITH DIFFERENTIAL/PLATELET - Abnormal; Notable for the following components:      Result Value   Neutro Abs 7.9 (*)    All other components within normal limits  I-STAT CHEM 8, ED - Abnormal; Notable for the following components:   Potassium 2.3 (*)    Chloride 122 (*)    Glucose, Bld 217 (*)    Calcium,  Ion 0.53 (*)    TCO2 15 (*)    Hemoglobin 7.1 (*)    HCT 21.0 (*)    All other components  within normal limits  RESP PANEL BY RT-PCR (FLU A&B, COVID) ARPGX2  PROTIME-INR  BASIC METABOLIC PANEL  BRAIN NATRIURETIC PEPTIDE  TYPE AND SCREEN    EKG None  Radiology CT Angio Head W or Wo Contrast  Result Date: 09/24/2020 CLINICAL DATA:  Initial evaluation for acute intracranial hemorrhage. EXAM: CT ANGIOGRAPHY HEAD AND NECK TECHNIQUE: Multidetector CT imaging of the head and neck was performed using the standard protocol during bolus administration of intravenous contrast. Multiplanar CT image reconstructions and MIPs were obtained to evaluate the vascular anatomy. Carotid stenosis measurements (when applicable) are obtained utilizing NASCET criteria, using the distal internal carotid diameter as the denominator. CONTRAST:  72m OMNIPAQUE IOHEXOL 350 MG/ML SOLN COMPARISON:  Prior head CT from earlier the same day. FINDINGS: CTA NECK FINDINGS Aortic arch: Visualized aortic arch normal in caliber. Origin of the great vessels incompletely assessed on this exam. Atheromatous change about the origin of the left subclavian artery without significant stenosis. Right carotid system: Right CCA patent from its origin to the bifurcation without stenosis. Scattered calcified plaque about the right carotid bulb without significant stenosis. Right ICA patent distally without stenosis, dissection or occlusion. Left carotid system: Origin of the left CCA not visualized. Visualized left CCA patent to the bifurcation without stenosis. Eccentric calcified plaque at the left carotid bulb without significant stenosis. Left ICA patent distally without stenosis, dissection or occlusion. Vertebral arteries: Both vertebral arteries arise from subclavian arteries. Right vertebral artery dominant. Vertebral arteries patent within the neck without stenosis, dissection or occlusion. Skeleton: No visible acute osseous abnormality. No  discrete or worrisome osseous lesions. 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 Other neck: No other visible acute soft tissue abnormality within the neck. Enlarged multinodular goiter, with dominant 2.6 cm right thyroid nodule. This has been evaluated on previous imaging in 2013. (ref: J Am Coll Radiol. 2015 Feb;12(2): 143-50). No other mass or adenopathy. Upper chest: Diffuse interlobular septal thickening seen within the visualized lungs, suggesting pulmonary interstitial edema. Visualized upper chest demonstrates no other acute finding. Review of the MIP images confirms the above findings CTA HEAD FINDINGS Anterior circulation: Petrous segments patent bilaterally. Extensive atheromatous change throughout the carotid siphons with associated moderate to advanced diffuse narrowing. A1 segments patent bilaterally. Normal anterior communicating artery complex. Anterior cerebral arteries patent to their distal aspects without stenosis. No M1 stenosis or occlusion. Normal MCA bifurcations. Distal MCA branches well perfused and symmetric. Posterior circulation: Atheromatous change within the mid V4 segments bilaterally with associated moderate stenoses. Left PICA origin patent and normal. Right PICA not seen. Basilar patent to its distal aspect without stenosis. Superior cerebellar arteries patent bilaterally. Left PCA supplied via the basilar. Predominant fetal type origin of the right PCA. PCAs patent to their distal aspects without stenosis. Venous sinuses: Patent allowing for timing the contrast bolus. Anatomic variants: Predominant fetal type origin of the right PCA. No intracranial aneurysm. No vascular abnormality seen underlying the hemorrhages at the pons and cerebellum. Review of the MIP images confirms the above findings IMPRESSION: 1. Negative CTA for large vessel occlusion. No vascular abnormality seen underlying the hemorrhages at the pons and cerebellum. 2. Extensive atheromatous change  throughout the carotid siphons with associated moderate to advanced diffuse narrowing. 3. Atheromatous change about the V4 segments bilaterally with associated moderate stenoses. 4. Diffuse interlobular septal thickening within the visualized lungs, suggesting pulmonary interstitial edema. Electronically Signed   By: BJeannine BogaM.D.   On: 09/24/2020 21:57   CT Head Wo  Contrast  Addendum Date: 09/24/2020   ADDENDUM REPORT: 09/24/2020 20:39 ADDENDUM: The patient was subsequently sedated and return for repeat imaging which demonstrates evidence of a parenchymal hemorrhage in the right half of the pons with suggestion of local extension into the prepontine cistern. The density seen in the midline posteriorly may represent some dependent spread of hemorrhage although some artifact does remain. Critical Value/emergent results were called by telephone at the time of interpretation on 09/24/2020 at 8:39 pm to Dr. Varney Biles , who verbally acknowledged these results. Electronically Signed   By: Inez Catalina M.D.   On: 09/24/2020 20:39   Result Date: 09/24/2020 CLINICAL DATA:  Headaches, no known injury, initial encounter EXAM: CT HEAD WITHOUT CONTRAST TECHNIQUE: Contiguous axial images were obtained from the base of the skull through the vertex without intravenous contrast. COMPARISON:  05/02/2008 FINDINGS: Brain: Images are significantly limited by patient motion artifact. Mild atrophic changes and chronic white matter ischemic changes are seen. There are areas of increased attenuation anterior to the pons as well as posteriorly in the midline in the cerebellum. These are likely related to artifact although the possibility of underlying hemorrhage could not be totally excluded on this exam. Repeat imaging when the patient can better tolerate the exam is recommended. Vascular: No hyperdense vessel or unexpected calcification. Skull: Normal. Negative for fracture or focal lesion. Sinuses/Orbits: No acute  finding. Other: None. IMPRESSION: Significantly limited exam. There are findings suspicious for hemorrhage in the posterior fossa and given the clinical history repeat imaging with sedation is recommended to allow the patient to better tolerate the procedure. No other focal abnormality is noted. Electronically Signed: By: Inez Catalina M.D. On: 09/24/2020 19:55   CT Angio Neck W and/or Wo Contrast  Result Date: 09/24/2020 CLINICAL DATA:  Initial evaluation for acute intracranial hemorrhage. EXAM: CT ANGIOGRAPHY HEAD AND NECK TECHNIQUE: Multidetector CT imaging of the head and neck was performed using the standard protocol during bolus administration of intravenous contrast. Multiplanar CT image reconstructions and MIPs were obtained to evaluate the vascular anatomy. Carotid stenosis measurements (when applicable) are obtained utilizing NASCET criteria, using the distal internal carotid diameter as the denominator. CONTRAST:  88m OMNIPAQUE IOHEXOL 350 MG/ML SOLN COMPARISON:  Prior head CT from earlier the same day. FINDINGS: CTA NECK FINDINGS Aortic arch: Visualized aortic arch normal in caliber. Origin of the great vessels incompletely assessed on this exam. Atheromatous change about the origin of the left subclavian artery without significant stenosis. Right carotid system: Right CCA patent from its origin to the bifurcation without stenosis. Scattered calcified plaque about the right carotid bulb without significant stenosis. Right ICA patent distally without stenosis, dissection or occlusion. Left carotid system: Origin of the left CCA not visualized. Visualized left CCA patent to the bifurcation without stenosis. Eccentric calcified plaque at the left carotid bulb without significant stenosis. Left ICA patent distally without stenosis, dissection or occlusion. Vertebral arteries: Both vertebral arteries arise from subclavian arteries. Right vertebral artery dominant. Vertebral arteries patent within the neck  without stenosis, dissection or occlusion. Skeleton: No visible acute osseous abnormality. No discrete or worrisome osseous lesions. 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 Other neck: No other visible acute soft tissue abnormality within the neck. Enlarged multinodular goiter, with dominant 2.6 cm right thyroid nodule. This has been evaluated on previous imaging in 2013. (ref: J Am Coll Radiol. 2015 Feb;12(2): 143-50). No other mass or adenopathy. Upper chest: Diffuse interlobular septal thickening seen within the visualized lungs, suggesting  pulmonary interstitial edema. Visualized upper chest demonstrates no other acute finding. Review of the MIP images confirms the above findings CTA HEAD FINDINGS Anterior circulation: Petrous segments patent bilaterally. Extensive atheromatous change throughout the carotid siphons with associated moderate to advanced diffuse narrowing. A1 segments patent bilaterally. Normal anterior communicating artery complex. Anterior cerebral arteries patent to their distal aspects without stenosis. No M1 stenosis or occlusion. Normal MCA bifurcations. Distal MCA branches well perfused and symmetric. Posterior circulation: Atheromatous change within the mid V4 segments bilaterally with associated moderate stenoses. Left PICA origin patent and normal. Right PICA not seen. Basilar patent to its distal aspect without stenosis. Superior cerebellar arteries patent bilaterally. Left PCA supplied via the basilar. Predominant fetal type origin of the right PCA. PCAs patent to their distal aspects without stenosis. Venous sinuses: Patent allowing for timing the contrast bolus. Anatomic variants: Predominant fetal type origin of the right PCA. No intracranial aneurysm. No vascular abnormality seen underlying the hemorrhages at the pons and cerebellum. Review of the MIP images confirms the above findings IMPRESSION: 1. Negative CTA for large vessel occlusion. No vascular abnormality  seen underlying the hemorrhages at the pons and cerebellum. 2. Extensive atheromatous change throughout the carotid siphons with associated moderate to advanced diffuse narrowing. 3. Atheromatous change about the V4 segments bilaterally with associated moderate stenoses. 4. Diffuse interlobular septal thickening within the visualized lungs, suggesting pulmonary interstitial edema. Electronically Signed   By: Jeannine Boga M.D.   On: 09/24/2020 21:57   DG Chest Port 1 View  Result Date: 09/24/2020 CLINICAL DATA:  Shortness of breath cough EXAM: PORTABLE CHEST 1 VIEW COMPARISON:  02/10/2020 FINDINGS: Cardiac shadow remains enlarged. Mild vascular congestion is noted although improved when compared with the prior exam. No sizable effusion is noted. No pneumothorax is seen. No bony abnormality is noted. IMPRESSION: Mild CHF although improved from the prior study. Electronically Signed   By: Inez Catalina M.D.   On: 09/24/2020 18:49    Procedures .Critical Care Performed by: Varney Biles, MD Authorized by: Varney Biles, MD   Critical care provider statement:    Critical care time (minutes):  105   Critical care was necessary to treat or prevent imminent or life-threatening deterioration of the following conditions:  CNS failure or compromise   Critical care was time spent personally by me on the following activities:  Discussions with consultants, evaluation of patient's response to treatment, examination of patient, ordering and performing treatments and interventions, ordering and review of laboratory studies, ordering and review of radiographic studies, pulse oximetry, re-evaluation of patient's condition, obtaining history from patient or surrogate and review of old charts     Medications Ordered in ED Medications  midazolam (VERSED) injection 2 mg (2 mg Intravenous Not Given 09/24/20 2058)  clevidipine (CLEVIPREX) infusion 0.5 mg/mL (16 mg/hr Intravenous Rate/Dose Change 09/24/20  2218)  metoCLOPramide (REGLAN) injection 10 mg (10 mg Intravenous Given 09/24/20 1824)  iohexol (OMNIPAQUE) 350 MG/ML injection 75 mL (75 mLs Intravenous Contrast Given 09/24/20 2051)  coag fact Xa recombinant (ANDEXXA) low dose infusion 900 mg (900 mg Intravenous New Bag/Given 09/24/20 2141)    ED Course  I have reviewed the triage vital signs and the nursing notes.  Pertinent labs & imaging results that were available during my care of the patient were reviewed by me and considered in my medical decision making (see chart for details).  Clinical Course as of 09/24/20 2312  Tue Sep 24, 2020  2044 Initial CT was equivocal due to artifact.  Repeat CT concluded. Concerns for pontine bleed. Spoke with Dr. Leonel Ramsay - he will review the imaging and call me back, has requested CT-A to be ordered. [AN]  2152 [place] ordered for Eliquis reversal. Cleviprex started with goal SBP of 140. Neurochecks ordered.  Neuro ICU excepting at [facility]. Care link contacted. [AN]    Clinical Course User Index [AN] Varney Biles, MD   MDM Rules/Calculators/A&P                          Sudden onset severe headache. Confirmed with daughter. Noted to be quite hypertensive.  Concerns for [facility], [facility] due to hypertensive emergency. Dissection also considered due to neck pain.  During my assessment, she reports feeling better, which is reassuring. Her gross neuro exam is reassuring.   Final Clinical Impression(s) / ED Diagnoses Final diagnoses:  Nontraumatic intracerebral hemorrhage, unspecified cerebral location, unspecified laterality [facility])  Hypertensive emergency  Acute congestive heart failure, unspecified heart failure type [facility])    Rx / DC Orders ED Discharge Orders    None       Varney Biles, MD 09/24/20 2312

## 2020-09-24 NOTE — ED Notes (Signed)
Put pt on purewick

## 2020-09-24 NOTE — ED Triage Notes (Signed)
Pt brought in by [facility] from home. EMS reports CBG 360, BP 250/120. Pt c/o headache 8/10 and nausea. Denies SOB.

## 2020-09-25 ENCOUNTER — Inpatient Hospital Stay (HOSPITAL_COMMUNITY): Payer: Medicare HMO

## 2020-09-25 DIAGNOSIS — I161 Hypertensive emergency: Secondary | ICD-10-CM

## 2020-09-25 DIAGNOSIS — I613 Nontraumatic intracerebral hemorrhage in brain stem: Principal | ICD-10-CM

## 2020-09-25 DIAGNOSIS — I6389 Other cerebral infarction: Secondary | ICD-10-CM

## 2020-09-25 LAB — URINALYSIS, ROUTINE W REFLEX MICROSCOPIC
Bilirubin Urine: NEGATIVE
Glucose, UA: >=500 mg/dL — AB
Hgb urine dipstick: NEGATIVE
Ketones, ur: NEGATIVE mg/dL
Leukocytes,Ua: NEGATIVE
Nitrite: NEGATIVE
Protein, ur: 100 mg/dL — AB
Specific Gravity, Urine: 1.024 (ref 1.005–1.030)
pH: 5 (ref 5.0–8.0)

## 2020-09-25 LAB — GLUCOSE, CAPILLARY
Glucose-Capillary: 158 mg/dL — ABNORMAL HIGH (ref 70–99)
Glucose-Capillary: 174 mg/dL — ABNORMAL HIGH (ref 70–99)
Glucose-Capillary: 177 mg/dL — ABNORMAL HIGH (ref 70–99)
Glucose-Capillary: 196 mg/dL — ABNORMAL HIGH (ref 70–99)
Glucose-Capillary: 209 mg/dL — ABNORMAL HIGH (ref 70–99)
Glucose-Capillary: 236 mg/dL — ABNORMAL HIGH (ref 70–99)
Glucose-Capillary: 254 mg/dL — ABNORMAL HIGH (ref 70–99)
Glucose-Capillary: 309 mg/dL — ABNORMAL HIGH (ref 70–99)
Glucose-Capillary: 378 mg/dL — ABNORMAL HIGH (ref 70–99)
Glucose-Capillary: 416 mg/dL — ABNORMAL HIGH (ref 70–99)
Glucose-Capillary: 430 mg/dL — ABNORMAL HIGH (ref 70–99)
Glucose-Capillary: 431 mg/dL — ABNORMAL HIGH (ref 70–99)
Glucose-Capillary: 446 mg/dL — ABNORMAL HIGH (ref 70–99)

## 2020-09-25 LAB — MAGNESIUM: Magnesium: 2.3 mg/dL (ref 1.7–2.4)

## 2020-09-25 LAB — BASIC METABOLIC PANEL
Anion gap: 11 (ref 5–15)
BUN: 27 mg/dL — ABNORMAL HIGH (ref 8–23)
CO2: 25 mmol/L (ref 22–32)
Calcium: 8.5 mg/dL — ABNORMAL LOW (ref 8.9–10.3)
Chloride: 101 mmol/L (ref 98–111)
Creatinine, Ser: 1.93 mg/dL — ABNORMAL HIGH (ref 0.44–1.00)
GFR, Estimated: 25 mL/min — ABNORMAL LOW (ref >60)
Glucose, Bld: 424 mg/dL — ABNORMAL HIGH (ref 70–99)
Potassium: 4.1 mmol/L (ref 3.5–5.1)
Sodium: 137 mmol/L (ref 135–145)

## 2020-09-25 LAB — RAPID URINE DRUG SCREEN, HOSP PERFORMED
Amphetamines: NOT DETECTED
Barbiturates: NOT DETECTED
Benzodiazepines: NOT DETECTED
Cocaine: NOT DETECTED
Opiates: NOT DETECTED
Tetrahydrocannabinol: NOT DETECTED

## 2020-09-25 LAB — ECHOCARDIOGRAM COMPLETE
Area-P 1/2: 3.72 cm2
Height: 68 in
S' Lateral: 3.4 cm
Weight: 3200 oz

## 2020-09-25 LAB — MRSA PCR SCREENING: MRSA by PCR: NEGATIVE

## 2020-09-25 LAB — TYPE AND SCREEN
ABO/RH(D): A POS
Antibody Screen: NEGATIVE

## 2020-09-25 MED ORDER — HYDRALAZINE HCL 50 MG PO TABS
75.0000 mg | ORAL_TABLET | Freq: Two times a day (BID) | ORAL | Status: DC
  Administered 2020-09-25 – 2020-09-28 (×7): 75 mg via ORAL
  Filled 2020-09-25 (×7): qty 1

## 2020-09-25 MED ORDER — ISOSORBIDE MONONITRATE ER 60 MG PO TB24
60.0000 mg | ORAL_TABLET | Freq: Every day | ORAL | Status: DC
  Administered 2020-09-25 – 2020-09-30 (×6): 60 mg via ORAL
  Filled 2020-09-25 (×2): qty 2
  Filled 2020-09-25 (×4): qty 1

## 2020-09-25 MED ORDER — INSULIN REGULAR(HUMAN) IN NACL 100-0.9 UT/100ML-% IV SOLN
INTRAVENOUS | Status: DC
  Administered 2020-09-25: 5 [IU]/h via INTRAVENOUS
  Filled 2020-09-25 (×2): qty 100

## 2020-09-25 MED ORDER — INSULIN ASPART 100 UNIT/ML IJ SOLN: 0.0000 [IU] | Freq: Three times a day (TID) | INTRAMUSCULAR | Status: DC

## 2020-09-25 MED ORDER — TORSEMIDE 20 MG PO TABS
20.0000 mg | ORAL_TABLET | Freq: Every day | ORAL | Status: DC
  Filled 2020-09-25: qty 1

## 2020-09-25 MED ORDER — DEXTROSE 50 % IV SOLN: 0.0000 mL | INTRAVENOUS | Status: DC | PRN

## 2020-09-25 MED ORDER — FUROSEMIDE 10 MG/ML IJ SOLN
40.0000 mg | Freq: Three times a day (TID) | INTRAMUSCULAR | Status: DC
  Administered 2020-09-25: 40 mg via INTRAVENOUS
  Filled 2020-09-25: qty 4

## 2020-09-25 NOTE — Progress Notes (Signed)
  PCCM Interval Notes    46 yoF admitted overnight after sudden onset of HA yesterday.  Patient reports several missed doses of HTN meds.  She is on Eliquis s/p reversal with andexxa    Since consult this am, please see Dr. Arvil Persons note, patient is weaned down to 1L [place] and no complaints or neuro changes.  She has passed her bedside SLP with plans by Neurology to restart home hypertensive's.  Denies SOB.    Remains on cleviprex 18 mg/ hr for SBP goal 120-140 for now  Noted increasing glucose, 424 on BMET sCr 1.58-> 1.93 UOP 275 m/hr thus far, ~0.3 ml/kg/hr Afebrile    Vitals:   09/25/20 0700 09/25/20 0800  BP: (!) 144/53   Pulse: 79   Resp: (!) 21   Temp:  98.4 F (36.9 C)  SpO2: 98%    General:  Pleasant older female lying in bed in NAD HEENT: MM pink/moist, R pupil 3/reactive, L irregular (previous cataract sugery) Neuro: Alert, oriented, MAE except RLE which is 2-3/5 CV: currently in NSR, rr, +2 dp PULM:  Non labored, speaking full sentences, lungs clear anteriorly, diminished in bases, weaned down to 1L [place] GI: obese, +bs, soft, NT, [place] Extremities: warm/dry, +LE  edema  Skin: no rashes    A/P:  Acute hemorrhagic pontine hemorrhage Mild pulmonary edema - resolved  Decompensated congestive heart failure-mild Lower extremity edema Hypertension Diabetes mellitus type 2- uncontrolled  AKI likely due to hypertension  - currently with no neuro deficits; previous RLE deficits from prior CVA in 2003 in which she uses a walker at baseline.   P:  - per Neurology who is primary  - continue cleviprex for SBP goal 120-140 per neurology for first 24hrs, then likely liberated to SBP goal < 160 - passed bedside SLP, therefore restarting home HTN meds  - change SSI to insulin gtt  (will need higher doses of SSI/ lantus transitioning off based on home doses) - stop lasix for now - pending TTE - check UA and EKG  - further stroke workup per Neurology  - trending renal  indices   PCCM will continue to follow.      Additional CCT: Brutus, ACNP [facility] 09/25/2020, 9:58 AM

## 2020-09-25 NOTE — Progress Notes (Signed)
PT Cancellation Note  Patient Details Name: [name] MRN: [id number] DOB: [birth year]   Cancelled Treatment:    Reason Eval/Treat Not Completed: Active bedrest order   Wyona Almas, PT, DPT Acute Rehabilitation Services Pager [phone] Office [phone]    Deno Etienne 09/25/2020, 7:17 AM

## 2020-09-25 NOTE — Consult Note (Signed)
Physical Medicine and Rehabilitation Consult Reason for Consult: Headache with slurred speech Referring Physician: Dr. Leonel Ramsay   HPI: [name] is a 86 y.o. right-handed female with history of previous stroke with right hemiparesis, chronic atrial fibrillation maintained on Eliquis, diabetes mellitus, diastolic congestive heart failure, CKD stage III.  Per chart review patient lives with her children.  1 level home ramped entrance.  Use a rolling walker prior to admission as well as needing assistance with ADLs.  Presented 09/24/2020 with acute onset of headache as well as slurred speech.  Denied any double vision.  While in the ED noted some shortness of breath pulmonary edema noted on chest x-ray BiPAP initiated.  Cranial CT scan suspicious for hemorrhage in the posterior fossa.  CT angiogram of head and neck negative for large vessel occlusion.  No vascular abnormalities.  MRI of the brain redemonstrates acute intraparenchymal hemorrhage within the right side of the pons and within the inferior cerebellar vermis.  Small amount of subarachnoid blood in the perimesencephalic cistern, interfoliar subarachnoid spaces of the posterior fossa independent within the occipital horns of the lateral ventricles.  No hydrocephalus.  Admission chemistries unremarkable except potassium 2.3, glucose 217, creatinine 1.58, urine drug screen negative.  Echocardiogram with ejection fraction of 55 to [id number] grade 2 diastolic dysfunction..  Maintained on Cleviprex for blood pressure control.  Patient did receive Andexxa to reverse Eliquis.  Maintained on a regular consistency diet.  Due to patient decreased functional ability recommendations of physical medicine rehab consult.   Review of Systems  Constitutional: Negative for chills and fever.  HENT: Negative for hearing loss.   Eyes: Negative for blurred vision and double vision.  Respiratory: Negative for shortness of breath.   Cardiovascular: Negative for  chest pain and palpitations.  Gastrointestinal: Positive for constipation. Negative for heartburn and nausea.       GERD  Genitourinary: Negative for dysuria and flank pain.  Musculoskeletal: Positive for back pain and myalgias.  Skin: Negative for rash.  Neurological: Positive for speech change and headaches.  All other systems reviewed and are negative.  Past Medical History:  Diagnosis Date  . Arthritis   . CHF (congestive heart failure) ([place])   . Chronic diastolic heart failure ([place])   . CKD (chronic kidney disease) stage 3, GFR 30-59 ml/min (H[facility])   . Essential hypertension   . GERD (gastroesophageal reflux disease)   . Hemorrhoids   . History of stroke   . Type 2 diabetes mellitus ([place])    Past Surgical History:  Procedure Laterality Date  . BACK SURGERY    . CATARACT EXTRACTION W/PHACO Left 02/28/2013   Procedure: CATARACT EXTRACTION PHACO AND INTRAOCULAR LENS PLACEMENT (IOL) CDE=15.60;  Surgeon: Elta Guadeloupe T. Gershon Crane, MD;  Location: [facility];  Service: Ophthalmology;  Laterality: Left;  . CHOLECYSTECTOMY    . COLONOSCOPY  12/12/2003   RMR: Normal rectum.  Long capacious tortuous colon, colonic mucosa appeared normal  . COLONOSCOPY N/A 03/21/2014   Dr. [name]: internal and external hemorrhoids, torturous colon, colonic diverticulosis   . ESOPHAGOGASTRODUODENOSCOPY  12/28/2001   [id number] sliding hiatal hernia/. Three small bulbar ulcers, two with stigmata of bleeding and these were coagulated using heater probe unit   . ESOPHAGOGASTRODUODENOSCOPY N/A 03/21/2014   Dr. [name]: cervical esophageal web s/p dilation, negative H.pylori   . EYE SURGERY    . HIP FRACTURE SURGERY  2008  . JOINT REPLACEMENT     Rt hip, ????? sounds like just for fracture  .  MALONEY DILATION N/A 03/21/2014   Procedure: [name] DILATION;  Surgeon: [name], MD;  Location: [facility];  Service: Endoscopy;  Laterality: N/A;  . SAVORY DILATION N/A 03/21/2014   Procedure: SAVORY DILATION;  Surgeon: [name], MD;  Location: [facility];  Service: Endoscopy;  Laterality: N/A;   Family History  Problem Relation Age of Onset  . Crohn's disease Daughter   . Hypertension Mother   . Hypertension Sister   . Diabetes Brother   . Diabetes Sister   . Diabetes Sister   . Heart attack Sister   . Colon cancer Neg Hx    Social History:  reports that she has never smoked. She has never used smokeless tobacco. She reports that she does not drink alcohol and does not use drugs. Allergies: No Known Allergies Medications Prior to Admission  Medication Sig Dispense Refill  . acetaminophen (TYLENOL) 325 MG tablet Take 2 tablets (650 mg total) by mouth every 6 (six) hours as needed for mild pain, fever or headache (or Fever >/= 101). 12 tablet 0  . albuterol (VENTOLIN HFA) 108 (90 Base) MCG/ACT inhaler Inhale 2 puffs into the lungs every 6 (six) hours as needed for wheezing or shortness of breath. 18 g 0  . apixaban (ELIQUIS) 2.5 MG TABS tablet Take 1 tablet (2.5 mg total) by mouth 2 (two) times daily. 60 tablet 2  . ascorbic acid (VITAMIN C) 500 MG tablet Take 1 tablet (500 mg total) by mouth daily. 30 tablet 1  . dexamethasone (DECADRON) 6 MG tablet Take 1 tablet (6 mg total) by mouth daily with breakfast. 5 tablet 0  . famotidine (PEPCID) 20 MG tablet Take 1 tablet (20 mg total) by mouth daily.    Marland Kitchen gabapentin (NEURONTIN) 300 MG capsule Take 300 mg by mouth 2 (two) times daily.     Marland Kitchen guaiFENesin-dextromethorphan (ROBITUSSIN DM) 100-10 MG/5ML syrup Take 10 mLs by mouth every 4 (four) hours as needed for cough. 118 mL 0  . hydrALAZINE (APRESOLINE) 50 MG tablet Take 1.5 tablets (75 mg total) by mouth 2 (two) times daily. 90 tablet 11  . hydrocortisone (ANUSOL-HC) 2.5 % rectal cream Place 1 application rectally 2 (two) times daily as needed for hemorrhoids or anal itching.    . insulin aspart (NOVOLOG) 100 UNIT/ML FlexPen Inject 0-10 Units into the skin 3 (three) times daily with meals. insulin aspart  (novoLOG) injection 0-10 Units 0-10 Units Subcutaneous, 3 times daily with meals CBG < 70: Implement Hypoglycemia Standing Orders and refer to Hypoglycemia Standing Orders sidebar report  CBG 70 - 120: 0 unit CBG 121 - 150: 0 unit  CBG 151 - 200: 1 unit CBG 201 - 250: 2 units CBG 251 - 300: 4 units CBG 301 - 350: 6 units  CBG 351 - 400: 8 units  CBG > 400: 10 units 15 mL 11  . insulin detemir (LEVEMIR) 100 UNIT/ML injection Inject 0.8 mLs (80 Units total) into the skin at bedtime. 10 mL 2  . isosorbide mononitrate (IMDUR) 60 MG 24 hr tablet Take 1 tablet (60 mg total) by mouth daily.    . ondansetron (ZOFRAN) 4 MG tablet Take 4 mg by mouth every 6 (six) hours as needed for nausea or vomiting.    . potassium chloride SA (KLOR-CON) 20 MEQ tablet Take 1 tablet (20 mEq total) by mouth daily. 90 tablet 0  . simvastatin (ZOCOR) 20 MG tablet Take 20 mg by mouth at bedtime.    Marland Kitchen  torsemide (DEMADEX) 20 MG tablet Take 1 tablet (20 mg total) by mouth daily. 30 tablet 3  . zinc sulfate 220 (50 Zn) MG capsule Take 1 capsule (220 mg total) by mouth daily. 30 capsule 0    Home: Home Living Family/patient expects to be discharged to:: Private residence Living Arrangements: Children Available Help at Discharge: Family,Available 24 hours/day Type of Home: House Home Access: Ramped entrance Home Layout: One level Bathroom Shower/Tub: Multimedia programmer: Handicapped height Bathroom Accessibility: Yes Home Equipment: Environmental consultant - 2 wheels,Bedside commode,Wheelchair - manual,Shower seat,Toilet riser,Tub bench,Hospital bed (states she needs a new w/c) Additional Comments: sleeps in recliner, gets up from [facility] elevated position  Functional History: Prior Function Level of Independence: Needs assistance Gait / Transfers Assistance Needed: RW; very old w/c ADL's / [place] Needed: ADL by pt; iADL from family; cooking, Functional Status:  Mobility: Bed Mobility Overal bed mobility: Needs  Assistance Bed Mobility: Supine to Sit,Sit to Supine Supine to sit: Max assist,+2 for physical assistance Sit to supine: Max assist,+2 for physical assistance General bed mobility comments: Pt able to move BLE's minimally, ultimately requiring assist to progress off edge of bed and trunk to upright. Use of bed pad to scoot hips forward. Assist for trunk guidance and BLE elevation back into bed Transfers Overall transfer level: Needs assistance Equipment used: Rolling walker (2 wheeled) Transfers: Sit to/from Stand Sit to Stand: Max assist,+2 physical assistance General transfer comment: maxA + 2 to stand from edge of bed x 3. On first trial, utilized walker, but pt with difficulty transitioning hands to handles. Attempted face to face transfer for second and third trial. Pt able to achieve hip clearance, but signficant trunk flexion and could not achieve upright posture      ADL:    Cognition: Cognition Overall Cognitive Status: Impaired/Different from baseline Orientation Level: Oriented X4 Cognition Arousal/Alertness: Awake/alert Behavior During Therapy: WFL for tasks assessed/performed Overall Cognitive Status: Impaired/Different from baseline Area of Impairment: Safety/judgement,Awareness Safety/Judgement: Decreased awareness of deficits Awareness: Emergent General Comments: Pt thinks it's due to lines attached to her that she is not able to stand  Blood pressure (!) 118/43, pulse 75, temperature 99.7 F (37.6 C), temperature source Oral, resp. rate (!) 23, height '5\' 8"'$  (1.727 m), weight 90.7 kg, SpO2 98 %. Physical Exam Constitutional:      Appearance: She is obese.  HENT:     Head: Normocephalic.     Right Ear: External ear normal.     Left Ear: External ear normal.     Mouth/Throat:     Mouth: Mucous membranes are moist.  Eyes:     Extraocular Movements: Extraocular movements intact.     Pupils: Pupils are equal, round, and reactive to light.  Cardiovascular:      Rate and Rhythm: Normal rate.  Pulmonary:     Effort: Pulmonary effort is normal.  Musculoskeletal:     Cervical back: Normal range of motion.     Comments: Right heel cord contracture  Skin:    General: Skin is warm.  Neurological:     Mental Status: She is alert.     Comments: Patient is awake alert no acute distress.  Makes eye contact with examiner.  Provides name and age.  Follows simple commands. Speech sl dysarthric. Reasonable insight and awareness. RUE 4-5/5. LUE 3+ to 4/5 prox to distal. RLE 2/5 prox to distal with trace movement at right ankle,contracture. LLE 3- prox to 3+/5 distally. No focal sensory abn to LT,  pain noted on either side. DTR's increased RLE     Results for orders placed or performed during the hospital encounter of 09/24/20 (from the past 24 hour(s))  Resp Panel by RT-PCR (Flu A&B, Covid) Nasopharyngeal Swab     Status: None   Collection Time: 09/24/20  8:02 PM   Specimen: Nasopharyngeal Swab; Nasopharyngeal(NP) swabs in vial transport medium  Result Value Ref Range   SARS Coronavirus 2 by RT PCR NEGATIVE NEGATIVE   Influenza A by PCR NEGATIVE NEGATIVE   Influenza B by PCR NEGATIVE NEGATIVE  I-stat chem 8, ED (not at [facility] or [facility])     Status: Abnormal   Collection Time: 09/24/20  8:20 PM  Result Value Ref Range   Sodium 145 135 - 145 mmol/L   Potassium 2.3 (LL) 3.5 - 5.1 mmol/L   Chloride 122 (H) 98 - 111 mmol/L   BUN 18 8 - 23 mg/dL   Creatinine, Ser 0.70 0.44 - 1.00 mg/dL   Glucose, Bld 217 (H) 70 - 99 mg/dL   Calcium, Ion 0.53 (LL) 1.15 - 1.40 mmol/L   TCO2 15 (L) 22 - 32 mmol/L   Hemoglobin 7.1 (L) 12.0 - 15.0 g/dL   HCT 21.0 (L) 36.0 - [id number] %  Basic metabolic panel     Status: Abnormal   Collection Time: 09/24/20  9:37 PM  Result Value Ref Range   Sodium 138 135 - 145 mmol/L   Potassium 3.9 3.5 - 5.1 mmol/L   Chloride 103 98 - 111 mmol/L   CO2 25 22 - 32 mmol/L   Glucose, Bld 345 (H) 70 - 99 mg/dL   BUN 28 (H) 8 - 23 mg/dL   Creatinine,  Ser 1.58 (H) 0.44 - 1.00 mg/dL   Calcium 8.8 (L) 8.9 - 10.3 mg/dL   GFR, Estimated 32 (L) >60 mL/min   Anion gap 10 5 - 15  CBC with Differential     Status: Abnormal   Collection Time: 09/24/20  9:37 PM  Result Value Ref Range   WBC 10.4 4.0 - 10.5 K/uL   RBC 4.40 3.87 - 5.11 MIL/uL   Hemoglobin 12.7 12.0 - 15.0 g/dL   HCT 40.8 36.0 - 46.0 %   MCV 92.7 80.0 - 100.0 fL   MCH 28.9 26.0 - 34.0 pg   MCHC 31.1 30.0 - 36.0 g/dL   RDW 15.1 11.5 - 15.5 %   Platelets 253 150 - 400 K/uL   nRBC 0.0 0.0 - 0.2 %   Neutrophils Relative % 76 %   Neutro Abs 7.9 (H) 1.7 - 7.7 K/uL   Lymphocytes Relative 17 %   Lymphs Abs 1.8 0.7 - 4.0 K/uL   Monocytes Relative 5 %   Monocytes Absolute 0.5 0.1 - 1.0 K/uL   Eosinophils Relative 1 %   Eosinophils Absolute 0.1 0.0 - 0.5 K/uL   Basophils Relative 1 %   Basophils Absolute 0.1 0.0 - 0.1 K/uL   Immature Granulocytes 0 %   Abs Immature Granulocytes 0.04 0.00 - 0.07 K/uL  Protime-INR     Status: None   Collection Time: 09/24/20  9:37 PM  Result Value Ref Range   Prothrombin Time 13.7 11.4 - 15.2 seconds   INR 1.1 0.8 - 1.2  Brain natriuretic peptide     Status: Abnormal   Collection Time: 09/24/20  9:37 PM  Result Value Ref Range   B Natriuretic Peptide 101.0 (H) 0.0 - 100.0 pg/mL  MRSA PCR Screening     Status:  None   Collection Time: 09/25/20 12:38 AM   Specimen: Nasal Mucosa; Nasopharyngeal  Result Value Ref Range   MRSA by PCR NEGATIVE NEGATIVE  Basic metabolic panel     Status: Abnormal   Collection Time: 09/25/20  4:40 AM  Result Value Ref Range   Sodium 137 135 - 145 mmol/L   Potassium 4.1 3.5 - 5.1 mmol/L   Chloride 101 98 - 111 mmol/L   CO2 25 22 - 32 mmol/L   Glucose, Bld 424 (H) 70 - 99 mg/dL   BUN 27 (H) 8 - 23 mg/dL   Creatinine, Ser 1.93 (H) 0.44 - 1.00 mg/dL   Calcium 8.5 (L) 8.9 - 10.3 mg/dL   GFR, Estimated 25 (L) >60 mL/min   Anion gap 11 5 - 15  Type and screen     Status: None   Collection Time: 09/25/20  4:48 AM   Result Value Ref Range   ABO/RH(D) A POS    Antibody Screen NEG    Sample Expiration      09/28/2020,2359 Performed at [facility], [address] [place], [place] [address]   Glucose, capillary     Status: Abnormal   Collection Time: 09/25/20  7:13 AM  Result Value Ref Range   Glucose-Capillary 446 (H) 70 - 99 mg/dL  Urinalysis, Routine w reflex microscopic Urine, Clean Catch     Status: Abnormal   Collection Time: 09/25/20  8:06 AM  Result Value Ref Range   Color, Urine YELLOW YELLOW   APPearance HAZY (A) CLEAR   Specific Gravity, Urine 1.024 1.005 - 1.030   pH 5.0 5.0 - 8.0   Glucose, UA >=500 (A) NEGATIVE mg/dL   Hgb urine dipstick NEGATIVE NEGATIVE   Bilirubin Urine NEGATIVE NEGATIVE   Ketones, ur NEGATIVE NEGATIVE mg/dL   Protein, ur 100 (A) NEGATIVE mg/dL   Nitrite NEGATIVE NEGATIVE   Leukocytes,Ua NEGATIVE NEGATIVE   RBC / HPF 0-5 0 - 5 RBC/hpf   WBC, UA 11-20 0 - 5 WBC/hpf   Bacteria, UA MANY (A) NONE SEEN   Squamous Epithelial / LPF 0-5 0 - 5   Mucus PRESENT   Magnesium     Status: None   Collection Time: 09/25/20  8:11 AM  Result Value Ref Range   Magnesium 2.3 1.7 - 2.4 mg/dL  Urine rapid drug screen (hosp performed)     Status: None   Collection Time: 09/25/20  8:22 AM  Result Value Ref Range   Opiates NONE DETECTED NONE DETECTED   Cocaine NONE DETECTED NONE DETECTED   Benzodiazepines NONE DETECTED NONE DETECTED   Amphetamines NONE DETECTED NONE DETECTED   Tetrahydrocannabinol NONE DETECTED NONE DETECTED   Barbiturates NONE DETECTED NONE DETECTED  Glucose, capillary     Status: Abnormal   Collection Time: 09/25/20 10:40 AM  Result Value Ref Range   Glucose-Capillary 431 (H) 70 - 99 mg/dL  Glucose, capillary     Status: Abnormal   Collection Time: 09/25/20 11:31 AM  Result Value Ref Range   Glucose-Capillary 430 (H) 70 - 99 mg/dL  Glucose, capillary     Status: Abnormal   Collection Time: 09/25/20 12:48 PM  Result Value Ref Range    Glucose-Capillary 416 (H) 70 - 99 mg/dL  Glucose, capillary     Status: Abnormal   Collection Time: 09/25/20  1:38 PM  Result Value Ref Range   Glucose-Capillary 378 (H) 70 - 99 mg/dL   CT Angio Head W or Wo Contrast  Result  Date: 09/24/2020 CLINICAL DATA:  Initial evaluation for acute intracranial hemorrhage. EXAM: CT ANGIOGRAPHY HEAD AND NECK TECHNIQUE: Multidetector CT imaging of the head and neck was performed using the standard protocol during bolus administration of intravenous contrast. Multiplanar CT image reconstructions and MIPs were obtained to evaluate the vascular anatomy. Carotid stenosis measurements (when applicable) are obtained utilizing NASCET criteria, using the distal internal carotid diameter as the denominator. CONTRAST:  55m OMNIPAQUE IOHEXOL 350 MG/ML SOLN COMPARISON:  Prior head CT from earlier the same day. FINDINGS: CTA NECK FINDINGS Aortic arch: Visualized aortic arch normal in caliber. Origin of the great vessels incompletely assessed on this exam. Atheromatous change about the origin of the left subclavian artery without significant stenosis. Right carotid system: Right CCA patent from its origin to the bifurcation without stenosis. Scattered calcified plaque about the right carotid bulb without significant stenosis. Right ICA patent distally without stenosis, dissection or occlusion. Left carotid system: Origin of the left CCA not visualized. Visualized left CCA patent to the bifurcation without stenosis. Eccentric calcified plaque at the left carotid bulb without significant stenosis. Left ICA patent distally without stenosis, dissection or occlusion. Vertebral arteries: Both vertebral arteries arise from subclavian arteries. Right vertebral artery dominant. Vertebral arteries patent within the neck without stenosis, dissection or occlusion. Skeleton: No visible acute osseous abnormality. No discrete or worrisome osseous lesions. 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 Other neck: No other visible acute soft tissue abnormality within the neck. Enlarged multinodular goiter, with dominant 2.6 cm right thyroid nodule. This has been evaluated on previous imaging in 2013. (ref: J Am Coll Radiol. 2015 Feb;12(2): 143-50). No other mass or adenopathy. Upper chest: Diffuse interlobular septal thickening seen within the visualized lungs, suggesting pulmonary interstitial edema. Visualized upper chest demonstrates no other acute finding. Review of the MIP images confirms the above findings CTA HEAD FINDINGS Anterior circulation: Petrous segments patent bilaterally. Extensive atheromatous change throughout the carotid siphons with associated moderate to advanced diffuse narrowing. A1 segments patent bilaterally. Normal anterior communicating artery complex. Anterior cerebral arteries patent to their distal aspects without stenosis. No M1 stenosis or occlusion. Normal MCA bifurcations. Distal MCA branches well perfused and symmetric. Posterior circulation: Atheromatous change within the mid V4 segments bilaterally with associated moderate stenoses. Left PICA origin patent and normal. Right PICA not seen. Basilar patent to its distal aspect without stenosis. Superior cerebellar arteries patent bilaterally. Left PCA supplied via the basilar. Predominant fetal type origin of the right PCA. PCAs patent to their distal aspects without stenosis. Venous sinuses: Patent allowing for timing the contrast bolus. Anatomic variants: Predominant fetal type origin of the right PCA. No intracranial aneurysm. No vascular abnormality seen underlying the hemorrhages at the pons and cerebellum. Review of the MIP images confirms the above findings IMPRESSION: 1. Negative CTA for large vessel occlusion. No vascular abnormality seen underlying the hemorrhages at the pons and cerebellum. 2. Extensive atheromatous change throughout the carotid siphons with associated moderate to advanced diffuse  narrowing. 3. Atheromatous change about the V4 segments bilaterally with associated moderate stenoses. 4. Diffuse interlobular septal thickening within the visualized lungs, suggesting pulmonary interstitial edema. Electronically Signed   By: BJeannine BogaM.D.   On: 09/24/2020 21:57   CT Head Wo Contrast  Addendum Date: 09/24/2020   ADDENDUM REPORT: 09/24/2020 20:39 ADDENDUM: The patient was subsequently sedated and return for repeat imaging which demonstrates evidence of a parenchymal hemorrhage in the right half of the pons with suggestion of local extension into  the prepontine cistern. The density seen in the midline posteriorly may represent some dependent spread of hemorrhage although some artifact does remain. Critical Value/emergent results were called by telephone at the time of interpretation on 09/24/2020 at 8:39 pm to Dr. Varney Biles , who verbally acknowledged these results. Electronically Signed   By: Inez Catalina M.D.   On: 09/24/2020 20:39   Result Date: 09/24/2020 CLINICAL DATA:  Headaches, no known injury, initial encounter EXAM: CT HEAD WITHOUT CONTRAST TECHNIQUE: Contiguous axial images were obtained from the base of the skull through the vertex without intravenous contrast. COMPARISON:  05/02/2008 FINDINGS: Brain: Images are significantly limited by patient motion artifact. Mild atrophic changes and chronic white matter ischemic changes are seen. There are areas of increased attenuation anterior to the pons as well as posteriorly in the midline in the cerebellum. These are likely related to artifact although the possibility of underlying hemorrhage could not be totally excluded on this exam. Repeat imaging when the patient can better tolerate the exam is recommended. Vascular: No hyperdense vessel or unexpected calcification. Skull: Normal. Negative for fracture or focal lesion. Sinuses/Orbits: No acute finding. Other: None. IMPRESSION: Significantly limited exam. There are  findings suspicious for hemorrhage in the posterior fossa and given the clinical history repeat imaging with sedation is recommended to allow the patient to better tolerate the procedure. No other focal abnormality is noted. Electronically Signed: By: Inez Catalina M.D. On: 09/24/2020 19:55   CT Angio Neck W and/or Wo Contrast  Result Date: 09/24/2020 CLINICAL DATA:  Initial evaluation for acute intracranial hemorrhage. EXAM: CT ANGIOGRAPHY HEAD AND NECK TECHNIQUE: Multidetector CT imaging of the head and neck was performed using the standard protocol during bolus administration of intravenous contrast. Multiplanar CT image reconstructions and MIPs were obtained to evaluate the vascular anatomy. Carotid stenosis measurements (when applicable) are obtained utilizing NASCET criteria, using the distal internal carotid diameter as the denominator. CONTRAST:  45m OMNIPAQUE IOHEXOL 350 MG/ML SOLN COMPARISON:  Prior head CT from earlier the same day. FINDINGS: CTA NECK FINDINGS Aortic arch: Visualized aortic arch normal in caliber. Origin of the great vessels incompletely assessed on this exam. Atheromatous change about the origin of the left subclavian artery without significant stenosis. Right carotid system: Right CCA patent from its origin to the bifurcation without stenosis. Scattered calcified plaque about the right carotid bulb without significant stenosis. Right ICA patent distally without stenosis, dissection or occlusion. Left carotid system: Origin of the left CCA not visualized. Visualized left CCA patent to the bifurcation without stenosis. Eccentric calcified plaque at the left carotid bulb without significant stenosis. Left ICA patent distally without stenosis, dissection or occlusion. Vertebral arteries: Both vertebral arteries arise from subclavian arteries. Right vertebral artery dominant. Vertebral arteries patent within the neck without stenosis, dissection or occlusion. Skeleton: No visible acute  osseous abnormality. No discrete or worrisome osseous lesions. 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 Other neck: No other visible acute soft tissue abnormality within the neck. Enlarged multinodular goiter, with dominant 2.6 cm right thyroid nodule. This has been evaluated on previous imaging in 2013. (ref: J Am Coll Radiol. 2015 Feb;12(2): 143-50). No other mass or adenopathy. Upper chest: Diffuse interlobular septal thickening seen within the visualized lungs, suggesting pulmonary interstitial edema. Visualized upper chest demonstrates no other acute finding. Review of the MIP images confirms the above findings CTA HEAD FINDINGS Anterior circulation: Petrous segments patent bilaterally. Extensive atheromatous change throughout the carotid siphons with associated moderate to advanced diffuse  narrowing. A1 segments patent bilaterally. Normal anterior communicating artery complex. Anterior cerebral arteries patent to their distal aspects without stenosis. No M1 stenosis or occlusion. Normal MCA bifurcations. Distal MCA branches well perfused and symmetric. Posterior circulation: Atheromatous change within the mid V4 segments bilaterally with associated moderate stenoses. Left PICA origin patent and normal. Right PICA not seen. Basilar patent to its distal aspect without stenosis. Superior cerebellar arteries patent bilaterally. Left PCA supplied via the basilar. Predominant fetal type origin of the right PCA. PCAs patent to their distal aspects without stenosis. Venous sinuses: Patent allowing for timing the contrast bolus. Anatomic variants: Predominant fetal type origin of the right PCA. No intracranial aneurysm. No vascular abnormality seen underlying the hemorrhages at the pons and cerebellum. Review of the MIP images confirms the above findings IMPRESSION: 1. Negative CTA for large vessel occlusion. No vascular abnormality seen underlying the hemorrhages at the pons and cerebellum. 2.  Extensive atheromatous change throughout the carotid siphons with associated moderate to advanced diffuse narrowing. 3. Atheromatous change about the V4 segments bilaterally with associated moderate stenoses. 4. Diffuse interlobular septal thickening within the visualized lungs, suggesting pulmonary interstitial edema. Electronically Signed   By: Jeannine Boga M.D.   On: 09/24/2020 21:57   MR BRAIN WO CONTRAST  Result Date: 09/25/2020 CLINICAL DATA:  Previous stroke. Chronic atrial fibrillation. Right hemiparesis. New onset headache. Intracranial hemorrhage. EXAM: MRI HEAD WITHOUT CONTRAST TECHNIQUE: Multiplanar, multiecho pulse sequences of the brain and surrounding structures were obtained without intravenous contrast. COMPARISON:  CT studies yesterday. FINDINGS: Brain: Acute intraparenchymal hemorrhage within the right side of the pons is redemonstrated without appreciable change. Acute hemorrhage in the inferior cerebellar vermis is redemonstrated without appreciable change. Chronic small-vessel ischemic changes are present elsewhere throughout the pons. Few other old small vessel cerebellar infarctions. Cerebral hemispheres show chronic small-vessel disease throughout the thalami and hemispheric white matter. Few old basal ganglia infarctions. No large vessel territory infarction. Numerous scattered punctate foci of hemosiderin deposition related to some of the old small vessel infarctions. Small amount of blood dependent within the occipital horns of the lateral ventricles and in the perimesencephalic cistern. No hydrocephalus. No sign of mass lesion. Vascular: Major vessels at the base of the brain show flow. Skull and upper cervical spine: Negative Sinuses/Orbits: Clear/normal Other: None IMPRESSION: Redemonstration of acute intraparenchymal hemorrhage within the right side of the pons and within the inferior cerebellar vermis. Small amount of subarachnoid blood in the perimesencephalic cistern,  interfoliar subarachnoid spaces of the posterior fossa and dependent within the occipital horns of the lateral ventricles. No evidence of increasing or new hemorrhage. No hydrocephalus. Extensive chronic small-vessel ischemic changes elsewhere throughout the brain, many with punctate hemosiderin deposition. Overall findings most consistent with hypertensive disease. Electronically Signed   By: Nelson Chimes M.D.   On: 09/25/2020 07:33   DG Chest Port 1 View  Result Date: 09/24/2020 CLINICAL DATA:  Shortness of breath cough EXAM: PORTABLE CHEST 1 VIEW COMPARISON:  02/10/2020 FINDINGS: Cardiac shadow remains enlarged. Mild vascular congestion is noted although improved when compared with the prior exam. No sizable effusion is noted. No pneumothorax is seen. No bony abnormality is noted. IMPRESSION: Mild CHF although improved from the prior study. Electronically Signed   By: Inez Catalina M.D.   On: 09/24/2020 18:49     Assessment/Plan: Diagnosis: 86 yo with hx of prior left CVA with residual right hemiparesis, admitted with intraparenchymal hemorrhage involving the right pons and cerebellar vermis, SAH 1. Does the need for  close, 24 hr/day medical supervision in concert with the patient's rehab needs make it unreasonable for this patient to be served in a less intensive setting? Yes 2. Co-Morbidities requiring supervision/potential complications: a fib, morbid obesity, right heel cord contracture, dCHF, CKD III 3. Due to bladder management, bowel management, safety, skin/wound care, disease management, medication administration, pain management and patient education, does the patient require 24 hr/day rehab nursing? Yes 4. Does the patient require coordinated care of a physician, rehab nurse, therapy disciplines of PT, OT, SLP to address physical and functional deficits in the context of the above medical diagnosis(es)? Yes Addressing deficits in the following areas: balance, endurance, locomotion,  strength, transferring, bowel/bladder control, bathing, dressing, feeding, grooming, toileting, cognition and psychosocial support 5. Can the patient actively participate in an intensive therapy program of at least 3 hrs of therapy per day at least 5 days per week? Yes 6. The potential for patient to make measurable gains while on inpatient rehab is excellent 7. Anticipated functional outcomes upon discharge from inpatient rehab are mod assist  with PT, min assist and mod assist with OT, modified independent with SLP. 8. Estimated rehab length of stay to reach the above functional goals is: 20-24 days 9. Anticipated discharge destination: Home 10. Overall Rehab/Functional Prognosis: excellent  RECOMMENDATIONS: This patient's condition is appropriate for continued rehabilitative care in the following setting: CIR Patient has agreed to participate in recommended program. Yes Note that insurance prior authorization may be required for reimbursement for recommended care.  Comment: Despite her previous right hemiparesis and heel cord contracture which caused her to "walk on her toes," Mrs. Pardy was independent at home with her walker for mobility. She dressed and bathed herself. She used a w/c only for longer dx mobility in the community such as when she went into a store. She is motivated to improve and wants to go home with her family. Rehab Admissions Coordinator to follow up.  Thanks,  Meredith Staggers, MD, Mellody Drown  I have personally performed a face to face diagnostic evaluation of this patient. Additionally, I have examined pertinent labs and radiographic images. I have reviewed and concur with the physician assistant's documentation above.    Lavon Paganini Angiulli, PA-C 09/25/2020

## 2020-09-25 NOTE — H&P (Signed)
Neurology H&P  CC: Headache  History is obtained from: Patient  HPI: [name] is a 86 y.o. female with a history of previous stroke, chronic atrial fibrillation, with right hemiparesis, diabetes who presents with cute onset of headache that started abruptly sometime between 4 and 5 PM.  She denies any double vision, new weakness or numbness, or other symptoms today.  I noticed that she was slurring her speech slightly and I asked if she had noticed this and she denied it.  She presented to the [facility] emergency department where head CT revealed a pontine hemorrhage with extension into the prepontine cistern.  There is also some question of dependent spread on the CT.  She was given Andexxa to reverse her Eliquis.  While there, she developed shortness of breath and chest x-ray revealed some pulmonary edema and she was started on BiPAP prior to transfer.   LKW: 4 PM tpa given?: No, ICH IR Thrombectomy? No, ICH NIHSS:    ROS: A complete ROS was performed and is negative except as noted in the HPI.  Past Medical History:  Diagnosis Date  . Arthritis   . CHF (congestive heart failure) ([place])   . Chronic diastolic heart failure ([place])   . CKD (chronic kidney disease) stage 3, GFR 30-59 ml/min (HCC)   . Essential hypertension   . GERD (gastroesophageal reflux disease)   . Hemorrhoids   . History of stroke   . Type 2 diabetes mellitus (HCC)      Family History  Problem Relation Age of Onset  . Crohn's disease Daughter   . Hypertension Mother   . Hypertension Sister   . Diabetes Brother   . Diabetes Sister   . Diabetes Sister   . Heart attack Sister   . Colon cancer Neg Hx      Social History:  reports that she has never smoked. She has never used smokeless tobacco. She reports that she does not drink alcohol and does not use drugs.   Prior to Admission medications   Medication Sig Start Date End Date Taking? Authorizing Provider  acetaminophen (TYLENOL) 325 MG tablet  Take 2 tablets (650 mg total) by mouth every 6 (six) hours as needed for mild pain, fever or headache (or Fever >/= 101). 07/31/19   Roxan Hockey, MD  albuterol (VENTOLIN HFA) 108 (90 Base) MCG/ACT inhaler Inhale 2 puffs into the lungs every 6 (six) hours as needed for wheezing or shortness of breath. 02/12/20   Roxan Hockey, MD  apixaban (ELIQUIS) 2.5 MG TABS tablet Take 1 tablet (2.5 mg total) by mouth 2 (two) times daily. 07/31/19   Roxan Hockey, MD  ascorbic acid (VITAMIN C) 500 MG tablet Take 1 tablet (500 mg total) by mouth daily. 02/13/20   Roxan Hockey, MD  dexamethasone (DECADRON) 6 MG tablet Take 1 tablet (6 mg total) by mouth daily with breakfast. 02/12/20   Roxan Hockey, MD  famotidine (PEPCID) 20 MG tablet Take 1 tablet (20 mg total) by mouth daily. 01/04/20   Barton Dubois, MD  gabapentin (NEURONTIN) 300 MG capsule Take 300 mg by mouth 2 (two) times daily.  02/22/14   [provider]  guaiFENesin-dextromethorphan (ROBITUSSIN DM) 100-10 MG/5ML syrup Take 10 mLs by mouth every 4 (four) hours as needed for cough. 02/12/20   Roxan Hockey, MD  hydrALAZINE (APRESOLINE) 50 MG tablet Take 1.5 tablets (75 mg total) by mouth 2 (two) times daily. 06/23/19 10/08/20  Herminio Commons, MD  hydrocortisone (ANUSOL-HC) 2.5 %  rectal cream Place 1 application rectally 2 (two) times daily as needed for hemorrhoids or anal itching. 01/03/20   Barton Dubois, MD  insulin aspart (NOVOLOG) 100 UNIT/ML FlexPen Inject 0-10 Units into the skin 3 (three) times daily with meals. insulin aspart (novoLOG) injection 0-10 Units 0-10 Units Subcutaneous, 3 times daily with meals CBG < 70: Implement Hypoglycemia Standing Orders and refer to Hypoglycemia Standing Orders sidebar report  CBG 70 - 120: 0 unit CBG 121 - 150: 0 unit  CBG 151 - 200: 1 unit CBG 201 - 250: 2 units CBG 251 - 300: 4 units CBG 301 - 350: 6 units  CBG 351 - 400: 8 units  CBG > 400: 10 units 07/31/19   Emokpae, Courage, MD  insulin  detemir (LEVEMIR) 100 UNIT/ML injection Inject 0.8 mLs (80 Units total) into the skin at bedtime. 02/12/20   Roxan Hockey, MD  isosorbide mononitrate (IMDUR) 60 MG 24 hr tablet Take 1 tablet (60 mg total) by mouth daily. 02/12/20 02/11/21  Roxan Hockey, MD  ondansetron (ZOFRAN) 4 MG tablet Take 4 mg by mouth every 6 (six) hours as needed for nausea or vomiting.    [provider]  potassium chloride SA (KLOR-CON) 20 MEQ tablet Take 1 tablet (20 mEq total) by mouth daily. 04/26/20   Verta Ellen., NP  simvastatin (ZOCOR) 20 MG tablet Take 20 mg by mouth at bedtime.    [provider]  torsemide (DEMADEX) 20 MG tablet Take 1 tablet (20 mg total) by mouth daily. 02/12/20   Roxan Hockey, MD  zinc sulfate 220 (50 Zn) MG capsule Take 1 capsule (220 mg total) by mouth daily. 02/13/20   Roxan Hockey, MD     Exam: Current vital signs: BP 124/84   Pulse 79   Temp 99.3 F (37.4 C) (Axillary)   Resp (!) 24   Ht '5\' 8"'$  (1.727 m)   Wt 90.7 kg   SpO2 97%   BMI 30.41 kg/m    Physical Exam  Constitutional: Appears well-developed and well-nourished.  Psych: Affect appropriate to situation Eyes: No scleral injection HENT: No OP obstrucion Head: Normocephalic.  Cardiovascular: Normal rate and regular rhythm.  Respiratory: Effort normal and breath sounds normal. GI: Soft.  No distension. There is no tenderness.  Skin: Venous stasis changes in bilateral legs  Neuro: Mental Status: Patient is awake, alert, oriented to person, place, month, year, and situation. Patient is able to give a clear and coherent history. No signs of aphasia or neglect She does have a significant dysarthria Cranial Nerves: II: Visual Fields are full. Pupils are equal, round, and reactive to light.   III,IV, VI: EOMI without ptosis or diplopia.  V: Facial sensation is symmetric to temperature VII: Facial movement is symmetric.  VIII: hearing is intact to voice X: Uvula is midline and  palate elevates symmetrically XI: Shoulder shrug is symmetric. XII: tongue is midline without atrophy or fasciculations.  Motor: Tone is normal. Bulk is normal. 5/5 strength was present in bilateral upper extremities, she does have significant 2/5 weakness of the right lower extremity and 4/5 weakness of the left lower extremity Sensory: Sensation is symmetric to light touch and temperature in the arms and legs. Cerebellar: No clear ataxia on finger-nose-finger  I have reviewed labs in epic and the pertinent results are: Glucose 345 Creatinine 1.58 Hemoglobin 12.7 BNP 101  I have reviewed the images obtained: CT head-pontine hemorrhage with extra-axial extension  Primary Diagnosis:  Nontraumatic intracerebral hemorrhage in brain  stem  Secondary Diagnosis: Essential (primary) hypertension, Hypertension Emergency (SBP > 180 or DBP > 120 & end organ damage), Acute on chronic systolic (congestive) heart failure, Type 2 diabetes mellitus with hyperglycemia , Obesity, CKD Stage 3 (GFR 30-59) and Hypocalcemia, chronic atrial fibrillation   Impression: 86 year old female with a history of hypertension, previous stroke who presents with small pontine hematoma.  I suspect that this is likely due to hypertension.  I am not sure if the hyper density seen in the cerebellum actually represents blood or not, an MRI could be helpful in differentiating this.  Plan: 1) Admit to ICU 2) no antiplatelets or anticoagulants 3) blood pressure control with goal systolic [id number] - [id number] 4) Frequent neuro checks 5) If symptoms worsen or there is decreased mental status, repeat stat head CT 6) PT,OT,ST 7) SSI for DM 8) pulmonary consult for respiratory failure/pulmonary edema.   This patient is critically ill and at significant risk of neurological worsening, death and care requires constant monitoring of vital signs, hemodynamics,respiratory and cardiac monitoring, neurological assessment, discussion with  family, other specialists and medical decision making of high complexity. I spent 40 minutes of neurocritical care time  in the care of  this patient. This was time spent independent of any time provided by nurse practitioner or PA.  Roland Rack, MD [facility] [phone]  If 7pm- 7am, please page neurology on call as listed in [place].

## 2020-09-25 NOTE — Progress Notes (Signed)
Inpatient Rehab Admissions Coordinator Note:   Per therapy recommendations, pt was screened for CIR candidacy by Shann Medal, PT, DPT.  At this time we are recommending a CIR consult and I will request an order.  Please contact me with questions.   Shann Medal, PT, DPT [phone] 09/25/20 2:12 PM

## 2020-09-25 NOTE — Progress Notes (Signed)
STROKE TEAM PROGRESS NOTE   INTERVAL HISTORY Atrial fibrillation with HR to 130s. Hypertensive with systolic to [id number]. On Cleviprex infusion and insulin drip. Bipap->nasal cannula.  Denies headache today. Alert and conversant.  State she missed 3 doses of BP medications but is usually complaint.  Neuro exam is stable. No acute deficits on exam.  We discussed her hemorrhage diagnosis, work up, and plan of care. Her questions were addressed.  Vitals:   09/25/20 0530 09/25/20 0600 09/25/20 0700 09/25/20 0800  BP: 119/61 (!) 138/46 (!) 144/53   Pulse:  77 79   Resp: (!) 23 16 (!) 21   Temp:    98.4 F (36.9 C)  TempSrc:    Oral  SpO2:  98% 98%   Weight:      Height:       CBC:  Recent Labs  Lab 09/24/20 2020 09/24/20 2137  WBC  --  10.4  NEUTROABS  --  7.9*  HGB 7.1* 12.7  HCT 21.0* 40.8  MCV  --  92.7  PLT  --  123456   Basic Metabolic Panel:  Recent Labs  Lab 09/24/20 2137 09/25/20 0440  NA 138 137  K 3.9 4.1  CL 103 101  CO2 25 25  GLUCOSE 345* 424*  BUN 28* 27*  CREATININE 1.58* 1.93*  CALCIUM 8.8* 8.5*   Alcohol Level No results for input(s): ETH in the last 168 hours.  IMAGING and DIAGNOSTICS MRI  Redemonstration of acute intraparenchymal hemorrhage within the right side of the pons and within the inferior cerebellar vermis. Small amount of subarachnoid blood in the perimesencephalic cistern, interfoliar subarachnoid spaces of the posterior fossa and dependentn within the occipital horns of the lateral ventricles. No evidence of increasing or new hemorrhage. No hydrocephalus. Extensive chronic small-vessel ischemic changes elsewhere throughout the brain, many with punctate hemosiderin deposition. Overall findings most consistent with hypertensive disease.  CT head Demonstrates evidence of a parenchymal hemorrhage in the right half of the pons with suggestion of local extension into the prepontine cistern. The density seen in the midline posteriorly  may represent some dependent spread of hemorrhage although some artifact does remain.  CTA Head and Neck 1. Negative CTA for large vessel occlusion. No vascular abnormality seen underlying the hemorrhages at the pons and cerebellum. 2. Extensive atheromatous change throughout the carotid siphons with associated moderate to advanced diffuse narrowing. 3. Atheromatous change about the V4 segments bilaterally with associated moderate stenoses. 4. Diffuse interlobular septal thickening within the visualized lungs, suggesting pulmonary interstitial edema.  PHYSICAL EXAM Constitutional: Mildly obese elderly African-American lady not in distress.  Psych: Affect appropriate to situation Eyes: No scleral injection HENT: No OP obstrucion Head: Normocephalic.  Cardiovascular: Normal rate and regular rhythm.  Respiratory: Effort normal and breath sounds normal. GI: Soft.  No distension. There is no tenderness.  Skin: Venous stasis changes in bilateral legs  Neuro: Mental Status: Patient is awake, alert, oriented to person, place, month, year, and situation. Patient is able to give a clear and coherent history. No signs of aphasia or neglect She does have a significant dysarthria Cranial Nerves: II: Visual Fields are full. Pupils are equal, round, and reactive to light.   III,IV, VI: EOMI without ptosis or diplopia.  V: Facial sensation is symmetric to temperature VII: Facial movement is symmetric.  VIII: hearing is intact to voice X: Uvula is midline and palate elevates symmetrically XI: Shoulder shrug is symmetric. XII: tongue is midline without atrophy or fasciculations.  Motor: Tone is  normal. Bulk is normal. 5/5 strength was present in bilateral upper extremities, she does have significant 2/5 weakness of the right lower extremity and 4/5 weakness of the left lower extremity Sensory: Sensation is symmetric to light touch and temperature in the arms and legs. Cerebellar: No clear  ataxia on finger-nose-finger  ASSESSMENT/PLAN [name] is a 86 y.o. female with a history of previous stroke, chronic atrial fibrillation on renal dose Eliquis with right hemiparesis, diabetes who presents with cute onset of headache that started abruptly. Slurred speech noted on arrival. She presented to the [facility] emergency department where head CT revealed a pontine hemorrhage with extension into the prepontine cistern.  She was given Andexxa to reverse her Eliquis. While in the ED she developed shortness of breath and chest x-ray revealed some pulmonary edema and she was started on BiPAP prior to transfer. She was evaluated in the ED and admitted to the neurologic ICU for further evalation and care.   Acute right  intraparenchymal hemorrhage and small amount of subarachnoid blood in the perimesencephalic cistern, interfoliar subarachnoid spaces of the posterior fossa and dependent within the occipital horns of the lateral ventricles while on Eliquis and hypertensive to 150/120 with blood glucose 360.    HCT ode Stroke: parenchymal hemorrhage in the right half of the pons with suggestion of local extension into the prepontine cistern.   CTA head & neck: No LVO or vascular abnormality  MRI  Right pons/cerebellar  IPH with small SAH   2D Echo Pending  VTE prophylaxis - SCDs    Diet   Diet NPO time specified     On Eliquis 2.'5mg'$  BID prior to admission  No anticoagulants or antiplatelets in setting of hemorrhage  Therapy recommendations:  TBD  Disposition:  TBD  Hypertension,   Home meds: Hydralazine '75mg'$  BID  Unstable upon arrival with emergency: [id number] systolic  Requiring cleviprex infusion, will attempt to wean with restart of home meds.   Hypoxia in the setting of decompensated CHF Was on bipap, now weaned to nasal cannula Holding home diuretic Management per CCM, appreciated  Hyperlipidemia  Home meds:  Zocor '20mg'$    LDL 75, goal < 70  Hold statin in  setting of hemorrhage  Consider statin at discharge  AKI  Serum Cre   Diabetes type II Uncontrolled  Insulin Drip per CCM with blood glucose 440 this am.   Home meds: Novolog SSI with meals, Levemir 80units at hs,   HgbA1c 8.9, goal < 7.0  CBGs Recent Labs    09/25/20 0713  GLUCAP 446*      SSI   Other Stroke Risk Factors  Advanced Age >/= 46   Associated with increased stroke risk, recommend weight loss, diet and exercise as appropriate   High risk for Obstructive sleep apnea  Diastolic congestive heart failure  Other Active Problems     Hospital day # 1  I have personally obtained history,examined this patient, reviewed notes, independently viewed imaging studies, participated in medical decision making and plan of care.ROS completed by me personally and pertinent positives fully documented  I have made any additions or clarifications directly to the above note. Agree with note above.  She presented to outside hospital with pontine intracerebral hemorrhage on Eliquis which was reversed with Andexxa.  She does have A. fib.  Continue close neurological observation and strict blood pressure control with systolic goal below [id number] for the first 24 hours and then below 160.  Wean Cleviprex drip as tolerated and  use as needed IV hydralazine and labetalol and start oral blood pressure medications.  Mobilize out of bed.  Therapy consults.  Long discussion with patient and with her daughter and granddaughter at the bedside and answered questions about her care.  Patient presents treatment dilemma as to whether resume Eliquis after 2 to 4 weeks or antiplatelet therapy.  May consider possible enrollment in the ASPIRE trial ( aspirin versus Eliquis ) if patient and family are willing This patient is critically ill and at significant risk of neurological worsening, death and care requires constant monitoring of vital signs, hemodynamics,respiratory and cardiac monitoring, extensive  review of multiple databases, frequent neurological assessment, discussion with family, other specialists and medical decision making of high complexity.I have made any additions or clarifications directly to the above note.This critical care time does not reflect procedure time, or teaching time or supervisory time of PA/NP/Med Resident etc but could involve care discussion time.  I spent 30 minutes of neurocritical care time  in the care of  this patient.      Antony Contras, MD Medical Director [facility] Pager: [phone] 09/25/2020 3:48 PM   To contact Stroke Continuity provider, please refer to [email or web address] After hours, contact General Neurology

## 2020-09-25 NOTE — Progress Notes (Signed)
Patient currently tolerating 2L [place] well. No respiratory distress noted. BIPAP not needed at this time. BIPAP is on standby at bedside. RT will monitor as needed.

## 2020-09-25 NOTE — Progress Notes (Signed)
  Echocardiogram 2D Echocardiogram has been performed.  [name] 09/25/2020, 4:30 PM

## 2020-09-25 NOTE — Evaluation (Addendum)
Physical Therapy Evaluation Patient Details Name: [name] MRN: [id number] DOB: 18-Jun-1934 Today's Date: 09/25/2020   History of Present Illness  Pt is a 86 y.o. F admitted with acute onset headache. 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 While in ED, she had some SOB and pulmonary edema on chest x-ray and was started on BiPAP. Significant PMH: CHF, CKD, HTN, DM2, prior stroke with right hemiplegia.  Clinical Impression  PTA, pt lives with her son, is independent with ADL's, requires assist with IADL's, and uses a walker for household ambulation. Pt presents with generalized weakness (RLE weaker than LLE), decreased endurance, and balance deficits. Requiring two person maximal assist for bed mobility and transfers to partial standing. Due to excessive trunk flexion and inability to obtain upright, could not transfer to chair. Suspect good progress considering PLOF, motivation and family support. Recommend post acute rehab to address deficits and maximize functional mobility.     Follow Up Recommendations CIR    Equipment Recommendations  Wheelchair (measurements PT);Wheelchair cushion (measurements PT)    Recommendations for Other Services       Precautions / Restrictions Precautions Precautions: Fall Restrictions Weight Bearing Restrictions: No      Mobility  Bed Mobility Overal bed mobility: Needs Assistance Bed Mobility: Supine to Sit;Sit to Supine     Supine to sit: Max assist;+2 for physical assistance Sit to supine: Max assist;+2 for physical assistance   General bed mobility comments: Pt able to move BLE's minimally, ultimately requiring assist to progress off edge of bed and trunk to upright. Use of bed pad to scoot hips forward. Assist for trunk guidance and BLE elevation back into bed    Transfers Overall transfer level: Needs assistance Equipment used: Rolling walker (2 wheeled) Transfers: Sit to/from Stand Sit to Stand:  Max assist;+2 physical assistance         General transfer comment: maxA + 2 to stand from edge of bed x 3. On first trial, utilized walker, but pt with difficulty transitioning hands to handles. Attempted face to face transfer for second and third trial. Pt able to achieve hip clearance, but signficant trunk flexion and could not achieve upright posture  Ambulation/Gait                Stairs            Wheelchair Mobility    Modified Rankin (Stroke Patients Only) Modified Rankin (Stroke Patients Only) Pre-Morbid Rankin Score: Moderate disability Modified Rankin: Severe disability     Balance Overall balance assessment: Needs assistance Sitting-balance support: Feet supported Sitting balance-Leahy Scale: Poor Sitting balance - Comments: One instance of left lateral LOB, requiring supervision overall   Standing balance support: Bilateral upper extremity supported Standing balance-Leahy Scale: Poor                               Pertinent Vitals/Pain Pain Assessment: No/denies pain    Home Living Family/patient expects to be discharged to:: Private residence Living Arrangements: Children Available Help at Discharge: Family;Available 24 hours/day Type of Home: House Home Access: Ramped entrance     Home Layout: One level Home Equipment: Walker - 2 wheels;Bedside commode;Wheelchair - Brewing technologist;Toilet riser;Tub bench;Hospital bed (states she needs a new w/c) Additional Comments: sleeps in recliner, gets up from [facility] elevated position    Prior Function Level of Independence: Needs assistance   Gait / Transfers Assistance Needed: RW; very old w/c  ADL's / Homemaking Assistance Needed: ADL by pt; iADL from family; cooking,        Hand Dominance   Dominant Hand: Right    Extremity/Trunk Assessment   Upper Extremity Assessment Upper Extremity Assessment: Defer to OT evaluation    Lower Extremity Assessment Lower Extremity  Assessment: RLE deficits/detail;LLE deficits/detail RLE Deficits / Details: Grossly 2/5 RLE Sensation: WNL LLE Deficits / Details: Grossly 3/5    Cervical / Trunk Assessment Cervical / Trunk Assessment: Kyphotic  Communication   Communication: No difficulties  Cognition Arousal/Alertness: Awake/alert Behavior During Therapy: WFL for tasks assessed/performed Overall Cognitive Status: Impaired/Different from baseline Area of Impairment: Safety/judgement;Awareness                         Safety/Judgement: Decreased awareness of deficits Awareness: Emergent   General Comments: Pt thinks it's due to lines attached to her that she is not able to stand      General Comments      Exercises     Assessment/Plan    PT Assessment Patient needs continued PT services  PT Problem List Decreased strength;Decreased activity tolerance;Decreased balance;Decreased mobility;Decreased safety awareness;Obesity       PT Treatment Interventions DME instruction;Gait training;Functional mobility training;Therapeutic activities;Therapeutic exercise;Balance training;Patient/family education;Wheelchair mobility training    PT Goals (Current goals can be found in the Care Plan section)  Acute Rehab PT Goals Patient Stated Goal: get up to the chair PT Goal Formulation: With patient/family Time For Goal Achievement: 10/09/20 Potential to Achieve Goals: Good    Frequency Min 4X/week   Barriers to discharge        Co-evaluation PT/OT/SLP Co-Evaluation/Treatment: Yes Reason for Co-Treatment: For patient/therapist safety;To address functional/ADL transfers PT goals addressed during session: Mobility/safety with mobility         AM-PAC PT "6 Clicks" Mobility  Outcome Measure Help needed turning from your back to your side while in a flat bed without using bedrails?: A Lot Help needed moving from lying on your back to sitting on the side of a flat bed without using bedrails?:  Total Help needed moving to and from a bed to a chair (including a wheelchair)?: Total Help needed standing up from a chair using your arms (e.g., wheelchair or bedside chair)?: Total Help needed to walk in hospital room?: Total Help needed climbing 3-5 steps with a railing? : Total 6 Click Score: 7    End of Session Equipment Utilized During Treatment: Gait belt;Oxygen Activity Tolerance: Patient limited by fatigue Patient left: in bed;with call bell/phone within reach;with bed alarm set;with family/visitor present Nurse Communication: Mobility status PT Visit Diagnosis: Other abnormalities of gait and mobility (R26.89);Muscle weakness (generalized) (M62.81);Difficulty in walking, not elsewhere classified (R26.2)    Time: [id number] PT Time Calculation (min) (ACUTE ONLY): 37 min   Charges:   PT Evaluation $PT Eval Moderate Complexity: [place], PT, DPT Acute Rehabilitation Services Pager [phone] Office [phone]   Deno Etienne 09/25/2020, 1:39 PM

## 2020-09-25 NOTE — Progress Notes (Signed)
eLink Physician-Brief Progress Note Patient Name: [name] DOB: 07/08/1934 MRN: [id number]   Date of Service  09/25/2020  HPI/Events of Note  Patient admitted with a posterior fossa ICH, she was also very hypertensive on presentation.  eICU Interventions  New Patient Evaluation.        Kerry Kass Shion Bluestein 09/25/2020, 12:25 AM

## 2020-09-25 NOTE — Consult Note (Signed)
NAME:  [name], MRN:  [id number], DOB:  07-31-1934, LOS: 1 ADMISSION DATE:  09/24/2020, CONSULTATION DATE: 09/25/2020 REFERRING MD: Dr. Leonel Ramsay CHIEF COMPLAINT: Pulmonary edema  History of Present Illness:  This is an 86 year old female who presented to the emergency room from home.  She presented with acute onset severe headache that started shortly prior to arrival.  On arrival to the emergency room she was found to have a pontine hemorrhage with extension into the prepontine cistern.  While in the emergency room she was noted to have some shortness of breath and pulmonary edema on chest x-ray therefore she was started on BiPAP.  Pertinent  Medical History  CHF CKD Hypertension GERD Diabetes mellitus type 2 Significant Hospital Events: Including procedures, antibiotic start and stop dates in addition to other pertinent events   .     Objective   Blood pressure 124/84, pulse 67, temperature 98.2 F (36.8 C), temperature source Axillary, resp. rate 14, height '5\' 8"'$  (1.727 m), weight 90.7 kg, SpO2 100 %.       No intake or output data in the 24 hours ending 09/25/20 0007 Filed Weights   09/24/20 1729  Weight: 90.7 kg    Examination: General: No acute distress HENT: Atraumatic/normocephalic mucous membranes are moist Lungs: Very minimal scattered rales at the bases.  No rhonchi Cardiovascular: Regular rate Abdomen: Soft, nontender, nondistended, no rebound/rigidity/guarding extremities Extremities: Distal pulse intact x4.  +3 edema on the lower extremities.  Chronic venous stasis changes. Neuro: Awake and alert.  Follows simple commands x4 extremities weaker on the right side than on the left side.   Labs/imaging that I havepersonally reviewed  (right click and "Reselect all SmartList Selections" daily)      Assessment & Plan:  Acute hemorrhagic pontine hemorrhage Mild pulmonary edema Decompensated congestive heart failure-mild Lower extremity  edema Hypertension Diabetes mellitus type 2  Plan: Patient is doing well on BiPAP.  Discontinue the BiPAP at this time and put the patient on nasal cannula. Lasix 20 mg IV weekly x2 doses and then follow I/os for redosing. Serial neurochecks Patient is on Cleviprex for blood pressure control. Echocardiogram in a.m.    Best practice (right click and "Reselect all SmartList Selections" daily)  Diet:  NPO Pain/Anxiety/Delirium protocol (if indicated): No VAP protocol (if indicated): Not indicated DVT prophylaxis: SCD GI prophylaxis: N/A Glucose control:  SSI Yes Central venous access:  N/A Arterial line:  N/A Foley:  N/A Mobility:  bed rest  PT consulted: N/A Last date of multidisciplinary goals of care discussion [Code Status:  full code Disposition: Monitor in the intensive care unit  Labs   CBC: Recent Labs  Lab 09/24/20 2020 09/24/20 2137  WBC  --  10.4  NEUTROABS  --  7.9*  HGB 7.1* 12.7  HCT 21.0* 40.8  MCV  --  92.7  PLT  --  123456    Basic Metabolic Panel: Recent Labs  Lab 09/24/20 2020 09/24/20 2137  NA 145 138  K 2.3* 3.9  CL 122* 103  CO2  --  25  GLUCOSE 217* 345*  BUN 18 28*  CREATININE 0.70 1.58*  CALCIUM  --  8.8*   GFR: Estimated Creatinine Clearance: 31.2 mL/min (A) (by C-G formula based on SCr of 1.58 mg/dL (H)). Recent Labs  Lab 09/24/20 2137  WBC 10.4    Liver Function Tests: No results for input(s): AST, ALT, ALKPHOS, BILITOT, PROT, ALBUMIN in the last 168 hours. No results for input(s): LIPASE, AMYLASE in  the last 168 hours. No results for input(s): AMMONIA in the last 168 hours.  ABG    Component Value Date/Time   PHART 7.310 (L) 07/25/2019 0620   PCO2ART 51.6 (H) 07/25/2019 0620   PO2ART 87.1 07/25/2019 0620   HCO3 23.7 07/25/2019 0620   TCO2 15 (L) 09/24/2020 2020   ACIDBASEDEF 0.3 07/25/2019 0620   O2SAT 95.8 07/25/2019 0620     Coagulation Profile: Recent Labs  Lab 09/24/20 2137  INR 1.1    Cardiac  Enzymes: No results for input(s): CKTOTAL, CKMB, CKMBINDEX, TROPONINI in the last 168 hours.  HbA1C: Hgb A1c MFr Bld  Date/Time Value Ref Range Status  02/08/2020 10:03 AM 8.9 (H) 4.8 - 5.6 % Final    Comment:    (NOTE) Pre diabetes:          5.7%-6.4%  Diabetes:              >6.4%  Glycemic control for   <7.0% adults with diabetes   07/23/2019 03:26 PM 8.0 (H) 4.8 - 5.6 % Final    Comment:    (NOTE) Pre diabetes:          5.7%-6.4% Diabetes:              >6.4% Glycemic control for   <7.0% adults with diabetes     CBG: No results for input(s): GLUCAP in the last 168 hours.  Review of Systems:     Past Medical History:  She,  has a past medical history of Arthritis, CHF (congestive heart failure) (HCC), Chronic diastolic heart failure ([place]), CKD (chronic kidney disease) stage 3, GFR 30-59 ml/min ([place]), Essential hypertension, GERD (gastroesophageal reflux disease), Hemorrhoids, History of stroke, and Type 2 diabetes mellitus ([place]).   Surgical History:   Past Surgical History:  Procedure Laterality Date  . BACK SURGERY    . CATARACT EXTRACTION W/PHACO Left 02/28/2013   Procedure: CATARACT EXTRACTION PHACO AND INTRAOCULAR LENS PLACEMENT (IOL) CDE=15.60;  Surgeon: Elta Guadeloupe T. Gershon Crane, MD;  Location: [facility];  Service: Ophthalmology;  Laterality: Left;  . CHOLECYSTECTOMY    . COLONOSCOPY  12/12/2003   RMR: Normal rectum.  Long capacious tortuous colon, colonic mucosa appeared normal  . COLONOSCOPY N/A 03/21/2014   Dr. [name]: internal and external hemorrhoids, torturous colon, colonic diverticulosis   . ESOPHAGOGASTRODUODENOSCOPY  12/28/2001   [id number] sliding hiatal hernia/. Three small bulbar ulcers, two with stigmata of bleeding and these were coagulated using heater probe unit   . ESOPHAGOGASTRODUODENOSCOPY N/A 03/21/2014   Dr. [name]: cervical esophageal web s/p dilation, negative H.pylori   . EYE SURGERY    . HIP FRACTURE SURGERY  2008  . JOINT REPLACEMENT     Rt hip,  ????? sounds like just for fracture  . MALONEY DILATION N/A 03/21/2014   Procedure: [name] DILATION;  Surgeon: [name], MD;  Location: [facility];  Service: Endoscopy;  Laterality: N/A;  . SAVORY DILATION N/A 03/21/2014   Procedure: SAVORY DILATION;  Surgeon: [name], MD;  Location: [facility] SU[facility];  Service: Endoscopy;  Laterality: N/A;     Social History:   reports that she has never smoked. She has never used smokeless tobacco. She reports that she does not drink alcohol and does not use drugs.   Family History:  Her family history includes Crohn's disease in her daughter; Diabetes in her brother, sister, and sister; Heart attack in her sister; Hypertension in her mother and sister. There is no history of Colon cancer.  Allergies No Known Allergies   Home Medications  Prior to Admission medications   Medication Sig Start Date End Date Taking? Authorizing Provider  acetaminophen (TYLENOL) 325 MG tablet Take 2 tablets (650 mg total) by mouth every 6 (six) hours as needed for mild pain, fever or headache (or Fever >/= 101). 07/31/19   Roxan Hockey, MD  albuterol (VENTOLIN HFA) 108 (90 Base) MCG/ACT inhaler Inhale 2 puffs into the lungs every 6 (six) hours as needed for wheezing or shortness of breath. 02/12/20   Roxan Hockey, MD  apixaban (ELIQUIS) 2.5 MG TABS tablet Take 1 tablet (2.5 mg total) by mouth 2 (two) times daily. 07/31/19   Roxan Hockey, MD  ascorbic acid (VITAMIN C) 500 MG tablet Take 1 tablet (500 mg total) by mouth daily. 02/13/20   Roxan Hockey, MD  dexamethasone (DECADRON) 6 MG tablet Take 1 tablet (6 mg total) by mouth daily with breakfast. 02/12/20   Roxan Hockey, MD  famotidine (PEPCID) 20 MG tablet Take 1 tablet (20 mg total) by mouth daily. 01/04/20   Barton Dubois, MD  gabapentin (NEURONTIN) 300 MG capsule Take 300 mg by mouth 2 (two) times daily.  02/22/14   [provider]  guaiFENesin-dextromethorphan (ROBITUSSIN DM) 100-10  MG/5ML syrup Take 10 mLs by mouth every 4 (four) hours as needed for cough. 02/12/20   Roxan Hockey, MD  hydrALAZINE (APRESOLINE) 50 MG tablet Take 1.5 tablets (75 mg total) by mouth 2 (two) times daily. 06/23/19 10/08/20  Herminio Commons, MD  hydrocortisone (ANUSOL-HC) 2.5 % rectal cream Place 1 application rectally 2 (two) times daily as needed for hemorrhoids or anal itching. 01/03/20   Barton Dubois, MD  insulin aspart (NOVOLOG) 100 UNIT/ML FlexPen Inject 0-10 Units into the skin 3 (three) times daily with meals. insulin aspart (novoLOG) injection 0-10 Units 0-10 Units Subcutaneous, 3 times daily with meals CBG < 70: Implement Hypoglycemia Standing Orders and refer to Hypoglycemia Standing Orders sidebar report  CBG 70 - 120: 0 unit CBG 121 - 150: 0 unit  CBG 151 - 200: 1 unit CBG 201 - 250: 2 units CBG 251 - 300: 4 units CBG 301 - 350: 6 units  CBG 351 - 400: 8 units  CBG > 400: 10 units 07/31/19   Emokpae, Courage, MD  insulin detemir (LEVEMIR) 100 UNIT/ML injection Inject 0.8 mLs (80 Units total) into the skin at bedtime. 02/12/20   Roxan Hockey, MD  isosorbide mononitrate (IMDUR) 60 MG 24 hr tablet Take 1 tablet (60 mg total) by mouth daily. 02/12/20 02/11/21  Roxan Hockey, MD  ondansetron (ZOFRAN) 4 MG tablet Take 4 mg by mouth every 6 (six) hours as needed for nausea or vomiting.    [provider]  potassium chloride SA (KLOR-CON) 20 MEQ tablet Take 1 tablet (20 mEq total) by mouth daily. 04/26/20   Verta Ellen., NP  simvastatin (ZOCOR) 20 MG tablet Take 20 mg by mouth at bedtime.    [provider]  torsemide (DEMADEX) 20 MG tablet Take 1 tablet (20 mg total) by mouth daily. 02/12/20   Roxan Hockey, MD  zinc sulfate 220 (50 Zn) MG capsule Take 1 capsule (220 mg total) by mouth daily. 02/13/20   Roxan Hockey, MD     Critical care time: 45 minutes

## 2020-09-25 NOTE — Evaluation (Signed)
Occupational Therapy Evaluation Patient Details Name: [name] MRN: [id number] DOB: 1934/12/26 Today's Date: 09/25/2020    History of Present Illness Pt is a 86 y.o. F admitted with acute onset headache. 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 While in ED, she had some SOB and pulmonary edema on chest x-ray and was started on BiPAP. Significant PMH: CHF, CKD, HTN, DM2, prior stroke with right hemiplegia.   Clinical Impression   Pt PTA: Pt lives with children and reports independence with ADL and iADL provided for her. Pt using AD for mobility. Pt currently, limited by decreased strength R > L, decreased ability to care for self and decreased mobility. Pt with poor awareness of deficits. Pt set-upA to totalA for ADL and maxA +2 for bed mobility and to partial standing. Pt with cognitive deficits in problem solving and safety awareness. Pt would greatly benefit from continued OT skilled services. OT following acutely. Pt very motivated to return to PLOF.      Follow Up Recommendations  CIR    Equipment Recommendations  3 in 1 bedside commode    Recommendations for Other Services Rehab consult     Precautions / Restrictions Precautions Precautions: Fall Restrictions Weight Bearing Restrictions: No      Mobility Bed Mobility Overal bed mobility: Needs Assistance Bed Mobility: Supine to Sit;Sit to Supine     Supine to sit: Max assist;+2 for physical assistance Sit to supine: Max assist;+2 for physical assistance   General bed mobility comments: Pt initiating movement of BLEs to EOB and requiring assist for rolling on side for trunk elevation. Pt requiring maxA +2 for all movements overall and simple commands for follow through.    Transfers Overall transfer level: Needs assistance Equipment used: Rolling walker (2 wheeled) Transfers: Sit to/from Stand Sit to Stand: Max assist;+2 physical assistance         General transfer comment:  maxA + 2 to stand from edge of bed x 3. 1/3 trials with RW, unable to clear bottom. 2/3 front to front, 3/3 front to front and pt came to partial standing with severe flexed forward position. (family stating that is how she would move prior)    Balance Overall balance assessment: Needs assistance Sitting-balance support: Feet supported Sitting balance-Leahy Scale: Poor Sitting balance - Comments: One instance of left lateral LOB, requiring supervision overall   Standing balance support: Bilateral upper extremity supported Standing balance-Leahy Scale: Poor                             ADL either performed or assessed with clinical judgement   ADL Overall ADL's : Needs assistance/impaired Eating/Feeding: Set up;Sitting   Grooming: Set up;Sitting   Upper Body Bathing: Minimal assistance;Sitting   Lower Body Bathing: Total assistance;+2 for physical assistance;+2 for safety/equipment;Sitting/lateral leans;Sit to/from stand   Upper Body Dressing : Minimal assistance;Sitting   Lower Body Dressing: Total assistance;+2 for physical assistance;Sitting/lateral leans;Sit to/from stand;Cueing for safety   Toilet Transfer: Total assistance;+2 for physical assistance;+2 for safety/equipment Toilet Transfer Details (indicate cue type and reason): attempt at bedside, but pt unable to stand upright enough to clear bottom from bed, but about 2 inches. Toileting- Clothing Manipulation and Hygiene: Total assistance;+2 for physical assistance;+2 for safety/equipment;Sitting/lateral lean;Bed level       Functional mobility during ADLs: Maximal assistance;+2 for physical assistance;+2 for safety/equipment;Rolling walker;Cueing for safety;Cueing for sequencing (attempt at EOB) General ADL Comments: Pt limited by  decreased strength R > L, decreased ability to care for self and decreased mobility. Pt with poor awareness of deficits.     Vision Baseline Vision/History: No visual  deficits Patient Visual Report: No change from baseline Vision Assessment?: Yes;No apparent visual deficits Eye Alignment: Within Functional Limits Ocular Range of Motion: Within Functional Limits Alignment/Gaze Preference: Within Defined Limits     Perception     Praxis      Pertinent Vitals/Pain Pain Assessment: No/denies pain     Hand Dominance Right   Extremity/Trunk Assessment Upper Extremity Assessment Upper Extremity Assessment: Generalized weakness;LUE deficits/detail;RUE deficits/detail RUE Deficits / Details: 3/5 MM grade LUE Deficits / Details: 3+/5 MM grade   Lower Extremity Assessment Lower Extremity Assessment: Generalized weakness;RLE deficits/detail RLE Deficits / Details: weakness, prior CVA   Cervical / Trunk Assessment Cervical / Trunk Assessment: Kyphotic   Communication Communication Communication: No difficulties   Cognition Arousal/Alertness: Awake/alert Behavior During Therapy: WFL for tasks assessed/performed Overall Cognitive Status: Impaired/Different from baseline Area of Impairment: Safety/judgement;Awareness                         Safety/Judgement: Decreased awareness of deficits Awareness: Emergent   General Comments: Pt stating "I was just able to stand." "If it weren't for all of these lines, I could stand." A/O x4. Pt with decreased safety awareness as she kept scooting forward at EOB.   General Comments  VSS on RA. No c/o dizziness,    Exercises     Shoulder Instructions      Home Living Family/patient expects to be discharged to:: Private residence Living Arrangements: Children Available Help at Discharge: Family;Available 24 hours/day Type of Home: House Home Access: Ramped entrance     Home Layout: One level     Bathroom Shower/Tub: Occupational psychologist: Handicapped height Bathroom Accessibility: Yes   Home Equipment: Environmental consultant - 2 wheels;Bedside commode;Wheelchair - Brewing technologist;Toilet  riser;Tub bench;Hospital bed;Adaptive equipment (states she needs a new w/c) Adaptive Equipment: Reacher;Sock aid Additional Comments: sleeps in recliner, gets up from [facility] elevated position      Prior Functioning/Environment Level of Independence: Needs assistance  Gait / Transfers Assistance Needed: RW; very old w/c ADL's / Homemaking Assistance Needed: ADL by pt; iADL from family; cooking,            OT Problem List: Increased edema;Impaired UE functional use;Decreased strength;Decreased activity tolerance;Impaired vision/perception;Impaired balance (sitting and/or standing);Decreased cognition;Decreased safety awareness      OT Treatment/Interventions: Self-care/ADL training;Therapeutic exercise;Neuromuscular education;Energy conservation;DME and/or AE instruction;Therapeutic activities;Cognitive remediation/compensation;Patient/family education;Balance training    OT Goals(Current goals can be found in the care plan section) Acute Rehab OT Goals Patient Stated Goal: get up to the chair OT Goal Formulation: With patient Time For Goal Achievement: 10/09/20 Potential to Achieve Goals: Good ADL Goals Pt Will Perform Grooming: with min guard assist;standing Pt Will Perform Lower Body Dressing: with min assist;with adaptive equipment;sitting/lateral leans;sit to/from stand Pt Will Transfer to Toilet: with min assist;with +2 assist;stand pivot transfer;bedside commode Additional ADL Goal #1: Pt will increase to minA for bed mobility as precursor for ADL Additional ADL Goal #2: Pt will perform OOB ADL tasks with minA displaying good safety awareness.  OT Frequency: Min 2X/week   Barriers to D/C:            Co-evaluation PT/OT/SLP Co-Evaluation/Treatment: Yes Reason for Co-Treatment: Complexity of the patient's impairments (multi-system involvement);To address functional/ADL transfers   OT goals addressed during session: ADL's and  self-care;Strengthening/ROM      AM-PAC OT "6  Clicks" Daily Activity     Outcome Measure Help from another person eating meals?: None Help from another person taking care of personal grooming?: A Little Help from another person toileting, which includes using toliet, bedpan, or urinal?: Total Help from another person bathing (including washing, rinsing, drying)?: A Lot Help from another person to put on and taking off regular upper body clothing?: A Little Help from another person to put on and taking off regular lower body clothing?: Total 6 Click Score: 14   End of Session Equipment Utilized During Treatment: Gait belt;Rolling walker Nurse Communication: Mobility status  Activity Tolerance: Patient tolerated treatment well;Patient limited by fatigue Patient left: in bed;with call bell/phone within reach;with bed alarm set  OT Visit Diagnosis: Unsteadiness on feet (R26.81)                Time: [id number] OT Time Calculation (min): 37 min Charges:  OT General Charges $OT Visit: 1 Visit OT Evaluation $OT Eval Moderate Complexity: 1 Mod  Jefferey Pica, OTR/L Acute Rehabilitation Services Pager: [phone] Office: [phone]   Soni Kegel C 09/25/2020, 8:34 PM

## 2020-09-26 DIAGNOSIS — I619 Nontraumatic intracerebral hemorrhage, unspecified: Secondary | ICD-10-CM | POA: Diagnosis not present

## 2020-09-26 DIAGNOSIS — I610 Nontraumatic intracerebral hemorrhage in hemisphere, subcortical: Secondary | ICD-10-CM | POA: Diagnosis not present

## 2020-09-26 DIAGNOSIS — I161 Hypertensive emergency: Secondary | ICD-10-CM | POA: Diagnosis not present

## 2020-09-26 LAB — HEMOGLOBIN A1C
Hgb A1c MFr Bld: 9.9 % — ABNORMAL HIGH (ref 4.8–5.6)
Mean Plasma Glucose: 237.43 mg/dL

## 2020-09-26 LAB — CBC
HCT: 32.8 % — ABNORMAL LOW (ref 36.0–46.0)
Hemoglobin: 10.6 g/dL — ABNORMAL LOW (ref 12.0–15.0)
MCH: 28.8 pg (ref 26.0–34.0)
MCHC: 32.3 g/dL (ref 30.0–36.0)
MCV: 89.1 fL (ref 80.0–100.0)
Platelets: 213 10*3/uL (ref 150–400)
RBC: 3.68 MIL/uL — ABNORMAL LOW (ref 3.87–5.11)
RDW: 15.3 % (ref 11.5–15.5)
WBC: 8.3 10*3/uL (ref 4.0–10.5)
nRBC: 0 % (ref 0.0–0.2)

## 2020-09-26 LAB — GLUCOSE, CAPILLARY
Glucose-Capillary: 147 mg/dL — ABNORMAL HIGH (ref 70–99)
Glucose-Capillary: 158 mg/dL — ABNORMAL HIGH (ref 70–99)
Glucose-Capillary: 232 mg/dL — ABNORMAL HIGH (ref 70–99)
Glucose-Capillary: 252 mg/dL — ABNORMAL HIGH (ref 70–99)
Glucose-Capillary: 258 mg/dL — ABNORMAL HIGH (ref 70–99)
Glucose-Capillary: 300 mg/dL — ABNORMAL HIGH (ref 70–99)
Glucose-Capillary: 302 mg/dL — ABNORMAL HIGH (ref 70–99)

## 2020-09-26 LAB — RENAL FUNCTION PANEL
Albumin: 2.8 g/dL — ABNORMAL LOW (ref 3.5–5.0)
Anion gap: 8 (ref 5–15)
BUN: 29 mg/dL — ABNORMAL HIGH (ref 8–23)
CO2: 24 mmol/L (ref 22–32)
Calcium: 8.3 mg/dL — ABNORMAL LOW (ref 8.9–10.3)
Chloride: 107 mmol/L (ref 98–111)
Creatinine, Ser: 2.16 mg/dL — ABNORMAL HIGH (ref 0.44–1.00)
GFR, Estimated: 22 mL/min — ABNORMAL LOW (ref >60)
Glucose, Bld: 147 mg/dL — ABNORMAL HIGH (ref 70–99)
Phosphorus: 3.5 mg/dL (ref 2.5–4.6)
Potassium: 3.9 mmol/L (ref 3.5–5.1)
Sodium: 139 mmol/L (ref 135–145)

## 2020-09-26 LAB — LIPID PANEL
Cholesterol: 129 mg/dL (ref 0–200)
HDL: 29 mg/dL — ABNORMAL LOW (ref >40)
LDL Cholesterol: 73 mg/dL (ref 0–99)
Total CHOL/HDL Ratio: 4.4 RATIO
Triglycerides: 133 mg/dL (ref <150)
VLDL: 27 mg/dL (ref 0–40)

## 2020-09-26 LAB — TROPONIN I (HIGH SENSITIVITY)
Troponin I (High Sensitivity): 30 ng/L — ABNORMAL HIGH (ref <18)
Troponin I (High Sensitivity): 28 ng/L — ABNORMAL HIGH (ref <18)

## 2020-09-26 MED ORDER — INSULIN ASPART 100 UNIT/ML IJ SOLN
0.0000 [IU] | Freq: Every day | INTRAMUSCULAR | Status: DC
  Administered 2020-09-26: 3 [IU] via SUBCUTANEOUS
  Administered 2020-09-27 – 2020-09-28 (×2): 2 [IU] via SUBCUTANEOUS

## 2020-09-26 MED ORDER — HYDRALAZINE HCL 20 MG/ML IJ SOLN
10.0000 mg | Freq: Four times a day (QID) | INTRAMUSCULAR | Status: DC | PRN
  Administered 2020-09-26 – 2020-09-30 (×6): 10 mg via INTRAVENOUS
  Filled 2020-09-26 (×6): qty 1

## 2020-09-26 MED ORDER — LACTATED RINGERS IV BOLUS
500.0000 mL | Freq: Once | INTRAVENOUS | Status: AC
  Administered 2020-09-26: 500 mL via INTRAVENOUS

## 2020-09-26 MED ORDER — INSULIN ASPART 100 UNIT/ML IJ SOLN
1.0000 [IU] | INTRAMUSCULAR | Status: DC
  Administered 2020-09-26: 1 [IU] via SUBCUTANEOUS

## 2020-09-26 MED ORDER — INSULIN DETEMIR 100 UNIT/ML ~~LOC~~ SOLN
10.0000 [IU] | Freq: Two times a day (BID) | SUBCUTANEOUS | Status: DC
  Administered 2020-09-26: 10 [IU] via SUBCUTANEOUS
  Filled 2020-09-26 (×3): qty 0.1

## 2020-09-26 MED ORDER — INSULIN ASPART 100 UNIT/ML IJ SOLN
0.0000 [IU] | Freq: Three times a day (TID) | INTRAMUSCULAR | Status: DC
  Administered 2020-09-26: 15 [IU] via SUBCUTANEOUS
  Administered 2020-09-26: 11 [IU] via SUBCUTANEOUS
  Administered 2020-09-27: 7 [IU] via SUBCUTANEOUS
  Administered 2020-09-27: 11 [IU] via SUBCUTANEOUS
  Administered 2020-09-27 – 2020-09-28 (×2): 7 [IU] via SUBCUTANEOUS
  Administered 2020-09-28: 11 [IU] via SUBCUTANEOUS
  Administered 2020-09-28: 15 [IU] via SUBCUTANEOUS
  Administered 2020-09-29: 7 [IU] via SUBCUTANEOUS
  Administered 2020-09-29: 4 [IU] via SUBCUTANEOUS
  Administered 2020-09-29 – 2020-09-30 (×4): 7 [IU] via SUBCUTANEOUS

## 2020-09-26 MED ORDER — PANTOPRAZOLE SODIUM 40 MG PO TBEC
40.0000 mg | DELAYED_RELEASE_TABLET | Freq: Every day | ORAL | Status: DC
  Administered 2020-09-26 – 2020-09-30 (×5): 40 mg via ORAL
  Filled 2020-09-26 (×5): qty 1

## 2020-09-26 MED ORDER — INSULIN DETEMIR 100 UNIT/ML ~~LOC~~ SOLN
10.0000 [IU] | Freq: Two times a day (BID) | SUBCUTANEOUS | Status: DC
  Administered 2020-09-26 (×2): 10 [IU] via SUBCUTANEOUS
  Filled 2020-09-26 (×4): qty 0.1

## 2020-09-26 MED ORDER — SALINE SPRAY 0.65 % NA SOLN
1.0000 | NASAL | Status: DC | PRN
  Filled 2020-09-26: qty 44

## 2020-09-26 NOTE — Progress Notes (Signed)
STROKE TEAM PROGRESS NOTE   STROKE TEAM PROGRESS NOTE   INTERVAL HISTORY Cleviprex has been weaned off. Her neurologic exam is stable.  Remains alert and conversant.  Vital signs stable.  Blood pressure adequately controlled.  Neurological exam unchanged We discussed her hemorrhage diagnosis, work up, and plan of care. Her questions were addressed.  Vitals:   09/26/20 0500 09/26/20 0600 09/26/20 0700 09/26/20 0800  BP: (!) 151/75 (!) 141/68 (!) 142/79   Pulse: 75 71 76   Resp: (!) '27 18 20   '$ Temp:    97.7 F (36.5 C)  TempSrc:    Oral  SpO2: 96% 96% 97%   Weight:      Height:       CBC:  Recent Labs  Lab 09/24/20 2137 09/26/20 0323  WBC 10.4 8.3  NEUTROABS 7.9*  --   HGB 12.7 10.6*  HCT 40.8 32.8*  MCV 92.7 89.1  PLT 253 [id number]   Basic Metabolic Panel:  Recent Labs  Lab 09/25/20 0440 09/25/20 0811 09/26/20 0323  NA 137  --  139  K 4.1  --  3.9  CL 101  --  107  CO2 25  --  24  GLUCOSE 424*  --  147*  BUN 27*  --  29*  CREATININE 1.93*  --  2.16*  CALCIUM 8.5*  --  8.3*  MG  --  2.3  --   PHOS  --   --  3.5   Alcohol Level No results for input(s): ETH in the last 168 hours.  IMAGING and DIAGNOSTICS MRI  Redemonstration of acute intraparenchymal hemorrhage within the right side of the pons and within the inferior cerebellar vermis. Small amount of subarachnoid blood in the perimesencephalic cistern, interfoliar subarachnoid spaces of the posterior fossa and dependentn within the occipital horns of the lateral ventricles. No evidence of increasing or new hemorrhage. No hydrocephalus. Extensive chronic small-vessel ischemic changes elsewhere throughout the brain, many with punctate hemosiderin deposition. Overall findings most consistent with hypertensive disease.  CT head Demonstrates evidence of a parenchymal hemorrhage in the right half of the pons with suggestion of local extension into the prepontine cistern. The density seen in the midline posteriorly  may represent some dependent spread of hemorrhage although some artifact does remain.  CTA Head and Neck 1. Negative CTA for large vessel occlusion. No vascular abnormality seen underlying the hemorrhages at the pons and cerebellum. 2. Extensive atheromatous change throughout the carotid siphons with associated moderate to advanced diffuse narrowing. 3. Atheromatous change about the V4 segments bilaterally with associated moderate stenoses. 4. Diffuse interlobular septal thickening within the visualized lungs, suggesting pulmonary interstitial edema.  PHYSICAL EXAM Constitutional: Mildly obese elderly African-American lady not in distress.  Psych: Affect appropriate to situation Eyes: No scleral injection HENT: No OP obstrucion Head: Normocephalic.  Cardiovascular: Normal rate and regular rhythm.  Respiratory: Effort normal and breath sounds normal. GI: Soft.  No distension. There is no tenderness.  Skin: Venous stasis changes in bilateral legs  Neuro: Mental Status: Patient is awake, alert, oriented to person, place, month, year, and situation. Patient is able to give a clear and coherent history. No signs of aphasia or neglect She does have a significant dysarthria Cranial Nerves: II: Visual Fields are full. Pupils are equal, round, and reactive to light.   III,IV, VI: EOMI without ptosis or diplopia.  V: Facial sensation is symmetric to temperature VII: Facial movement is symmetric.  VIII: hearing is intact to voice X: Uvula is  midline and palate elevates symmetrically XI: Shoulder shrug is symmetric. XII: tongue is midline without atrophy or fasciculations.  Motor: Tone is normal. Bulk is normal. 5/5 strength was present in bilateral upper extremities, she does have significant 2/5 weakness of the right lower extremity and 4/5 weakness of the left lower extremity Sensory: Sensation is symmetric to light touch and temperature in the arms and legs. Cerebellar: No clear  ataxia on finger-nose-finger  ASSESSMENT/PLAN [name] is a 86 y.o. female with a history of previous stroke, chronic atrial fibrillation on renal dose Eliquis with right hemiparesis, diabetes who presents with cute onset of headache that started abruptly. Slurred speech noted on arrival. She presented to the [facility] emergency department where head CT revealed a pontine hemorrhage with extension into the prepontine cistern.  She was given Andexxa to reverse her Eliquis. While in the ED she developed shortness of breath and chest x-ray revealed some pulmonary edema and she was started on BiPAP prior to transfer. She was evaluated in the ED and admitted to the neurologic ICU for further evalation and care.   Acute right  intraparenchymal hemorrhage and small amount of subarachnoid blood in the perimesencephalic cistern, interfoliar subarachnoid spaces of the posterior fossa and dependent within the occipital horns of the lateral ventricles while on Eliquis and hypertensive to 150/120 with blood glucose 360.    HCT ode Stroke: parenchymal hemorrhage in the right half of the pons with suggestion of local extension into the prepontine cistern.   CTA head & neck: No LVO or vascular abnormality  MRI  Right pons/cerebellar  IPH with small SAH   2D Echo EF 55-60%, Grade II diastolic dysfunction, No thrombus, wall motion abnormality or shunt found.   VTE prophylaxis - SCDs    Diet   Diet heart healthy/carb modified Room service appropriate? Yes; Fluid consistency: Thin    On Eliquis 2.'5mg'$  BID prior to admission  No anticoagulants or antiplatelets in setting of hemorrhage  Transfer to floor today. Medical hospitalists, Dr. Wynelle Cleveland, contacted to assume care per hemorrhage protocol. Dr.   Therapy recommendations:  CIR  Disposition:  CIR pending auth/acceptance  Hypertension,   Home meds: Hydralazine '75mg'$  BID  Unstable upon arrival with emergency: [id number] systolic  Requiring cleviprex  infusion, will attempt to wean with restart of home meds.   Hypoxia in the setting of decompensated CHF Was on bipap, now weaned to nasal cannula Holding home diuretic Management per CCM, appreciated  Hyperlipidemia  Home meds:  Zocor '20mg'$    LDL 75, goal < 70  Hold statin in setting of hemorrhage  Consider statin at discharge  AKI   Management per CCM   Diabetes type II Uncontrolled  Insulin drip initially required, no transitioned   Home meds: Novolog SSI with meals, Levemir 80units at hs,   HgbA1c 8.9, goal < 7.0  CBGs Recent Labs    09/25/20 2346 09/26/20 0056 09/26/20 0409  GLUCAP 174* 158* 147*      SSI  Diabetes coordinator following, appreciated. Recommending family be involved in management due to cognitive/memory deficits.   Other Stroke Risk Factors  Advanced Age >/= 9   Associated with increased stroke risk, recommend weight loss, diet and exercise as appropriate   High risk for Obstructive sleep apnea  Diastolic congestive heart failure  Patient continues to do well and remained stable.  Mobilize out of bed.  Therapy consults.  Transfer to neurology floor bed.  Transfer to inpatient rehab in the next few days.  No family available at the bedside for discussion today.  Will consult medical hospitalist team to assume care.This patient is critically ill and at significant risk of neurological worsening, death and care requires constant monitoring of vital signs, hemodynamics,respiratory and cardiac monitoring, extensive review of multiple databases, frequent neurological assessment, discussion with family, other specialists and medical decision making of high complexity.I have made any additions or clarifications directly to the above note.This critical care time does not reflect procedure time, or teaching time or supervisory time of PA/NP/Med Resident etc but could involve care discussion time.  I spent 30 minutes of neurocritical care time  in the  care of  this patient.      Antony Contras, [facility] day # 2 To contact Stroke Continuity provider, please refer to [email or web address] After hours, contact General Neurology

## 2020-09-26 NOTE — Progress Notes (Signed)
Inpatient Rehab Admissions Coordinator:   I spoke to patient's daughter, Aniceto Boss, to discuss recommendations for CIR and to review goals/expectations.  We discussed that ELOS would be about 3.5 weeks and dependent on patient progress.  We discussed that at initial discharge pt would likely need more help than her baseline, with assist to get up, move around, and complete her ADLs (bathing/dressing/etc).  Aniceto Boss states that she has 2 sisters and 5 brothers and they are capable of providing any assist necessary for her at discharge.  Prior to admission pt was living with one of her sons in a rental home, and for now the plan is to return there; however there are multiple options for discharge location pending patient progress and needs at discharge.  I explained insurance authorization process to [facility] and let her know that SNF following CIR was most likely not an option.  She verbalized understanding and I will start the process today.    Shann Medal, PT, DPT Admissions Coordinator [phone] 09/26/20  12:28 PM

## 2020-09-26 NOTE — Progress Notes (Signed)
NAME:  [name], MRN:  [id number], DOB:  June 17, 1934, LOS: 2 ADMISSION DATE:  09/24/2020, CONSULTATION DATE: 09/25/2020 REFERRING MD: Dr. Leonel Ramsay CHIEF COMPLAINT: Pulmonary edema  History of Present Illness:  This is an 86 year old female who presented to the emergency room from home.  She presented with acute onset severe headache that started shortly prior to arrival.  On arrival to the emergency room she was found to have a pontine hemorrhage with extension into the prepontine cistern.  While in the emergency room she was noted to have some shortness of breath and pulmonary edema on chest x-ray therefore she was started on BiPAP.  Pertinent  Medical History  CHF CKD Hypertension GERD Diabetes mellitus type 2  Significant Hospital Events: Including procedures, antibiotic start and stop dates in addition to other pertinent events    Admitted 5/10  Objective   Blood pressure (!) 142/79, pulse 76, temperature 99.5 F (37.5 C), temperature source Oral, resp. rate 20, height '5\' 8"'$  (1.727 m), weight 90.7 kg, SpO2 97 %.        Intake/Output Summary (Last 24 hours) at 09/26/2020 0717 Last data filed at 09/26/2020 0600 Gross per 24 hour  Intake 321.85 ml  Output 750 ml  Net -428.15 ml   Filed Weights   09/24/20 1729  Weight: 90.7 kg    Examination: General: Acute on chronic ill appearing elderly female lying in bed, in NAD HEENT: [place]/AT, MM pink/moist, PERRL,  Neuro: Alert and oriented,x3, slight right lower extremity weakness form prior stroke  CV: s1s2 regular rate and rhythm, no murmur, rubs, or gallops,  PULM:  Clear to ascultation bilaterally, no increased work of breathing  GI: soft, bowel sounds active in all 4 quadrants, non-tender, non-distended Extremities: warm/dry, no edema  Skin: no rashes or lesions  Labs/imaging that I have personally reviewed   Mild pulmonary edema    Assessment & Plan:  Acute hemorrhagic pontine hemorrhage -currently with no neuro  deficits; previous RLE deficits from prior CVA in 2003 in which she uses a walker at baseline.  P: Management per neurology  Maintain neuro protective measures; goal for eurothermia, euglycemia, eunatermia, normoxia, and PCO2 goal of 35-40 Nutrition and bowel regiment  Seizure precautions  No longer requiring Cleviprix drip   Decompensated congestive heart failure-mild -ECHO with EF 55-60% with grade II diastolic dysfunction  Lower extremity edema Hypertension -Home medications include; Hydralazine and Imdur P: Continuous telemetry  Daily weight  Hold on further diuretics  Supportive care Continue home medications   Diabetes mellitus type 2- uncontrolled  -Home medications include; SSI and Levemir  -Hemoglobin A1C 9.9 P: Continue SSI and long acting insulin  CBG better controlled   AKI likely due to hypertension and now mild volume depletion  P: Follow renal function / urine output Trend Bmet Avoid nephrotoxins Ensure adequate renal perfusion  Gentle IV bolus followed by PO encouragement    Best practice  Diet:  NPO Pain/Anxiety/Delirium protocol (if indicated): No VAP protocol (if indicated): Not indicated DVT prophylaxis: SCD GI prophylaxis: N/A Glucose control:  SSI Yes Central venous access:  N/A Arterial line:  N/A Foley:  N/A Mobility:  bed rest  PT consulted: N/A Last date of multidisciplinary goals of care discussion [Code Status:  full code Disposition: Stable for transfer out of ICU per primary team   Signature:    Johnsie Cancel, NP-C  Pulmonary & Critical Care Personal contact information can be found on Amion  09/26/2020, 7:19 AM

## 2020-09-26 NOTE — Evaluation (Signed)
Speech Language Pathology Evaluation Patient Details Name: [name] MRN: [id number] DOB: 07-Apr-1935 Today's Date: 09/26/2020 Time: [id number] SLP Time Calculation (min) (ACUTE ONLY): 13 min  Problem List:  Patient Active Problem List   Diagnosis Date Noted  . ICH (intracerebral hemorrhage) ([place]) 09/24/2020  . Pneumonia due to COVID-19 virus 02/12/2020  . Acute hypoxemic respiratory failure due to COVID-19 ([place]) 02/08/2020  . Educated about COVID-19 virus infection 01/11/2020  . Acute urinary retention   . Acute pulmonary edema (HCC)   . AKI (acute kidney injury) ([place]) 07/25/2019  . Acute respiratory failure with hypercapnia ([place]) 07/24/2019  . Pancreatitis, acute 07/23/2019  . Diabetes mellitus type 2 with complications ([place]) [id number]  . Atrial fibrillation, chronic ([place]) 07/23/2019  . Chronic diastolic CHF (congestive heart failure) (HCC)/EF > 60 % 07/23/2019  . Shortness of breath 06/19/2019  . Acute diastolic CHF (congestive heart failure) ([place]) 07/08/2017  . Acute respiratory failure with hypoxemia ([place]) 07/08/2017  . Uncontrolled type 2 diabetes mellitus with diabetic nephropathy, with long-term current use of insulin ([place]) 07/08/2017  . CKD (chronic kidney disease), stage IIIb 07/08/2017  . Essential hypertension 07/08/2017  . Hypertension 06/23/2017  . Prolapsed internal hemorrhoids, grade 3 06/25/2016  . Abdominal pain 05/08/2015  . Dysphagia, pharyngoesophageal phase   . Other hemorrhoids   . Diverticulosis of colon without hemorrhage   . GERD (gastroesophageal reflux disease) 02/28/2014  . Constipation 02/28/2014  . Esophageal dysphagia 02/28/2014  . Rectal bleeding 02/28/2014  . OSTEOARTHRITIS, KNEE, RIGHT, SEVERE 12/13/2008  . DEGENERATIVE DISC DISEASE, LUMBOSACRAL SPINE W/RADICULOPATHY 12/13/2008  . BACK PAIN 12/13/2008  . DIABETES 04/13/2007  . FRACTURE, FEMUR, INTERTROCHANTERIC REGION 04/13/2007   Past Medical History:  Past Medical History:  Diagnosis  Date  . Arthritis   . CHF (congestive heart failure) ([place])   . Chronic diastolic heart failure ([place])   . CKD (chronic kidney disease) stage 3, GFR 30-59 ml/min (HCC)   . Essential hypertension   . GERD (gastroesophageal reflux disease)   . Hemorrhoids   . History of stroke   . Type 2 diabetes mellitus ([place])    Past Surgical History:  Past Surgical History:  Procedure Laterality Date  . BACK SURGERY    . CATARACT EXTRACTION W/PHACO Left 02/28/2013   Procedure: CATARACT EXTRACTION PHACO AND INTRAOCULAR LENS PLACEMENT (IOL) CDE=15.60;  Surgeon: Elta Guadeloupe T. Gershon Crane, MD;  Location: [facility];  Service: Ophthalmology;  Laterality: Left;  . CHOLECYSTECTOMY    . COLONOSCOPY  12/12/2003   RMR: Normal rectum.  Long capacious tortuous colon, colonic mucosa appeared normal  . COLONOSCOPY N/A 03/21/2014   Dr. [name]: internal and external hemorrhoids, torturous colon, colonic diverticulosis   . ESOPHAGOGASTRODUODENOSCOPY  12/28/2001   [id number] sliding hiatal hernia/. Three small bulbar ulcers, two with stigmata of bleeding and these were coagulated using heater probe unit   . ESOPHAGOGASTRODUODENOSCOPY N/A 03/21/2014   Dr. [name]: cervical esophageal web s/p dilation, negative H.pylori   . EYE SURGERY    . HIP FRACTURE SURGERY  2008  . JOINT REPLACEMENT     Rt hip, ????? sounds like just for fracture  . MALONEY DILATION N/A 03/21/2014   Procedure: [name] DILATION;  Surgeon: [name], MD;  Location: [facility];  Service: Endoscopy;  Laterality: N/A;  . SAVORY DILATION N/A 03/21/2014   Procedure: SAVORY DILATION;  Surgeon: [name], MD;  Location: [facility];  Service: Endoscopy;  Laterality: N/A;   HPI:  Pt is a 86 y.o. F admitted with  acute onset headache. 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 While in ED, she had some SOB and pulmonary edema on chest x-ray and was started on BiPAP. Significant PMH: CHF, CKD, HTN, DM2, prior stroke with right  hemiplegia.   Assessment / Plan / Recommendation Clinical Impression  Pt presents with cognitive linguistic deficits post CVA. Pt reports novel changes in speech. Mild dysarthria of speech exhibited including intermittent reduced articulation of sounds and reduced breath support for speech. Pt remained largely intelligible overall. Cognitive deficits including decreased short term memory, reduced executive function skills, mild temporal disorientation, reduced complex problem solving, and reduced thought organization. Pt denies changes from baseline functioning however no family or caregiver present to confirm baseline. She states PLOF her son and grandson live with her but she is often alone while they are at work. She states independence with finanical management and medicine management. Recommend full supervision with all complex ADLs to ensure increased accuracy and pt safety. SLP to follow up to maximize cognitive linguistic recovery    SLP Assessment  SLP Recommendation/Assessment: Patient needs continued Speech Lanaguage Pathology Services SLP Visit Diagnosis: Dysarthria and anarthria (R47.1);Cognitive communication deficit (R41.841)    Follow Up Recommendations  Other (comment) (per PT/OT recommendations)    Frequency and Duration min 1 x/week  1 week      SLP Evaluation Cognition  Overall Cognitive Status: Impaired/Different from baseline Arousal/Alertness: Awake/alert Orientation Level: Oriented to place;Oriented to situation;Other (comment) (disoriented to Day of the Week) Attention: Focused;Sustained Focused Attention: Impaired Focused Attention Impairment: Verbal complex;Functional complex Sustained Attention: Impaired Sustained Attention Impairment: Verbal complex;Functional complex Memory: Impaired Memory Impairment: Decreased short term memory;Decreased recall of new information Decreased Short Term Memory: Verbal complex;Functional complex Problem Solving:  Impaired Problem Solving Impairment: Verbal complex;Functional complex Executive Function: Organizing;Self Monitoring Organizing: Impaired Organizing Impairment: Verbal complex;Functional complex Self Monitoring: Impaired Self Monitoring Impairment: Verbal complex;Functional complex Safety/Judgment: Impaired       Comprehension  Auditory Comprehension Overall Auditory Comprehension: Appears within functional limits for tasks assessed    Expression Expression Primary Mode of Expression: Verbal Verbal Expression Overall Verbal Expression: Appears within functional limits for tasks assessed Written Expression Dominant Hand: Right   Oral / Motor  Oral Motor/Sensory Function Overall Oral Motor/Sensory Function: Within functional limits Motor Speech Overall Motor Speech: Impaired Respiration: Impaired Level of Impairment: Word Phonation: Normal Resonance: Within functional limits Articulation: Impaired Level of Impairment: Phrase Intelligibility: Intelligibility reduced Word: 75-100% accurate Phrase: 75-100% accurate Sentence: 75-100% accurate Conversation: 75-100% accurate Motor Planning: Witnin functional limits Effective Techniques: Slow rate;Increased vocal intensity;Over-articulate;Pause   [place], CCC-SLP Acute Rehabilitation Services   09/26/2020, 2:50 PM

## 2020-09-26 NOTE — Evaluation (Signed)
Speech Language Pathology Evaluation Patient Details Name: [name] MRN: [id number] DOB: August 30, 1934 Today's Date: 09/26/2020 Time: [id number] SLP Time Calculation (min) (ACUTE ONLY): 13 min  Problem List:  Patient Active Problem List   Diagnosis Date Noted  . ICH (intracerebral hemorrhage) ([place]) 09/24/2020  . Pneumonia due to COVID-19 virus 02/12/2020  . Acute hypoxemic respiratory failure due to COVID-19 ([place]) 02/08/2020  . Educated about COVID-19 virus infection 01/11/2020  . Acute urinary retention   . Acute pulmonary edema (HCC)   . AKI (acute kidney injury) ([place]) 07/25/2019  . Acute respiratory failure with hypercapnia ([place]) 07/24/2019  . Pancreatitis, acute 07/23/2019  . Diabetes mellitus type 2 with complications ([place]) [id number]  . Atrial fibrillation, chronic ([place]) 07/23/2019  . Chronic diastolic CHF (congestive heart failure) (HCC)/EF > 60 % 07/23/2019  . Shortness of breath 06/19/2019  . Acute diastolic CHF (congestive heart failure) ([place]) 07/08/2017  . Acute respiratory failure with hypoxemia ([place]) 07/08/2017  . Uncontrolled type 2 diabetes mellitus with diabetic nephropathy, with long-term current use of insulin ([place]) 07/08/2017  . CKD (chronic kidney disease), stage IIIb 07/08/2017  . Essential hypertension 07/08/2017  . Hypertension 06/23/2017  . Prolapsed internal hemorrhoids, grade 3 06/25/2016  . Abdominal pain 05/08/2015  . Dysphagia, pharyngoesophageal phase   . Other hemorrhoids   . Diverticulosis of colon without hemorrhage   . GERD (gastroesophageal reflux disease) 02/28/2014  . Constipation 02/28/2014  . Esophageal dysphagia 02/28/2014  . Rectal bleeding 02/28/2014  . OSTEOARTHRITIS, KNEE, RIGHT, SEVERE 12/13/2008  . DEGENERATIVE DISC DISEASE, LUMBOSACRAL SPINE W/RADICULOPATHY 12/13/2008  . BACK PAIN 12/13/2008  . DIABETES 04/13/2007  . FRACTURE, FEMUR, INTERTROCHANTERIC REGION 04/13/2007   Past Medical History:  Past Medical History:  Diagnosis  Date  . Arthritis   . CHF (congestive heart failure) ([place])   . Chronic diastolic heart failure ([place])   . CKD (chronic kidney disease) stage 3, GFR 30-59 ml/min (HCC)   . Essential hypertension   . GERD (gastroesophageal reflux disease)   . Hemorrhoids   . History of stroke   . Type 2 diabetes mellitus ([place])    Past Surgical History:  Past Surgical History:  Procedure Laterality Date  . BACK SURGERY    . CATARACT EXTRACTION W/PHACO Left 02/28/2013   Procedure: CATARACT EXTRACTION PHACO AND INTRAOCULAR LENS PLACEMENT (IOL) CDE=15.60;  Surgeon: Elta Guadeloupe T. Gershon Crane, MD;  Location: [facility];  Service: Ophthalmology;  Laterality: Left;  . CHOLECYSTECTOMY    . COLONOSCOPY  12/12/2003   RMR: Normal rectum.  Long capacious tortuous colon, colonic mucosa appeared normal  . COLONOSCOPY N/A 03/21/2014   Dr. [name]: internal and external hemorrhoids, torturous colon, colonic diverticulosis   . ESOPHAGOGASTRODUODENOSCOPY  12/28/2001   [id number] sliding hiatal hernia/. Three small bulbar ulcers, two with stigmata of bleeding and these were coagulated using heater probe unit   . ESOPHAGOGASTRODUODENOSCOPY N/A 03/21/2014   Dr. [name]: cervical esophageal web s/p dilation, negative H.pylori   . EYE SURGERY    . HIP FRACTURE SURGERY  2008  . JOINT REPLACEMENT     Rt hip, ????? sounds like just for fracture  . MALONEY DILATION N/A 03/21/2014   Procedure: [name] DILATION;  Surgeon: [name], MD;  Location[facility];  Service: Endoscopy;  Laterality: N/A;  . SAVORY DILATION N/A 03/21/2014   Procedure: SAVORY DILATION;  Surgeon: [name], MD;  Location: [facility];  Service: Endoscopy;  Laterality: N/A;   HPI:  Pt is a 86 y.o. F admitted with  acute onset headache. 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 While in ED, she had some SOB and pulmonary edema on chest x-ray and was started on BiPAP. Significant PMH: CHF, CKD, HTN, DM2, prior stroke with right  hemiplegia.   Assessment / Plan / Recommendation Clinical Impression  Pt presents with cognitive linguistic deficits post CVA. Pt reports novel changes in speech. Mild dysarthria of speech exhibited including intermittent reduced articulation of sounds and reduced breath support for speech. Pt remained largely intelligible overall. Cognitive deficits including decreased short term memory, reduced executive function skills, mild temporal disorientation, reduced complex problem solving, and reduced thought organization. Pt denies changes from baseline functioning however no family or caregiver present to confirm baseline. She states PLOF her son and grandson live with her but she is often alone while they are at work. She states independence with finanical management and medicine management. Recommend full supervision with all complex ADLs to ensure increased accuracy and pt safety. SLP to follow up to maximize cognitive linguistic recovery    SLP Assessment  SLP Recommendation/Assessment: Patient needs continued Speech Lanaguage Pathology Services SLP Visit Diagnosis: Dysarthria and anarthria (R47.1);Cognitive communication deficit (R41.841)    Follow Up Recommendations  Other (comment) (per PT/OT recommendations)    Frequency and Duration min 1 x/week  1 week      SLP Evaluation Cognition  Overall Cognitive Status: Impaired/Different from baseline Arousal/Alertness: Awake/alert Orientation Level: Oriented to place;Oriented to situation;Other (comment) (disoriented to Day of the Week) Attention: Focused;Sustained Focused Attention: Impaired Focused Attention Impairment: Verbal complex;Functional complex Sustained Attention: Impaired Sustained Attention Impairment: Verbal complex;Functional complex Memory: Impaired Memory Impairment: Decreased short term memory;Decreased recall of new information Decreased Short Term Memory: Verbal complex;Functional complex Problem Solving:  Impaired Problem Solving Impairment: Verbal complex;Functional complex Executive Function: Organizing;Self Monitoring Organizing: Impaired Organizing Impairment: Verbal complex;Functional complex Self Monitoring: Impaired Self Monitoring Impairment: Verbal complex;Functional complex Safety/Judgment: Impaired       Comprehension  Auditory Comprehension Overall Auditory Comprehension: Appears within functional limits for tasks assessed    Expression Expression Primary Mode of Expression: Verbal Verbal Expression Overall Verbal Expression: Appears within functional limits for tasks assessed Written Expression Dominant Hand: Right   Oral / Motor  Oral Motor/Sensory Function Overall Oral Motor/Sensory Function: Within functional limits Motor Speech Overall Motor Speech: Impaired Respiration: Impaired Level of Impairment: Word Phonation: Normal Resonance: Within functional limits Articulation: Impaired Level of Impairment: Phrase Intelligibility: Intelligibility reduced Word: 75-100% accurate Phrase: 75-100% accurate Sentence: 75-100% accurate Conversation: 75-100% accurate Motor Planning: Witnin functional limits Effective Techniques: Slow rate;Increased vocal intensity;Over-articulate;Pause   [place], CCC-SLP Acute Rehabilitation Services   09/26/2020, 2:53 PM

## 2020-09-26 NOTE — Progress Notes (Signed)
Physical Therapy Treatment Patient Details Name: [name] MRN: [id number] DOB: 03/13/1935 Today's Date: 09/26/2020    History of Present Illness Pt is a 86 y.o. F admitted with acute onset headache. 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 While in ED, she had some SOB and pulmonary edema on chest x-ray and was started on BiPAP. Significant PMH: CHF, CKD, HTN, DM2, prior stroke with right hemiplegia.    PT Comments    The pt remains agreeable and motivated to progress with therapy as she is optimistic to improve functional strength to return to her prior level of independence with transfers. The pt completed a few exercises focused on LE ROM and mobility against gravity this session, as well as a push-pull exercise to challenge UE and seated balance once seated EOB. The pt attempted x3 sit-stand transfers and x3 lateral scoot transfers with use of momentum, simple sequential cues, and maxA of 2, but was unable to generate hip clearance at this time. The pt will continue to benefit from skilled PT acutely and following d/c to maximize return of function, strength, and independence.     Follow Up Recommendations  CIR     Equipment Recommendations  Wheelchair (measurements PT);Wheelchair cushion (measurements PT)    Recommendations for Other Services       Precautions / Restrictions Precautions Precautions: Fall Restrictions Weight Bearing Restrictions: No    Mobility  Bed Mobility Overal bed mobility: Needs Assistance Bed Mobility: Supine to Sit;Sit to Supine     Supine to sit: Max assist;+2 for physical assistance Sit to supine: Max assist;+2 for physical assistance   General bed mobility comments: Pt initiating movement of BLEs to EOB, but requires maxA to complete. Requiring assist for rolling on side for trunk elevation. Pt requiring maxA +2 for all movements overall and simple commands for follow through.    Transfers Overall  transfer level: Needs assistance Equipment used: 2 person hand held assist Transfers: Sit to/from Stand;Lateral/Scoot Transfers Sit to Stand: Total assist;+2 physical assistance;From elevated surface        Lateral/Scoot Transfers: Total assist;+2 physical assistance General transfer comment: pt attempting to assist with standing from EOB x3 as well as lateral scooting x3 along EOB. Pt with inability to generate hip clearance with any movement despite maxA. attempted use of momentum and elevated surface  Ambulation/Gait             General Gait Details: deferred due to pt inability to stand       Modified Rankin (Stroke Patients Only) Modified Rankin (Stroke Patients Only) Pre-Morbid Rankin Score: Moderate disability Modified Rankin: Severe disability     Balance Overall balance assessment: Needs assistance Sitting-balance support: Feet supported Sitting balance-Leahy Scale: Poor Sitting balance - Comments: initially requiring modA due to posterior lean, able to maintain static sitting without support y end of session Postural control: Posterior lean Standing balance support: Bilateral upper extremity supported Standing balance-Leahy Scale: Zero Standing balance comment: unable to achieve full stand even with maxA of 2                            Cognition Arousal/Alertness: Awake/alert Behavior During Therapy: WFL for tasks assessed/performed Overall Cognitive Status: Impaired/Different from baseline Area of Impairment: Safety/judgement;Awareness                         Safety/Judgement: Decreased awareness of safety;Decreased awareness of deficits Awareness:  Emergent   General Comments: Pt aware of weakness, but with current difficulties understanding importance of challenge to regain strength despite repeated encouragement and discussion. Pt needing cues for sequencing of all movements, decreased initiaion      Exercises Total Joint  Exercises Ankle Circles/Pumps: AROM;Both;10 reps;Supine Long Arc Quad: PROM;Both;10 reps;Seated Bridges: AROM;5 reps;Supine (partial ROM, no clearance of hips) Other Exercises Other Exercises: pt holding PT hands with BUE, pulling forward from posterior lean, scapulat retraction, elbow flexion, and shoulder extension against body weight. x10    General Comments General comments (skin integrity, edema, etc.): VSS on RA      Pertinent Vitals/Pain Pain Assessment: Faces Faces Pain Scale: Hurts even more Pain Location: RLE with initial movement Pain Descriptors / Indicators: Discomfort;Grimacing Pain Intervention(s): Limited activity within patient's tolerance;Monitored during session;Repositioned           PT Goals (current goals can now be found in the care plan section) Acute Rehab PT Goals Patient Stated Goal: get up to the chair PT Goal Formulation: With patient/family Time For Goal Achievement: 10/09/20 Potential to Achieve Goals: Good Progress towards PT goals: Progressing toward goals    Frequency    Min 4X/week      PT Plan Current plan remains appropriate       AM-PAC PT "6 Clicks" Mobility   Outcome Measure  Help needed turning from your back to your side while in a flat bed without using bedrails?: A Lot Help needed moving from lying on your back to sitting on the side of a flat bed without using bedrails?: Total Help needed moving to and from a bed to a chair (including a wheelchair)?: Total Help needed standing up from a chair using your arms (e.g., wheelchair or bedside chair)?: Total Help needed to walk in hospital room?: Total Help needed climbing 3-5 steps with a railing? : Total 6 Click Score: 7    End of Session Equipment Utilized During Treatment: Gait belt Activity Tolerance: Patient limited by fatigue Patient left: in bed;with call bell/phone within reach;with bed alarm set;with family/visitor present Nurse Communication: Mobility status PT  Visit Diagnosis: Other abnormalities of gait and mobility (R26.89);Muscle weakness (generalized) (M62.81);Difficulty in walking, not elsewhere classified (R26.2)     Time: [id number] PT Time Calculation (min) (ACUTE ONLY): 31 min  Charges:  $Therapeutic Exercise: 8-22 mins $Therapeutic Activity: 8-22 mins                     Karma Ganja, PT, DPT   Acute Rehabilitation Department Pager #: [phone]   Otho Bellows 09/26/2020, 12:54 PM

## 2020-09-26 NOTE — Progress Notes (Shared)
PMR Admission Coordinator Pre-Admission Assessment   Patient: [name] is an 86 y.o., female MRN: [id number] DOB: 1935/04/21 Height: '5\' 8"'$  (172.7 cm) Weight: 90.7 kg                                                                                                                                                  Insurance Information HMO: ***    PPO: ***     PCP: ***     IPA: ***     80/20: ***     OTHER: *** PRIMARY: ***      Policy#: ***      Subscriber: *** CM Name: ***      Phone#: ***     Fax#: *** Pre-Cert#: ***      Employer: *** Benefits:  Phone #: ***     Name: *** Eff. Date: ***     Deduct: ***      Out of Pocket Max: ***      Life Max: ***  CIR: ***      SNF: *** Outpatient: ***     Co-Pay: *** Home Health: ***      Co-Pay: *** DME: ***     Co-Pay: *** Providers:  SECONDARY:       Policy#:       Phone#:    Financial Counselor:       Phone#:    The "Data Collection Information Summary" for patients in Inpatient Rehabilitation Facilities with attached "Privacy Act [place] Records" was provided and verbally reviewed with: Family   Emergency Contact Information         Contact Information     Name Relation Home Work [place] Daughter     [phone]    Her[place] Daughter [phone]        Achilles Dunk Daughter [phone]        Pinchback,Davita Granddaughter [phone]   [phone]       Current Medical History  Patient Admitting Diagnosis: CVA   History of Present Illness: [name] is a 86 y.o. right-handed female with history of previous stroke with right hemiparesis, chronic atrial fibrillation maintained on Eliquis, diabetes mellitus, diastolic congestive heart failure, CKD stage III. Presented 09/24/2020 with acute onset of headache as well as slurred speech.  Denied any double vision.  While in the ED noted some shortness of breath pulmonary edema noted on chest x-ray BiPAP initiated.  Cranial CT scan suspicious for  hemorrhage in the posterior fossa.  CT angiogram of head and neck negative for large vessel occlusion.  No vascular abnormalities.  MRI of the brain redemonstrates acute intraparenchymal hemorrhage within the right side of the pons and within the inferior cerebellar vermis.  Small amount of subarachnoid blood in the perimesencephalic cistern, interfoliar subarachnoid spaces of the posterior fossa independent within the occipital horns  of the lateral ventricles.  No hydrocephalus.  Admission chemistries unremarkable except potassium 2.3, glucose 217, creatinine 1.58, urine drug screen negative.  Echocardiogram with ejection fraction of 55 to [id number] grade 2 diastolic dysfunction..  Maintained on Cleviprex for blood pressure control.  Patient did receive Andexxa to reverse Eliquis.  Maintained on a regular consistency diet.  Due to patient decreased functional ability recommendations of physical medicine rehab consult.   Complete NIHSS TOTAL: 5 Glasgow Coma Scale Score: 15   Past Medical History      Past Medical History:  Diagnosis Date  . Arthritis    . CHF (congestive heart failure) ([place])    . Chronic diastolic heart failure ([place])    . CKD (chronic kidney disease) stage 3, GFR 30-59 ml/min ([facility])    . Essential hypertension    . GERD (gastroesophageal reflux disease)    . Hemorrhoids    . History of stroke    . Type 2 diabetes mellitus ([facility])        Family History  family history includes Crohn's disease in her daughter; Diabetes in her brother, sister, and sister; Heart attack in her sister; Hypertension in her mother and sister.   Prior Rehab/Hospitalizations:  Has the patient had prior rehab or hospitalizations prior to admission? Yes   Has the patient had major surgery during 100 days prior to admission? No   Current Medications    Current Facility-Administered Medications:  .  acetaminophen (TYLENOL) tablet 650 mg, 650 mg, Oral, Q4H PRN, 650 mg at 09/25/20 1735 **OR** acetaminophen  (TYLENOL) 160 MG/5ML solution 650 mg, 650 mg, Per Tube, Q4H PRN **OR** acetaminophen (TYLENOL) suppository 650 mg, 650 mg, Rectal, Q4H PRN, Greta Doom, MD .  Chlorhexidine Gluconate Cloth 2 % PADS 6 each, 6 each, Topical, Daily, Greta Doom, MD, 6 each at 09/26/20 0919 .  hydrALAZINE (APRESOLINE) injection 10 mg, 10 mg, Intravenous, Q6H PRN, Merlene Laughter F, NP .  hydrALAZINE (APRESOLINE) tablet 75 mg, 75 mg, Oral, Q12H, Bailey-Modzik, Delila A, NP, 75 mg at 09/26/20 0936 .  insulin aspart (novoLOG) injection 0-20 Units, 0-20 Units, Subcutaneous, TID WC, Davis, Whitney F, NP .  insulin aspart (novoLOG) injection 0-5 Units, 0-5 Units, Subcutaneous, QHS, Davis, Whitney F, NP .  insulin detemir (LEVEMIR) injection 10 Units, 10 Units, Subcutaneous, BID, Merlene Laughter F, NP, 10 Units at 09/26/20 1042 .  isosorbide mononitrate (IMDUR) 24 hr tablet 60 mg, 60 mg, Oral, Daily, Bailey-Modzik, Delila A, NP, 60 mg at 09/26/20 0937 .  pantoprazole (PROTONIX) EC tablet 40 mg, 40 mg, Oral, Daily, Garvin Fila, MD, 40 mg at 09/26/20 0937 .  senna-docusate (Senokot-S) tablet 1 tablet, 1 tablet, Oral, BID, Greta Doom, MD, 1 tablet at 09/26/20 [id number]   Facility-Administered Medications Ordered in Other Encounters:  .  regadenoson (LEXISCAN) injection SOLN 0.4 mg, 0.4 mg, Intravenous, Once, Croitoru, Mihai, MD   Patients Current Diet:     Diet Order                      Diet heart healthy/carb modified Room service appropriate? Yes; Fluid consistency: Thin  Diet effective now                      Precautions / Restrictions Precautions Precautions: Fall Restrictions Weight Bearing Restrictions: No    Has the patient had 2 or more falls or a fall with injury in the past year?Yes   Prior Activity Level Household:  daughter reports limited household ambulation at baseline with RW.  She was mod I with mobility and ADLs at home, and using w/c for community distances    Prior Functional Level Prior Function Level of Independence: Needs assistance Gait / Transfers Assistance Needed: RW; very old w/c ADL's / Homemaking Assistance Needed: ADL by pt; iADL from family; cooking,   Self Care: Did the patient need help bathing, dressing, using the toilet or eating?  Independent   Indoor Mobility: Did the patient need assistance with walking from room to room (with or without device)? Independent   Stairs: Did the patient need assistance with internal or external stairs (with or without device)? Needed some help   Functional Cognition: Did the patient need help planning regular tasks such as shopping or remembering to take medications? Needed some help   Home Assistive Devices / [place] Devices/Equipment: Gilford Rile (specify type) Home Equipment: Walker - 2 wheels,Bedside commode,Wheelchair - manual,Shower seat,Toilet riser,Tub bench,Hospital bed,Adaptive equipment (states she needs a new w/c)   Prior Device Use: Indicate devices/aids used by the patient prior to current illness, exacerbation or injury? Manual wheelchair and Walker   Current Functional Level Cognition   Overall Cognitive Status: Impaired/Different from baseline Orientation Level: Oriented X4 Safety/Judgement: Decreased awareness of deficits General Comments: Pt stating "I was just able to stand." "If it weren't for all of these lines, I could stand." A/O x4. Pt with decreased safety awareness as she kept scooting forward at EOB.    Extremity Assessment (includes Sensation/Coordination)   Upper Extremity Assessment: Generalized weakness,LUE deficits/detail,RUE deficits/detail RUE Deficits / Details: 3/5 MM grade LUE Deficits / Details: 3+/5 MM grade  Lower Extremity Assessment: Generalized weakness,RLE deficits/detail RLE Deficits / Details: weakness, prior CVA RLE Sensation: WNL LLE Deficits / Details: Grossly 3/5     ADLs   Overall ADL's : Needs  assistance/impaired Eating/Feeding: Set up,Sitting Grooming: Set up,Sitting Upper Body Bathing: Minimal assistance,Sitting Lower Body Bathing: Total assistance,+2 for physical assistance,+2 for safety/equipment,Sitting/lateral leans,Sit to/from stand Upper Body Dressing : Minimal assistance,Sitting Lower Body Dressing: Total assistance,+2 for physical assistance,Sitting/lateral leans,Sit to/from stand,Cueing for safety Toilet Transfer: Total assistance,+2 for physical assistance,+2 for safety/equipment Toilet Transfer Details (indicate cue type and reason): attempt at bedside, but pt unable to stand upright enough to clear bottom from bed, but about 2 inches. Toileting- Clothing Manipulation and Hygiene: Total assistance,+2 for physical assistance,+2 for safety/equipment,Sitting/lateral lean,Bed level Functional mobility during ADLs: Maximal assistance,+2 for physical assistance,+2 for safety/equipment,Rolling walker,Cueing for safety,Cueing for sequencing (attempt at EOB) General ADL Comments: Pt limited by decreased strength R > L, decreased ability to care for self and decreased mobility. Pt with poor awareness of deficits.     Mobility   Overal bed mobility: Needs Assistance Bed Mobility: Supine to Sit,Sit to Supine Supine to sit: Max assist,+2 for physical assistance Sit to supine: Max assist,+2 for physical assistance General bed mobility comments: Pt initiating movement of BLEs to EOB and requiring assist for rolling on side for trunk elevation. Pt requiring maxA +2 for all movements overall and simple commands for follow through.     Transfers   Overall transfer level: Needs assistance Equipment used: Rolling walker (2 wheeled) Transfers: Sit to/from Stand Sit to Stand: Max assist,+2 physical assistance General transfer comment: maxA + 2 to stand from edge of bed x 3. 1/3 trials with RW, unable to clear bottom. 2/3 front to front, 3/3 front to front and pt came to partial standing  with severe flexed forward position. (family  stating that is how she would move prior)     Ambulation / Gait / Stairs / Proofreader / Balance Dynamic Sitting Balance Sitting balance - Comments: One instance of left lateral LOB, requiring supervision overall Balance Overall balance assessment: Needs assistance Sitting-balance support: Feet supported Sitting balance-Leahy Scale: Poor Sitting balance - Comments: One instance of left lateral LOB, requiring supervision overall Standing balance support: Bilateral upper extremity supported Standing balance-Leahy Scale: Poor     Special needs/care consideration Diabetic management yes        Previous Home Environment (from acute therapy documentation) Living Arrangements: Children Available Help at Discharge: Family,Available 24 hours/day Type of Home: House Home Layout: One level Home Access: Ramped entrance Bathroom Shower/Tub: Multimedia programmer: Handicapped height Bathroom Accessibility: Yes Home Care Services: No Additional Comments: sleeps in recliner, gets up from [facility] elevated position   Discharge Living Setting Plans for Discharge Living Setting: Patient's home,Lives with (comment) (son (could also d/c to any of her childrens' homes)) Type of Home at Discharge: House Discharge Home Layout: One level Discharge Home Access: Ramped entrance,Stairs to enter Entrance Stairs-Rails: Right,Left Entrance Stairs-Number of Steps: 2 (small steps, which she struggled greatly with at baseline) Discharge Bathroom Shower/Tub: Tub/shower unit Discharge Bathroom Toilet: Standard Discharge Bathroom Accessibility: Yes How Accessible: Accessible via walker Does the patient have any problems obtaining your medications?: No   Social/Family/Support Systems Anticipated Caregiver: 8 children, Aniceto Boss is main contact Anticipated Caregiver's Contact Information: [phone] Ability/Limitations of Caregiver:  n/a Caregiver Availability: 24/7 Discharge Plan Discussed with Primary Caregiver: Yes Is Caregiver In Agreement with Plan?: Yes Does Caregiver/Family have Issues with Lodging/Transportation while Pt is in Rehab?: No     Goals Patient/Family Goal for Rehab: PT mod assist, OT min to mod assist, SLP mod I Expected length of stay: 20-24 days Pt/Family Agrees to Admission and willing to participate: Yes Program Orientation Provided & Reviewed with Pt/Caregiver Including Roles  & Responsibilities: Yes Additional Information Needs: no  Barriers to Discharge: Insurance for SNF coverage     Decrease burden of Care through IP rehab admission: n/a   Possible need for SNF placement upon discharge: Not anticipated.  Pt with good family support from her 8 children.       Patient Condition: This patient's condition remains as documented in the consult dated 09/26/2020, in which the Rehabilitation Physician determined and documented that the patient's condition is appropriate for intensive rehabilitative care in an inpatient rehabilitation facility. Will admit to inpatient rehab pending insurance authorization ***.   Preadmission Screen Completed By:  Michel Santee, PT, DPT 09/26/2020 12:33 PM

## 2020-09-26 NOTE — Progress Notes (Signed)
Inpatient Diabetes Program Recommendations  AACE/ADA: New Consensus Statement on Inpatient Glycemic Control (2015)  Target Ranges:  Prepandial:   less than 140 mg/dL      Peak postprandial:   less than 180 mg/dL (1-2 hours)      Critically ill patients:  140 - 180 mg/dL   Lab Results  Component Value Date   GLUCAP 147 (H) 09/26/2020   HGBA1C 9.9 (H) 09/26/2020    Review of Glycemic Control  Diabetes history: DM 2 Outpatient Diabetes medications: Levemir 80 units qhs, Novolog 0-10 units tid Current orders for Inpatient glycemic control:  Levemir 10 units bid Novolog 0-20 units tid + hs  A1c 9.9 this admission Pt has seen Dr. Dorris Fetch, Endocrinologist in [place], recently (only 2 visits). Last visit was with Rayetta Pigg, NP on 04/19/20. Pt was scheduled with Jearld Fenton, RD for DM education along with her family on 06/12/20. Recommendations at this time was for family to assist with medication administration due to cognitive ability and memory  I recommend the same that pt will need tight glucose control with the assistance of family.  Will follow glucose trends.  Thanks,  Tama Headings RN, MSN, BC-ADM Inpatient Diabetes Coordinator Team Pager [phone] (8a-5p)

## 2020-09-26 NOTE — PMR Pre-admission (Signed)
PMR Admission Coordinator Pre-Admission Assessment  Patient: [name] is an 86 y.o., female MRN: [id number] DOB: 07-19-1934 Height: 5' 8"  (172.7 cm) Weight: 90.7 kg              Insurance Information HMO: yes    PPO:      PCP:      IPA:      80/20:      OTHER:  PRIMARY: [employer]      Policy#: [id number]      Subscriber: pt CM Name: [name]      Phone#: [phone]     Fax#: [phone] Pre-Cert#: [id number] [place] for CIR provided by [name] with updates due to Edwena Felty [phone]) on 5/20   Employer:  Benefits:  Phone #: [phone]     Name:  Eff. Date: 05/19/19     Deduct: $0      Out of Pocket Max: $3900 (met $7.20)      Life Max: n/a  CIR: $295/day for days 1-6 (max $1770)      SNF: 100% Outpatient:      Co-Pay: $10-$20 visits Home Health: 100%      Co-Pay:  DME: 80%     Co-Pay: 20% Providers:  SECONDARY:       Policy#:       Phone#:   Development worker, community:       Phone#:   The Therapist, art Information Summary" for patients in Inpatient Rehabilitation Facilities with attached "Privacy Act [place] Records" was provided and verbally reviewed with: Family  Emergency Contact Information Contact Information    Name Relation Home Work Mobile   [place] Daughter   [phone]   [place] Daughter [phone]     [place] D[place] Daughter [phone]     [place] Granddaughter [phone]  [phone]     Current Medical History  Patient Admitting Diagnosis: CVA  History of Present Illness: [name] is a 86 y.o. right-handed female with history of previous stroke with right hemiparesis, chronic atrial fibrillation maintained on Eliquis, diabetes mellitus, diastolic congestive heart failure, CKD stage III. Presented 09/24/2020 with acute onset of headache as well as slurred speech.  Denied any double vision.  While in the ED noted some shortness of breath pulmonary edema noted on chest x-ray BiPAP initiated.   Cranial CT scan suspicious for hemorrhage in the posterior fossa.  CT angiogram of head and neck negative for large vessel occlusion.  No vascular abnormalities.  MRI of the brain redemonstrates acute intraparenchymal hemorrhage within the right side of the pons and within the inferior cerebellar vermis.  Small amount of subarachnoid blood in the perimesencephalic cistern, interfoliar subarachnoid spaces of the posterior fossa independent within the occipital horns of the lateral ventricles.  No hydrocephalus.  Admission chemistries unremarkable except potassium 2.3, glucose 217, creatinine 1.58, urine drug screen negative.  Echocardiogram with ejection fraction of 55 to 04% grade 2 diastolic dysfunction..  Maintained on Cleviprex for blood pressure control.  Patient did receive Andexxa to reverse Eliquis.  Maintained on a regular consistency diet.  Due to patient decreased functional ability recommendations of physical medicine rehab consult.  Complete NIHSS TOTAL: 2 Glasgow Coma Scale Score: (!) 20  Past Medical History  Past Medical History:  Diagnosis Date  . Arthritis   . CHF (congestive heart failure) ([place])   . Chronic diastolic heart failure ([place])   . CKD (chronic kidney disease) stage 3, GFR 30-59 ml/min ([facility])   . Essential hypertension   . GERD (gastroesophageal  reflux disease)   . Hemorrhoids   . History of stroke   . Type 2 diabetes mellitus ([facility])     Family History  family history includes Crohn's disease in her daughter; Diabetes in her brother, sister, and sister; Heart attack in her sister; Hypertension in her mother and sister.  Prior Rehab/Hospitalizations:  Has the patient had prior rehab or hospitalizations prior to admission? Yes  Has the patient had major surgery during 100 days prior to admission? No  Current Medications   Current Facility-Administered Medications:  .  acetaminophen (TYLENOL) tablet 650 mg, 650 mg, Oral, Q4H PRN, 650 mg at 09/27/20 0206 **OR**  acetaminophen (TYLENOL) 160 MG/5ML solution 650 mg, 650 mg, Per Tube, Q4H PRN **OR** acetaminophen (TYLENOL) suppository 650 mg, 650 mg, Rectal, Q4H PRN, Bailey-Modzik, Delila A, NP .  amLODipine (NORVASC) tablet 10 mg, 10 mg, Oral, Daily, Hall, Carole N, DO, 10 mg at 09/30/20 0905 .  Chlorhexidine Gluconate Cloth 2 % PADS 6 each, 6 each, Topical, Daily, Bailey-Modzik, Delila A, NP, 6 each at 09/30/20 0906 .  hydrALAZINE (APRESOLINE) injection 10 mg, 10 mg, Intravenous, Q6H PRN, Bailey-Modzik, Delila A, NP, 10 mg at 09/28/20 0402 .  hydrALAZINE (APRESOLINE) tablet 100 mg, 100 mg, Oral, Q8H, Rosalin Hawking, MD, 100 mg at 09/30/20 1336 .  insulin aspart (novoLOG) injection 0-20 Units, 0-20 Units, Subcutaneous, TID WC, Bailey-Modzik, Delila A, NP, 7 Units at 09/30/20 1225 .  insulin aspart (novoLOG) injection 0-5 Units, 0-5 Units, Subcutaneous, QHS, Bailey-Modzik, Delila A, NP, 2 Units at 09/28/20 2149 .  insulin aspart (novoLOG) injection 4 Units, 4 Units, Subcutaneous, TID WC, Patrecia Pour, MD, 4 Units at 09/30/20 1225 .  insulin detemir (LEVEMIR) injection 35 Units, 35 Units, Subcutaneous, BID, Patrecia Pour, MD, 35 Units at 09/30/20 0905 .  isosorbide mononitrate (IMDUR) 24 hr tablet 60 mg, 60 mg, Oral, Daily, Bailey-Modzik, Delila A, NP, 60 mg at 09/30/20 0905 .  pantoprazole (PROTONIX) EC tablet 40 mg, 40 mg, Oral, Daily, Bailey-Modzik, Delila A, NP, 40 mg at 09/30/20 0905 .  senna-docusate (Senokot-S) tablet 1 tablet, 1 tablet, Oral, BID, Bailey-Modzik, Delila A, NP, 1 tablet at 09/30/20 0905 .  sodium chloride (OCEAN) 0.65 % nasal spray 1 spray, 1 spray, Each Nare, PRN, Garvin Fila, MD  Facility-Administered Medications Ordered in Other Encounters:  .  regadenoson (LEXISCAN) injection SOLN 0.4 mg, 0.4 mg, Intravenous, Once, Croitoru, Mihai, MD  Patients Current Diet:  Diet Order            DIET DYS 2 Room service appropriate? Yes with Assist; Fluid consistency: Thin  Diet effective now                  Precautions / Restrictions Precautions Precautions: Fall Precaution Comments: Goal SBP < 140 Restrictions Weight Bearing Restrictions: No   Has the patient had 2 or more falls or a fall with injury in the past year?Yes  Prior Activity Level Household: daughter reports limited household ambulation at baseline with RW.  She was mod I with mobility and ADLs at home, and using w/c for community distances  Prior Functional Level Prior Function Level of Independence: Needs assistance Gait / Transfers Assistance Needed: RW; very old w/c ADL's / Homemaking Assistance Needed: ADL by pt; iADL from family; cooking,  Self Care: Did the patient need help bathing, dressing, using the toilet or eating?  Independent  Indoor Mobility: Did the patient need assistance with walking from room to room (with or without device)?  Independent  Stairs: Did the patient need assistance with internal or external stairs (with or without device)? Needed some help  Functional Cognition: Did the patient need help planning regular tasks such as shopping or remembering to take medications? Needed some help  Home Assistive Devices / [place] Devices/Equipment: Gilford Rile (specify type) Home Equipment: Walker - 2 wheels,Bedside commode,Wheelchair - manual,Shower seat,Toilet riser,Tub bench,Hospital bed,Adaptive equipment (states she needs a new w/c)  Prior Device Use: Indicate devices/aids used by the patient prior to current illness, exacerbation or injury? Manual wheelchair and Walker  Current Functional Level Cognition  Arousal/Alertness: Awake/alert Overall Cognitive Status: No family/caregiver present to determine baseline cognitive functioning Orientation Level: Oriented X4 Safety/Judgement: Decreased awareness of safety,Decreased awareness of deficits General Comments: overall WFL for simple tasks, not formally assessed Attention: Focused,Sustained Focused Attention:  Impaired Focused Attention Impairment: Verbal complex,Functional complex Sustained Attention: Impaired Sustained Attention Impairment: Verbal complex,Functional complex Memory: Impaired Memory Impairment: Decreased short term memory,Decreased recall of new information Decreased Short Term Memory: Verbal complex,Functional complex Problem Solving: Impaired Problem Solving Impairment: Verbal complex,Functional complex Executive Function: Organizing,Self Monitoring Organizing: Impaired Organizing Impairment: Verbal complex,Functional complex Self Monitoring: Impaired Self Monitoring Impairment: Verbal complex,Functional complex Safety/Judgment: Impaired    Extremity Assessment (includes Sensation/Coordination)  Upper Extremity Assessment: Generalized weakness,LUE deficits/detail,RUE deficits/detail RUE Deficits / Details: 3/5 MM grade LUE Deficits / Details: 3+/5 MM grade  Lower Extremity Assessment: Generalized weakness,RLE deficits/detail RLE Deficits / Details: weakness, prior CVA RLE Sensation: WNL LLE Deficits / Details: Grossly 3/5    ADLs  Overall ADL's : Needs assistance/impaired Eating/Feeding: Set up,Sitting Grooming: Wash/dry face,Bed level,Set up Grooming Details (indicate cue type and reason): washing face from bed level Upper Body Bathing: Minimal assistance,Sitting Lower Body Bathing: Total assistance,+2 for physical assistance,+2 for safety/equipment,Sitting/lateral leans,Sit to/from stand Upper Body Dressing : Maximal assistance,Bed level Upper Body Dressing Details (indicate cue type and reason): to change gown Lower Body Dressing: Total assistance,+2 for physical assistance,Sitting/lateral leans,Sit to/from stand,Cueing for safety Toilet Transfer: Total assistance,+2 for physical assistance,+2 for safety/equipment Toilet Transfer Details (indicate cue type and reason): deferred OOB mobility d/t elevated BP Toileting- Clothing Manipulation and Hygiene: Total  assistance,+2 for physical assistance,+2 for safety/equipment,Sitting/lateral lean,Bed level Functional mobility during ADLs: Maximal assistance,+2 for physical assistance,+2 for safety/equipment,Rolling walker,Cueing for safety,Cueing for sequencing (attempt at EOB) General ADL Comments: limited session d/t elevated BP ( SBP goals of >140) 159/59, session completed from bed level    Mobility  Overal bed mobility: Needs Assistance Bed Mobility: Supine to Sit,Sit to Supine Supine to sit: Max assist,+2 for physical assistance Sit to supine: Max assist,+2 for physical assistance General bed mobility comments: deferred d/t BP    Transfers  Overall transfer level: Needs assistance Equipment used: 2 person hand held assist Transfers: Sit to/from Stand,Lateral/Scoot Transfers Sit to Stand: Total assist,+2 physical assistance,From elevated surface  Lateral/Scoot Transfers: Total assist,+2 physical assistance General transfer comment: deferred d/t BP    Ambulation / Gait / Stairs / Wheelchair Mobility  Ambulation/Gait General Gait Details: Unable    Posture / Balance Dynamic Sitting Balance Sitting balance - Comments: Posterior assistance of up to modA for static sitting EOB, majority of time only needing minA. Balance Overall balance assessment: Needs assistance Sitting-balance support: Feet supported Sitting balance-Leahy Scale: Poor Sitting balance - Comments: Posterior assistance of up to modA for static sitting EOB, majority of time only needing minA. Postural control: Posterior lean Standing balance support: Bilateral upper extremity supported Standing balance-Leahy Scale: Zero Standing balance comment: Deferred this session  for safety    Special needs/care consideration Diabetic management yes     Previous Home Environment (from acute therapy documentation) Living Arrangements: Children  Lives With: Son (grandson) Available Help at Discharge: Family,Available  PRN/intermittently Type of Home: House Home Layout: One level Home Access: Ramped entrance Bathroom Shower/Tub: Multimedia programmer: Handicapped height Bathroom Accessibility: Yes Clifford: No Additional Comments: sleeps in recliner, gets up from [facility] elevated position  Discharge Living Setting Plans for Discharge Living Setting: Patient's home,Lives with (comment) (son (could also d/c to any of her childrens' homes)) Type of Home at Discharge: House Discharge Home Layout: One level Discharge Home Access: Ramped entrance,Stairs to enter Entrance Stairs-Rails: Right,Left Entrance Stairs-Number of Steps: 2 (small steps, which she struggled greatly with at baseline) Discharge Bathroom Shower/Tub: Tub/shower unit Discharge Bathroom Toilet: Standard Discharge Bathroom Accessibility: Yes How Accessible: Accessible via walker Does the patient have any problems obtaining your medications?: No  Social/Family/Support Systems Anticipated Caregiver: 8 children, Aniceto Boss is main contact Anticipated Caregiver's Contact Information: [phone] Ability/Limitations of Caregiver: n/a Caregiver Availability: 24/7 Discharge Plan Discussed with Primary Caregiver: Yes Is Caregiver In Agreement with Plan?: Yes Does Caregiver/Family have Issues with Lodging/Transportation while Pt is in Rehab?: No   Goals Patient/Family Goal for Rehab: PT mod assist, OT min to mod assist, SLP mod I Expected length of stay: 20-24 days Pt/Family Agrees to Admission and willing to participate: Yes Program Orientation Provided & Reviewed with Pt/Caregiver Including Roles  & Responsibilities: Yes Additional Information Needs: no  Barriers to Discharge: Insurance for SNF coverage   Decrease burden of Care through IP rehab admission: n/a  Possible need for SNF placement upon discharge: Not anticipated.  Pt with good family support from her 8 children.     Patient Condition: This patient's condition  remains as documented in the consult dated 09/26/2020, in which the Rehabilitation Physician determined and documented that the patient's condition is appropriate for intensive rehabilitative care in an inpatient rehabilitation facility. Will admit to inpatient rehab today.  Preadmission Screen Completed By:  Michel Santee, PT, DPT 09/30/2020 3:28 PM ______________________________________________________________________   Discussed status with Dr. Ranell Patrick on 09/30/20 at 3:33 PM  and received approval for admission today.  Admission Coordinator:  Michel Santee, PT, DPT time 3:33 PM Sudie Grumbling 09/30/20

## 2020-09-26 NOTE — H&P (Signed)
Physical Medicine and Rehabilitation Admission H&P    Chief Complaint  Patient presents with   Headache  : HPI: [name] is an 86 year old right-handed female with history of previous stroke with right hemiparesis, chronic atrial fibrillation maintained on Eliquis, diabetes mellitus, diastolic congestive heart failure, CKD stage III.  Per chart review she lives with her son.  1 level home ramped entrance.  Used a rolling walker prior to admission as well as needing assistance for ADLs.  She has multiple family in the area with good support.  Presented 09/24/2020 with acute onset of headache as well as slurred speech.  Denied any double vision.  While in the ED noted some shortness of breath pulmonary edema noted on chest x-ray BiPAP initiated.  Cranial CT scan suspicious for hemorrhage in the posterior fossa.  CT angiogram of head and neck negative for large vessel occlusion.  No vascular abnormalities.  MRI of the brain redemonstrated acute intraparenchymal hemorrhage within the right side of the pons and within the inferior cerebellar vermis.  Small amount of subarachnoid blood in the perimesencephalic cistern, intra foliar subarachnoid spaces of the posterior fossa and dependent within the occipital horn of the lateral ventricles.  No evidence of increasing or new hemorrhage.  No hydrocephalus.  Admission chemistries unremarkable except potassium 2.3 glucose 217 creatinine 1.58 urine drug screen negative.  Echocardiogram with ejection fraction of 55 to [id number] grade 2 diastolic dysfunction.  Maintained on Cleviprex for blood pressure control.  Patient did receive Andexxa to reverse her Eliquis.  Maintained on a regular consistency diet.  Due to patient decreased functional mobility and slurred speech she was admitted for a comprehensive rehab program.  Review of Systems  Constitutional: Negative for chills and fever.  HENT: Negative for hearing loss.   Eyes: Negative for blurred vision and  double vision.  Respiratory: Negative for cough and shortness of breath.   Cardiovascular: Positive for leg swelling. Negative for chest pain and palpitations.  Gastrointestinal: Positive for constipation. Negative for heartburn, nausea and vomiting.       GERD  Genitourinary: Negative for dysuria, flank pain and hematuria.  Musculoskeletal: Positive for joint pain and myalgias.  Skin: Negative for rash.  Neurological: Positive for speech change and headaches.  All other systems reviewed and are negative.  Past Medical History:  Diagnosis Date   Arthritis    CHF (congestive heart failure) (HCC)    Chronic diastolic heart failure (HCC)    CKD (chronic kidney disease) stage 3, GFR 30-59 ml/min (HCC)    Essential hypertension    GERD (gastroesophageal reflux disease)    Hemorrhoids    History of stroke    Type 2 diabetes mellitus ([place])    Past Surgical History:  Procedure Laterality Date   BACK SURGERY     CATARACT EXTRACTION W/PHACO Left 02/28/2013   Procedure: CATARACT EXTRACTION PHACO AND INTRAOCULAR LENS PLACEMENT (IOL) CDE=15.60;  Surgeon: Elta Guadeloupe T. Gershon Crane, MD;  Location: [facility];  Service: Ophthalmology;  Laterality: Left;   CHOLECYSTECTOMY     COLONOSCOPY  12/12/2003   RMR: Normal rectum.  Long capacious tortuous colon, colonic mucosa appeared normal   COLONOSCOPY N/A 03/21/2014   Dr. [name]: internal and external hemorrhoids, torturous colon, colonic diverticulosis    ESOPHAGOGASTRODUODENOSCOPY  12/28/2001   [id number] sliding hiatal hernia/. Three small bulbar ulcers, two with stigmata of bleeding and these were coagulated using heater probe unit    ESOPHAGOGASTRODUODENOSCOPY N/A 03/21/2014   Dr. [name]: cervical esophageal web s/p dilation,  negative H.pylori    EYE SURGERY     HIP FRACTURE SURGERY  2008   JOINT REPLACEMENT     Rt hip, ????? sounds like just for fracture   MALONEY DILATION N/A 03/21/2014   Procedure: [name] DILATION;  Surgeon: [name], MD;  Location:  [facility];  Service: Endoscopy;  Laterality: N/A;   SAVORY DILATION N/A 03/21/2014   Procedure: SAVORY DILATION;  Surgeon: [name], MD;  Location: [facility];  Service: Endoscopy;  Laterality: N/A;   Family History  Problem Relation Age of Onset   Crohn's disease Daughter    Hypertension Mother    Hypertension Sister    Diabetes Brother    Diabetes Sister    Diabetes Sister    Heart attack Sister    Colon cancer Neg Hx    Social History:  reports that she has never smoked. She has never used smokeless tobacco. She reports that she does not drink alcohol and does not use drugs. Allergies: No Known Allergies Medications Prior to Admission  Medication Sig Dispense Refill   acetaminophen (TYLENOL) 325 MG tablet Take 2 tablets (650 mg total) by mouth every 6 (six) hours as needed for mild pain, fever or headache (or Fever >/= 101). 12 tablet 0   albuterol (VENTOLIN HFA) 108 (90 Base) MCG/ACT inhaler Inhale 2 puffs into the lungs every 6 (six) hours as needed for wheezing or shortness of breath. 18 g 0   apixaban (ELIQUIS) 2.5 MG TABS tablet Take 1 tablet (2.5 mg total) by mouth 2 (two) times daily. 60 tablet 2   gabapentin (NEURONTIN) 300 MG capsule Take 300 mg by mouth 2 (two) times daily.      guaiFENesin-dextromethorphan (ROBITUSSIN DM) 100-10 MG/5ML syrup Take 10 mLs by mouth every 4 (four) hours as needed for cough. 118 mL 0   hydrALAZINE (APRESOLINE) 50 MG tablet Take 1.5 tablets (75 mg total) by mouth 2 (two) times daily. (Patient taking differently: Take 50 mg by mouth 2 (two) times daily.) 90 tablet 11   hydrocortisone (ANUSOL-HC) 2.5 % rectal cream Place 1 application rectally 2 (two) times daily as needed for hemorrhoids or anal itching.     insulin aspart (NOVOLOG) 100 UNIT/ML FlexPen Inject 0-10 Units into the skin 3 (three) times daily with meals. insulin aspart (novoLOG) injection 0-10 Units 0-10 Units Subcutaneous, 3 times daily with meals CBG < 70: Implement  Hypoglycemia Standing Orders and refer to Hypoglycemia Standing Orders sidebar report  CBG 70 - 120: 0 unit CBG 121 - 150: 0 unit  CBG 151 - 200: 1 unit CBG 201 - 250: 2 units CBG 251 - 300: 4 units CBG 301 - 350: 6 units  CBG 351 - 400: 8 units  CBG > 400: 10 units 15 mL 11   insulin detemir (LEVEMIR) 100 UNIT/ML injection Inject 0.8 mLs (80 Units total) into the skin at bedtime. 10 mL 2   isosorbide mononitrate (IMDUR) 60 MG 24 hr tablet Take 1 tablet (60 mg total) by mouth daily.     ondansetron (ZOFRAN) 4 MG tablet Take 4 mg by mouth every 6 (six) hours as needed for nausea or vomiting.     pantoprazole (PROTONIX) 40 MG tablet Take 1 tablet by mouth daily.     simvastatin (ZOCOR) 20 MG tablet Take 20 mg by mouth at bedtime.     torsemide (DEMADEX) 20 MG tablet Take 1 tablet (20 mg total) by mouth daily. 30 tablet 3   ascorbic  acid (VITAMIN C) 500 MG tablet Take 1 tablet (500 mg total) by mouth daily. (Patient not taking: No sig reported) 30 tablet 1   dexamethasone (DECADRON) 6 MG tablet Take 1 tablet (6 mg total) by mouth daily with breakfast. (Patient not taking: No sig reported) 5 tablet 0   famotidine (PEPCID) 20 MG tablet Take 1 tablet (20 mg total) by mouth daily. (Patient not taking: No sig reported)     potassium chloride SA (KLOR-CON) 20 MEQ tablet Take 1 tablet (20 mEq total) by mouth daily. (Patient not taking: No sig reported) 90 tablet 0   zinc sulfate 220 (50 Zn) MG capsule Take 1 capsule (220 mg total) by mouth daily. (Patient not taking: No sig reported) 30 capsule 0    Drug Regimen Review Drug regimen was reviewed and remains appropriate with no significant issues identified  Home: Home Living Family/patient expects to be discharged to:: Private residence Living Arrangements: Children Available Help at Discharge: Family,Available 24 hours/day Type of Home: House Home Access: Ramped entrance Home Layout: One level Bathroom Shower/Tub: Multimedia programmer:  Handicapped height Bathroom Accessibility: Yes Home Equipment: Environmental consultant - 2 wheels,Bedside commode,Wheelchair - manual,Shower seat,Toilet riser,Tub bench,Hospital bed,Adaptive equipment (states she needs a new w/c) Adaptive Equipment: Reacher,Sock aid Additional Comments: sleeps in recliner, gets up from [facility] elevated position   Functional History: Prior Function Level of Independence: Needs assistance Gait / Transfers Assistance Needed: RW; very old w/c ADL's / Big Arm: ADL by pt; iADL from family; cooking,  Functional Status:  Mobility: Bed Mobility Overal bed mobility: Needs Assistance Bed Mobility: Supine to Sit,Sit to Supine Supine to sit: Max assist,+2 for physical assistance Sit to supine: Max assist,+2 for physical assistance General bed mobility comments: Pt initiating movement of BLEs to EOB, but requires maxA to complete. Requiring assist for rolling on side for trunk elevation. Pt requiring maxA +2 for all movements overall and simple commands for follow through. Transfers Overall transfer level: Needs assistance Equipment used: 2 person hand held assist Transfers: Sit to/from Stand,Lateral/Scoot Transfers Sit to Stand: Total assist,+2 physical assistance,From elevated surface  Lateral/Scoot Transfers: Total assist,+2 physical assistance General transfer comment: pt attempting to assist with standing from EOB x3 as well as lateral scooting x3 along EOB. Pt with inability to generate hip clearance with any movement despite maxA. attempted use of momentum and elevated surface Ambulation/Gait General Gait Details: deferred due to pt inability to stand    ADL: ADL Overall ADL's : Needs assistance/impaired Eating/Feeding: Set up,Sitting Grooming: Set up,Sitting Upper Body Bathing: Minimal assistance,Sitting Lower Body Bathing: Total assistance,+2 for physical assistance,+2 for safety/equipment,Sitting/lateral leans,Sit to/from stand Upper Body Dressing :  Minimal assistance,Sitting Lower Body Dressing: Total assistance,+2 for physical assistance,Sitting/lateral leans,Sit to/from stand,Cueing for safety Toilet Transfer: Total assistance,+2 for physical assistance,+2 for safety/equipment Toilet Transfer Details (indicate cue type and reason): attempt at bedside, but pt unable to stand upright enough to clear bottom from bed, but about 2 inches. Toileting- Clothing Manipulation and Hygiene: Total assistance,+2 for physical assistance,+2 for safety/equipment,Sitting/lateral lean,Bed level Functional mobility during ADLs: Maximal assistance,+2 for physical assistance,+2 for safety/equipment,Rolling walker,Cueing for safety,Cueing for sequencing (attempt at EOB) General ADL Comments: Pt limited by decreased strength R > L, decreased ability to care for self and decreased mobility. Pt with poor awareness of deficits.  Cognition: Cognition Overall Cognitive Status: Impaired/Different from baseline Orientation Level: Oriented X4 Cognition Arousal/Alertness: Awake/alert Behavior During Therapy: WFL for tasks assessed/performed Overall Cognitive Status: Impaired/Different from baseline Area of Impairment: Safety/judgement,Awareness  Safety/Judgement: Decreased awareness of safety,Decreased awareness of deficits Awareness: Emergent General Comments: Pt aware of weakness, but with current difficulties understanding importance of challenge to regain strength despite repeated encouragement and discussion. Pt needing cues for sequencing of all movements, decreased initiaion  Physical Exam: Blood pressure (!) 160/148, pulse 76, temperature 99.6 F (37.6 C), temperature source Oral, resp. rate (!) 28, height '5\' 8"'$  (1.727 m), weight 90.7 kg, SpO2 97 %. Physical Exam Neurological:     Comments: Patient is alert in no acute distress.  Speech is a bit dysarthric but intelligible.  Oriented to person place.  Follows simple commands.  Fair awareness and insight.      Results for orders placed or performed during the hospital encounter of 09/24/20 (from the past 48 hour(s))  Resp Panel by RT-PCR (Flu A&B, Covid) Nasopharyngeal Swab     Status: None   Collection Time: 09/24/20  8:02 PM   Specimen: Nasopharyngeal Swab; Nasopharyngeal(NP) swabs in vial transport medium  Result Value Ref Range   SARS Coronavirus 2 by RT PCR NEGATIVE NEGATIVE    Comment: (NOTE) SARS-CoV-2 target nucleic acids are NOT DETECTED.  The SARS-CoV-2 RNA is generally detectable in upper respiratory specimens during the acute phase of infection. The lowest concentration of SARS-CoV-2 viral copies this assay can detect is 138 copies/mL. A negative result does not preclude SARS-Cov-2 infection and should not be used as the sole basis for treatment or other patient management decisions. A negative result may occur with  improper specimen collection/handling, submission of specimen other than nasopharyngeal swab, presence of viral mutation(s) within the areas targeted by this assay, and inadequate number of viral copies(<138 copies/mL). A negative result must be combined with clinical observations, patient history, and epidemiological information. The expected result is Negative.  Fact Sheet for Patients:  EntrepreneurPulse.com.au  Fact Sheet for Healthcare Providers:  IncredibleEmployment.be  This test is no t yet approved or cleared by the [place] FDA and  has been authorized for detection and/or diagnosis of SARS-CoV-2 by FDA under an Emergency Use Authorization (EUA). This EUA will remain  in effect (meaning this test can be used) for the duration of the COVID-19 declaration under Section 564(b)(1) of the Act, 21 U.S.C.section 360bbb-3(b)(1), unless the authorization is terminated  or revoked sooner.       Influenza A by PCR NEGATIVE NEGATIVE   Influenza B by PCR NEGATIVE NEGATIVE    Comment: (NOTE) The Xpert Xpress  SARS-CoV-2/FLU/RSV plus assay is intended as an aid in the diagnosis of influenza from Nasopharyngeal swab specimens and should not be used as a sole basis for treatment. Nasal washings and aspirates are unacceptable for Xpert Xpress SARS-CoV-2/FLU/RSV testing.  Fact Sheet for Patients: EntrepreneurPulse.com.au  Fact Sheet for Healthcare Providers: IncredibleEmployment.be  This test is not yet approved or cleared by the [place] FDA and has been authorized for detection and/or diagnosis of SARS-CoV-2 by FDA under an Emergency Use Authorization (EUA). This EUA will remain in effect (meaning this test can be used) for the duration of the COVID-19 declaration under Section 564(b)(1) of the Act, 21 U.S.C. section 360bbb-3(b)(1), unless the authorization is terminated or revoked.  Performed at [facility], [address], [place], [place] [address]   I-stat chem 8, ED (not at [facility] or [facility])     Status: Abnormal   Collection Time: 09/24/20  8:20 PM  Result Value Ref Range   Sodium 145 135 - 145 mmol/L   Potassium 2.3 (LL) 3.5 - 5.1 mmol/L  Chloride 122 (H) 98 - 111 mmol/L   BUN 18 8 - 23 mg/dL   Creatinine, Ser 0.70 0.44 - 1.00 mg/dL   Glucose, Bld 217 (H) 70 - 99 mg/dL    Comment: Glucose reference range applies only to samples taken after fasting for at least 8 hours.   Calcium, Ion 0.53 (LL) 1.15 - 1.40 mmol/L   TCO2 15 (L) 22 - 32 mmol/L   Hemoglobin 7.1 (L) 12.0 - 15.0 g/dL   HCT 21.0 (L) 36.0 - [id number] %  Basic metabolic panel     Status: Abnormal   Collection Time: 09/24/20  9:37 PM  Result Value Ref Range   Sodium 138 135 - 145 mmol/L    Comment: DELTA CHECK NOTED   Potassium 3.9 3.5 - 5.1 mmol/L    Comment: DELTA CHECK NOTED   Chloride 103 98 - 111 mmol/L   CO2 25 22 - 32 mmol/L   Glucose, Bld 345 (H) 70 - 99 mg/dL    Comment: Glucose reference range applies only to samples taken after fasting for at least 8 hours.   BUN 28 (H) 8 -  23 mg/dL   Creatinine, Ser 1.58 (H) 0.44 - 1.00 mg/dL   Calcium 8.8 (L) 8.9 - 10.3 mg/dL   GFR, Estimated 32 (L) >60 mL/min    Comment: (NOTE) Calculated using the CKD-EPI Creatinine Equation (2021)    Anion gap 10 5 - 15    Comment: Performed at [facility], [address], [place], [place] [address]  CBC with Differential     Status: Abnormal   Collection Time: 09/24/20  9:37 PM  Result Value Ref Range   WBC 10.4 4.0 - 10.5 K/uL   RBC 4.40 3.87 - 5.11 MIL/uL   Hemoglobin 12.7 12.0 - 15.0 g/dL   HCT 40.8 36.0 - 46.0 %   MCV 92.7 80.0 - 100.0 fL   MCH 28.9 26.0 - 34.0 pg   MCHC 31.1 30.0 - 36.0 g/dL   RDW 15.1 11.5 - 15.5 %   Platelets 253 150 - 400 K/uL   nRBC 0.0 0.0 - 0.2 %   Neutrophils Relative % 76 %   Neutro Abs 7.9 (H) 1.7 - 7.7 K/uL   Lymphocytes Relative 17 %   Lymphs Abs 1.8 0.7 - 4.0 K/uL   Monocytes Relative 5 %   Monocytes Absolute 0.5 0.1 - 1.0 K/uL   Eosinophils Relative 1 %   Eosinophils Absolute 0.1 0.0 - 0.5 K/uL   Basophils Relative 1 %   Basophils Absolute 0.1 0.0 - 0.1 K/uL   Immature Granulocytes 0 %   Abs Immature Granulocytes 0.04 0.00 - 0.07 K/uL    Comment: Performed at [facility], [address] [place], [place] [address]  Protime-INR     Status: None   Collection Time: 09/24/20  9:37 PM  Result Value Ref Range   Prothrombin Time 13.7 11.4 - 15.2 seconds   INR 1.1 0.8 - 1.2    Comment: (NOTE) INR goal varies based on device and disease states. Performed at [facility], [address], [place], [place] [address]   Brain natriuretic peptide     Status: Abnormal   Collection Time: 09/24/20  9:37 PM  Result Value Ref Range   B Natriuretic Peptide 101.0 (H) 0.0 - 100.0 pg/mL    Comment: Performed at [facility], [address] [place], [place] [address]  MRSA PCR Screening     Status: None   Collection Time: 09/25/20 12:38  AM   Specimen: Nasal Mucosa; Nasopharyngeal  Result Value Ref Range   MRSA by PCR NEGATIVE NEGATIVE    Comment:         The GeneXpert MRSA Assay (FDA approved for NASAL specimens only), is one component of a comprehensive MRSA colonization surveillance program. It is not intended to diagnose MRSA infection nor to guide or monitor treatment for MRSA infections. Performed at [facility], [place] [address], [place], [place] [id number]   Basic metabolic panel     Status: Abnormal   Collection Time: 09/25/20  4:40 AM  Result Value Ref Range   Sodium 137 135 - 145 mmol/L   Potassium 4.1 3.5 - 5.1 mmol/L   Chloride 101 98 - 111 mmol/L   CO2 25 22 - 32 mmol/L   Glucose, Bld 424 (H) 70 - 99 mg/dL    Comment: Glucose reference range applies only to samples taken after fasting for at least 8 hours.   BUN 27 (H) 8 - 23 mg/dL   Creatinine, Ser 1.93 (H) 0.44 - 1.00 mg/dL   Calcium 8.5 (L) 8.9 - 10.3 mg/dL   GFR, Estimated 25 (L) >60 mL/min    Comment: (NOTE) Calculated using the CKD-EPI Creatinine Equation (2021)    Anion gap 11 5 - 15    Comment: Performed at [facility] [address], [place], [place] [address]  Type and screen     Status: None   Collection Time: 09/25/20  4:48 AM  Result Value Ref Range   ABO/RH(D) A POS    Antibody Screen NEG    Sample Expiration      09/28/2020,2359 Performed at [facility], [place] [address] [place], [place] [address]   Glucose, capillary     Status: Abnormal   Collection Time: 09/25/20  7:13 AM  Result Value Ref Range   Glucose-Capillary 446 (H) 70 - 99 mg/dL    Comment: Glucose reference range applies only to samples taken after fasting for at least 8 hours.  Urinalysis, Routine w reflex microscopic Urine, Clean Catch     Status: Abnormal   Collection Time: 09/25/20  8:06 AM  Result Value Ref Range   Color, Urine YELLOW YELLOW   APPearance HAZY (A) CLEAR   Specific Gravity, Urine 1.024 1.005 - 1.030   pH 5.0 5.0 - 8.0   Glucose, UA >=500 (A) NEGATIVE mg/dL   Hgb urine dipstick NEGATIVE NEGATIVE   Bilirubin Urine NEGATIVE NEGATIVE    Ketones, ur NEGATIVE NEGATIVE mg/dL   Protein, ur 100 (A) NEGATIVE mg/dL   Nitrite NEGATIVE NEGATIVE   Leukocytes,Ua NEGATIVE NEGATIVE   RBC / HPF 0-5 0 - 5 RBC/hpf   WBC, UA 11-20 0 - 5 WBC/hpf   Bacteria, UA MANY (A) NONE SEEN   Squamous Epithelial / LPF 0-5 0 - 5   Mucus PRESENT     Comment: Performed at [facility], [address], [place], [place] [address]  Magnesium     Status: None   Collection Time: 09/25/20  8:11 AM  Result Value Ref Range   Magnesium 2.3 1.7 - 2.4 mg/dL    Comment: Performed at [facility] [address], [place], [place] [address]  Urine rapid drug screen (hosp performed)     Status: None   Collection Time: 09/25/20  8:22 AM  Result Value Ref Range   Opiates NONE DETECTED NONE DETECTED   Cocaine NONE DETECTED NONE DETECTED   Benzodiazepines  NONE DETECTED NONE DETECTED   Amphetamines NONE DETECTED NONE DETECTED   Tetrahydrocannabinol NONE DETECTED NONE DETECTED   Barbiturates NONE DETECTED NONE DETECTED    Comment: (NOTE) DRUG SCREEN FOR MEDICAL PURPOSES ONLY.  IF CONFIRMATION IS NEEDED FOR ANY PURPOSE, NOTIFY LAB WITHIN 5 DAYS.  LOWEST DETECTABLE LIMITS FOR URINE DRUG SCREEN Drug Class                     Cutoff (ng/mL) Amphetamine and metabolites    1000 Barbiturate and metabolites    200 Benzodiazepine                 [id number] Tricyclics and metabolites     300 Opiates and metabolites        300 Cocaine and metabolites        300 THC                            50 Performed at [facility], [place] [address] [place], [place] [address]   Glucose, capillary     Status: Abnormal   Collection Time: 09/25/20 10:40 AM  Result Value Ref Range   Glucose-Capillary 431 (H) 70 - 99 mg/dL    Comment: Glucose reference range applies only to samples taken after fasting for at least 8 hours.  Glucose, capillary     Status: Abnormal   Collection Time: 09/25/20 11:31 AM  Result Value Ref Range   Glucose-Capillary 430 (H) 70 - 99 mg/dL     Comment: Glucose reference range applies only to samples taken after fasting for at least 8 hours.  Glucose, capillary     Status: Abnormal   Collection Time: 09/25/20 12:48 PM  Result Value Ref Range   Glucose-Capillary 416 (H) 70 - 99 mg/dL    Comment: Glucose reference range applies only to samples taken after fasting for at least 8 hours.  Glucose, capillary     Status: Abnormal   Collection Time: 09/25/20  1:38 PM  Result Value Ref Range   Glucose-Capillary 378 (H) 70 - 99 mg/dL    Comment: Glucose reference range applies only to samples taken after fasting for at least 8 hours.  Glucose, capillary     Status: Abnormal   Collection Time: 09/25/20  2:54 PM  Result Value Ref Range   Glucose-Capillary 309 (H) 70 - 99 mg/dL    Comment: Glucose reference range applies only to samples taken after fasting for at least 8 hours.  Glucose, capillary     Status: Abnormal   Collection Time: 09/25/20  3:58 PM  Result Value Ref Range   Glucose-Capillary 254 (H) 70 - 99 mg/dL    Comment: Glucose reference range applies only to samples taken after fasting for at least 8 hours.  Glucose, capillary     Status: Abnormal   Collection Time: 09/25/20  5:28 PM  Result Value Ref Range   Glucose-Capillary 177 (H) 70 - 99 mg/dL    Comment: Glucose reference range applies only to samples taken after fasting for at least 8 hours.  Glucose, capillary     Status: Abnormal   Collection Time: 09/25/20  6:36 PM  Result Value Ref Range   Glucose-Capillary 158 (H) 70 - 99 mg/dL    Comment: Glucose reference range applies only to samples taken after fasting for at least 8 hours.  Glucose, capillary     Status: Abnormal   Collection Time: 09/25/20  7:51  PM  Result Value Ref Range   Glucose-Capillary 236 (H) 70 - 99 mg/dL    Comment: Glucose reference range applies only to samples taken after fasting for at least 8 hours.  Glucose, capillary     Status: Abnormal   Collection Time: 09/25/20  9:38 PM  Result  Value Ref Range   Glucose-Capillary 209 (H) 70 - 99 mg/dL    Comment: Glucose reference range applies only to samples taken after fasting for at least 8 hours.  Glucose, capillary     Status: Abnormal   Collection Time: 09/25/20 10:38 PM  Result Value Ref Range   Glucose-Capillary 196 (H) 70 - 99 mg/dL    Comment: Glucose reference range applies only to samples taken after fasting for at least 8 hours.  Glucose, capillary     Status: Abnormal   Collection Time: 09/25/20 11:46 PM  Result Value Ref Range   Glucose-Capillary 174 (H) 70 - 99 mg/dL    Comment: Glucose reference range applies only to samples taken after fasting for at least 8 hours.  Glucose, capillary     Status: Abnormal   Collection Time: 09/26/20 12:56 AM  Result Value Ref Range   Glucose-Capillary 158 (H) 70 - 99 mg/dL    Comment: Glucose reference range applies only to samples taken after fasting for at least 8 hours.  Renal function panel     Status: Abnormal   Collection Time: 09/26/20  3:23 AM  Result Value Ref Range   Sodium 139 135 - 145 mmol/L   Potassium 3.9 3.5 - 5.1 mmol/L   Chloride 107 98 - 111 mmol/L   CO2 24 22 - 32 mmol/L   Glucose, Bld 147 (H) 70 - 99 mg/dL    Comment: Glucose reference range applies only to samples taken after fasting for at least 8 hours.   BUN 29 (H) 8 - 23 mg/dL   Creatinine, Ser 2.16 (H) 0.44 - 1.00 mg/dL   Calcium 8.3 (L) 8.9 - 10.3 mg/dL   Phosphorus 3.5 2.5 - 4.6 mg/dL   Albumin 2.8 (L) 3.5 - 5.0 g/dL   GFR, Estimated 22 (L) >60 mL/min    Comment: (NOTE) Calculated using the CKD-EPI Creatinine Equation (2021)    Anion gap 8 5 - 15    Comment: Performed at [facility] [address] [place], [place] [address]  CBC     Status: Abnormal   Collection Time: 09/26/20  3:23 AM  Result Value Ref Range   WBC 8.3 4.0 - 10.5 K/uL   RBC 3.68 (L) 3.87 - 5.11 MIL/uL   Hemoglobin 10.6 (L) 12.0 - 15.0 g/dL   HCT 32.8 (L) 36.0 - 46.0 %   MCV 89.1 80.0 - 100.0 fL   MCH 28.8  26.0 - 34.0 pg   MCHC 32.3 30.0 - 36.0 g/dL   RDW 15.3 11.5 - 15.5 %   Platelets 213 150 - 400 K/uL   nRBC 0.0 0.0 - 0.2 %    Comment: Performed at [facility], [place] [address], [place], [place] [address]  Hemoglobin A1c     Status: Abnormal   Collection Time: 09/26/20  3:23 AM  Result Value Ref Range   Hgb A1c MFr Bld 9.9 (H) 4.8 - 5.6 %    Comment: (NOTE) Pre diabetes:          5.7%-6.4%  Diabetes:              >6.4%  Glycemic control for   <  7.0% adults with diabetes    Mean Plasma Glucose 237.43 mg/dL    Comment: Performed at [facility] [address] [place], [place] [address]  Lipid panel     Status: Abnormal   Collection Time: 09/26/20  3:23 AM  Result Value Ref Range   Cholesterol 129 0 - 200 mg/dL   Triglycerides 133 <150 mg/dL   HDL 29 (L) >40 mg/dL   Total CHOL/HDL Ratio 4.4 RATIO   VLDL 27 0 - 40 mg/dL   LDL Cholesterol 73 0 - 99 mg/dL    Comment:        Total Cholesterol/HDL:CHD Risk Coronary Heart Disease Risk Table                     Men   Women  1/2 Average Risk   3.4   3.3  Average Risk       5.0   4.4  2 X Average Risk   9.6   7.1  3 X Average Risk  23.4   11.0        Use the calculated Patient Ratio above and the CHD Risk Table to determine the patient's CHD Risk.        ATP III CLASSIFICATION (LDL):  <100     mg/dL   Optimal  100-129  mg/dL   Near or Above                    Optimal  130-159  mg/dL   Borderline  160-189  mg/dL   High  >190     mg/dL   Very High Performed at [facility] [address] [place], [place] [address]   Glucose, capillary     Status: Abnormal   Collection Time: 09/26/20  4:09 AM  Result Value Ref Range   Glucose-Capillary 147 (H) 70 - 99 mg/dL    Comment: Glucose reference range applies only to samples taken after fasting for at least 8 hours.  Glucose, capillary     Status: Abnormal   Collection Time: 09/26/20 11:06 AM  Result Value Ref Range   Glucose-Capillary 258 (H) 70 - 99 mg/dL     Comment: Glucose reference range applies only to samples taken after fasting for at least 8 hours.   CT Angio Head W or Wo Contrast  Result Date: 09/24/2020 CLINICAL DATA:  Initial evaluation for acute intracranial hemorrhage. EXAM: CT ANGIOGRAPHY HEAD AND NECK TECHNIQUE: Multidetector CT imaging of the head and neck was performed using the standard protocol during bolus administration of intravenous contrast. Multiplanar CT image reconstructions and MIPs were obtained to evaluate the vascular anatomy. Carotid stenosis measurements (when applicable) are obtained utilizing NASCET criteria, using the distal internal carotid diameter as the denominator. CONTRAST:  8m OMNIPAQUE IOHEXOL 350 MG/ML SOLN COMPARISON:  Prior head CT from earlier the same day. FINDINGS: CTA NECK FINDINGS Aortic arch: Visualized aortic arch normal in caliber. Origin of the great vessels incompletely assessed on this exam. Atheromatous change about the origin of the left subclavian artery without significant stenosis. Right carotid system: Right CCA patent from its origin to the bifurcation without stenosis. Scattered calcified plaque about the right carotid bulb without significant stenosis. Right ICA patent distally without stenosis, dissection or occlusion. Left carotid system: Origin of the left CCA not visualized. Visualized left CCA patent to the bifurcation without stenosis. Eccentric calcified plaque at the left carotid bulb without significant stenosis. Left ICA patent distally without stenosis,  dissection or occlusion. Vertebral arteries: Both vertebral arteries arise from subclavian arteries. Right vertebral artery dominant. Vertebral arteries patent within the neck without stenosis, dissection or occlusion. Skeleton: No visible acute osseous abnormality. No discrete or worrisome osseous lesions. 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 Other neck: No other visible acute soft tissue abnormality within the neck.  Enlarged multinodular goiter, with dominant 2.6 cm right thyroid nodule. This has been evaluated on previous imaging in 2013. (ref: J Am Coll Radiol. 2015 Feb;12(2): 143-50). No other mass or adenopathy. Upper chest: Diffuse interlobular septal thickening seen within the visualized lungs, suggesting pulmonary interstitial edema. Visualized upper chest demonstrates no other acute finding. Review of the MIP images confirms the above findings CTA HEAD FINDINGS Anterior circulation: Petrous segments patent bilaterally. Extensive atheromatous change throughout the carotid siphons with associated moderate to advanced diffuse narrowing. A1 segments patent bilaterally. Normal anterior communicating artery complex. Anterior cerebral arteries patent to their distal aspects without stenosis. No M1 stenosis or occlusion. Normal MCA bifurcations. Distal MCA branches well perfused and symmetric. Posterior circulation: Atheromatous change within the mid V4 segments bilaterally with associated moderate stenoses. Left PICA origin patent and normal. Right PICA not seen. Basilar patent to its distal aspect without stenosis. Superior cerebellar arteries patent bilaterally. Left PCA supplied via the basilar. Predominant fetal type origin of the right PCA. PCAs patent to their distal aspects without stenosis. Venous sinuses: Patent allowing for timing the contrast bolus. Anatomic variants: Predominant fetal type origin of the right PCA. No intracranial aneurysm. No vascular abnormality seen underlying the hemorrhages at the pons and cerebellum. Review of the MIP images confirms the above findings IMPRESSION: 1. Negative CTA for large vessel occlusion. No vascular abnormality seen underlying the hemorrhages at the pons and cerebellum. 2. Extensive atheromatous change throughout the carotid siphons with associated moderate to advanced diffuse narrowing. 3. Atheromatous change about the V4 segments bilaterally with associated moderate  stenoses. 4. Diffuse interlobular septal thickening within the visualized lungs, suggesting pulmonary interstitial edema. Electronically Signed   By: Jeannine Boga M.D.   On: 09/24/2020 21:57   CT Head Wo Contrast  Addendum Date: 09/24/2020   ADDENDUM REPORT: 09/24/2020 20:39 ADDENDUM: The patient was subsequently sedated and return for repeat imaging which demonstrates evidence of a parenchymal hemorrhage in the right half of the pons with suggestion of local extension into the prepontine cistern. The density seen in the midline posteriorly may represent some dependent spread of hemorrhage although some artifact does remain. Critical Value/emergent results were called by telephone at the time of interpretation on 09/24/2020 at 8:39 pm to Dr. Varney Biles , who verbally acknowledged these results. Electronically Signed   By: Inez Catalina M.D.   On: 09/24/2020 20:39   Result Date: 09/24/2020 CLINICAL DATA:  Headaches, no known injury, initial encounter EXAM: CT HEAD WITHOUT CONTRAST TECHNIQUE: Contiguous axial images were obtained from the base of the skull through the vertex without intravenous contrast. COMPARISON:  05/02/2008 FINDINGS: Brain: Images are significantly limited by patient motion artifact. Mild atrophic changes and chronic white matter ischemic changes are seen. There are areas of increased attenuation anterior to the pons as well as posteriorly in the midline in the cerebellum. These are likely related to artifact although the possibility of underlying hemorrhage could not be totally excluded on this exam. Repeat imaging when the patient can better tolerate the exam is recommended. Vascular: No hyperdense vessel or unexpected calcification. Skull: Normal. Negative for fracture or focal lesion. Sinuses/Orbits: No  acute finding. Other: None. IMPRESSION: Significantly limited exam. There are findings suspicious for hemorrhage in the posterior fossa and given the clinical history repeat  imaging with sedation is recommended to allow the patient to better tolerate the procedure. No other focal abnormality is noted. Electronically Signed: By: Inez Catalina M.D. On: 09/24/2020 19:55   CT Angio Neck W and/or Wo Contrast  Result Date: 09/24/2020 CLINICAL DATA:  Initial evaluation for acute intracranial hemorrhage. EXAM: CT ANGIOGRAPHY HEAD AND NECK TECHNIQUE: Multidetector CT imaging of the head and neck was performed using the standard protocol during bolus administration of intravenous contrast. Multiplanar CT image reconstructions and MIPs were obtained to evaluate the vascular anatomy. Carotid stenosis measurements (when applicable) are obtained utilizing NASCET criteria, using the distal internal carotid diameter as the denominator. CONTRAST:  19m OMNIPAQUE IOHEXOL 350 MG/ML SOLN COMPARISON:  Prior head CT from earlier the same day. FINDINGS: CTA NECK FINDINGS Aortic arch: Visualized aortic arch normal in caliber. Origin of the great vessels incompletely assessed on this exam. Atheromatous change about the origin of the left subclavian artery without significant stenosis. Right carotid system: Right CCA patent from its origin to the bifurcation without stenosis. Scattered calcified plaque about the right carotid bulb without significant stenosis. Right ICA patent distally without stenosis, dissection or occlusion. Left carotid system: Origin of the left CCA not visualized. Visualized left CCA patent to the bifurcation without stenosis. Eccentric calcified plaque at the left carotid bulb without significant stenosis. Left ICA patent distally without stenosis, dissection or occlusion. Vertebral arteries: Both vertebral arteries arise from subclavian arteries. Right vertebral artery dominant. Vertebral arteries patent within the neck without stenosis, dissection or occlusion. Skeleton: No visible acute osseous abnormality. No discrete or worrisome osseous lesions. Congenital fusion of the C2 and C3  vertebral bodies noted. Other neck: No other visible acute soft tissue abnormality within the neck. Enlarged multinodular goiter, with dominant 2.6 cm right thyroid nodule. This has been evaluated on previous imaging in 2013. (ref: J Am Coll Radiol. 2015 Feb;12(2): 143-50). No other mass or adenopathy. Upper chest: Diffuse interlobular septal thickening seen within the visualized lungs, suggesting pulmonary interstitial edema. Visualized upper chest demonstrates no other acute finding. Review of the MIP images confirms the above findings CTA HEAD FINDINGS Anterior circulation: Petrous segments patent bilaterally. Extensive atheromatous change throughout the carotid siphons with associated moderate to advanced diffuse narrowing. A1 segments patent bilaterally. Normal anterior communicating artery complex. Anterior cerebral arteries patent to their distal aspects without stenosis. No M1 stenosis or occlusion. Normal MCA bifurcations. Distal MCA branches well perfused and symmetric. Posterior circulation: Atheromatous change within the mid V4 segments bilaterally with associated moderate stenoses. Left PICA origin patent and normal. Right PICA not seen. Basilar patent to its distal aspect without stenosis. Superior cerebellar arteries patent bilaterally. Left PCA supplied via the basilar. Predominant fetal type origin of the right PCA. PCAs patent to their distal aspects without stenosis. Venous sinuses: Patent allowing for timing the contrast bolus. Anatomic variants: Predominant fetal type origin of the right PCA. No intracranial aneurysm. No vascular abnormality seen underlying the hemorrhages at the pons and cerebellum. Review of the MIP images confirms the above findings IMPRESSION: 1. Negative CTA for large vessel occlusion. No vascular abnormality seen underlying the hemorrhages at the pons and cerebellum. 2. Extensive atheromatous change throughout the carotid siphons with associated moderate to advanced  diffuse narrowing. 3. Atheromatous change about the V4 segments bilaterally with associated moderate stenoses. 4. Diffuse interlobular septal thickening within the visualized  lungs, suggesting pulmonary interstitial edema. Electronically Signed   By: Jeannine Boga M.D.   On: 09/24/2020 21:57   MR BRAIN WO CONTRAST  Result Date: 09/25/2020 CLINICAL DATA:  Previous stroke. Chronic atrial fibrillation. Right hemiparesis. New onset headache. Intracranial hemorrhage. EXAM: MRI HEAD WITHOUT CONTRAST TECHNIQUE: Multiplanar, multiecho pulse sequences of the brain and surrounding structures were obtained without intravenous contrast. COMPARISON:  CT studies yesterday. FINDINGS: Brain: Acute intraparenchymal hemorrhage within the right side of the pons is redemonstrated without appreciable change. Acute hemorrhage in the inferior cerebellar vermis is redemonstrated without appreciable change. Chronic small-vessel ischemic changes are present elsewhere throughout the pons. Few other old small vessel cerebellar infarctions. Cerebral hemispheres show chronic small-vessel disease throughout the thalami and hemispheric white matter. Few old basal ganglia infarctions. No large vessel territory infarction. Numerous scattered punctate foci of hemosiderin deposition related to some of the old small vessel infarctions. Small amount of blood dependent within the occipital horns of the lateral ventricles and in the perimesencephalic cistern. No hydrocephalus. No sign of mass lesion. Vascular: Major vessels at the base of the brain show flow. Skull and upper cervical spine: Negative Sinuses/Orbits: Clear/normal Other: None IMPRESSION: Redemonstration of acute intraparenchymal hemorrhage within the right side of the pons and within the inferior cerebellar vermis. Small amount of subarachnoid blood in the perimesencephalic cistern, interfoliar subarachnoid spaces of the posterior fossa and dependent within the occipital horns of  the lateral ventricles. No evidence of increasing or new hemorrhage. No hydrocephalus. Extensive chronic small-vessel ischemic changes elsewhere throughout the brain, many with punctate hemosiderin deposition. Overall findings most consistent with hypertensive disease. Electronically Signed   By: Nelson Chimes M.D.   On: 09/25/2020 07:33   DG Chest Port 1 View  Result Date: 09/24/2020 CLINICAL DATA:  Shortness of breath cough EXAM: PORTABLE CHEST 1 VIEW COMPARISON:  02/10/2020 FINDINGS: Cardiac shadow remains enlarged. Mild vascular congestion is noted although improved when compared with the prior exam. No sizable effusion is noted. No pneumothorax is seen. No bony abnormality is noted. IMPRESSION: Mild CHF although improved from the prior study. Electronically Signed   By: Inez Catalina M.D.   On: 09/24/2020 18:49   ECHOCARDIOGRAM COMPLETE  Result Date: 09/25/2020    ECHOCARDIOGRAM REPORT   Patient Name:   ANOUSHKA JUNK Date of Exam: 09/25/2020 Medical Rec #:  [id number]    Height:       68.0 in Accession #:    [id number]   Weight:       200.0 lb Date of Birth:  [birth year]     BSA:          2.044 m Patient Age:    3 years     BP:           118/43 mmHg Patient Gender: F            HR:           76 bpm. Exam Location:  Inpatient Procedure: 2D Echo Indications:    stroke  History:        Patient has prior history of Echocardiogram examinations, most                 recent 06/29/2019. CHF, Arrythmias:Atrial Fibrillation; Risk                 Factors:Diabetes and Hypertension.  Sonographer:    Johny Chess Referring Phys: [phone] Kaplan  Sonographer Comments: Image acquisition challenging due to patient body habitus. IMPRESSIONS  1. Left ventricular ejection fraction, by estimation, is 55 to 60%. The left ventricle has normal function. The left ventricle has no regional wall motion abnormalities. Left ventricular diastolic parameters are consistent with Grade II diastolic dysfunction  (pseudonormalization). Elevated left atrial pressure.  2. Right ventricular systolic function is normal. The right ventricular size is normal.  3. The mitral valve is normal in structure. Trivial mitral valve regurgitation. No evidence of mitral stenosis.  4. The aortic valve has an indeterminant number of cusps. Aortic valve regurgitation is not visualized. No aortic stenosis is present.  5. The inferior vena cava is normal in size with greater than 50% respiratory variability, suggesting right atrial pressure of 3 mmHg. FINDINGS  Left Ventricle: Left ventricular ejection fraction, by estimation, is 55 to 60%. The left ventricle has normal function. The left ventricle has no regional wall motion abnormalities. The left ventricular internal cavity size was normal in size. There is  no left ventricular hypertrophy. Left ventricular diastolic parameters are consistent with Grade II diastolic dysfunction (pseudonormalization). Elevated left atrial pressure. Right Ventricle: The right ventricular size is normal.Right ventricular systolic function is normal. Left Atrium: Left atrial size was normal in size. Right Atrium: Right atrial size was normal in size. Pericardium: There is no evidence of pericardial effusion. Mitral Valve: The mitral valve is normal in structure. Mild mitral annular calcification. Trivial mitral valve regurgitation. No evidence of mitral valve stenosis. Tricuspid Valve: The tricuspid valve is normal in structure. Tricuspid valve regurgitation is trivial. No evidence of tricuspid stenosis. Aortic Valve: The aortic valve has an indeterminant number of cusps. Aortic valve regurgitation is not visualized. No aortic stenosis is present. Pulmonic Valve: The pulmonic valve was not well visualized. Pulmonic valve regurgitation is not visualized. No evidence of pulmonic stenosis. Aorta: The aortic root is normal in size and structure. Venous: The inferior vena cava is normal in size with greater than 50%  respiratory variability, suggesting right atrial pressure of 3 mmHg. IAS/Shunts: No atrial level shunt detected by color flow Doppler.  LEFT VENTRICLE PLAX 2D LVIDd:         4.70 cm  Diastology LVIDs:         3.40 cm  LV e' medial:    4.57 cm/s LV PW:         0.90 cm  LV E/e' medial:  33.3 LV IVS:        1.10 cm  LV e' lateral:   7.18 cm/s LVOT diam:     2.10 cm  LV E/e' lateral: 21.2 LV SV:         73 LV SV Index:   36 LVOT Area:     3.46 cm  RIGHT VENTRICLE             IVC RV S prime:     13.60 cm/s  IVC diam: 2.20 cm TAPSE (M-mode): 1.5 cm LEFT ATRIUM             Index LA diam:        3.90 cm 1.91 cm/m LA Vol (A2C):   51.9 ml 25.39 ml/m LA Vol (A4C):   63.4 ml 31.02 ml/m LA Biplane Vol: 61.0 ml 29.85 ml/m  AORTIC VALVE LVOT Vmax:   106.00 cm/s LVOT Vmean:  72.100 cm/s LVOT VTI:    0.211 m  AORTA Ao Root diam: 2.90 cm MITRAL VALVE MV Area (PHT): 3.72 cm     SHUNTS MV Decel Time: 204 msec     Systemic VTI:  0.21 m MV E velocity: 152.00 cm/s  Systemic Diam: 2.10 cm MV A velocity: 93.00 cm/s MV E/A ratio:  1.63 Kirk Ruths MD Electronically signed by Kirk Ruths MD Signature Date/Time: 09/25/2020/4:45:44 PM    Final        Medical Problem List and Plan: 1.  Headache with slurred speech and decreased functional mobility secondary to intraparenchymal hemorrhage involving the right pons and cerebellar vermis, SAH as well as history of CVA with residual right-sided weakness  -patient may  shower  -ELOS/Goals:  2.  Antithrombotics: -DVT/anticoagulation: SCDs  -antiplatelet therapy: N/A 3. Pain Management: Tylenol as needed 4. Mood: Provide emotional support  -antipsychotic agents: N/A 5. Neuropsych: This patient he is capable of making decisions on her own behalf. 6. Skin/Wound Care: Routine skin checks 7. Fluids/Electrolytes/Nutrition: Routine in and outs with follow-up chemistries 8.  Dysphagia.  Dysphagia #2 thin liquids.  Follow-up speech therapy 9.  Hypertension.  Norvasc 10 mg daily,  Imdur 60 mg daily, hydralazine 100 mg every 8 hours.  Monitor with increased mobility 10.  History of atrial fibrillation.  Chronic Eliquis reversed with Andexxa.  Continue to hold blood thinner due to [facility].  Cardiac rate controlled 11.  Diabetes mellitus.  Hemoglobin A1c 9.9.  NovoLog 4 units 3 times daily, Levemir 35 units twice daily.  Check blood sugars before meals and at bedtime 12.  Diastolic congestive heart failure.  Monitor for any signs of fluid overload 13.  CKD stage III.  Follow-up chemistries 14.  GERD.  Protonix    Cathlyn Parsons, PA-C 09/26/2020

## 2020-09-27 DIAGNOSIS — Z794 Long term (current) use of insulin: Secondary | ICD-10-CM

## 2020-09-27 DIAGNOSIS — I10 Essential (primary) hypertension: Secondary | ICD-10-CM

## 2020-09-27 DIAGNOSIS — E1169 Type 2 diabetes mellitus with other specified complication: Secondary | ICD-10-CM

## 2020-09-27 LAB — BASIC METABOLIC PANEL
Anion gap: 6 (ref 5–15)
BUN: 25 mg/dL — ABNORMAL HIGH (ref 8–23)
CO2: 25 mmol/L (ref 22–32)
Calcium: 8.6 mg/dL — ABNORMAL LOW (ref 8.9–10.3)
Chloride: 107 mmol/L (ref 98–111)
Creatinine, Ser: 1.78 mg/dL — ABNORMAL HIGH (ref 0.44–1.00)
GFR, Estimated: 28 mL/min — ABNORMAL LOW (ref >60)
Glucose, Bld: 220 mg/dL — ABNORMAL HIGH (ref 70–99)
Potassium: 3.9 mmol/L (ref 3.5–5.1)
Sodium: 138 mmol/L (ref 135–145)

## 2020-09-27 LAB — CBC
HCT: 31.5 % — ABNORMAL LOW (ref 36.0–46.0)
Hemoglobin: 10.1 g/dL — ABNORMAL LOW (ref 12.0–15.0)
MCH: 28.5 pg (ref 26.0–34.0)
MCHC: 32.1 g/dL (ref 30.0–36.0)
MCV: 89 fL (ref 80.0–100.0)
Platelets: 227 10*3/uL (ref 150–400)
RBC: 3.54 MIL/uL — ABNORMAL LOW (ref 3.87–5.11)
RDW: 15.2 % (ref 11.5–15.5)
WBC: 8 10*3/uL (ref 4.0–10.5)
nRBC: 0 % (ref 0.0–0.2)

## 2020-09-27 LAB — GLUCOSE, CAPILLARY
Glucose-Capillary: 250 mg/dL — ABNORMAL HIGH (ref 70–99)
Glucose-Capillary: 241 mg/dL — ABNORMAL HIGH (ref 70–99)
Glucose-Capillary: 254 mg/dL — ABNORMAL HIGH (ref 70–99)
Glucose-Capillary: 236 mg/dL — ABNORMAL HIGH (ref 70–99)

## 2020-09-27 MED ORDER — AMLODIPINE BESYLATE 10 MG PO TABS: 10.0000 mg | ORAL_TABLET | Freq: Every day | ORAL | Status: DC

## 2020-09-27 MED ORDER — INSULIN DETEMIR 100 UNIT/ML ~~LOC~~ SOLN
15.0000 [IU] | Freq: Two times a day (BID) | SUBCUTANEOUS | Status: DC
  Administered 2020-09-27 (×2): 15 [IU] via SUBCUTANEOUS
  Filled 2020-09-27 (×4): qty 0.15

## 2020-09-27 NOTE — Progress Notes (Signed)
Inpatient Diabetes Program Recommendations  AACE/ADA: New Consensus Statement on Inpatient Glycemic Control (2015)  Target Ranges:  Prepandial:   less than 140 mg/dL      Peak postprandial:   less than 180 mg/dL (1-2 hours)      Critically ill patients:  140 - 180 mg/dL   Lab Results  Component Value Date   GLUCAP 250 (H) 09/27/2020   HGBA1C 9.9 (H) 09/26/2020    Review of Glycemic Control  Diabetes history: DM 2 Outpatient Diabetes medications: Levemir 80 units qhs, Novolog 0-10 units tid Current orders for Inpatient glycemic control:  Levemir 10 units bid Novolog 0-20 units tid + hs  A1c 9.9  Spoke with Daughter Aniceto Boss over the phone. Pt does not prefer Dr. Dorris Fetch for diabetes management. Discussed A1c level. Pt has difficulty coming to [facility] for appointments. Suggested a NP that works with Dr. Dorris Fetch. Daughter says pt understands how to give a sliding scale, however, has a hard time with how 1 units of insulin could help her glucose levels when she was use to taking 30 units of Novolog with meals. She was told 30 units was too much and was given a sliding scale instead.   Daughter would like information on diet for when pt comes home. Pts diet could improve at home. Will need to follow glucose trends for dosing of home insulin. Will need to discuss with pt balancing out her meals while reducing carbohydrate intake.  Thanks,  Tama Headings RN, MSN, BC-ADM Inpatient Diabetes Coordinator Team Pager [phone] (8a-5p)

## 2020-09-27 NOTE — Progress Notes (Addendum)
STROKE TEAM PROGRESS NOTE   STROKE TEAM PROGRESS NOTE   INTERVAL HISTORY   Her neurologic exam is stable.  Remains alert and conversant.  Vital signs stable.  Blood pressure adequately controlled.  Neurological exam unchanged We discussed her hemorrhage diagnosis, work up, and plan of care. Her questions were addressed.  She has insurance authorization for rehab transfer but no bed is available today Vitals:   09/27/20 0418 09/27/20 0520 09/27/20 0751 09/27/20 1124  BP: (!) 186/72 (!) 158/80 (!) 190/84 (!) 167/62  Pulse: 83  89 86  Resp: '19  18 18  '$ Temp: 98.4 F (36.9 C)  97.8 F (36.6 C) 98.3 F (36.8 C)  TempSrc: Oral  Oral Oral  SpO2: 97%  100% 99%  Weight:      Height:       CBC:  Recent Labs  Lab 09/24/20 2137 09/26/20 0323 09/27/20 0127  WBC 10.4 8.3 8.0  NEUTROABS 7.9*  --   --   HGB 12.7 10.6* 10.1*  HCT 40.8 32.8* 31.5*  MCV 92.7 89.1 89.0  PLT 253 213 [id number]   Basic Metabolic Panel:  Recent Labs  Lab 09/25/20 0811 09/26/20 0323 09/27/20 0127  NA  --  139 138  K  --  3.9 3.9  CL  --  107 107  CO2  --  24 25  GLUCOSE  --  147* 220*  BUN  --  29* 25*  CREATININE  --  2.16* 1.78*  CALCIUM  --  8.3* 8.6*  MG 2.3  --   --   PHOS  --  3.5  --    IMAGING and DIAGNOSTICS MRI 09/25/20 Redemonstration of acute intraparenchymal hemorrhage within the right side of the pons and within the inferior cerebellar vermis. Small amount of subarachnoid blood in the perimesencephalic cistern, interfoliar subarachnoid spaces of the posterior fossa and dependentn within the occipital horns of the lateral ventricles. No evidence of increasing or new hemorrhage. No hydrocephalus. Extensive chronic small-vessel ischemic changes elsewhere throughout the brain, many with punctate hemosiderin deposition. Overall findings most consistent with hypertensive disease.  CT head 09/24/20 Demonstrates evidence of a parenchymal hemorrhage in the right half of the pons with suggestion of  local extension into the prepontine cistern. The density seen in the midline posteriorly may represent some dependent spread of hemorrhage although some artifact does remain.  CTA Head and Neck 09/24/20 1. Negative CTA for large vessel occlusion. No vascular abnormality seen underlying the hemorrhages at the pons and cerebellum. 2. Extensive atheromatous change throughout the carotid siphons with associated moderate to advanced diffuse narrowing. 3. Atheromatous change about the V4 segments bilaterally with associated moderate stenoses. 4. Diffuse interlobular septal thickening within the visualized lungs, suggesting pulmonary interstitial edema.  PHYSICAL EXAM Constitutional: Mildly obese elderly African-American lady not in distress.  Psych: Affect appropriate to situation Eyes: No scleral injection HENT: No OP obstrucion Head: Normocephalic.  Cardiovascular: Normal rate and regular rhythm.  Respiratory: Effort normal and breath sounds normal. GI: Soft.  No distension. There is no tenderness.  Skin: Venous stasis changes in bilateral legs  Neuro: Mental Status: Patient is awake, alert, oriented to person, place, month, year, and situation. Patient is able to give a clear and coherent history. No signs of aphasia or neglect She does have a significant dysarthria Cranial Nerves: II: Visual Fields are full. Pupils are equal, round, and reactive to light.   III,IV, VI: EOMI without ptosis or diplopia.  V: Facial sensation is symmetric to temperature  VII: Facial movement is symmetric.  VIII: hearing is intact to voice X: Uvula is midline and palate elevates symmetrically XI: Shoulder shrug is symmetric. XII: tongue is midline without atrophy or fasciculations.  Motor: Tone is normal. Bulk is normal. 5/5 strength was present in bilateral upper extremities, she does have significant 2/5 weakness of the right lower extremity and 4/5 weakness of the left lower  extremity Sensory: Sensation is symmetric to light touch and temperature in the arms and legs. Cerebellar: No clear ataxia on finger-nose-finger  ASSESSMENT/PLAN [name] is a 86 y.o. female with a history of previous stroke, chronic atrial fibrillation on renal dose Eliquis with right hemiparesis, diabetes who presents with cute onset of headache that started abruptly. Slurred speech noted on arrival. She presented to the [facility] emergency department where head CT revealed a pontine hemorrhage with extension into the prepontine cistern.  She was given Andexxa to reverse her Eliquis. While in the ED she developed shortness of breath and chest x-ray revealed some pulmonary edema and she was started on BiPAP prior to transfer. She was evaluated in the ED and admitted to the neurologic ICU for further evalation and care. Interval history outlined abo e.e   Acute right  intraparenchymal hemorrhage and small amount of subarachnoid blood in the perimesencephalic cistern, interfoliar subarachnoid spaces of the posterior fossa and dependent within the occipital horns of the lateral ventricles while on Eliquis and hypertensive to 150/120 with blood glucose 360.    CT Head Code Stroke: parenchymal hemorrhage in the right half of the pons with suggestion of local extension into the prepontine cistern.   CTA head & neck: No LVO or vascular abnormality  MRI  Right pons/cerebellar  IPH with small SAH   2D Echo EF 55-60%, Grade II diastolic dysfunction, No thrombus, wall motion abnormality or shunt found.   VTE prophylaxis - SCDs    Diet   Diet heart healthy/carb modified Room service appropriate? Yes; Fluid consistency: Thin    On Eliquis 2.'5mg'$  BID prior to admission  No anticoagulants or antiplatelets in setting of hemorrhage   Therapy recommendations:  CIR  Disposition:  CIR pending auth/acceptance  Hypertension,   Home meds: Hydralazine '75mg'$  BID  Unstable upon arrival with  emergency: [id number] systolic  Requiring cleviprex infusion, will attempt to wean with restart of home meds.   Hypoxia in the setting of decompensated CHF Was on bipap, now weaned to nasal cannula Holding home diuretic Management per CCM, appreciated  Hyperlipidemia  Home meds:  Zocor '20mg'$    LDL 75, goal < 70  Hold statin in setting of hemorrhage  Consider statin at discharge  AKI   Management per CCM   Diabetes type II Uncontrolled  Insulin drip initially required, no transitioned   Home meds: Novolog SSI with meals, Levemir 80units at hs,   HgbA1c 8.9, goal < 7.0  CBGs Recent Labs    09/26/20 2119 09/27/20 0640 09/27/20 1126  GLUCAP 252* 250* 241*      SSI  Diabetes coordinator following, appreciated. Recommending family be involved in management due to cognitive/memory deficits.   Other Stroke Risk Factors  Advanced Age >/= 26   Associated with increased stroke risk, recommend weight loss, diet and  exercise as appropriate   High risk for Obstructive sleep apnea  Diastolic congestive heart failure   Patient continues to do well and remained stable.  Mobilize out of bed.  Patient has insurance approval for rehab transfer but no bed available today transfer  to inpatient rehab in the next few days.  Discussed with rehab coordinator.  Greater than 50% time during this 25-minute visit was spent in counseling and coordination of care and discussion with care team.    [facility] day # 3 To contact Stroke Continuity provider, please refer to [email or web address] After hours, contact General Neurology

## 2020-09-27 NOTE — Plan of Care (Signed)
  Problem: Health Behavior/Discharge Planning: Goal: Ability to manage health-related needs will improve Outcome: Progressing   Problem: Clinical Measurements: Goal: Ability to maintain clinical measurements within normal limits will improve Outcome: Progressing Goal: Will remain free from infection Outcome: Progressing Goal: Diagnostic test results will improve Outcome: Progressing Goal: Respiratory complications will improve Outcome: Progressing Goal: Cardiovascular complication will be avoided Outcome: Progressing   Problem: Activity: Goal: Risk for activity intolerance will decrease Outcome: Progressing   Problem: Nutrition: Goal: Adequate nutrition will be maintained Outcome: Progressing   Problem: Coping: Goal: Level of anxiety will decrease Outcome: Progressing   Problem: Elimination: Goal: Will not experience complications related to bowel motility Outcome: Progressing Goal: Will not experience complications related to urinary retention Outcome: Progressing   Problem: Pain Managment: Goal: General experience of comfort will improve Outcome: Progressing   Problem: Skin Integrity: Goal: Risk for impaired skin integrity will decrease Outcome: Progressing   Problem: Education: Goal: Knowledge of disease or condition will improve Outcome: Progressing Goal: Knowledge of secondary prevention will improve Outcome: Progressing Goal: Knowledge of patient specific risk factors addressed and post discharge goals established will improve Outcome: Progressing Goal: Individualized Educational Video(s) Outcome: Progressing   Problem: Coping: Goal: Will verbalize positive feelings about self Outcome: Progressing Goal: Will identify appropriate support needs Outcome: Progressing   Problem: Health Behavior/Discharge Planning: Goal: Ability to manage health-related needs will improve Outcome: Progressing   Problem: Self-Care: Goal: Ability to participate in  self-care as condition permits will improve Outcome: Progressing Goal: Ability to communicate needs accurately will improve Outcome: Progressing   Problem: Nutrition: Goal: Risk of aspiration will decrease Outcome: Progressing Goal: Dietary intake will improve Outcome: Progressing   Problem: Intracerebral Hemorrhage Tissue Perfusion: Goal: Complications of Intracerebral Hemorrhage will be minimized Outcome: Progressing

## 2020-09-27 NOTE — Progress Notes (Signed)
PROGRESS NOTE  [name]  [id number] DOB: 1934-12-14 DOA: 09/24/2020 PCP: [name], MD   Brief Narrative: [name] is an 86 y.o. female with a history of chronic AFib on eliquis, CVA with right hemiparesis, HTN, HFpEF, stage III CKD, IDT2DM who presented with a headache due to an acute right pontine hemorrhage in the setting of anticoagulation and hypertension. She was found to have pulmonary edema started on CXR, found also to be hyperglycemic requiring insulin infusion and with AKI improved with IVF. Neurology admitted the patient who was started on clevidipine infusion with improvement in BP. Andexxa was given to reverse eliquis. Ultimately transferred out of ICU with plans to admit to CIR.   Assessment & Plan: Active Problems:   ICH (intracerebral hemorrhage) (HCC)  ICH with small SAH:  - Per neurology, planning dispo to CIR when bed available. No anticoagulation/antiplatelets or statin (home med) for now per neuro.   Uncontrolled IDT2DM with hyperglycemia: HbA1c 9.9%. Initially requiring insulin infusion.  - Increase levemir, continue SSI. We're still no where near her purported home dose, though it seems too high based on our trends. - DM coordinator following, pt to follow up with endocrinology and RD following admission.   HTN: No longer requiring clevidipine gtt. - Hydralazine, imdur home meds restarted.   Paroxysmal atrial fibrillation: Currently in NSR. - Holding eliquis  Acute hypoxic respiratory failure due to acute on chronic HFpEF: LVEF 55-60%, G2DD. - s/p diuresis, holding demadex and IVF for now  AKI on stage IIIb CKD: Improved, may be near baseline with SCr 1.7-1.8.  - Avoid contrast/nephrotoxins.  - Monitor UOP/volume status  Obesity: Estimated body mass index is 30.41 kg/m as calculated from the following:   Height as of this encounter: '5\' 8"'$  (1.727 m).   Weight as of this encounter: 90.7 kg.  DVT prophylaxis: SCDs Code Status: Full Family  Communication: None at bedside Disposition Plan:  Status is: Inpatient  Remains inpatient appropriate because:Unsafe d/c plan   Dispo: The patient is from: Home              Anticipated d/c is to: CIR              Patient currently is medically stable to d/c.   Difficult to place patient No  Consultants:   Neurology, PCCM  Subjective: Nauseated, just had episode of emesis, no HA, no further vomiting, starting back eating breakfast. No new weakness, numbness or other complaints. Eager to work with PT.  Objective: Vitals:   09/27/20 0418 09/27/20 0520 09/27/20 0751 09/27/20 1124  BP: (!) 186/72 (!) 158/80 (!) 190/84 (!) 167/62  Pulse: 83  89 86  Resp: '19  18 18  '$ Temp: 98.4 F (36.9 C)  97.8 F (36.6 C) 98.3 F (36.8 C)  TempSrc: Oral  Oral Oral  SpO2: 97%  100% 99%  Weight:      Height:        Intake/Output Summary (Last 24 hours) at 09/27/2020 1452 Last data filed at 09/27/2020 1300 Gross per 24 hour  Intake 420 ml  Output 1200 ml  Net -780 ml   Filed Weights   09/24/20 1729  Weight: 90.7 kg    Gen: Chronically ill-appearing female in no distress  Pulm: Non-labored breathing. Clear to auscultation bilaterally.  CV: Regular rate and rhythm. No murmur, rub, or gallop. No JVD, trace pedal edema. GI: Abdomen soft, non-tender, non-distended, with normoactive bowel sounds. No organomegaly or masses felt. Ext: Warm, no deformities Skin:  No rashes, lesions or ulcers on visualized skin Neuro: Alert and oriented. No focal neurological deficits. Psych: Judgement and insight appear normal. Mood & affect appropriate.   Data Reviewed: I have personally reviewed following labs and imaging studies  CBC: Recent Labs  Lab 09/24/20 2020 09/24/20 2137 09/26/20 0323 09/27/20 0127  WBC  --  10.4 8.3 8.0  NEUTROABS  --  7.9*  --   --   HGB 7.1* 12.7 10.6* 10.1*  HCT 21.0* 40.8 32.8* 31.5*  MCV  --  92.7 89.1 89.0  PLT  --  253 213 [id number]   Basic Metabolic Panel: Recent  Labs  Lab 09/24/20 2020 09/24/20 2137 09/25/20 0440 09/25/20 0811 09/26/20 0323 09/27/20 0127  NA 145 138 137  --  139 138  K 2.3* 3.9 4.1  --  3.9 3.9  CL 122* 103 101  --  107 107  CO2  --  25 25  --  24 25  GLUCOSE 217* 345* 424*  --  147* 220*  BUN 18 28* 27*  --  29* 25*  CREATININE 0.70 1.58* 1.93*  --  2.16* 1.78*  CALCIUM  --  8.8* 8.5*  --  8.3* 8.6*  MG  --   --   --  2.3  --   --   PHOS  --   --   --   --  3.5  --    GFR: Estimated Creatinine Clearance: 27.7 mL/min (A) (by C-G formula based on SCr of 1.78 mg/dL (H)). Liver Function Tests: Recent Labs  Lab 09/26/20 0323  ALBUMIN 2.8*   No results for input(s): LIPASE, AMYLASE in the last 168 hours. No results for input(s): AMMONIA in the last 168 hours. Coagulation Profile: Recent Labs  Lab 09/24/20 2137  INR 1.1   Cardiac Enzymes: No results for input(s): CKTOTAL, CKMB, CKMBINDEX, TROPONINI in the last 168 hours. BNP (last 3 results) No results for input(s): PROBNP in the last 8760 hours. HbA1C: Recent Labs    09/26/20 0323  HGBA1C 9.9*   CBG: Recent Labs  Lab 09/26/20 1656 09/26/20 1744 09/26/20 2119 09/27/20 0640 09/27/20 1126  GLUCAP 302* 300* 252* 250* 241*   Lipid Profile: Recent Labs    09/26/20 0323  CHOL 129  HDL 29*  LDLCALC 73  TRIG 133  CHOLHDL 4.4   Thyroid Function Tests: No results for input(s): TSH, T4TOTAL, FREET4, T3FREE, THYROIDAB in the last 72 hours. Anemia Panel: No results for input(s): VITAMINB12, FOLATE, FERRITIN, TIBC, IRON, RETICCTPCT in the last 72 hours. Urine analysis:    Component Value Date/Time   COLORURINE YELLOW 09/25/2020 0806   APPEARANCEUR HAZY (A) 09/25/2020 0806   LABSPEC 1.024 09/25/2020 0806   PHURINE 5.0 09/25/2020 0806   GLUCOSEU >=500 (A) 09/25/2020 0806   HGBUR NEGATIVE 09/25/2020 0806   BILIRUBINUR NEGATIVE 09/25/2020 0806   KETONESUR NEGATIVE 09/25/2020 0806   PROTEINUR 100 (A) 09/25/2020 0806   UROBILINOGEN 0.2 02/20/2015 1200    NITRITE NEGATIVE 09/25/2020 0806   LEUKOCYTESUR NEGATIVE 09/25/2020 0806   Recent Results (from the past 240 hour(s))  Resp Panel by RT-PCR (Flu A&B, Covid) Nasopharyngeal Swab     Status: None   Collection Time: 09/24/20  8:02 PM   Specimen: Nasopharyngeal Swab; Nasopharyngeal(NP) swabs in vial transport medium  Result Value Ref Range Status   SARS Coronavirus 2 by RT PCR NEGATIVE NEGATIVE Final    Comment: (NOTE) SARS-CoV-2 target nucleic acids are NOT DETECTED.  The SARS-CoV-2 RNA is generally detectable  in upper respiratory specimens during the acute phase of infection. The lowest concentration of SARS-CoV-2 viral copies this assay can detect is 138 copies/mL. A negative result does not preclude SARS-Cov-2 infection and should not be used as the sole basis for treatment or other patient management decisions. A negative result may occur with  improper specimen collection/handling, submission of specimen other than nasopharyngeal swab, presence of viral mutation(s) within the areas targeted by this assay, and inadequate number of viral copies(<138 copies/mL). A negative result must be combined with clinical observations, patient history, and epidemiological information. The expected result is Negative.  Fact Sheet for Patients:  EntrepreneurPulse.com.au  Fact Sheet for Healthcare Providers:  IncredibleEmployment.be  This test is no t yet approved or cleared by the [place] FDA and  has been authorized for detection and/or diagnosis of SARS-CoV-2 by FDA under an Emergency Use Authorization (EUA). This EUA will remain  in effect (meaning this test can be used) for the duration of the COVID-19 declaration under Section 564(b)(1) of the Act, 21 U.S.C.section 360bbb-3(b)(1), unless the authorization is terminated  or revoked sooner.       Influenza A by PCR NEGATIVE NEGATIVE Final   Influenza B by PCR NEGATIVE NEGATIVE Final     Comment: (NOTE) The Xpert Xpress SARS-CoV-2/FLU/RSV plus assay is intended as an aid in the diagnosis of influenza from Nasopharyngeal swab specimens and should not be used as a sole basis for treatment. Nasal washings and aspirates are unacceptable for Xpert Xpress SARS-CoV-2/FLU/RSV testing.  Fact Sheet for Patients: EntrepreneurPulse.com.au  Fact Sheet for Healthcare Providers: IncredibleEmployment.be  This test is not yet approved or cleared by the [place] FDA and has been authorized for detection and/or diagnosis of SARS-CoV-2 by FDA under an Emergency Use Authorization (EUA). This EUA will remain in effect (meaning this test can be used) for the duration of the COVID-19 declaration under Section 564(b)(1) of the Act, 21 U.S.C. section 360bbb-3(b)(1), unless the authorization is terminated or revoked.  Performed at [facility], [address], [place], [place] [address]   MRSA PCR Screening     Status: None   Collection Time: 09/25/20 12:38 AM   Specimen: Nasal Mucosa; Nasopharyngeal  Result Value Ref Range Status   MRSA by PCR NEGATIVE NEGATIVE Final    Comment:        The GeneXpert MRSA Assay (FDA approved for NASAL specimens only), is one component of a comprehensive MRSA colonization surveillance program. It is not intended to diagnose MRSA infection nor to guide or monitor treatment for MRSA infections. Performed at [facility], [place] [address] [place], [place] [address]       Radiology Studies: ECHOCARDIOGRAM COMPLETE  Result Date: 09/25/2020    ECHOCARDIOGRAM REPORT   Patient Name:   IRIDIANA PILSON Date of Exam: 09/25/2020 Medical Rec #:  [id number]    Height:       68.0 in Accession #:    [id number]   Weight:       200.0 lb Date of Birth:  September 09, 1934     BSA:          2.044 m Patient Age:    22 years     BP:           118/43 mmHg Patient Gender: F            HR:           76 bpm. Exam Location:  Inpatient Procedure:  2D Echo Indications:  stroke  History:        Patient has prior history of Echocardiogram examinations, most                 recent 06/29/2019. CHF, Arrythmias:Atrial Fibrillation; Risk                 Factors:Diabetes and Hypertension.  Sonographer:    Johny Chess Referring Phys: [phone] [place]  Sonographer Comments: Image acquisition challenging due to patient body habitus. IMPRESSIONS  1. Left ventricular ejection fraction, by estimation, is 55 to 60%. The left ventricle has normal function. The left ventricle has no regional wall motion abnormalities. Left ventricular diastolic parameters are consistent with Grade II diastolic dysfunction (pseudonormalization). Elevated left atrial pressure.  2. Right ventricular systolic function is normal. The right ventricular size is normal.  3. The mitral valve is normal in structure. Trivial mitral valve regurgitation. No evidence of mitral stenosis.  4. The aortic valve has an indeterminant number of cusps. Aortic valve regurgitation is not visualized. No aortic stenosis is present.  5. The inferior vena cava is normal in size with greater than 50% respiratory variability, suggesting right atrial pressure of 3 mmHg. FINDINGS  Left Ventricle: Left ventricular ejection fraction, by estimation, is 55 to 60%. The left ventricle has normal function. The left ventricle has no regional wall motion abnormalities. The left ventricular internal cavity size was normal in size. There is  no left ventricular hypertrophy. Left ventricular diastolic parameters are consistent with Grade II diastolic dysfunction (pseudonormalization). Elevated left atrial pressure. Right Ventricle: The right ventricular size is normal.Right ventricular systolic function is normal. Left Atrium: Left atrial size was normal in size. Right Atrium: Right atrial size was normal in size. Pericardium: There is no evidence of pericardial effusion. Mitral Valve: The mitral valve is normal in  structure. Mild mitral annular calcification. Trivial mitral valve regurgitation. No evidence of mitral valve stenosis. Tricuspid Valve: The tricuspid valve is normal in structure. Tricuspid valve regurgitation is trivial. No evidence of tricuspid stenosis. Aortic Valve: The aortic valve has an indeterminant number of cusps. Aortic valve regurgitation is not visualized. No aortic stenosis is present. Pulmonic Valve: The pulmonic valve was not well visualized. Pulmonic valve regurgitation is not visualized. No evidence of pulmonic stenosis. Aorta: The aortic root is normal in size and structure. Venous: The inferior vena cava is normal in size with greater than 50% respiratory variability, suggesting right atrial pressure of 3 mmHg. IAS/Shunts: No atrial level shunt detected by color flow Doppler.  LEFT VENTRICLE PLAX 2D LVIDd:         4.70 cm  Diastology LVIDs:         3.40 cm  LV e' medial:    4.57 cm/s LV PW:         0.90 cm  LV E/e' medial:  33.3 LV IVS:        1.10 cm  LV e' lateral:   7.18 cm/s LVOT diam:     2.10 cm  LV E/e' lateral: 21.2 LV SV:         73 LV SV Index:   36 LVOT Area:     3.46 cm  RIGHT VENTRICLE             IVC RV S prime:     13.60 cm/s  IVC diam: 2.20 cm TAPSE (M-mode): 1.5 cm LEFT ATRIUM             Index LA diam:  3.90 cm 1.91 cm/m LA Vol (A2C):   51.9 ml 25.39 ml/m LA Vol (A4C):   63.4 ml 31.02 ml/m LA Biplane Vol: 61.0 ml 29.85 ml/m  AORTIC VALVE LVOT Vmax:   106.00 cm/s LVOT Vmean:  72.100 cm/s LVOT VTI:    0.211 m  AORTA Ao Root diam: 2.90 cm MITRAL VALVE MV Area (PHT): 3.72 cm     SHUNTS MV Decel Time: 204 msec     Systemic VTI:  0.21 m MV E velocity: 152.00 cm/s  Systemic Diam: 2.10 cm MV A velocity: 93.00 cm/s MV E/A ratio:  1.63 Kirk Ruths MD Electronically signed by Kirk Ruths MD Signature Date/Time: 09/25/2020/4:45:44 PM    Final     Scheduled Meds: . Chlorhexidine Gluconate Cloth  6 each Topical Daily  . hydrALAZINE  75 mg Oral Q12H  . insulin aspart   0-20 Units Subcutaneous TID WC  . insulin aspart  0-5 Units Subcutaneous QHS  . insulin detemir  15 Units Subcutaneous BID  . isosorbide mononitrate  60 mg Oral Daily  . pantoprazole  40 mg Oral Daily  . senna-docusate  1 tablet Oral BID   Continuous Infusions:   LOS: 3 days   Time spent: 25 minutes.  Patrecia Pour, MD [facility] [email or web address] 09/27/2020, 2:52 PM

## 2020-09-27 NOTE — Care Management Important Message (Signed)
Important Message  Patient Details  Name: [name] MRN: [id number] Date of Birth: [birth year]   Medicare Important Message Given:  Yes     Shantice Menger 09/27/2020, 2:06 PM

## 2020-09-27 NOTE — Progress Notes (Signed)
Inpatient Rehab Admissions Coordinator:   I did receive insurance authorization for pt to admit to CIR however I do not have a bed available for her to admit today.  I will continue to follow for timing of admission pending bed availability.   Shann Medal, PT, DPT Admissions Coordinator [phone] 09/27/20  12:46 PM

## 2020-09-27 NOTE — Progress Notes (Signed)
Physical Therapy Treatment Patient Details Name: [name] MRN: [id number] DOB: 1935-03-31 Today's Date: 09/27/2020    History of Present Illness Pt is a 86 y.o. F admitted with acute onset headache. 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 While in ED, she had some SOB and pulmonary edema on chest x-ray and was started on BiPAP. Significant PMH: CHF, CKD, HTN, DM2, prior stroke with right hemiplegia.    PT Comments    Upon arrival, pt reported she had recently had a bout of emesis. Plan was to attempt transfers with use of stedy this date. When pt was transitioned to sit EOB with maxAx2 she became lightheaded and nauseated, attempting but unsuccessful with another bout of emesis. BP was 205/83, thus returned pt to supine with it eventually improving to 179/74 with resolution of her symptoms. RN was notified. Ended session due to unsafe and symptomatic HTN. Will continue to follow acutely. Current recommendations remain appropriate.    Follow Up Recommendations  [facility]IR     Equipment Recommendations  Wheelchair (measurements PT);Wheelchair cushion (measurements PT)    Recommendations for Other Services       Precautions / Restrictions Precautions Precautions: Fall Precaution Comments: Goal SBP < 140 Restrictions Weight Bearing Restrictions: No    Mobility  Bed Mobility Overal bed mobility: Needs Assistance Bed Mobility: Supine to Sit;Sit to Supine     Supine to sit: Max assist;+2 for physical assistance Sit to supine: Max assist;+2 for physical assistance   General bed mobility comments: Pt initiating movement of bil legs to EOB, but requires maxA to complete. Requiring assist for rolling on side for trunk elevation. Pt requiring maxA +2 for all movements overall and simple commands for follow through.    Transfers                 General transfer comment: Plan was to use stedy, but unable to attempt due to symptoms of  lightheadedness and nausea with coming to sit with pt having HTN of 205/83, thus returned pt to supine  Ambulation/Gait             General Gait Details: Unable   Stairs             Wheelchair Mobility    Modified Rankin (Stroke Patients Only) Modified Rankin (Stroke Patients Only) Pre-Morbid Rankin Score: Moderate disability Modified Rankin: Severe disability     Balance Overall balance assessment: Needs assistance Sitting-balance support: Feet supported Sitting balance-Leahy Scale: Poor Sitting balance - Comments: Posterior assistance of up to modA for static sitting EOB, majority of time only needing minA. Postural control: Posterior lean     Standing balance comment: Deferred this session for safety                            Cognition Arousal/Alertness: Awake/alert Behavior During Therapy: WFL for tasks assessed/performed Overall Cognitive Status: Impaired/Different from baseline Area of Impairment: Safety/judgement;Awareness                         Safety/Judgement: Decreased awareness of safety;Decreased awareness of deficits Awareness: Emergent   General Comments: Pt needing cues for sequencing of all movements, decreased initiaion.      Exercises      General Comments General comments (skin integrity, edema, etc.): When coming to sit pt became nauseated, unsuccessful with attempted bout of emesis, and became lightheaded, BP 205/83, thus returned pt to  supine with BP 205/72, after resting in supine BP 179/74      Pertinent Vitals/Pain Pain Assessment: Faces Faces Pain Scale: Hurts even more Pain Location: generalized with movement Pain Descriptors / Indicators: Discomfort;Grimacing Pain Intervention(s): Limited activity within patient's tolerance;Monitored during session;Repositioned    Home Living                      Prior Function            PT Goals (current goals can now be found in the care plan  section) Acute Rehab PT Goals Patient Stated Goal: to feel better PT Goal Formulation: With patient/family Time For Goal Achievement: 10/09/20 Potential to Achieve Goals: Good Progress towards PT goals: Not progressing toward goals - comment (limited by HTN and symptoms of lightheadedness and nausea this date)    Frequency    Min 4X/week      PT Plan Current plan remains appropriate    Co-evaluation              AM-PAC PT "6 Clicks" Mobility   Outcome Measure  Help needed turning from your back to your side while in a flat bed without using bedrails?: A Lot Help needed moving from lying on your back to sitting on the side of a flat bed without using bedrails?: Total Help needed moving to and from a bed to a chair (including a wheelchair)?: Total Help needed standing up from a chair using your arms (e.g., wheelchair or bedside chair)?: Total Help needed to walk in hospital room?: Total Help needed climbing 3-5 steps with a railing? : Total 6 Click Score: 7    End of Session Equipment Utilized During Treatment: Gait belt Activity Tolerance: Patient limited by fatigue;Treatment limited secondary to medical complications (Comment) (HTN) Patient left: in bed;with call bell/phone within reach;with bed alarm set Nurse Communication: Mobility status;Other (comment) (symptoms and BP changes) PT Visit Diagnosis: Other abnormalities of gait and mobility (R26.89);Muscle weakness (generalized) (M62.81);Difficulty in walking, not elsewhere classified (R26.2)     Time: [id number] PT Time Calculation (min) (ACUTE ONLY): 24 min  Charges:  $Therapeutic Activity: 23-37 mins                     Moishe Spice, PT, DPT Acute Rehabilitation Services  Pager: [phone] Office: [place] 09/27/2020, 11:16 AM

## 2020-09-28 LAB — GLUCOSE, CAPILLARY
Glucose-Capillary: 314 mg/dL — ABNORMAL HIGH (ref 70–99)
Glucose-Capillary: 275 mg/dL — ABNORMAL HIGH (ref 70–99)
Glucose-Capillary: 218 mg/dL — ABNORMAL HIGH (ref 70–99)
Glucose-Capillary: 250 mg/dL — ABNORMAL HIGH (ref 70–99)

## 2020-09-28 MED ORDER — HYDRALAZINE HCL 50 MG PO TABS
100.0000 mg | ORAL_TABLET | Freq: Three times a day (TID) | ORAL | Status: DC
  Administered 2020-09-28 – 2020-09-30 (×6): 100 mg via ORAL
  Filled 2020-09-28 (×6): qty 2

## 2020-09-28 MED ORDER — INSULIN DETEMIR 100 UNIT/ML ~~LOC~~ SOLN: 80.0000 [IU] | Freq: Every day | SUBCUTANEOUS | 2 refills | Status: DC

## 2020-09-28 MED ORDER — AMLODIPINE BESYLATE 10 MG PO TABS
10.0000 mg | ORAL_TABLET | Freq: Every day | ORAL | Status: DC
  Administered 2020-09-29 – 2020-09-30 (×2): 10 mg via ORAL
  Filled 2020-09-28 (×2): qty 1

## 2020-09-28 MED ORDER — INSULIN DETEMIR 100 UNIT/ML ~~LOC~~ SOLN
25.0000 [IU] | Freq: Two times a day (BID) | SUBCUTANEOUS | Status: DC
  Administered 2020-09-28 (×2): 25 [IU] via SUBCUTANEOUS
  Filled 2020-09-28 (×5): qty 0.25

## 2020-09-28 MED ORDER — AMLODIPINE BESYLATE 10 MG PO TABS: 10.0000 mg | ORAL_TABLET | Freq: Every day | ORAL | Status: AC

## 2020-09-28 MED ORDER — AMLODIPINE BESYLATE 10 MG PO TABS
10.0000 mg | ORAL_TABLET | ORAL | Status: AC
  Administered 2020-09-28: 10 mg via ORAL
  Filled 2020-09-28: qty 1

## 2020-09-28 NOTE — Discharge Summary (Addendum)
Physician Discharge Summary  [name] [id number] DOB: 1934/07/28 DOA: 09/24/2020  PCP: [name], MD  Admit date: 09/24/2020 Discharge date: 09/28/2020  Admitted From: Home Disposition: CIR   Recommendations for Outpatient Follow-up:  1. Follow up with neurology post discharge 2. Continue titration of insulin and diabetes education, follow up with endocrinology recommended 3. Continue monitoring and management of HTN 4. Monitor volume status. Continue home medications at discharge.  Home Health: Per CIR Equipment/Devices: Per CIR Discharge Condition: Stable CODE STATUS: Full Diet recommendation: Heart healthy, carb-modified  Brief/Interim Summary: [name] is an 86 y.o. female with a history of chronic AFib on eliquis, CVA with right hemiparesis, HTN, HFpEF, stage III CKD, IDT2DM who presented with a headache due to an acute right pontine hemorrhage in the setting of anticoagulation and hypertension. She was found to have pulmonary edema started on CXR, found also to be hyperglycemic requiring insulin infusion and with AKI improved with IVF. Neurology admitted the patient who was started on clevidipine infusion with improvement in BP. Andexxa was given to reverse eliquis. Ultimately transferred out of ICU with plans to admit to CIR.   Discharge Diagnoses:  Active Problems:   ICH (intracerebral hemorrhage) (HCC)  ICH with small SAH:  - Per neurology, planning dispo to CIR when bed available. No anticoagulation/antiplatelets or statin (home med) for now per neuro.  - Needs neurology follow up post discharge  Uncontrolled IDT2DM with hyperglycemia: HbA1c 9.9%. Initially requiring insulin infusion.  - Increased levemir again 5/14 to 25 units BID, continue SSI. We're still no where near her purported home dose, though it seems too high based on our trends. - DM coordinator following, pt to follow up with endocrinology and RD following admission.   HTN: No longer  requiring clevidipine gtt. - Continue home hydralazine, imdur. Continue norvasc '10mg'$  as started here.  Paroxysmal atrial fibrillation: Currently in NSR. - Holding eliquis  Acute hypoxic respiratory failure due to acute on chronic HFpEF: LVEF 55-60%, G2DD. - s/p diuresis, holding demadex and IVF for now  AKI on stage IIIb CKD: Improved, may be near baseline with SCr 1.7-1.8.  - Avoid contrast/nephrotoxins.  - Monitor UOP/volume status  Obesity: Estimated body mass index is 30.41 kg/m   Discharge Instructions  Allergies as of 09/28/2020   No Known Allergies     Medication List    STOP taking these medications   apixaban 2.5 MG Tabs tablet Commonly known as: ELIQUIS   ascorbic acid 500 MG tablet Commonly known as: VITAMIN C   dexamethasone 6 MG tablet Commonly known as: DECADRON   famotidine 20 MG tablet Commonly known as: PEPCID   guaiFENesin-dextromethorphan 100-10 MG/5ML syrup Commonly known as: ROBITUSSIN DM   potassium chloride SA 20 MEQ tablet Commonly known as: KLOR-CON   simvastatin 20 MG tablet Commonly known as: ZOCOR   zinc sulfate 220 (50 Zn) MG capsule     TAKE these medications   acetaminophen 325 MG tablet Commonly known as: TYLENOL Take 2 tablets (650 mg total) by mouth every 6 (six) hours as needed for mild pain, fever or headache (or Fever >/= 101).   albuterol 108 (90 Base) MCG/ACT inhaler Commonly known as: VENTOLIN HFA Inhale 2 puffs into the lungs every 6 (six) hours as needed for wheezing or shortness of breath.   amLODipine 10 MG tablet Commonly known as: NORVASC Take 1 tablet (10 mg total) by mouth daily. Start taking on: Sep 29, 2020   gabapentin 300 MG capsule Commonly known as:  NEURONTIN Take 300 mg by mouth 2 (two) times daily.   hydrALAZINE 50 MG tablet Commonly known as: APRESOLINE Take 1.5 tablets (75 mg total) by mouth 2 (two) times daily. What changed: how much to take   hydrocortisone 2.5 % rectal  cream Commonly known as: ANUSOL-HC Place 1 application rectally 2 (two) times daily as needed for hemorrhoids or anal itching.   insulin aspart 100 UNIT/ML FlexPen Commonly known as: NOVOLOG Inject 0-10 Units into the skin 3 (three) times daily with meals. insulin aspart (novoLOG) injection 0-10 Units 0-10 Units Subcutaneous, 3 times daily with meals CBG < 70: Implement Hypoglycemia Standing Orders and refer to Hypoglycemia Standing Orders sidebar report  CBG 70 - 120: 0 unit CBG 121 - 150: 0 unit  CBG 151 - 200: 1 unit CBG 201 - 250: 2 units CBG 251 - 300: 4 units CBG 301 - 350: 6 units  CBG 351 - 400: 8 units  CBG > 400: 10 units   insulin detemir 100 UNIT/ML injection Commonly known as: LEVEMIR Inject 0.8 mLs (80 Units total) into the skin at bedtime. recommend lower dose (25u BID at this time with plans to increase as needed at CIR) What changed: additional instructions   isosorbide mononitrate 60 MG 24 hr tablet Commonly known as: IMDUR Take 1 tablet (60 mg total) by mouth daily.   ondansetron 4 MG tablet Commonly known as: ZOFRAN Take 4 mg by mouth every 6 (six) hours as needed for nausea or vomiting.   pantoprazole 40 MG tablet Commonly known as: PROTONIX Take 1 tablet by mouth daily.   torsemide 20 MG tablet Commonly known as: DEMADEX Take 1 tablet (20 mg total) by mouth daily.       No Known Allergies  Consultations:  PCCM  Neurology  Procedures/Studies: CT Angio Head W or Wo Contrast  Result Date: 09/24/2020 CLINICAL DATA:  Initial evaluation for acute intracranial hemorrhage. EXAM: CT ANGIOGRAPHY HEAD AND NECK TECHNIQUE: Multidetector CT imaging of the head and neck was performed using the standard protocol during bolus administration of intravenous contrast. Multiplanar CT image reconstructions and MIPs were obtained to evaluate the vascular anatomy. Carotid stenosis measurements (when applicable) are obtained utilizing NASCET criteria, using the distal internal  carotid diameter as the denominator. CONTRAST:  90m OMNIPAQUE IOHEXOL 350 MG/ML SOLN COMPARISON:  Prior head CT from earlier the same day. FINDINGS: CTA NECK FINDINGS Aortic arch: Visualized aortic arch normal in caliber. Origin of the great vessels incompletely assessed on this exam. Atheromatous change about the origin of the left subclavian artery without significant stenosis. Right carotid system: Right CCA patent from its origin to the bifurcation without stenosis. Scattered calcified plaque about the right carotid bulb without significant stenosis. Right ICA patent distally without stenosis, dissection or occlusion. Left carotid system: Origin of the left CCA not visualized. Visualized left CCA patent to the bifurcation without stenosis. Eccentric calcified plaque at the left carotid bulb without significant stenosis. Left ICA patent distally without stenosis, dissection or occlusion. Vertebral arteries: Both vertebral arteries arise from subclavian arteries. Right vertebral artery dominant. Vertebral arteries patent within the neck without stenosis, dissection or occlusion. Skeleton: No visible acute osseous abnormality. No discrete or worrisome osseous lesions. 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 Other neck: No other visible acute soft tissue abnormality within the neck. Enlarged multinodular goiter, with dominant 2.6 cm right thyroid nodule. This has been evaluated on previous imaging in 2013. (ref: J Am Coll Radiol. 2015 Feb;12(2): 143-50).  No other mass or adenopathy. Upper chest: Diffuse interlobular septal thickening seen within the visualized lungs, suggesting pulmonary interstitial edema. Visualized upper chest demonstrates no other acute finding. Review of the MIP images confirms the above findings CTA HEAD FINDINGS Anterior circulation: Petrous segments patent bilaterally. Extensive atheromatous change throughout the carotid siphons with associated moderate to advanced  diffuse narrowing. A1 segments patent bilaterally. Normal anterior communicating artery complex. Anterior cerebral arteries patent to their distal aspects without stenosis. No M1 stenosis or occlusion. Normal MCA bifurcations. Distal MCA branches well perfused and symmetric. Posterior circulation: Atheromatous change within the mid V4 segments bilaterally with associated moderate stenoses. Left PICA origin patent and normal. Right PICA not seen. Basilar patent to its distal aspect without stenosis. Superior cerebellar arteries patent bilaterally. Left PCA supplied via the basilar. Predominant fetal type origin of the right PCA. PCAs patent to their distal aspects without stenosis. Venous sinuses: Patent allowing for timing the contrast bolus. Anatomic variants: Predominant fetal type origin of the right PCA. No intracranial aneurysm. No vascular abnormality seen underlying the hemorrhages at the pons and cerebellum. Review of the MIP images confirms the above findings IMPRESSION: 1. Negative CTA for large vessel occlusion. No vascular abnormality seen underlying the hemorrhages at the pons and cerebellum. 2. Extensive atheromatous change throughout the carotid siphons with associated moderate to advanced diffuse narrowing. 3. Atheromatous change about the V4 segments bilaterally with associated moderate stenoses. 4. Diffuse interlobular septal thickening within the visualized lungs, suggesting pulmonary interstitial edema. Electronically Signed   By: Jeannine Boga M.D.   On: 09/24/2020 21:57   CT Head Wo Contrast  Addendum Date: 09/24/2020   ADDENDUM REPORT: 09/24/2020 20:39 ADDENDUM: The patient was subsequently sedated and return for repeat imaging which demonstrates evidence of a parenchymal hemorrhage in the right half of the pons with suggestion of local extension into the prepontine cistern. The density seen in the midline posteriorly may represent some dependent spread of hemorrhage although some  artifact does remain. Critical Value/emergent results were called by telephone at the time of interpretation on 09/24/2020 at 8:39 pm to Dr. Varney Biles , who verbally acknowledged these results. Electronically Signed   By: Inez Catalina M.D.   On: 09/24/2020 20:39   Result Date: 09/24/2020 CLINICAL DATA:  Headaches, no known injury, initial encounter EXAM: CT HEAD WITHOUT CONTRAST TECHNIQUE: Contiguous axial images were obtained from the base of the skull through the vertex without intravenous contrast. COMPARISON:  05/02/2008 FINDINGS: Brain: Images are significantly limited by patient motion artifact. Mild atrophic changes and chronic white matter ischemic changes are seen. There are areas of increased attenuation anterior to the pons as well as posteriorly in the midline in the cerebellum. These are likely related to artifact although the possibility of underlying hemorrhage could not be totally excluded on this exam. Repeat imaging when the patient can better tolerate the exam is recommended. Vascular: No hyperdense vessel or unexpected calcification. Skull: Normal. Negative for fracture or focal lesion. Sinuses/Orbits: No acute finding. Other: None. IMPRESSION: Significantly limited exam. There are findings suspicious for hemorrhage in the posterior fossa and given the clinical history repeat imaging with sedation is recommended to allow the patient to better tolerate the procedure. No other focal abnormality is noted. Electronically Signed: By: Inez Catalina M.D. On: 09/24/2020 19:55   CT Angio Neck W and/or Wo Contrast  Result Date: 09/24/2020 CLINICAL DATA:  Initial evaluation for acute intracranial hemorrhage. EXAM: CT ANGIOGRAPHY HEAD AND NECK TECHNIQUE: Multidetector CT imaging of the  head and neck was performed using the standard protocol during bolus administration of intravenous contrast. Multiplanar CT image reconstructions and MIPs were obtained to evaluate the vascular anatomy. Carotid  stenosis measurements (when applicable) are obtained utilizing NASCET criteria, using the distal internal carotid diameter as the denominator. CONTRAST:  54m OMNIPAQUE IOHEXOL 350 MG/ML SOLN COMPARISON:  Prior head CT from earlier the same day. FINDINGS: CTA NECK FINDINGS Aortic arch: Visualized aortic arch normal in caliber. Origin of the great vessels incompletely assessed on this exam. Atheromatous change about the origin of the left subclavian artery without significant stenosis. Right carotid system: Right CCA patent from its origin to the bifurcation without stenosis. Scattered calcified plaque about the right carotid bulb without significant stenosis. Right ICA patent distally without stenosis, dissection or occlusion. Left carotid system: Origin of the left CCA not visualized. Visualized left CCA patent to the bifurcation without stenosis. Eccentric calcified plaque at the left carotid bulb without significant stenosis. Left ICA patent distally without stenosis, dissection or occlusion. Vertebral arteries: Both vertebral arteries arise from subclavian arteries. Right vertebral artery dominant. Vertebral arteries patent within the neck without stenosis, dissection or occlusion. Skeleton: No visible acute osseous abnormality. No discrete or worrisome osseous lesions. 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 Other neck: No other visible acute soft tissue abnormality within the neck. Enlarged multinodular goiter, with dominant 2.6 cm right thyroid nodule. This has been evaluated on previous imaging in 2013. (ref: J Am Coll Radiol. 2015 Feb;12(2): 143-50). No other mass or adenopathy. Upper chest: Diffuse interlobular septal thickening seen within the visualized lungs, suggesting pulmonary interstitial edema. Visualized upper chest demonstrates no other acute finding. Review of the MIP images confirms the above findings CTA HEAD FINDINGS Anterior circulation: Petrous segments patent  bilaterally. Extensive atheromatous change throughout the carotid siphons with associated moderate to advanced diffuse narrowing. A1 segments patent bilaterally. Normal anterior communicating artery complex. Anterior cerebral arteries patent to their distal aspects without stenosis. No M1 stenosis or occlusion. Normal MCA bifurcations. Distal MCA branches well perfused and symmetric. Posterior circulation: Atheromatous change within the mid V4 segments bilaterally with associated moderate stenoses. Left PICA origin patent and normal. Right PICA not seen. Basilar patent to its distal aspect without stenosis. Superior cerebellar arteries patent bilaterally. Left PCA supplied via the basilar. Predominant fetal type origin of the right PCA. PCAs patent to their distal aspects without stenosis. Venous sinuses: Patent allowing for timing the contrast bolus. Anatomic variants: Predominant fetal type origin of the right PCA. No intracranial aneurysm. No vascular abnormality seen underlying the hemorrhages at the pons and cerebellum. Review of the MIP images confirms the above findings IMPRESSION: 1. Negative CTA for large vessel occlusion. No vascular abnormality seen underlying the hemorrhages at the pons and cerebellum. 2. Extensive atheromatous change throughout the carotid siphons with associated moderate to advanced diffuse narrowing. 3. Atheromatous change about the V4 segments bilaterally with associated moderate stenoses. 4. Diffuse interlobular septal thickening within the visualized lungs, suggesting pulmonary interstitial edema. Electronically Signed   By: BJeannine BogaM.D.   On: 09/24/2020 21:57   MR BRAIN WO CONTRAST  Result Date: 09/25/2020 CLINICAL DATA:  Previous stroke. Chronic atrial fibrillation. Right hemiparesis. New onset headache. Intracranial hemorrhage. EXAM: MRI HEAD WITHOUT CONTRAST TECHNIQUE: Multiplanar, multiecho pulse sequences of the brain and surrounding structures were obtained  without intravenous contrast. COMPARISON:  CT studies yesterday. FINDINGS: Brain: Acute intraparenchymal hemorrhage within the right side of the pons is redemonstrated without appreciable change. Acute  hemorrhage in the inferior cerebellar vermis is redemonstrated without appreciable change. Chronic small-vessel ischemic changes are present elsewhere throughout the pons. Few other old small vessel cerebellar infarctions. Cerebral hemispheres show chronic small-vessel disease throughout the thalami and hemispheric white matter. Few old basal ganglia infarctions. No large vessel territory infarction. Numerous scattered punctate foci of hemosiderin deposition related to some of the old small vessel infarctions. Small amount of blood dependent within the occipital horns of the lateral ventricles and in the perimesencephalic cistern. No hydrocephalus. No sign of mass lesion. Vascular: Major vessels at the base of the brain show flow. Skull and upper cervical spine: Negative Sinuses/Orbits: Clear/normal Other: None IMPRESSION: Redemonstration of acute intraparenchymal hemorrhage within the right side of the pons and within the inferior cerebellar vermis. Small amount of subarachnoid blood in the perimesencephalic cistern, interfoliar subarachnoid spaces of the posterior fossa and dependent within the occipital horns of the lateral ventricles. No evidence of increasing or new hemorrhage. No hydrocephalus. Extensive chronic small-vessel ischemic changes elsewhere throughout the brain, many with punctate hemosiderin deposition. Overall findings most consistent with hypertensive disease. Electronically Signed   By: Nelson Chimes M.D.   On: 09/25/2020 07:33   DG Chest Port 1 View  Result Date: 09/24/2020 CLINICAL DATA:  Shortness of breath cough EXAM: PORTABLE CHEST 1 VIEW COMPARISON:  02/10/2020 FINDINGS: Cardiac shadow remains enlarged. Mild vascular congestion is noted although improved when compared with the prior exam.  No sizable effusion is noted. No pneumothorax is seen. No bony abnormality is noted. IMPRESSION: Mild CHF although improved from the prior study. Electronically Signed   By: Inez Catalina M.D.   On: 09/24/2020 18:49   ECHOCARDIOGRAM COMPLETE  Result Date: 09/25/2020    ECHOCARDIOGRAM REPORT   Patient Name:   JACARRA ATTRIDGE Date of Exam: 09/25/2020 Medical Rec #:  [id number]    Height:       68.0 in Accession #:    [id number]   Weight:       200.0 lb Date of Birth:  27-Jan-1935     BSA:          2.044 m Patient Age:    86 years     BP:           118/43 mmHg Patient Gender: F            HR:           76 bpm. Exam Location:  Inpatient Procedure: 2D Echo Indications:    stroke  History:        Patient has prior history of Echocardiogram examinations, most                 recent 06/29/2019. CHF, Arrythmias:Atrial Fibrillation; Risk                 Factors:Diabetes and Hypertension.  Sonographer:    Johny Chess Referring Phys: [phone] [place]  Sonographer Comments: Image acquisition challenging due to patient body habitus. IMPRESSIONS  1. Left ventricular ejection fraction, by estimation, is 55 to 60%. The left ventricle has normal function. The left ventricle has no regional wall motion abnormalities. Left ventricular diastolic parameters are consistent with Grade II diastolic dysfunction (pseudonormalization). Elevated left atrial pressure.  2. Right ventricular systolic function is normal. The right ventricular size is normal.  3. The mitral valve is normal in structure. Trivial mitral valve regurgitation. No evidence of mitral stenosis.  4. The aortic valve has an indeterminant number of cusps. Aortic valve regurgitation  is not visualized. No aortic stenosis is present.  5. The inferior vena cava is normal in size with greater than 50% respiratory variability, suggesting right atrial pressure of 3 mmHg. FINDINGS  Left Ventricle: Left ventricular ejection fraction, by estimation, is 55 to 60%. The left  ventricle has normal function. The left ventricle has no regional wall motion abnormalities. The left ventricular internal cavity size was normal in size. There is  no left ventricular hypertrophy. Left ventricular diastolic parameters are consistent with Grade II diastolic dysfunction (pseudonormalization). Elevated left atrial pressure. Right Ventricle: The right ventricular size is normal.Right ventricular systolic function is normal. Left Atrium: Left atrial size was normal in size. Right Atrium: Right atrial size was normal in size. Pericardium: There is no evidence of pericardial effusion. Mitral Valve: The mitral valve is normal in structure. Mild mitral annular calcification. Trivial mitral valve regurgitation. No evidence of mitral valve stenosis. Tricuspid Valve: The tricuspid valve is normal in structure. Tricuspid valve regurgitation is trivial. No evidence of tricuspid stenosis. Aortic Valve: The aortic valve has an indeterminant number of cusps. Aortic valve regurgitation is not visualized. No aortic stenosis is present. Pulmonic Valve: The pulmonic valve was not well visualized. Pulmonic valve regurgitation is not visualized. No evidence of pulmonic stenosis. Aorta: The aortic root is normal in size and structure. Venous: The inferior vena cava is normal in size with greater than 50% respiratory variability, suggesting right atrial pressure of 3 mmHg. IAS/Shunts: No atrial level shunt detected by color flow Doppler.  LEFT VENTRICLE PLAX 2D LVIDd:         4.70 cm  Diastology LVIDs:         3.40 cm  LV e' medial:    4.57 cm/s LV PW:         0.90 cm  LV E/e' medial:  33.3 LV IVS:        1.10 cm  LV e' lateral:   7.18 cm/s LVOT diam:     2.10 cm  LV E/e' lateral: 21.2 LV SV:         73 LV SV Index:   36 LVOT Area:     3.46 cm  RIGHT VENTRICLE             IVC RV S prime:     13.60 cm/s  IVC diam: 2.20 cm TAPSE (M-mode): 1.5 cm LEFT ATRIUM             Index LA diam:        3.90 cm 1.91 cm/m LA Vol (A2C):    51.9 ml 25.39 ml/m LA Vol (A4C):   63.4 ml 31.02 ml/m LA Biplane Vol: 61.0 ml 29.85 ml/m  AORTIC VALVE LVOT Vmax:   106.00 cm/s LVOT Vmean:  72.100 cm/s LVOT VTI:    0.211 m  AORTA Ao Root diam: 2.90 cm MITRAL VALVE MV Area (PHT): 3.72 cm     SHUNTS MV Decel Time: 204 msec     Systemic VTI:  0.21 m MV E velocity: 152.00 cm/s  Systemic Diam: 2.10 cm MV A velocity: 93.00 cm/s MV E/A ratio:  1.63 Kirk Ruths MD Electronically signed by Kirk Ruths MD Signature Date/Time: 09/25/2020/4:45:44 PM    Final     Subjective: No complaints today, working with PT regularly. No nausea or vomiting or headache.   Discharge Exam: Vitals:   09/28/20 0345 09/28/20 0737  BP: (!) 180/67 (!) 141/70  Pulse: 89 89  Resp: 18 18  Temp: 98.9 F (37.2 C)  98.6 F (37 C)  SpO2: 97% 98%   General: Pt is alert, awake, not in acute distress Cardiovascular: RRR, S1/S2 +, no rubs, no gallops Respiratory: CTA bilaterally, no wheezing, no rhonchi Abdominal: Soft, NT, ND, bowel sounds + Extremities: Minimal edema, no cyanosis  Labs: BNP (last 3 results) Recent Labs    12/26/19 0818 09/24/20 2137  BNP 116.0* 0000000*   Basic Metabolic Panel: Recent Labs  Lab 09/24/20 2020 09/24/20 2137 09/25/20 0440 09/25/20 0811 09/26/20 0323 09/27/20 0127  NA 145 138 137  --  139 138  K 2.3* 3.9 4.1  --  3.9 3.9  CL 122* 103 101  --  107 107  CO2  --  25 25  --  24 25  GLUCOSE 217* 345* 424*  --  147* 220*  BUN 18 28* 27*  --  29* 25*  CREATININE 0.70 1.58* 1.93*  --  2.16* 1.78*  CALCIUM  --  8.8* 8.5*  --  8.3* 8.6*  MG  --   --   --  2.3  --   --   PHOS  --   --   --   --  3.5  --    Liver Function Tests: Recent Labs  Lab 09/26/20 0323  ALBUMIN 2.8*   No results for input(s): LIPASE, AMYLASE in the last 168 hours. No results for input(s): AMMONIA in the last 168 hours. CBC: Recent Labs  Lab 09/24/20 2020 09/24/20 2137 09/26/20 0323 09/27/20 0127  WBC  --  10.4 8.3 8.0  NEUTROABS  --  7.9*  --    --   HGB 7.1* 12.7 10.6* 10.1*  HCT 21.0* 40.8 32.8* 31.5*  MCV  --  92.7 89.1 89.0  PLT  --  253 213 227   Cardiac Enzymes: No results for input(s): CKTOTAL, CKMB, CKMBINDEX, TROPONINI in the last 168 hours. BNP: Invalid input(s): POCBNP CBG: Recent Labs  Lab 09/27/20 0640 09/27/20 1126 09/27/20 1630 09/27/20 2110 09/28/20 0621  GLUCAP 250* 241* 254* 236* 314*   D-Dimer No results for input(s): DDIMER in the last 72 hours. Hgb A1c Recent Labs    09/26/20 0323  HGBA1C 9.9*   Lipid Profile Recent Labs    09/26/20 0323  CHOL 129  HDL 29*  LDLCALC 73  TRIG 133  CHOLHDL 4.4   Thyroid function studies No results for input(s): TSH, T4TOTAL, T3FREE, THYROIDAB in the last 72 hours.  Invalid input(s): FREET3 Anemia work up No results for input(s): VITAMINB12, FOLATE, FERRITIN, TIBC, IRON, RETICCTPCT in the last 72 hours. Urinalysis    Component Value Date/Time   COLORURINE YELLOW 09/25/2020 0806   APPEARANCEUR HAZY (A) 09/25/2020 0806   LABSPEC 1.024 09/25/2020 0806   PHURINE 5.0 09/25/2020 0806   GLUCOSEU >=500 (A) 09/25/2020 0806   HGBUR NEGATIVE 09/25/2020 0806   BILIRUBINUR NEGATIVE 09/25/2020 0806   KETONESUR NEGATIVE 09/25/2020 0806   PROTEINUR 100 (A) 09/25/2020 0806   UROBILINOGEN 0.2 02/20/2015 1200   NITRITE NEGATIVE 09/25/2020 0806   LEUKOCYTESUR NEGATIVE 09/25/2020 0806    Microbiology Recent Results (from the past 240 hour(s))  Resp Panel by RT-PCR (Flu A&B, Covid) Nasopharyngeal Swab     Status: None   Collection Time: 09/24/20  8:02 PM   Specimen: Nasopharyngeal Swab; Nasopharyngeal(NP) swabs in vial transport medium  Result Value Ref Range Status   SARS Coronavirus 2 by RT PCR NEGATIVE NEGATIVE Final    Comment: (NOTE) SARS-CoV-2 target nucleic acids are NOT DETECTED.  The SARS-CoV-2 RNA  is generally detectable in upper respiratory specimens during the acute phase of infection. The lowest concentration of SARS-CoV-2 viral copies this  assay can detect is 138 copies/mL. A negative result does not preclude SARS-Cov-2 infection and should not be used as the sole basis for treatment or other patient management decisions. A negative result may occur with  improper specimen collection/handling, submission of specimen other than nasopharyngeal swab, presence of viral mutation(s) within the areas targeted by this assay, and inadequate number of viral copies(<138 copies/mL). A negative result must be combined with clinical observations, patient history, and epidemiological information. The expected result is Negative.  Fact Sheet for Patients:  EntrepreneurPulse.com.au  Fact Sheet for Healthcare Providers:  IncredibleEmployment.be  This test is no t yet approved or cleared by the [place] FDA and  has been authorized for detection and/or diagnosis of SARS-CoV-2 by FDA under an Emergency Use Authorization (EUA). This EUA will remain  in effect (meaning this test can be used) for the duration of the COVID-19 declaration under Section 564(b)(1) of the Act, 21 U.S.C.section 360bbb-3(b)(1), unless the authorization is terminated  or revoked sooner.       Influenza A by PCR NEGATIVE NEGATIVE Final   Influenza B by PCR NEGATIVE NEGATIVE Final    Comment: (NOTE) The Xpert Xpress SARS-CoV-2/FLU/RSV plus assay is intended as an aid in the diagnosis of influenza from Nasopharyngeal swab specimens and should not be used as a sole basis for treatment. Nasal washings and aspirates are unacceptable for Xpert Xpress SARS-CoV-2/FLU/RSV testing.  Fact Sheet for Patients: EntrepreneurPulse.com.au  Fact Sheet for Healthcare Providers: IncredibleEmployment.be  This test is not yet approved or cleared by the [place] FDA and has been authorized for detection and/or diagnosis of SARS-CoV-2 by FDA under an Emergency Use Authorization (EUA). This EUA will  remain in effect (meaning this test can be used) for the duration of the COVID-19 declaration under Section 564(b)(1) of the Act, 21 U.S.C. section 360bbb-3(b)(1), unless the authorization is terminated or revoked.  Performed at [facility], [address] [place], [place] [address]   MRSA PCR Screening     Status: None   Collection Time: 09/25/20 12:38 AM   Specimen: Nasal Mucosa; Nasopharyngeal  Result Value Ref Range Status   MRSA by PCR NEGATIVE NEGATIVE Final    Comment:        The GeneXpert MRSA Assay (FDA approved for NASAL specimens only), is one component of a comprehensive MRSA colonization surveillance program. It is not intended to diagnose MRSA infection nor to guide or monitor treatment for MRSA infections. Performed at [facility], [place] [address], [place], [place] [address]     Time coordinating discharge: Approximately 40 minutes  Patrecia Pour, MD  [facility] 09/28/2020, 10:26 AM

## 2020-09-28 NOTE — Progress Notes (Addendum)
STROKE TEAM PROGRESS NOTE   STROKE TEAM PROGRESS NOTE   INTERVAL HISTORY   Her neurologic exam is stable.  Remains alert and conversant.  Vital signs stable.  Blood pressure adequately controlled.  Neurological exam unchanged We discussed her hemorrhage diagnosis, work up, and plan of care. Her questions were addressed.  She has insurance authorization for rehab transfer but no bed is available today Vitals:   09/27/20 2045 09/27/20 2353 09/28/20 0345 09/28/20 0737  BP: (!) 163/78 (!) 183/81 (!) 180/67 (!) 141/70  Pulse:  91 89 89  Resp:  '19 18 18  '$ Temp:  98.6 F (37 C) 98.9 F (37.2 C) 98.6 F (37 C)  TempSrc:  Oral Oral Oral  SpO2:  99% 97% 98%  Weight:      Height:       CBC:  Recent Labs  Lab 09/24/20 2137 09/26/20 0323 09/27/20 0127  WBC 10.4 8.3 8.0  NEUTROABS 7.9*  --   --   HGB 12.7 10.6* 10.1*  HCT 40.8 32.8* 31.5*  MCV 92.7 89.1 89.0  PLT 253 213 [id number]   Basic Metabolic Panel:  Recent Labs  Lab 09/25/20 0811 09/26/20 0323 09/27/20 0127  NA  --  139 138  K  --  3.9 3.9  CL  --  107 107  CO2  --  24 25  GLUCOSE  --  147* 220*  BUN  --  29* 25*  CREATININE  --  2.16* 1.78*  CALCIUM  --  8.3* 8.6*  MG 2.3  --   --   PHOS  --  3.5  --    IMAGING and DIAGNOSTICS MRI 09/25/20 Redemonstration of acute intraparenchymal hemorrhage within the right side of the pons and within the inferior cerebellar vermis. Small amount of subarachnoid blood in the perimesencephalic cistern, interfoliar subarachnoid spaces of the posterior fossa and dependentn within the occipital horns of the lateral ventricles. No evidence of increasing or new hemorrhage. No hydrocephalus. Extensive chronic small-vessel ischemic changes elsewhere throughout the brain, many with punctate hemosiderin deposition. Overall findings most consistent with hypertensive disease.  CT head 09/24/20 Demonstrates evidence of a parenchymal hemorrhage in the right half of the pons with suggestion of  local extension into the prepontine cistern. The density seen in the midline posteriorly may represent some dependent spread of hemorrhage although some artifact does remain.  CTA Head and Neck 09/24/20 1. Negative CTA for large vessel occlusion. No vascular abnormality seen underlying the hemorrhages at the pons and cerebellum. 2. Extensive atheromatous change throughout the carotid siphons with associated moderate to advanced diffuse narrowing. 3. Atheromatous change about the V4 segments bilaterally with associated moderate stenoses. 4. Diffuse interlobular septal thickening within the visualized lungs, suggesting pulmonary interstitial edema.  PHYSICAL EXAM Constitutional: Mildly obese elderly African-American lady not in distress.  Psych: Affect appropriate to situation Eyes: No scleral injection HENT: No OP obstrucion Head: Normocephalic.  Cardiovascular: Normal rate and regular rhythm.  Respiratory: Effort normal and breath sounds normal. GI: Soft.  No distension. There is no tenderness.  Skin: Venous stasis changes in bilateral legs  Neuro: Mental Status: Patient is awake, alert, oriented to person, place, month, year, and situation. Patient is able to give a clear and coherent history. No signs of aphasia or neglect She does have a significant dysarthria Cranial Nerves: II: Visual Fields are full. Pupils are equal, round, and reactive to light.   III,IV, VI: EOMI without ptosis or diplopia.  V: Facial sensation is symmetric to temperature  VII: Facial movement is symmetric.  VIII: hearing is intact to voice X: Uvula is midline and palate elevates symmetrically XI: Shoulder shrug is symmetric. XII: tongue is midline without atrophy or fasciculations.  Motor: Tone is normal. Bulk is normal. 5/5 strength was present in bilateral upper extremities, she does have significant 2/5 weakness of the right lower extremity and 4/5 weakness of the left lower  extremity Sensory: Sensation is symmetric to light touch and temperature in the arms and legs. Cerebellar: No clear ataxia on finger-nose-finger  ASSESSMENT/PLAN [name] is a 86 y.o. female with a history of previous stroke, chronic atrial fibrillation on renal dose Eliquis with right hemiparesis, diabetes who presents with cute onset of headache that started abruptly. Slurred speech noted on arrival. 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  She was given Andexxa to reverse her Eliquis. While in the ED she developed shortness of breath and chest x-ray revealed some pulmonary edema and she was started on BiPAP prior to transfer. She was evaluated in the ED and admitted to the neurologic ICU for further evalation and care. Interval history outlined abo e.e   Acute right  intraparenchymal hemorrhage and small amount of subarachnoid blood in the perimesencephalic cistern, interfoliar subarachnoid spaces of the posterior fossa and dependent within the occipital horns of the lateral ventricles while on Eliquis and hypertensive to 150/120 with blood glucose 360.    CT Head Code Stroke: parenchymal hemorrhage in the right half of the pons with suggestion of local extension into the prepontine cistern.   CTA head & neck: No LVO or vascular abnormality  MRI  Right pons/cerebellar  IPH with small SAH   2D Echo EF 55-60%, Grade II diastolic dysfunction, No thrombus, wall motion abnormality or shunt found.   VTE prophylaxis - SCDs    Diet   Diet heart healthy/carb modified Room service appropriate? Yes; Fluid consistency: Thin    On Eliquis 2.'5mg'$  BID prior to admission  No anticoagulants or antiplatelets in setting of hemorrhage   Therapy recommendations:  CIR  Disposition:  CIR pending bed  Hypertension,   Home meds: Hydralazine '75mg'$  BID  Unstable upon arrival with emergency: [id number]  systolic   Hypoxia in the setting of decompensated CHF Was on bipap, now weaned off oxygen  Management per CCM, appreciated  Hyperlipidemia  Home meds:  Zocor '20mg'$    LDL 75, goal < 70  Hold statin in setting of hemorrhage  Consider statin at discharge  AKI   Management per CCM   Diabetes type II Uncontrolled  Insulin drip initially required, no transitioned   Home meds: Novolog SSI with meals, Levemir 80units at hs,   HgbA1c 8.9, goal < 7.0  CBGs Recent Labs    09/27/20 1630 09/27/20 2110 09/28/20 0621  GLUCAP 254* 236* 314*      SSI  Diabetes coordinator following, appreciated. Recommending family be involved in management due to cognitive/memory deficits.   Other Stroke Risk Factors  Advanced Age >/= 10   Associated with increased stroke risk, recommend weight loss, diet and  exercise as appropriate   High risk for Obstructive sleep apnea  Diastolic congestive heart failure   Patient continues to do well and remained stable.  Mobilize out of bed.   Patient has insurance approval for rehab transfer but no bed available  today transfer to inpatient rehab in the next few days.  Discussed with  rehab coordinator.  Transfer attending to hospitalist group     Needs outpt follow up with neurology to assess hemorrhage.    Hospital day # 4   ATTENDING NOTE: I reviewed above note and agree with the assessment and plan. Pt was seen and examined.   86 year old female with history of stroke, A. fib on Eliquis, diabetes admitted for headache and slurred speech.  CT showed pontine ICH and ruptured 2 prepontine cistern.  Reversed with Andexxa.  MRI confirmed small pontine ICH on the right.  EF 55 to 60%.  CT head and neck bilateral ICA 7 and bilateral V4 stenosis.  A1c 9.9, LDL 73.  Creatinine 1.78.  On exam, patient awake, alert, eyes open, mild SOB, orientated to age, place, time. No aphasia, mild dysarthria, following all simple commands. Able to name and  repeat and read. No gaze palsy, tracking bilaterally, visual field full, PERRL. No facial droop. Tongue midline. Bilateral UEs 4/5, no drift. Bilaterally LEs 2/5 proximal and 3+/5 distal PF/DF. Sensation symmetrical bilaterally, b/l FTN intact grossly, gait not tested.   Etiology for patient ICH likely due to hypertension in the setting of Eliquis use.  Currently patient BP still on the higher end, on home BP meds with hydralazine 75 twice daily, Norvasc 10, Imdur 60.  Will increase hydralazine to '100mg'$  Q8h for BP management.  BP goal less than 160.  Long-term BP goal normotensive. She will follow-up with stroke clinic at [facility] in 4 weeks after discharge for further management of anticoagulation given patient A. fib history.  Neurology will sign off. Please call with questions. Pt will follow up with stroke clinic Dr. Leonie Man at [facility] in about 4 weeks. Thanks for the consult.   Rosalin Hawking, MD PhD Stroke Neurology 09/28/2020 1:29 PM

## 2020-09-29 LAB — GLUCOSE, CAPILLARY
Glucose-Capillary: 230 mg/dL — ABNORMAL HIGH (ref 70–99)
Glucose-Capillary: 214 mg/dL — ABNORMAL HIGH (ref 70–99)
Glucose-Capillary: 167 mg/dL — ABNORMAL HIGH (ref 70–99)
Glucose-Capillary: 178 mg/dL — ABNORMAL HIGH (ref 70–99)

## 2020-09-29 MED ORDER — INSULIN ASPART 100 UNIT/ML IJ SOLN
4.0000 [IU] | Freq: Three times a day (TID) | INTRAMUSCULAR | Status: DC
  Administered 2020-09-29 – 2020-09-30 (×6): 4 [IU] via SUBCUTANEOUS

## 2020-09-29 MED ORDER — INSULIN DETEMIR 100 UNIT/ML ~~LOC~~ SOLN
35.0000 [IU] | Freq: Two times a day (BID) | SUBCUTANEOUS | Status: DC
  Administered 2020-09-29 – 2020-09-30 (×3): 35 [IU] via SUBCUTANEOUS
  Filled 2020-09-29 (×4): qty 0.35

## 2020-09-29 NOTE — Progress Notes (Signed)
Occupational Therapy Treatment Patient Details Name: [name] MRN: [id number] DOB: [birth year] Today's Date: 09/29/2020    History of present illness Pt is a 86 y.o. F admitted with acute onset headache. 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 While in ED, she had some SOB and pulmonary edema on chest x-ray and was started on BiPAP. Significant PMH: CHF, CKD, HTN, DM2, prior stroke with right hemiplegia.   OT comments  Limited session d/t elevated BP at rest of 159/59 with SBP goal of <140, therefore session conducted at bed level with pt needing up to MAX A for UB ADLs. Pt would continue to benefit from skilled occupational therapy while admitted and after d/c to address the below listed limitations in order to improve overall functional mobility and facilitate independence with BADL participation. DC plan remains appropriate, will follow acutely per POC.    Follow Up Recommendations  CIR    Equipment Recommendations  3 in 1 bedside commode    Recommendations for Other Services      Precautions / Restrictions Precautions Precautions: Fall Precaution Comments: Goal SBP < 140 Restrictions Weight Bearing Restrictions: No       Mobility Bed Mobility               General bed mobility comments: deferred d/t BP    Transfers                 General transfer comment: deferred d/t BP    Balance                                           ADL either performed or assessed with clinical judgement   ADL Overall ADL's : Needs assistance/impaired     Grooming: Wash/dry face;Bed level;Set up Grooming Details (indicate cue type and reason): washing face from bed level         Upper Body Dressing : Maximal assistance;Bed level Upper Body Dressing Details (indicate cue type and reason): to change gown       Toilet Transfer Details (indicate cue type and reason): deferred OOB mobility d/t elevated BP            General ADL Comments: limited session d/t elevated BP ( SBP goals of >140) 159/59, session completed from bed level     Vision       Perception     Praxis      Cognition Arousal/Alertness: Awake/alert Behavior During Therapy: WFL for tasks assessed/performed Overall Cognitive Status: No family/caregiver present to determine baseline cognitive functioning                                 General Comments: overall WFL for simple tasks, not formally assessed        Exercises     Shoulder Instructions       General Comments supine BP at rest 159/59 alerted RN    Pertinent Vitals/ Pain       Pain Assessment: Faces Faces Pain Scale: Hurts a little bit Pain Location: generalized discomfort Pain Descriptors / Indicators: Tightness Pain Intervention(s): Monitored during session  Home Living  Prior Functioning/Environment              Frequency  Min 2X/week        Progress Toward Goals  OT Goals(current goals can now be found in the care plan section)  Progress towards OT goals: Progressing toward goals  Acute Rehab OT Goals Patient Stated Goal: none stated OT Goal Formulation: With patient Time For Goal Achievement: 10/09/20 Potential to Achieve Goals: Good  Plan Discharge plan remains appropriate;Frequency remains appropriate    Co-evaluation                 AM-PAC OT "6 Clicks" Daily Activity     Outcome Measure   Help from another person eating meals?: None Help from another person taking care of personal grooming?: A Little Help from another person toileting, which includes using toliet, bedpan, or urinal?: A Lot Help from another person bathing (including washing, rinsing, drying)?: A Lot Help from another person to put on and taking off regular upper body clothing?: A Little Help from another person to put on and taking off regular lower body clothing?: Total 6  Click Score: 15    End of Session    OT Visit Diagnosis: Unsteadiness on feet (R26.81)   Activity Tolerance Treatment limited secondary to medical complications (Comment);Other (comment) (hypertension)   Patient Left in bed;with call bell/phone within reach;with bed alarm set   Nurse Communication Mobility status;Other (comment) (deferred OOB mobility d/t elevated BP)        Time: 1000-1013 OT Time Calculation (min): 13 min  Charges: OT General Charges $OT Visit: 1 Visit OT Treatments $Self Care/Home Management : 8-22 mins  Harley Alto., COTA/L Acute Rehabilitation Services [phone]    Precious Haws 09/29/2020, 12:31 PM

## 2020-09-29 NOTE — Progress Notes (Signed)
PROGRESS NOTE  [name]  [id number] DOB: 03/09/1935 DOA: 09/24/2020 PCP: [name], MD   Brief Narrative: [name] is an 86 y.o. female with a history of chronic AFib on eliquis, CVA with right hemiparesis, HTN, HFpEF, stage III CKD, IDT2DM who presented with a headache due to an acute right pontine hemorrhage in the setting of anticoagulation and hypertension. She was found to have pulmonary edema started on CXR, found also to be hyperglycemic requiring insulin infusion and with AKI improved with IVF. Neurology admitted the patient who was started on clevidipine infusion with improvement in BP. Andexxa was given to reverse eliquis. Ultimately transferred out of ICU with plans to admit to CIR.   Assessment & Plan: Active Problems:   ICH (intracerebral hemorrhage) (HCC)  ICH with small SAH:  - Per neurology, planning dispo to CIR when bed available. No anticoagulation/antiplatelets or statin (home med) for now per neuro.  - Needs neurology follow up post discharge  Uncontrolled IDT2DM with hyperglycemia: HbA1c 9.9%. Initially requiring insulin infusion.  - Will increase levemir again to 35 units BID, continue SSI, add mealtime novolog. Home dose still seems quite high, though diet may have necessitated it previously. - DM coordinator following, pt to follow up with endocrinology and RD following admission.   HTN: No longer requiring clevidipine gtt. - Continue home hydralazine, imdur. Continue norvasc '10mg'$  as started here. Increase hydralazine to '100mg'$  q8h w/goal SBP <122mHg.  Paroxysmal atrial fibrillation: Currently in NSR. - Holding eliquis  Acute hypoxic respiratory failure due to acute on chronic HFpEF: LVEF 55-60%, G2DD. - s/p diuresis, holding demadex and IVF for now  AKI on stage IIIb CKD: Improved, may be near baseline with SCr 1.7-1.8.  - Avoid contrast/nephrotoxins.  - Monitor UOP/volume status  Obesity:Estimated body mass index is 30.41 kg/m   DVT  prophylaxis: SCDs Code Status: Full Family Communication: None at bedside Disposition Plan:  Status is: Inpatient  Remains inpatient appropriate because:Unsafe d/c plan   Dispo: The patient is from: Home              Anticipated d/c is to: CIR              Patient currently is medically stable to d/c.   Difficult to place patient No  Consultants:   Neurology, PCCM  Subjective: No complaints today. Breakfast hasn't come, she's hungry. No N/V/D, no new weakness or numbness.   Objective: Vitals:   09/28/20 2358 09/29/20 0341 09/29/20 0757 09/29/20 1005  BP: (!) 158/66 (!) 159/66 (!) 170/61 (!) 159/59  Pulse: 76 73 77   Resp: '19 19 18   '$ Temp: 98.4 F (36.9 C) 98.4 F (36.9 C) 98 F (36.7 C)   TempSrc: Oral Oral Oral   SpO2: 95% 93% 98%   Weight:      Height:        Intake/Output Summary (Last 24 hours) at 09/29/2020 1106 Last data filed at 09/29/2020 0031 Gross per 24 hour  Intake 560 ml  Output 1000 ml  Net -440 ml   Filed Weights   09/24/20 1729  Weight: 90.7 kg   Gen: Chronically ill-appearing elderly female in no distress Pulm: Nonlabored breathing room air. Clear. CV: Regular rate and rhythm. No murmur, rub, or gallop. No JVD, no dependent edema. GI: Abdomen soft, non-tender, non-distended, with normoactive bowel sounds.  Ext: Warm, no deformities Skin: No rashes, lesions or ulcers on visualized skin. Neuro: Alert and oriented. No new focal neurological deficits. Psych: Judgement and  insight appear fair. Mood euthymic & affect congruent. Behavior is appropriate.    Data Reviewed: I have personally reviewed following labs and imaging studies  CBC: Recent Labs  Lab 09/24/20 2020 09/24/20 2137 09/26/20 0323 09/27/20 0127  WBC  --  10.4 8.3 8.0  NEUTROABS  --  7.9*  --   --   HGB 7.1* 12.7 10.6* 10.1*  HCT 21.0* 40.8 32.8* 31.5*  MCV  --  92.7 89.1 89.0  PLT  --  253 213 [id number]   Basic Metabolic Panel: Recent Labs  Lab 09/24/20 2020 09/24/20 2137  09/25/20 0440 09/25/20 0811 09/26/20 0323 09/27/20 0127  NA 145 138 137  --  139 138  K 2.3* 3.9 4.1  --  3.9 3.9  CL 122* 103 101  --  107 107  CO2  --  25 25  --  24 25  GLUCOSE 217* 345* 424*  --  147* 220*  BUN 18 28* 27*  --  29* 25*  CREATININE 0.70 1.58* 1.93*  --  2.16* 1.78*  CALCIUM  --  8.8* 8.5*  --  8.3* 8.6*  MG  --   --   --  2.3  --   --   PHOS  --   --   --   --  3.5  --    GFR: Estimated Creatinine Clearance: 27.7 mL/min (A) (by C-G formula based on SCr of 1.78 mg/dL (H)). Liver Function Tests: Recent Labs  Lab 09/26/20 0323  ALBUMIN 2.8*   No results for input(s): LIPASE, AMYLASE in the last 168 hours. No results for input(s): AMMONIA in the last 168 hours. Coagulation Profile: Recent Labs  Lab 09/24/20 2137  INR 1.1   Cardiac Enzymes: No results for input(s): CKTOTAL, CKMB, CKMBINDEX, TROPONINI in the last 168 hours. BNP (last 3 results) No results for input(s): PROBNP in the last 8760 hours. HbA1C: No results for input(s): HGBA1C in the last 72 hours. CBG: Recent Labs  Lab 09/28/20 0621 09/28/20 1117 09/28/20 1619 09/28/20 2132 09/29/20 0621  GLUCAP 314* 275* 218* 250* 230*   Lipid Profile: No results for input(s): CHOL, HDL, LDLCALC, TRIG, CHOLHDL, LDLDIRECT in the last 72 hours. Thyroid Function Tests: No results for input(s): TSH, T4TOTAL, FREET4, T3FREE, THYROIDAB in the last 72 hours. Anemia Panel: No results for input(s): VITAMINB12, FOLATE, FERRITIN, TIBC, IRON, RETICCTPCT in the last 72 hours. Urine analysis:    Component Value Date/Time   COLORURINE YELLOW 09/25/2020 0806   APPEARANCEUR HAZY (A) 09/25/2020 0806   LABSPEC 1.024 09/25/2020 0806   PHURINE 5.0 09/25/2020 0806   GLUCOSEU >=500 (A) 09/25/2020 0806   HGBUR NEGATIVE 09/25/2020 0806   BILIRUBINUR NEGATIVE 09/25/2020 0806   KETONESUR NEGATIVE 09/25/2020 0806   PROTEINUR 100 (A) 09/25/2020 0806   UROBILINOGEN 0.2 02/20/2015 1200   NITRITE NEGATIVE 09/25/2020 0806    LEUKOCYTESUR NEGATIVE 09/25/2020 0806   Recent Results (from the past 240 hour(s))  Resp Panel by RT-PCR (Flu A&B, Covid) Nasopharyngeal Swab     Status: None   Collection Time: 09/24/20  8:02 PM   Specimen: Nasopharyngeal Swab; Nasopharyngeal(NP) swabs in vial transport medium  Result Value Ref Range Status   SARS Coronavirus 2 by RT PCR NEGATIVE NEGATIVE Final    Comment: (NOTE) SARS-CoV-2 target nucleic acids are NOT DETECTED.  The SARS-CoV-2 RNA is generally detectable in upper respiratory specimens during the acute phase of infection. The lowest concentration of SARS-CoV-2 viral copies this assay can detect is 138 copies/mL. A  negative result does not preclude SARS-Cov-2 infection and should not be used as the sole basis for treatment or other patient management decisions. A negative result may occur with  improper specimen collection/handling, submission of specimen other than nasopharyngeal swab, presence of viral mutation(s) within the areas targeted by this assay, and inadequate number of viral copies(<138 copies/mL). A negative result must be combined with clinical observations, patient history, and epidemiological information. The expected result is Negative.  Fact Sheet for Patients:  EntrepreneurPulse.com.au  Fact Sheet for Healthcare Providers:  IncredibleEmployment.be  This test is no t yet approved or cleared by the [place] FDA and  has been authorized for detection and/or diagnosis of SARS-CoV-2 by FDA under an Emergency Use Authorization (EUA). This EUA will remain  in effect (meaning this test can be used) for the duration of the COVID-19 declaration under Section 564(b)(1) of the Act, 21 U.S.C.section 360bbb-3(b)(1), unless the authorization is terminated  or revoked sooner.       Influenza A by PCR NEGATIVE NEGATIVE Final   Influenza B by PCR NEGATIVE NEGATIVE Final    Comment: (NOTE) The Xpert Xpress  SARS-CoV-2/FLU/RSV plus assay is intended as an aid in the diagnosis of influenza from Nasopharyngeal swab specimens and should not be used as a sole basis for treatment. Nasal washings and aspirates are unacceptable for Xpert Xpress SARS-CoV-2/FLU/RSV testing.  Fact Sheet for Patients: EntrepreneurPulse.com.au  Fact Sheet for Healthcare Providers: IncredibleEmployment.be  This test is not yet approved or cleared by the [place] FDA and has been authorized for detection and/or diagnosis of SARS-CoV-2 by FDA under an Emergency Use Authorization (EUA). This EUA will remain in effect (meaning this test can be used) for the duration of the COVID-19 declaration under Section 564(b)(1) of the Act, 21 U.S.C. section 360bbb-3(b)(1), unless the authorization is terminated or revoked.  Performed at [facility], [name] [place], [place] [address]   MRSA PCR Screening     Status: None   Collection Time: 09/25/20 12:38 AM   Specimen: Nasal Mucosa; Nasopharyngeal  Result Value Ref Range Status   MRSA by PCR NEGATIVE NEGATIVE Final    Comment:        The GeneXpert MRSA Assay (FDA approved for NASAL specimens only), is one component of a comprehensive MRSA colonization surveillance program. It is not intended to diagnose MRSA infection nor to guide or monitor treatment for MRSA infections. Performed at [facility], [place] [address] [place], [place] [address]       Radiology Studies: No results found.  Scheduled Meds: . amLODipine  10 mg Oral Daily  . Chlorhexidine Gluconate Cloth  6 each Topical Daily  . hydrALAZINE  100 mg Oral Q8H  . insulin aspart  0-20 Units Subcutaneous TID WC  . insulin aspart  0-5 Units Subcutaneous QHS  . insulin aspart  4 Units Subcutaneous TID WC  . insulin detemir  35 Units Subcutaneous BID  . isosorbide mononitrate  60 mg Oral Daily  . pantoprazole  40 mg Oral Daily  . senna-docusate  1 tablet  Oral BID   Continuous Infusions:   LOS: 5 days   Time spent: 25 minutes.  Patrecia Pour, MD [facility] [email or web address] 09/29/2020, 11:06 AM

## 2020-09-29 NOTE — Evaluation (Signed)
Clinical/Bedside Swallow Evaluation Patient Details  Name: [name] MRN: [id number] Date of Birth: Sep 26, 1934  Today's Date: 09/29/2020 Time: SLP Start Time (ACUTE ONLY): 1238 SLP Stop Time (ACUTE ONLY): 1253 SLP Time Calculation (min) (ACUTE ONLY): 15 min  Past Medical History:  Past Medical History:  Diagnosis Date  . Arthritis   . CHF (congestive heart failure) ([place])   . Chronic diastolic heart failure ([place])   . CKD (chronic kidney disease) stage 3, GFR 30-59 ml/min (HCC)   . Essential hypertension   . GERD (gastroesophageal reflux disease)   . Hemorrhoids   . History of stroke   . Type 2 diabetes mellitus ([place])    Past Surgical History:  Past Surgical History:  Procedure Laterality Date  . BACK SURGERY    . CATARACT EXTRACTION W/PHACO Left 02/28/2013   Procedure: CATARACT EXTRACTION PHACO AND INTRAOCULAR LENS PLACEMENT (IOL) CDE=15.60;  Surgeon: Elta Guadeloupe T. Gershon Crane, MD;  Location: [facility]S;  Service: Ophthalmology;  Laterality: Left;  . CHOLECYSTECTOMY    . COLONOSCOPY  12/12/2003   RMR: Normal rectum.  Long capacious tortuous colon, colonic mucosa appeared normal  . COLONOSCOPY N/A 03/21/2014   Dr. [name]: internal and external hemorrhoids, torturous colon, colonic diverticulosis   . ESOPHAGOGASTRODUODENOSCOPY  12/28/2001   [id number] sliding hiatal hernia/. Three small bulbar ulcers, two with stigmata of bleeding and these were coagulated using heater probe unit   . ESOPHAGOGASTRODUODENOSCOPY N/A 03/21/2014   Dr. [name]: cervical esophageal web s/p dilation, negative H.pylori   . EYE SURGERY    . HIP FRACTURE SURGERY  2008  . JOINT REPLACEMENT     Rt hip, ????? sounds like just for fracture  . MALONEY DILATION N/A 03/21/2014   Procedure: [name] DILATION;  Surgeon: [name], MD;  Location: [facility] SU[facility];  Service: Endoscopy;  Laterality: N/A;  . SAVORY DILATION N/A 03/21/2014   Procedure: SAVORY DILATION;  Surgeon: [name], MD;  Location: [facility];  Service:  Endoscopy;  Laterality: N/A;   HPI:  Pt is a 86 y.o. F admitted with acute onset headache. 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 While in ED, she had some SOB and pulmonary edema on chest x-ray and was started on BiPAP. Significant PMH: CHF, CKD, HTN, DM2, prior stroke with right hemiplegia.   Assessment / Plan / Recommendation Clinical Impression  Pt was seen for a bedside swallow evaluation and she presents with oral dysphagia secondary to missing dentition with suspected pharyngeal dysphagia.  Pt was encountered awake/alert and was agreeable to this evaluation.  Pt reported that she choked on oatmeal this morning during breakfast.  She stated that it appeared to be an isolated incident, but that she has been clearing her throat all morning following the incident. Throat clearing was observed upon SLP arrival in the absence of PO.  Oral mechanism examination was remarkable for missing dentition (partial bottom dentures at home), otherwise was [facility].  Pt consumed trials of thin liquid, puree, and regular solids.  She exhibited delayed throat clearing intermittently with thin liquid and regular solid trials; however, unable to determine if it was related to PO intake given baseline throat clear.  No clinical s/sx of aspiration observed with puree trials.  Mastication of regular solids was moderately prolonged secondary to missing dentition with trace oral residue observed.  Recommend diet change to Dysphagia 2 (fine chopped) solids and thin liquids with medication administered whole in puree.  Cut/crush large pills.  SLP will f/u  to monitor diet tolerance and advance as appropriate.  SLP Visit Diagnosis: Dysphagia, unspecified (R13.10)    Aspiration Risk  Mild aspiration risk    Diet Recommendation Dysphagia 2 (Fine chop);Thin liquid   Liquid Administration via: Cup;Straw Medication Administration: Whole meds with puree (Cut/crush large pills) Supervision: Staff  to assist with self feeding Compensations: Minimize environmental distractions;Slow rate;Small sips/bites Postural Changes: Seated upright at 90 degrees    Other  Recommendations Oral Care Recommendations: Oral care BID   Follow up Recommendations Other (comment) (defer to PT/OT recs)      Frequency and Duration min 2x/week  2 weeks       Prognosis Prognosis for Safe Diet Advancement: Good Barriers to Reach Goals: Cognitive deficits      Swallow Study   General HPI: Pt is a 86 y.o. F admitted with acute onset headache. 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 While in ED, she had some SOB and pulmonary edema on chest x-ray and was started on BiPAP. Significant PMH: CHF, CKD, HTN, DM2, prior stroke with right hemiplegia. Type of Study: Bedside Swallow Evaluation Previous Swallow Assessment: None Diet Prior to this Study: Regular;Thin liquids Temperature Spikes Noted: No Respiratory Status: Room air History of Recent Intubation: No Behavior/Cognition: Alert;Cooperative;Pleasant mood Oral Cavity Assessment: Within Functional Limits Oral Care Completed by SLP: No Oral Cavity - Dentition: Missing dentition (Partial bottom dentures at home) Vision: Functional for self-feeding Self-Feeding Abilities: Needs assist Patient Positioning: Upright in bed Baseline Vocal Quality: Normal Volitional Swallow: Able to elicit    Oral/Motor/Sensory Function Overall Oral Motor/Sensory Function: Within functional limits   Ice Chips Ice chips: Not tested   Thin Liquid Thin Liquid: Impaired Presentation: Straw Pharyngeal  Phase Impairments: Throat Clearing - Delayed    Nectar Thick Nectar Thick Liquid: Not tested   Honey Thick Honey Thick Liquid: Not tested   Puree Puree: Within functional limits Presentation: Spoon   Solid     Solid: Impaired Presentation: Spoon Oral Phase Impairments: Impaired mastication Oral Phase Functional Implications: Impaired  mastication;Prolonged oral transit Pharyngeal Phase Impairments: Throat Clearing - Delayed     Colin Mulders M.S., CCC-SLP Acute Rehabilitation Services Office: [phone]  Elvia Collum Kailin Leu 09/29/2020,1:14 PM

## 2020-09-30 ENCOUNTER — Other Ambulatory Visit: Payer: Self-pay

## 2020-09-30 ENCOUNTER — Encounter (HOSPITAL_COMMUNITY): Payer: Self-pay | Admitting: Physical Medicine & Rehabilitation

## 2020-09-30 ENCOUNTER — Inpatient Hospital Stay (HOSPITAL_COMMUNITY)
Admission: RE | Admit: 2020-09-30 | Discharge: 2020-10-11 | DRG: 057 | Disposition: A | Discharge status: Other Acute Inpatient Hospital | Payer: Medicare HMO | Source: Intra-hospital | Attending: Physical Medicine & Rehabilitation | Admitting: Physical Medicine & Rehabilitation

## 2020-09-30 DIAGNOSIS — K5901 Slow transit constipation: Secondary | ICD-10-CM | POA: Diagnosis not present

## 2020-09-30 DIAGNOSIS — Z7901 Long term (current) use of anticoagulants: Secondary | ICD-10-CM | POA: Diagnosis not present

## 2020-09-30 DIAGNOSIS — M170 Bilateral primary osteoarthritis of knee: Secondary | ICD-10-CM | POA: Diagnosis present

## 2020-09-30 DIAGNOSIS — I69128 Other speech and language deficits following nontraumatic intracerebral hemorrhage: Secondary | ICD-10-CM | POA: Diagnosis not present

## 2020-09-30 DIAGNOSIS — Y95 Nosocomial condition: Secondary | ICD-10-CM | POA: Diagnosis not present

## 2020-09-30 DIAGNOSIS — Z833 Family history of diabetes mellitus: Secondary | ICD-10-CM

## 2020-09-30 DIAGNOSIS — R110 Nausea: Secondary | ICD-10-CM

## 2020-09-30 DIAGNOSIS — I130 Hypertensive heart and chronic kidney disease with heart failure and stage 1 through stage 4 chronic kidney disease, or unspecified chronic kidney disease: Secondary | ICD-10-CM | POA: Diagnosis present

## 2020-09-30 DIAGNOSIS — Z9049 Acquired absence of other specified parts of digestive tract: Secondary | ICD-10-CM

## 2020-09-30 DIAGNOSIS — R0603 Acute respiratory distress: Secondary | ICD-10-CM | POA: Diagnosis not present

## 2020-09-30 DIAGNOSIS — Z9989 Dependence on other enabling machines and devices: Secondary | ICD-10-CM | POA: Diagnosis not present

## 2020-09-30 DIAGNOSIS — I619 Nontraumatic intracerebral hemorrhage, unspecified: Secondary | ICD-10-CM | POA: Diagnosis not present

## 2020-09-30 DIAGNOSIS — I5033 Acute on chronic diastolic (congestive) heart failure: Secondary | ICD-10-CM | POA: Diagnosis not present

## 2020-09-30 DIAGNOSIS — K219 Gastro-esophageal reflux disease without esophagitis: Secondary | ICD-10-CM | POA: Diagnosis present

## 2020-09-30 DIAGNOSIS — I482 Chronic atrial fibrillation, unspecified: Secondary | ICD-10-CM | POA: Diagnosis present

## 2020-09-30 DIAGNOSIS — I69351 Hemiplegia and hemiparesis following cerebral infarction affecting right dominant side: Principal | ICD-10-CM

## 2020-09-30 DIAGNOSIS — I69121 Dysphasia following nontraumatic intracerebral hemorrhage: Secondary | ICD-10-CM

## 2020-09-30 DIAGNOSIS — M25561 Pain in right knee: Secondary | ICD-10-CM

## 2020-09-30 DIAGNOSIS — R059 Cough, unspecified: Secondary | ICD-10-CM

## 2020-09-30 DIAGNOSIS — J810 Acute pulmonary edema: Secondary | ICD-10-CM | POA: Diagnosis not present

## 2020-09-30 DIAGNOSIS — G8929 Other chronic pain: Secondary | ICD-10-CM

## 2020-09-30 DIAGNOSIS — K5900 Constipation, unspecified: Secondary | ICD-10-CM | POA: Diagnosis present

## 2020-09-30 DIAGNOSIS — M47816 Spondylosis without myelopathy or radiculopathy, lumbar region: Secondary | ICD-10-CM | POA: Diagnosis not present

## 2020-09-30 DIAGNOSIS — I69321 Dysphasia following cerebral infarction: Secondary | ICD-10-CM | POA: Diagnosis not present

## 2020-09-30 DIAGNOSIS — E1122 Type 2 diabetes mellitus with diabetic chronic kidney disease: Secondary | ICD-10-CM | POA: Diagnosis present

## 2020-09-30 DIAGNOSIS — R000 Tachycardia, unspecified: Secondary | ICD-10-CM

## 2020-09-30 DIAGNOSIS — N1832 Chronic kidney disease, stage 3b: Secondary | ICD-10-CM | POA: Diagnosis present

## 2020-09-30 DIAGNOSIS — E8770 Fluid overload, unspecified: Secondary | ICD-10-CM | POA: Diagnosis not present

## 2020-09-30 DIAGNOSIS — Z8249 Family history of ischemic heart disease and other diseases of the circulatory system: Secondary | ICD-10-CM | POA: Diagnosis not present

## 2020-09-30 DIAGNOSIS — I69122 Dysarthria following nontraumatic intracerebral hemorrhage: Secondary | ICD-10-CM | POA: Diagnosis not present

## 2020-09-30 DIAGNOSIS — E1165 Type 2 diabetes mellitus with hyperglycemia: Secondary | ICD-10-CM | POA: Diagnosis not present

## 2020-09-30 DIAGNOSIS — J189 Pneumonia, unspecified organism: Secondary | ICD-10-CM | POA: Diagnosis not present

## 2020-09-30 DIAGNOSIS — I5032 Chronic diastolic (congestive) heart failure: Secondary | ICD-10-CM | POA: Diagnosis not present

## 2020-09-30 DIAGNOSIS — Z794 Long term (current) use of insulin: Secondary | ICD-10-CM | POA: Diagnosis not present

## 2020-09-30 DIAGNOSIS — I10 Essential (primary) hypertension: Secondary | ICD-10-CM | POA: Diagnosis not present

## 2020-09-30 DIAGNOSIS — Z79899 Other long term (current) drug therapy: Secondary | ICD-10-CM

## 2020-09-30 DIAGNOSIS — Z9851 Tubal ligation status: Secondary | ICD-10-CM | POA: Diagnosis not present

## 2020-09-30 DIAGNOSIS — J9601 Acute respiratory failure with hypoxia: Secondary | ICD-10-CM | POA: Diagnosis not present

## 2020-09-30 DIAGNOSIS — R2689 Other abnormalities of gait and mobility: Secondary | ICD-10-CM | POA: Diagnosis present

## 2020-09-30 LAB — GLUCOSE, CAPILLARY
Glucose-Capillary: 229 mg/dL — ABNORMAL HIGH (ref 70–99)
Glucose-Capillary: 243 mg/dL — ABNORMAL HIGH (ref 70–99)
Glucose-Capillary: 234 mg/dL — ABNORMAL HIGH (ref 70–99)
Glucose-Capillary: 190 mg/dL — ABNORMAL HIGH (ref 70–99)

## 2020-09-30 MED ORDER — ACETAMINOPHEN 325 MG PO TABS
650.0000 mg | ORAL_TABLET | ORAL | Status: DC | PRN
  Administered 2020-10-01 – 2020-10-10 (×6): 650 mg via ORAL
  Filled 2020-09-30 (×8): qty 2

## 2020-09-30 MED ORDER — SALINE SPRAY 0.65 % NA SOLN
1.0000 | NASAL | Status: DC | PRN
  Filled 2020-09-30: qty 44

## 2020-09-30 MED ORDER — INSULIN DETEMIR 100 UNIT/ML ~~LOC~~ SOLN: 40.0000 [IU] | Freq: Two times a day (BID) | SUBCUTANEOUS | Status: DC

## 2020-09-30 MED ORDER — HYDRALAZINE HCL 50 MG PO TABS
100.0000 mg | ORAL_TABLET | Freq: Three times a day (TID) | ORAL | Status: DC
  Administered 2020-09-30 – 2020-10-11 (×28): 100 mg via ORAL
  Filled 2020-09-30 (×33): qty 2

## 2020-09-30 MED ORDER — SENNOSIDES-DOCUSATE SODIUM 8.6-50 MG PO TABS
1.0000 | ORAL_TABLET | Freq: Two times a day (BID) | ORAL | Status: DC
  Administered 2020-09-30 – 2020-10-09 (×18): 1 via ORAL
  Filled 2020-09-30 (×18): qty 1

## 2020-09-30 MED ORDER — ISOSORBIDE MONONITRATE ER 30 MG PO TB24
60.0000 mg | ORAL_TABLET | Freq: Every day | ORAL | Status: DC
  Administered 2020-10-01 – 2020-10-10 (×10): 60 mg via ORAL
  Filled 2020-09-30 (×10): qty 2

## 2020-09-30 MED ORDER — INSULIN DETEMIR 100 UNIT/ML ~~LOC~~ SOLN
35.0000 [IU] | Freq: Two times a day (BID) | SUBCUTANEOUS | Status: DC
  Administered 2020-09-30 – 2020-10-01 (×2): 35 [IU] via SUBCUTANEOUS
  Filled 2020-09-30 (×3): qty 0.35

## 2020-09-30 MED ORDER — ACETAMINOPHEN 160 MG/5ML PO SOLN: 650.0000 mg | ORAL | Status: DC | PRN

## 2020-09-30 MED ORDER — HYDRALAZINE HCL 100 MG PO TABS: 100.0000 mg | ORAL_TABLET | Freq: Three times a day (TID) | ORAL | Status: AC

## 2020-09-30 MED ORDER — INSULIN ASPART 100 UNIT/ML IJ SOLN
0.0000 [IU] | Freq: Three times a day (TID) | INTRAMUSCULAR | Status: DC
  Administered 2020-10-01 (×2): 7 [IU] via SUBCUTANEOUS
  Administered 2020-10-01 – 2020-10-02 (×2): 11 [IU] via SUBCUTANEOUS
  Administered 2020-10-02: 15 [IU] via SUBCUTANEOUS
  Administered 2020-10-02 – 2020-10-03 (×2): 7 [IU] via SUBCUTANEOUS
  Administered 2020-10-03: 11 [IU] via SUBCUTANEOUS
  Administered 2020-10-03 – 2020-10-04 (×3): 4 [IU] via SUBCUTANEOUS
  Administered 2020-10-04: 7 [IU] via SUBCUTANEOUS
  Administered 2020-10-05 (×2): 11 [IU] via SUBCUTANEOUS
  Administered 2020-10-06: 7 [IU] via SUBCUTANEOUS
  Administered 2020-10-06: 4 [IU] via SUBCUTANEOUS
  Administered 2020-10-06: 7 [IU] via SUBCUTANEOUS
  Administered 2020-10-07 (×2): 4 [IU] via SUBCUTANEOUS
  Administered 2020-10-07: 11 [IU] via SUBCUTANEOUS
  Administered 2020-10-08: 4 [IU] via SUBCUTANEOUS
  Administered 2020-10-08: 3 [IU] via SUBCUTANEOUS
  Administered 2020-10-08: 7 [IU] via SUBCUTANEOUS
  Administered 2020-10-09: 3 [IU] via SUBCUTANEOUS
  Administered 2020-10-09 (×2): 4 [IU] via SUBCUTANEOUS
  Administered 2020-10-10: 7 [IU] via SUBCUTANEOUS
  Administered 2020-10-10: 4 [IU] via SUBCUTANEOUS
  Administered 2020-10-10: 7 [IU] via SUBCUTANEOUS

## 2020-09-30 MED ORDER — INSULIN ASPART 100 UNIT/ML IJ SOLN: 4.0000 [IU] | Freq: Three times a day (TID) | INTRAMUSCULAR | Status: DC

## 2020-09-30 MED ORDER — INSULIN ASPART 100 UNIT/ML IJ SOLN
5.0000 [IU] | Freq: Three times a day (TID) | INTRAMUSCULAR | Status: DC
  Administered 2020-10-01 – 2020-10-10 (×26): 5 [IU] via SUBCUTANEOUS

## 2020-09-30 MED ORDER — AMLODIPINE BESYLATE 10 MG PO TABS
10.0000 mg | ORAL_TABLET | Freq: Every day | ORAL | Status: DC
  Administered 2020-10-01 – 2020-10-10 (×10): 10 mg via ORAL
  Filled 2020-09-30 (×10): qty 1

## 2020-09-30 MED ORDER — ACETAMINOPHEN 650 MG RE SUPP: 650.0000 mg | RECTAL | Status: DC | PRN

## 2020-09-30 MED ORDER — PANTOPRAZOLE SODIUM 40 MG PO TBEC
40.0000 mg | DELAYED_RELEASE_TABLET | Freq: Every day | ORAL | Status: DC
  Administered 2020-10-01 – 2020-10-10 (×10): 40 mg via ORAL
  Filled 2020-09-30 (×10): qty 1

## 2020-09-30 NOTE — Progress Notes (Signed)
Inpatient Rehabilitation Medication Review by a Pharmacist  A complete drug regimen review was completed for this patient to identify any potential clinically significant medication issues.  Clinically significant medication issues were identified:  yes   Type of Medication Issue Identified Description of Issue Urgent (address now) Non-Urgent (address on AM team rounds) Plan Plan Accepted by Provider? (Yes / No / Pending AM Rounds)  Drug Interaction(s) (clinically significant)       Duplicate Therapy       Allergy       No Medication Administration End Date       Incorrect Dose  Insulin detemir 40 units BID on discharge summary Novolog 6 units TIDWM on discharge summary Non-urgent Increase doses of insulin to 40 units BID of insulin detemir and 6 units TIDWM of novolog   Additional Drug Therapy Needed  Albuterol, gabapentin, and torsemide not reordered on admission Non-urgent Add albuterol prn, gabapentin, and torsemide per discharge med rec   Other         Name of provider notified for urgent issues identified:   Provider Method of Notification: n/a   For non-urgent medication issues to be resolved on team rounds tomorrow morning a [facility] Secure Chat Handoff was sent to: Horton Chin   Pharmacist comments:   Time spent performing this drug regimen review (minutes):  10 minutes   [place] 09/30/2020 10:01 PM

## 2020-09-30 NOTE — Progress Notes (Signed)
Occupational Therapy Treatment Patient Details Name: [name] MRN: [id number] DOB: October 30, 1934 Today's Date: 09/30/2020    History of present illness Pt is a 86 y.o. F admitted with acute onset headache. 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 While in ED, she had some SOB and pulmonary edema on chest x-ray and was started on BiPAP. Significant PMH: CHF, CKD, HTN, DM2, prior stroke with right hemiplegia.   OT comments  Pt limited this session due to elevated BP >160 SBP. RN aware and gave PRN meds immediately. Pt with no s/s of chest pain and vitals remained stable. Pt maxA +2 for bed mobility and sitting EOB for scooting laterally, but maxA+2 for lateral scoots to HOB. Pt wanting to continue with session, but pt advised to rest. Pt attempting to assist, but BLE feet sliding due to posterior lean present. Pt would greatly benefit from continued OT skilled services. OT following acutely.  BP supine 203/72 Sitting 177/66 Sitting: 185/72 after 3 mins at EOB    Follow Up Recommendations  CIR    Equipment Recommendations  3 in 1 bedside commode    Recommendations for Other Services      Precautions / Restrictions Precautions Precautions: Fall Precaution Comments: Goal SBP < 160 Restrictions Weight Bearing Restrictions: No       Mobility Bed Mobility Overal bed mobility: Needs Assistance Bed Mobility: Rolling;Sidelying to Sit;Sit to Sidelying Rolling: Max assist;+2 for physical assistance Sidelying to sit: Max assist;+2 for physical assistance     Sit to sidelying: Total assist;+2 for physical assistance General bed mobility comments: max-total +2 for supine<>sit for trunk and LE management, scooting to/from EOB, righting balance.    Transfers Overall transfer level: Needs assistance   Transfers: Lateral/Scoot Transfers          Lateral/Scoot Transfers: Max assist;+2 physical assistance General transfer comment: Pt requiring  maxA+2 for scooting to [facility] and unable to assist    Balance Overall balance assessment: Needs assistance Sitting-balance support: Feet supported Sitting balance-Leahy Scale: Poor Sitting balance - Comments: posterior bias, cues for anterior trunk translation Postural control: Posterior lean   Standing balance-Leahy Scale: Zero Standing balance comment: Deferred this session for safety and for high BP reading                           ADL either performed or assessed with clinical judgement   ADL Overall ADL's : Needs assistance/impaired                                     Functional mobility during ADLs: Maximal assistance;+2 for physical assistance;+2 for safety/equipment;Rolling walker;Cueing for safety;Cueing for sequencing General ADL Comments: limited session d/t elevated BP (SBP goals of >160) 177/66, session completed from bed level to sitting EOB. RN giving PRN meds.     Vision   Vision Assessment?: Yes;No apparent visual deficits   Perception     Praxis      Cognition Arousal/Alertness: Awake/alert Behavior During Therapy: WFL for tasks assessed/performed Overall Cognitive Status: No family/caregiver present to determine baseline cognitive functioning Area of Impairment: Problem solving                         Safety/Judgement: Decreased awareness of safety   Problem Solving: Slow processing;Decreased initiation;Requires verbal cues;Requires tactile cues General Comments: Pt stating "the bac  of my head hurts," but when asked to describe, pt was unable and stated "It doesn't hurt now."        Exercises     Shoulder Instructions       General Comments high BP readings; RN alerted and brought in meds.    Pertinent Vitals/ Pain       Pain Assessment: 0-10 Pain Score: 3  Faces Pain Scale: Hurts a little bit Pain Location: posterior head Pain Descriptors / Indicators: Sore;Headache Pain Intervention(s): Monitored during  session;Repositioned  Home Living                                          Prior Functioning/Environment              Frequency  Min 2X/week        Progress Toward Goals  OT Goals(current goals can now be found in the care plan section)  Progress towards OT goals: Progressing toward goals  Acute Rehab OT Goals Patient Stated Goal: to feel better OT Goal Formulation: With patient Time For Goal Achievement: 10/09/20 Potential to Achieve Goals: Good ADL Goals Pt Will Perform Grooming: with min guard assist;standing Pt Will Perform Lower Body Dressing: with min assist;with adaptive equipment;sitting/lateral leans;sit to/from stand Pt Will Transfer to Toilet: with min assist;with +2 assist;stand pivot transfer;bedside commode Additional ADL Goal #1: Pt will increase to minA for bed mobility as precursor for ADL Additional ADL Goal #2: Pt will perform OOB ADL tasks with minA displaying good safety awareness.  Plan Discharge plan remains appropriate;Frequency remains appropriate    Co-evaluation    PT/OT/SLP Co-Evaluation/Treatment: Yes Reason for Co-Treatment: Complexity of the patient's impairments (multi-system involvement);To address functional/ADL transfers   OT goals addressed during session: ADL's and self-care;Strengthening/ROM      AM-PAC OT "6 Clicks" Daily Activity     Outcome Measure   Help from another person eating meals?: None Help from another person taking care of personal grooming?: A Little Help from another person toileting, which includes using toliet, bedpan, or urinal?: A Lot Help from another person bathing (including washing, rinsing, drying)?: A Lot Help from another person to put on and taking off regular upper body clothing?: A Little Help from another person to put on and taking off regular lower body clothing?: Total 6 Click Score: 15    End of Session    OT Visit Diagnosis: Unsteadiness on feet (R26.81)   Activity  Tolerance Treatment limited secondary to medical complications (Comment)   Patient Left in bed;with call bell/phone within reach;with bed alarm set   Nurse Communication Mobility status        Time: 1540-1606 OT Time Calculation (min): 26 min  Charges: OT General Charges $OT Visit: 1 Visit OT Treatments $Neuromuscular Re-education: 8-22 mins  Jefferey Pica, OTR/L Acute Rehabilitation Services Pager: [phone] Office: [phone]    Etheleen Valtierra C 09/30/2020, 5:46 PM

## 2020-09-30 NOTE — Discharge Summary (Signed)
Physician Discharge Summary  LILYANNAH BULLINS [id number] DOB: May 04, 1935 DOA: 09/24/2020  PCP: [name], MD  Admit date: 09/24/2020 Discharge date: 09/30/2020  Admitted From: Home Disposition: CIR   Recommendations for Outpatient Follow-up:  1. Follow up with neurology post discharge 2. Continue titration of insulin and diabetes education, follow up with endocrinology recommended. 3. Continue monitoring and management of HTN. Goal SBP <173mHg.  4. Monitor volume status. Continue home medications at discharge.  Home Health: Per CIR Equipment/Devices: Per CIR Discharge Condition: Stable CODE STATUS: Full Diet recommendation: Heart healthy, carb-modified  Brief/Interim Summary: KKINSLEIGH DEERYis an 86y.o. female with a history of chronic AFib on eliquis, CVA with right hemiparesis, HTN, HFpEF, stage III CKD, IDT2DM who presented with a headache due to an acute right pontine hemorrhage in the setting of anticoagulation and hypertension. She was found to have pulmonary edema started on CXR, found also to be hyperglycemic requiring insulin infusion and with AKI improved with IVF. Neurology admitted the patient who was started on clevidipine infusion with improvement in BP. Andexxa was given to reverse eliquis. Ultimately transferred out of ICU with plans to admit to CIR.   Discharge Diagnoses:  Active Problems:   ICH (intracerebral hemorrhage) (HCC)  ICH with small SAH:  - Continue rehabilitation at CIR.  - No anticoagulation/antiplatelets or statin (home med) for now per neuro.  - Needs neurology follow up post discharge  Uncontrolled IDT2DM with hyperglycemia: HbA1c 9.9%. Initially requiring insulin infusion.  - Have been increasing insulins incrementally with improvement. Would recommend again increasing near levemir 40u BID and mealtime novolog 6u + resistant SSI. This is near home dosing though CBGs here are improved compared to chronic levels. - DM coordinator following, pt to  follow up with endocrinology and RD following admission.   HTN: No longer requiring clevidipine gtt. - Continue home hydralazine, increased to '100mg'$  q8h, continue imdur. Continue norvasc '10mg'$  as started here. Goal SBP per neurology is 1635mg or less.   Paroxysmal atrial fibrillation: Currently in NSR. - Holding eliquis  Acute hypoxic respiratory failure due to acute on chronic HFpEF: LVEF 55-60%, G2DD. - s/p diuresis. Appears roughly euvolemic. Can restart home demadex.  AKI on stage IIIb CKD: Improved, may be near baseline with SCr 1.7-1.8.  - Avoid contrast/nephrotoxins.  - Monitor UOP/volume status  Obesity: Estimated body mass index is 30.41 kg/m   Discharge Instructions Discharge Instructions    Ambulatory referral to Neurology   Complete by: As directed    [facility] Too complicated for RN to follow. Thanks.     Allergies as of 09/30/2020   No Known Allergies     Medication List    STOP taking these medications   apixaban 2.5 MG Tabs tablet Commonly known as: ELIQUIS   ascorbic acid 500 MG tablet Commonly known as: VITAMIN C   dexamethasone 6 MG tablet Commonly known as: DECADRON   famotidine 20 MG tablet Commonly known as: PEPCID   guaiFENesin-dextromethorphan 100-10 MG/5ML syrup Commonly known as: ROBITUSSIN DM   potassium chloride SA 20 MEQ tablet Commonly known as: KLOR-CON   simvastatin 20 MG tablet Commonly known as: ZOCOR   zinc sulfate 220 (50 Zn) MG capsule     TAKE these medications   acetaminophen 325 MG tablet Commonly known as: TYLENOL Take 2 tablets (650 mg total) by mouth every 6 (six) hours as needed for mild pain, fever or headache (or Fever >/= 101).   albuterol  108 (90 Base) MCG/ACT inhaler Commonly known as: VENTOLIN HFA Inhale 2 puffs into the lungs every 6 (six) hours as needed for wheezing or shortness of breath.   amLODipine 10 MG tablet Commonly known as: NORVASC Take 1 tablet (10  mg total) by mouth daily.   gabapentin 300 MG capsule Commonly known as: NEURONTIN Take 300 mg by mouth 2 (two) times daily.   hydrALAZINE 100 MG tablet Commonly known as: APRESOLINE Take 1 tablet (100 mg total) by mouth 3 (three) times daily. What changed:   medication strength  how much to take  when to take this   hydrocortisone 2.5 % rectal cream Commonly known as: ANUSOL-HC Place 1 application rectally 2 (two) times daily as needed for hemorrhoids or anal itching.   insulin aspart 100 UNIT/ML FlexPen Commonly known as: NOVOLOG Inject 0-10 Units into the skin 3 (three) times daily with meals. insulin aspart (novoLOG) injection 0-10 Units 0-10 Units Subcutaneous, 3 times daily with meals CBG < 70: Implement Hypoglycemia Standing Orders and refer to Hypoglycemia Standing Orders sidebar report  CBG 70 - 120: 0 unit CBG 121 - 150: 0 unit  CBG 151 - 200: 1 unit CBG 201 - 250: 2 units CBG 251 - 300: 4 units CBG 301 - 350: 6 units  CBG 351 - 400: 8 units  CBG > 400: 10 units   insulin detemir 100 UNIT/ML injection Commonly known as: LEVEMIR Inject 0.4 mLs (40 Units total) into the skin 2 (two) times daily. What changed:   how much to take  when to take this   isosorbide mononitrate 60 MG 24 hr tablet Commonly known as: IMDUR Take 1 tablet (60 mg total) by mouth daily.   ondansetron 4 MG tablet Commonly known as: ZOFRAN Take 4 mg by mouth every 6 (six) hours as needed for nausea or vomiting.   pantoprazole 40 MG tablet Commonly known as: PROTONIX Take 1 tablet by mouth daily.   torsemide 20 MG tablet Commonly known as: DEMADEX Take 1 tablet (20 mg total) by mouth daily.       Follow-up Information    Garvin Fila, MD. Schedule an appointment as soon as possible for a visit in 4 week(s).   Specialties: Neurology, Radiology Contact information: [address] [place] [address] [phone]              No Known  Allergies  Consultations:  PCCM  Neurology  Procedures/Studies: CT Angio Head W or Wo Contrast  Result Date: 09/24/2020 CLINICAL DATA:  Initial evaluation for acute intracranial hemorrhage. EXAM: CT ANGIOGRAPHY HEAD AND NECK TECHNIQUE: Multidetector CT imaging of the head and neck was performed using the standard protocol during bolus administration of intravenous contrast. Multiplanar CT image reconstructions and MIPs were obtained to evaluate the vascular anatomy. Carotid stenosis measurements (when applicable) are obtained utilizing NASCET criteria, using the distal internal carotid diameter as the denominator. CONTRAST:  101m OMNIPAQUE IOHEXOL 350 MG/ML SOLN COMPARISON:  Prior head CT from earlier the same day. FINDINGS: CTA NECK FINDINGS Aortic arch: Visualized aortic arch normal in caliber. Origin of the great vessels incompletely assessed on this exam. Atheromatous change about the origin of the left subclavian artery without significant stenosis. Right carotid system: Right CCA patent from its origin to the bifurcation without stenosis. Scattered calcified plaque about the right carotid bulb without significant stenosis. Right ICA patent distally without stenosis, dissection or occlusion. Left carotid system: Origin of the left CCA not visualized. Visualized left  CCA patent to the bifurcation without stenosis. Eccentric calcified plaque at the left carotid bulb without significant stenosis. Left ICA patent distally without stenosis, dissection or occlusion. Vertebral arteries: Both vertebral arteries arise from subclavian arteries. Right vertebral artery dominant. Vertebral arteries patent within the neck without stenosis, dissection or occlusion. Skeleton: No visible acute osseous abnormality. No discrete or worrisome osseous lesions. 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 Other neck: No other visible acute soft tissue abnormality within the neck. Enlarged multinodular goiter,  with dominant 2.6 cm right thyroid nodule. This has been evaluated on previous imaging in 2013. (ref: J Am Coll Radiol. 2015 Feb;12(2): 143-50). No other mass or adenopathy. Upper chest: Diffuse interlobular septal thickening seen within the visualized lungs, suggesting pulmonary interstitial edema. Visualized upper chest demonstrates no other acute finding. Review of the MIP images confirms the above findings CTA HEAD FINDINGS Anterior circulation: Petrous segments patent bilaterally. Extensive atheromatous change throughout the carotid siphons with associated moderate to advanced diffuse narrowing. A1 segments patent bilaterally. Normal anterior communicating artery complex. Anterior cerebral arteries patent to their distal aspects without stenosis. No M1 stenosis or occlusion. Normal MCA bifurcations. Distal MCA branches well perfused and symmetric. Posterior circulation: Atheromatous change within the mid V4 segments bilaterally with associated moderate stenoses. Left PICA origin patent and normal. Right PICA not seen. Basilar patent to its distal aspect without stenosis. Superior cerebellar arteries patent bilaterally. Left PCA supplied via the basilar. Predominant fetal type origin of the right PCA. PCAs patent to their distal aspects without stenosis. Venous sinuses: Patent allowing for timing the contrast bolus. Anatomic variants: Predominant fetal type origin of the right PCA. No intracranial aneurysm. No vascular abnormality seen underlying the hemorrhages at the pons and cerebellum. Review of the MIP images confirms the above findings IMPRESSION: 1. Negative CTA for large vessel occlusion. No vascular abnormality seen underlying the hemorrhages at the pons and cerebellum. 2. Extensive atheromatous change throughout the carotid siphons with associated moderate to advanced diffuse narrowing. 3. Atheromatous change about the V4 segments bilaterally with associated moderate stenoses. 4. Diffuse interlobular  septal thickening within the visualized lungs, suggesting pulmonary interstitial edema. Electronically Signed   By: Jeannine Boga M.D.   On: 09/24/2020 21:57   CT Head Wo Contrast  Addendum Date: 09/24/2020   ADDENDUM REPORT: 09/24/2020 20:39 ADDENDUM: The patient was subsequently sedated and return for repeat imaging which demonstrates evidence of a parenchymal hemorrhage in the right half of the pons with suggestion of local extension into the prepontine cistern. The density seen in the midline posteriorly may represent some dependent spread of hemorrhage although some artifact does remain. Critical Value/emergent results were called by telephone at the time of interpretation on 09/24/2020 at 8:39 pm to Dr. Varney Biles , who verbally acknowledged these results. Electronically Signed   By: Inez Catalina M.D.   On: 09/24/2020 20:39   Result Date: 09/24/2020 CLINICAL DATA:  Headaches, no known injury, initial encounter EXAM: CT HEAD WITHOUT CONTRAST TECHNIQUE: Contiguous axial images were obtained from the base of the skull through the vertex without intravenous contrast. COMPARISON:  05/02/2008 FINDINGS: Brain: Images are significantly limited by patient motion artifact. Mild atrophic changes and chronic white matter ischemic changes are seen. There are areas of increased attenuation anterior to the pons as well as posteriorly in the midline in the cerebellum. These are likely related to artifact although the possibility of underlying hemorrhage could not be totally excluded on this exam. Repeat imaging when the patient  can better tolerate the exam is recommended. Vascular: No hyperdense vessel or unexpected calcification. Skull: Normal. Negative for fracture or focal lesion. Sinuses/Orbits: No acute finding. Other: None. IMPRESSION: Significantly limited exam. There are findings suspicious for hemorrhage in the posterior fossa and given the clinical history repeat imaging with sedation is recommended  to allow the patient to better tolerate the procedure. No other focal abnormality is noted. Electronically Signed: By: Inez Catalina M.D. On: 09/24/2020 19:55   CT Angio Neck W and/or Wo Contrast  Result Date: 09/24/2020 CLINICAL DATA:  Initial evaluation for acute intracranial hemorrhage. EXAM: CT ANGIOGRAPHY HEAD AND NECK TECHNIQUE: Multidetector CT imaging of the head and neck was performed using the standard protocol during bolus administration of intravenous contrast. Multiplanar CT image reconstructions and MIPs were obtained to evaluate the vascular anatomy. Carotid stenosis measurements (when applicable) are obtained utilizing NASCET criteria, using the distal internal carotid diameter as the denominator. CONTRAST:  48m OMNIPAQUE IOHEXOL 350 MG/ML SOLN COMPARISON:  Prior head CT from earlier the same day. FINDINGS: CTA NECK FINDINGS Aortic arch: Visualized aortic arch normal in caliber. Origin of the great vessels incompletely assessed on this exam. Atheromatous change about the origin of the left subclavian artery without significant stenosis. Right carotid system: Right CCA patent from its origin to the bifurcation without stenosis. Scattered calcified plaque about the right carotid bulb without significant stenosis. Right ICA patent distally without stenosis, dissection or occlusion. Left carotid system: Origin of the left CCA not visualized. Visualized left CCA patent to the bifurcation without stenosis. Eccentric calcified plaque at the left carotid bulb without significant stenosis. Left ICA patent distally without stenosis, dissection or occlusion. Vertebral arteries: Both vertebral arteries arise from subclavian arteries. Right vertebral artery dominant. Vertebral arteries patent within the neck without stenosis, dissection or occlusion. Skeleton: No visible acute osseous abnormality. No discrete or worrisome osseous lesions. 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 Other neck:  No other visible acute soft tissue abnormality within the neck. Enlarged multinodular goiter, with dominant 2.6 cm right thyroid nodule. This has been evaluated on previous imaging in 2013. (ref: J Am Coll Radiol. 2015 Feb;12(2): 143-50). No other mass or adenopathy. Upper chest: Diffuse interlobular septal thickening seen within the visualized lungs, suggesting pulmonary interstitial edema. Visualized upper chest demonstrates no other acute finding. Review of the MIP images confirms the above findings CTA HEAD FINDINGS Anterior circulation: Petrous segments patent bilaterally. Extensive atheromatous change throughout the carotid siphons with associated moderate to advanced diffuse narrowing. A1 segments patent bilaterally. Normal anterior communicating artery complex. Anterior cerebral arteries patent to their distal aspects without stenosis. No M1 stenosis or occlusion. Normal MCA bifurcations. Distal MCA branches well perfused and symmetric. Posterior circulation: Atheromatous change within the mid V4 segments bilaterally with associated moderate stenoses. Left PICA origin patent and normal. Right PICA not seen. Basilar patent to its distal aspect without stenosis. Superior cerebellar arteries patent bilaterally. Left PCA supplied via the basilar. Predominant fetal type origin of the right PCA. PCAs patent to their distal aspects without stenosis. Venous sinuses: Patent allowing for timing the contrast bolus. Anatomic variants: Predominant fetal type origin of the right PCA. No intracranial aneurysm. No vascular abnormality seen underlying the hemorrhages at the pons and cerebellum. Review of the MIP images confirms the above findings IMPRESSION: 1. Negative CTA for large vessel occlusion. No vascular abnormality seen underlying the hemorrhages at the pons and cerebellum. 2. Extensive atheromatous change throughout the carotid siphons with associated moderate to  advanced diffuse narrowing. 3. Atheromatous change  about the V4 segments bilaterally with associated moderate stenoses. 4. Diffuse interlobular septal thickening within the visualized lungs, suggesting pulmonary interstitial edema. Electronically Signed   By: Jeannine Boga M.D.   On: 09/24/2020 21:57   MR BRAIN WO CONTRAST  Result Date: 09/25/2020 CLINICAL DATA:  Previous stroke. Chronic atrial fibrillation. Right hemiparesis. New onset headache. Intracranial hemorrhage. EXAM: MRI HEAD WITHOUT CONTRAST TECHNIQUE: Multiplanar, multiecho pulse sequences of the brain and surrounding structures were obtained without intravenous contrast. COMPARISON:  CT studies yesterday. FINDINGS: Brain: Acute intraparenchymal hemorrhage within the right side of the pons is redemonstrated without appreciable change. Acute hemorrhage in the inferior cerebellar vermis is redemonstrated without appreciable change. Chronic small-vessel ischemic changes are present elsewhere throughout the pons. Few other old small vessel cerebellar infarctions. Cerebral hemispheres show chronic small-vessel disease throughout the thalami and hemispheric white matter. Few old basal ganglia infarctions. No large vessel territory infarction. Numerous scattered punctate foci of hemosiderin deposition related to some of the old small vessel infarctions. Small amount of blood dependent within the occipital horns of the lateral ventricles and in the perimesencephalic cistern. No hydrocephalus. No sign of mass lesion. Vascular: Major vessels at the base of the brain show flow. Skull and upper cervical spine: Negative Sinuses/Orbits: Clear/normal Other: None IMPRESSION: Redemonstration of acute intraparenchymal hemorrhage within the right side of the pons and within the inferior cerebellar vermis. Small amount of subarachnoid blood in the perimesencephalic cistern, interfoliar subarachnoid spaces of the posterior fossa and dependent within the occipital horns of the lateral ventricles. No evidence of  increasing or new hemorrhage. No hydrocephalus. Extensive chronic small-vessel ischemic changes elsewhere throughout the brain, many with punctate hemosiderin deposition. Overall findings most consistent with hypertensive disease. Electronically Signed   By: Nelson Chimes M.D.   On: 09/25/2020 07:33   DG Chest Port 1 View  Result Date: 09/24/2020 CLINICAL DATA:  Shortness of breath cough EXAM: PORTABLE CHEST 1 VIEW COMPARISON:  02/10/2020 FINDINGS: Cardiac shadow remains enlarged. Mild vascular congestion is noted although improved when compared with the prior exam. No sizable effusion is noted. No pneumothorax is seen. No bony abnormality is noted. IMPRESSION: Mild CHF although improved from the prior study. Electronically Signed   By: Inez Catalina M.D.   On: 09/24/2020 18:49   ECHOCARDIOGRAM COMPLETE  Result Date: 09/25/2020    ECHOCARDIOGRAM REPORT   Patient Name:   TENESHIA MARIK Date of Exam: 09/25/2020 Medical Rec #:  [id number]    Height:       68.0 in Accession #:    [id number]   Weight:       200.0 lb Date of Birth:  1934-11-27     BSA:          2.044 m Patient Age:    17 years     BP:           118/43 mmHg Patient Gender: F            HR:           76 bpm. Exam Location:  Inpatient Procedure: 2D Echo Indications:    stroke  History:        Patient has prior history of Echocardiogram examinations, most                 recent 06/29/2019. CHF, Arrythmias:Atrial Fibrillation; Risk                 Factors:Diabetes and Hypertension.  Sonographer:  Johny Chess Referring Phys: [phone] [place]  Sonographer Comments: Image acquisition challenging due to patient body habitus. IMPRESSIONS  1. Left ventricular ejection fraction, by estimation, is 55 to 60%. The left ventricle has normal function. The left ventricle has no regional wall motion abnormalities. Left ventricular diastolic parameters are consistent with Grade II diastolic dysfunction (pseudonormalization). Elevated left atrial pressure.   2. Right ventricular systolic function is normal. The right ventricular size is normal.  3. The mitral valve is normal in structure. Trivial mitral valve regurgitation. No evidence of mitral stenosis.  4. The aortic valve has an indeterminant number of cusps. Aortic valve regurgitation is not visualized. No aortic stenosis is present.  5. The inferior vena cava is normal in size with greater than 50% respiratory variability, suggesting right atrial pressure of 3 mmHg. FINDINGS  Left Ventricle: Left ventricular ejection fraction, by estimation, is 55 to 60%. The left ventricle has normal function. The left ventricle has no regional wall motion abnormalities. The left ventricular internal cavity size was normal in size. There is  no left ventricular hypertrophy. Left ventricular diastolic parameters are consistent with Grade II diastolic dysfunction (pseudonormalization). Elevated left atrial pressure. Right Ventricle: The right ventricular size is normal.Right ventricular systolic function is normal. Left Atrium: Left atrial size was normal in size. Right Atrium: Right atrial size was normal in size. Pericardium: There is no evidence of pericardial effusion. Mitral Valve: The mitral valve is normal in structure. Mild mitral annular calcification. Trivial mitral valve regurgitation. No evidence of mitral valve stenosis. Tricuspid Valve: The tricuspid valve is normal in structure. Tricuspid valve regurgitation is trivial. No evidence of tricuspid stenosis. Aortic Valve: The aortic valve has an indeterminant number of cusps. Aortic valve regurgitation is not visualized. No aortic stenosis is present. Pulmonic Valve: The pulmonic valve was not well visualized. Pulmonic valve regurgitation is not visualized. No evidence of pulmonic stenosis. Aorta: The aortic root is normal in size and structure. Venous: The inferior vena cava is normal in size with greater than 50% respiratory variability, suggesting right atrial  pressure of 3 mmHg. IAS/Shunts: No atrial level shunt detected by color flow Doppler.  LEFT VENTRICLE PLAX 2D LVIDd:         4.70 cm  Diastology LVIDs:         3.40 cm  LV e' medial:    4.57 cm/s LV PW:         0.90 cm  LV E/e' medial:  33.3 LV IVS:        1.10 cm  LV e' lateral:   7.18 cm/s LVOT diam:     2.10 cm  LV E/e' lateral: 21.2 LV SV:         73 LV SV Index:   36 LVOT Area:     3.46 cm  RIGHT VENTRICLE             IVC RV S prime:     13.60 cm/s  IVC diam: 2.20 cm TAPSE (M-mode): 1.5 cm LEFT ATRIUM             Index LA diam:        3.90 cm 1.91 cm/m LA Vol (A2C):   51.9 ml 25.39 ml/m LA Vol (A4C):   63.4 ml 31.02 ml/m LA Biplane Vol: 61.0 ml 29.85 ml/m  AORTIC VALVE LVOT Vmax:   106.00 cm/s LVOT Vmean:  72.100 cm/s LVOT VTI:    0.211 m  AORTA Ao Root diam: 2.90 cm MITRAL VALVE MV  Area (PHT): 3.72 cm     SHUNTS MV Decel Time: 204 msec     Systemic VTI:  0.21 m MV E velocity: 152.00 cm/s  Systemic Diam: 2.10 cm MV A velocity: 93.00 cm/s MV E/A ratio:  1.63 Kirk Ruths MD Electronically signed by Kirk Ruths MD Signature Date/Time: 09/25/2020/4:45:44 PM    Final     Subjective: No new issues. Breathing well, eating well. Feels generally better than previously.   Discharge Exam: Vitals:   09/30/20 0407 09/30/20 0802  BP: (!) 164/68 (!) 158/75  Pulse: 82 78  Resp: 18 18  Temp: 98.1 F (36.7 C) 98.4 F (36.9 C)  SpO2: 95% 100%   General: No distress Cardiovascular: RRR Respiratory: Nonlabored, clear Abdominal: NT, ND Extremities: Trace edema, no cyanosis  Labs: BNP (last 3 results) Recent Labs    12/26/19 0818 09/24/20 2137  BNP 116.0* 0000000*   Basic Metabolic Panel: Recent Labs  Lab 09/24/20 2020 09/24/20 2137 09/25/20 0440 09/25/20 0811 09/26/20 0323 09/27/20 0127  NA 145 138 137  --  139 138  K 2.3* 3.9 4.1  --  3.9 3.9  CL 122* 103 101  --  107 107  CO2  --  25 25  --  24 25  GLUCOSE 217* 345* 424*  --  147* 220*  BUN 18 28* 27*  --  29* 25*  CREATININE  0.70 1.58* 1.93*  --  2.16* 1.78*  CALCIUM  --  8.8* 8.5*  --  8.3* 8.6*  MG  --   --   --  2.3  --   --   PHOS  --   --   --   --  3.5  --    Liver Function Tests: Recent Labs  Lab 09/26/20 0323  ALBUMIN 2.8*   No results for input(s): LIPASE, AMYLASE in the last 168 hours. No results for input(s): AMMONIA in the last 168 hours. CBC: Recent Labs  Lab 09/24/20 2020 09/24/20 2137 09/26/20 0323 09/27/20 0127  WBC  --  10.4 8.3 8.0  NEUTROABS  --  7.9*  --   --   HGB 7.1* 12.7 10.6* 10.1*  HCT 21.0* 40.8 32.8* 31.5*  MCV  --  92.7 89.1 89.0  PLT  --  253 213 227   Cardiac Enzymes: No results for input(s): CKTOTAL, CKMB, CKMBINDEX, TROPONINI in the last 168 hours. BNP: Invalid input(s): POCBNP CBG: Recent Labs  Lab 09/29/20 0621 09/29/20 1228 09/29/20 1701 09/29/20 2123 09/30/20 0625  GLUCAP 230* 214* 167* 178* 229*   D-Dimer No results for input(s): DDIMER in the last 72 hours. Hgb A1c No results for input(s): HGBA1C in the last 72 hours. Lipid Profile No results for input(s): CHOL, HDL, LDLCALC, TRIG, CHOLHDL, LDLDIRECT in the last 72 hours. Thyroid function studies No results for input(s): TSH, T4TOTAL, T3FREE, THYROIDAB in the last 72 hours.  Invalid input(s): FREET3 Anemia work up No results for input(s): VITAMINB12, FOLATE, FERRITIN, TIBC, IRON, RETICCTPCT in the last 72 hours. Urinalysis    Component Value Date/Time   COLORURINE YELLOW 09/25/2020 0806   APPEARANCEUR HAZY (A) 09/25/2020 0806   LABSPEC 1.024 09/25/2020 0806   PHURINE 5.0 09/25/2020 0806   GLUCOSEU >=500 (A) 09/25/2020 0806   HGBUR NEGATIVE 09/25/2020 0806   BILIRUBINUR NEGATIVE 09/25/2020 0806   KETONESUR NEGATIVE 09/25/2020 0806   PROTEINUR 100 (A) 09/25/2020 0806   UROBILINOGEN 0.2 02/20/2015 1200   NITRITE NEGATIVE 09/25/2020 0806   LEUKOCYTESUR NEGATIVE 09/25/2020 [id number]  Microbiology Recent Results (from the past 240 hour(s))  Resp Panel by RT-PCR (Flu A&B, Covid)  Nasopharyngeal Swab     Status: None   Collection Time: 09/24/20  8:02 PM   Specimen: Nasopharyngeal Swab; Nasopharyngeal(NP) swabs in vial transport medium  Result Value Ref Range Status   SARS Coronavirus 2 by RT PCR NEGATIVE NEGATIVE Final    Comment: (NOTE) SARS-CoV-2 target nucleic acids are NOT DETECTED.  The SARS-CoV-2 RNA is generally detectable in upper respiratory specimens during the acute phase of infection. The lowest concentration of SARS-CoV-2 viral copies this assay can detect is 138 copies/mL. A negative result does not preclude SARS-Cov-2 infection and should not be used as the sole basis for treatment or other patient management decisions. A negative result may occur with  improper specimen collection/handling, submission of specimen other than nasopharyngeal swab, presence of viral mutation(s) within the areas targeted by this assay, and inadequate number of viral copies(<138 copies/mL). A negative result must be combined with clinical observations, patient history, and epidemiological information. The expected result is Negative.  Fact Sheet for Patients:  EntrepreneurPulse.com.au  Fact Sheet for Healthcare Providers:  IncredibleEmployment.be  This test is no t yet approved or cleared by the [place] FDA and  has been authorized for detection and/or diagnosis of SARS-CoV-2 by FDA under an Emergency Use Authorization (EUA). This EUA will remain  in effect (meaning this test can be used) for the duration of the COVID-19 declaration under Section 564(b)(1) of the Act, 21 U.S.C.section 360bbb-3(b)(1), unless the authorization is terminated  or revoked sooner.       Influenza A by PCR NEGATIVE NEGATIVE Final   Influenza B by PCR NEGATIVE NEGATIVE Final    Comment: (NOTE) The Xpert Xpress SARS-CoV-2/FLU/RSV plus assay is intended as an aid in the diagnosis of influenza from Nasopharyngeal swab specimens and should not be  used as a sole basis for treatment. Nasal washings and aspirates are unacceptable for Xpert Xpress SARS-CoV-2/FLU/RSV testing.  Fact Sheet for Patients: EntrepreneurPulse.com.au  Fact Sheet for Healthcare Providers: IncredibleEmployment.be  This test is not yet approved or cleared by the [place] FDA and has been authorized for detection and/or diagnosis of SARS-CoV-2 by FDA under an Emergency Use Authorization (EUA). This EUA will remain in effect (meaning this test can be used) for the duration of the COVID-19 declaration under Section 564(b)(1) of the Act, 21 U.S.C. section 360bbb-3(b)(1), unless the authorization is terminated or revoked.  Performed at [facility], [address] [place], [place] [address]   MRSA PCR Screening     Status: None   Collection Time: 09/25/20 12:38 AM   Specimen: Nasal Mucosa; Nasopharyngeal  Result Value Ref Range Status   MRSA by PCR NEGATIVE NEGATIVE Final    Comment:        The GeneXpert MRSA Assay (FDA approved for NASAL specimens only), is one component of a comprehensive MRSA colonization surveillance program. It is not intended to diagnose MRSA infection nor to guide or monitor treatment for MRSA infections. Performed at [facility], [place] [address] [place], [place] [address]     Time coordinating discharge: Approximately 40 minutes  Patrecia Pour, MD  [facility] 09/30/2020, 9:37 AM

## 2020-09-30 NOTE — Progress Notes (Signed)
Physical Medicine and Rehabilitation Consult Reason for Consult: Headache with slurred speech Referring Physician: Dr. Leonel Ramsay     HPI: [name] is a 86 y.o. right-handed female with history of previous stroke with right hemiparesis, chronic atrial fibrillation maintained on Eliquis, diabetes mellitus, diastolic congestive heart failure, CKD stage III.  Per chart review patient lives with her children.  1 level home ramped entrance.  Use a rolling walker prior to admission as well as needing assistance with ADLs.  Presented 09/24/2020 with acute onset of headache as well as slurred speech.  Denied any double vision.  While in the ED noted some shortness of breath pulmonary edema noted on chest x-ray BiPAP initiated.  Cranial CT scan suspicious for hemorrhage in the posterior fossa.  CT angiogram of head and neck negative for large vessel occlusion.  No vascular abnormalities.  MRI of the brain redemonstrates acute intraparenchymal hemorrhage within the right side of the pons and within the inferior cerebellar vermis.  Small amount of subarachnoid blood in the perimesencephalic cistern, interfoliar subarachnoid spaces of the posterior fossa independent within the occipital horns of the lateral ventricles.  No hydrocephalus.  Admission chemistries unremarkable except potassium 2.3, glucose 217, creatinine 1.58, urine drug screen negative.  Echocardiogram with ejection fraction of 55 to [id number] grade 2 diastolic dysfunction..  Maintained on Cleviprex for blood pressure control.  Patient did receive Andexxa to reverse Eliquis.  Maintained on a regular consistency diet.  Due to patient decreased functional ability recommendations of physical medicine rehab consult.     Review of Systems  Constitutional: Negative for chills and fever.  HENT: Negative for hearing loss.   Eyes: Negative for blurred vision and double vision.  Respiratory: Negative for shortness of breath.   Cardiovascular:  Negative for chest pain and palpitations.  Gastrointestinal: Positive for constipation. Negative for heartburn and nausea.       GERD  Genitourinary: Negative for dysuria and flank pain.  Musculoskeletal: Positive for back pain and myalgias.  Skin: Negative for rash.  Neurological: Positive for speech change and headaches.  All other systems reviewed and are negative.       Past Medical History:  Diagnosis Date  . Arthritis    . CHF (congestive heart failure) ([place])    . Chronic diastolic heart failure ([place])    . CKD (chronic kidney disease) stage 3, GFR 30-59 ml/min ([facility])    . Essential hypertension    . GERD (gastroesophageal reflux disease)    . Hemorrhoids    . History of stroke    . Type 2 diabetes mellitus ([place])           Past Surgical History:  Procedure Laterality Date  . BACK SURGERY      . CATARACT EXTRACTION W/PHACO Left 02/28/2013    Procedure: CATARACT EXTRACTION PHACO AND INTRAOCULAR LENS PLACEMENT (IOL) CDE=15.60;  Surgeon: Elta Guadeloupe T. Gershon Crane, MD;  Location: [facility];  Service: Ophthalmology;  Laterality: Left;  . CHOLECYSTECTOMY      . COLONOSCOPY   12/12/2003    RMR: Normal rectum.  Long capacious tortuous colon, colonic mucosa appeared normal  . COLONOSCOPY N/A 03/21/2014    Dr. [name]: internal and external hemorrhoids, torturous colon, colonic diverticulosis   . ESOPHAGOGASTRODUODENOSCOPY   12/28/2001    [id number] sliding hiatal hernia/. Three small bulbar ulcers, two with stigmata of bleeding and these were coagulated using heater probe unit   . ESOPHAGOGASTRODUODENOSCOPY N/A 03/21/2014    Dr.  Rourk: cervical esophageal web s/p dilation, negative H.pylori   . EYE SURGERY      . HIP FRACTURE SURGERY   2008  . JOINT REPLACEMENT        Rt hip, ????? sounds like just for fracture  . MALONEY DILATION N/A 03/21/2014    Procedure: [name] DILATION;  Surgeon: [name], MD;  Location: [facility];  Service: Endoscopy;  Laterality: N/A;  . SAVORY DILATION N/A  03/21/2014    Procedure: SAVORY DILATION;  Surgeon: [name], MD;  Location: [facility];  Service: Endoscopy;  Laterality: N/A;         Family History  Problem Relation Age of Onset  . Crohn's disease Daughter    . Hypertension Mother    . Hypertension Sister    . Diabetes Brother    . Diabetes Sister    . Diabetes Sister    . Heart attack Sister    . Colon cancer Neg Hx      Social History:  reports that she has never smoked. She has never used smokeless tobacco. She reports that she does not drink alcohol and does not use drugs. Allergies: No Known Allergies       Medications Prior to Admission  Medication Sig Dispense Refill  . acetaminophen (TYLENOL) 325 MG tablet Take 2 tablets (650 mg total) by mouth every 6 (six) hours as needed for mild pain, fever or headache (or Fever >/= 101). 12 tablet 0  . albuterol (VENTOLIN HFA) 108 (90 Base) MCG/ACT inhaler Inhale 2 puffs into the lungs every 6 (six) hours as needed for wheezing or shortness of breath. 18 g 0  . apixaban (ELIQUIS) 2.5 MG TABS tablet Take 1 tablet (2.5 mg total) by mouth 2 (two) times daily. 60 tablet 2  . ascorbic acid (VITAMIN C) 500 MG tablet Take 1 tablet (500 mg total) by mouth daily. 30 tablet 1  . dexamethasone (DECADRON) 6 MG tablet Take 1 tablet (6 mg total) by mouth daily with breakfast. 5 tablet 0  . famotidine (PEPCID) 20 MG tablet Take 1 tablet (20 mg total) by mouth daily.      Marland Kitchen gabapentin (NEURONTIN) 300 MG capsule Take 300 mg by mouth 2 (two) times daily.       Marland Kitchen guaiFENesin-dextromethorphan (ROBITUSSIN DM) 100-10 MG/5ML syrup Take 10 mLs by mouth every 4 (four) hours as needed for cough. 118 mL 0  . hydrALAZINE (APRESOLINE) 50 MG tablet Take 1.5 tablets (75 mg total) by mouth 2 (two) times daily. 90 tablet 11  . hydrocortisone (ANUSOL-HC) 2.5 % rectal cream Place 1 application rectally 2 (two) times daily as needed for hemorrhoids or anal itching.      . insulin aspart (NOVOLOG) 100 UNIT/ML  FlexPen Inject 0-10 Units into the skin 3 (three) times daily with meals. insulin aspart (novoLOG) injection 0-10 Units 0-10 Units Subcutaneous, 3 times daily with meals CBG < 70: Implement Hypoglycemia Standing Orders and refer to Hypoglycemia Standing Orders sidebar report  CBG 70 - 120: 0 unit CBG 121 - 150: 0 unit  CBG 151 - 200: 1 unit CBG 201 - 250: 2 units CBG 251 - 300: 4 units CBG 301 - 350: 6 units  CBG 351 - 400: 8 units  CBG > 400: 10 units 15 mL 11  . insulin detemir (LEVEMIR) 100 UNIT/ML injection Inject 0.8 mLs (80 Units total) into the skin at bedtime. 10 mL 2  . isosorbide mononitrate (IMDUR) 60 MG 24 hr  tablet Take 1 tablet (60 mg total) by mouth daily.      . ondansetron (ZOFRAN) 4 MG tablet Take 4 mg by mouth every 6 (six) hours as needed for nausea or vomiting.      . potassium chloride SA (KLOR-CON) 20 MEQ tablet Take 1 tablet (20 mEq total) by mouth daily. 90 tablet 0  . simvastatin (ZOCOR) 20 MG tablet Take 20 mg by mouth at bedtime.      . torsemide (DEMADEX) 20 MG tablet Take 1 tablet (20 mg total) by mouth daily. 30 tablet 3  . zinc sulfate 220 (50 Zn) MG capsule Take 1 capsule (220 mg total) by mouth daily. 30 capsule 0      Home: Home Living Family/patient expects to be discharged to:: Private residence Living Arrangements: Children Available Help at Discharge: Family,Available 24 hours/day Type of Home: House Home Access: Ramped entrance Home Layout: One level Bathroom Shower/Tub: Multimedia programmer: Handicapped height Bathroom Accessibility: Yes Home Equipment: Environmental consultant - 2 wheels,Bedside commode,Wheelchair - manual,Shower seat,Toilet riser,Tub bench,Hospital bed (states she needs a new w/c) Additional Comments: sleeps in recliner, gets up from [facility] elevated position  Functional History: Prior Function Level of Independence: Needs assistance Gait / Transfers Assistance Needed: RW; very old w/c ADL's / [place] Needed: ADL by pt; iADL  from family; cooking, Functional Status:  Mobility: Bed Mobility Overal bed mobility: Needs Assistance Bed Mobility: Supine to Sit,Sit to Supine Supine to sit: Max assist,+2 for physical assistance Sit to supine: Max assist,+2 for physical assistance General bed mobility comments: Pt able to move BLE's minimally, ultimately requiring assist to progress off edge of bed and trunk to upright. Use of bed pad to scoot hips forward. Assist for trunk guidance and BLE elevation back into bed Transfers Overall transfer level: Needs assistance Equipment used: Rolling walker (2 wheeled) Transfers: Sit to/from Stand Sit to Stand: Max assist,+2 physical assistance General transfer comment: maxA + 2 to stand from edge of bed x 3. On first trial, utilized walker, but pt with difficulty transitioning hands to handles. Attempted face to face transfer for second and third trial. Pt able to achieve hip clearance, but signficant trunk flexion and could not achieve upright posture   ADL:   Cognition: Cognition Overall Cognitive Status: Impaired/Different from baseline Orientation Level: Oriented X4 Cognition Arousal/Alertness: Awake/alert Behavior During Therapy: WFL for tasks assessed/performed Overall Cognitive Status: Impaired/Different from baseline Area of Impairment: Safety/judgement,Awareness Safety/Judgement: Decreased awareness of deficits Awareness: Emergent General Comments: Pt thinks it's due to lines attached to her that she is not able to stand   Blood pressure (!) 118/43, pulse 75, temperature 99.7 F (37.6 C), temperature source Oral, resp. rate (!) 23, height '5\' 8"'$  (1.727 m), weight 90.7 kg, SpO2 98 %. Physical Exam Constitutional:      Appearance: She is obese.  HENT:     Head: Normocephalic.     Right Ear: External ear normal.     Left Ear: External ear normal.     Mouth/Throat:     Mouth: Mucous membranes are moist.  Eyes:     Extraocular Movements: Extraocular movements  intact.     Pupils: Pupils are equal, round, and reactive to light.  Cardiovascular:     Rate and Rhythm: Normal rate.  Pulmonary:     Effort: Pulmonary effort is normal.  Musculoskeletal:     Cervical back: Normal range of motion.     Comments: Right heel cord contracture  Skin:    General:  Skin is warm.  Neurological:     Mental Status: She is alert.     Comments: Patient is awake alert no acute distress.  Makes eye contact with examiner.  Provides name and age.  Follows simple commands. Speech sl dysarthric. Reasonable insight and awareness. RUE 4-5/5. LUE 3+ to 4/5 prox to distal. RLE 2/5 prox to distal with trace movement at right ankle,contracture. LLE 3- prox to 3+/5 distally. No focal sensory abn to LT, pain noted on either side. DTR's increased RLE       Lab Results Last 24 Hours        Results for orders placed or performed during the hospital encounter of 09/24/20 (from the past 24 hour(s))  Resp Panel by RT-PCR (Flu A&B, Covid) Nasopharyngeal Swab     Status: None    Collection Time: 09/24/20  8:02 PM    Specimen: Nasopharyngeal Swab; Nasopharyngeal(NP) swabs in vial transport medium  Result Value Ref Range    SARS Coronavirus 2 by RT PCR NEGATIVE NEGATIVE    Influenza A by PCR NEGATIVE NEGATIVE    Influenza B by PCR NEGATIVE NEGATIVE  I-stat chem 8, ED (not at [facility] or [facility])     Status: Abnormal    Collection Time: 09/24/20  8:20 PM  Result Value Ref Range    Sodium 145 135 - 145 mmol/L    Potassium 2.3 (LL) 3.5 - 5.1 mmol/L    Chloride 122 (H) 98 - 111 mmol/L    BUN 18 8 - 23 mg/dL    Creatinine, Ser 0.70 0.44 - 1.00 mg/dL    Glucose, Bld 217 (H) 70 - 99 mg/dL    Calcium, Ion 0.53 (LL) 1.15 - 1.40 mmol/L    TCO2 15 (L) 22 - 32 mmol/L    Hemoglobin 7.1 (L) 12.0 - 15.0 g/dL    HCT 21.0 (L) 36.0 - [id number] %  Basic metabolic panel     Status: Abnormal    Collection Time: 09/24/20  9:37 PM  Result Value Ref Range    Sodium 138 135 - 145 mmol/L    Potassium 3.9 3.5 - 5.1  mmol/L    Chloride 103 98 - 111 mmol/L    CO2 25 22 - 32 mmol/L    Glucose, Bld 345 (H) 70 - 99 mg/dL    BUN 28 (H) 8 - 23 mg/dL    Creatinine, Ser 1.58 (H) 0.44 - 1.00 mg/dL    Calcium 8.8 (L) 8.9 - 10.3 mg/dL    GFR, Estimated 32 (L) >60 mL/min    Anion gap 10 5 - 15  CBC with Differential     Status: Abnormal    Collection Time: 09/24/20  9:37 PM  Result Value Ref Range    WBC 10.4 4.0 - 10.5 K/uL    RBC 4.40 3.87 - 5.11 MIL/uL    Hemoglobin 12.7 12.0 - 15.0 g/dL    HCT 40.8 36.0 - 46.0 %    MCV 92.7 80.0 - 100.0 fL    MCH 28.9 26.0 - 34.0 pg    MCHC 31.1 30.0 - 36.0 g/dL    RDW 15.1 11.5 - 15.5 %    Platelets 253 150 - 400 K/uL    nRBC 0.0 0.0 - 0.2 %    Neutrophils Relative % 76 %    Neutro Abs 7.9 (H) 1.7 - 7.7 K/uL    Lymphocytes Relative 17 %    Lymphs Abs 1.8 0.7 - 4.0 K/uL    Monocytes Relative  5 %    Monocytes Absolute 0.5 0.1 - 1.0 K/uL    Eosinophils Relative 1 %    Eosinophils Absolute 0.1 0.0 - 0.5 K/uL    Basophils Relative 1 %    Basophils Absolute 0.1 0.0 - 0.1 K/uL    Immature Granulocytes 0 %    Abs Immature Granulocytes 0.04 0.00 - 0.07 K/uL  Protime-INR     Status: None    Collection Time: 09/24/20  9:37 PM  Result Value Ref Range    Prothrombin Time 13.7 11.4 - 15.2 seconds    INR 1.1 0.8 - 1.2  Brain natriuretic peptide     Status: Abnormal    Collection Time: 09/24/20  9:37 PM  Result Value Ref Range    B Natriuretic Peptide 101.0 (H) 0.0 - 100.0 pg/mL  MRSA PCR Screening     Status: None    Collection Time: 09/25/20 12:38 AM    Specimen: Nasal Mucosa; Nasopharyngeal  Result Value Ref Range    MRSA by PCR NEGATIVE NEGATIVE  Basic metabolic panel     Status: Abnormal    Collection Time: 09/25/20  4:40 AM  Result Value Ref Range    Sodium 137 135 - 145 mmol/L    Potassium 4.1 3.5 - 5.1 mmol/L    Chloride 101 98 - 111 mmol/L    CO2 25 22 - 32 mmol/L    Glucose, Bld 424 (H) 70 - 99 mg/dL    BUN 27 (H) 8 - 23 mg/dL    Creatinine, Ser 1.93  (H) 0.44 - 1.00 mg/dL    Calcium 8.5 (L) 8.9 - 10.3 mg/dL    GFR, Estimated 25 (L) >60 mL/min    Anion gap 11 5 - 15  Type and screen     Status: None    Collection Time: 09/25/20  4:48 AM  Result Value Ref Range    ABO/RH(D) A POS      Antibody Screen NEG      Sample Expiration          09/28/2020,2359 Performed at [facility], [address], [place], [place] [address]    Glucose, capillary     Status: Abnormal    Collection Time: 09/25/20  7:13 AM  Result Value Ref Range    Glucose-Capillary 446 (H) 70 - 99 mg/dL  Urinalysis, Routine w reflex microscopic Urine, Clean Catch     Status: Abnormal    Collection Time: 09/25/20  8:06 AM  Result Value Ref Range    Color, Urine YELLOW YELLOW    APPearance HAZY (A) CLEAR    Specific Gravity, Urine 1.024 1.005 - 1.030    pH 5.0 5.0 - 8.0    Glucose, UA >=500 (A) NEGATIVE mg/dL    Hgb urine dipstick NEGATIVE NEGATIVE    Bilirubin Urine NEGATIVE NEGATIVE    Ketones, ur NEGATIVE NEGATIVE mg/dL    Protein, ur 100 (A) NEGATIVE mg/dL    Nitrite NEGATIVE NEGATIVE    Leukocytes,Ua NEGATIVE NEGATIVE    RBC / HPF 0-5 0 - 5 RBC/hpf    WBC, UA 11-20 0 - 5 WBC/hpf    Bacteria, UA MANY (A) NONE SEEN    Squamous Epithelial / LPF 0-5 0 - 5    Mucus PRESENT    Magnesium     Status: None    Collection Time: 09/25/20  8:11 AM  Result Value Ref Range    Magnesium 2.3 1.7 - 2.4 mg/dL  Urine rapid drug screen (  hosp performed)     Status: None    Collection Time: 09/25/20  8:22 AM  Result Value Ref Range    Opiates NONE DETECTED NONE DETECTED    Cocaine NONE DETECTED NONE DETECTED    Benzodiazepines NONE DETECTED NONE DETECTED    Amphetamines NONE DETECTED NONE DETECTED    Tetrahydrocannabinol NONE DETECTED NONE DETECTED    Barbiturates NONE DETECTED NONE DETECTED  Glucose, capillary     Status: Abnormal    Collection Time: 09/25/20 10:40 AM  Result Value Ref Range    Glucose-Capillary 431 (H) 70 - 99 mg/dL  Glucose, capillary      Status: Abnormal    Collection Time: 09/25/20 11:31 AM  Result Value Ref Range    Glucose-Capillary 430 (H) 70 - 99 mg/dL  Glucose, capillary     Status: Abnormal    Collection Time: 09/25/20 12:48 PM  Result Value Ref Range    Glucose-Capillary 416 (H) 70 - 99 mg/dL  Glucose, capillary     Status: Abnormal    Collection Time: 09/25/20  1:38 PM  Result Value Ref Range    Glucose-Capillary 378 (H) 70 - 99 mg/dL       Imaging Results (Last 48 hours)  CT Angio Head W or Wo Contrast   Result Date: 09/24/2020 CLINICAL DATA:  Initial evaluation for acute intracranial hemorrhage. EXAM: CT ANGIOGRAPHY HEAD AND NECK TECHNIQUE: Multidetector CT imaging of the head and neck was performed using the standard protocol during bolus administration of intravenous contrast. Multiplanar CT image reconstructions and MIPs were obtained to evaluate the vascular anatomy. Carotid stenosis measurements (when applicable) are obtained utilizing NASCET criteria, using the distal internal carotid diameter as the denominator. CONTRAST:  46m OMNIPAQUE IOHEXOL 350 MG/ML SOLN COMPARISON:  Prior head CT from earlier the same day. FINDINGS: CTA NECK FINDINGS Aortic arch: Visualized aortic arch normal in caliber. Origin of the great vessels incompletely assessed on this exam. Atheromatous change about the origin of the left subclavian artery without significant stenosis. Right carotid system: Right CCA patent from its origin to the bifurcation without stenosis. Scattered calcified plaque about the right carotid bulb without significant stenosis. Right ICA patent distally without stenosis, dissection or occlusion. Left carotid system: Origin of the left CCA not visualized. Visualized left CCA patent to the bifurcation without stenosis. Eccentric calcified plaque at the left carotid bulb without significant stenosis. Left ICA patent distally without stenosis, dissection or occlusion. Vertebral arteries: Both vertebral arteries arise  from subclavian arteries. Right vertebral artery dominant. Vertebral arteries patent within the neck without stenosis, dissection or occlusion. Skeleton: No visible acute osseous abnormality. No discrete or worrisome osseous lesions. 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 Other neck: No other visible acute soft tissue abnormality within the neck. Enlarged multinodular goiter, with dominant 2.6 cm right thyroid nodule. This has been evaluated on previous imaging in 2013. (ref: J Am Coll Radiol. 2015 Feb;12(2): 143-50). No other mass or adenopathy. Upper chest: Diffuse interlobular septal thickening seen within the visualized lungs, suggesting pulmonary interstitial edema. Visualized upper chest demonstrates no other acute finding. Review of the MIP images confirms the above findings CTA HEAD FINDINGS Anterior circulation: Petrous segments patent bilaterally. Extensive atheromatous change throughout the carotid siphons with associated moderate to advanced diffuse narrowing. A1 segments patent bilaterally. Normal anterior communicating artery complex. Anterior cerebral arteries patent to their distal aspects without stenosis. No M1 stenosis or occlusion. Normal MCA bifurcations. Distal MCA branches well perfused and symmetric. Posterior circulation:  Atheromatous change within the mid V4 segments bilaterally with associated moderate stenoses. Left PICA origin patent and normal. Right PICA not seen. Basilar patent to its distal aspect without stenosis. Superior cerebellar arteries patent bilaterally. Left PCA supplied via the basilar. Predominant fetal type origin of the right PCA. PCAs patent to their distal aspects without stenosis. Venous sinuses: Patent allowing for timing the contrast bolus. Anatomic variants: Predominant fetal type origin of the right PCA. No intracranial aneurysm. No vascular abnormality seen underlying the hemorrhages at the pons and cerebellum. Review of the MIP images  confirms the above findings IMPRESSION: 1. Negative CTA for large vessel occlusion. No vascular abnormality seen underlying the hemorrhages at the pons and cerebellum. 2. Extensive atheromatous change throughout the carotid siphons with associated moderate to advanced diffuse narrowing. 3. Atheromatous change about the V4 segments bilaterally with associated moderate stenoses. 4. Diffuse interlobular septal thickening within the visualized lungs, suggesting pulmonary interstitial edema. Electronically Signed   By: Jeannine Boga M.D.   On: 09/24/2020 21:57    CT Head Wo Contrast   Addendum Date: 09/24/2020   ADDENDUM REPORT: 09/24/2020 20:39 ADDENDUM: The patient was subsequently sedated and return for repeat imaging which demonstrates evidence of a parenchymal hemorrhage in the right half of the pons with suggestion of local extension into the prepontine cistern. The density seen in the midline posteriorly may represent some dependent spread of hemorrhage although some artifact does remain. Critical Value/emergent results were called by telephone at the time of interpretation on 09/24/2020 at 8:39 pm to Dr. Varney Biles , who verbally acknowledged these results. Electronically Signed   By: Inez Catalina M.D.   On: 09/24/2020 20:39    Result Date: 09/24/2020 CLINICAL DATA:  Headaches, no known injury, initial encounter EXAM: CT HEAD WITHOUT CONTRAST TECHNIQUE: Contiguous axial images were obtained from the base of the skull through the vertex without intravenous contrast. COMPARISON:  05/02/2008 FINDINGS: Brain: Images are significantly limited by patient motion artifact. Mild atrophic changes and chronic white matter ischemic changes are seen. There are areas of increased attenuation anterior to the pons as well as posteriorly in the midline in the cerebellum. These are likely related to artifact although the possibility of underlying hemorrhage could not be totally excluded on this exam. Repeat imaging  when the patient can better tolerate the exam is recommended. Vascular: No hyperdense vessel or unexpected calcification. Skull: Normal. Negative for fracture or focal lesion. Sinuses/Orbits: No acute finding. Other: None. IMPRESSION: Significantly limited exam. There are findings suspicious for hemorrhage in the posterior fossa and given the clinical history repeat imaging with sedation is recommended to allow the patient to better tolerate the procedure. No other focal abnormality is noted. Electronically Signed: By: Inez Catalina M.D. On: 09/24/2020 19:55    CT Angio Neck W and/or Wo Contrast   Result Date: 09/24/2020 CLINICAL DATA:  Initial evaluation for acute intracranial hemorrhage. EXAM: CT ANGIOGRAPHY HEAD AND NECK TECHNIQUE: Multidetector CT imaging of the head and neck was performed using the standard protocol during bolus administration of intravenous contrast. Multiplanar CT image reconstructions and MIPs were obtained to evaluate the vascular anatomy. Carotid stenosis measurements (when applicable) are obtained utilizing NASCET criteria, using the distal internal carotid diameter as the denominator. CONTRAST:  51m OMNIPAQUE IOHEXOL 350 MG/ML SOLN COMPARISON:  Prior head CT from earlier the same day. FINDINGS: CTA NECK FINDINGS Aortic arch: Visualized aortic arch normal in caliber. Origin of the great vessels incompletely assessed on this exam. Atheromatous change about the  origin of the left subclavian artery without significant stenosis. Right carotid system: Right CCA patent from its origin to the bifurcation without stenosis. Scattered calcified plaque about the right carotid bulb without significant stenosis. Right ICA patent distally without stenosis, dissection or occlusion. Left carotid system: Origin of the left CCA not visualized. Visualized left CCA patent to the bifurcation without stenosis. Eccentric calcified plaque at the left carotid bulb without significant stenosis. Left ICA patent  distally without stenosis, dissection or occlusion. Vertebral arteries: Both vertebral arteries arise from subclavian arteries. Right vertebral artery dominant. Vertebral arteries patent within the neck without stenosis, dissection or occlusion. Skeleton: No visible acute osseous abnormality. No discrete or worrisome osseous lesions. 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 Other neck: No other visible acute soft tissue abnormality within the neck. Enlarged multinodular goiter, with dominant 2.6 cm right thyroid nodule. This has been evaluated on previous imaging in 2013. (ref: J Am Coll Radiol. 2015 Feb;12(2): 143-50). No other mass or adenopathy. Upper chest: Diffuse interlobular septal thickening seen within the visualized lungs, suggesting pulmonary interstitial edema. Visualized upper chest demonstrates no other acute finding. Review of the MIP images confirms the above findings CTA HEAD FINDINGS Anterior circulation: Petrous segments patent bilaterally. Extensive atheromatous change throughout the carotid siphons with associated moderate to advanced diffuse narrowing. A1 segments patent bilaterally. Normal anterior communicating artery complex. Anterior cerebral arteries patent to their distal aspects without stenosis. No M1 stenosis or occlusion. Normal MCA bifurcations. Distal MCA branches well perfused and symmetric. Posterior circulation: Atheromatous change within the mid V4 segments bilaterally with associated moderate stenoses. Left PICA origin patent and normal. Right PICA not seen. Basilar patent to its distal aspect without stenosis. Superior cerebellar arteries patent bilaterally. Left PCA supplied via the basilar. Predominant fetal type origin of the right PCA. PCAs patent to their distal aspects without stenosis. Venous sinuses: Patent allowing for timing the contrast bolus. Anatomic variants: Predominant fetal type origin of the right PCA. No intracranial aneurysm. No vascular  abnormality seen underlying the hemorrhages at the pons and cerebellum. Review of the MIP images confirms the above findings IMPRESSION: 1. Negative CTA for large vessel occlusion. No vascular abnormality seen underlying the hemorrhages at the pons and cerebellum. 2. Extensive atheromatous change throughout the carotid siphons with associated moderate to advanced diffuse narrowing. 3. Atheromatous change about the V4 segments bilaterally with associated moderate stenoses. 4. Diffuse interlobular septal thickening within the visualized lungs, suggesting pulmonary interstitial edema. Electronically Signed   By: Jeannine Boga M.D.   On: 09/24/2020 21:57    MR BRAIN WO CONTRAST   Result Date: 09/25/2020 CLINICAL DATA:  Previous stroke. Chronic atrial fibrillation. Right hemiparesis. New onset headache. Intracranial hemorrhage. EXAM: MRI HEAD WITHOUT CONTRAST TECHNIQUE: Multiplanar, multiecho pulse sequences of the brain and surrounding structures were obtained without intravenous contrast. COMPARISON:  CT studies yesterday. FINDINGS: Brain: Acute intraparenchymal hemorrhage within the right side of the pons is redemonstrated without appreciable change. Acute hemorrhage in the inferior cerebellar vermis is redemonstrated without appreciable change. Chronic small-vessel ischemic changes are present elsewhere throughout the pons. Few other old small vessel cerebellar infarctions. Cerebral hemispheres show chronic small-vessel disease throughout the thalami and hemispheric white matter. Few old basal ganglia infarctions. No large vessel territory infarction. Numerous scattered punctate foci of hemosiderin deposition related to some of the old small vessel infarctions. Small amount of blood dependent within the occipital horns of the lateral ventricles and in the perimesencephalic cistern. No hydrocephalus. No sign of  mass lesion. Vascular: Major vessels at the base of the brain show flow. Skull and upper cervical  spine: Negative Sinuses/Orbits: Clear/normal Other: None IMPRESSION: Redemonstration of acute intraparenchymal hemorrhage within the right side of the pons and within the inferior cerebellar vermis. Small amount of subarachnoid blood in the perimesencephalic cistern, interfoliar subarachnoid spaces of the posterior fossa and dependent within the occipital horns of the lateral ventricles. No evidence of increasing or new hemorrhage. No hydrocephalus. Extensive chronic small-vessel ischemic changes elsewhere throughout the brain, many with punctate hemosiderin deposition. Overall findings most consistent with hypertensive disease. Electronically Signed   By: Nelson Chimes M.D.   On: 09/25/2020 07:33    DG Chest Port 1 View   Result Date: 09/24/2020 CLINICAL DATA:  Shortness of breath cough EXAM: PORTABLE CHEST 1 VIEW COMPARISON:  02/10/2020 FINDINGS: Cardiac shadow remains enlarged. Mild vascular congestion is noted although improved when compared with the prior exam. No sizable effusion is noted. No pneumothorax is seen. No bony abnormality is noted. IMPRESSION: Mild CHF although improved from the prior study. Electronically Signed   By: Inez Catalina M.D.   On: 09/24/2020 18:49         Assessment/Plan: Diagnosis: 86 yo with hx of prior left CVA with residual right hemiparesis, admitted with intraparenchymal hemorrhage involving the right pons and cerebellar vermis, SAH 1. Does the need for close, 24 hr/day medical supervision in concert with the patient's rehab needs make it unreasonable for this patient to be served in a less intensive setting? Yes 2. Co-Morbidities requiring supervision/potential complications: a fib, morbid obesity, right heel cord contracture, dCHF, CKD III 3. Due to bladder management, bowel management, safety, skin/wound care, disease management, medication administration, pain management and patient education, does the patient require 24 hr/day rehab nursing? Yes 4. Does the  patient require coordinated care of a physician, rehab nurse, therapy disciplines of PT, OT, SLP to address physical and functional deficits in the context of the above medical diagnosis(es)? Yes Addressing deficits in the following areas: balance, endurance, locomotion, strength, transferring, bowel/bladder control, bathing, dressing, feeding, grooming, toileting, cognition and psychosocial support 5. Can the patient actively participate in an intensive therapy program of at least 3 hrs of therapy per day at least 5 days per week? Yes 6. The potential for patient to make measurable gains while on inpatient rehab is excellent 7. Anticipated functional outcomes upon discharge from inpatient rehab are mod assist  with PT, min assist and mod assist with OT, modified independent with SLP. 8. Estimated rehab length of stay to reach the above functional goals is: 20-24 days 9. Anticipated discharge destination: Home 10. Overall Rehab/Functional Prognosis: excellent   RECOMMENDATIONS: This patient's condition is appropriate for continued rehabilitative care in the following setting: CIR Patient has agreed to participate in recommended program. Yes Note that insurance prior authorization may be required for reimbursement for recommended care.   Comment: Despite her previous right hemiparesis and heel cord contracture which caused her to "walk on her toes," Mrs. Hanisch was independent at home with her walker for mobility. She dressed and bathed herself. She used a w/c only for longer dx mobility in the community such as when she went into a store. She is motivated to improve and wants to go home with her family. Rehab Admissions Coordinator to follow up.   Thanks,   Meredith Staggers, MD, Mellody Drown   I have personally performed a face to face diagnostic evaluation of this patient. Additionally, I have examined pertinent labs  and radiographic images. I have reviewed and concur with the physician assistant's  documentation above.     Lavon Paganini Angiulli, PA-C 09/25/2020

## 2020-09-30 NOTE — Progress Notes (Signed)
PMR Admission Coordinator Pre-Admission Assessment   Patient: [name] is an 86 y.o., female MRN: [id number] DOB: [birth year] Height: 5' 8"  (172.7 cm) Weight: 90.7 kg                                                                                                                                                  Insurance Information HMO: yes    PPO:      PCP:      IPA:      80/20:      OTHER:  PRIMARY: [employer]      Policy#: [id number]      Subscriber: pt CM Name: Shirlean Mylar      Phone#: [phone] ext [phone]     Fax#: [phone] Pre-Cert#: [id number] [place] for CIR provided by Shirlean Mylar with updates due to Edwena Felty [phone]) on 5/20   Employer:  Benefits:  Phone #: [phone]     Name:  Eff. Date: 05/19/19     Deduct: $0      Out of Pocket Max: $3900 (met $7.20)      Life Max: n/a  CIR: $295/day for days 1-6 (max $1770)      SNF: 100% Outpatient:      Co-Pay: $10-$20 visits Home Health: 100%      Co-Pay:  DME: 80%     Co-Pay: 20% Providers:  SECONDARY:       Policy#:       Phone#:    Development worker, community:       Phone#:    The Therapist, art Information Summary" for patients in Inpatient Rehabilitation Facilities with attached "Privacy Act [place] Records" was provided and verbally reviewed with: Family   Emergency Contact Information         Contact Information     Name Relation Home Work Mobile    [place] Daughter     [phone]    [place] Daughter [phone]        Achilles Dunk Daughter [phone]        Pinchback,Davita Granddaughter [phone]   [phone]       Current Medical History  Patient Admitting Diagnosis: CVA   History of Present Illness: [name] is a 86 y.o. right-handed female with history of previous stroke with right hemiparesis, chronic atrial fibrillation maintained on Eliquis, diabetes mellitus, diastolic congestive heart failure, CKD stage III. Presented 09/24/2020 with acute onset of headache  as well as slurred speech.  Denied any double vision.  While in the ED noted some shortness of breath pulmonary edema noted on chest x-ray BiPAP initiated.  Cranial CT scan suspicious for hemorrhage in the posterior fossa.  CT angiogram of head and neck negative for large vessel occlusion.  No vascular abnormalities.  MRI of the brain redemonstrates acute intraparenchymal hemorrhage within the right side of the pons and within the inferior  cerebellar vermis.  Small amount of subarachnoid blood in the perimesencephalic cistern, interfoliar subarachnoid spaces of the posterior fossa independent within the occipital horns of the lateral ventricles.  No hydrocephalus.  Admission chemistries unremarkable except potassium 2.3, glucose 217, creatinine 1.58, urine drug screen negative.  Echocardiogram with ejection fraction of 55 to 69% grade 2 diastolic dysfunction..  Maintained on Cleviprex for blood pressure control.  Patient did receive Andexxa to reverse Eliquis.  Maintained on a regular consistency diet.  Due to patient decreased functional ability recommendations of physical medicine rehab consult.   Complete NIHSS TOTAL: 2 Glasgow Coma Scale Score: (!) 20   Past Medical History      Past Medical History:  Diagnosis Date  . Arthritis    . CHF (congestive heart failure) ([facility])    . Chronic diastolic heart failure ([facility])    . CKD (chronic kidney disease) stage 3, GFR 30-59 ml/min ([facility])    . Essential hypertension    . GERD (gastroesophageal reflux disease)    . Hemorrhoids    . History of stroke    . Type 2 diabetes mellitus ([facility])        Family History  family history includes Crohn's disease in her daughter; Diabetes in her brother, sister, and sister; Heart attack in her sister; Hypertension in her mother and sister.   Prior Rehab/Hospitalizations:  Has the patient had prior rehab or hospitalizations prior to admission? Yes   Has the patient had major surgery during 100 days prior to  admission? No   Current Medications    Current Facility-Administered Medications:  .  acetaminophen (TYLENOL) tablet 650 mg, 650 mg, Oral, Q4H PRN, 650 mg at 09/27/20 0206 **OR** acetaminophen (TYLENOL) 160 MG/5ML solution 650 mg, 650 mg, Per Tube, Q4H PRN **OR** acetaminophen (TYLENOL) suppository 650 mg, 650 mg, Rectal, Q4H PRN, Bailey-Modzik, Delila A, NP .  amLODipine (NORVASC) tablet 10 mg, 10 mg, Oral, Daily, Hall, Carole N, DO, 10 mg at 09/30/20 0905 .  Chlorhexidine Gluconate Cloth 2 % PADS 6 each, 6 each, Topical, Daily, Bailey-Modzik, Delila A, NP, 6 each at 09/30/20 0906 .  hydrALAZINE (APRESOLINE) injection 10 mg, 10 mg, Intravenous, Q6H PRN, Bailey-Modzik, Delila A, NP, 10 mg at 09/28/20 0402 .  hydrALAZINE (APRESOLINE) tablet 100 mg, 100 mg, Oral, Q8H, Rosalin Hawking, MD, 100 mg at 09/30/20 1336 .  insulin aspart (novoLOG) injection 0-20 Units, 0-20 Units, Subcutaneous, TID WC, Bailey-Modzik, Delila A, NP, 7 Units at 09/30/20 1225 .  insulin aspart (novoLOG) injection 0-5 Units, 0-5 Units, Subcutaneous, QHS, Bailey-Modzik, Delila A, NP, 2 Units at 09/28/20 2149 .  insulin aspart (novoLOG) injection 4 Units, 4 Units, Subcutaneous, TID WC, Patrecia Pour, MD, 4 Units at 09/30/20 1225 .  insulin detemir (LEVEMIR) injection 35 Units, 35 Units, Subcutaneous, BID, Patrecia Pour, MD, 35 Units at 09/30/20 0905 .  isosorbide mononitrate (IMDUR) 24 hr tablet 60 mg, 60 mg, Oral, Daily, Bailey-Modzik, Delila A, NP, 60 mg at 09/30/20 0905 .  pantoprazole (PROTONIX) EC tablet 40 mg, 40 mg, Oral, Daily, Bailey-Modzik, Delila A, NP, 40 mg at 09/30/20 0905 .  senna-docusate (Senokot-S) tablet 1 tablet, 1 tablet, Oral, BID, Bailey-Modzik, Delila A, NP, 1 tablet at 09/30/20 0905 .  sodium chloride (OCEAN) 0.65 % nasal spray 1 spray, 1 spray, Each Nare, PRN, Garvin Fila, MD   Facility-Administered Medications Ordered in Other Encounters:  .  regadenoson (LEXISCAN) injection SOLN 0.4 mg, 0.4 mg,  Intravenous, Once, Croitoru, Mihai, MD   Patients  Current Diet:     Diet Order                      DIET DYS 2 Room service appropriate? Yes with Assist; Fluid consistency: Thin  Diet effective now                      Precautions / Restrictions Precautions Precautions: Fall Precaution Comments: Goal SBP < 140 Restrictions Weight Bearing Restrictions: No    Has the patient had 2 or more falls or a fall with injury in the past year?Yes   Prior Activity Level Household: daughter reports limited household ambulation at baseline with RW.  She was mod I with mobility and ADLs at home, and using w/c for community distances   Prior Functional Level Prior Function Level of Independence: Needs assistance Gait / Transfers Assistance Needed: RW; very old w/c ADL's / Homemaking Assistance Needed: ADL by pt; iADL from family; cooking,   Self Care: Did the patient need help bathing, dressing, using the toilet or eating?  Independent   Indoor Mobility: Did the patient need assistance with walking from room to room (with or without device)? Independent   Stairs: Did the patient need assistance with internal or external stairs (with or without device)? Needed some help   Functional Cognition: Did the patient need help planning regular tasks such as shopping or remembering to take medications? Needed some help   Home Assistive Devices / [place] Devices/Equipment: Gilford Rile (specify type) Home Equipment: Walker - 2 wheels,Bedside commode,Wheelchair - manual,Shower seat,Toilet riser,Tub bench,Hospital bed,Adaptive equipment (states she needs a new w/c)   Prior Device Use: Indicate devices/aids used by the patient prior to current illness, exacerbation or injury? Manual wheelchair and Walker   Current Functional Level Cognition   Arousal/Alertness: Awake/alert Overall Cognitive Status: No family/caregiver present to determine baseline cognitive functioning Orientation  Level: Oriented X4 Safety/Judgement: Decreased awareness of safety,Decreased awareness of deficits General Comments: overall WFL for simple tasks, not formally assessed Attention: Focused,Sustained Focused Attention: Impaired Focused Attention Impairment: Verbal complex,Functional complex Sustained Attention: Impaired Sustained Attention Impairment: Verbal complex,Functional complex Memory: Impaired Memory Impairment: Decreased short term memory,Decreased recall of new information Decreased Short Term Memory: Verbal complex,Functional complex Problem Solving: Impaired Problem Solving Impairment: Verbal complex,Functional complex Executive Function: Organizing,Self Monitoring Organizing: Impaired Organizing Impairment: Verbal complex,Functional complex Self Monitoring: Impaired Self Monitoring Impairment: Verbal complex,Functional complex Safety/Judgment: Impaired    Extremity Assessment (includes Sensation/Coordination)   Upper Extremity Assessment: Generalized weakness,LUE deficits/detail,RUE deficits/detail RUE Deficits / Details: 3/5 MM grade LUE Deficits / Details: 3+/5 MM grade  Lower Extremity Assessment: Generalized weakness,RLE deficits/detail RLE Deficits / Details: weakness, prior CVA RLE Sensation: WNL LLE Deficits / Details: Grossly 3/5     ADLs   Overall ADL's : Needs assistance/impaired Eating/Feeding: Set up,Sitting Grooming: Wash/dry face,Bed level,Set up Grooming Details (indicate cue type and reason): washing face from bed level Upper Body Bathing: Minimal assistance,Sitting Lower Body Bathing: Total assistance,+2 for physical assistance,+2 for safety/equipment,Sitting/lateral leans,Sit to/from stand Upper Body Dressing : Maximal assistance,Bed level Upper Body Dressing Details (indicate cue type and reason): to change gown Lower Body Dressing: Total assistance,+2 for physical assistance,Sitting/lateral leans,Sit to/from stand,Cueing for safety Toilet  Transfer: Total assistance,+2 for physical assistance,+2 for safety/equipment Toilet Transfer Details (indicate cue type and reason): deferred OOB mobility d/t elevated BP Toileting- Clothing Manipulation and Hygiene: Total assistance,+2 for physical assistance,+2 for safety/equipment,Sitting/lateral lean,Bed level Functional mobility during ADLs: Maximal assistance,+2 for physical assistance,+2  for safety/equipment,Rolling walker,Cueing for safety,Cueing for sequencing (attempt at EOB) General ADL Comments: limited session d/t elevated BP ( SBP goals of >140) 159/59, session completed from bed level     Mobility   Overal bed mobility: Needs Assistance Bed Mobility: Supine to Sit,Sit to Supine Supine to sit: Max assist,+2 for physical assistance Sit to supine: Max assist,+2 for physical assistance General bed mobility comments: deferred d/t BP     Transfers   Overall transfer level: Needs assistance Equipment used: 2 person hand held assist Transfers: Sit to/from Stand,Lateral/Scoot Transfers Sit to Stand: Total assist,+2 physical assistance,From elevated surface  Lateral/Scoot Transfers: Total assist,+2 physical assistance General transfer comment: deferred d/t BP     Ambulation / Gait / Stairs / Wheelchair Mobility   Ambulation/Gait General Gait Details: Unable     Posture / Balance Dynamic Sitting Balance Sitting balance - Comments: Posterior assistance of up to modA for static sitting EOB, majority of time only needing minA. Balance Overall balance assessment: Needs assistance Sitting-balance support: Feet supported Sitting balance-Leahy Scale: Poor Sitting balance - Comments: Posterior assistance of up to modA for static sitting EOB, majority of time only needing minA. Postural control: Posterior lean Standing balance support: Bilateral upper extremity supported Standing balance-Leahy Scale: Zero Standing balance comment: Deferred this session for safety     Special  needs/care consideration Diabetic management yes        Previous Home Environment (from acute therapy documentation) Living Arrangements: Children  Lives With: Son (grandson) Available Help at Discharge: Family,Available PRN/intermittently Type of Home: House Home Layout: One level Home Access: Ramped entrance Bathroom Shower/Tub: Multimedia programmer: Handicapped height Bathroom Accessibility: Yes Home Care Services: No Additional Comments: sleeps in recliner, gets up from [facility] elevated position   Discharge Living Setting Plans for Discharge Living Setting: Patient's home,Lives with (comment) (son (could also d/c to any of her childrens' homes)) Type of Home at Discharge: House Discharge Home Layout: One level Discharge Home Access: Ramped entrance,Stairs to enter Entrance Stairs-Rails: Right,Left Entrance Stairs-Number of Steps: 2 (small steps, which she struggled greatly with at baseline) Discharge Bathroom Shower/Tub: Tub/shower unit Discharge Bathroom Toilet: Standard Discharge Bathroom Accessibility: Yes How Accessible: Accessible via walker Does the patient have any problems obtaining your medications?: No   Social/Family/Support Systems Anticipated Caregiver: 8 children, Aniceto Boss is main contact Anticipated Caregiver's Contact Information: [phone] Ability/Limitations of Caregiver: n/a Caregiver Availability: 24/7 Discharge Plan Discussed with Primary Caregiver: Yes Is Caregiver In Agreement with Plan?: Yes Does Caregiver/Family have Issues with Lodging/Transportation while Pt is in Rehab?: No     Goals Patient/Family Goal for Rehab: PT mod assist, OT min to mod assist, SLP mod I Expected length of stay: 20-24 days Pt/Family Agrees to Admission and willing to participate: Yes Program Orientation Provided & Reviewed with Pt/Caregiver Including Roles  & Responsibilities: Yes Additional Information Needs: no  Barriers to Discharge: Insurance for SNF  coverage     Decrease burden of Care through IP rehab admission: n/a   Possible need for SNF placement upon discharge: Not anticipated.  Pt with good family support from her 8 children.       Patient Condition: This patient's condition remains as documented in the consult dated 09/26/2020, in which the Rehabilitation Physician determined and documented that the patient's condition is appropriate for intensive rehabilitative care in an inpatient rehabilitation facility. Will admit to inpatient rehab today.   Preadmission Screen Completed By:  Michel Santee, PT, DPT 09/30/2020 3:28 PM ______________________________________________________________________   Discussed status  with Dr. Ranell Patrick on 09/30/20 at 3:33 PM  and received approval for admission today.   Admission Coordinator:  Michel Santee, PT, DPT time 3:33 PM Sudie Grumbling 09/30/20              Cosigned by: Izora Ribas, MD at 09/30/2020  3:55 PM

## 2020-09-30 NOTE — Progress Notes (Signed)
Inpatient Diabetes Program Recommendations  AACE/ADA: New Consensus Statement on Inpatient Glycemic Control (2015)  Target Ranges:  Prepandial:   less than 140 mg/dL      Peak postprandial:   less than 180 mg/dL (1-2 hours)      Critically ill patients:  140 - 180 mg/dL   Results for JANIYHA, HOLUB (MRN [id number]) as of 09/30/2020 13:34  Ref. Range 09/30/2020 06:25 09/30/2020 11:38  Glucose-Capillary Latest Ref Range: 70 - 99 mg/dL 229 (H)  11 units NOVOLOG  35 units LEVEMIR  243 (H)  11 units NOVOLOG      Home DM Meds: Levemir 80 units qhs       Novolog 0-10 units tid  Current Orders: Levemir 35 units BID      Novolog Resistant Correction Scale/ SSI (0-20 units) TID AC + HS      Novolog 4 units TID with meals    MD- Note CBGs remain elevated today  Please consider:  1. Increase Levemir further to 40 units BID  2. Increase Novolog Meal Coverage to 6 units TID with meals   --Will follow patient during hospitalization--  Wyn Quaker RN, MSN, CDE Diabetes Coordinator Inpatient Glycemic Control Team Team Pager: [phone] (8a-5p)

## 2020-09-30 NOTE — H&P (Signed)
Physical Medicine and Rehabilitation Admission H&P  CC: Intraparenchymal hemorrhage  HPI: [name] is an 86 year old right-handed female with history of previous stroke with right hemiparesis, chronic atrial fibrillation maintained on Eliquis, diabetes mellitus, diastolic congestive heart failure, CKD stage III.  Per chart review she lives with her son.  1 level home ramped entrance.  Used a rolling walker prior to admission as well as needing assistance for ADLs.  She has multiple family in the area with good support.  Presented 09/24/2020 with acute onset of headache as well as slurred speech.  Denied any double vision.  While in the ED noted some shortness of breath pulmonary edema noted on chest x-ray BiPAP initiated.  Cranial CT scan suspicious for hemorrhage in the posterior fossa.  CT angiogram of head and neck negative for large vessel occlusion.  No vascular abnormalities.  MRI of the brain redemonstrated acute intraparenchymal hemorrhage within the right side of the pons and within the inferior cerebellar vermis.  Small amount of subarachnoid blood in the perimesencephalic cistern, intra foliar subarachnoid spaces of the posterior fossa and dependent within the occipital horn of the lateral ventricles.  No evidence of increasing or new hemorrhage.  No hydrocephalus.  Admission chemistries unremarkable except potassium 2.3 glucose 217 creatinine 1.58 urine drug screen negative.  Echocardiogram with ejection fraction of 55 to [id number] grade 2 diastolic dysfunction.  Maintained on Cleviprex for blood pressure control.  Patient did receive Andexxa to reverse her Eliquis.  Maintained on a regular consistency diet.  Due to patient decreased functional mobility and slurred speech she was admitted for a comprehensive rehab program. Medically stable for CIR today.  Review of Systems  Constitutional: Negative for chills and fever.  HENT: Negative for hearing loss.   Eyes: Negative for blurred vision  and double vision.  Respiratory: Negative for cough and shortness of breath.   Cardiovascular: Positive for leg swelling. Negative for chest pain and palpitations.  Gastrointestinal: Positive for constipation. Negative for heartburn, nausea and vomiting.       GERD  Genitourinary: Negative for dysuria, flank pain and hematuria.  Musculoskeletal: Positive for joint pain and myalgias.  Skin: Negative for rash.  Neurological: Positive for speech change and headaches.  All other systems reviewed and are negative.  Past Medical History:  Diagnosis Date  . Arthritis   . CHF (congestive heart failure) ([place])   . Chronic diastolic heart failure ([place])   . CKD (chronic kidney disease) stage 3, GFR 30-59 ml/min (HCC)   . Essential hypertension   . GERD (gastroesophageal reflux disease)   . Hemorrhoids   . History of stroke   . Type 2 diabetes mellitus ([place])    Past Surgical History:  Procedure Laterality Date  . BACK SURGERY    . CATARACT EXTRACTION W/PHACO Left 02/28/2013   Procedure: CATARACT EXTRACTION PHACO AND INTRAOCULAR LENS PLACEMENT (IOL) CDE=15.60;  Surgeon: Elta Guadeloupe T. Gershon Crane, MD;  Location: [facility];  Service: Ophthalmology;  Laterality: Left;  . CHOLECYSTECTOMY    . COLONOSCOPY  12/12/2003   RMR: Normal rectum.  Long capacious tortuous colon, colonic mucosa appeared normal  . COLONOSCOPY N/A 03/21/2014   Dr. [name]: internal and external hemorrhoids, torturous colon, colonic diverticulosis   . ESOPHAGOGASTRODUODENOSCOPY  12/28/2001   [id number] sliding hiatal hernia/. Three small bulbar ulcers, two with stigmata of bleeding and these were coagulated using heater probe unit   . ESOPHAGOGASTRODUODENOSCOPY N/A 03/21/2014   Dr. [name]: cervical esophageal web s/p dilation, negative H.pylori   .  EYE SURGERY    . HIP FRACTURE SURGERY  2008  . JOINT REPLACEMENT     Rt hip, ????? sounds like just for fracture  . MALONEY DILATION N/A 03/21/2014   Procedure: [name] DILATION;  Surgeon: [name], MD;  Location: [facility];  Service: Endoscopy;  Laterality: N/A;  . SAVORY DILATION N/A 03/21/2014   Procedure: SAVORY DILATION;  Surgeon: [name], MD;  Location: [facility];  Service: Endoscopy;  Laterality: N/A;   Family History  Problem Relation Age of Onset  . Crohn's disease Daughter   . Hypertension Mother   . Hypertension Sister   . Diabetes Brother   . Diabetes Sister   . Diabetes Sister   . Heart attack Sister   . Colon cancer Neg Hx    Social History:  reports that she has never smoked. She has never used smokeless tobacco. She reports that she does not drink alcohol and does not use drugs. Allergies: No Known Allergies Medications Prior to Admission  Medication Sig Dispense Refill  . acetaminophen (TYLENOL) 325 MG tablet Take 2 tablets (650 mg total) by mouth every 6 (six) hours as needed for mild pain, fever or headache (or Fever >/= 101). 12 tablet 0  . albuterol (VENTOLIN HFA) 108 (90 Base) MCG/ACT inhaler Inhale 2 puffs into the lungs every 6 (six) hours as needed for wheezing or shortness of breath. 18 g 0  . amLODipine (NORVASC) 10 MG tablet Take 1 tablet (10 mg total) by mouth daily.    Marland Kitchen gabapentin (NEURONTIN) 300 MG capsule Take 300 mg by mouth 2 (two) times daily.     . hydrALAZINE (APRESOLINE) 100 MG tablet Take 1 tablet (100 mg total) by mouth 3 (three) times daily.    . hydrocortisone (ANUSOL-HC) 2.5 % rectal cream Place 1 application rectally 2 (two) times daily as needed for hemorrhoids or anal itching.    . insulin aspart (NOVOLOG) 100 UNIT/ML FlexPen Inject 0-10 Units into the skin 3 (three) times daily with meals. insulin aspart (novoLOG) injection 0-10 Units 0-10 Units Subcutaneous, 3 times daily with meals CBG < 70: Implement Hypoglycemia Standing Orders and refer to Hypoglycemia Standing Orders sidebar report  CBG 70 - 120: 0 unit CBG 121 - 150: 0 unit  CBG 151 - 200: 1 unit CBG 201 - 250: 2 units CBG 251 - 300: 4 units CBG 301 - 350: 6  units  CBG 351 - 400: 8 units  CBG > 400: 10 units 15 mL 11  . insulin detemir (LEVEMIR) 100 UNIT/ML injection Inject 0.4 mLs (40 Units total) into the skin 2 (two) times daily.    . isosorbide mononitrate (IMDUR) 60 MG 24 hr tablet Take 1 tablet (60 mg total) by mouth daily.    . ondansetron (ZOFRAN) 4 MG tablet Take 4 mg by mouth every 6 (six) hours as needed for nausea or vomiting.    . pantoprazole (PROTONIX) 40 MG tablet Take 1 tablet by mouth daily.    Marland Kitchen torsemide (DEMADEX) 20 MG tablet Take 1 tablet (20 mg total) by mouth daily. 30 tablet 3    Drug Regimen Review Drug regimen was reviewed and remains appropriate with no significant issues identified  Home:     Functional History:    Functional Status:  Mobility:          ADL:    Cognition: Cognition Orientation Level: Oriented X4    Physical Exam: Blood pressure (!) 161/59, pulse 78, temperature 98.4  F (36.9 C), temperature source Oral, resp. rate 18, height '5\' 4"'$  (1.626 m), weight 103.8 kg, SpO2 95 %. Gen: no distress, normal appearing HEENT: oral mucosa pink and moist, NCAT Cardio: Reg rate Chest: normal effort, normal rate of breathing Abd: soft, non-distended Ext: no edema Psych: pleasant, normal affect Skin: intact Neurological:     Comments: Patient is alert in no acute distress.  Speech is a bit dysarthric but intelligible.  Oriented to person place.  Follows simple commands.  Fair awareness and insight. RLE 2/5, RUE 5/5, LLE 4/5, LUE 5/5  Results for orders placed or performed during the hospital encounter of 09/24/20 (from the past 48 hour(s))  Glucose, capillary     Status: Abnormal   Collection Time: 09/28/20  9:32 PM  Result Value Ref Range   Glucose-Capillary 250 (H) 70 - 99 mg/dL    Comment: Glucose reference range applies only to samples taken after fasting for at least 8 hours.  Glucose, capillary     Status: Abnormal   Collection Time: 09/29/20  6:21 AM  Result Value Ref Range    Glucose-Capillary 230 (H) 70 - 99 mg/dL    Comment: Glucose reference range applies only to samples taken after fasting for at least 8 hours.  Glucose, capillary     Status: Abnormal   Collection Time: 09/29/20 12:28 PM  Result Value Ref Range   Glucose-Capillary 214 (H) 70 - 99 mg/dL    Comment: Glucose reference range applies only to samples taken after fasting for at least 8 hours.  Glucose, capillary     Status: Abnormal   Collection Time: 09/29/20  5:01 PM  Result Value Ref Range   Glucose-Capillary 167 (H) 70 - 99 mg/dL    Comment: Glucose reference range applies only to samples taken after fasting for at least 8 hours.  Glucose, capillary     Status: Abnormal   Collection Time: 09/29/20  9:23 PM  Result Value Ref Range   Glucose-Capillary 178 (H) 70 - 99 mg/dL    Comment: Glucose reference range applies only to samples taken after fasting for at least 8 hours.  Glucose, capillary     Status: Abnormal   Collection Time: 09/30/20  6:25 AM  Result Value Ref Range   Glucose-Capillary 229 (H) 70 - 99 mg/dL    Comment: Glucose reference range applies only to samples taken after fasting for at least 8 hours.  Glucose, capillary     Status: Abnormal   Collection Time: 09/30/20 11:38 AM  Result Value Ref Range   Glucose-Capillary 243 (H) 70 - 99 mg/dL    Comment: Glucose reference range applies only to samples taken after fasting for at least 8 hours.  Glucose, capillary     Status: Abnormal   Collection Time: 09/30/20  4:18 PM  Result Value Ref Range   Glucose-Capillary 234 (H) 70 - 99 mg/dL    Comment: Glucose reference range applies only to samples taken after fasting for at least 8 hours.   No results found.     Medical Problem List and Plan: 1.  Headache with slurred speech and decreased functional mobility secondary to intraparenchymal hemorrhage involving the right pons and cerebellar vermis, SAH as well as history of CVA with residual right-sided weakness  -patient may  shower  -ELOS/Goals: 2-3 weeks MinA 2.  Antithrombotics: -DVT/anticoagulation: SCDs  -antiplatelet therapy: N/A 3. Pain Management: Tylenol as needed 4. Mood: Provide emotional support  -antipsychotic agents: N/A 5. Neuropsych: This patient he is  capable of making decisions on her own behalf. 6. Skin/Wound Care: Routine skin checks 7. Fluids/Electrolytes/Nutrition: Routine in and outs with follow-up chemistries 8.  Dysphagia.  Dysphagia #2 thin liquids.  Follow-up speech therapy 9.  Hypertension (systolic elevated, diastolic low).  Continue Norvasc 10 mg daily, Imdur 60 mg daily, hydralazine 100 mg every 8 hours.  Monitor with increased mobility 10.  History of atrial fibrillation.  Chronic Eliquis reversed with Andexxa.  Continue to hold blood thinner due to [facility].  Cardiac rate controlled 11.  Diabetes mellitus.  Hemoglobin A1c 9.9.  Increase NovoLog to 5 units 3 times daily, Levemir 35 units twice daily.  Check blood sugars before meals and at bedtime 12.  Diastolic congestive heart failure.  Monitor for any signs of fluid overload 13.  CKD stage III.  Follow-up chemistries 14.  GERD.  Continue Protonix  I have personally performed a face to face diagnostic evaluation, including, but not limited to relevant history and physical exam findings, of this patient and developed relevant assessment and plan.  Additionally, I have reviewed and concur with the physician assistant's documentation above.  Izora Ribas, MD 09/30/2020   Lavon Paganini Angiulli, PA-C

## 2020-09-30 NOTE — Progress Notes (Signed)
Physical Therapy Treatment Patient Details Name: [name] MRN: [id number] DOB: Aug 18, 1934 Today's Date: 09/30/2020    History of Present Illness Pt is a 86 y.o. F admitted with acute onset headache. 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 While in ED, she had some SOB and pulmonary edema on chest x-ray and was started on BiPAP. Significant PMH: CHF, CKD, HTN, DM2, prior stroke with right hemiplegia.    PT Comments    Pt agreeable to OOB mobility attempts this day. Overall, pt requiring max-total +2 assist for to/from EOB. Pt reports feeling weak today, says "I haven't been OOB at all since I've been here" which is not true but mobility has been limited secondary to BP parameters. Pt again limited by BP today, with BP 185/72, HR 70s, SpO2 98% on RA at EOB. RN notified and administering BP meds at end of session. PT to continue to follow acutely.    Follow Up Recommendations  CIR     Equipment Recommendations  Wheelchair (measurements PT);Wheelchair cushion (measurements PT)    Recommendations for Other Services       Precautions / Restrictions Precautions Precautions: Fall Precaution Comments: Goal SBP < 160 Restrictions Weight Bearing Restrictions: No    Mobility  Bed Mobility Overal bed mobility: Needs Assistance Bed Mobility: Rolling;Sidelying to Sit;Sit to Sidelying Rolling: Max assist;+2 for physical assistance Sidelying to sit: Max assist;+2 for physical assistance     Sit to sidelying: Total assist;+2 for physical assistance General bed mobility comments: max-total +2 for supine<>sit for trunk and LE management, scooting to/from EOB, righting balance.    Transfers Overall transfer level: Needs assistance   Transfers: Lateral/Scoot Transfers          Lateral/Scoot Transfers: Max assist;+2 physical assistance General transfer comment: max +2 for x2 lateral scoots to [facility] for boost, AP rocking for momentum building,  placement of buttocks on bed. Further mobility deferred d/t BP.  Ambulation/Gait                 Stairs             Wheelchair Mobility    Modified Rankin (Stroke Patients Only) Modified Rankin (Stroke Patients Only) Pre-Morbid Rankin Score: Moderate disability Modified Rankin: Severe disability     Balance Overall balance assessment: Needs assistance Sitting-balance support: Feet supported Sitting balance-Leahy Scale: Poor Sitting balance - Comments: posterior bias, cues for anterior trunk translation Postural control: Posterior lean   Standing balance-Leahy Scale: Zero Standing balance comment: Deferred this session for safety                            Cognition Arousal/Alertness: Awake/alert Behavior During Therapy: WFL for tasks assessed/performed Overall Cognitive Status: No family/caregiver present to determine baseline cognitive functioning Area of Impairment: Problem solving                         Safety/Judgement: Decreased awareness of safety   Problem Solving: Slow processing;Decreased initiation;Requires verbal cues;Requires tactile cues        Exercises      General Comments        Pertinent Vitals/Pain Pain Assessment: Faces Faces Pain Scale: Hurts a little bit Pain Location: posterior head Pain Descriptors / Indicators: Sore;Headache Pain Intervention(s): Limited activity within patient's tolerance;Monitored during session;Repositioned;Other (comment) (RN notified)    Home Living  Prior Function            PT Goals (current goals can now be found in the care plan section) Acute Rehab PT Goals Patient Stated Goal: to feel better PT Goal Formulation: With patient/family Time For Goal Achievement: 10/09/20 Potential to Achieve Goals: Good Progress towards PT goals: Progressing toward goals    Frequency    Min 4X/week      PT Plan Current plan remains appropriate     Co-evaluation PT/OT/SLP Co-Evaluation/Treatment: Yes Reason for Co-Treatment: For patient/therapist safety;To address functional/ADL transfers          AM-PAC PT "6 Clicks" Mobility   Outcome Measure  Help needed turning from your back to your side while in a flat bed without using bedrails?: A Lot Help needed moving from lying on your back to sitting on the side of a flat bed without using bedrails?: Total Help needed moving to and from a bed to a chair (including a wheelchair)?: Total Help needed standing up from a chair using your arms (e.g., wheelchair or bedside chair)?: Total Help needed to walk in hospital room?: Total Help needed climbing 3-5 steps with a railing? : Total 6 Click Score: 7    End of Session   Activity Tolerance: Patient limited by fatigue;Treatment limited secondary to medical complications (Comment) (HTN) Patient left: in bed;with call bell/phone within reach;with bed alarm set;with nursing/sitter in room Nurse Communication: Mobility status;Other (comment) (mild posterior head and neck pain, HTN) PT Visit Diagnosis: Other abnormalities of gait and mobility (R26.89);Muscle weakness (generalized) (M62.81);Difficulty in walking, not elsewhere classified (R26.2)     Time: [id number] PT Time Calculation (min) (ACUTE ONLY): 24 min  Charges:  $Therapeutic Activity: 8-22 mins                      Stacie Glaze, PT DPT Acute Rehabilitation Services Pager [phone]  Office [phone]    Roxine Caddy E Ruffin Pyo 09/30/2020, 5:13 PM

## 2020-09-30 NOTE — TOC Transition Note (Signed)
Transition of Care [facility]) - CM/SW Discharge Note   Patient Details  Name: [name] MRN: [id number] Date of Birth: 12/27/1934  Transition of [facility]) CM/SW Contact:  Pollie Friar, RN Phone Number: 09/30/2020, 3:16 PM   Clinical Narrative:    Patient is discharging to CIR today. CM signing off.   Final next level of care: IP Rehab Facility Barriers to Discharge: No Barriers Identified   Patient Goals and CMS Choice     Choice offered to / list presented to : Patient  Discharge Placement                       Discharge Plan and Services                                     Social Determinants of Health (SDOH) Interventions     Readmission Risk Interventions Readmission Risk Prevention Plan 02/09/2020 07/27/2019  Transportation Screening Complete Complete  Medication Review (RN CM) - Complete  HRI or Home Care Consult Complete -  Social Work Consult for [place] Planning/Counseling Complete -  Palliative Care Screening Not Applicable -  Medication Review Press photographer) Complete -  Some recent data might be hidden

## 2020-09-30 NOTE — Progress Notes (Signed)
Inpatient Rehab Admissions Coordinator:   I've had a bed become available for this patient on CIR today. Dr. Bonner Puna in agreement. TOC aware.  Will call pt's family to let them know and stop by to see pt at bedside.   Shann Medal, PT, DPT Admissions Coordinator [phone] 09/30/20  3:27 PM

## 2020-09-30 NOTE — Progress Notes (Signed)
Inpatient Rehab Admissions Coordinator:   I have no beds available for this patient to admit to CIR today.  Will continue to follow for timing of potential admission pending bed availability.    Shann Medal, PT, DPT Admissions Coordinator [phone] 09/30/20  10:46 AM

## 2020-10-01 ENCOUNTER — Inpatient Hospital Stay (HOSPITAL_COMMUNITY): Payer: Medicare HMO

## 2020-10-01 DIAGNOSIS — I619 Nontraumatic intracerebral hemorrhage, unspecified: Secondary | ICD-10-CM | POA: Diagnosis not present

## 2020-10-01 LAB — GLUCOSE, CAPILLARY
Glucose-Capillary: 234 mg/dL — ABNORMAL HIGH (ref 70–99)
Glucose-Capillary: 216 mg/dL — ABNORMAL HIGH (ref 70–99)
Glucose-Capillary: 261 mg/dL — ABNORMAL HIGH (ref 70–99)
Glucose-Capillary: 263 mg/dL — ABNORMAL HIGH (ref 70–99)

## 2020-10-01 LAB — CBC WITH DIFFERENTIAL/PLATELET
Abs Immature Granulocytes: 0.03 10*3/uL (ref 0.00–0.07)
Basophils Absolute: 0 10*3/uL (ref 0.0–0.1)
Basophils Relative: 1 %
Eosinophils Absolute: 0.2 10*3/uL (ref 0.0–0.5)
Eosinophils Relative: 3 %
HCT: 33 % — ABNORMAL LOW (ref 36.0–46.0)
Hemoglobin: 10.9 g/dL — ABNORMAL LOW (ref 12.0–15.0)
Immature Granulocytes: 0 %
Lymphocytes Relative: 28 %
Lymphs Abs: 2.4 10*3/uL (ref 0.7–4.0)
MCH: 29.1 pg (ref 26.0–34.0)
MCHC: 33 g/dL (ref 30.0–36.0)
MCV: 88.2 fL (ref 80.0–100.0)
Monocytes Absolute: 0.6 10*3/uL (ref 0.1–1.0)
Monocytes Relative: 7 %
Neutro Abs: 5.3 10*3/uL (ref 1.7–7.7)
Neutrophils Relative %: 61 %
Platelets: 235 10*3/uL (ref 150–400)
RBC: 3.74 MIL/uL — ABNORMAL LOW (ref 3.87–5.11)
RDW: 15.2 % (ref 11.5–15.5)
WBC: 8.6 10*3/uL (ref 4.0–10.5)
nRBC: 0 % (ref 0.0–0.2)

## 2020-10-01 LAB — COMPREHENSIVE METABOLIC PANEL
ALT: 17 U/L (ref 0–44)
AST: 21 U/L (ref 15–41)
Albumin: 2.6 g/dL — ABNORMAL LOW (ref 3.5–5.0)
Alkaline Phosphatase: 42 U/L (ref 38–126)
Anion gap: 5 (ref 5–15)
BUN: 30 mg/dL — ABNORMAL HIGH (ref 8–23)
CO2: 23 mmol/L (ref 22–32)
Calcium: 8.4 mg/dL — ABNORMAL LOW (ref 8.9–10.3)
Chloride: 107 mmol/L (ref 98–111)
Creatinine, Ser: 1.77 mg/dL — ABNORMAL HIGH (ref 0.44–1.00)
GFR, Estimated: 28 mL/min — ABNORMAL LOW (ref >60)
Glucose, Bld: 231 mg/dL — ABNORMAL HIGH (ref 70–99)
Potassium: 4.2 mmol/L (ref 3.5–5.1)
Sodium: 135 mmol/L (ref 135–145)
Total Bilirubin: 0.7 mg/dL (ref 0.3–1.2)
Total Protein: 6.3 g/dL — ABNORMAL LOW (ref 6.5–8.1)

## 2020-10-01 MED ORDER — DICLOFENAC SODIUM 1 % EX GEL
2.0000 g | Freq: Four times a day (QID) | CUTANEOUS | Status: DC
  Administered 2020-10-01 – 2020-10-10 (×26): 2 g via TOPICAL
  Filled 2020-10-01 (×2): qty 100

## 2020-10-01 MED ORDER — INSULIN DETEMIR 100 UNIT/ML ~~LOC~~ SOLN
37.0000 [IU] | Freq: Two times a day (BID) | SUBCUTANEOUS | Status: DC
  Administered 2020-10-01 – 2020-10-02 (×2): 37 [IU] via SUBCUTANEOUS
  Filled 2020-10-01 (×3): qty 0.37

## 2020-10-01 MED ORDER — TRAMADOL HCL 50 MG PO TABS
50.0000 mg | ORAL_TABLET | Freq: Two times a day (BID) | ORAL | Status: DC | PRN
  Administered 2020-10-01 – 2020-10-11 (×8): 50 mg via ORAL
  Filled 2020-10-01 (×10): qty 1

## 2020-10-01 MED ORDER — BLOOD PRESSURE CONTROL BOOK
Freq: Once | Status: AC
  Filled 2020-10-01 (×2): qty 1

## 2020-10-01 MED ORDER — CLONIDINE HCL 0.1 MG PO TABS
0.1000 mg | ORAL_TABLET | Freq: Three times a day (TID) | ORAL | Status: DC | PRN
  Administered 2020-10-04: 0.1 mg via ORAL
  Filled 2020-10-01: qty 1

## 2020-10-01 MED ORDER — LIVING WELL WITH DIABETES BOOK
Freq: Once | Status: AC
  Filled 2020-10-01 (×2): qty 1

## 2020-10-01 NOTE — Progress Notes (Signed)
Patient ID: [name], female   DOB: 1934-11-28, 86 y.o.   MRN: [id number] Met with the patient and family to introduce self and role of the nurse CM. Initiated education on secondary stroke risks including DM A1C 9.9, HTN, HLD (LDL 73 on Zocor- on hold for now) and HF. Patient given handouts on dash diet, food pyramid for kidney disease, HF/Zone tool and insulin management. Continue to follow along to discharge to address education with patient and family. Collaborate with the SW to facilitate preparation for discharge. Margarito Liner, RN

## 2020-10-01 NOTE — Discharge Instructions (Signed)
Inpatient Rehab Discharge Instructions  [name] Discharge date and time: No discharge date for patient encounter.   Activities/Precautions/ Functional Status: Activity: activity as tolerated Diet:  Wound Care: Routine skin checks Functional status:  ___ No restrictions     ___ Walk up steps independently ___ 24/7 supervision/assistance   ___ Walk up steps with assistance ___ Intermittent supervision/assistance  ___ Bathe/dress independently ___ Walk with walker     _x__ Bathe/dress with assistance ___ Walk Independently    ___ Shower independently ___ Walk with assistance    ___ Shower with assistance ___ No alcohol     ___ Return to work/school ________  Special Instructions: No driving smoking or alcohol   My questions have been answered and I understand these instructions. I will adhere to these goals and the provided educational materials after my discharge from the hospital.  Patient/Caregiver Signature _______________________________ Date __________  Clinician Signature _______________________________________ Date __________  Please bring this form and your medication list with you to all your follow-up doctor's appointments.

## 2020-10-01 NOTE — Plan of Care (Signed)
  Problem: RH Balance Goal: LTG Patient will maintain dynamic standing balance (PT) Description: LTG:  Patient will maintain dynamic standing balance with assistance during mobility activities (PT) Flowsheets (Taken 10/01/2020 1251) LTG: Pt will maintain dynamic standing balance during mobility activities with:: Moderate Assistance - Patient 50 - 74%   Problem: Sit to Stand Goal: LTG:  Patient will perform sit to stand with assistance level (PT) Description: LTG:  Patient will perform sit to stand with assistance level (PT) Flowsheets (Taken 10/01/2020 1251) LTG: PT will perform sit to stand in preparation for functional mobility with assistance level: Moderate Assistance - Patient 50 - 74%   Problem: RH Bed Mobility Goal: LTG Patient will perform bed mobility with assist (PT) Description: LTG: Patient will perform bed mobility with assistance, with/without cues (PT). Flowsheets (Taken 10/01/2020 1251) LTG: Pt will perform bed mobility with assistance level of: Minimal Assistance - Patient > 75%   Problem: RH Bed to Chair Transfers Goal: LTG Patient will perform bed/chair transfers w/assist (PT) Description: LTG: Patient will perform bed to chair transfers with assistance (PT). Flowsheets (Taken 10/01/2020 1251) LTG: Pt will perform Bed to Chair Transfers with assistance level: Moderate Assistance - Patient 50 - 74%   Problem: RH Car Transfers Goal: LTG Patient will perform car transfers with assist (PT) Description: LTG: Patient will perform car transfers with assistance (PT). Flowsheets (Taken 10/01/2020 1251) LTG: Pt will perform car transfers with assist:: Moderate Assistance - Patient 50 - 74%   Problem: RH Ambulation Goal: LTG Patient will ambulate in home environment (PT) Description: LTG: Patient will ambulate in home environment, # of feet with assistance (PT). Flowsheets (Taken 10/01/2020 1251) LTG: Pt will ambulate in home environ  assist needed:: Moderate Assistance - Patient  50 - 74% LTG: Ambulation distance in home environment: 10   Problem: RH Wheelchair Mobility Goal: LTG Patient will propel w/c in controlled environment (PT) Description: LTG: Patient will propel wheelchair in controlled environment, # of feet with assist (PT) Flowsheets (Taken 10/01/2020 1251) LTG: Pt will propel w/c in controlled environ  assist needed:: Minimal Assistance - Patient > 75% LTG: Propel w/c distance in controlled environment: 150 Goal: LTG Patient will propel w/c in home environment (PT) Description: LTG: Patient will propel wheelchair in home environment, # of feet with assistance (PT). Flowsheets (Taken 10/01/2020 1251) LTG: Pt will propel w/c in home environ  assist needed:: Minimal Assistance - Patient > 75% LTG: Propel w/c distance in home environment: 50

## 2020-10-01 NOTE — Progress Notes (Signed)
Patient information reviewed and entered into eRehab System by Becky Kamryn Messineo, PPS coordinator. Information including medical coding, function ability, and quality indicators will be reviewed and updated through discharge.   

## 2020-10-01 NOTE — Evaluation (Addendum)
Occupational Therapy Assessment and Plan  Patient Details  Name: [name] MRN: [id number] Date of Birth: 14-Jun-1934  OT Diagnosis: abnormal posture, acute pain, hemiplegia affecting non-dominant side, muscle weakness (generalized) and pain in joint Rehab Potential: Rehab Potential (ACUTE ONLY): Good ELOS: 21-23 days   Today's Date: 10/01/2020 OT Individual Time: 5056-9794 OT Individual Time Calculation (min): 59 min     Hospital Problem: Principal Problem:   Intraparenchymal hemorrhage of brain [facility])   Past Medical History:  Past Medical History:  Diagnosis Date  . Arthritis   . CHF (congestive heart failure) ([place])   . Chronic diastolic heart failure ([place])   . CKD (chronic kidney disease) stage 3, GFR 30-59 ml/min (HCC)   . Essential hypertension   . GERD (gastroesophageal reflux disease)   . Hemorrhoids   . History of stroke   . Type 2 diabetes mellitus ([place])    Past Surgical History:  Past Surgical History:  Procedure Laterality Date  . BACK SURGERY    . CATARACT EXTRACTION W/PHACO Left 02/28/2013   Procedure: CATARACT EXTRACTION PHACO AND INTRAOCULAR LENS PLACEMENT (IOL) CDE=15.60;  Surgeon: Elta Guadeloupe T. Gershon Crane, MD;  Location: [facility] ORS;  Service: Ophthalmology;  Laterality: Left;  . CHOLECYSTECTOMY    . COLONOSCOPY  12/12/2003   RMR: Normal rectum.  Long capacious tortuous colon, colonic mucosa appeared normal  . COLONOSCOPY N/A 03/21/2014   Dr. [name]: internal and external hemorrhoids, torturous colon, colonic diverticulosis   . ESOPHAGOGASTRODUODENOSCOPY  12/28/2001   IAX:KPVVZ sliding hiatal hernia/. Three small bulbar ulcers, two with stigmata of bleeding and these were coagulated using heater probe unit   . ESOPHAGOGASTRODUODENOSCOPY N/A 03/21/2014   Dr. [name]: cervical esophageal web s/p dilation, negative H.pylori   . EYE SURGERY    . HIP FRACTURE SURGERY  2008  . JOINT REPLACEMENT     Rt hip, ????? sounds like just for fracture  . MALONEY DILATION N/A 03/21/2014    Procedure: [name] DILATION;  Surgeon: [name], MD;  Location: [facility] END[facility] SUITE;  Service: Endoscopy;  Laterality: N/A;  . SAVORY DILATION N/A 03/21/2014   Procedure: SAVORY DILATION;  Surgeon: [name], MD;  Location: [facility] END[facility] SU[facility];  Service: Endoscopy;  Laterality: N/A;    Assessment & Plan Clinical Impression: Patient is a 86 y.o. year old female with recent admission to the hospital on 09/24/2020 with acute onset of headache as well as slurred speech.  Denied any double vision.  While in the ED noted some shortness of breath pulmonary edema noted on chest x-ray BiPAP initiated.  Cranial CT scan suspicious for hemorrhage in the posterior fossa.  CT angiogram of head and neck negative for large vessel occlusion.  No vascular abnormalities.  MRI of the brain redemonstrated acute intraparenchymal hemorrhage within the right side of the pons and within the inferior cerebellar vermis.  Small amount of subarachnoid blood in the perimesencephalic cistern, intra foliar subarachnoid spaces of the posterior fossa and dependent within the occipital horn of the lateral ventricles. .  Patient transferred to CIR on 09/30/2020 .    Patient currently requires total with basic self-care skills secondary to muscle weakness and muscle joint tightness, impaired timing and sequencing, unbalanced muscle activation and decreased coordination, decreased attention to left, decreased attention and decreased problem solving and decreased sitting balance, decreased standing balance, decreased postural control, hemiplegia and decreased balance strategies.  Prior to hospitalization, patient could complete ADLs with modified independent .  Patient will benefit from skilled intervention to decrease level  of assist with basic self-care skills and increase independence with basic self-care skills prior to discharge home with care partner.  Anticipate patient will require minimal physical assistance and moderate physical  assestance and follow up home health.  OT - End of Session Activity Tolerance: Decreased this session Endurance Deficit: Yes Endurance Deficit Description: Pt fatigues quickly with return to supine needed to rest and to help resolve dizziness OT Assessment Rehab Potential (ACUTE ONLY): Good OT Patient demonstrates impairments in the following area(s): Balance;Cognition;Endurance;Motor;Pain;Vision;Safety;Perception OT Basic ADL's Functional Problem(s): Grooming;Bathing;Dressing;Toileting OT Transfers Functional Problem(s): Toilet;Tub/Shower OT Additional Impairment(s): Fuctional Use of Upper Extremity OT Plan OT Intensity: Minimum of 1-2 x/day, 45 to 90 minutes OT Frequency: 5 out of 7 days OT Duration/Estimated Length of Stay: 21-23 days OT Treatment/Interventions: Balance/vestibular training;Discharge planning;Pain management;Self Care/advanced ADL retraining;Therapeutic Activities;UE/LE Coordination activities;Functional mobility training;Patient/family education;Therapeutic Exercise;Visual/perceptual remediation/compensation;Community reintegration;DME/adaptive equipment instruction;Neuromuscular re-education;Psychosocial support;UE/LE Strength taining/ROM;Cognitive remediation/compensation OT Self Feeding Anticipated Outcome(s): independent OT Basic Self-Care Anticipated Outcome(s): min assist OT Toileting Anticipated Outcome(s): min assist OT Bathroom Transfers Anticipated Outcome(s): min to mod assist   OT Evaluation Precautions/Restrictions  Precautions Precautions: Fall Precaution Comments: Goal SBP < 160, right LE hip discrepency, knee pain Restrictions Weight Bearing Restrictions: No    Vital Signs Therapy Vitals BP: (!) 154/57 Pain Pain Assessment Pain Scale: Faces Faces Pain Scale: Hurts a little bit Pain Type: Chronic pain Pain Location: Knee Pain Orientation: Right Pain Descriptors / Indicators: Discomfort Pain Onset: With Activity Pain Intervention(s):  Repositioned Multiple Pain Sites: No Home Living/Prior Functioning Home Living Available Help at Discharge: Family,Available 24 hours/day Type of Home: House Home Access: Ramped entrance Home Layout: One level Bathroom Shower/Tub: Multimedia programmer: Handicapped height Bathroom Accessibility: Yes Additional Comments: sleeps in recliner, gets up from [facility] elevated position  Lives With: Son IADL History Homemaking Responsibilities: No Current License: No Occupation: Retired Prior Function Level of Independence: Requires assistive device for independence,Independent with transfers,Independent with basic ADLs Driving: No Vocation: Retired Comments: household ambulator with [place] Baseline Vision/History: No visual deficits Patient Visual Report: No change from baseline Vision Assessment?: Vision impaired- to be further tested in functional context Eye Alignment: Within Functional Limits Perception  Perception: Impaired Inattention/Neglect: Does not attend to left side of body (Needed cueing for initiation of LUE use to ring out washcloth) Praxis Praxis: Intact Cognition Overall Cognitive Status: Impaired/Different from baseline Arousal/Alertness: Awake/alert Orientation Level: Person;Place;Situation Person: Oriented Place: Oriented Situation: Oriented Year: 2022 Month: May Day of Week: Correct Memory: Appears intact Immediate Memory Recall: Sock;Blue;Bed Memory Recall Sock without cueing Bed without cueing Blue without cueing Attention: Focused;Sustained Focused Attention: Appears intact Sustained Attention: Impaired (distracted slightly secondary to increased dizziness) Awareness: Appears intact Sensation Sensation Light Touch: Appears Intact Hot/Cold: Appears Intact Proprioception: Appears Intact Stereognosis: Not tested Additional Comments: light touch and proprioception intact in BUEs Coordination Gross Motor Movements are Fluid and  Coordinated: No Fine Motor Movements are Fluid and Coordinated: No Coordination and Movement Description: Slower movement bilaterally in UEs with slight increased ataxia noted in the LUE compared to the right with finger to nose testing. Motor  Motor Motor: Hemiplegia;Other (comment) (joint tightness in the knees bilaterally) Motor - Skilled Clinical Observations: generalized weakness with slight ataxia in the LUE and orthopedic limitations in hip length as well as knee pain in the RLE.  Trunk/Postural Assessment  Cervical Assessment Cervical Assessment: Exceptions to [facility] (cervical protraction and flexion) Thoracic Assessment Thoracic Assessment: Exceptions to [facility] (thoracic rounding present) Lumbar Assessment Lumbar Assessment: Exceptions to  WFL (posterior pelvic tilt) Postural Control Postural Control: Deficits on evaluation Postural Limitations: slight posterior lean in sitting  Balance Balance Balance Assessed: Yes Static Sitting Balance Static Sitting - Balance Support: Feet supported;Bilateral upper extremity supported Static Sitting - Level of Assistance: 4: Min assist (min contact guard) Dynamic Sitting Balance Dynamic Sitting - Balance Support: Feet unsupported Dynamic Sitting - Level of Assistance: 4: Min assist Extremity/Trunk Assessment RUE Assessment RUE Assessment: Exceptions to [facility] Active Range of Motion (AROM) Comments: WFLS for all joints General Strength Comments: overall 4/5 throughout noted with functional use and gross testing LUE Assessment LUE Assessment: Exceptions to [facility] Active Range of Motion (AROM) Comments: shoulder flexion 0-130 degrees with slight ataxia noted General Strength Comments: overall strength 4/5 throughout in the shoulder and elbow with 3+/5 grip strength  Care Tool Care Tool Self Care Eating   Eating Assist Level: Set up assist    Oral Care    Oral Care Assist Level: Set up assist    Bathing   Body parts bathed by patient:  Chest;Abdomen;Face;Right upper leg;Left upper leg Body parts bathed by helper: Front perineal area;Buttocks;Left lower leg;Right lower leg;Right arm;Left arm   Assist Level: Total Assistance - Patient < 25% (sit to supine)    Upper Body Dressing(including orthotics)   What is the patient wearing?: Pull over shirt   Assist Level: Minimal Assistance - Patient > 75%    Lower Body Dressing (excluding footwear)   What is the patient wearing?: Pants Assist for lower body dressing: Total Assistance - Patient < 25% (bed level)    Putting on/Taking off footwear   What is the patient wearing?: Non-skid slipper socks Assist for footwear: Total Assistance - Patient < 25%       Care Tool Toileting Toileting activity   Assist for toileting: Total Assistance - Patient < 25% (in bed rolling)     Care Tool Bed Mobility Roll left and right activity   Roll left and right assist level: Maximal Assistance - Patient 25 - 49%    Sit to lying activity   Sit to lying assist level: Total Assistance - Patient < 25%    Lying to sitting edge of bed activity   Lying to sitting edge of bed assist level: Total Assistance - Patient < 25%     Care Tool Transfers Sit to stand transfer        Chair/bed transfer         Toilet transfer   Assist Level: Dependent - Patient 0%     Care Tool Cognition Expression of Ideas and Wants Expression of Ideas and Wants: Some difficulty - exhibits some difficulty with expressing needs and ideas (e.g, some words or finishing thoughts) or speech is not clear   Understanding Verbal and Non-Verbal Content Understanding Verbal and Non-Verbal Content: Usually understands - understands most conversations, but misses some part/intent of message. Requires cues at times to understand   Memory/Recall Ability *first 3 days only Memory/Recall Ability *first 3 days only: That he or she is in a hospital/hospital unit    Refer to Care Plan for [place] 1 OT Short Term Goal 1 (Week 1): Pt will complete LB bathing sit to stand with max assist. OT Short Term Goal 2 (Week 1): Pt will complete LB dressing to donn brief with max assist sit to stand. OT Short Term Goal 3 (Week 1): Pt will complete squat pivot or sliding board transfer to the drop arm  commode with max assist. OT Short Term Goal 4 (Week 1): Pt will complete UB bathing in unsupported sitting with supervision.  Recommendations for other services: Therapeutic Recreation  Stress management   Skilled Therapeutic Intervention ADL ADL Eating: Set up Where Assessed-Eating: Bed level Grooming: Minimal assistance Where Assessed-Grooming: Bed level Upper Body Bathing: Minimal assistance Where Assessed-Upper Body Bathing: Edge of bed Lower Body Bathing: Dependent Where Assessed-Lower Body Bathing: Bed level;Edge of bed Upper Body Dressing: Minimal assistance Where Assessed-Upper Body Dressing: Edge of bed Lower Body Dressing: Dependent Where Assessed-Lower Body Dressing: Bed level Toileting: Dependent Where Assessed-Toileting: Bed level Toilet Transfer: Not assessed Tub/Shower Transfer: Not assessed Walk-In Shower Transfer: Not assessed Mobility  Bed Mobility Bed Mobility: Rolling Right;Rolling Left;Supine to Sit;Sit to Supine Rolling Right: Maximal Assistance - Patient 25-49% Rolling Left: Maximal Assistance - Patient 25-49% Supine to Sit: Total Assistance - Patient < 25% Sit to Supine: Total Assistance - Patient < 25%  Session Note:  Pt in bed to start with BP taken at 162/62 in supine with HOB elevated.  She needed total assist for transfer from supine to sit EOB.  Increased posterior bias in sitting with min guard for static sitting  Balance.  Decreased ability to reach either upper arm or shoulder for bathing, which required therapist assist.  She was able to donn her pullover shirt with min assist and increased time to pull it down in the back.  She completed washing her  upper legs while sitting and reported increased dizziness with BP at 184/73. Attempted to scoot up to the top of the bed in preparation for laying down, however she was unable to complete.  Returned to supine with total assist as well as total assist for scooting up to the top of the bed with it in trendelenberg.  She needed max assist for rolling side to side in order for therapist to provide total assist for washing her buttocks.  She was able to wash the front peri area with setup and HOB elevated.  BP meds were given by nursing and BP was taken again at 154/57.  Therapist provided total assist for donning a new brief and pants at total assist level as well rolling side to side.  Finished session with pt in the bed and family in to visit.  She was left working on breakfast with daughter reporting that she would assist her.  Let family and pt know that she will likely need at least min assist 24 hour at discharge.      Discharge Criteria: Patient will be discharged from OT if patient refuses treatment 3 consecutive times without medical reason, if treatment goals not met, if there is a change in medical status, if patient makes no progress towards goals or if patient is discharged from hospital.  The above assessment, treatment plan, treatment alternatives and goals were discussed and mutually agreed upon: by patient and by family     Jadah Bobak OTR/L 10/01/2020, 10:32 AM

## 2020-10-01 NOTE — Progress Notes (Signed)
[facility] Individual Statement of Services  Patient Name:  [name]  Date:  10/01/2020  Welcome to the [facility].  Our goal is to provide you with an individualized program based on your diagnosis and situation, designed to meet your specific needs.  With this comprehensive rehabilitation program, you will be expected to participate in at least 3 hours of rehabilitation therapies Monday-Friday, with modified therapy programming on the weekends.  Your rehabilitation program will include the following services:  Physical Therapy (PT), Occupational Therapy (OT), Speech Therapy (ST), 24 hour per day rehabilitation nursing, Therapeutic Recreaction (TR), Neuropsychology, Care Coordinator, Rehabilitation Medicine, Nutrition Services, Pharmacy Services and Other  Weekly team conferences will be held on Wednesdays to discuss your progress.  Your Inpatient Rehabilitation Care Coordinator will talk with you frequently to get your input and to update you on team discussions.  Team conferences with you and your family in attendance may also be held.  Expected length of stay: 20-24 Days  Overall anticipated outcome: MOD A  Depending on your progress and recovery, your program may change. Your Inpatient Rehabilitation Care Coordinator will coordinate services and will keep you informed of any changes. Your Inpatient Rehabilitation Care Coordinator's name and contact numbers are listed  below.  The following services may also be recommended but are not provided by the [facility]:    [place] will be made to provide these services after discharge if needed.  Arrangements include referral to agencies that provide these services.  Your insurance has been verified to be:  [employer] Your primary doctor is:  [name], MD  Pertinent information will be  shared with your doctor and your insurance company.  Inpatient Rehabilitation Care Coordinator:  Erlene Quan, [place] or [phone]  Information discussed with and copy given to patient by: Dyanne Iha, 10/01/2020, 11:21 AM

## 2020-10-01 NOTE — Progress Notes (Signed)
PROGRESS NOTE   Subjective/Complaints: Discussed findings and rec with pt and daughter  ToT A per OT, pt c/o R>L knee pain with movement  ROS- neg CP, SOB, N/V/D Objective:   No results found. Recent Labs    10/01/20 0456  WBC 8.6  HGB 10.9*  HCT 33.0*  PLT 235   Recent Labs    10/01/20 0456  NA 135  K 4.2  CL 107  CO2 23  GLUCOSE 231*  BUN 30*  CREATININE 1.77*  CALCIUM 8.4*    Intake/Output Summary (Last 24 hours) at 10/01/2020 0843 Last data filed at 10/01/2020 0727 Gross per 24 hour  Intake 200 ml  Output --  Net 200 ml        Physical Exam: Vital Signs Blood pressure (!) 154/66, pulse 73, temperature 98.1 F (36.7 C), temperature source Oral, resp. rate 18, height '5\' 4"'$  (1.626 m), weight 103.8 kg, SpO2 94 %.  General: No acute distress Mood and affect are appropriate Heart: Regular rate and rhythm no rubs murmurs or extra sounds Lungs: Clear to auscultation, breathing unlabored, no rales or wheezes Abdomen: Positive bowel sounds, soft nontender to palpation, nondistended Extremities: No clubbing, cyanosis, or edema Skin: No evidence of breakdown, no evidence of rash Neurologic: Cranial nerves II through XII intact, motor strength is 5/5 in bilateral deltoid, bicep, tricep, grip, hip flexor, knee extensors, ankle dorsiflexor and plantar flexor Sensory exam normal sensation to light touch and proprioception in bilateral upper and lower extremities Cerebellar exam mild dysmetria  finger to nose to finger  bilateral upper and NT in LE due to pain an dreduced ROM  lower extremities Musculoskeletal: Full range of motion in all 4 extremities. No joint swelling, B knee jt line tenderness no effusion     Assessment/Plan: 1. Functional deficits which require 3+ hours per day of interdisciplinary therapy in a comprehensive inpatient rehab setting.  Physiatrist is providing close team supervision and 24  hour management of active medical problems listed below.  Physiatrist and rehab team continue to assess barriers to discharge/monitor patient progress toward functional and medical goals  Care Tool:  Bathing              Bathing assist       Upper Body Dressing/Undressing Upper body dressing   What is the patient wearing?: Hospital gown only    Upper body assist Assist Level: Maximal Assistance - Patient 25 - 49%    Lower Body Dressing/Undressing Lower body dressing      What is the patient wearing?: Hospital gown only     Lower body assist Assist for lower body dressing: 2 Helpers     [place] for toileting: 2 Helpers     Transfers Chair/bed transfer  Transfers assist           Locomotion Ambulation   Ambulation assist              Walk 10 feet activity   Assist           Walk 50 feet activity   Assist           Walk 150 feet  activity   Assist           Walk 10 feet on uneven surface  activity   Assist           Wheelchair     Assist               Wheelchair 50 feet with 2 turns activity    Assist            Wheelchair 150 feet activity     Assist          Blood pressure (!) 154/66, pulse 73, temperature 98.1 F (36.7 C), temperature source Oral, resp. rate 18, height '5\' 4"'$  (1.626 m), weight 103.8 kg, SpO2 94 %.    Medical Problem List and Plan: 1.  Headache with slurred speech and decreased functional mobility secondary to intraparenchymal hemorrhage involving the right pons and cerebellar vermis, SAH as well as history of CVA with residual right-sided weakness             -patient may shower             -ELOS/Goals: 2-3 weeks MinA 2.  Antithrombotics: -DVT/anticoagulation: SCDs             -antiplatelet therapy: N/A 3. Pain Management: Tylenol as needed, Bilateral knee pain likely OA check Xrays will likely need cortisone inj, start  voltaren gel and tramadol  4. Mood: Provide emotional support             -antipsychotic agents: N/A 5. Neuropsych: This patient he is capable of making decisions on her own behalf. 6. Skin/Wound Care: Routine skin checks 7. Fluids/Electrolytes/Nutrition: Routine in and outs with follow-up chemistries 8.  Dysphagia.  Dysphagia #2 thin liquids.  Follow-up speech therapy 9.  Hypertension (systolic elevated, diastolic low).  Continue Norvasc 10 mg daily, Imdur 60 mg daily, hydralazine 100 mg every 8 hours.  Monitor with increased mobility Vitals:   10/01/20 0356 10/01/20 0836  BP: (!) 157/61 (!) 154/66  Pulse: 73   Resp: 18   Temp: 98.1 F (36.7 C)   SpO2: 94%   BPs at rest in desired range [id number] systolic, per daughter with movement BP goes up , pt states that movement causes R>L knee pain  10.  History of atrial fibrillation.  Chronic Eliquis reversed with Andexxa.  Continue to hold blood thinner due to [facility].  Cardiac rate controlled 11.  Diabetes mellitus.  Hemoglobin A1c 9.9.  Increase NovoLog to 5 units 3 times daily, Levemir 35 units twice daily.  Check blood sugars before meals and at bedtime CBG (last 3)  Recent Labs    09/30/20 1618 09/30/20 2129 10/01/20 0608  GLUCAP 234* 190* 234*  uncontrolled increase levimir  12.  Diastolic congestive heart failure.  Monitor for any signs of fluid overload 13.  CKD stage III.  Follow-up chemistries 14.  GERD.  Continue Protonix  LOS: 1 days A FACE TO [place] E Kashlyn Salinas 10/01/2020, 8:43 AM

## 2020-10-01 NOTE — Evaluation (Signed)
Speech Language Pathology Assessment and Plan  Patient Details  Name: [name] MRN: [id number] Date of Birth: 1934-08-29  SLP Diagnosis: Dysarthria;Cognitive Impairments;Dysphagia  Rehab Potential: Good ELOS: 3.5    Today's Date: 10/01/2020 SLP Individual Time: 1430-1530 SLP Individual Time Calculation (min): 60 min   Hospital Problem: Principal Problem:   Intraparenchymal hemorrhage of brain [facility])  Past Medical History:  Past Medical History:  Diagnosis Date  . Arthritis   . CHF (congestive heart failure) ([place])   . Chronic diastolic heart failure ([place])   . CKD (chronic kidney disease) stage 3, GFR 30-59 ml/min (HCC)   . Essential hypertension   . GERD (gastroesophageal reflux disease)   . Hemorrhoids   . History of stroke   . Type 2 diabetes mellitus ([place])    Past Surgical History:  Past Surgical History:  Procedure Laterality Date  . BACK SURGERY    . CATARACT EXTRACTION W/PHACO Left 02/28/2013   Procedure: CATARACT EXTRACTION PHACO AND INTRAOCULAR LENS PLACEMENT (IOL) CDE=15.60;  Surgeon: Elta Guadeloupe T. Gershon Crane, MD;  Location: [facility];  Service: Ophthalmology;  Laterality: Left;  . CHOLECYSTECTOMY    . COLONOSCOPY  12/12/2003   RMR: Normal rectum.  Long capacious tortuous colon, colonic mucosa appeared normal  . COLONOSCOPY N/A 03/21/2014   Dr. [name]: internal and external hemorrhoids, torturous colon, colonic diverticulosis   . ESOPHAGOGASTRODUODENOSCOPY  12/28/2001   SKA:JGOTL sliding hiatal hernia/. Three small bulbar ulcers, two with stigmata of bleeding and these were coagulated using heater probe unit   . ESOPHAGOGASTRODUODENOSCOPY N/A 03/21/2014   Dr. [name]: cervical esophageal web s/p dilation, negative H.pylori   . EYE SURGERY    . HIP FRACTURE SURGERY  2008  . JOINT REPLACEMENT     Rt hip, ????? sounds like just for fracture  . MALONEY DILATION N/A 03/21/2014   Procedure: [name] DILATION;  Surgeon: [name], MD;  Location: [facility];  Service:  Endoscopy;  Laterality: N/A;  . SAVORY DILATION N/A 03/21/2014   Procedure: SAVORY DILATION;  Surgeon: [name], MD;  Location: [facility];  Service: Endoscopy;  Laterality: N/A;    Assessment / Plan / Recommendation Clinical Impression Patient is a 86 y.o. year old female with history of previous stroke with right hemiparesis, chronic atrial fibrillation maintained on Eliquis, diabetes mellitus, diastolic congestive heart failure, CKD stage III. Per chart review she lives with her son. 1 level home ramped entrance. Used a rolling walker prior to admission as well as needing assistance for ADLs. She has multiple family in the area with good support. Presented 09/24/2020 with acute onset of headache as well as slurred speech. Denied any double vision. While in the ED noted some shortness of breath pulmonary edema noted on chest x-ray BiPAP initiated. Cranial CT scan suspicious for hemorrhage in the posterior fossa. CT angiogram of head and neck negative for large vessel occlusion. No vascular abnormalities. MRI of the brain redemonstrated acute intraparenchymal hemorrhage within the right side of the pons and within the inferior cerebellar vermis. Small amount of subarachnoid blood in the perimesencephalic cistern, intra foliar subarachnoid spaces of the posterior fossa and dependent within the occipital horn of the lateral ventricles. No evidence of increasing or new hemorrhage. No hydrocephalus. Admission chemistries unremarkable except potassium 2.3 glucose 217 creatinine 1.58 urine drug screen negative. Echocardiogram with ejection fraction of 55 to 57% grade 2 diastolic dysfunction. Maintained on Cleviprex for blood pressure control. Patient did receive Andexxa to reverse her Eliquis. Maintained on a regular  consistency diet. Due to patient decreased functional mobility and slurred speech she was admitted for a comprehensive rehab program. Medically stable for CIR today.   Pt  presents with mild cognitive linguistic impairments, deficits include short term recall, selective attention and higher level problem solving. Today's evaluation was limited due to urgency to use bedpan, therefore construction task was not completed on Cognistat. Pt demonstrated mild deficits in memory, attention and moderate deficits in calculation, all other subsections were [facility]. Pt presents with moderate-mild impairments in speech primarily due to difficulty coordinating respiration with phonation at word level and mild misarticulation noted at phrase level. Pt presents with mild impairments in swallow function, due to poor coordination of respiration during PO intake and bottom dentures not being present in hospital. Pt demonstrated baseline cough/throat clear with and without PO intake while consuming thin liquids via straw and dys 3 texture trials. Trials were limited due interruptions mentioned above, but oral motor skills WFL. SLP recommends dys 2 textures and thin liquid diet, intermittent supervision to continue assessing tolerance with on going cough. Pt would benefit from skilled ST services in order to maximize functional independence and reduce burden of care, likely requiring supervision at discharge with continued skilled ST services.    Skilled Therapeutic Interventions          Skilled ST services focused on cognitive skills. SLP facilitated administration of cognitive linguistic formal assessment and provided education of results. SLP and pt collaborated to set goals for cognitive linguistic needs during length of stay. All questions answered to satisfaction.  Pt was left in room with call bell within reach and bed alarm set. SLP recommends to continue skilled services.  SLP Assessment  Patient will need skilled Speech Lanaguage Pathology Services during CIR admission    Recommendations  SLP Diet Recommendations: Dysphagia 2 (Fine chop);Thin Liquid Administration via:  Cup;Straw Medication Administration: Whole meds with liquid Supervision: Intermittent supervision to cue for compensatory strategies Compensations: Minimize environmental distractions;Slow rate;Small sips/bites Postural Changes and/or Swallow Maneuvers: Seated upright 90 degrees Oral Care Recommendations: Oral care BID Patient destination: Home Follow up Recommendations: Home Health SLP;Outpatient SLP;24 hour supervision/assistance Equipment Recommended: None recommended by SLP    SLP Frequency 3 to 5 out of 7 days   SLP Duration  SLP Intensity  SLP Treatment/Interventions 3.5  Minumum of 1-2 x/day, 30 to 90 minutes  Cognitive remediation/compensation;Cueing hierarchy;Dysphagia/aspiration precaution training;Functional tasks;Medication managment;Environmental controls;Internal/external aids;Patient/family education    Pain Pain Assessment Pain Score: 0-No pain  Prior Functioning Cognitive/Linguistic Baseline: Information not available Type of Home: House  Lives With: Son Available Help at Discharge: Family;Available 24 hours/day Education: 11th grade Vocation: Retired  Programmer, systems Overall Cognitive Status: Impaired/Different from baseline Arousal/Alertness: Awake/alert Orientation Level: Oriented X4 Attention: Sustained;Selective Focused Attention: Appears intact Sustained Attention: Appears intact Selective Attention: Impaired Selective Attention Impairment: Functional complex;Verbal complex Memory: Impaired Memory Impairment: Decreased short term memory;Decreased recall of new information Awareness: Appears intact Problem Solving: Impaired Problem Solving Impairment: Verbal complex;Functional complex Safety/Judgment: Appears intact  Comprehension Auditory Comprehension Overall Auditory Comprehension: Appears within functional limits for tasks assessed Expression Expression Primary Mode of Expression: Verbal Verbal Expression Overall Verbal  Expression: Appears within functional limits for tasks assessed Oral Motor Oral Motor/Sensory Function Overall Oral Motor/Sensory Function: Within functional limits Motor Speech Overall Motor Speech: Impaired Respiration: Impaired Level of Impairment: Word Phonation: Normal Resonance: Within functional limits Articulation: Impaired Level of Impairment: Phrase Intelligibility: Intelligibility reduced Word: 75-100% accurate Phrase: 75-100% accurate Sentence: 50-74% accurate Motor Planning: Witnin  functional limits Motor Speech Errors: Not applicable  Care Tool Care Tool Cognition Expression of Ideas and Wants Expression of Ideas and Wants: Some difficulty - exhibits some difficulty with expressing needs and ideas (e.g, some words or finishing thoughts) or speech is not clear   Understanding Verbal and Non-Verbal Content Understanding Verbal and Non-Verbal Content: Usually understands - understands most conversations, but misses some part/intent of message. Requires cues at times to understand   Memory/Recall Ability *first 3 days only       PMSV Assessment  PMSV Trial Intelligibility: Intelligibility reduced Word: 75-100% accurate Phrase: 75-100% accurate Sentence: 50-74% accurate  Bedside Swallowing Assessment General Previous Swallow Assessment: None Diet Prior to this Study: Dysphagia 2 (chopped);Thin liquids History of Recent Intubation: No Behavior/Cognition: Alert;Cooperative;Pleasant mood Oral Cavity - Dentition: Missing dentition Self-Feeding Abilities: Needs set up Vision: Functional for self-feeding Patient Positioning: Upright in bed Volitional Swallow: Able to elicit  Oral Care Assessment Does patient have any of the following "high(er) risk" factors?: None of the above Does patient have any of the following "at risk" factors?: Other - dysphagia Patient is AT RISK: Order set for Adult Oral Care Protocol initiated -  "At Risk Patients" option selected (see  row information) Patient is LOW RISK: Follow universal precautions (see row information) Ice Chips Ice chips: Not tested Thin Liquid Thin Liquid: Impaired Presentation: Straw Pharyngeal  Phase Impairments: Throat Clearing - Delayed Nectar Thick Nectar Thick Liquid: Not tested Honey Thick Honey Thick Liquid: Not tested Puree Puree: Not tested Solid Solid: Impaired Presentation: Spoon Oral Phase Impairments: Impaired mastication Oral Phase Functional Implications: Impaired mastication;Prolonged oral transit Pharyngeal Phase Impairments: Throat Clearing - Delayed BSE Assessment Risk for Aspiration Impact on safety and function: Mild aspiration risk Other Related Risk Factors: Cognitive impairment  Short Term Goals: Week 1: SLP Short Term Goal 1 (Week 1): Pt will demonstrate selective attention in mildly distracting evironment for 10 minute intervals with supervision A verbal cues. SLP Short Term Goal 2 (Week 1): Pt will demonstrated recall of novel, daily information with supervision A verbal cues. SLP Short Term Goal 3 (Week 1): Pt will participate in continued assessment of higher lever problem solving skills. SLP Short Term Goal 4 (Week 1): Pt will demonstrate coordination of respiration and phonation at the phrase level with min A verbal cues to achieve 80% intelligibility. SLP Short Term Goal 5 (Week 1): Pt will consume trials of dys 3 textures with appropriate mastication/ coordination of respiration over x3 sessions prior to upgrade. SLP Short Term Goal 6 (Week 1): Pt will self-monitor and self correct articulation errors with supervision A verbal cues.  Refer to Care Plan for Long Term Goals  Recommendations for other services: None   Discharge Criteria: Patient will be discharged from SLP if patient refuses treatment 3 consecutive times without medical reason, if treatment goals not met, if there is a change in medical status, if patient makes no progress towards goals or  if patient is discharged from hospital.  The above assessment, treatment plan, treatment alternatives and goals were discussed and mutually agreed upon: by patient  Cliffard Hair  [facility] 10/01/2020, 4:43 PM

## 2020-10-01 NOTE — Progress Notes (Signed)
Inpatient Rehabilitation Care Coordinator Assessment and Plan Patient Details  Name: [name] MRN: [id number] Date of Birth: March 31, 1935  Today's Date: 10/01/2020  Hospital Problems: Principal Problem:   Intraparenchymal hemorrhage of brain [facility])  Past Medical History:  Past Medical History:  Diagnosis Date  . Arthritis   . CHF (congestive heart failure) ([place])   . Chronic diastolic heart failure ([place])   . CKD (chronic kidney disease) stage 3, GFR 30-59 ml/min (HCC)   . Essential hypertension   . GERD (gastroesophageal reflux disease)   . Hemorrhoids   . History of stroke   . Type 2 diabetes mellitus ([place])    Past Surgical History:  Past Surgical History:  Procedure Laterality Date  . BACK SURGERY    . CATARACT EXTRACTION W/PHACO Left 02/28/2013   Procedure: CATARACT EXTRACTION PHACO AND INTRAOCULAR LENS PLACEMENT (IOL) CDE=15.60;  Surgeon: Elta Guadeloupe T. Gershon Crane, MD;  Location: [facility];  Service: Ophthalmology;  Laterality: Left;  . CHOLECYSTECTOMY    . COLONOSCOPY  12/12/2003   RMR: Normal rectum.  Long capacious tortuous colon, colonic mucosa appeared normal  . COLONOSCOPY N/A 03/21/2014   Dr. [name]: internal and external hemorrhoids, torturous colon, colonic diverticulosis   . ESOPHAGOGASTRODUODENOSCOPY  12/28/2001   ERD:EYCXK sliding hiatal hernia/. Three small bulbar ulcers, two with stigmata of bleeding and these were coagulated using heater probe unit   . ESOPHAGOGASTRODUODENOSCOPY N/A 03/21/2014   Dr. [name]: cervical esophageal web s/p dilation, negative H.pylori   . EYE SURGERY    . HIP FRACTURE SURGERY  2008  . JOINT REPLACEMENT     Rt hip, ????? sounds like just for fracture  . MALONEY DILATION N/A 03/21/2014   Procedure: [name] DILATION;  Surgeon: [name], MD;  [facility];  Service: Endoscopy;  Laterality: N/A;  . SAVORY DILATION N/A 03/21/2014   Procedure: SAVORY DILATION;  Surgeon: [name], MD;  Location[facility];  Service: Endoscopy;   Laterality: N/A;   Social History:  reports that she has never smoked. She has never used smokeless tobacco. She reports that she does not drink alcohol and does not use drugs.  Family / Support Systems Marital Status: Single Children: Aniceto Boss (Dtr), [place] (Dtr), Butch Penny (Dtr), [place] (Granddaughter) Anticipated Caregiver: Aniceto Boss (8 Children) Ability/Limitations of Caregiver: n/a Caregiver Availability: 24/7 Family Dynamics: Pt has a bug family, lots of support  Social History Preferred language: English Religion: Baptist Read: Yes Write: Yes Legal History/Current Legal Issues: n/a Guardian/Conservator: VIRGINIA HERBIN   Abuse/Neglect Abuse/Neglect Assessment Can Be Completed: Yes Physical Abuse: Denies Verbal Abuse: Denies Sexual Abuse: Denies Exploitation of patient/patient's resources: Denies Self-Neglect: Denies  Emotional Status Pt's affect, behavior and adjustment status: n/a Recent Psychosocial Issues: n/a Psychiatric History: n/a Substance Abuse History: n/a  Patient / Family Perceptions, Expectations & Goals Pt/Family understanding of illness & functional limitations: yes Premorbid pt/family roles/activities: Limitied ambulation at baseline with RW. MOD I with mobility and ADLS, WC in the community Anticipated changes in roles/activities/participation: Pt will receive assistance and supervision from family Pt/family expectations/goals: [place]: None Premorbid Home Care/DME Agencies: Other (Comment) Librarian, academic, Engineer, civil (consulting), Wheelchair, Civil engineer, contracting, Engineer, agricultural, Producer, television/film/video, [employer]) Transportation available at discharge: Family able to transport  Discharge Planning Living Arrangements: Children (Lives with son) Support Systems: Children Type of Residence: Private residence (1 level home, ramp entrance) Insurance Resources: Multimedia programmer (specify) ([employer] Medicare) Financial Resources: Family  Support Financial Screen Referred: No Living Expenses: Lives with family  Money Management: Patient Does the patient have any problems obtaining your medications?: No Home Management: Independent Patient/Family Preliminary Plans: Will require supervision/assistance with medications and money manegement Care Coordinator Barriers to Discharge: Insurance for SNF coverage Care Coordinator Anticipated Follow Up Needs: HH/OP Expected length of stay: 20-24 Days  Clinical Impression Sw met with pt and called pt daughter Aniceto Boss) at bedside. Pt daughters Aniceto Boss and [name] will be main points of contact and visitors. Sw provided pt and family with updates on process, no additional questions or concerns. Pt goal to return home with son, 1 level home ramp entrance. Daughters will decide pending pt progress if pt will return with son or move with one of her daughters. Pt has about 8 children, lots of assistance at discharge. Sw will cont to follow up  Dyanne Iha 10/01/2020, 2:11 PM

## 2020-10-01 NOTE — Evaluation (Signed)
Physical Therapy Assessment and Plan  Patient Details  Name: [name] MRN: [id number] Date of Birth: 1934/11/28  PT Diagnosis: Abnormal posture, Abnormality of gait, Cognitive deficits, Contracture of joint: R heel cord, Hemiparesis dominant, Muscle weakness, Osteoarthritis and Pain in R knee Rehab Potential: Good ELOS: 3.5 weeks   Today's Date: 10/01/2020 PT Individual Time: 1100-1200 PT Individual Time Calculation (min): 60 min    Hospital Problem: Principal Problem:   Intraparenchymal hemorrhage of brain [facility])   Past Medical History:  Past Medical History:  Diagnosis Date  . Arthritis   . CHF (congestive heart failure) ([place])   . Chronic diastolic heart failure ([place])   . CKD (chronic kidney disease) stage 3, GFR 30-59 ml/min (HCC)   . Essential hypertension   . GERD (gastroesophageal reflux disease)   . Hemorrhoids   . History of stroke   . Type 2 diabetes mellitus ([place])    Past Surgical History:  Past Surgical History:  Procedure Laterality Date  . BACK SURGERY    . CATARACT EXTRACTION W/PHACO Left 02/28/2013   Procedure: CATARACT EXTRACTION PHACO AND INTRAOCULAR LENS PLACEMENT (IOL) CDE=15.60;  Surgeon: Elta Guadeloupe T. Gershon Crane, MD;  Location: [facility];  Service: Ophthalmology;  Laterality: Left;  . CHOLECYSTECTOMY    . COLONOSCOPY  12/12/2003   RMR: Normal rectum.  Long capacious tortuous colon, colonic mucosa appeared normal  . COLONOSCOPY N/A 03/21/2014   Dr. [name]: internal and external hemorrhoids, torturous colon, colonic diverticulosis   . ESOPHAGOGASTRODUODENOSCOPY  12/28/2001   GMW:NUUVO sliding hiatal hernia/. Three small bulbar ulcers, two with stigmata of bleeding and these were coagulated using heater probe unit   . ESOPHAGOGASTRODUODENOSCOPY N/A 03/21/2014   Dr. [name]: cervical esophageal web s/p dilation, negative H.pylori   . EYE SURGERY    . HIP FRACTURE SURGERY  2008  . JOINT REPLACEMENT     Rt hip, ????? sounds like just for fracture  . MALONEY DILATION  N/A 03/21/2014   Procedure: [name] DILATION;  Surgeon: [name], MD;  Location: [facility];  Service: Endoscopy;  Laterality: N/A;  . SAVORY DILATION N/A 03/21/2014   Procedure: SAVORY DILATION;  Surgeon: [name], MD;  Location: [facility];  Service: Endoscopy;  Laterality: N/A;    Assessment & Plan Clinical Impression: Patient is a 86 y.o. year old female with history of previous stroke with right hemiparesis, chronic atrial fibrillation maintained on Eliquis, diabetes mellitus, diastolic congestive heart failure, CKD stage III.  Per chart review she lives with her son.  1 level home ramped entrance.  Used a rolling walker prior to admission as well as needing assistance for ADLs.  She has multiple family in the area with good support.  Presented 09/24/2020 with acute onset of headache as well as slurred speech.  Denied any double vision.  While in the ED noted some shortness of breath pulmonary edema noted on chest x-ray BiPAP initiated.  Cranial CT scan suspicious for hemorrhage in the posterior fossa.  CT angiogram of head and neck negative for large vessel occlusion.  No vascular abnormalities.  MRI of the brain redemonstrated acute intraparenchymal hemorrhage within the right side of the pons and within the inferior cerebellar vermis.  Small amount of subarachnoid blood in the perimesencephalic cistern, intra foliar subarachnoid spaces of the posterior fossa and dependent within the occipital horn of the lateral ventricles.  No evidence of increasing or new hemorrhage.  No hydrocephalus.  Admission chemistries unremarkable except potassium 2.3 glucose 217 creatinine 1.58 urine  drug screen negative.  Echocardiogram with ejection fraction of 55 to 82% grade 2 diastolic dysfunction.  Maintained on Cleviprex for blood pressure control.  Patient did receive Andexxa to reverse her Eliquis.  Maintained on a regular consistency diet.  Due to patient decreased functional mobility and slurred  speech she was admitted for a comprehensive rehab program. Medically stable for CIR today. Patient currently requires total with mobility secondary to muscle weakness and muscle joint tightness, decreased cardiorespiratoy endurance, decreased attention to left and decreased motor planning, decreased initiation, decreased attention and delayed processing and decreased sitting balance, decreased standing balance, decreased postural control, hemiplegia and decreased balance strategies.  Prior to hospitalization, patient was modified independent  with mobility and lived with Son in a House home.  Home access is  Ramped entrance.  Patient will benefit from skilled PT intervention to maximize safe functional mobility, minimize fall risk and decrease caregiver burden for planned discharge home with 24 hour assist.  Anticipate patient will benefit from follow up [facility] at discharge.  PT - End of Session Activity Tolerance: Tolerates 30+ min activity with multiple rests Endurance Deficit: Yes Endurance Deficit Description: Pt fatigues quickly with return to supine needed to rest and to help resolve dizziness PT Assessment Rehab Potential (ACUTE/IP ONLY): Good PT Patient demonstrates impairments in the following area(s): Balance;Endurance;Motor;Perception;Safety;Sensory;Skin Integrity PT Transfers Functional Problem(s): Bed Mobility;Bed to Chair;Car;Furniture PT Locomotion Functional Problem(s): Ambulation;Wheelchair Mobility PT Plan PT Intensity: Minimum of 1-2 x/day ,45 to 90 minutes PT Frequency: 5 out of 7 days PT Duration Estimated Length of Stay: 3.5 weeks PT Treatment/Interventions: Ambulation/gait training;Discharge planning;Functional mobility training;Psychosocial support;Visual/perceptual remediation/compensation;Therapeutic Activities;Balance/vestibular training;Disease management/prevention;Neuromuscular re-education;Skin care/wound management;Therapeutic Exercise;Wheelchair  propulsion/positioning;Cognitive remediation/compensation;DME/adaptive equipment instruction;Pain management;Splinting/orthotics;UE/LE Strength taining/ROM;Community reintegration;Functional electrical stimulation;Patient/family education;Stair training;UE/LE Coordination activities PT Transfers Anticipated Outcome(s): MinA PT Locomotion Anticipated Outcome(s): ModA PT Recommendation Follow Up Recommendations: Home health PT;24 hour supervision/assistance Patient destination: Home Equipment Recommended: To be determined Equipment Details: owns RW   PT Evaluation Precautions/Restrictions Precautions Precautions: Fall Precaution Comments: Goal SBP < 160, right LE hip discrepency, knee pain Restrictions Weight Bearing Restrictions: No Home Living/Prior Functioning Home Living Living Arrangements: Children (Lives with son) Available Help at Discharge: Family;Available 24 hours/day Type of Home: House Home Access: Ramped entrance Home Layout: One level Bathroom Shower/Tub: Multimedia programmer: Handicapped height Bathroom Accessibility: Yes Additional Comments: sleeps in recliner, gets up from [facility] elevated position  Lives With: Son Prior Function Level of Independence: Requires assistive device for independence;Independent with transfers;Independent with basic ADLs;Needs assistance with homemaking (using RW prior to this admission)  Able to Take Stairs?: No Driving: No Vocation: Retired Comments: household ambulator with RW Vision/Perception  Vision - Assessment Eye Alignment: Within Designer, television/film set Perception: Impaired Inattention/Neglect: Does not attend to left side of body Praxis Praxis: Intact  Cognition Overall Cognitive Status: Impaired/Different from baseline Arousal/Alertness: Awake/alert Orientation Level: Oriented X4 Attention: Focused;Sustained Focused Attention: Appears intact Sustained Attention: Impaired Memory: Appears  intact Immediate Memory Recall: Sock;Blue;Bed Awareness: Appears intact Problem Solving: Impaired Safety/Judgment: Appears intact Sensation Sensation Light Touch: Appears Intact Hot/Cold: Appears Intact Proprioception: Appears Intact Stereognosis: Appears Intact Additional Comments: light touch and proprioception intact in BUEs Coordination Gross Motor Movements are Fluid and Coordinated: No Fine Motor Movements are Fluid and Coordinated: No Coordination and Movement Description: Slower movement bilaterally in UEs with slight increased ataxia noted in the LUE compared to the right with finger to nose testing. Heel Shin Test: significantly decreased ROM B, R>L Motor  Motor Motor: Hemiplegia;Other (comment) Motor -  Skilled Clinical Observations: generalized weakness with slight ataxia in the LUE and orthopedic limitations in hip length as well as knee pain in the RLE.   Trunk/Postural Assessment  Cervical Assessment Cervical Assessment: Exceptions to [facility] (forward head) Thoracic Assessment Thoracic Assessment: Exceptions to [facility] (increased kyphosis) Lumbar Assessment Lumbar Assessment: Exceptions to [facility] (posterior pelvic tilt) Postural Control Postural Control: Deficits on evaluation Righting Reactions: delayed and inadequate Protective Responses: delayed and inadequate Postural Limitations: slight posterior lean in sitting  Balance Balance Balance Assessed: Yes Static Sitting Balance Static Sitting - Balance Support: Feet supported;Bilateral upper extremity supported Static Sitting - Level of Assistance: 4: Min assist Dynamic Sitting Balance Dynamic Sitting - Balance Support: Feet supported;During functional activity Dynamic Sitting - Level of Assistance: 4: Min Insurance risk surveyor Standing - Balance Support: Bilateral upper extremity supported Static Standing - Level of Assistance: 1: +2 Total assist Extremity Assessment  RUE Assessment RUE Assessment:  Exceptions to [facility] Active Range of Motion (AROM) Comments: WFLS for all joints General Strength Comments: overall 4/5 throughout noted with functional use and gross testing LUE Assessment LUE Assessment: Exceptions to [facility] Active Range of Motion (AROM) Comments: shoulder flexion 0-130 degrees with slight ataxia noted General Strength Comments: overall strength 4/5 throughout in the shoulder and elbow with 3+/5 grip strength RLE Assessment RLE Assessment: Exceptions to [facility] RLE PROM (degrees) Right Hip Flexion:  (limited to ~15-20*) Right Knee Extension:  (lacking ~5* full extension) Right Knee Flexion:  (limited to ~30* flexion) Right Ankle Dorsiflexion:  (lacking ~15* from neutral) RLE Strength Right Hip Flexion: 2/5 Right Hip ABduction: 2-/5 Right Hip ADduction: 2/5 Right Knee Flexion: 2-/5 Right Knee Extension: 2-/5 Right Ankle Dorsiflexion: 1/5 Right Ankle Plantar Flexion: 2-/5 LLE Assessment LLE Assessment: Exceptions to WFL LLE PROM (degrees) Left Hip Flexion: limited to ~30* flexion Left Knee Extension: lacking ~5* full extension Left Knee Flexion: limited to ~45*flexion Left Ankle Dorsiflexion:  (lacking ~5* from neutral) LLE Strength Left Hip Flexion: 2+/5 Left Hip ABduction: 3-/5 Left Hip ADduction: 3-/5 Left Knee Flexion: 2+/5 Left Knee Extension: 2+/5 Left Ankle Dorsiflexion: 2+/5 Left Ankle Plantar Flexion: 2+/5  Care Tool Care Tool Bed Mobility Roll left and right activity   Roll left and right assist level: Maximal Assistance - Patient 25 - 49%    Sit to lying activity   Sit to lying assist level: Total Assistance - Patient < 25%    Lying to sitting edge of bed activity   Lying to sitting edge of bed assist level: Total Assistance - Patient < 25%     Care Tool Transfers Sit to stand transfer   Sit to stand assist level: 2 Helpers    Chair/bed transfer   Chair/bed transfer assist level: 2 Armed forces training and education officer transfer activity did not  occur: Safety/medical concerns Assist Level: Dependent - Patient 0%    Scientist, product/process development transfer activity did not occur: Safety/medical concerns        Care Tool Locomotion Ambulation Ambulation activity did not occur: Safety/medical concerns        Walk 10 feet activity Walk 10 feet activity did not occur: Safety/medical concerns       Walk 50 feet with 2 turns activity Walk 50 feet with 2 turns activity did not occur: Safety/medical concerns      Walk 150 feet activity Walk 150 feet activity did not occur: Safety/medical concerns      Walk 10 feet on uneven surfaces activity Walk 10  feet on uneven surfaces activity did not occur: Safety/medical concerns      Stairs Stair activity did not occur: Safety/medical concerns        Walk up/down 1 step activity Walk up/down 1 step or curb (drop down) activity did not occur: Safety/medical concerns     Walk up/down 4 steps activity did not occuR: Safety/medical concerns  Walk up/down 4 steps activity      Walk up/down 12 steps activity Walk up/down 12 steps activity did not occur: Safety/medical concerns      Pick up small objects from floor Pick up small object from the floor (from standing position) activity did not occur: Safety/medical concerns      Wheelchair Will patient use wheelchair at discharge?: Yes Type of Wheelchair: Manual (TBD)   Wheelchair assist level: Dependent - Patient 0% (in TIS wc)    Wheel 50 feet with 2 turns activity      Wheel 150 feet activity        Refer to Care Plan for Long Term Goals  SHORT TERM GOAL WEEK 1 PT Short Term Goal 1 (Week 1): Patient will roll B with ModA and use of bed features as needed PT Short Term Goal 2 (Week 1): Patient will complete supine <> sit with no more than MaxA x1 consistently PT Short Term Goal 3 (Week 1): Patient will complete bed <> wc tranfsers with LRAD and MaxA x2 PT Short Term Goal 4 (Week 1): Patient will maintain static sitting balance EOB with no  more than CGA  Recommendations for other services: None   Skilled Therapeutic Intervention Mobility Bed Mobility Bed Mobility: Rolling Right;Rolling Left;Supine to Sit Rolling Right: Maximal Assistance - Patient 25-49% Rolling Left: Maximal Assistance - Patient 25-49% Supine to Sit: Total Assistance - Patient < 25% Sit to Supine: Total Assistance - Patient < 25% Transfers Transfers: Sit to Stand;Stand to Sit;Lateral/Scoot Transfers Sit to Stand: 2 Helpers Stand to Sit: 2 Helpers Lateral/Scoot Transfers: 2 Press photographer (Assistive device): Other (Comment) (slideboard) Locomotion  Gait Ambulation: No Gait Gait: No Stairs / Additional Locomotion Stairs: No Wheelchair Mobility Wheelchair Mobility: No (in TIS wc)  Patient received reclined in bed, agreeable to PT eval. She denies pain, but endorses fatigue. She reports h/o CVA resulting in R hemiparesis, but was able to ambulate at home using RW. Patient with significantly limited ROM B, R >L with apparent contractures at R ankle and R knee. Patient with general stiffness vs tone in L LE. No clonus, no spasticity. Patient unable to come to stand with +2 TotalA handheld support likely due to both weakness and joint contractures/stiffness. She was able to minimally assist with transfer to wc. She required +2 TotalA via slideboard. Patient remaining up in TIS wc , seatbelt alarm on, call light within reach.   Discharge Criteria: Patient will be discharged from PT if patient refuses treatment 3 consecutive times without medical reason, if treatment goals not met, if there is a change in medical status, if patient makes no progress towards goals or if patient is discharged from hospital.  The above assessment, treatment plan, treatment alternatives and goals were discussed and mutually agreed upon: by patient  Debbora Dus 10/01/2020, 12:42 PM

## 2020-10-02 LAB — GLUCOSE, CAPILLARY
Glucose-Capillary: 230 mg/dL — ABNORMAL HIGH (ref 70–99)
Glucose-Capillary: 310 mg/dL — ABNORMAL HIGH (ref 70–99)
Glucose-Capillary: 277 mg/dL — ABNORMAL HIGH (ref 70–99)
Glucose-Capillary: 259 mg/dL — ABNORMAL HIGH (ref 70–99)

## 2020-10-02 MED ORDER — INSULIN DETEMIR 100 UNIT/ML ~~LOC~~ SOLN
40.0000 [IU] | Freq: Two times a day (BID) | SUBCUTANEOUS | Status: DC
  Administered 2020-10-02 – 2020-10-04 (×4): 40 [IU] via SUBCUTANEOUS
  Filled 2020-10-02 (×5): qty 0.4

## 2020-10-02 NOTE — Progress Notes (Signed)
Physical Therapy Session Note  Patient Details  Name: [name] MRN: [id number] Date of Birth: [birth year]  Today's Date: 10/02/2020 PT Individual Time: 0920-1030 PT Individual Time Calculation (min): 70 min   Short Term Goals: Week 1:  PT Short Term Goal 1 (Week 1): Patient will roll B with ModA and use of bed features as needed PT Short Term Goal 2 (Week 1): Patient will complete supine <> sit with no more than MaxA x1 consistently PT Short Term Goal 3 (Week 1): Patient will complete bed <> wc tranfsers with LRAD and MaxA x2 PT Short Term Goal 4 (Week 1): Patient will maintain static sitting balance EOB with no more than CGA  Skilled Therapeutic Interventions/Progress Updates:    Pt received supine in bed asleep, easily awakens and is agreeable to therapy session. Purewick removed - pt reports she has increased frequency of urination but states she is able to sense the urge to void. Supine>sitting R EOB, HOB elevated and using bedrail, with max assist of 1 for LB management and trunk upright - allowing significant time to initiate and achieve movement - pt continues to demo decreased ROM/flexibliity in B LEs with rigid movement throughout hips to ankles. Sitting EOB, pt demos posterior lean bias at trunk but is able to maintain static balance with CGA and intermittent min assist. Sitting EOB focused on core strengthening and trunk control via cross body reaching to touch external targets - performed with close supervision for safety and no significant LOB reaching minimal distance outside BOS. Noticed pt's bed soiled therefore returned to supine using bed features with max assist for trunk descent and B LE management onto bed. Rolling R/L in bed with max assist and max cuing for sequencing during total assist pericare, LB clothing, and bed linen management with +2 assist. Returned to sitting EOB as above. Sit>stand EOB>+2 3 Musketeer support with +2 total assist to lift into standing - pt able to  clear hips minimally from bed but unable to perform trunk/hip/knee extension to come upright despite attempting 2x -pt reports whole body "pain" with specific pain in R knee likely from therapist having to guard/block it for safety.Transitioned to using RW for sit>stand from EOB with +2 total assist to lift into standing 2x - pt still unable to achieve upright with same flexed posturing and posterior lean. L lateral scoot EOB>w/c with +2 total assist using slide board with pt demoing limited ability to assist with transfer due to decreased trunk mobility and B UE and BLE weakness. Adjusted TIS w/c head rest for improved cervical spine alignment. At end of session pt left sitting tilted back in TIS w/c with needs in reach and seat belt alarm on.  Therapy Documentation Precautions:  Precautions Precautions: Fall Precaution Comments: Goal SBP < 160, right LE hip discrepency, knee pain Restrictions Weight Bearing Restrictions: No  Pain:   Reports whole body pain during sit<>stand transfer but improved with changing technique and increased repetition.    Therapy/Group: Individual Therapy  Tawana Scale , PT, DPT, CSRS  10/02/2020, 8:00 AM

## 2020-10-02 NOTE — Progress Notes (Signed)
Orthopedic Tech Progress Note Patient Details:  [name] 1935/04/29 [id number] Called in order to [employer] for a  BUILT UP SHOE Patient ID: [name], female   DOB: Jun 08, 1934, 86 y.o.   MRN: [id number]   [name] 10/02/2020, 11:57 AM

## 2020-10-02 NOTE — Progress Notes (Signed)
Occupational Therapy Session Note  Patient Details  Name: [name] MRN: [id number] Date of Birth: 1934-06-13  Today's Date: 10/02/2020 OT Individual Time: 1130-1200 OT Individual Time Calculation (min): 30 min    Short Term Goals: Week 1:  OT Short Term Goal 1 (Week 1): Pt will complete LB bathing sit to stand with max assist. OT Short Term Goal 2 (Week 1): Pt will complete LB dressing to donn brief with max assist sit to stand. OT Short Term Goal 3 (Week 1): Pt will complete squat pivot or sliding board transfer to the drop arm commode with max assist. OT Short Term Goal 4 (Week 1): Pt will complete UB bathing in unsupported sitting with supervision.  Skilled Therapeutic Interventions/Progress Updates:    Pt resting in w/c upon arrival with family present. OT intervention with focus on BUE use for peg board tasks. Pt c/o diplopia (vertical) but was able to reach and grasp pegs when placed in all quadrants of vision. Pt used RUE/LUE equally and AROM WNL. Attempted to apply kinesiotape to Lt hand after alcohol used to remove lotion. Unable to adequately apply kinesiotape for edema management. Pt remained in w/c with family present. Belt alarm activated. All needs within reach.  Therapy Documentation Precautions:  Precautions Precautions: Fall Precaution Comments: Goal SBP < 160, right LE hip discrepency, knee pain Restrictions Weight Bearing Restrictions: No  Pain:  Pt denies pain this morning   Therapy/Group: Individual Therapy  Leroy Libman 10/02/2020, 12:14 PM

## 2020-10-02 NOTE — Progress Notes (Signed)
Occupational Therapy Session Note  Patient Details  Name: [name] MRN: [id number] Date of Birth: 17-Apr-1935  Today's Date: 10/02/2020 OT Individual Time: [id number] Individual Time Calculation (min): 24 min    Short Term Goals: Week 1:  OT Short Term Goal 1 (Week 1): Pt will complete LB bathing sit to stand with max assist. OT Short Term Goal 2 (Week 1): Pt will complete LB dressing to donn brief with max assist sit to stand. OT Short Term Goal 3 (Week 1): Pt will complete squat pivot or sliding board transfer to the drop arm commode with max assist. OT Short Term Goal 4 (Week 1): Pt will complete UB bathing in unsupported sitting with supervision.  Skilled Therapeutic Interventions/Progress Updates:    Pt greeted at time of session (not scheduled) for additional OT session and pt agreeable. D/t limited time, pt sat EOB for activity and did not transfer OOB. Supine <> sit Max/total A and Max A for reciprocal scooting to EOB for feet to touch the floor. Max A for sitting balance initially with posterior bias improving to CGA/Supervision within 1-2 minutes. Sitting EOB for oral hygiene set up and face washing same manner. 2x5 FWD reach/pulls sitting EOB for reaching and core strengthening to improve endurance and overall core strength for sitting/improve anterior weight shift. Pt in bed alarm on call bell in reach.   Therapy Documentation Precautions:  Precautions Precautions: Fall Precaution Comments: Goal SBP < 160, right LE hip discrepency, knee pain Restrictions Weight Bearing Restrictions: No     Therapy/Group: Individual Therapy  Viona Gilmore 10/02/2020, 12:11 PM

## 2020-10-02 NOTE — Progress Notes (Signed)
Patient ID: [name], female   DOB: [birth year], 86 y.o.   MRN: [id number] Team Conference Report to Patient/Family  Team Conference discussion was reviewed with the patient and caregiver, including goals, any changes in plan of care and target discharge date.  Patient and caregiver express understanding and are in agreement.  The patient has a target discharge date of  (ELOS 3-3.5 weeks).  Sw met with pt, called daughter [name]) at bedside. Provided conference updates to pt and family. Dtr reports that pt is inct and had a large BM yesterday and was not aware. Dtr would like to know if pt's B&B will improve? Dtr aware to bring pt in socks and sneakers to participate in therapy.  No additional questions or concerns, sw will cont to follow up  Dyanne Iha  10/02/2020, 1:21 PM  [phone]

## 2020-10-02 NOTE — Patient Care Conference (Addendum)
Inpatient RehabilitationTeam Conference and Plan of Care Update Date: 10/02/2020   Time: 10:50 AM    Patient Name: [name]      Medical Record Number: [id number]  Date of Birth: 1934/07/19 Sex: Female         Room/Bed: 4W06C/4W06C-01 Payor Info: Payor: [employer] MEDICARE / Plan: [place] [employer] / Product Type: *No Product type* /    Admit Date/Time:  09/30/2020  6:20 PM  Primary Diagnosis:  Intraparenchymal hemorrhage of brain [facility])  Hospital Problems: Principal Problem:   Intraparenchymal hemorrhage of brain [facility])    Expected Discharge Date: Expected Discharge Date:  (ELOS 3-3.5 weeks)  Team Members Present: Physician leading conference: Dr. Alysia Penna Care Coodinator Present: Dorien Chihuahua, RN, BSN, CRRN;Christina Dover, [place] Nurse Present: Dorien Chihuahua, RN PT Present: Page Spiro, PT;Jennifer Ulice Bold, PT OT Present: Clyda Greener, OT SLP Present: Other (comment) Gunnar Fusi, SLP) PPS Coordinator present : Gunnar Fusi, SLP     Current Status/Progress Goal Weekly Team Focus  Bowel/Bladder   patient is continent in both bowel and bladder, uses purwick due to weakness  Patient will remain continent in both B&B  Regular toileting   Swallow/Nutrition/ Hydration   dys 2 textures and thin liquids  Mod I  trials of dys 3 textures and coordination of respiration   ADL's   Pt currently mod assist for UB bathing with min assist for UB dressing.  total assist for LB selfcare in supine rolling.  Lift for transfers OOB.  slight ataxia in the LUE with functional use.  min to mod assist  selfcare retraining, transfer training, neuromuscular re-education, balance retraining, DME/AE education, therapeutic activites, pt/family education   Mobility   MaxA rolling, TotalA supine <> sit, +2 TotalA STS but unable to really clear bottom from bed, +2 TotalA SBT to TIS wc, severely limited by almost more orthopedic joint contractures than stroke-related s/s  MinA/ModA  bed  mobility, transfers, functional stregnth, sitting balance, gait progressions as able   Communication   min A phrase level due to coordination with respiration and mild articulation  Mod I  breathing and speech startegies   Safety/Cognition/ Behavioral Observations  Min A  Mod I  higher leverl problem solving/attention and recall   Pain   Patient complains of pain on her left shoulder 5/10 which was relieved by tylenol  and reposiitoning last night  Relieve patient's pain and identify the source/cause  Regular pain assessments frequent repositioning and medication   Skin   Patient skin is dry and intact  Maintain healthy skin integrity  Keep patient clean and dry in between elimination, change purwick regularly, repositioning     Discharge Planning:      Team Discussion: Presented with pontine hemorrhage but more limited by pre-existing orthopedic issues/bil knee arthritis than stroke. Leg length discrepancy, right ankle contracture, left UE ataxia and pain limiting patient.  BP fairly well controlled but poor DM control. MD to increase insulin and family education initiated. Using a purewick for bladder control due to urgency but continent of bowel.  Patient on target to meet rehab goals: Currently min - mod assist for upper body care and max assist for lower body care as she is unable to stand due to knee pain. Total assist +2 for sit to EOB and Standing. Total assist for slide-board transfers and has a flexed posture once up and walks on her toes. Min- mod assist goals set for discharge.  *See Care Plan and progress notes for long and short-term  goals.   Revisions to Treatment Plan:  Orthopedic consult requested Request family bring in tennis shoes SLP working on attention, recall, problem solving , breathing exercises and articulation  Teaching Needs: Transfers, toileting, medications, secondary stroke risk management, etc.  Current Barriers to Discharge: Decreased caregiver support  and Home enviroment access/layout  Possible Resolutions to Barriers: Family education     Medical Summary Current Status: severe blateral knee OA, uncontrolled HTN, uncontrolled DM  Barriers to Discharge: Medical stability;Other (comments)  Barriers to Discharge Comments: svere OA B knees Possible Resolutions to [employer] Focus: Med management of OA, may need injection, BP and DM management   Continued Need for Acute Rehabilitation Level of Care: The patient requires daily medical management by a physician with specialized training in physical medicine and rehabilitation for the following reasons: Direction of a multidisciplinary physical rehabilitation program to maximize functional independence : Yes Medical management of patient stability for increased activity during participation in an intensive rehabilitation regime.: Yes Analysis of laboratory values and/or radiology reports with any subsequent need for medication adjustment and/or medical intervention. : Yes   I attest that I was present, lead the team conference, and concur with the assessment and plan of the team.   Dorien Chihuahua B 10/02/2020, 1:21 PM

## 2020-10-02 NOTE — Progress Notes (Signed)
PROGRESS NOTE   Subjective/Complaints: Degenerative changes Bilateral , knees feel a little better with voltaren gel Reviewed xrays both reports and films  ROS- neg CP, SOB, N/V/D Objective:   DG Knee 1-2 Views Left  Result Date: 10/01/2020 CLINICAL DATA:  Chronic bilateral knee pain. EXAM: LEFT KNEE - 1-2 VIEW COMPARISON:  Right knee 10/01/2020 FINDINGS: Two views of the left knee demonstrate severe joint space loss in the medial and lateral knee compartments. Near complete joint space loss in the medial knee compartment. Severe osteophytosis throughout the knee. Extensive degenerative changes at the patellofemoral compartment. Evidence for moderate sized suprapatellar joint effusion. Overall alignment of the knee is normal without a fracture or dislocation. IMPRESSION: Severe joint space loss and osteophytosis throughout the left knee. Findings are compatible with severe osteoarthritis with a joint effusion. No acute bone abnormality. Electronically Signed   By: Markus Daft M.D.   On: 10/01/2020 13:31   DG Knee 1-2 Views Right  Result Date: 10/01/2020 CLINICAL DATA:  Bilateral chronic knee pain. Pain with weight-bearing. EXAM: RIGHT KNEE - 1-2 VIEW COMPARISON:  None. FINDINGS: There is significant joint space narrowing and osteophyte formation involving the MEDIAL and patellofemoral compartments and to a lesser degree the LATERAL compartment. No acute fracture or joint effusion. Note is made of atherosclerotic calcification of popliteal artery. IMPRESSION: Significant degenerative changes. No evidence for acute  abnormality. Electronically Signed   By: Nolon Nations M.D.   On: 10/01/2020 13:30   Recent Labs    10/01/20 0456  WBC 8.6  HGB 10.9*  HCT 33.0*  PLT 235   Recent Labs    10/01/20 0456  NA 135  K 4.2  CL 107  CO2 23  GLUCOSE 231*  BUN 30*  CREATININE 1.77*  CALCIUM 8.4*    Intake/Output Summary (Last 24 hours)  at 10/02/2020 0845 Last data filed at 10/02/2020 0500 Gross per 24 hour  Intake 177 ml  Output 301 ml  Net -124 ml        Physical Exam: Vital Signs Blood pressure (!) 153/67, pulse 62, temperature 98.6 F (37 C), temperature source Oral, resp. rate 18, height _0  (1.626 m), weight 103.8 kg, SpO2 95 %.  General: No acute distress Mood and affect are appropriate Heart: Regular rate and rhythm no rubs murmurs or extra sounds Lungs: Clear to auscultation, breathing unlabored, no rales or wheezes Abdomen: Positive bowel sounds, soft nontender to palpation, nondistended Extremities: No clubbing, cyanosis, or edema Skin: No evidence of breakdown, no evidence of rash Neurologic: Cranial nerves II through XII intact, motor strength is 5/5 in bilateral deltoid, bicep, tricep, grip, hip flexor, knee extensors, ankle dorsiflexor and plantar flexor Sensory exam normal sensation to light touch and proprioception in bilateral upper and lower extremities Cerebellar exam mild dysmetria  finger to nose to finger  bilateral upper and NT in LE due to pain an dreduced ROM  lower extremities Musculoskeletal:Reduce knee flexion Left to 80 deg RIght to ~90 deg No joint swelling, B knee jt line tenderness, mild B effusion     Assessment/Plan: 1. Functional deficits which require 3+ hours per day of interdisciplinary therapy in a comprehensive inpatient rehab setting.  Physiatrist is providing close team supervision and 24 hour management of active medical problems listed below.  Physiatrist and rehab team continue to assess barriers to discharge/monitor patient progress toward functional and medical goals  Care Tool:  Bathing    Body parts bathed by patient: Chest,Abdomen,Face,Right upper leg,Left upper leg   Body parts bathed by helper: Front perineal area,Buttocks,Left lower leg,Right lower leg,Right arm,Left arm     Bathing assist Assist Level: Total Assistance - Patient < 25% (sit to  supine)     Upper Body Dressing/Undressing Upper body dressing   What is the patient wearing?: Pull over shirt    Upper body assist Assist Level: Minimal Assistance - Patient > 75%    Lower Body Dressing/Undressing Lower body dressing      What is the patient wearing?: Pants     Lower body assist Assist for lower body dressing: Total Assistance - Patient < 25% (bed level)     Toileting Toileting    Toileting assist Assist for toileting: Total Assistance - Patient < 25%     Transfers Chair/bed transfer  Transfers assist     Chair/bed transfer assist level: 2 Helpers     Locomotion Ambulation   Ambulation assist   Ambulation activity did not occur: Safety/medical concerns          Walk 10 feet activity   Assist  Walk 10 feet activity did not occur: Safety/medical concerns        Walk 50 feet activity   Assist Walk 50 feet with 2 turns activity did not occur: Safety/medical concerns         Walk 150 feet activity   Assist Walk 150 feet activity did not occur: Safety/medical concerns         Walk 10 feet on uneven surface  activity   Assist Walk 10 feet on uneven surfaces activity did not occur: Safety/medical concerns         Wheelchair     Assist Will patient use wheelchair at discharge?: Yes Type of Wheelchair: Manual (TBD)    Wheelchair assist level: Dependent - Patient 0% (in TIS wc)      Wheelchair 50 feet with 2 turns activity    Assist            Wheelchair 150 feet activity     Assist          Blood pressure (!) 153/67, pulse 62, temperature 98.6 F (37 C), temperature source Oral, resp. rate 18, height $RemoveBe'5\' 4"'aIWRCdXvO$  (1.626 m), weight 103.8 kg, SpO2 95 %.    Medical Problem List and Plan: 1.  Headache with slurred speech and decreased functional mobility secondary to intraparenchymal hemorrhage involving the right pons and cerebellar vermis, SAH as well as history of CVA with residual right-sided  weakness             -patient may shower             -ELOS/Goals: 2-3 weeks MinA Team conference today please see physician documentation under team conference tab, met with team  to discuss problems,progress, and goals. Formulized individual treatment plan based on medical history, underlying problem and comorbidities. Pt appears more impaired by Knee OA than by CVA 2.  Antithrombotics: -DVT/anticoagulation: SCDs             -antiplatelet therapy: N/A 3. Pain Management: Tylenol as needed, Bilateral knee pain likely OA check Xrays will likely need cortisone inj, start voltaren gel and tramadol , end stage with reduced  ROM would benefit from corticosteroid injection but pt would like to hold off for now 4. Mood: Provide emotional support             -antipsychotic agents: N/A 5. Neuropsych: This patient he is capable of making decisions on her own behalf. 6. Skin/Wound Care: Routine skin checks 7. Fluids/Electrolytes/Nutrition: Routine in and outs with follow-up chemistries 8.  Dysphagia.  Dysphagia #2 thin liquids.  Follow-up speech therapy 9.  Hypertension (systolic elevated, diastolic low).  Continue Norvasc 10 mg daily, Imdur 60 mg daily, hydralazine 100 mg every 8 hours.  Monitor with increased mobility Vitals:   10/01/20 1514 10/02/20 0456  BP: (!) 149/62 (!) 153/67  Pulse: 78 62  Resp: 17 18  Temp: 98 F (36.7 C) 98.6 F (37 C)  SpO2: 99% 95%  BPs at rest in desired range <559RCBU systolic, per daughter with movement BP goes up , pt states that movement causes R>L knee pain  10.  History of atrial fibrillation.  Chronic Eliquis reversed with Andexxa.  Continue to hold blood thinner due to [facility].  Cardiac rate controlled 11.  Diabetes mellitus.  Hemoglobin A1c 9.9.  Increase NovoLog to 5 units 3 times daily, Levemir 35 units twice daily.  Check blood sugars before meals and at bedtime CBG (last 3)  Recent Labs    10/01/20 1652 10/01/20 2121 10/02/20 0603  GLUCAP 261* 263* 230*   uncontrolled increase levimir 5/18  12.  Diastolic congestive heart failure.  Monitor for any signs of fluid overload 13.  CKD stage III.  Follow-up chemistries 14.  GERD.  Continue Protonix  LOS: 2 days A FACE TO [place] [facility] Henry Demeritt 10/02/2020, 8:45 AM

## 2020-10-02 NOTE — Progress Notes (Incomplete)
Occupational Therapy Session Note  Patient Details  Name: SHONDEL SCHAAK MRN: [id number] Date of Birth: 10/23/1934  {CHL IP REHAB OT TIME CALCULATIONS:[id number]}   Short Term Goals: Week 1:  OT Short Term Goal 1 (Week 1): Pt will complete LB bathing sit to stand with max assist. OT Short Term Goal 2 (Week 1): Pt will complete LB dressing to donn brief with max assist sit to stand. OT Short Term Goal 3 (Week 1): Pt will complete squat pivot or sliding board transfer to the drop arm commode with max assist. OT Short Term Goal 4 (Week 1): Pt will complete UB bathing in unsupported sitting with supervision.   Skilled Therapeutic Interventions/Progress Updates:      Therapy Documentation Precautions:  Precautions Precautions: Fall Precaution Comments: Goal SBP < 160, right LE hip discrepency, knee pain Restrictions Weight Bearing Restrictions: No     Therapy/Group: Individual Therapy  Viona Gilmore 10/02/2020, 8:21 AM

## 2020-10-02 NOTE — Progress Notes (Signed)
Occupational Therapy Session Note  Patient Details  Name: [name] MRN: [id number] Date of Birth: January 15, 1935  Today's Date: 10/02/2020 OT Individual Time: [id number] OT Individual Time Calculation (min): 55 min    Short Term Goals: Week 1:  OT Short Term Goal 1 (Week 1): Pt will complete LB bathing sit to stand with max assist. OT Short Term Goal 2 (Week 1): Pt will complete LB dressing to donn brief with max assist sit to stand. OT Short Term Goal 3 (Week 1): Pt will complete squat pivot or sliding board transfer to the drop arm commode with max assist. OT Short Term Goal 4 (Week 1): Pt will complete UB bathing in unsupported sitting with supervision.  Skilled Therapeutic Interventions/Progress Updates:    Pt up in the tilt in space wheelchair to start session.  Attempted sit to stand from the TIS space wheelchair with use of the Stedy but unable to complete secondary to needing too much assistance and not being able to achieve upright posture.  She was next transferred down to the ortho gym via wheelchair where she completed standing intervals of 2-3  mins in the standing frame.  She was unable to tolerate full upright standing secondary to right knee and leg pain.  Noted inability to get her right heel down on the ground while standing with mod instructional cueing to try and keep her head up.  She was able to complete 3 intervals with pillow placed in front of her knees to help with padding.  Finished session with return to the room and transfer back to the bed with total +2 (pt 25%) via sliding board.  Call button and phone in reach with safety alarm in place.    Therapy Documentation Precautions:  Precautions Precautions: Fall Precaution Comments: Goal SBP < 160, right LE hip discrepency, knee pain Restrictions Weight Bearing Restrictions: No  Pain: Pain Assessment Pain Scale: Faces Faces Pain Scale: Hurts little more Pain Type: Chronic pain Pain Location: Knee Pain  Orientation: Right Pain Descriptors / Indicators: Discomfort Pain Onset: With Activity Pain Intervention(s): Repositioned ADL: See Care Tool Section for some details of mobility and selfcare  Therapy/Group: Individual Therapy  Jenae Tomasello OTR/L 10/02/2020, 3:44 PM

## 2020-10-03 LAB — GLUCOSE, CAPILLARY
Glucose-Capillary: 260 mg/dL — ABNORMAL HIGH (ref 70–99)
Glucose-Capillary: 169 mg/dL — ABNORMAL HIGH (ref 70–99)
Glucose-Capillary: 202 mg/dL — ABNORMAL HIGH (ref 70–99)
Glucose-Capillary: 246 mg/dL — ABNORMAL HIGH (ref 70–99)

## 2020-10-03 NOTE — Progress Notes (Signed)
Patient ID: [name], female   DOB: 09/12/1934, 86 y.o.   MRN: [id number] Follow up with the patient regarding secondary stroke risks and medication management. Patient noted she was managing her medications and felt like she would be able to continue to do that at discharge. Reviewed secondary stroke risks; HLD (LDL 73) and HTN and DM (A1C 9.9). Patient also has HF and taking diuretics.  Reinforced handouts available for reference if family has questions and tips for cooking with less salt for her son who does the cooking. Continue to follow along to discharge to address educational needs. Margarito Liner, RN

## 2020-10-03 NOTE — Progress Notes (Signed)
Occupational Therapy Session Note  Patient Details  Name: [name] MRN: [id number] Date of Birth: 30-Aug-1934  Today's Date: 10/03/2020 OT Individual Time: 0801-0900 OT Individual Time Calculation (min): 59 min    Short Term Goals: Week 1:  OT Short Term Goal 1 (Week 1): Pt will complete LB bathing sit to stand with max assist. OT Short Term Goal 2 (Week 1): Pt will complete LB dressing to donn brief with max assist sit to stand. OT Short Term Goal 3 (Week 1): Pt will complete squat pivot or sliding board transfer to the drop arm commode with max assist. OT Short Term Goal 4 (Week 1): Pt will complete UB bathing in unsupported sitting with supervision.  Skilled Therapeutic Interventions/Progress Updates:    Pt in bed to start just finishing breakfast.  Had her complete supine to sit with max assist and HOB elevated.  BP taken in supine prior to sitting up at 153/56.  No dizziness reported after sitting up.  She worked on [employer] and dressing from [place].  Close supervision for sitting balance with min assist for UB bathing and min assist for donning pullover shirt.  She still demonstrates decreased ability to reach under the right arm to wash with the LUE and for pulling the shirt down in the back.  Next, she was able to wash her upper legs, but needed assist with lower legs.  She reported difficulty with this at home as well.  Feel she will benefit from AE for washing as well as donning LB clothing.  Returned to supine with max assist and HOB was raised for her to complete washing front peri area.  Transitioned to supine for rolling with max assist sit to side for therapist to provide total assist with washing her buttocks and donning brief and pants.  Total assist also needed for donning gripper socks with pt resting in bed at end of session with PT coming in next.    Therapy Documentation Precautions:  Precautions Precautions: Fall Precaution Comments: Goal SBP < 160, right LE hip  discrepency, knee pain Restrictions Weight Bearing Restrictions: No  Pain: Pain Assessment Pain Scale: Faces Faces Pain Scale: Hurts little more Pain Type: Chronic pain Pain Location: Leg Pain Orientation: Left;Right Pain Descriptors / Indicators: Discomfort Pain Onset: With Activity Pain Intervention(s): Repositioned;Emotional support ADL: See Care Tool Section for some details of mobility and selfcare  Therapy/Group: Individual Therapy  Jakub Debold OTR/L 10/03/2020, 12:11 PM

## 2020-10-03 NOTE — IPOC Note (Signed)
Overall Plan of Care [facility]) Patient Details Name: [name] MRN: [id number] DOB: 12-Jul-1934  Admitting Diagnosis: Intraparenchymal hemorrhage of brain [facility])  Hospital Problems: Principal Problem:   Intraparenchymal hemorrhage of brain ([place])     Functional Problem List: Nursing Bladder,Bowel,Edema,Endurance,Medication Management,Nutrition,Safety,Skin Integrity  PT Balance,Endurance,Motor,Perception,Safety,Sensory,Skin Integrity  OT Balance,Cognition,Endurance,Motor,Pain,Vision,Safety,Perception  SLP    TR         Basic ADL's: OT Grooming,Bathing,Dressing,Toileting     Advanced  ADL's: OT       Transfers: PT Bed Mobility,Bed to Chair,Car,Furniture  OT Toilet,Tub/Shower     Locomotion: PT Ambulation,Wheelchair Mobility     Additional Impairments: OT Fuctional Use of Upper Extremity  SLP Swallowing,Communication,Social Cognition expression Problem Solving,Memory  TR      Anticipated Outcomes Item Anticipated Outcome  Self Feeding independent  Swallowing  Mod I   Basic self-care  min assist  Toileting  min assist   Bathroom Transfers min to mod assist  Bowel/Bladder  Min assist  Transfers  MinA  Locomotion  ModA  Communication  Mod I  Cognition  Mod I  Pain  <3  Safety/Judgment  Mod assist and no falls   Therapy Plan: PT Intensity: Minimum of 1-2 x/day ,45 to 90 minutes PT Frequency: 5 out of 7 days PT Duration Estimated Length of Stay: 3.5 weeks OT Intensity: Minimum of 1-2 x/day, 45 to 90 minutes OT Frequency: 5 out of 7 days OT Duration/Estimated Length of Stay: 21-23 days SLP Intensity: Minumum of 1-2 x/day, 30 to 90 minutes SLP Frequency: 3 to 5 out of 7 days SLP Duration/Estimated Length of Stay: 3.5   Due to the current state of emergency, patients may not be receiving their 3-hours of Medicare-mandated therapy.   Team Interventions: Nursing Interventions Patient/Family Education,Bladder Management,Bowel Management,Disease  Management/Prevention,Pain Management,Medication Management,Dysphagia/Aspiration Precaution Training,Discharge Planning,Psychosocial Support,Skin Care/Wound Management  PT interventions Ambulation/gait training,Discharge planning,Functional mobility training,Psychosocial support,Visual/perceptual remediation/compensation,Therapeutic Activities,Balance/vestibular training,Disease management/prevention,Neuromuscular re-education,Skin care/wound Heritage manager propulsion/positioning,Cognitive remediation/compensation,DME/adaptive equipment instruction,Pain management,Splinting/orthotics,UE/LE Strength taining/ROM,Community reintegration,Functional electrical stimulation,Patient/family education,Stair training,UE/LE Coordination activities  OT Interventions Balance/vestibular training,Discharge planning,Pain management,Self Care/advanced ADL retraining,Therapeutic Activities,UE/LE Coordination activities,Functional mobility training,Patient/family education,Therapeutic Exercise,Visual/perceptual remediation/compensation,Community reintegration,DME/adaptive equipment instruction,Neuromuscular re-education,Psychosocial support,UE/LE Strength taining/ROM,Cognitive remediation/compensation  SLP Interventions Cognitive remediation/compensation,Cueing hierarchy,Dysphagia/aspiration precaution training,Functional tasks,Medication managment,Environmental controls,Internal/external aids,Patient/family education  TR Interventions    SW/CM Interventions Discharge Planning,Psychosocial Support,Patient/Family Education,Disease Management/Prevention   Barriers to Discharge MD  Medical stability and severe bilateral knee OA  Nursing Decreased caregiver support,Home environment access/layout,Incontinence,Lack of/limited family support,Weight,Medication compliance,Behavior,Nutrition means    PT      OT      SLP      SW Insurance for SNF coverage     Team Discharge Planning: Destination:  PT-Home ,OT-   , SLP-Home Projected Follow-up: PT-Home health PT,24 hour supervision/assistance, OT-   , SLP-Home Health SLP,Outpatient SLP,24 hour supervision/assistance Projected Equipment Needs: PT-To be determined, OT-  , SLP-None recommended by SLP Equipment Details: PT-owns RW, OT-  Patient/family involved in discharge planning: PT- Patient,  OT- , SLP-Patient  MD ELOS: 84-17d Medical Rehab Prognosis:  Fair Assessment:  86 year old right-handed female with history of previous stroke with right hemiparesis, chronic atrial fibrillation maintained on Eliquis, diabetes mellitus, diastolic congestive heart failure, CKD stage III.  Per chart review she lives with her son.  1 level home ramped entrance.  Used a rolling walker prior to admission as well as needing assistance for ADLs.  She has multiple family in the area with good support.  Presented 09/24/2020 with acute onset of headache as well as  slurred speech.  Denied any double vision.  While in the ED noted some shortness of breath pulmonary edema noted on chest x-ray BiPAP initiated.  Cranial CT scan suspicious for hemorrhage in the posterior fossa.  CT angiogram of head and neck negative for large vessel occlusion.  No vascular abnormalities.  MRI of the brain redemonstrated acute intraparenchymal hemorrhage within the right side of the pons and within the inferior cerebellar vermis.  Small amount of subarachnoid blood in the perimesencephalic cistern, intra foliar subarachnoid spaces of the posterior fossa and dependent within the occipital horn of the lateral ventricles.  No evidence of increasing or new hemorrhage.  No hydrocephalus.  Admission chemistries unremarkable except potassium 2.3 glucose 217 creatinine 1.58 urine drug screen negative.  Echocardiogram with ejection fraction of 55 to [id number] grade 2 diastolic dysfunction.  Maintained on Cleviprex for blood pressure control.  Patient did receive Andexxa to reverse her Eliquis.  Maintained on a  regular consistency diet.  Due to patient decreased functional mobility and slurred speech she was admitted for a comprehensive rehab program. Medically stable for CIR today.   Now requiring 24/7 Rehab RN,MD, as well as CIR level PT, OT and SLP.  Treatment team will focus on ADLs and mobility with goals set at [facility] See Team Conference Notes for weekly updates to the plan of care

## 2020-10-03 NOTE — Progress Notes (Signed)
Physical Therapy Session Note  Patient Details  Name: [name] MRN: [id number] Date of Birth: 12/20/1934  Today's Date: 10/03/2020 PT Individual Time: [id number] and [id number] PT Individual Time Calculation (min): 27 min and 53 min  Short Term Goals: Week 1:  PT Short Term Goal 1 (Week 1): Patient will roll B with ModA and use of bed features as needed PT Short Term Goal 2 (Week 1): Patient will complete supine <> sit with no more than MaxA x1 consistently PT Short Term Goal 3 (Week 1): Patient will complete bed <> wc tranfsers with LRAD and MaxA x2 PT Short Term Goal 4 (Week 1): Patient will maintain static sitting balance EOB with no more than CGA  Skilled Therapeutic Interventions/Progress Updates:    Session 1: Pt received supine in bed and agreeable to therapy session. Supine>sitting R EOB, HOB partially elevated and using bedrail, with mod assist primarily to rotate pelvis and pt requiring significantly increased time to manage B LEs over to EOB due to stiffness/decreased ROM and impaired strength. Total assist to scoot hips towards EOB with pt demoing impaired motor planning and decreased R/L trunk weight shift. Sitting EOB, participated in trunk control tasks via performing cross body reaching tasks to external targets progressing gradually further outside BOS with pt able to use UE support to maintain balance with supervision. Lateral trunk leans onto elbow with pt returning to midline working on core strengthening with supervision for safety. L lateral scoot EOB>w/c using transfer board with max assist of 1 to place transfer board and then +2 total assist to scoot - cuing for anterior weight shifting. Pt's daughter and granddaughter arriving at this time and educated on pt's CLOF with need for +2 assist and use of transfer board to transfer to w/c. They confirm that prior to hospitalization pt would walk with very crouched posture with trunk, hip, and knees flexed. At end of session, pt  left tilted back in TIS w/c with needs in reach and seat belt alarm on.   Session 2: Pt received supine in bed and agreeable to therapy session. Supine in bed donned pants with total assist to pull over hips and mod assist for rolling into partial sidelying using bedrails. Supine>sitting R EOB, HOB partially elevated and relying heavily on B UE support on bedrail, with min assist for rotating pelvis and cuing for sequencing to increase pt independence. L lateral scoot transfer EOB>TIS w/c using transfer board with max assist for board placement and +2 total assist for scooting hips - cuing for B hand placement to assist with transfer and facilitation for increased anterior trunk lean and B LE WBing as able due to limited knee flexion ROM. Transported to/from gym in w/c for time management and energy conservation. R lateral scoot TIS w/c>EOM using transfer board with same assist as above - continued cuing on technique but due to limited B LE knee flexion ROM and R ankle PF contracture unable to place feet underneath to promote significant amount of weightbearing during transfer. Sit>stand EOM>RW 3x with +2 max/total assist to lift into standing with mirror feedback - demos improved ability to progress towards upright but continues to have significant posterior lean/weight shift with excessive hip/trunk flexion - able to tolerate partial stand for ~20seconds each try with +2 max assist. L lateral scoot EOM>TIS w/c with +2 total assist using transfer board as above. Transported to dayroom. Sitting in TIS w/c performed B LE reciprocal movement patterns and strengthening using Kinetron against level 60cm/sec resistance  increased to 40cm/sec resistance for 15mnute x3 - therapist facilitating improved R LE alignment due to excessive hip external rotation and abduction, pt reports she is unable to feel the muscle activation in R LE like she can in the L.Transported back to room in w/c and pt requesting to return to bed. R  lateral scoot TIS w/c>EOM using transfer board with +2 total assist as above. Sit>supine with +2 total assist for trunk descent and B LE management onto bed. Pt left supine in bed with needs in reach, HOB elevated, and bed alarm on.   Therapy Documentation Precautions:  Precautions Precautions: Fall Precaution Comments: Goal SBP < 160, right LE hip discrepency, knee pain Restrictions Weight Bearing Restrictions: No  Pain: Session 1: Continues to report B LE knee pain but declines any intervention at this time.  Session 2: Continues to report B LE knee pain, predominately in standing - therapist reposition and adjusted LE positioning and limiting standing/WBing to avoid increased pain during session.   Therapy/Group: Individual Therapy  CTawana Scale, PT, DPT, CSRS  10/03/2020, 7:45 AM

## 2020-10-03 NOTE — Progress Notes (Signed)
PROGRESS NOTE   Subjective/Complaints: Knee feel a little better with voltaren gel, orthotist assessing RLE for built up shoe ROS- neg CP, SOB, N/V/D Objective:   DG Knee 1-2 Views Left  Result Date: 10/01/2020 CLINICAL DATA:  Chronic bilateral knee pain. EXAM: LEFT KNEE - 1-2 VIEW COMPARISON:  Right knee 10/01/2020 FINDINGS: Two views of the left knee demonstrate severe joint space loss in the medial and lateral knee compartments. Near complete joint space loss in the medial knee compartment. Severe osteophytosis throughout the knee. Extensive degenerative changes at the patellofemoral compartment. Evidence for moderate sized suprapatellar joint effusion. Overall alignment of the knee is normal without a fracture or dislocation. IMPRESSION: Severe joint space loss and osteophytosis throughout the left knee. Findings are compatible with severe osteoarthritis with a joint effusion. No acute bone abnormality. Electronically Signed   By: Markus Daft M.D.   On: 10/01/2020 13:31   DG Knee 1-2 Views Right  Result Date: 10/01/2020 CLINICAL DATA:  Bilateral chronic knee pain. Pain with weight-bearing. EXAM: RIGHT KNEE - 1-2 VIEW COMPARISON:  None. FINDINGS: There is significant joint space narrowing and osteophyte formation involving the MEDIAL and patellofemoral compartments and to a lesser degree the LATERAL compartment. No acute fracture or joint effusion. Note is made of atherosclerotic calcification of popliteal artery. IMPRESSION: Significant degenerative changes. No evidence for acute  abnormality. Electronically Signed   By: Nolon Nations M.D.   On: 10/01/2020 13:30   Recent Labs    10/01/20 0456  WBC 8.6  HGB 10.9*  HCT 33.0*  PLT 235   Recent Labs    10/01/20 0456  NA 135  K 4.2  CL 107  CO2 23  GLUCOSE 231*  BUN 30*  CREATININE 1.77*  CALCIUM 8.4*    Intake/Output Summary (Last 24 hours) at 10/03/2020 0828 Last data  filed at 10/03/2020 0500 Gross per 24 hour  Intake 720 ml  Output 900 ml  Net -180 ml        Physical Exam: Vital Signs Blood pressure (!) 159/62, pulse 69, temperature 98.7 F (37.1 C), temperature source Oral, resp. rate 18, height '5\' 4"'$  (1.626 m), weight 103.8 kg, SpO2 94 %.   General: No acute distress Mood and affect are appropriate Heart: Regular rate and rhythm no rubs murmurs or extra sounds Lungs: Clear to auscultation, breathing unlabored, no rales or wheezes Abdomen: Positive bowel sounds, soft nontender to palpation, nondistended Extremities: No clubbing, cyanosis, or edema Skin: No evidence of breakdown, no evidence of rash   Neurologic:  motor strength is 5/5 in bilateral deltoid, bicep, tricep, grip, 3- hip flexor, knee extensors, ankle dorsiflexor and plantar flexor limited by reduce ROM and pain    Cerebellar exam mild dysmetria  finger to nose to finger  bilateral upper and NT in LE due to pain an dreduced ROM  lower extremities Musculoskeletal:Reduced knee ext -15% deg ,  knee flexion Left to 80 deg RIght to ~90 deg No joint swelling, B knee jt line tenderness, mild B effusion     Assessment/Plan: 1. Functional deficits which require 3+ hours per day of interdisciplinary therapy in a comprehensive inpatient rehab setting.  Physiatrist is providing close  team supervision and 24 hour management of active medical problems listed below.  Physiatrist and rehab team continue to assess barriers to discharge/monitor patient progress toward functional and medical goals  Care Tool:  Bathing    Body parts bathed by patient: Chest,Abdomen,Face,Right upper leg,Left upper leg   Body parts bathed by helper: Front perineal area,Buttocks,Left lower leg,Right lower leg,Right arm,Left arm     Bathing assist Assist Level: Total Assistance - Patient < 25% (sit to supine)     Upper Body Dressing/Undressing Upper body dressing   What is the patient wearing?: Pull over  shirt    Upper body assist Assist Level: Minimal Assistance - Patient > 75%    Lower Body Dressing/Undressing Lower body dressing      What is the patient wearing?: Pants     Lower body assist Assist for lower body dressing: Total Assistance - Patient < 25% (bed level)     Toileting Toileting    Toileting assist Assist for toileting: Total Assistance - Patient < 25%     Transfers Chair/bed transfer  Transfers assist     Chair/bed transfer assist level: 2 Helpers Chair/bed transfer assistive device: Sliding board   Locomotion Ambulation   Ambulation assist   Ambulation activity did not occur: Safety/medical concerns          Walk 10 feet activity   Assist  Walk 10 feet activity did not occur: Safety/medical concerns        Walk 50 feet activity   Assist Walk 50 feet with 2 turns activity did not occur: Safety/medical concerns         Walk 150 feet activity   Assist Walk 150 feet activity did not occur: Safety/medical concerns         Walk 10 feet on uneven surface  activity   Assist Walk 10 feet on uneven surfaces activity did not occur: Safety/medical concerns         Wheelchair     Assist Will patient use wheelchair at discharge?: Yes Type of Wheelchair: Manual (TBD)    Wheelchair assist level: Dependent - Patient 0% (in TIS wc)      Wheelchair 50 feet with 2 turns activity    Assist            Wheelchair 150 feet activity     Assist          Blood pressure (!) 159/62, pulse 69, temperature 98.7 F (37.1 C), temperature source Oral, resp. rate 18, height '5\' 4"'$  (1.626 m), weight 103.8 kg, SpO2 94 %.    Medical Problem List and Plan: 1.  Headache with slurred speech and decreased functional mobility secondary to intraparenchymal hemorrhage involving the right pons and cerebellar vermis, SAH as well as history of CVA with residual right-sided weakness             -patient may shower              -ELOS/Goals: 2-3 weeks   Pt appears more impaired by Knee OA than by CVA 2.  Antithrombotics: -DVT/anticoagulation: SCDs             -antiplatelet therapy: N/A 3. Pain Management: Tylenol as needed, Bilateral knee pain likely OA check Xrays will likely need cortisone inj, start voltaren gel and tramadol , end stage with reduced ROM would benefit from corticosteroid injection but pt would like to hold off for now 4. Mood: Provide emotional support             -  antipsychotic agents: N/A 5. Neuropsych: This patient he is capable of making decisions on her own behalf. 6. Skin/Wound Care: Routine skin checks 7. Fluids/Electrolytes/Nutrition: Routine in and outs with follow-up chemistries 8.  Dysphagia.  Dysphagia #2 thin liquids.  Follow-up speech therapy 9.  Hypertension (systolic elevated, diastolic low).  Continue Norvasc 10 mg daily, Imdur 60 mg daily, hydralazine 100 mg every 8 hours.  Monitor with increased mobility Vitals:   10/02/20 1937 10/03/20 0421  BP: (!) 174/54 (!) 159/62  Pulse: 74 69  Resp: 18 18  Temp: 97.7 F (36.5 C) 98.7 F (37.1 C)  SpO2: 96% 94%   10.  History of atrial fibrillation.  Chronic Eliquis reversed with Andexxa.  Continue to hold blood thinner due to [facility].  Cardiac rate controlled 11.  Diabetes mellitus.  Hemoglobin A1c 9.9.  Increase NovoLog to 5 units 3 times daily, Levemir 35 units twice daily.  Check blood sugars before meals and at bedtime CBG (last 3)  Recent Labs    10/02/20 1611 10/02/20 2140 10/03/20 0601  GLUCAP 277* 259* 260*  uncontrolled increase levimir 5/20- now 40U BID  12.  Diastolic congestive heart failure.  Monitor for any signs of fluid overload 13.  CKD stage III.  Follow-up chemistries 14.  GERD.  Continue Protonix  LOS: 3 days A FACE TO FACE EVALUATION WAS PERFORMED  [name] 10/03/2020, 8:28 AM

## 2020-10-03 NOTE — Progress Notes (Signed)
Speech Language Pathology Daily Session Note  Patient Details  Name: [name] MRN: [id number] Date of Birth: 1934/12/19  Today's Date: 10/03/2020 SLP Individual Time: [id number] SLP Individual Time Calculation (min): 45 min  Short Term Goals: Week 1: SLP Short Term Goal 1 (Week 1): Pt will demonstrate selective attention in mildly distracting evironment for 10 minute intervals with supervision A verbal cues. SLP Short Term Goal 2 (Week 1): Pt will demonstrated recall of novel, daily information with supervision A verbal cues. SLP Short Term Goal 3 (Week 1): Pt will participate in continued assessment of higher lever problem solving skills. SLP Short Term Goal 4 (Week 1): Pt will demonstrate coordination of respiration and phonation at the phrase level with min A verbal cues to achieve 80% intelligibility. SLP Short Term Goal 5 (Week 1): Pt will consume trials of dys 3 textures with appropriate mastication/ coordination of respiration over x3 sessions prior to upgrade. SLP Short Term Goal 6 (Week 1): Pt will self-monitor and self correct articulation errors with supervision A verbal cues.  Skilled Therapeutic Interventions:   Patient seen for skilled ST session with daughter present for portion of session. Patient was alert, sitting up in wheelchair when SLP arrived. She participated in completing the [place] from [place]. She was able to complete 1/3 designs without cues when given extra time due to decreased fine motor control with manipulating tiles. She demonstrated good problem solving, attention but required min-modA cues to perform 2/3 designs. Patient then participated in trial of approximated diaphragmatic breathing exercise, continuous pursed lipped exhalation with average duration of 3-4 seconds and patient requiring modA cues to perform. Patient continues to benefit from skilled SLP intervention to maximize cognitive-linguistic, speech and swallow function prior  to discharge.  Pain Pain Assessment Pain Scale: 0-10 Faces Pain Scale: No hurt  Therapy/Group: Individual Therapy  Sonia Baller, MA, CCC-SLP Speech Therapy

## 2020-10-04 LAB — GLUCOSE, CAPILLARY
Glucose-Capillary: 196 mg/dL — ABNORMAL HIGH (ref 70–99)
Glucose-Capillary: 238 mg/dL — ABNORMAL HIGH (ref 70–99)
Glucose-Capillary: 196 mg/dL — ABNORMAL HIGH (ref 70–99)
Glucose-Capillary: 186 mg/dL — ABNORMAL HIGH (ref 70–99)

## 2020-10-04 MED ORDER — INSULIN DETEMIR 100 UNIT/ML ~~LOC~~ SOLN
42.0000 [IU] | Freq: Two times a day (BID) | SUBCUTANEOUS | Status: DC
  Administered 2020-10-04 – 2020-10-07 (×6): 42 [IU] via SUBCUTANEOUS
  Filled 2020-10-04 (×7): qty 0.42

## 2020-10-04 NOTE — Progress Notes (Signed)
Occupational Therapy Session Note  Patient Details  Name: [name] MRN: [id number] Date of Birth: 1934/08/03  Today's Date: 10/04/2020 OT Individual Time: 1115-1200 OT Individual Time Calculation (min): 45 min    Short Term Goals: Week 1:  OT Short Term Goal 1 (Week 1): Pt will complete LB bathing sit to stand with max assist. OT Short Term Goal 2 (Week 1): Pt will complete LB dressing to donn brief with max assist sit to stand. OT Short Term Goal 3 (Week 1): Pt will complete squat pivot or sliding board transfer to the drop arm commode with max assist. OT Short Term Goal 4 (Week 1): Pt will complete UB bathing in unsupported sitting with supervision.  Skilled Therapeutic Interventions/Progress Updates:    Pt resting in TIS w/c upon arrival and agreeable to going to gym for therapy. Seated BITS visual scanning single target. Pt reached forward to touch target alternating with RUE/LUE. 3x1 min; 2.89, 2.53, and 2.25 reaction. Pt also engaged in tapping beach ball with 2# bar-4x10. Pt returned to room and remained in w/c with all needs within reach. Belt alarm activated.   Therapy Documentation Precautions:  Precautions Precautions: Fall Precaution Comments: Goal SBP < 160, right LE hip discrepency, knee pain Restrictions Weight Bearing Restrictions: No   Vital Signs: Therapy Vitals Pulse Rate: 65 BP: (!) 155/52 Patient Position (if appropriate): Sitting Pain: Pain Assessment Pain Scale: 0-10 Pain Score: 0-No pain  Therapy/Group: Individual Therapy  Leroy Libman 10/04/2020, 12:07 PM

## 2020-10-04 NOTE — Progress Notes (Signed)
Occupational Therapy Session Note  Patient Details  Name: [name] MRN: [id number] Date of Birth: 1935-04-20  Today's Date: 10/04/2020 OT Individual Time: [id number] OT Individual Time Calculation (min): 30 min    Short Term Goals: Week 1:  OT Short Term Goal 1 (Week 1): Pt will complete LB bathing sit to stand with max assist. OT Short Term Goal 2 (Week 1): Pt will complete LB dressing to donn brief with max assist sit to stand. OT Short Term Goal 3 (Week 1): Pt will complete squat pivot or sliding board transfer to the drop arm commode with max assist. OT Short Term Goal 4 (Week 1): Pt will complete UB bathing in unsupported sitting with supervision.  Skilled Therapeutic Interventions/Progress Updates:    Pt resting in TIS w/c upon arrival and greeted therapist appropriately. OT intervention with focus on BUE function and 9 hole peg test. Pt initially engaged in reaching tasks while seated in TIS. Pt able to reach outside of BOS. Limited shoulder flexion to ~110 degrees. Pt completed 9 hole peg test with BUE: Rt-1'06" Lt-1'16". Pt remained in w/c. BP-165/55 and 161/52. Charge Nurse [employer] notifed.   Therapy Documentation Precautions:  Precautions Precautions: Fall Precaution Comments: Goal SBP < 160, right LE hip discrepency, knee pain Restrictions Weight Bearing Restrictions: No Pain: Pain Assessment Pain Scale: 0-10 Pain Score: 0-No pain   Therapy/Group: Individual Therapy  Leroy Libman 10/04/2020, 12:08 PM

## 2020-10-04 NOTE — Progress Notes (Addendum)
PROGRESS NOTE   Subjective/Complaints: No issues overnite, knee pain increased with standing, comfortable at rest  ROS- neg CP, SOB, N/V/D Objective:   No results found. No results for input(s): WBC, HGB, HCT, PLT in the last 72 hours. No results for input(s): NA, K, CL, CO2, GLUCOSE, BUN, CREATININE, CALCIUM in the last 72 hours.  Intake/Output Summary (Last 24 hours) at 10/04/2020 0818 Last data filed at 10/03/2020 1411 Gross per 24 hour  Intake 177 ml  Output --  Net 177 ml        Physical Exam: Vital Signs Blood pressure (!) 166/59, pulse 66, temperature 98.8 F (37.1 C), temperature source Oral, resp. rate 16, height '5\' 4"'$  (1.626 m), weight 103.8 kg, SpO2 99 %.  General: No acute distress Mood and affect are appropriate Heart: Regular rate and rhythm no rubs murmurs or extra sounds Lungs: Clear to auscultation, breathing unlabored, no rales or wheezes Abdomen: Positive bowel sounds, soft nontender to palpation, nondistended Extremities: No clubbing, cyanosis, or edema Skin: No evidence of breakdown, no evidence of rash  Neurologic:  motor strength is 5/5 in bilateral deltoid, bicep, tricep, grip, 3- hip flexor, knee extensors, ankle dorsiflexor and plantar flexor limited by reduce ROM and pain    Cerebellar exam mild dysmetria  finger to nose to finger  bilateral upper and NT in LE due to pain an dreduced ROM  lower extremities Musculoskeletal:Reduced knee ext -15% deg ,  knee flexion Left to 80 deg RIght to ~90 deg No joint swelling, B knee jt line tenderness, mild B effusion     Assessment/Plan: 1. Functional deficits which require 3+ hours per day of interdisciplinary therapy in a comprehensive inpatient rehab setting.  Physiatrist is providing close team supervision and 24 hour management of active medical problems listed below.  Physiatrist and rehab team continue to assess barriers to discharge/monitor  patient progress toward functional and medical goals  Care Tool:  Bathing    Body parts bathed by patient: Chest,Abdomen,Face,Right upper leg,Left upper leg,Left arm,Right arm,Front perineal area   Body parts bathed by helper: Right lower leg,Left lower leg,Buttocks     Bathing assist Assist Level: Moderate Assistance - Patient 50 - 74% (sit to supine)     Upper Body Dressing/Undressing Upper body dressing   What is the patient wearing?: Pull over shirt    Upper body assist Assist Level: Minimal Assistance - Patient > 75%    Lower Body Dressing/Undressing Lower body dressing      What is the patient wearing?: Pants,Incontinence brief     Lower body assist Assist for lower body dressing: Total Assistance - Patient < 25% (total assist in bed rolling side to side)     Toileting Toileting    Toileting assist Assist for toileting: Total Assistance - Patient < 25%     Transfers Chair/bed transfer  Transfers assist     Chair/bed transfer assist level: 2 Helpers (+2 total assist lateral scoot) Chair/bed transfer assistive device: Sliding board   Locomotion Ambulation   Ambulation assist   Ambulation activity did not occur: Safety/medical concerns          Walk 10 feet activity   Assist  Walk 10 feet activity did not occur: Safety/medical concerns        Walk 50 feet activity   Assist Walk 50 feet with 2 turns activity did not occur: Safety/medical concerns         Walk 150 feet activity   Assist Walk 150 feet activity did not occur: Safety/medical concerns         Walk 10 feet on uneven surface  activity   Assist Walk 10 feet on uneven surfaces activity did not occur: Safety/medical concerns         Wheelchair     Assist Will patient use wheelchair at discharge?: Yes Type of Wheelchair: Manual (TBD)    Wheelchair assist level: Dependent - Patient 0% (in TIS wc)      Wheelchair 50 feet with 2 turns  activity    Assist            Wheelchair 150 feet activity     Assist          Blood pressure (!) 166/59, pulse 66, temperature 98.8 F (37.1 C), temperature source Oral, resp. rate 16, height '5\' 4"'$  (1.626 m), weight 103.8 kg, SpO2 99 %.    Medical Problem List and Plan: 1.  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  Antithrombotics: -DVT/anticoagulation: SCDs             -antiplatelet therapy: N/A 3. Pain Management: Tylenol as needed, Bilateral knee pain likely OA check Xrays will likely need cortisone inj, start voltaren gel and tramadol , end stage with reduced ROM would benefit from corticosteroid injection but pt would like to hold off for now 4. Mood: Provide emotional support             -antipsychotic agents: N/A 5. Neuropsych: This patient he is capable of making decisions on her own behalf. 6. Skin/Wound Care: Routine skin checks 7. Fluids/Electrolytes/Nutrition: Routine in and outs with follow-up chemistries 8.  Dysphagia.  Dysphagia #2 thin liquids.  Follow-up speech therapy 9.  Hypertension (systolic elevated, diastolic low).  Continue Norvasc 10 mg daily, Imdur 60 mg daily, hydralazine 100 mg every 8 hours.  Monitor with increased mobility Vitals:   10/04/20 0414 10/04/20 0418  BP: (!) 157/56 (!) 166/59  Pulse: 70 66  Resp: 16   Temp: 98.8 F (37.1 C)   SpO2: 99% 99%  check ortho vitals 10.  History of atrial fibrillation.  Chronic Eliquis reversed with Andexxa.  Continue to hold blood thinner due to [facility].  Cardiac rate controlled 11.  Diabetes mellitus.  Hemoglobin A1c 9.9.  Increase NovoLog to 5 units 3 times daily, Levemir 35 units twice daily.  Check blood  sugars before meals and at bedtime CBG (last 3)  Recent Labs    10/03/20 1700 10/03/20 2111 10/04/20 0621  GLUCAP 202* 246* 196*  uncontrolled increase levimir 5/20- now 40U BID- increase to 42U   12.  Diastolic congestive heart failure.  No signs of fluid overload 13.  CKD stage III.  Follow-up chemistries 14.  GERD.  Continue Protonix  LOS: 4 days A FACE TO FACE EVALUATION WAS PERFORMED  [name] 10/04/2020, 8:18 AM

## 2020-10-04 NOTE — Progress Notes (Signed)
Physical Therapy Session Note  Patient Details  Name: [name] MRN: [id number] Date of Birth: January 16, 1935  Today's Date: 10/04/2020 PT Individual Time: 0800-0858; 1300-1400 PT Individual Time Calculation (min): 58 min and 60 mins  Short Term Goals: Week 1:  PT Short Term Goal 1 (Week 1): Patient will roll B with ModA and use of bed features as needed PT Short Term Goal 2 (Week 1): Patient will complete supine <> sit with no more than MaxA x1 consistently PT Short Term Goal 3 (Week 1): Patient will complete bed <> wc tranfsers with LRAD and MaxA x2 PT Short Term Goal 4 (Week 1): Patient will maintain static sitting balance EOB with no more than CGA  Skilled Therapeutic Interventions/Progress Updates:    Session 1: Patient received reclined in bed, agreeable to PT. She denies pain at rest, but reports up to 8/10 pain in B knees, L>R when weight bearing, premedicated. PT providing rest breaks, distractions and repositioning to assist with pain management. Patient donning pants at bedlevel with MaxA. MaxA to come sit edge of bed. Posterior bias in sitting, but no LOB noted. ModA to don clean shirt while seated edge of bed. Patient able to lean R to allow placement of slideboard with MinA for lean, but was unable to assist with placement of board. TotalA x2 slideboard transfer to wc with verbal cues to maintain anterior weight shift. PT donning socks/sneakers TotalA. PT transporting patient in wc to therapy gym for time management and energy conservation. MD in;out for assessment and discussed use of heel wedges/built up shoe to assist with mainly R ankle contracture. Patients current sneakers allow for slightly better ankle positioning in weight bearing (due to larger heel) than when she just has socks on, but ankle position remains less than ideal. Patient completing multiple sit > stands. Initially with HHA MaxA x2 from [place], 2 trials. Patient unable to clear bottom from wc. Trialed sit > stand with  RW + MaxA x2 and patient able to clear bottom more, but was still not at a functional posture and was unable to maintain for longer than ~5s. 2 trials with RW. Then trialed Clarise Cruz lift sit <> stand x2. Lift able to assist patients bottom off of chair, but due to profound glute weakness and hip flexor tightness, patient unable to maintain pelvic neutral or even attempt to achieve pelvic neutral for longer than ~10s. Patient also reporting B knee pain as limiting factor to how long she can stand. Trialed resisted therex with light weight (1.5# ankle weights) LAQ 2x10. Patient reports "some" increase in pain with resisted exercises as well, to B knees. Patient returning to room in wc, seatbelt alarm on, call light within reach.   Session 2: Patient received supine in bed, agreeable to PT. She denies pain, but endorses fatigue. She was able to come sit edge of bed with MaxA and use of bedrails. TotalAx2 slideboard transfer to wc. PT transporting patient in wc to therapy gym for time management and energy conservation. Slideboard transfer TotalAx2 to therapy mat. Dynamic sitting balance completed unsupported edge of mat boxing with B UE and trunk rotation. Patient completed this 30s x5 with extended rest break between. Patient denies SOB, but noted to have labored breathing with exertion. O2 >98% throughout session. Patient completing isometric lat push downs and tricep push ups assisting with B LE on ground in preparation for scooting. Patient with good anterior weight shift initially, but would then return to posterior bias. She was responsive  to cues to maintain forward weight shift throughout task. TotalA x2 to return supine in mat. PT providing passive stretch to B knees ankles with minimal improvements in ROM and at times increase in report of pain with transition from flex >extend. Patient returning to sitting edge of mat with TotalA x1. Slideboard transfer to wc TotalA x2 and then TotalA x2 slideboard transfer  to bed and return to supine. Bed alarm on, call light within reach.   Therapy Documentation Precautions:  Precautions Precautions: Fall Precaution Comments: Goal SBP < 160, right LE hip discrepency, knee pain Restrictions Weight Bearing Restrictions: No    Therapy/Group: Individual Therapy  Karoline Caldwell, PT, DPT, CBIS  10/04/2020, 7:41 AM

## 2020-10-05 DIAGNOSIS — Z794 Long term (current) use of insulin: Secondary | ICD-10-CM

## 2020-10-05 DIAGNOSIS — E1165 Type 2 diabetes mellitus with hyperglycemia: Secondary | ICD-10-CM

## 2020-10-05 DIAGNOSIS — K5901 Slow transit constipation: Secondary | ICD-10-CM

## 2020-10-05 DIAGNOSIS — I10 Essential (primary) hypertension: Secondary | ICD-10-CM

## 2020-10-05 DIAGNOSIS — N1832 Chronic kidney disease, stage 3b: Secondary | ICD-10-CM

## 2020-10-05 LAB — GLUCOSE, CAPILLARY
Glucose-Capillary: 252 mg/dL — ABNORMAL HIGH (ref 70–99)
Glucose-Capillary: 182 mg/dL — ABNORMAL HIGH (ref 70–99)
Glucose-Capillary: 252 mg/dL — ABNORMAL HIGH (ref 70–99)
Glucose-Capillary: 214 mg/dL — ABNORMAL HIGH (ref 70–99)

## 2020-10-05 MED ORDER — CLONIDINE HCL 0.1 MG PO TABS
0.1000 mg | ORAL_TABLET | Freq: Two times a day (BID) | ORAL | Status: DC
  Administered 2020-10-05 – 2020-10-09 (×8): 0.1 mg via ORAL
  Filled 2020-10-05 (×10): qty 1

## 2020-10-05 MED ORDER — POLYETHYLENE GLYCOL 3350 17 G PO PACK
17.0000 g | PACK | Freq: Every day | ORAL | Status: DC | PRN
  Administered 2020-10-09: 17 g via ORAL
  Filled 2020-10-05: qty 1

## 2020-10-05 NOTE — Progress Notes (Signed)
Occupational Therapy Session Note  Patient Details  Name: [name] MRN: [id number] Date of Birth: Aug 03, 1934  Today's Date: 10/05/2020 OT Individual Time: 1430-1500 OT Individual Time Calculation (min): 30 min    Short Term Goals: Week 1:  OT Short Term Goal 1 (Week 1): Pt will complete LB bathing sit to stand with max assist. OT Short Term Goal 2 (Week 1): Pt will complete LB dressing to donn brief with max assist sit to stand. OT Short Term Goal 3 (Week 1): Pt will complete squat pivot or sliding board transfer to the drop arm commode with max assist. OT Short Term Goal 4 (Week 1): Pt will complete UB bathing in unsupported sitting with supervision.  Skilled Therapeutic Interventions/Progress Updates:    1;1. Pt received in TIS at beginning of sesion ready to go. Focus of session on trunk control, core strengthening, and UB strengthening. 2x5 w/c sit up and lateral leans seated away from back of TIS with CGA for lateral movements and MIN manual resistance against sit ups. Pt completes 1x10 UB dowel therex shoulder flex/ext, chest press, overhead press and bicep curl with 3# dowel rod to improve BUE strengthening/endurance. +2 SB transfer back to bed and +2 for bed mobility.  Exited session with pt seated in bed, exit alarm on and call light in reach   Therapy Documentation Precautions:  Precautions Precautions: Fall Precaution Comments: Goal SBP < 160, right LE hip discrepency, knee pain Restrictions Weight Bearing Restrictions: No General:   Vital Signs: Therapy Vitals Temp: 98.6 F (37 C) Temp Source: Oral Pulse Rate: 64 Resp: 16 BP: (!) 151/55 Patient Position (if appropriate): Lying Oxygen Therapy SpO2: 94 % O2 Device: Room Air Pain:   ADL: ADL Eating: Set up Where Assessed-Eating: Bed level Grooming: Minimal assistance Where Assessed-Grooming: Bed level Upper Body Bathing: Minimal assistance Where Assessed-Upper Body Bathing: Edge of bed Lower Body Bathing:  Dependent Where Assessed-Lower Body Bathing: Bed level,Edge of bed Upper Body Dressing: Minimal assistance Where Assessed-Upper Body Dressing: Edge of bed Lower Body Dressing: Dependent Where Assessed-Lower Body Dressing: Bed level Toileting: Dependent Where Assessed-Toileting: Bed level Toilet Transfer: Not assessed Tub/Shower Transfer: Not assessed Gaffer Transfer: Not assessed Vision   Perception    Praxis   Exercises:   Other Treatments:     Therapy/Group: Individual Therapy  Tonny Branch 10/05/2020, 6:53 AM

## 2020-10-05 NOTE — Progress Notes (Signed)
Physical Therapy Session Note  Patient Details  Name: [name] MRN: [id number] Date of Birth: 1934/08/29  Today's Date: 10/05/2020 PT Individual Time: [id number] PT Individual Time Calculation (min): 55 min   Short Term Goals: Week 1:  PT Short Term Goal 1 (Week 1): Patient will roll B with ModA and use of bed features as needed PT Short Term Goal 2 (Week 1): Patient will complete supine <> sit with no more than MaxA x1 consistently PT Short Term Goal 3 (Week 1): Patient will complete bed <> wc tranfsers with LRAD and MaxA x2 PT Short Term Goal 4 (Week 1): Patient will maintain static sitting balance EOB with no more than CGA  Skilled Therapeutic Interventions/Progress Updates:    Pt received supine in bed and agreeable to therapy session. Supine>sitting R EOB, HOB slightly elevated and relying heavily on bedrail, with mod assist for pivoting hips and bringing trunk upright - requires increased time for movements due to them being slow and through small ROMs due to B LE ROM limitations. Sitting EOB, assessed vitals: BP 187/66 (MAP 99), HR 81bpm - RN notified and present during session for medication administration. L lateral scoot transfer EOB>TIS w/c using transfer board, pt able to perform R lateral lean and lift L pelvis to allow board placement by therapist, able to advance to completing the scoot with total assist of 1 requiring significant number of scoots to move hips over into w/c- cuing for anterior trunk weight shift and placement of UEs to improve pt's assist with scooting (continues to be limited in ability to use LEs to assist with this type of transfer due to limited B knee flexion ROM preventing pt from getting her feet under her).  Transported to/from gym in w/c for time management and energy conservation. R lateral scoot transfer w/c>EOM using transfer board with assist and cuing as above; however, pt not able to weight shift and lift R hip as well as L during board placement.  Reassessed vitals: BP 164/65 (MAP 95), HR 75bpm  Sitting EOM, donned litegait harness to assist with coming to stand and promote B LE weightbearing as pt unable to tolerates standing frame or Clarise Cruz due to B LE knee pain. Sit>stand EOM>litegait harness total assist - pt reports some pain with B knee WBing but reports improved compared to other machine supported standing. Progressed to gait training ~62f overground in litegait harness providing significant BWS with +2 assist to manage harness system - able to advance each LE with very short steps sliding forward on ground due to inability to clear foot. Pt reports this task as being very challenging. Doffed litegait harness. Seated B LE long arc quads 2x15 reps against 5lb ankle weight. Attempted sit>stand TIS w/c>RW 3x with heavy max assist of 1 for lifting to stand and pt only able to slightly clear hips from seat and then tolerate standing ~5 seconds. Pt transported back to room and agreeable to remain sitting up in TIS w/c - left tilted back with needs in reach and seat belt alarm on.  Therapy Documentation Precautions:  Precautions Precautions: Fall Precaution Comments: Goal SBP < 160, right LE hip discrepency, knee pain Restrictions Weight Bearing Restrictions: No  Pain:   Reports B knee pain with WBing though improved when not using lifting device that causes pressure to the knee joint - provided rest break and modification of intervention for pain management.    Therapy/Group: Individual Therapy  CTawana Scale, PT, DPT, CSRS  10/05/2020, 12:41  PM  

## 2020-10-05 NOTE — Progress Notes (Signed)
PROGRESS NOTE   Subjective/Complaints: Patient seen laying in bed this morning.  She states she slept well overnight.  Discussed constipation with nursing.  ROS: Denies CP, SOB, N/V/D  Objective:   No results found. No results for input(s): WBC, HGB, HCT, PLT in the last 72 hours. No results for input(s): NA, K, CL, CO2, GLUCOSE, BUN, CREATININE, CALCIUM in the last 72 hours.  Intake/Output Summary (Last 24 hours) at 10/05/2020 1038 Last data filed at 10/04/2020 1249 Gross per 24 hour  Intake 177 ml  Output --  Net 177 ml        Physical Exam: Vital Signs Blood pressure (!) 180/75, pulse 64, temperature 98.6 F (37 C), temperature source Oral, resp. rate 16, height '5\' 4"'$  (1.626 m), weight 103.8 kg, SpO2 94 %. Constitutional: No distress . Vital signs reviewed. HENT: Normocephalic.  Atraumatic. Eyes: EOMI. No discharge. Cardiovascular: No JVD.  RRR. Respiratory: Normal effort.  No stridor.  Bilateral clear to auscultation. GI: Non-distended.  BS +. Skin: Warm and dry.  Intact. Psych: Normal mood.  Normal behavior. Musc: No edema in extremities.  No tenderness in extremities. Neuro: Alert Motor: Grossly 4/5 throughout, right knee somewhat more limited  Assessment/Plan: 1. Functional deficits which require 3+ hours per day of interdisciplinary therapy in a comprehensive inpatient rehab setting.  Physiatrist is providing close team supervision and 24 hour management of active medical problems listed below.  Physiatrist and rehab team continue to assess barriers to discharge/monitor patient progress toward functional and medical goals  Care Tool:  Bathing    Body parts bathed by patient: Chest,Abdomen,Face,Right upper leg,Left upper leg,Left arm,Right arm,Front perineal area   Body parts bathed by helper: Right lower leg,Left lower leg,Buttocks     Bathing assist Assist Level: Moderate Assistance - Patient 50 -  74% (sit to supine)     Upper Body Dressing/Undressing Upper body dressing   What is the patient wearing?: Pull over shirt    Upper body assist Assist Level: Minimal Assistance - Patient > 75%    Lower Body Dressing/Undressing Lower body dressing      What is the patient wearing?: Pants,Incontinence brief     Lower body assist Assist for lower body dressing: Total Assistance - Patient < 25% (total assist in bed rolling side to side)     Toileting Toileting    Toileting assist Assist for toileting: Total Assistance - Patient < 25%     Transfers Chair/bed transfer  Transfers assist     Chair/bed transfer assist level: 2 Helpers Chair/bed transfer assistive device: Sliding board   Locomotion Ambulation   Ambulation assist   Ambulation activity did not occur: Safety/medical concerns          Walk 10 feet activity   Assist  Walk 10 feet activity did not occur: Safety/medical concerns        Walk 50 feet activity   Assist Walk 50 feet with 2 turns activity did not occur: Safety/medical concerns         Walk 150 feet activity   Assist Walk 150 feet activity did not occur: Safety/medical concerns         Walk 10 feet on  uneven surface  activity   Assist Walk 10 feet on uneven surfaces activity did not occur: Safety/medical concerns         Wheelchair     Assist Will patient use wheelchair at discharge?: Yes Type of Wheelchair: Manual (TBD)    Wheelchair assist level: Dependent - Patient 0% (in TIS wc)      Wheelchair 50 feet with 2 turns activity    Assist        Assist Level: Dependent - Patient 0%   Wheelchair 150 feet activity     Assist          Blood pressure (!) 180/75, pulse 64, temperature 98.6 F (37 C), temperature source Oral, resp. rate 16, height '5\' 4"'$  (1.626 m), weight 103.8 kg, SpO2 94 %.    Medical Problem List and Plan: 1.  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  Antithrombotics: -DVT/anticoagulation: SCDs             -antiplatelet therapy: N/A 3. Pain Management: Tylenol as needed, Bilateral knee pain likely OA check Xrays will likely need cortisone inj, start voltaren gel and tramadol , end stage with reduced ROM would benefit from corticosteroid injection but pt would like to hold off for now  Relatively controlled with meds on 5/21 4. Mood: Provide emotional support             -antipsychotic agents: N/A 5. Neuropsych: This patient he is capable of making decisions on her own behalf. 6. Skin/Wound Care: Routine skin checks 7. Fluids/Electrolytes/Nutrition: Routine in and outs 8.  Dysphagia.  Dysphagia #2 thin liquids.  Follow-up speech therapy 9.  Hypertension:   Continue Norvasc 10 mg daily, Imdur 60 mg daily, hydralazine 100 mg every 8 hours.  Monitor with increased mobility  Clonidine started on 5/21 Vitals:   10/05/20 0440 10/05/20 0830  BP: (!) 151/55 (!) 180/75  Pulse: 64   Resp: 16   Temp: 98.6 F (37 C)   SpO2: 94%    Orthostatics negative on 5/21 10.  History of atrial fibrillation.  Chronic Eliquis reversed with Andexxa.  Continue to hold blood thinner due to [facility].  Cardiac rate controlled 11.  Diabetes mellitus with hyperglycemia.  Hemoglobin A1c 9.9.    Increase NovoLog to 5 units 3 times daily  Check blood sugars before meals and at bedtime CBG (last 3)  Recent Labs    10/04/20 1655 10/04/20 2119 10/05/20 0605  GLUCAP 196* 186* 252*   Levimir increased to 42U on 5/20  Remains elevated, will not adjust medications given recent increase 12.  Diastolic congestive heart failure.  No signs of fluid overload Filed Weights   09/30/20 1823  Weight: 103.8 kg   13.  CKD stage IIIb.    Creatinine 1.77 on 5/17, continue to monitor 14.  GERD.  Continue  Protonix 15.  Slow transit constipation  Bowel meds increased on 5/21  LOS: 5 days A FACE TO FACE EVALUATION WAS PERFORMED  Masiah Lewing Lorie Phenix 10/05/2020, 10:38 AM

## 2020-10-05 NOTE — Progress Notes (Signed)
Speech Language Pathology Daily Session Note  Patient Details  Name: DANEKA MCNEAR MRN: [id number] Date of Birth: July 22, 1934  Today's Date: 10/05/2020 SLP Individual Time: [id number] SLP Individual Time Calculation (min): 55 min  Short Term Goals: Week 1: SLP Short Term Goal 1 (Week 1): Pt will demonstrate selective attention in mildly distracting evironment for 10 minute intervals with supervision A verbal cues. SLP Short Term Goal 2 (Week 1): Pt will demonstrated recall of novel, daily information with supervision A verbal cues. SLP Short Term Goal 3 (Week 1): Pt will participate in continued assessment of higher lever problem solving skills. SLP Short Term Goal 4 (Week 1): Pt will demonstrate coordination of respiration and phonation at the phrase level with min A verbal cues to achieve 80% intelligibility. SLP Short Term Goal 5 (Week 1): Pt will consume trials of dys 3 textures with appropriate mastication/ coordination of respiration over x3 sessions prior to upgrade. SLP Short Term Goal 6 (Week 1): Pt will self-monitor and self correct articulation errors with supervision A verbal cues.  Skilled Therapeutic Interventions: Skilled treatment session focused on cognitive and communication goals. SLP facilitated session by providing extra time and overall Mod A question and visual cues for problem solving during a 4-step picture sequencing task. Patient also verbally descried the actions within the pictures with Min-Mod A multimodal cues needed for diaphragmatic breathing and coordination of phonation on exhalation. Patient demonstrated increased difficulty with coordination of speech during structured breathing tasks and sustained phonemes. Patient left upright in the wheelchair with alarm on and all needs within reach. Continue with current plan of care.      Pain Pain Assessment Pain Scale: 0-10 Pain Score: 0-No pain  Therapy/Group: Individual Therapy  Rayn Enderson 10/05/2020, 10:46  AM

## 2020-10-05 NOTE — Progress Notes (Signed)
Occupational Therapy Session Note  Patient Details  Name: [name] MRN: [id number] Date of Birth: 1934/07/18  Today's Date: 10/05/2020 OT Individual Time: 0034-9179 OT Individual Time Calculation (min): 58 min    Short Term Goals: Week 1:  OT Short Term Goal 1 (Week 1): Pt will complete LB bathing sit to stand with max assist. OT Short Term Goal 2 (Week 1): Pt will complete LB dressing to donn brief with max assist sit to stand. OT Short Term Goal 3 (Week 1): Pt will complete squat pivot or sliding board transfer to the drop arm commode with max assist. OT Short Term Goal 4 (Week 1): Pt will complete UB bathing in unsupported sitting with supervision.  Skilled Therapeutic Interventions/Progress Updates:    Pt received semi-reclined in bed, agreeable to therapy. C/o mild neck ache 2/2 sleeping in a poor position. Req to use bed pan, total A to roll R/L in bed to apply bed pan. Unable to void. Total A + 2 for assistance to don pants and brief at bed level. Came to sitting EOB total A to progress BLE off bed and to lift trunk, pt initially req max A to maintain static sitting balance, but progressing to CGA with RUE support on bed rail. BP initially reading at 189/62 (94). RN made aware and administered BP medications during session. Pt denies additional sx. SB transfer > TIS total A + 2. Pt is able to reach towards L arm rest to assist. To target improved BUE/BLE/core strength in prep for improved functional mobility, pt completed 1x10 of forward/backward circles, B shoulder flexion, beach volley ball with 2 lb dowel rod. Pt with difficulties incorporating core twist to accurately aim ball to her R/L. Additionally, played kickball bowling with BLE, pt req increased number of attempts with R > L to knock down all of the pins. BP after light seated activity read at 162/57 (80).   Pt left in TIS with safety belt alarm engaged, call bell in reach, and all immediate needs met.    Therapy  Documentation Precautions:  Precautions Precautions: Fall Precaution Comments: Goal SBP < 160, right LE hip discrepency, knee pain Restrictions Weight Bearing Restrictions: No Pain: see session note   ADL: See Care Tool for more details.   Therapy/Group: Individual Therapy  Volanda Napoleon MS, OTR/L  10/05/2020, 6:47 AM

## 2020-10-06 LAB — GLUCOSE, CAPILLARY
Glucose-Capillary: 206 mg/dL — ABNORMAL HIGH (ref 70–99)
Glucose-Capillary: 202 mg/dL — ABNORMAL HIGH (ref 70–99)
Glucose-Capillary: 177 mg/dL — ABNORMAL HIGH (ref 70–99)
Glucose-Capillary: 192 mg/dL — ABNORMAL HIGH (ref 70–99)

## 2020-10-07 LAB — GLUCOSE, CAPILLARY
Glucose-Capillary: 177 mg/dL — ABNORMAL HIGH (ref 70–99)
Glucose-Capillary: 163 mg/dL — ABNORMAL HIGH (ref 70–99)
Glucose-Capillary: 263 mg/dL — ABNORMAL HIGH (ref 70–99)
Glucose-Capillary: 185 mg/dL — ABNORMAL HIGH (ref 70–99)

## 2020-10-07 MED ORDER — INSULIN DETEMIR 100 UNIT/ML ~~LOC~~ SOLN
44.0000 [IU] | Freq: Two times a day (BID) | SUBCUTANEOUS | Status: DC
  Administered 2020-10-07 – 2020-10-10 (×7): 44 [IU] via SUBCUTANEOUS
  Filled 2020-10-07 (×10): qty 0.44

## 2020-10-07 NOTE — Progress Notes (Signed)
Occupational Therapy Session Note  Patient Details  Name: [name] MRN: [id number] Date of Birth: January 23, 1935  Today's Date: 10/07/2020 OT Individual Time: 1012-1101 OT Individual Time Calculation (min): 49 min    Short Term Goals: Week 1:  OT Short Term Goal 1 (Week 1): Pt will complete LB bathing sit to stand with max assist. OT Short Term Goal 2 (Week 1): Pt will complete LB dressing to donn brief with max assist sit to stand. OT Short Term Goal 3 (Week 1): Pt will complete squat pivot or sliding board transfer to the drop arm commode with max assist. OT Short Term Goal 4 (Week 1): Pt will complete UB bathing in unsupported sitting with supervision.  Skilled Therapeutic Interventions/Progress Updates:    Pt completed bathing and dressing supine to sit EOB.  She was able to complete washing her upper legs with setup in bed.  Mod assist for rolling in supine for donning pants with total assist for donning over her LEs as well as for pulling them up over her hips.  She needed total assist for washing lower legs and feet as well with mod assist transfer to the EOB from supine to work on UB bathing.  She was able to complete  UB bathing with min assist as well as donning pullover shirt.  Finished session with total assist sliding board transfer to the wheelchair where she completed combing her hair and brushing her teeth with setup.  She was left sitting in the wheelchair with the call button and phone in reach and safety belt in place.    Therapy Documentation Precautions:  Precautions Precautions: Fall Precaution Comments: Goal SBP < 160, right LE hip discrepency, knee pain Restrictions Weight Bearing Restrictions: No  Pain: Pain Assessment Pain Scale: Faces Pain Score: 0-No pain Pain Type: Chronic pain Pain Location: Knee Pain Orientation: Right;Left Pain Descriptors / Indicators: Aching;Discomfort Pain Intervention(s): Repositioned ADL: See Care Tool Section for some details  of mobility and selfcare  Therapy/Group: Individual Therapy  Issabella Rix OTR/L 10/07/2020, 12:26 PM

## 2020-10-07 NOTE — Progress Notes (Signed)
PROGRESS NOTE   Subjective/Complaints: Left greater than right knee pain.  Discussed once again corticosteroid injection which she declines.  Continuing with the combination of Voltaren gel and tramadol , .  ROS: Denies CP, SOB, N/V/D  Objective:   No results found. No results for input(s): WBC, HGB, HCT, PLT in the last 72 hours. No results for input(s): NA, K, CL, CO2, GLUCOSE, BUN, CREATININE, CALCIUM in the last 72 hours.  Intake/Output Summary (Last 24 hours) at 10/07/2020 0928 Last data filed at 10/07/2020 0900 Gross per 24 hour  Intake 570 ml  Output --  Net 570 ml        Physical Exam: Vital Signs Blood pressure (!) 124/54, pulse (!) 48, temperature 98.2 F (36.8 C), temperature source Oral, resp. rate 18, height '5\' 4"'$  (1.626 m), weight 103.8 kg, SpO2 98 %.  General: No acute distress Mood and affect are appropriate Heart: Regular rate and rhythm no rubs murmurs or extra sounds Lungs: Clear to auscultation, breathing unlabored, no rales or wheezes Abdomen: Positive bowel sounds, soft nontender to palpation, nondistended Extremities: No clubbing, cyanosis, or edema Skin: No evidence of breakdown, no evidence of rash  Coordination is normal Neuro: Alert and oriented, speech without dysarthria or aphasia Motor: 5/5 bilateral upper extremities 3 - bilateral hip flexor knee extensor 4 at the ankle dorsiflexors   Assessment/Plan: 1. Functional deficits which require 3+ hours per day of interdisciplinary therapy in a comprehensive inpatient rehab setting.  Physiatrist is providing close team supervision and 24 hour management of active medical problems listed below.  Physiatrist and rehab team continue to assess barriers to discharge/monitor patient progress toward functional and medical goals  Care Tool:  Bathing    Body parts bathed by patient: Chest,Abdomen,Face,Right upper leg,Left upper leg,Left  arm,Right arm,Front perineal area   Body parts bathed by helper: Right lower leg,Left lower leg,Buttocks     Bathing assist Assist Level: Moderate Assistance - Patient 50 - 74%     Upper Body Dressing/Undressing Upper body dressing   What is the patient wearing?: Pull over shirt    Upper body assist Assist Level: Minimal Assistance - Patient > 75%    Lower Body Dressing/Undressing Lower body dressing      What is the patient wearing?: Pants,Incontinence brief     Lower body assist Assist for lower body dressing: Total Assistance - Patient < 25% (bed level)     Toileting Toileting    Toileting assist Assist for toileting: Total Assistance - Patient < 25%     Transfers Chair/bed transfer  Transfers assist     Chair/bed transfer assist level: Total Assistance - Patient < 25% Chair/bed transfer assistive device: Sliding board   Locomotion Ambulation   Ambulation assist   Ambulation activity did not occur: Safety/medical concerns  Assist level: Total Assistance - Patient < 25% Assistive device: Lite Gait Max distance: 30f   Walk 10 feet activity   Assist  Walk 10 feet activity did not occur: Safety/medical concerns        Walk 50 feet activity   Assist Walk 50 feet with 2 turns activity did not occur: Safety/medical concerns  Walk 150 feet activity   Assist Walk 150 feet activity did not occur: Safety/medical concerns         Walk 10 feet on uneven surface  activity   Assist Walk 10 feet on uneven surfaces activity did not occur: Safety/medical concerns         Wheelchair     Assist Will patient use wheelchair at discharge?: Yes Type of Wheelchair: Manual (TBD)    Wheelchair assist level: Dependent - Patient 0% (in TIS wc)      Wheelchair 50 feet with 2 turns activity    Assist        Assist Level: Dependent - Patient 0%   Wheelchair 150 feet activity     Assist          Blood pressure (!)  124/54, pulse (!) 48, temperature 98.2 F (36.8 C), temperature source Oral, resp. rate 18, height '5\' 4"'$  (1.626 m), weight 103.8 kg, SpO2 98 %.    Medical Problem List and Plan: 1.  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  Antithrombotics: -DVT/anticoagulation: SCDs             -antiplatelet therapy: N/A 3. Pain Management: Tylenol as needed, Bilateral knee pain likely OA check Xrays will likely need cortisone inj, start voltaren gel and tramadol , end stage with reduced ROM would benefit from corticosteroid injection but pt would like to hold off for now  Continue Voltaren gel and tramadol, is only taking once a day 4. Mood: Provide emotional support             -antipsychotic agents: N/A 5. Neuropsych: This patient he is capable of making decisions on her own behalf. 6. Skin/Wound Care: Routine skin checks 7. Fluids/Electrolytes/Nutrition: Routine in and outs 8.  Dysphagia.  Dysphagia #2 thin liquids.  Follow-up speech therapy 9.  Hypertension:   Continue Norvasc 10 mg daily, Imdur 60 mg daily, hydralazine 100 mg every 8 hours.  Monitor with increased mobility  Clonidine started on 5/21 Vitals:   10/07/20 0040 10/07/20 0427  BP: (!) 130/50 (!) 124/54  Pulse: (!) 55 (!) 48  Resp: 16 18  Temp: 98.5 F (36.9 C) 98.2 F (36.8 C)  SpO2: 100% 98%  Controlled 5/23 10.  History of atrial fibrillation.  Chronic Eliquis reversed with Andexxa.  Continue to hold blood thinner due to [facility].  Cardiac rate controlled 11.  Diabetes mellitus with hyperglycemia.  Hemoglobin A1c 9.9.    Increase NovoLog to 5 units 3 times daily  Check blood sugars before meals and at bedtime CBG (last 3)  Recent Labs    10/06/20 1653 10/06/20 2111 10/07/20 [place] increased to 44U on  5/23   12.  Diastolic congestive heart failure.  No signs of fluid overload Filed Weights   09/30/20 1823  Weight: 103.8 kg   13.  CKD stage IIIb.    Creatinine 1.77 on 5/17, continue to monitor, avoid nonsteroidal anti-inflammatories 14.  GERD.  Continue Protonix 15.  Slow transit constipation  Bowel meds increased on 5/21  LOS: 7 days A FACE TO FACE EVALUATION WAS PERFORMED  [name] 10/07/2020, 9:28 AM

## 2020-10-07 NOTE — Progress Notes (Signed)
Speech Language Pathology Weekly Progress and Session Note  Patient Details  Name: [name] MRN: [id number] Date of Birth: 18-Jul-1934  Beginning of progress report period: Sep 30, 2020 End of progress report period: Oct 07, 2020  Today's Date: 10/07/2020 SLP Individual Time: 1116-1200 SLP Individual Time Calculation (min): 44 min  Short Term Goals: Week 1: SLP Short Term Goal 1 (Week 1): Pt will demonstrate selective attention in mildly distracting evironment for 10 minute intervals with supervision A verbal cues. SLP Short Term Goal 1 - Progress (Week 1): Met SLP Short Term Goal 2 (Week 1): Pt will demonstrated recall of novel, daily information with supervision A verbal cues. SLP Short Term Goal 2 - Progress (Week 1): Not met SLP Short Term Goal 3 (Week 1): Pt will participate in continued assessment of higher lever problem solving skills. SLP Short Term Goal 3 - Progress (Week 1): Met SLP Short Term Goal 4 (Week 1): Pt will demonstrate coordination of respiration and phonation at the phrase level with min A verbal cues to achieve 80% intelligibility. SLP Short Term Goal 4 - Progress (Week 1): Met SLP Short Term Goal 5 (Week 1): Pt will consume trials of dys 3 textures with appropriate mastication/ coordination of respiration over x3 sessions prior to upgrade. SLP Short Term Goal 5 - Progress (Week 1): Not met SLP Short Term Goal 6 (Week 1): Pt will self-monitor and self correct articulation errors with supervision A verbal cues. SLP Short Term Goal 6 - Progress (Week 1): Not met    New Short Term Goals: Week 2: SLP Short Term Goal 1 (Week 2): Pt will demonstrate selective attention in mildly distracting evironment for 15 minute intervals with supervision A verbal cues. SLP Short Term Goal 2 (Week 2): Pt will demonstrated recall of novel, daily information with supervision A verbal cues. SLP Short Term Goal 3 (Week 2): Pt will demonstrate coordination of respiration and phonation at  the phrase level with supervision A verbal cues to achieve 80% intelligibility. SLP Short Term Goal 4 (Week 2): Pt will complete basic-mildly complex problem solving skills in functional tasks with min A verbal cues. SLP Short Term Goal 5 (Week 2): Pt will consume trials of dys 3 textures with appropriate mastication/ coordination of respiration over x3 sessions prior to upgrade. SLP Short Term Goal 6 (Week 2): Pt will self-monitor and self correct articulation errors with supervision A verbal cues.  Weekly Progress Updates: Pt made moderate progress meeting 3 out 6 goals, with a focus on breathing coordinating and continued assessment of functional problem solving. Pt demonstrate carryover of diaphragmatic breathing exercises in structured tasks. Pt participated in continued assessment of problem solving skills noting novel deficits. SLP will focus on ability to upgrade solids textures to dys 3 with increase breath coordination noted, respiratory coordination at phrase/sentence level, recall of novel information and selective attention. Pt would benefit from skilled ST services in order to maximize functional independence and reduce burden of care, requiring supervision at discharge with continued skilled ST services.      Intensity: Minumum of 1-2 x/day, 30 to 90 minutes Frequency: 3 to 5 out of 7 days Duration/Length of Stay: 3.5 Treatment/Interventions: Cognitive remediation/compensation;Cueing hierarchy;Dysphagia/aspiration precaution training;Functional tasks;Medication managment;Environmental controls;Internal/external aids;Patient/family education   Daily Session Skilled Therapeutic Interventions: Skilled ST services focused on cognitive skills. SLP facilitated recall of diaphragmatic breathing and provided visual handout to carryover exercises, pt required min A fade to supervision A verbal cues in return demonstration. Pt demonstrated 90% intelligibility  and appropriate respiratory  coordination in picture description tasks at phrase level, however in conversation demonstrated 70% intelligibility and labored breathing in conversation. Pt required supervision A verbal cues for subtracting simple change and limited carryover of organizing novel medication multiple times per day in ALFA. Pt was left in room with call bell within reach and chair alarm set. SLP recommends to continue skilled services.    General    Pain Pain Assessment Pain Scale: Faces Pain Score: 0-No pain Pain Type: Chronic pain Pain Location: Knee Pain Orientation: Right;Left Pain Descriptors / Indicators: Aching;Discomfort Pain Intervention(s): Repositioned  Therapy/Group: Individual Therapy  [name]  [facility] 10/07/2020, 12:41 PM

## 2020-10-07 NOTE — Progress Notes (Signed)
Physical Therapy Session Note  Patient Details  Name: [name] MRN: [id number] Date of Birth: [birth year]  Today's Date: 10/07/2020 PT Individual Time: 1300-1415 PT Individual Time Calculation (min): 75 min   Short Term Goals: Week 1:  PT Short Term Goal 1 (Week 1): Patient will roll B with ModA and use of bed features as needed PT Short Term Goal 2 (Week 1): Patient will complete supine <> sit with no more than MaxA x1 consistently PT Short Term Goal 3 (Week 1): Patient will complete bed <> wc tranfsers with LRAD and MaxA x2 PT Short Term Goal 4 (Week 1): Patient will maintain static sitting balance EOB with no more than CGA  Skilled Therapeutic Interventions/Progress Updates:    Patient received sitting up in TIS wc, agreeable to PT. She denies pain at rest, but indicates up to 7/10 pain in B knees, L >R with any weight bearing, premedicated. PT providing rest breaks, distractions and repositioning to assist with pain management. PT discussing use/trial of powerwheelchair to allow patient to be more independent and safe with mobility. Given patients significant, end stage B knee OA and poor cardiovascular endurance, patient would benefit from use of power wheelchair. Patient agreeable to this trial. PT transporting patient in TIS wc to therapy gym for time management and energy conservation. Anterior weight shifts using B UE on arm rests to push up in preparation for sit <> stand or slideboard transfers 3x6. Patient requiring extended seated rest break between sets due to fatigue and B knee pain. TotalA x2 slideboard transfer to power wc. Patient oriented to controls of power wheelchair, but will continue to require reinforcement of safety measures and use of joystick. She was able to navigate relatively straight path and slow 90* turns in hallway with MinA and max verbal cues. Wc set to slowest speed possible to allow for patient to react as needed. Patient becoming more comfortable with controls  with more practice. Once back in the room, patient requesting to transfer to bed to use the bedpan. TotalA x2 slideboard transfer and sit > supine. Patient able to roll to her L with bed rail + MinA. TotalA placement of bedpan. RN aware of patients location. Bed alarm on, call light within reach.   Therapy Documentation Precautions:  Precautions Precautions: Fall Precaution Comments: Goal SBP < 160, right LE hip discrepency, knee pain Restrictions Weight Bearing Restrictions: No    Therapy/Group: Individual Therapy  Karoline Caldwell, PT, DPT, CBIS  10/07/2020, 7:41 AM

## 2020-10-08 LAB — GLUCOSE, CAPILLARY
Glucose-Capillary: 123 mg/dL — ABNORMAL HIGH (ref 70–99)
Glucose-Capillary: 151 mg/dL — ABNORMAL HIGH (ref 70–99)
Glucose-Capillary: 219 mg/dL — ABNORMAL HIGH (ref 70–99)
Glucose-Capillary: 237 mg/dL — ABNORMAL HIGH (ref 70–99)

## 2020-10-08 MED ORDER — DULOXETINE HCL 30 MG PO CPEP
30.0000 mg | ORAL_CAPSULE | Freq: Every day | ORAL | Status: DC
  Administered 2020-10-08 – 2020-10-10 (×3): 30 mg via ORAL
  Filled 2020-10-08 (×3): qty 1

## 2020-10-08 NOTE — Progress Notes (Signed)
Physical Therapy Weekly Progress Note  Patient Details  Name: [name] MRN: [id number] Date of Birth: 1935-01-25  Beginning of progress report period: Oct 01, 2020 End of progress report period: Oct 08, 2020  Today's Date: 10/08/2020 PT Individual Time: 0800-0857 PT Individual Time Calculation (min): 57 min   Patient has met 3 of 4 short term goals.  Patient is making steady progress toward her goals. She remains most limited by B knee pain and SOB with minimal exertion. She also is generally weak with limited motor planning and delayed initiation/response. Patient is able to tolerate ~20% bodyweight through B knees due to end stage knee OA and so is very limited in gait progressions and even anterior weight shifts onto B LE in preparation for slideboard transfers. Patient trialing power wheelchair to allow for max independent mobility. Given poor cardiovascular endurance and SOB with minimal exertion, it is likely that she would benefit from long term use of power wheelchair.   Patient continues to demonstrate the following deficits muscle weakness and muscle joint tightness, decreased cardiorespiratoy endurance, decreased initiation and delayed processing and decreased sitting balance, decreased standing balance, decreased postural control and decreased balance strategies and therefore will continue to benefit from skilled PT intervention to increase functional independence with mobility.  Patient progressing toward long term goals..  Continue plan of care.  PT Short Term Goals Week 1:  PT Short Term Goal 1 (Week 1): Patient will roll B with ModA and use of bed features as needed PT Short Term Goal 1 - Progress (Week 1): Met PT Short Term Goal 2 (Week 1): Patient will complete supine <> sit with no more than MaxA x1 consistently PT Short Term Goal 2 - Progress (Week 1): Met PT Short Term Goal 3 (Week 1): Patient will complete bed <> wc tranfsers with LRAD and MaxA x2 PT Short Term Goal 3  - Progress (Week 1): Progressing toward goal PT Short Term Goal 4 (Week 1): Patient will maintain static sitting balance EOB with no more than CGA PT Short Term Goal 4 - Progress (Week 1): Met Week 2:  PT Short Term Goal 1 (Week 2): Patient will complete bed <> wc tranfsers with LRAD and MaxA x2 PT Short Term Goal 2 (Week 2): Patient will complete bed mobility with no more than ModA PT Short Term Goal 3 (Week 2): Patient will drive PWC >323FT with no more than ModA  Skilled Therapeutic Interventions/Progress Updates:  Patient received supine in bed, agreeable to PT. She denies pain at rest. Patient requesting to use bedpan. She was able to roll with ModA and use of bed rail to allow PT to place bedpan. Patient continent of bowel, requiring TotalA perihygiene at bedlevel. MaxA to don pants at bed level. MaxA to come to sit edge of bed with HOB slightly elevated. Static sitting balance edge of mat with B UE propping. Slightly posterior bias noted, but no LOB posteriorly. Slideboard transfer to [employer]. Patient maneuvering power wc x271f with turns with up to [place] and up to Max verbal cues. Patient demonstrating improvement with maintaining straight path and making slight adjustments as needed. She will benefit from continued practice, progressing to home setting practice with tighter turns. Patient remaining up in wc, seatbelt alarm on, call light within reach.     Therapy Documentation Precautions:  Precautions Precautions: Fall Precaution Comments: Goal SBP < 160, right LE hip discrepency, knee pain Restrictions Weight Bearing Restrictions: No   Therapy/Group:  Individual Therapy  Karoline Caldwell, PT, DPT, CBIS  10/08/2020, 9:00 AM

## 2020-10-08 NOTE — Progress Notes (Signed)
Physical Therapy Session Note  Patient Details  Name: [name] MRN: [id number] Date of Birth: Jul 04, 1934  Today's Date: 10/08/2020 PT Individual Time: 1307-1350 PT Individual Time Calculation (min): 43 min   Short Term Goals: Week 2:  PT Short Term Goal 1 (Week 2): Patient will complete bed <> wc tranfsers with LRAD and MaxA x2 PT Short Term Goal 2 (Week 2): Patient will complete bed mobility with no more than ModA PT Short Term Goal 3 (Week 2): Patient will drive PWC [id number] with no more than ModA  Skilled Therapeutic Interventions/Progress Updates:  Pt received tilted back in PWC and agreeable to therapy session. Pt states she is not yet a fan of the [place] because she hasn't "ever driven anything." Therapist provided support and encouragement on continuing to practice this skill for improvement. Pt reports her LEs have been "jumping" and her pain is currently 8/10 in L>R knee - RN notified and provided Voltaren gel - of note, pt appears to have increased knee flexion posturing in the [place] (may be contributing to symptoms but also this low load, long duration stretch may help improve pt's knee flexion ROM to improve LE positioning to progress towards increased independence with transfers). PWC mobility ~129f to day room with close supervision and cuing - using on indoor setting at lowest speed for safety - navigates out of room safely with increased time. Pt requires hand-over-hand facilitation and max cuing to park w/c next to mat in preparation for transfer. R lateral scoot w/c>EOM using transfer board, total assist for board placement with pt able to weight shift to assist with placement then heavy max assist of 1 and +2 min assist to scoot hips - requires many small scoots in order to complete transfer (likely causing increased shearing forces on buttocks though pt denies pain) - cuing for anterior trunk lean/weight shift and UE positioning to improve hip clearance during transfer. L lateral scoot  transfer using transfer board back to w/c as just described - due to height of PWC pt unable to reach floor like when using TIS w/c. PWC mobility back to room with supervision and again requiring hand-over-hand assist to set-up w/c next to EOB in preparation for transfer. R lateral scoot PWC>EOB with assist as described above. Sit>supine with max assist for B LE management into bed and pt using bedrails to assist with trunk control. Pt left supine in bed with needs in reach, bed alarm on, and RN notified of pt's need for assistance with peri-care.   Therapy Documentation Precautions:  Precautions Precautions: Fall Precaution Comments: Goal SBP < 160, right LE hip discrepency, knee pain Restrictions Weight Bearing Restrictions: No  Pain:   Reports 8/10 pain in L>R knee pain - details above.    Therapy/Group: Individual Therapy  CTawana Scale, PT, DPT, CSRS  10/08/2020, 12:27 PM

## 2020-10-08 NOTE — Progress Notes (Signed)
Occupational Therapy Session Note  Patient Details  Name: [name] MRN: [id number] Date of Birth: 12-May-1935  Today's Date: 10/08/2020 OT Individual Time: 1005-1059 OT Individual Time Calculation (min): 54 min    Short Term Goals: Week 1:  OT Short Term Goal 1 (Week 1): Pt will complete LB bathing sit to stand with max assist. OT Short Term Goal 2 (Week 1): Pt will complete LB dressing to donn brief with max assist sit to stand. OT Short Term Goal 3 (Week 1): Pt will complete squat pivot or sliding board transfer to the drop arm commode with max assist. OT Short Term Goal 4 (Week 1): Pt will complete UB bathing in unsupported sitting with supervision.  Skilled Therapeutic Interventions/Progress Updates:    Pt completed sliding board transfers during session from bed to power wheelchair and to therapy mat with total +2 (pt 25%).  She needed total assist bed level for completion of cleaning up BM in her brief and donning a new one.  She was able to roll side to side with mod assist as well with use of the bed rails for support as therapist provided assist for all cleaning and clothing management.  Had her work on simple BUE strengthening EOM while using a 1 lb dowel rod to hit a therapy ball back and forth with the therapist from seated position.  She was able to complete task with 50% accuracy.  Increased difficulty still noted with all transfers transitioning her weight to her LEs to assist with scooting.  She continues to report increased pain in her knees with attempted sit to squat.  Returned to the room at the end of the session with pt left sitting up in the wheelchair with the call button and phone in reach and safety belt in place.    Therapy Documentation Precautions:  Precautions Precautions: Fall Precaution Comments: Goal SBP < 160, right LE hip discrepency, knee pain Restrictions Weight Bearing Restrictions: No  Pain: Pain Assessment Pain Scale: Faces Faces Pain Scale:  Hurts little more Pain Type: Chronic pain Pain Location: Knee Pain Orientation: Right;Left Pain Descriptors / Indicators: Discomfort Pain Onset: With Activity Pain Intervention(s): Repositioned ADL: See Care Tool Section for some details of mobility and selfcare  Therapy/Group: Individual Therapy  Deshawn Witty OTR/L 10/08/2020, 1:12 PM

## 2020-10-08 NOTE — Progress Notes (Signed)
Speech Language Pathology Daily Session Note  Patient Details  Name: [name] MRN: [id number] Date of Birth: 16-Mar-1935  Today's Date: 10/08/2020 SLP Individual Time: [id number] SLP Individual Time Calculation (min): 30 min  Short Term Goals: Week 2: SLP Short Term Goal 1 (Week 2): Pt will demonstrate selective attention in mildly distracting evironment for 15 minute intervals with supervision A verbal cues. SLP Short Term Goal 2 (Week 2): Pt will demonstrated recall of novel, daily information with supervision A verbal cues. SLP Short Term Goal 3 (Week 2): Pt will demonstrate coordination of respiration and phonation at the phrase level with supervision A verbal cues to achieve 80% intelligibility. SLP Short Term Goal 4 (Week 2): Pt will complete basic-mildly complex problem solving skills in functional tasks with min A verbal cues. SLP Short Term Goal 5 (Week 2): Pt will consume trials of dys 3 textures with appropriate mastication/ coordination of respiration over x3 sessions prior to upgrade. SLP Short Term Goal 6 (Week 2): Pt will self-monitor and self correct articulation errors with supervision A verbal cues.  Skilled Therapeutic Interventions:Skilled ST services focused on swallow and cognitive skills. SLP facilitated PO consumption trial of dys 3 textures, pt demonstrated appropriate mastication and oral clearance, however continued labored breathing. SLP recommends trial tray of dys 3 textures to assess fatigue and coordination of respiration prior to upgrade. SLP created list of current medication, pt required min A verbal cues for problem solving medication multiple times per day. Pt was left in room with call bell within reach and bed alarm set. SLP recommends to continue skilled services.     Pain Pain Assessment Pain Score: 0-No pain  Therapy/Group: Individual Therapy  [name]  [facility] 10/08/2020, 3:36 PM

## 2020-10-08 NOTE — Progress Notes (Addendum)
PROGRESS NOTE   Subjective/Complaints: Left greater than right knee pain.  Discussed once again corticosteroid injection which she declines.  Continuing with the combination of Voltaren gel and tramadol , .  Trial of motorized chair with PT  ROS: Denies CP, SOB, N/V/D  Objective:   No results found. No results for input(s): WBC, HGB, HCT, PLT in the last 72 hours. No results for input(s): NA, K, CL, CO2, GLUCOSE, BUN, CREATININE, CALCIUM in the last 72 hours.  Intake/Output Summary (Last 24 hours) at 10/08/2020 0906 Last data filed at 10/07/2020 1757 Gross per 24 hour  Intake 240 ml  Output --  Net 240 ml        Physical Exam: Vital Signs Blood pressure (!) 125/50, pulse (!) 58, temperature 98.3 F (36.8 C), temperature source Oral, resp. rate 18, height '5\' 4"'$  (1.626 m), weight 103.8 kg, SpO2 98 %.   General: No acute distress Mood and affect are appropriate Heart: Regular rate and rhythm no rubs murmurs or extra sounds Lungs: Clear to auscultation, breathing unlabored, no rales or wheezes Abdomen: Positive bowel sounds, soft nontender to palpation, nondistended Extremities: No clubbing, cyanosis, or edema Skin: No evidence of breakdown, no evidence of rash  Coordination is normal Neuro: Alert and oriented, speech without dysarthria or aphasia Motor: 5/5 bilateral upper extremities 3 - RLE, 4/5 LLE   Assessment/Plan: 1. Functional deficits which require 3+ hours per day of interdisciplinary therapy in a comprehensive inpatient rehab setting.  Physiatrist is providing close team supervision and 24 hour management of active medical problems listed below.  Physiatrist and rehab team continue to assess barriers to discharge/monitor patient progress toward functional and medical goals  Care Tool:  Bathing    Body parts bathed by patient: Chest,Abdomen,Face,Right upper leg,Left upper leg,Left arm,Right arm,Front  perineal area   Body parts bathed by helper: Right lower leg,Left lower leg,Buttocks     Bathing assist Assist Level: Moderate Assistance - Patient 50 - 74% (supine to sit)     Upper Body Dressing/Undressing Upper body dressing   What is the patient wearing?: Pull over shirt    Upper body assist Assist Level: Minimal Assistance - Patient > 75%    Lower Body Dressing/Undressing Lower body dressing      What is the patient wearing?: Pants     Lower body assist Assist for lower body dressing: Total Assistance - Patient < 25% (supine rolling side to side)     Toileting Toileting    Toileting assist Assist for toileting: Total Assistance - Patient < 25%     Transfers Chair/bed transfer  Transfers assist     Chair/bed transfer assist level: 2 Helpers Chair/bed transfer assistive device: Sliding board   Locomotion Ambulation   Ambulation assist   Ambulation activity did not occur: Safety/medical concerns  Assist level: Total Assistance - Patient < 25% Assistive device: Lite Gait Max distance: 69f   Walk 10 feet activity   Assist  Walk 10 feet activity did not occur: Safety/medical concerns        Walk 50 feet activity   Assist Walk 50 feet with 2 turns activity did not occur: Safety/medical concerns  Walk 150 feet activity   Assist Walk 150 feet activity did not occur: Safety/medical concerns         Walk 10 feet on uneven surface  activity   Assist Walk 10 feet on uneven surfaces activity did not occur: Safety/medical concerns         Wheelchair     Assist Will patient use wheelchair at discharge?: Yes Type of Wheelchair: Power    Wheelchair assist level: Minimal Assistance - Patient > 75% Max wheelchair distance: 200    Wheelchair 50 feet with 2 turns activity    Assist        Assist Level: Minimal Assistance - Patient > 75%   Wheelchair 150 feet activity     Assist      Assist Level: Minimal  Assistance - Patient > 75%   Blood pressure (!) 125/50, pulse (!) 58, temperature 98.3 F (36.8 C), temperature source Oral, resp. rate 18, height '5\' 4"'$  (1.626 m), weight 103.8 kg, SpO2 98 %.    Medical Problem List and Plan: 1.  Headache with slurred speech and decreased functional mobility secondary to intraparenchymal hemorrhage involving the right pons and cerebellar vermis, SAH as well as history of CVA with residual right-sided weakness  Continue CIR PT, OT, SLP  Team conf in  am  2.  Antithrombotics: -DVT/anticoagulation: SCDs             -antiplatelet therapy: N/A 3. Pain Management: Tylenol as needed, Bilateral knee pain likely OA check Xrays will likely need cortisone inj, start voltaren gel and tramadol , end stage with reduced ROM would benefit from corticosteroid injection but pt would like to hold off for now  Continue Voltaren gel and tramadol, is only taking once a day, add duloxetine for chronic knee pain 4. Mood: Provide emotional support             -antipsychotic agents: N/A 5. Neuropsych: This patient he is capable of making decisions on her own behalf. 6. Skin/Wound Care: Routine skin checks 7. Fluids/Electrolytes/Nutrition: Routine in and outs 8.  Dysphagia.  Dysphagia #2 thin liquids.  Follow-up speech therapy 9.  Hypertension:   Continue Norvasc 10 mg daily, Imdur 60 mg daily, hydralazine 100 mg every 8 hours.  Monitor with increased mobility  Clonidine started on 5/21 Vitals:   10/07/20 1920 10/08/20 0500  BP:  (!) 125/50  Pulse: (!) 59 (!) 58  Resp:  18  Temp:  98.3 F (36.8 C)  SpO2: 100% 98%  Controlled 5/24 10.  History of atrial fibrillation.  Chronic Eliquis reversed with Andexxa.  Continue to hold blood thinner due to [facility].  Cardiac rate controlled 11.  Diabetes mellitus with hyperglycemia.  Hemoglobin A1c 9.9.    Increase NovoLog to 5 units 3 times daily  Check blood sugars before meals and at bedtime CBG (last 3)  Recent Labs    10/07/20 1612  10/07/20 2149 10/08/20 0609  GLUCAP 263* 185* 123*   Levimir increased to 44U on 5/23, good range this am , monitor on current dose    12.  Diastolic congestive heart failure.  No signs of fluid overload Filed Weights   09/30/20 1823  Weight: 103.8 kg   13.  CKD stage IIIb.    Creatinine 1.77 on 5/17, continue to monitor, avoid nonsteroidal anti-inflammatories 14.  GERD.  Continue Protonix 15.  Slow transit constipation  Bowel meds increased on 5/21  LOS: 8 days A FACE TO [place] E  Janeah Kovacich 10/08/2020, 9:06 AM

## 2020-10-09 ENCOUNTER — Inpatient Hospital Stay (HOSPITAL_COMMUNITY): Payer: Medicare HMO

## 2020-10-09 LAB — COMPREHENSIVE METABOLIC PANEL
ALT: 21 U/L (ref 0–44)
AST: 17 U/L (ref 15–41)
Albumin: 3 g/dL — ABNORMAL LOW (ref 3.5–5.0)
Alkaline Phosphatase: 47 U/L (ref 38–126)
Anion gap: 7 (ref 5–15)
BUN: 36 mg/dL — ABNORMAL HIGH (ref 8–23)
CO2: 20 mmol/L — ABNORMAL LOW (ref 22–32)
Calcium: 8.6 mg/dL — ABNORMAL LOW (ref 8.9–10.3)
Chloride: 106 mmol/L (ref 98–111)
Creatinine, Ser: 1.6 mg/dL — ABNORMAL HIGH (ref 0.44–1.00)
GFR, Estimated: 32 mL/min — ABNORMAL LOW (ref >60)
Glucose, Bld: 198 mg/dL — ABNORMAL HIGH (ref 70–99)
Potassium: 5 mmol/L (ref 3.5–5.1)
Sodium: 133 mmol/L — ABNORMAL LOW (ref 135–145)
Total Bilirubin: 0.6 mg/dL (ref 0.3–1.2)
Total Protein: 6.8 g/dL (ref 6.5–8.1)

## 2020-10-09 LAB — CBC WITH DIFFERENTIAL/PLATELET
Abs Immature Granulocytes: 0.06 10*3/uL (ref 0.00–0.07)
Basophils Absolute: 0 10*3/uL (ref 0.0–0.1)
Basophils Relative: 0 %
Eosinophils Absolute: 0 10*3/uL (ref 0.0–0.5)
Eosinophils Relative: 0 %
HCT: 31.6 % — ABNORMAL LOW (ref 36.0–46.0)
Hemoglobin: 10.5 g/dL — ABNORMAL LOW (ref 12.0–15.0)
Immature Granulocytes: 1 %
Lymphocytes Relative: 12 %
Lymphs Abs: 1.3 10*3/uL (ref 0.7–4.0)
MCH: 29.1 pg (ref 26.0–34.0)
MCHC: 33.2 g/dL (ref 30.0–36.0)
MCV: 87.5 fL (ref 80.0–100.0)
Monocytes Absolute: 0.5 10*3/uL (ref 0.1–1.0)
Monocytes Relative: 4 %
Neutro Abs: 8.7 10*3/uL — ABNORMAL HIGH (ref 1.7–7.7)
Neutrophils Relative %: 83 %
Platelets: 260 10*3/uL (ref 150–400)
RBC: 3.61 MIL/uL — ABNORMAL LOW (ref 3.87–5.11)
RDW: 15.1 % (ref 11.5–15.5)
WBC: 10.6 10*3/uL — ABNORMAL HIGH (ref 4.0–10.5)
nRBC: 0 % (ref 0.0–0.2)

## 2020-10-09 LAB — GLUCOSE, CAPILLARY
Glucose-Capillary: 156 mg/dL — ABNORMAL HIGH (ref 70–99)
Glucose-Capillary: 154 mg/dL — ABNORMAL HIGH (ref 70–99)
Glucose-Capillary: 187 mg/dL — ABNORMAL HIGH (ref 70–99)
Glucose-Capillary: 150 mg/dL — ABNORMAL HIGH (ref 70–99)
Glucose-Capillary: 145 mg/dL — ABNORMAL HIGH (ref 70–99)

## 2020-10-09 MED ORDER — SENNOSIDES-DOCUSATE SODIUM 8.6-50 MG PO TABS: 2.0000 | ORAL_TABLET | Freq: Two times a day (BID) | ORAL | Status: DC

## 2020-10-09 MED ORDER — SODIUM CHLORIDE 0.45 % IV SOLN
INTRAVENOUS | Status: DC
  Administered 2020-10-10: 1000 mL via INTRAVENOUS

## 2020-10-09 MED ORDER — SORBITOL 70 % SOLN
300.0000 mL | TOPICAL_OIL | Freq: Once | ORAL | Status: AC
  Administered 2020-10-09: 300 mL via RECTAL
  Filled 2020-10-09: qty 90

## 2020-10-09 MED ORDER — BISACODYL 10 MG RE SUPP
10.0000 mg | Freq: Every day | RECTAL | Status: DC | PRN
  Administered 2020-10-11: 10 mg via RECTAL
  Filled 2020-10-09: qty 1

## 2020-10-09 MED ORDER — BISACODYL 10 MG RE SUPP
10.0000 mg | Freq: Once | RECTAL | Status: AC
  Administered 2020-10-09: 10 mg via RECTAL
  Filled 2020-10-09: qty 1

## 2020-10-09 MED ORDER — ONDANSETRON HCL 4 MG PO TABS
4.0000 mg | ORAL_TABLET | Freq: Three times a day (TID) | ORAL | Status: DC | PRN
  Administered 2020-10-09: 4 mg via ORAL
  Filled 2020-10-09 (×2): qty 1

## 2020-10-09 MED ORDER — CLONIDINE HCL 0.1 MG PO TABS
0.1000 mg | ORAL_TABLET | Freq: Three times a day (TID) | ORAL | Status: DC
  Administered 2020-10-09 – 2020-10-10 (×5): 0.1 mg via ORAL
  Filled 2020-10-09 (×5): qty 1

## 2020-10-09 NOTE — Progress Notes (Signed)
Family has left and pt agreed to work with therapy. Did report that having nausea. Therapy will let me know when done.

## 2020-10-09 NOTE — Progress Notes (Signed)
Patient ID: [name], female   DOB: Oct 14, 1934, 86 y.o.   MRN: [id number]   [facility] lift ordered through [place].    [place], [place]

## 2020-10-09 NOTE — Patient Care Conference (Signed)
Inpatient RehabilitationTeam Conference and Plan of Care Update Date: 10/09/2020   Time: 10:37 AM    Patient Name: [name]      Medical Record Number: [id number]  Date of Birth: 06-Aug-1934 Sex: Female         Room/Bed: 4W06C/4W06C-01 Payor Info: Payor: [employer] MEDICARE / Plan: [place] HMO / Product Type: *No Product type* /    Admit Date/Time:  09/30/2020  6:20 PM  Primary Diagnosis:  Intraparenchymal hemorrhage of brain [facility])  Hospital Problems: Principal Problem:   Intraparenchymal hemorrhage of brain [facility]) Active Problems:   Controlled type 2 diabetes mellitus with hyperglycemia, with long-term current use of insulin [facility])    Expected Discharge Date: Expected Discharge Date: 10/17/20  Team Members Present: Physician leading conference: Dr. Alysia Penna Care Coodinator Present: Dorien Chihuahua, RN, BSN, CRRN;Christina Rusk, BSW Nurse Present: Dorien Chihuahua, RN PT Present: Estevan Ryder, PT OT Present: Clyda Greener, OT SLP Present: Nadara Mode, SLP PPS Coordinator present : Gunnar Fusi, SLP     Current Status/Progress Goal Weekly Team Focus  Bowel/Bladder   Patient complained of sudden  strong pressure on her rectal area and feeling sick to her stomach. rectal exam felt like a hard stool she can't push out. suppository and miralax given with minimal effect. Nausea relieved atleast. Repositioned several times in bed.  Patient will be able to completely empty her bowels and relieve pressure  Patient will achieve regular bowel movement and remain continent   Swallow/Nutrition/ Hydration   dys 2 textures and thin liquids  Mod I  trial try of dys3 to assess fatigue and repitatory coordination   ADL's   Min assist for UB selfcare with total assist for LB selfcare, total +2 for sliding board transfers  min to mod assist  selfcare retraining, transfer training, neuromuscular re-education, balance retraining, DME/Ae education, therapeutic activites, pt/family  education   Mobility   ModA rolling using bed rails, MaxA sit <> supine, +2 TotalA STS or Sara lift with very poor tolerance d/t knee pain, up to [place] negotiation straight/turns, +2 MaxA SBT  MinA/ModA, likely will d/c gait goals because knees will severely impact her ability to ambulate  wc mobility, transfers, bed mobility, functional strength, sitting balance   Communication   Min-supervision A  Mod I  breathing and speech strategies   Safety/Cognition/ Behavioral Observations  Min A  Mod I  higher level problem solving and recall   Pain   Patient has no pain except for the sudden strong pressure coming from her sacral area which she does not consider pain  Patient will remain pain free  Assess for pain q shift and prn, medicate for relief   Skin   Patient has no new skin issues aside from her bilateral leg dryness and discoloration from previous cellulitis. Hemorrhoids evident as well.  Patient will not have new skin breakdown  reposition pt regularly, keep skin clean and dry     Discharge Planning:  pt able to d/c home or to d/c home with children (has 8 children), 24/7   Team Discussion: Physical issues greater than stroke issues. Abd. Pain; KUB showed stool in right colon = SMOG enema ordered per MD. Knee pain persists and patient is unable to stand; staff recommend hoyer lift for discharge.  Patient on target to meet rehab goals: Currently supervision - min assit for upper body bathing and min assist for dressing. Total assist for hygiene and lower body care, mod assist for rolling. Total assist +2  for slide board transfers. Total assist for sit - anterior stand/weight shifting.   *See Care Plan and progress notes for long and short-term goals.   Revisions to Treatment Plan:  Downgraded goals for OT and PT Working on breathing exercises D3 trials  Teaching Needs: Transfers/toileting with a hoyer lift, medications, safety, secondary stroke risk management, etc.  Current  Barriers to Discharge: Decreased caregiver support, Home enviroment access/layout and physical issues/inability to stand  Possible Resolutions to Barriers: Family education DME/Hoyer lift for discharge     Medical Summary Current Status: severe OA both knees, nauseated today , constipation  Barriers to Discharge: Medical stability   Possible Resolutions to [employer] Focus: eval GI issues with KUB, additional w/u may be needed   Continued Need for Acute Rehabilitation Level of Care: The patient requires daily medical management by a physician with specialized training in physical medicine and rehabilitation for the following reasons: Direction of a multidisciplinary physical rehabilitation program to maximize functional independence : Yes Medical management of patient stability for increased activity during participation in an intensive rehabilitation regime.: Yes Analysis of laboratory values and/or radiology reports with any subsequent need for medication adjustment and/or medical intervention. : Yes   I attest that I was present, lead the team conference, and concur with the assessment and plan of the team.   Dorien Chihuahua B 10/09/2020, 1:51 PM

## 2020-10-09 NOTE — Progress Notes (Signed)
Occupational Therapy Note  Patient Details  Name: [name] MRN: [id number] Date of Birth: [birth year]  Pt in bed getting IV placed.  She politely declined participation in EOB or activity or working on selfcare tasks in bed, stating she did not feel well.  Attempted to persuade pt to participate, but she continued to decline.  Call button and phone in reach with bed alarm in place.  She missed 60 mins of OT therapy.     Alfio Loescher OTR/L 10/09/2020, 11:23 AM

## 2020-10-09 NOTE — Progress Notes (Signed)
Occupational Therapy Weekly Progress Note  Patient Details  Name: [name] MRN: [id number] Date of Birth: June 27, 1934  Beginning of progress report period: Oct 01, 2020 End of progress report period: Oct 09, 2020  Today's Date: 10/09/2020 OT Individual Time:  -       Patient has met 0 of 4 short term goals.  [name] is making slower progress than originally expected secondary to weakness and increased pain in her knees when attempting transfers or sit to stand with LB selfcare.  Currently she is able to complete UB selfcare sitting unsupported EOB with min assist overall.  Sitting balance is supervision as well.  She currently cannot stand functionally with total +2 assist so she has had to transition to supine rolling for LB selfcare to wash her buttocks and donn LB clothing.  She is able to roll side to side with mod assist to help with washing of buttocks and pulling items over hips, however she needs total assist to complete these tasks.  She has been incontinent of bladder and bowel and wears a brief, so toilet transfers have not been completed.  Currently she completes total +2 (pt 20%) for sliding board transfers to the wheelchair or power wheelchair. RUE functional movement continues to progress and she functionally uses it at a non-dominant level with selfcare tasks at supervision.  Slight ataxia is noted but this is also present in the RUE from previous neurological injury.  Overall, she demonstrates limited progress at this time secondary to deconditioning and pain.  Feel she will likely not meet established LTGs set a min to mod assist sit to stand and will need for sit to supine for selfcare and toileting tasks to begin with at home.  Recommend continued CIR level therapy to continue to work on strengthening and for greater independence in ADL tasks.  Planned discharge is currently 6/2 with the need for family education to be provided before discharge secondary to significant decline in  independence level with this admission.    Patient continues to demonstrate the following deficits: muscle weakness, muscle joint tightness and muscle paralysis, impaired timing and sequencing, unbalanced muscle activation and decreased coordination and decreased standing balance, decreased postural control and decreased balance strategies and therefore will continue to benefit from skilled OT intervention to enhance overall performance with BADL and Reduce care partner burden.  Patient not progressing toward long term goals.  See goal revision..  Continue plan of care.  OT Short Term Goals Week 2:  OT Short Term Goal 1 (Week 2): Continue working on downgraded LTGs set at mod to max assist overall.    Therapy Documentation Precautions:  Precautions Precautions: Fall Precaution Comments: Goal SBP < 160, right LE hip discrepency, knee pain Restrictions Weight Bearing Restrictions: No   Therapy/Group: Individual Therapy  Koya Hunger OTR/L 10/09/2020, 11:26 AM

## 2020-10-09 NOTE — Plan of Care (Signed)
  Problem: RH Balance Goal: LTG Patient will maintain dynamic standing with ADLs (OT) Description: LTG:  Patient will maintain dynamic standing balance with assist during activities of daily living (OT)  Outcome: Not Applicable Flowsheets (Taken 10/09/2020 1138) LTG: Pt will maintain dynamic standing balance during ADLs with: (goal discharged based on progress) -- Note: Goal discharged based on progress.   Problem: RH Toilet Transfers Goal: LTG Patient will perform toilet transfers w/assist (OT) Description: LTG: Patient will perform toilet transfers with assist, with/without cues using equipment (OT) Outcome: Not Applicable Flowsheets (Taken 10/09/2020 1138) LTG: Pt will perform toilet transfers with assistance level of: (Goal discharged based on progress.) -- Note: Goal discharged based on progress.   Problem: RH Tub/Shower Transfers Goal: LTG Patient will perform tub/shower transfers w/assist (OT) Description: LTG: Patient will perform tub/shower transfers with assist, with/without cues using equipment (OT) Outcome: Not Applicable Flowsheets (Taken 10/09/2020 1138) LTG: Pt will perform tub/shower stall transfers with assistance level of: (Goal discharged based on progress.) -- Note: Goal discharged based on progress.

## 2020-10-09 NOTE — Progress Notes (Addendum)
SMOG administered no results at this time.  Pt has refused all meals but reports "feeling a little better". Pt has been resting all day.

## 2020-10-09 NOTE — Progress Notes (Signed)
Physical Therapy Session Note  Patient Details  Name: [name] MRN: [id number] Date of Birth: 1934/05/31  Today's Date: 10/09/2020 PT Individual Time: [id number] PT Individual Time Calculation (min): 15 min  and Today's Date: 10/09/2020 PT Missed Time: 30 Minutes Missed Time Reason: Patient ill (Comment)  Short Term Goals: Week 2:  PT Short Term Goal 1 (Week 2): Patient will complete bed <> wc tranfsers with LRAD and MaxA x2 PT Short Term Goal 2 (Week 2): Patient will complete bed mobility with no more than ModA PT Short Term Goal 3 (Week 2): Patient will drive PWC [id number] with no more than ModA  Skilled Therapeutic Interventions/Progress Updates:     Patient received supine in bed, emesis bag on chest, washcloth over forehead, hooked up to vitals machine. Patient reports not feeling well, N/V with evidence of recent emesis. O2 97%, HR 77-80BPM. Patient initially not verbally responding to PTs questions, but eventually was able to speak in soft, low tone. Patient stating that she felt as though she had to have a bowel movement and/or urinate. PT assisting patient with rolling TotalA, while rehab tech placed bedpan. Patient remaining in bed on bedpan, call light within reach, bed alarm on, NT and RN aware of patients disposition.   Therapy Documentation Precautions:  Precautions Precautions: Fall Precaution Comments: Goal SBP < 160, right LE hip discrepency, knee pain Restrictions Weight Bearing Restrictions: No    Therapy/Group: Individual Therapy  Karoline Caldwell, PT, DPT, CBIS  10/09/2020, 7:44 AM

## 2020-10-09 NOTE — Progress Notes (Signed)
Patient ID: [name], female   DOB: 18-Oct-1934, 86 y.o.   MRN: [id number]   SW made attempt to call pt daughter to provide conference updates. No answer- left VM., sw will follow up  Erlene Quan, [place]

## 2020-10-09 NOTE — Progress Notes (Signed)
Patient ID: [name], female   DOB: 04-Apr-1935, 86 y.o.   MRN: [id number] Team Conference Report to Patient/Family  Team Conference discussion was reviewed with the patient and caregiver, including goals, any changes in plan of care and target discharge date.  Patient and caregiver express understanding and are in agreement.  The patient has a target discharge date of 10/17/20.  SW spoke to pt daughter Aniceto Boss). Provided updated. Dtr mentions pt did have severe knee pain previously, but was able to maintain with rolling walker. Daughter agreed to come in 2 days for family education will call sw back with 2 days that work for her and her sister. Pt reports nursing staff are using pt personal briefs, sw will go in pt room and label briefs.  Dyanne Iha 10/09/2020, 12:05 PM

## 2020-10-09 NOTE — Progress Notes (Signed)
Patient complains of sudden strong pressure on her sacral area/back of her stomach with a feeling of "sick to her stomach" digital rectal exam felt like a hard stool she is not able to push out. V/S within normal limits. She denies pain anywhere, Notified on-call NP and ordered dulcolax suppository, order carried out.   Patient had a large BM afterwards, hemorrhoids with minimal bleeding evident and patient complains of pain from it.

## 2020-10-09 NOTE — Progress Notes (Signed)
PROGRESS NOTE   Subjective/Complaints: Required manual disimpaction last noc, nauseated today , no abd pain, some dizziness  ROS: Denies CP, SOB, no voiting  Objective:   No results found. No results for input(s): WBC, HGB, HCT, PLT in the last 72 hours. No results for input(s): NA, K, CL, CO2, GLUCOSE, BUN, CREATININE, CALCIUM in the last 72 hours.  Intake/Output Summary (Last 24 hours) at 10/09/2020 0918 Last data filed at 10/09/2020 0500 Gross per 24 hour  Intake 240 ml  Output 450 ml  Net -210 ml        Physical Exam: Vital Signs Blood pressure (!) 183/68, pulse 75, temperature 98.2 F (36.8 C), resp. rate 18, height 5' 4"  (1.626 m), weight 103.8 kg, SpO2 100 %.   General: No acute distress Mood and affect are appropriate Heart: Regular rate and rhythm no rubs murmurs or extra sounds Lungs: Clear to auscultation, breathing unlabored, no rales or wheezes Abdomen: Positive bowel sounds, soft nontender to palpation, nondistended Extremities: No clubbing, cyanosis, or edema Skin: No evidence of breakdown, no evidence of rash  Cerebellar minimal dysmetria Right FNF, normal on left -unchanged  Neuro: Alert and oriented, speech without dysarthria or aphasia Motor: 5/5 bilateral upper extremities 3 - RLE, 4/5 LLE   Assessment/Plan: 1. Functional deficits which require 3+ hours per day of interdisciplinary therapy in a comprehensive inpatient rehab setting.  Physiatrist is providing close team supervision and 24 hour management of active medical problems listed below.  Physiatrist and rehab team continue to assess barriers to discharge/monitor patient progress toward functional and medical goals  Care Tool:  Bathing    Body parts bathed by patient: Chest,Abdomen,Face,Right upper leg,Left upper leg,Left arm,Right arm,Front perineal area   Body parts bathed by helper: Right lower leg,Left lower leg,Buttocks      Bathing assist Assist Level: Moderate Assistance - Patient 50 - 74% (supine to sit)     Upper Body Dressing/Undressing Upper body dressing   What is the patient wearing?: Pull over shirt    Upper body assist Assist Level: Minimal Assistance - Patient > 75%    Lower Body Dressing/Undressing Lower body dressing      What is the patient wearing?: Pants     Lower body assist Assist for lower body dressing: Total Assistance - Patient < 25% (supine rolling side to side)     Toileting Toileting    Toileting assist Assist for toileting: Total Assistance - Patient < 25% (supine rolling)     Transfers Chair/bed transfer  Transfers assist     Chair/bed transfer assist level: 2 Helpers (max A of 1 and +2 min A) Chair/bed transfer assistive device: Sliding board   Locomotion Ambulation   Ambulation assist   Ambulation activity did not occur: Safety/medical concerns  Assist level: Total Assistance - Patient < 25% Assistive device: Lite Gait Max distance: 35f   Walk 10 feet activity   Assist  Walk 10 feet activity did not occur: Safety/medical concerns        Walk 50 feet activity   Assist Walk 50 feet with 2 turns activity did not occur: Safety/medical concerns         Walk 150  feet activity   Assist Walk 150 feet activity did not occur: Safety/medical concerns         Walk 10 feet on uneven surface  activity   Assist Walk 10 feet on uneven surfaces activity did not occur: Safety/medical concerns         Wheelchair     Assist Will patient use wheelchair at discharge?: Yes Type of Wheelchair: Power    Wheelchair assist level: Minimal Assistance - Patient > 75% Max wheelchair distance: 118f    Wheelchair 50 feet with 2 turns activity    Assist        Assist Level: Minimal Assistance - Patient > 75%   Wheelchair 150 feet activity     Assist      Assist Level: Minimal Assistance - Patient > 75%   Blood pressure  (!) 183/68, pulse 75, temperature 98.2 F (36.8 C), resp. rate 18, height 5' 4"  (1.626 m), weight 103.8 kg, SpO2 100 %.    Medical Problem List and Plan: 1.  Headache with slurred speech and decreased functional mobility secondary to intraparenchymal hemorrhage involving the right pons and cerebellar vermis, SAH as well as history of CVA with residual right-sided weakness  Continue CIR PT, OT, SLP  Team conference today please see physician documentation under team conference tab, met with team  to discuss problems,progress, and goals. Formulized individual treatment plan based on medical history, underlying problem and comorbidities.  Team conf in  am  2.  Antithrombotics: -DVT/anticoagulation: SCDs             -antiplatelet therapy: N/A 3. Pain Management: Tylenol as needed, Bilateral knee pain likely OA check Xrays will likely need cortisone inj, start voltaren gel and tramadol , end stage with reduced ROM would benefit from corticosteroid injection but pt would like to hold off for now  Continue Voltaren gel and tramadol, is only taking once a day, add duloxetine for chronic knee pain 4. Mood: Provide emotional support             -antipsychotic agents: N/A 5. Neuropsych: This patient he is capable of making decisions on her own behalf. 6. Skin/Wound Care: Routine skin checks 7. Fluids/Electrolytes/Nutrition: Routine in and outs 8.  Dysphagia.  Dysphagia #2 thin liquids.  Follow-up speech therapy 9.  Hypertension:   Continue Norvasc 10 mg daily, Imdur 60 mg daily, hydralazine 100 mg every 8 hours.  Monitor with increased mobility  Clonidine started on 5/21 Vitals:   10/09/20 0332 10/09/20 0749  BP: (!) 155/61 (!) 183/68  Pulse: 64 75  Resp: 18   Temp: 98.2 F (36.8 C)   SpO2: 99% 100%  elevated 5/25 10.  History of atrial fibrillation.  Chronic Eliquis reversed with Andexxa.  [facility]  Cardiac rate controlled 11.  Diabetes mellitus with  hyperglycemia.  Hemoglobin A1c 9.9.    Increase NovoLog to 5 units 3 times daily  Check blood sugars before meals and at bedtime CBG (last 3)  Recent Labs    10/08/20 2052 10/09/20 0623 10/09/20 [place] 44U on 5/23, good range this am , monitor on current dose    12.  Diastolic congestive heart failure.  No signs of fluid overload Filed Weights   09/30/20 1823  Weight: 103.8 kg   13.  CKD stage IIIb.    Creatinine 1.77 on 5/17, continue to monitor, avoid nonsteroidal anti-inflammatories 14.  GERD.  Continue  Protonix 15.  Slow transit constipation  Bowel meds increased on 5/21, manual disimpaction 5/24, will check KUB, may need additional laxatives LOS: 9 days A FACE TO [place] E Raeleigh Guinn 10/09/2020, 9:18 AM

## 2020-10-09 NOTE — Progress Notes (Signed)
Family at bedside, pt says feeling a little better. Request I wait till family leaves before enema administered.

## 2020-10-09 NOTE — Progress Notes (Signed)
Physical Therapy Session Note  Patient Details  Name: [name] MRN: [id number] Date of Birth: [birth year]  Today's Date: 10/09/2020 PT Individual Time: 1415-1500 PT Individual Time Calculation (min): 45 min   Short Term Goals: Week 1:  PT Short Term Goal 1 (Week 1): Patient will roll B with ModA and use of bed features as needed PT Short Term Goal 1 - Progress (Week 1): Met PT Short Term Goal 2 (Week 1): Patient will complete supine <> sit with no more than MaxA x1 consistently PT Short Term Goal 2 - Progress (Week 1): Met PT Short Term Goal 3 (Week 1): Patient will complete bed <> wc tranfsers with LRAD and MaxA x2 PT Short Term Goal 3 - Progress (Week 1): Progressing toward goal PT Short Term Goal 4 (Week 1): Patient will maintain static sitting balance EOB with no more than CGA PT Short Term Goal 4 - Progress (Week 1): Met Week 2:  PT Short Term Goal 1 (Week 2): Patient will complete bed <> wc tranfsers with LRAD and MaxA x2 PT Short Term Goal 2 (Week 2): Patient will complete bed mobility with no more than ModA PT Short Term Goal 3 (Week 2): Patient will drive PWC >021JD with no more than ModA Week 3:     Skilled Therapeutic Interventions/Progress Updates:    Pain:  Pt reports stomach pain/nausea, no number given.  Treatment to tolerance.  Rest breaks and repositioning as needed.   Pt initially supine and declining therapy.  Family in room and leaving for day.  Able to discuss benefits of activity, pt then agreeable to bedside session. Pt rolls L/R w/max assist, pt w/heavily soilded bed, clothing, brief.  Rolls mult times for brief change, perineal care, and removal of bedding/cleaning of bed.  Pt total assist for cleaning post perineum, able to clean anterior/groin w/mod assist and set up. Supine to side to sit w/max assist.  Pt able to sit at edge of bed x 30 min for the following: Removes soiled shirt w/mod assist. Total assist for washing/drying back Set up assist for  washing face Min assist to don clean shirt Combs front of hair w/set up, back of head total assist.  Seated therex: Cervical rotation w/emphasis on visual scanning and roatation to r. Single arm raises 2x10 w/cga for balance Dual arm raises 2x10 w/min assist for balance Reaching to R to promote wt shift to r, visual scanning and attention to r. LAQs 2 x 15, partial rom R Tapping foot to music on LLE only.  Sit to supine w/max assist of 2. Pt rolls L/R w max assist for repositioning/straightening linens/clothing. Scoots supine dependent of 2. Pt left supine w/rails up x 4, alarm set, bed in lowest position, and needs in reach. Nursing notified PT session complete as requested for planned enema.    Therapy Documentation Precautions:  Precautions Precautions: Fall Precaution Comments: Goal SBP < 160, right LE hip discrepency, knee pain Restrictions Weight Bearing Restrictions: No   Therapy/Group: Individual Therapy  Callie Fielding, [place] 10/09/2020, 3:54 PM

## 2020-10-10 LAB — GLUCOSE, CAPILLARY
Glucose-Capillary: 163 mg/dL — ABNORMAL HIGH (ref 70–99)
Glucose-Capillary: 234 mg/dL — ABNORMAL HIGH (ref 70–99)
Glucose-Capillary: 200 mg/dL — ABNORMAL HIGH (ref 70–99)
Glucose-Capillary: 212 mg/dL — ABNORMAL HIGH (ref 70–99)

## 2020-10-10 NOTE — Progress Notes (Signed)
Occupational Therapy Session Note  Patient Details  Name: [name] MRN: [id number] Date of Birth: 01/14/1935  Today's Date: 10/10/2020 OT Individual Time: 1000-1038 OT Individual Time Calculation (min): 38 min  and Today's Date: 10/10/2020 OT Missed Time: 20 Minutes Missed Time Reason: Patient ill (comment);Other (comment) (nausea / dizziness)   Short Term Goals: Week 2:  OT Short Term Goal 1 (Week 2): Continue working on downgraded LTGs set at mod to max assist overall.  Skilled Therapeutic Interventions/Progress Updates:    Pt received semi-reclined in bed, denies pain but c/o nausea, agreeable to therapy. BP semi-reclined read at 147/66 (88). Session focus on activity tolerance, bed mobility, static sitting balance, and BUE/BLE strengthening in prep for improved ADL performance. Came to sitting EOB with max A to lift trunk and to progress BLE off bed - pt is able to walk her BLE towards the EOB + hold onto bed rail to push up. Maintains static sitting balance with mod A to prevent posterior LOB, relies heavily on RUE support on bed rail. Coached through PLB as pt exhibited DOE sititng EOB. Able to remain seated for ~5 min + able to wash her face before req to lay down.  max A to return to supine, total A +2 to boost up in bed. Pt completed 3x5 of the following exercises at bed level : SQA, chest press, shoulder flexion holds 30 sec each. Req therapeutic rest break in between sets and coached through PLB throughout to decrease SOB. Pt cont to c/o significant dizziness that did not improve with laying down. BP read at  148/52 (79). Session ceased 2/2 pt nausea.   Pt left semi-reclined in bed, call alarm engaged, call bell in reach, and all immediate needs met.    Therapy Documentation Precautions:  Precautions Precautions: Fall Precaution Comments: Goal SBP < 160, right LE hip discrepency, knee pain Restrictions Weight Bearing Restrictions: No Pain:   denies, c/o  nausea/dizziness ADL: See Care Tool for more details.   Therapy/Group: Individual Therapy  Volanda Napoleon MS, OTR/L  10/10/2020, 6:54 AM

## 2020-10-10 NOTE — Progress Notes (Signed)
Orthopedic Tech Progress Note Patient Details:  [name] December 06, 1934 [id number] Called order into [facility] Patient ID: [name], female   DOB: Nov 14, 1934, 86 y.o.   MRN: [id number]   Chip Boer 10/10/2020, 6:46 PM

## 2020-10-10 NOTE — Progress Notes (Signed)
Physical Therapy Session Note  Patient Details  Name: LEXINGTON DEVINE MRN: [id number] Date of Birth: 1934-07-05  Today's Date: 10/10/2020 PT Individual Time: 1100-1139 PT Individual Time Calculation (min): 39 min   Short Term Goals: Week 2:  PT Short Term Goal 1 (Week 2): Patient will complete bed <> wc tranfsers with LRAD and MaxA x2 PT Short Term Goal 2 (Week 2): Patient will complete bed mobility with no more than ModA PT Short Term Goal 3 (Week 2): Patient will drive PWC >706CB with no more than ModA   Skilled Therapeutic Interventions/Progress Updates:   Pt received supine in bed and agreeable to PT at bed level. Pt reports continued dizziness and mild nausea.   Supine therex. SLR, hip abduction, heel slide, SAQ, ankle DF each completed x 10 BLE with cues for full ROM and attention to task. Prolonged rest rest break between bouts as needed. Pt noted to have severely reduce ROM in Bil knees and R knee.   Manual therapy to improve BLE knee and ankle ROM. With grade 2-3 mobilizations 3 x 30 sec each and PROM to end range 2 x 1 min hold for each joint. Pt reporting increasing nausea, and left supine in bed with all needs met.   Pt left supine in bed with call bell in reach and all needs met.        Therapy Documentation Precautions:  Precautions Precautions: Fall Precaution Comments: Goal SBP < 160, right LE hip discrepency, knee pain Restrictions Weight Bearing Restrictions: No  Pain:   denies Therapy/Group: Individual Therapy  Lorie Phenix 10/10/2020, 1:06 PM

## 2020-10-10 NOTE — Progress Notes (Signed)
Speech Language Pathology Daily Session Note  Patient Details  Name: [name] MRN: [id number] Date of Birth: 01/05/1935  Today's Date: 10/10/2020 SLP Individual Time: [id number] SLP Individual Time Calculation (min): 45 min  Short Term Goals: Week 2: SLP Short Term Goal 1 (Week 2): Pt will demonstrate selective attention in mildly distracting evironment for 15 minute intervals with supervision A verbal cues. SLP Short Term Goal 2 (Week 2): Pt will demonstrated recall of novel, daily information with supervision A verbal cues. SLP Short Term Goal 3 (Week 2): Pt will demonstrate coordination of respiration and phonation at the phrase level with supervision A verbal cues to achieve 80% intelligibility. SLP Short Term Goal 4 (Week 2): Pt will complete basic-mildly complex problem solving skills in functional tasks with min A verbal cues. SLP Short Term Goal 5 (Week 2): Pt will consume trials of dys 3 textures with appropriate mastication/ coordination of respiration over x3 sessions prior to upgrade. SLP Short Term Goal 6 (Week 2): Pt will self-monitor and self correct articulation errors with supervision A verbal cues.  Skilled Therapeutic Interventions: Skilled SLP intervention focused on dysphagia and cognition. Pt completed trials with dys 3 snack. She demonstrated slow but adequate mastication. She has sparse lower dentition but  reported she has lower dentures at home. She tolerated dys 3 trials with no overt s/sx of aspiration or penetration. She reported she managed medications on  her own prior to hospitalization and used a BID pill organizer. She answered problem solving questions regarding TID pill organizer with mod A visual and verbal cues. Cont instruction and practice on pill organizer next session. Pt left seated upright in bed with bed alarm set and call button within reach.   Pain Pain Assessment Pain Scale: Faces Faces Pain Scale: No hurt  Therapy/Group: Individual  Therapy  Darrol Poke Ibrahima Holberg 10/10/2020, 8:13 AM

## 2020-10-10 NOTE — Progress Notes (Signed)
Recreational Therapy Assessment and Plan  Patient Details  Name: [name] MRN: [id number] Date of Birth: 12/30/1934 Today's Date: 10/10/2020  Rehab Potential:  Good ELOS:   6/2  Assessment Hospital Problem: Principal Problem:   Intraparenchymal hemorrhage of brain [facility])   Past Medical History:      Past Medical History:  Diagnosis Date  . Arthritis   . CHF (congestive heart failure) ([place])   . Chronic diastolic heart failure ([place])   . CKD (chronic kidney disease) stage 3, GFR 30-59 ml/min ([facility])   . Essential hypertension   . GERD (gastroesophageal reflux disease)   . Hemorrhoids   . History of stroke   . Type 2 diabetes mellitus ([place])    Past Surgical History:       Past Surgical History:  Procedure Laterality Date  . BACK SURGERY    . CATARACT EXTRACTION W/PHACO Left 02/28/2013   Procedure: CATARACT EXTRACTION PHACO AND INTRAOCULAR LENS PLACEMENT (IOL) CDE=15.60;  Surgeon: Elta Guadeloupe T. Gershon Crane, MD;  Location: [facility] OR[facility];  Service: Ophthalmology;  Laterality: Left;  . CHOLECYSTECTOMY    . COLONOSCOPY  12/12/2003   RMR: Normal rectum.  Long capacious tortuous colon, colonic mucosa appeared normal  . COLONOSCOPY N/A 03/21/2014   Dr. [name]: internal and external hemorrhoids, torturous colon, colonic diverticulosis   . ESOPHAGOGASTRODUODENOSCOPY  12/28/2001   TUU:[id number] sliding hiatal hernia/. Three small bulbar ulcers, two with stigmata of bleeding and these were coagulated using heater probe unit   . ESOPHAGOGASTRODUODENOSCOPY N/A 03/21/2014   Dr. [name]: cervical esophageal web s/p dilation, negative H.pylori   . EYE SURGERY    . HIP FRACTURE SURGERY  2008  . JOINT REPLACEMENT     Rt hip, ????? sounds like just for fracture  . MALONEY DILATION N/A 03/21/2014   Procedure: [name] DILATION;  Surgeon: [name], MD;  Location: [facility] END[facility] SUITE;  Service: Endoscopy;  Laterality: N/A;  . SAVORY DILATION N/A 03/21/2014   Procedure: SAVORY DILATION;   Surgeon: [name], MD;  Location: [facility] END[facility] SUITE;  Service: Endoscopy;  Laterality: N/A;    Assessment & Plan Clinical Impression: Patient is a 86 y.o. year old female with history of previous stroke with right hemiparesis, chronic atrial fibrillation maintained on Eliquis, diabetes mellitus, diastolic congestive heart failure, CKD stage III. Per chart review she lives with her son. 1 level home ramped entrance. Used a rolling walker prior to admission as well as needing assistance for ADLs. She has multiple family in the area with good support. Presented 09/24/2020 with acute onset of headache as well as slurred speech. Denied any double vision. While in the ED noted some shortness of breath pulmonary edema noted on chest x-ray BiPAP initiated. Cranial CT scan suspicious for hemorrhage in the posterior fossa. CT angiogram of head and neck negative for large vessel occlusion. No vascular abnormalities. MRI of the brain redemonstrated acute intraparenchymal hemorrhage within the right side of the pons and within the inferior cerebellar vermis. Small amount of subarachnoid blood in the perimesencephalic cistern, intra foliar subarachnoid spaces of the posterior fossa and dependent within the occipital horn of the lateral ventricles. No evidence of increasing or new hemorrhage. No hydrocephalus. Admission chemistries unremarkable except potassium 2.3 glucose 217 creatinine 1.58 urine drug screen negative. Echocardiogram with ejection fraction of 55 to 34% grade 2 diastolic dysfunction. Maintained on Cleviprex for blood pressure control. Patient did receive Andexxa to reverse her Eliquis. Maintained on a regular consistency diet. Due to patient decreased functional mobility and  slurred speech she was admitted for a comprehensive rehab program. Medically stable for CIR today.  Pt presents with decreased activity tolerance, decreased functional mobility, decreased balance Limiting pt's  independence with leisure/community pursuits.  Met with pt today to discuss leisure interests, pt states she was fairly sedentary PTA.  Pt states fatigue from current schedule of therapies.  No further TR at this time.  Recommendations for other services: None   Discharge Criteria: Patient will be discharged from TR if patient refuses treatment 3 consecutive times without medical reason.  If treatment goals not met, if there is a change in medical status, if patient makes no progress towards goals or if patient is discharged from hospital.  The above assessment, treatment plan, treatment alternatives and goals were discussed and mutually agreed upon: by patient  Amanda 10/10/2020, 3:08 PM

## 2020-10-10 NOTE — Plan of Care (Signed)
  Problem: Consults Goal: RH STROKE PATIENT EDUCATION Description: See Patient Education module for education specifics  Outcome: Progressing Goal: Nutrition Consult-if indicated Outcome: Progressing

## 2020-10-10 NOTE — Progress Notes (Signed)
Physical Therapy Session Note  Patient Details  Name: [name] MRN: [id number] Date of Birth: 1935-05-02  Today's Date: 10/10/2020 PT Individual Time: [id number] PT Individual Time Calculation (min): 25 min   Short Term Goals: Week 2:  PT Short Term Goal 1 (Week 2): Patient will complete bed <> wc tranfsers with LRAD and MaxA x2 PT Short Term Goal 2 (Week 2): Patient will complete bed mobility with no more than ModA PT Short Term Goal 3 (Week 2): Patient will drive [facility]WC [id number] with no more than ModA  Skilled Therapeutic Interventions/Progress Updates:    Pt greeted supine in bed at start of session. RN at bedside finishing up morning medications. Pt denies pain but she reports dizziness and not feeling well. She's agreeable to PT tx but requires convincing. Retrieved disposable pants and these were donned with totalA while supine in bed - encouraged her to use BUE's to assist with pulling over knees to hips but limited effort made. She required maxA for rolling in bed with difficulty achieving full sidelying. Began to initiate sitting EOB but pt defer's due to dizziness. BP assessed reading 156/54 while supine from L arm. With HOB in trendelenburg, required heavy totalA for supine scooting towards [facility] for repositioning in bed - pt with limited effort in assisting despite cues. Completed a few bed level exercises with AAROM for completion, 1x5 each, including hip abduction, heel slides, glut sets, and quad sets. Significant ROM limitation in LLE > RLE, education provided regarding importance of mobilization to improve ROM and overall tolerance to activity. Pt remained supine in bed with needs in reach at end of session with bed alarm activated. Pt made comfortable.  Therapy Documentation Precautions:  Precautions Precautions: Fall Precaution Comments: Goal SBP < 160, right LE hip discrepency, knee pain Restrictions Weight Bearing Restrictions: No General:    Therapy/Group: Individual  Therapy  Greer Wainright P Dajon Lazar PT 10/10/2020, 8:44 AM

## 2020-10-11 ENCOUNTER — Other Ambulatory Visit (HOSPITAL_COMMUNITY): Payer: Medicare HMO

## 2020-10-11 ENCOUNTER — Encounter (HOSPITAL_COMMUNITY): Payer: Self-pay | Admitting: Pulmonary Disease

## 2020-10-11 ENCOUNTER — Inpatient Hospital Stay (HOSPITAL_COMMUNITY)
Admission: AD | Admit: 2020-10-11 | Discharge: 2020-10-17 | DRG: 291 | Disposition: A | Discharge status: Skilled Nursing Facility | Payer: Medicare HMO | Source: Ambulatory Visit | Attending: Internal Medicine | Admitting: Internal Medicine

## 2020-10-11 ENCOUNTER — Inpatient Hospital Stay (HOSPITAL_COMMUNITY): Payer: Medicare HMO

## 2020-10-11 ENCOUNTER — Other Ambulatory Visit: Payer: Self-pay

## 2020-10-11 DIAGNOSIS — E118 Type 2 diabetes mellitus with unspecified complications: Secondary | ICD-10-CM | POA: Diagnosis present

## 2020-10-11 DIAGNOSIS — G629 Polyneuropathy, unspecified: Secondary | ICD-10-CM | POA: Diagnosis not present

## 2020-10-11 DIAGNOSIS — I639 Cerebral infarction, unspecified: Secondary | ICD-10-CM | POA: Diagnosis not present

## 2020-10-11 DIAGNOSIS — Z833 Family history of diabetes mellitus: Secondary | ICD-10-CM

## 2020-10-11 DIAGNOSIS — I5033 Acute on chronic diastolic (congestive) heart failure: Secondary | ICD-10-CM | POA: Diagnosis not present

## 2020-10-11 DIAGNOSIS — R000 Tachycardia, unspecified: Secondary | ICD-10-CM | POA: Diagnosis not present

## 2020-10-11 DIAGNOSIS — R0902 Hypoxemia: Secondary | ICD-10-CM | POA: Diagnosis not present

## 2020-10-11 DIAGNOSIS — E1165 Type 2 diabetes mellitus with hyperglycemia: Secondary | ICD-10-CM | POA: Diagnosis present

## 2020-10-11 DIAGNOSIS — Z8249 Family history of ischemic heart disease and other diseases of the circulatory system: Secondary | ICD-10-CM

## 2020-10-11 DIAGNOSIS — R0603 Acute respiratory distress: Secondary | ICD-10-CM | POA: Diagnosis not present

## 2020-10-11 DIAGNOSIS — J810 Acute pulmonary edema: Secondary | ICD-10-CM

## 2020-10-11 DIAGNOSIS — E119 Type 2 diabetes mellitus without complications: Secondary | ICD-10-CM | POA: Diagnosis not present

## 2020-10-11 DIAGNOSIS — J9600 Acute respiratory failure, unspecified whether with hypoxia or hypercapnia: Secondary | ICD-10-CM | POA: Diagnosis not present

## 2020-10-11 DIAGNOSIS — Z7901 Long term (current) use of anticoagulants: Secondary | ICD-10-CM | POA: Diagnosis not present

## 2020-10-11 DIAGNOSIS — I482 Chronic atrial fibrillation, unspecified: Secondary | ICD-10-CM | POA: Diagnosis not present

## 2020-10-11 DIAGNOSIS — I69351 Hemiplegia and hemiparesis following cerebral infarction affecting right dominant side: Secondary | ICD-10-CM

## 2020-10-11 DIAGNOSIS — E8770 Fluid overload, unspecified: Secondary | ICD-10-CM | POA: Diagnosis not present

## 2020-10-11 DIAGNOSIS — Z6841 Body Mass Index (BMI) 40.0 and over, adult: Secondary | ICD-10-CM

## 2020-10-11 DIAGNOSIS — D72829 Elevated white blood cell count, unspecified: Secondary | ICD-10-CM | POA: Diagnosis not present

## 2020-10-11 DIAGNOSIS — I517 Cardiomegaly: Secondary | ICD-10-CM | POA: Diagnosis not present

## 2020-10-11 DIAGNOSIS — N184 Chronic kidney disease, stage 4 (severe): Secondary | ICD-10-CM | POA: Diagnosis not present

## 2020-10-11 DIAGNOSIS — I4891 Unspecified atrial fibrillation: Secondary | ICD-10-CM | POA: Diagnosis not present

## 2020-10-11 DIAGNOSIS — I130 Hypertensive heart and chronic kidney disease with heart failure and stage 1 through stage 4 chronic kidney disease, or unspecified chronic kidney disease: Secondary | ICD-10-CM | POA: Diagnosis not present

## 2020-10-11 DIAGNOSIS — Z8379 Family history of other diseases of the digestive system: Secondary | ICD-10-CM

## 2020-10-11 DIAGNOSIS — K219 Gastro-esophageal reflux disease without esophagitis: Secondary | ICD-10-CM | POA: Diagnosis present

## 2020-10-11 DIAGNOSIS — I10 Essential (primary) hypertension: Secondary | ICD-10-CM | POA: Diagnosis not present

## 2020-10-11 DIAGNOSIS — E669 Obesity, unspecified: Secondary | ICD-10-CM | POA: Diagnosis present

## 2020-10-11 DIAGNOSIS — E785 Hyperlipidemia, unspecified: Secondary | ICD-10-CM | POA: Diagnosis not present

## 2020-10-11 DIAGNOSIS — Z79899 Other long term (current) drug therapy: Secondary | ICD-10-CM | POA: Diagnosis not present

## 2020-10-11 DIAGNOSIS — E1140 Type 2 diabetes mellitus with diabetic neuropathy, unspecified: Secondary | ICD-10-CM | POA: Diagnosis not present

## 2020-10-11 DIAGNOSIS — Y95 Nosocomial condition: Secondary | ICD-10-CM | POA: Diagnosis not present

## 2020-10-11 DIAGNOSIS — N1832 Chronic kidney disease, stage 3b: Secondary | ICD-10-CM | POA: Diagnosis not present

## 2020-10-11 DIAGNOSIS — I69122 Dysarthria following nontraumatic intracerebral hemorrhage: Secondary | ICD-10-CM | POA: Diagnosis not present

## 2020-10-11 DIAGNOSIS — J189 Pneumonia, unspecified organism: Secondary | ICD-10-CM | POA: Diagnosis not present

## 2020-10-11 DIAGNOSIS — J811 Chronic pulmonary edema: Secondary | ICD-10-CM | POA: Diagnosis not present

## 2020-10-11 DIAGNOSIS — Z794 Long term (current) use of insulin: Secondary | ICD-10-CM

## 2020-10-11 DIAGNOSIS — R2689 Other abnormalities of gait and mobility: Secondary | ICD-10-CM | POA: Diagnosis not present

## 2020-10-11 DIAGNOSIS — J9601 Acute respiratory failure with hypoxia: Secondary | ICD-10-CM | POA: Diagnosis present

## 2020-10-11 DIAGNOSIS — Z9049 Acquired absence of other specified parts of digestive tract: Secondary | ICD-10-CM | POA: Diagnosis not present

## 2020-10-11 DIAGNOSIS — I480 Paroxysmal atrial fibrillation: Secondary | ICD-10-CM | POA: Diagnosis not present

## 2020-10-11 DIAGNOSIS — R059 Cough, unspecified: Secondary | ICD-10-CM | POA: Diagnosis not present

## 2020-10-11 DIAGNOSIS — I5031 Acute diastolic (congestive) heart failure: Secondary | ICD-10-CM | POA: Diagnosis not present

## 2020-10-11 DIAGNOSIS — I69128 Other speech and language deficits following nontraumatic intracerebral hemorrhage: Secondary | ICD-10-CM | POA: Diagnosis not present

## 2020-10-11 DIAGNOSIS — R339 Retention of urine, unspecified: Secondary | ICD-10-CM | POA: Diagnosis not present

## 2020-10-11 DIAGNOSIS — D631 Anemia in chronic kidney disease: Secondary | ICD-10-CM | POA: Diagnosis not present

## 2020-10-11 DIAGNOSIS — Z9989 Dependence on other enabling machines and devices: Secondary | ICD-10-CM | POA: Diagnosis not present

## 2020-10-11 DIAGNOSIS — E1122 Type 2 diabetes mellitus with diabetic chronic kidney disease: Secondary | ICD-10-CM | POA: Diagnosis present

## 2020-10-11 DIAGNOSIS — I69121 Dysphasia following nontraumatic intracerebral hemorrhage: Secondary | ICD-10-CM | POA: Diagnosis not present

## 2020-10-11 DIAGNOSIS — J9811 Atelectasis: Secondary | ICD-10-CM | POA: Diagnosis not present

## 2020-10-11 DIAGNOSIS — I619 Nontraumatic intracerebral hemorrhage, unspecified: Secondary | ICD-10-CM | POA: Diagnosis not present

## 2020-10-11 DIAGNOSIS — I5032 Chronic diastolic (congestive) heart failure: Secondary | ICD-10-CM | POA: Diagnosis not present

## 2020-10-11 DIAGNOSIS — N183 Chronic kidney disease, stage 3 unspecified: Secondary | ICD-10-CM | POA: Diagnosis present

## 2020-10-11 DIAGNOSIS — F32A Depression, unspecified: Secondary | ICD-10-CM | POA: Diagnosis not present

## 2020-10-11 LAB — CBC
HCT: 34.8 % — ABNORMAL LOW (ref 36.0–46.0)
Hemoglobin: 11.3 g/dL — ABNORMAL LOW (ref 12.0–15.0)
MCH: 28.8 pg (ref 26.0–34.0)
MCHC: 32.5 g/dL (ref 30.0–36.0)
MCV: 88.8 fL (ref 80.0–100.0)
Platelets: 287 10*3/uL (ref 150–400)
RBC: 3.92 MIL/uL (ref 3.87–5.11)
RDW: 15.4 % (ref 11.5–15.5)
WBC: 19 10*3/uL — ABNORMAL HIGH (ref 4.0–10.5)
nRBC: 0 % (ref 0.0–0.2)

## 2020-10-11 LAB — COMPREHENSIVE METABOLIC PANEL
ALT: 23 U/L (ref 0–44)
AST: 19 U/L (ref 15–41)
Albumin: 3.1 g/dL — ABNORMAL LOW (ref 3.5–5.0)
Alkaline Phosphatase: 59 U/L (ref 38–126)
Anion gap: 12 (ref 5–15)
BUN: 35 mg/dL — ABNORMAL HIGH (ref 8–23)
CO2: 18 mmol/L — ABNORMAL LOW (ref 22–32)
Calcium: 8.4 mg/dL — ABNORMAL LOW (ref 8.9–10.3)
Chloride: 102 mmol/L (ref 98–111)
Creatinine, Ser: 1.86 mg/dL — ABNORMAL HIGH (ref 0.44–1.00)
GFR, Estimated: 26 mL/min — ABNORMAL LOW (ref >60)
Glucose, Bld: 264 mg/dL — ABNORMAL HIGH (ref 70–99)
Potassium: 4.3 mmol/L (ref 3.5–5.1)
Sodium: 132 mmol/L — ABNORMAL LOW (ref 135–145)
Total Bilirubin: 0.7 mg/dL (ref 0.3–1.2)
Total Protein: 7.1 g/dL (ref 6.5–8.1)

## 2020-10-11 LAB — GLUCOSE, CAPILLARY
Glucose-Capillary: 175 mg/dL — ABNORMAL HIGH (ref 70–99)
Glucose-Capillary: 265 mg/dL — ABNORMAL HIGH (ref 70–99)
Glucose-Capillary: 249 mg/dL — ABNORMAL HIGH (ref 70–99)
Glucose-Capillary: 292 mg/dL — ABNORMAL HIGH (ref 70–99)
Glucose-Capillary: 201 mg/dL — ABNORMAL HIGH (ref 70–99)

## 2020-10-11 LAB — TROPONIN I (HIGH SENSITIVITY): Troponin I (High Sensitivity): 29 ng/L — ABNORMAL HIGH (ref <18)

## 2020-10-11 MED ORDER — CLONIDINE HCL 0.1 MG PO TABS: 0.1000 mg | ORAL_TABLET | Freq: Three times a day (TID) | ORAL | 11 refills | Status: DC

## 2020-10-11 MED ORDER — TRAMADOL HCL 50 MG PO TABS: 50.0000 mg | ORAL_TABLET | Freq: Two times a day (BID) | ORAL | Status: DC | PRN

## 2020-10-11 MED ORDER — HYDRALAZINE HCL 50 MG PO TABS
100.0000 mg | ORAL_TABLET | Freq: Three times a day (TID) | ORAL | Status: DC
  Administered 2020-10-11 – 2020-10-17 (×18): 100 mg via ORAL
  Filled 2020-10-11 (×19): qty 2

## 2020-10-11 MED ORDER — FUROSEMIDE 10 MG/ML IJ SOLN
60.0000 mg | Freq: Once | INTRAMUSCULAR | Status: AC
  Administered 2020-10-11: 60 mg via INTRAVENOUS

## 2020-10-11 MED ORDER — FUROSEMIDE 10 MG/ML IJ SOLN
40.0000 mg | Freq: Once | INTRAMUSCULAR | Status: AC
  Administered 2020-10-11: 40 mg via INTRAVENOUS

## 2020-10-11 MED ORDER — FUROSEMIDE 10 MG/ML IJ SOLN
INTRAMUSCULAR | Status: AC
  Filled 2020-10-11: qty 2

## 2020-10-11 MED ORDER — INSULIN DETEMIR 100 UNIT/ML ~~LOC~~ SOLN: 22.0000 [IU] | Freq: Two times a day (BID) | SUBCUTANEOUS | Status: DC

## 2020-10-11 MED ORDER — DOCUSATE SODIUM 100 MG PO CAPS
100.0000 mg | ORAL_CAPSULE | Freq: Two times a day (BID) | ORAL | Status: DC | PRN
  Administered 2020-10-15 – 2020-10-17 (×2): 100 mg via ORAL
  Filled 2020-10-11 (×2): qty 1

## 2020-10-11 MED ORDER — INSULIN ASPART 100 UNIT/ML IJ SOLN
0.0000 [IU] | INTRAMUSCULAR | Status: DC
  Administered 2020-10-11: 5 [IU] via SUBCUTANEOUS
  Administered 2020-10-11: 8 [IU] via SUBCUTANEOUS
  Administered 2020-10-11: 5 [IU] via SUBCUTANEOUS
  Administered 2020-10-12: 3 [IU] via SUBCUTANEOUS
  Administered 2020-10-12: 11 [IU] via SUBCUTANEOUS
  Administered 2020-10-12 (×2): 2 [IU] via SUBCUTANEOUS
  Administered 2020-10-12: 5 [IU] via SUBCUTANEOUS
  Administered 2020-10-13: 6 [IU] via SUBCUTANEOUS
  Administered 2020-10-13 (×3): 5 [IU] via SUBCUTANEOUS
  Administered 2020-10-13 (×2): 8 [IU] via SUBCUTANEOUS
  Administered 2020-10-14: 3 [IU] via SUBCUTANEOUS
  Administered 2020-10-14 (×3): 5 [IU] via SUBCUTANEOUS
  Administered 2020-10-14: 2 [IU] via SUBCUTANEOUS
  Administered 2020-10-14: 3 [IU] via SUBCUTANEOUS
  Administered 2020-10-15 (×2): 15 [IU] via SUBCUTANEOUS
  Administered 2020-10-15 (×3): 3 [IU] via SUBCUTANEOUS
  Administered 2020-10-15: 11 [IU] via SUBCUTANEOUS
  Administered 2020-10-15: 8 [IU] via SUBCUTANEOUS
  Administered 2020-10-16: 3 [IU] via SUBCUTANEOUS
  Administered 2020-10-16: 5 [IU] via SUBCUTANEOUS

## 2020-10-11 MED ORDER — INSULIN DETEMIR 100 UNIT/ML ~~LOC~~ SOLN
44.0000 [IU] | Freq: Two times a day (BID) | SUBCUTANEOUS | Status: DC
  Filled 2020-10-11 (×2): qty 0.44

## 2020-10-11 MED ORDER — INSULIN DETEMIR 100 UNIT/ML ~~LOC~~ SOLN: 44.0000 [IU] | Freq: Two times a day (BID) | SUBCUTANEOUS | 11 refills | Status: DC

## 2020-10-11 MED ORDER — POLYETHYLENE GLYCOL 3350 17 G PO PACK: 17.0000 g | PACK | Freq: Every day | ORAL | Status: DC | PRN

## 2020-10-11 MED ORDER — INSULIN DETEMIR 100 UNIT/ML ~~LOC~~ SOLN
22.0000 [IU] | Freq: Two times a day (BID) | SUBCUTANEOUS | Status: DC
  Administered 2020-10-11 – 2020-10-17 (×12): 22 [IU] via SUBCUTANEOUS
  Filled 2020-10-11 (×16): qty 0.22

## 2020-10-11 MED ORDER — AMLODIPINE BESYLATE 10 MG PO TABS
10.0000 mg | ORAL_TABLET | Freq: Every day | ORAL | Status: DC
  Administered 2020-10-11 – 2020-10-17 (×7): 10 mg via ORAL
  Filled 2020-10-11 (×8): qty 1

## 2020-10-11 MED ORDER — FUROSEMIDE 10 MG/ML IJ SOLN
20.0000 mg | Freq: Once | INTRAMUSCULAR | Status: AC
  Administered 2020-10-11: 20 mg via INTRAVENOUS

## 2020-10-11 MED ORDER — PIPERACILLIN-TAZOBACTAM 3.375 G IVPB
3.3750 g | Freq: Three times a day (TID) | INTRAVENOUS | Status: DC
  Administered 2020-10-11 – 2020-10-16 (×15): 3.375 g via INTRAVENOUS
  Filled 2020-10-11 (×15): qty 50

## 2020-10-11 MED ORDER — CHLORHEXIDINE GLUCONATE CLOTH 2 % EX PADS
6.0000 | MEDICATED_PAD | Freq: Every day | CUTANEOUS | Status: DC
  Administered 2020-10-12 – 2020-10-17 (×6): 6 via TOPICAL

## 2020-10-11 MED ORDER — ISOSORBIDE MONONITRATE ER 60 MG PO TB24
60.0000 mg | ORAL_TABLET | Freq: Every day | ORAL | Status: DC
  Administered 2020-10-11 – 2020-10-17 (×7): 60 mg via ORAL
  Filled 2020-10-11 (×7): qty 1

## 2020-10-11 MED ORDER — PANTOPRAZOLE SODIUM 40 MG PO TBEC
40.0000 mg | DELAYED_RELEASE_TABLET | Freq: Every day | ORAL | Status: DC
  Administered 2020-10-11 – 2020-10-16 (×6): 40 mg via ORAL
  Filled 2020-10-11 (×6): qty 1

## 2020-10-11 MED ORDER — FUROSEMIDE 10 MG/ML IJ SOLN
80.0000 mg | Freq: Two times a day (BID) | INTRAMUSCULAR | Status: DC
  Administered 2020-10-11 – 2020-10-13 (×4): 80 mg via INTRAVENOUS
  Filled 2020-10-11 (×4): qty 8

## 2020-10-11 MED ORDER — DULOXETINE HCL 30 MG PO CPEP
30.0000 mg | ORAL_CAPSULE | Freq: Every day | ORAL | Status: DC
  Administered 2020-10-11 – 2020-10-17 (×7): 30 mg via ORAL
  Filled 2020-10-11 (×7): qty 1

## 2020-10-11 MED ORDER — ISOSORBIDE MONONITRATE ER 60 MG PO TB24: 60.0000 mg | ORAL_TABLET | Freq: Every day | ORAL | 11 refills | Status: DC

## 2020-10-11 MED ORDER — DICLOFENAC SODIUM 1 % EX GEL: 2.0000 g | Freq: Four times a day (QID) | CUTANEOUS | Status: DC

## 2020-10-11 MED ORDER — INSULIN ASPART 100 UNIT/ML IJ SOLN: 5.0000 [IU] | Freq: Three times a day (TID) | INTRAMUSCULAR | 11 refills | Status: DC

## 2020-10-11 MED ORDER — FUROSEMIDE 10 MG/ML IJ SOLN
INTRAMUSCULAR | Status: AC
  Filled 2020-10-11: qty 4

## 2020-10-11 MED ORDER — DULOXETINE HCL 30 MG PO CPEP: 30.0000 mg | ORAL_CAPSULE | Freq: Every day | ORAL | 3 refills | Status: AC

## 2020-10-11 NOTE — Progress Notes (Addendum)
This morning patient c/o nauseated and rectal pressure and requested suppository for bowel movent  Asap. Administered Dulcolax supp. as patient request. Few minutes after  patient c/o she can not breath in, her sp02 dropped to 73% in RA. Applied oxygen sp02 came up to 95% with 9/L oxygen. Called Rapid respose, PA Dan A. Arrived to the room, ordered IV lasix 40 mg, Stat EKG and CXR done.BP 205/98,P109, R 24      Patient slept good at night, c/o knee pain administered pain medicine.

## 2020-10-11 NOTE — Progress Notes (Signed)
RT called by Rapid Response RN at [id number] for pt in respiratory distress and need for bipap. Upon arrival to pt room pt on NRB mask at 15L. Pt with expiratory wheezes, coarse crackles and is diaphoretic with accessory muscle use. Pt placed on Bipap thru the [facility]. Critical care at pt bedside. Pt then transported from 4W06 to 2H05 on bipap. Report given to ICU RT. RT will continue to monitor and be available as needed.

## 2020-10-11 NOTE — Progress Notes (Signed)
PROGRESS NOTE   Subjective/Complaints: Notified by PA and nursing regarding deterioration in condition. Patient seen laying in bed this AM with respiratory distress, diaphoresis.  ROS: Unable to assess due to respiratory distress  Objective:   DG Chest 1 View  Result Date: 10/11/2020 CLINICAL DATA:  86 year old with cough and respiratory distress. EXAM: CHEST  1 VIEW COMPARISON:  09/24/2020 FINDINGS: Single view of the chest demonstrates stable enlargement of the cardiac silhouette. There is new airspace disease in both lungs, left side greater than right. Again noted are prominent interstitial lung markings. Trachea is midline. Negative for pneumothorax. Bone structures are stable. IMPRESSION: New bilateral airspace disease, left side greater than right. Pattern is suggestive for asymmetric pulmonary edema. Pneumonia is also in the differential diagnosis. Stable cardiomegaly. Electronically Signed   By: Markus Daft M.D.   On: 10/11/2020 07:34   DG Abd 1 View  Result Date: 10/09/2020 CLINICAL DATA:  Nausea, history chronic kidney disease, diabetes mellitus EXAM: ABDOMEN - 1 VIEW COMPARISON:  08/24/2014 FINDINGS: Tubal ligation clips in pelvis. Surgical clips RIGHT upper quadrant likely reflect prior cholecystectomy. Nonobstructive bowel gas pattern. Scattered gas within colon and small bowel. No bowel dilatation or bowel wall thickening. Osseous demineralization with multilevel degenerative changes of the lumbar spine. Orthopedic hardware and posttraumatic deformity proximal RIGHT femur. No urinary tract calcifications. IMPRESSION: Nonobstructive bowel gas pattern. Electronically Signed   By: Lavonia Dana M.D.   On: 10/09/2020 11:15   Recent Labs    10/09/20 1008  WBC 10.6*  HGB 10.5*  HCT 31.6*  PLT 260   Recent Labs    10/09/20 1008  NA 133*  K 5.0  CL 106  CO2 20*  GLUCOSE 198*  BUN 36*  CREATININE 1.60*  CALCIUM 8.6*     Intake/Output Summary (Last 24 hours) at 10/11/2020 0900 Last data filed at 10/11/2020 0608 Gross per 24 hour  Intake 1200 ml  Output --  Net 1200 ml        Physical Exam: Vital Signs Blood pressure (!) 152/69, pulse 91, temperature 98 F (36.7 C), resp. rate (!) 47, height '5\' 4"'$  (1.626 m), weight 103.8 kg, SpO2 100 %. Constitutional: + Distress. Vital signs reviewed. HENT: Normocephalic.  Atraumatic. Eyes: EOMI. No discharge. Cardiovascular: No JVD.  Tachycardia. Respiratory: Decreased breath sounds.  Increased work of breathing. GI: Non-distended.  BS +. Skin: Diaphoretic.  Intact. Vascular changes bilateral lower extremities Psych: Unable to assess.  Respiratory condition. Musc: No edema in extremities.  No tenderness in extremities. Neuro: Alert Motor: RUE: 4/5 proximal distal RLE: 0/5 proximal distal LLE: 3/5 proximal distal  Assessment/Plan: 1. Functional deficits which require 3+ hours per day of interdisciplinary therapy in a comprehensive inpatient rehab setting.  Physiatrist is providing close team supervision and 24 hour management of active medical problems listed below.  Physiatrist and rehab team continue to assess barriers to discharge/monitor patient progress toward functional and medical goals  Care Tool:  Bathing    Body parts bathed by patient: Chest,Abdomen,Face,Right upper leg,Left upper leg,Left arm,Right arm,Front perineal area   Body parts bathed by helper: Right lower leg,Left lower leg,Buttocks     Bathing assist Assist Level: Moderate Assistance -  Patient 50 - 74% (supine to sit)     Upper Body Dressing/Undressing Upper body dressing   What is the patient wearing?: Pull over shirt    Upper body assist Assist Level: Minimal Assistance - Patient > 75%    Lower Body Dressing/Undressing Lower body dressing      What is the patient wearing?: Pants     Lower body assist Assist for lower body dressing: Total Assistance - Patient <  25% (supine rolling side to side)     Toileting Toileting    Toileting assist Assist for toileting: Total Assistance - Patient < 25% (supine rolling)     Transfers Chair/bed transfer  Transfers assist     Chair/bed transfer assist level: 2 Helpers (max A of 1 and +2 min A) Chair/bed transfer assistive device: Sliding board   Locomotion Ambulation   Ambulation assist   Ambulation activity did not occur: Safety/medical concerns  Assist level: Total Assistance - Patient < 25% Assistive device: Lite Gait Max distance: 82f   Walk 10 feet activity   Assist  Walk 10 feet activity did not occur: Safety/medical concerns        Walk 50 feet activity   Assist Walk 50 feet with 2 turns activity did not occur: Safety/medical concerns         Walk 150 feet activity   Assist Walk 150 feet activity did not occur: Safety/medical concerns         Walk 10 feet on uneven surface  activity   Assist Walk 10 feet on uneven surfaces activity did not occur: Safety/medical concerns         Wheelchair     Assist Will patient use wheelchair at discharge?: Yes Type of Wheelchair: Power    Wheelchair assist level: Minimal Assistance - Patient > 75% Max wheelchair distance: 1554f   Wheelchair 50 feet with 2 turns activity    Assist        Assist Level: Minimal Assistance - Patient > 75%   Wheelchair 150 feet activity     Assist      Assist Level: Minimal Assistance - Patient > 75%   Blood pressure (!) 152/69, pulse 91, temperature 98 F (36.7 C), resp. rate (!) 47, height '5\' 4"'$  (1.626 m), weight 103.8 kg, SpO2 100 %.    Medical Problem List and Plan: 1.  Headache with slurred speech and decreased functional mobility secondary to intraparenchymal hemorrhage involving the right pons and cerebellar vermis, SAH as well as history of CVA with residual right-sided weakness  Unable to tolerate CIR, plan for transfer to acute floor given decline in  medical condition with respiratory distress requiring nonrebreather with plans for BiPAP 2.  Antithrombotics: -DVT/anticoagulation: SCDs             -antiplatelet therapy: N/A 3. Pain Management: Tylenol as needed, Bilateral knee pain likely OA check Xrays will likely need cortisone inj, start voltaren gel and tramadol , end stage with reduced ROM would benefit from corticosteroid injection but pt would like to hold off for now  Continue Voltaren gel and tramadol, is only taking once a day, add duloxetine for chronic knee pain 4. Mood: Provide emotional support             -antipsychotic agents: N/A 5. Neuropsych: This patient he is capable of making decisions on her own behalf. 6. Skin/Wound Care: Routine skin checks 7. Fluids/Electrolytes/Nutrition: Routine in and outs 8.  Dysphagia.  Dysphagia #2 thin liquids.  Follow-up speech  therapy 9.  Hypertension:   Continue Norvasc 10 mg daily, Imdur 60 mg daily, hydralazine 100 mg every 8 hours.  Monitor with increased mobility  Clonidine started on 5/21 Vitals:   10/11/20 0745 10/11/20 0808  BP: (!) 152/66 (!) 152/69  Pulse: 95 91  Resp: (!) 47   Temp:    SpO2: 100% 100%   Elevated on 5/27, monitor in acute hospital 10.  History of atrial fibrillation.  Chronic Eliquis reversed with Andexxa.  Continue to hold blood thinner due to [facility].  Cardiac rate controlled 11.  Diabetes mellitus with hyperglycemia.  Hemoglobin A1c 9.9.    Increase NovoLog to 5 units 3 times daily  Check blood sugars before meals and at bedtime CBG (last 3)  Recent Labs    10/10/20 1635 10/10/20 2109 10/11/20 0615  GLUCAP 200* 212* 175*   Levimir increased to 44U on 5/23, good range this am , monitor on current dose   Remains elevated on 5/27, further adjustments in acute hospital 12.  Diastolic congestive heart failure.  No signs of fluid overload Filed Weights   09/30/20 1823  Weight: 103.8 kg   13.  CKD stage IIIb.    Creatinine 1.60 on 5/25 14.  GERD.   Continue Protonix 15.  Slow transit constipation  Bowel meds increased on 5/21, manual disimpaction 5/24, KUB, relatively unremarkable 16.  Respiratory distress  See above  ECG showing sinus tachycardia with significant artifact  Chest x-ray showing significant edema  Plan to transfer to acute service for further monitoring and interventions  LOS: 11 days A FACE TO FACE EVALUATION WAS PERFORMED  Jerrad Mendibles Lorie Phenix 10/11/2020, 9:00 AM

## 2020-10-11 NOTE — Progress Notes (Signed)
Inpatient Rehabilitation Care Coordinator Discharge Note  The overall goal for the admission was met for:   Discharge location: Yes, Transfer to Acute   Length of Stay: Yes  Discharge activity level: Yes  Home/community participation: Yes  Services provided included: MD, RD, PT, OT, SLP, RN, CM, TR, Pharmacy, Neuropsych and SW  Financial Services: Private Insurance: [facility]  Choices offered to/list presented to:N/a  Follow-up services arranged: Other: TBD  Comments (or additional information):  Patient/Family verbalized understanding of follow-up arrangements: Yes  Individual responsible for coordination of the follow-up plan: Aniceto Boss (Daughter), [phone]  Confirmed correct DME delivered: Dyanne Iha 10/11/2020    Dyanne Iha

## 2020-10-11 NOTE — H&P (Signed)
NAME:  [name], MRN:  [id number], DOB:  02/15/1935, LOS: 0 ADMISSION DATE:  10/11/2020, CONSULTATION DATE: 10/11/2020 REFERRING MD:  Donia Guiles, PA, CHIEF COMPLAINT:  resp distress   History of Present Illness:  This is an 86 year old female, history of previous stroke right-sided chronic atrial fibrillation on Eliquis, diabetes, chronic diastolic heart failure, CKD stage III.  Patient initially presented to the hospital on May 10 with acute onset headache, slurred speech.  Patient had MRI which redemonstrated acute intraparenchymal hemorrhage on the right side of the pons and inferior cerebellar vermis.  She also had a small subarachnoid bleed.  Patient had blood pressure control with Cleviprex, she was on Eliquis and did receive andexanet alfa for reversal.  Ultimately she had a decreased functional ability and decision was made for admission to the rehab facility.  Patient's hospital course during inpatient rehab is included PT, SLP and OT.  Rate controlled A. fib.  Eliquis remains on hold.  Blood pressures been treated with Norvasc, Imdur and hydralazine plus clonidine.  Lasix for her diastolic heart failure and CKD 3.  On the morning of 10/11/2020 patient was found to be in respiratory distress and hypoxemic. Pulmonary critical care was consulted for evaluation.  Patient had a chest x-ray that was completed this morning that revealed bilateral airspace disease concerning for pneumonia versus pulmonary edema.  Decision was made for transfer to the intensive care unit after being placed on BiPAP for support  Pertinent  Medical History   Past Medical History:  Diagnosis Date  . Arthritis   . CHF (congestive heart failure) ([place])   . Chronic diastolic heart failure ([place])   . CKD (chronic kidney disease) stage 3, GFR 30-59 ml/min (HCC)   . Essential hypertension   . GERD (gastroesophageal reflux disease)   . Hemorrhoids   . History of stroke   . Type 2 diabetes mellitus ([place])       Significant Hospital Events: Including procedures, antibiotic start and stop dates in addition to other pertinent events   . 10/11/2020: BiPAP transferred to ICU  Interim History / Subjective:  Patient feels short of breath.  Better on BiPAP, complains of difficulty breathing.  Objective   SpO2 97 %.    Vent Mode: BIPAP;PSV FiO2 (%):  [40 %] 40 % PEEP:  [8 cmH20] 8 cmH20 Pressure Support:  [14 cmH20] 14 cmH20  No intake or output data in the 24 hours ending 10/11/20 0902 There were no vitals filed for this visit.  Examination: General: Obese chronically ill-appearing female on BiPAP, diaphoretic HENT: BiPAP mask in place, tracking Lungs: Rhonchi, crackles bilaterally Cardiovascular: Tachycardic, regular rate rhythm S1-S2 Abdomen: Obese, abdominal breathing pattern, nontender Extremities: Bilateral lower extremity edema Neuro: Alert, follows commands, will weakness on the left side GU: Pure wick in place  Labs/imaging that I havepersonally reviewed  (right click and "Reselect all SmartList Selections" daily)   Labs from this morning have yet to be Dron.  These are pending. Labs from 10/09/2020 Sodium 133 Potassium 5.0 BUN 36 Serum creatinine 1.60 White blood cell count 10.6 Hemoglobin 10.5 Platelets 260  Resolved Hospital Problem list     Assessment & Plan:   Acute hypoxemic respiratory failure requiring BiPAP Acute bilateral pulmonary edema, chest x-ray with bilateral infiltrates No obvious sign of infection but hospital-acquired pneumonia remains on differential. Acute diastolic heart failure CKD 3 with volume overload Hypertension Plan: Try to temporize on BiPAP. Lasix '80mg'$  BID  Continue hydralazine, Imdur, Norvasc Repeat labs pending CBC, CMP,  trop  Type 2 diabetes, hyperglycemia N.p.o. currently Plan: Cut Levemir dosing to half 22 units twice daily Continue CBGs with SSI coverage as needed Hopefully can advance diet when she is able to tolerate  coming off of BiPAP.  Right pons intraparenchymal hemorrhage, history of CVA with residual right-sided weakness Plan: Supportive care Hopefully back to rehab in future.  Gastroesophageal reflux P: Continue Protonix    Best practice (right click and "Reselect all SmartList Selections" daily)  Diet:  NPO Pain/Anxiety/Delirium protocol (if indicated): No VAP protocol (if indicated): Not indicated DVT prophylaxis: LMWH GI prophylaxis: N/A and PPI Glucose control:  SSI Yes Central venous access:  N/A Arterial line:  N/A Foley:  Yes, and it is still needed Mobility:  bed rest  PT consulted: Yes Last date of multidisciplinary goals of care discussion [n/a] Code Status:  full code Disposition: ICU   Labs   CBC: Recent Labs  Lab 10/09/20 1008  WBC 10.6*  NEUTROABS 8.7*  HGB 10.5*  HCT 31.6*  MCV 87.5  PLT [id number]    Basic Metabolic Panel: Recent Labs  Lab 10/09/20 1008  NA 133*  K 5.0  CL 106  CO2 20*  GLUCOSE 198*  BUN 36*  CREATININE 1.60*  CALCIUM 8.6*   GFR: Estimated Creatinine Clearance: 30.7 mL/min (A) (by C-G formula based on SCr of 1.6 mg/dL (H)). Recent Labs  Lab 10/09/20 1008  WBC 10.6*    Liver Function Tests: Recent Labs  Lab 10/09/20 1008  AST 17  ALT 21  ALKPHOS 47  BILITOT 0.6  PROT 6.8  ALBUMIN 3.0*   No results for input(s): LIPASE, AMYLASE in the last 168 hours. No results for input(s): AMMONIA in the last 168 hours.  ABG    Component Value Date/Time   PHART 7.310 (L) 07/25/2019 0620   PCO2ART 51.6 (H) 07/25/2019 0620   PO2ART 87.1 07/25/2019 0620   HCO3 23.7 07/25/2019 0620   TCO2 15 (L) 09/24/2020 2020   ACIDBASEDEF 0.3 07/25/2019 0620   O2SAT 95.8 07/25/2019 0620     Coagulation Profile: No results for input(s): INR, PROTIME in the last 168 hours.  Cardiac Enzymes: No results for input(s): CKTOTAL, CKMB, CKMBINDEX, TROPONINI in the last 168 hours.  HbA1C: Hgb A1c MFr Bld  Date/Time Value Ref Range Status   09/26/2020 03:23 AM 9.9 (H) 4.8 - 5.6 % Final    Comment:    (NOTE) Pre diabetes:          5.7%-6.4%  Diabetes:              >6.4%  Glycemic control for   <7.0% adults with diabetes   02/08/2020 10:03 AM 8.9 (H) 4.8 - 5.6 % Final    Comment:    (NOTE) Pre diabetes:          5.7%-6.4%  Diabetes:              >6.4%  Glycemic control for   <7.0% adults with diabetes     CBG: Recent Labs  Lab 10/10/20 0624 10/10/20 1132 10/10/20 1635 10/10/20 2109 10/11/20 0615  GLUCAP 163* 234* 200* 212* 175*    Review of Systems:   Unable to be obtained secondary to critical illness  Past Medical History:  She,  has a past medical history of Arthritis, CHF (congestive heart failure) (HCC), Chronic diastolic heart failure ([place]), CKD (chronic kidney disease) stage 3, GFR 30-59 ml/min (HCC), Essential hypertension, GERD (gastroesophageal reflux disease), Hemorrhoids, History of stroke, and Type 2 diabetes  mellitus ([place]).   Surgical History:   Past Surgical History:  Procedure Laterality Date  . BACK SURGERY    . CATARACT EXTRACTION W/PHACO Left 02/28/2013   Procedure: CATARACT EXTRACTION PHACO AND INTRAOCULAR LENS PLACEMENT (IOL) CDE=15.60;  Surgeon: Elta Guadeloupe T. Gershon Crane, MD;  Location: [facility];  Service: Ophthalmology;  Laterality: Left;  . CHOLECYSTECTOMY    . COLONOSCOPY  12/12/2003   RMR: Normal rectum.  Long capacious tortuous colon, colonic mucosa appeared normal  . COLONOSCOPY N/A 03/21/2014   Dr. [name]: internal and external hemorrhoids, torturous colon, colonic diverticulosis   . ESOPHAGOGASTRODUODENOSCOPY  12/28/2001   [id number] sliding hiatal hernia/. Three small bulbar ulcers, two with stigmata of bleeding and these were coagulated using heater probe unit   . ESOPHAGOGASTRODUODENOSCOPY N/A 03/21/2014   Dr. [name]: cervical esophageal web s/p dilation, negative H.pylori   . EYE SURGERY    . HIP FRACTURE SURGERY  2008  . JOINT REPLACEMENT     Rt hip, ????? sounds like just for  fracture  . MALONEY DILATION N/A 03/21/2014   Procedure: [name] DILATION;  Surgeon: [name], MD;  Location: [facility] SUITE;  Service: Endoscopy;  Laterality: N/A;  . SAVORY DILATION N/A 03/21/2014   Procedure: SAVORY DILATION;  Surgeon: [name], MD;  Location: [facility] END[facility] SU[facility];  Service: Endoscopy;  Laterality: N/A;     Social History:   reports that she has never smoked. She has never used smokeless tobacco. She reports that she does not drink alcohol and does not use drugs.   Family History:  Her family history includes Crohn's disease in her daughter; Diabetes in her brother, sister, and sister; Heart attack in her sister; Hypertension in her mother and sister. There is no history of Colon cancer.   Allergies No Known Allergies   Home Medications  Prior to Admission medications   Medication Sig Start Date End Date Taking? Authorizing Provider  acetaminophen (TYLENOL) 325 MG tablet Take 2 tablets (650 mg total) by mouth every 6 (six) hours as needed for mild pain, fever or headache (or Fever >/= 101). 07/31/19   Roxan Hockey, MD  amLODipine (NORVASC) 10 MG tablet Take 1 tablet (10 mg total) by mouth daily. 09/29/20   Patrecia Pour, MD  cloNIDine (CATAPRES) 0.1 MG tablet Take 1 tablet (0.1 mg total) by mouth 3 (three) times daily. 10/11/20   Angiulli, Lavon Paganini, PA-C  diclofenac Sodium (VOLTAREN) 1 % GEL Apply 2 g topically 4 (four) times daily. 10/11/20   Angiulli, Lavon Paganini, PA-C  DULoxetine (CYMBALTA) 30 MG capsule Take 1 capsule (30 mg total) by mouth daily. 10/11/20   Angiulli, Lavon Paganini, PA-C  hydrALAZINE (APRESOLINE) 100 MG tablet Take 1 tablet (100 mg total) by mouth 3 (three) times daily. 09/30/20   Patrecia Pour, MD  hydrocortisone (ANUSOL-HC) 2.5 % rectal cream Place 1 application rectally 2 (two) times daily as needed for hemorrhoids or anal itching. 01/03/20   Barton Dubois, MD  insulin aspart (NOVOLOG) 100 UNIT/ML FlexPen Inject 0-10 Units into the skin 3 (three) times  daily with meals. insulin aspart (novoLOG) injection 0-10 Units 0-10 Units Subcutaneous, 3 times daily with meals CBG < 70: Implement Hypoglycemia Standing Orders and refer to Hypoglycemia Standing Orders sidebar report  CBG 70 - 120: 0 unit CBG 121 - 150: 0 unit  CBG 151 - 200: 1 unit CBG 201 - 250: 2 units CBG 251 - 300: 4 units CBG 301 - 350: 6 units  CBG 351 - 400:  8 units  CBG > 400: 10 units 07/31/19   Emokpae, Courage, MD  insulin aspart (NOVOLOG) 100 UNIT/ML injection Inject 5 Units into the skin 3 (three) times daily with meals. 10/11/20   Angiulli, Lavon Paganini, PA-C  insulin detemir (LEVEMIR) 100 UNIT/ML injection Inject 0.44 mLs (44 Units total) into the skin 2 (two) times daily. 10/11/20   Angiulli, Lavon Paganini, PA-C  isosorbide mononitrate (IMDUR) 60 MG 24 hr tablet Take 1 tablet (60 mg total) by mouth daily. 10/11/20 10/11/21  Angiulli, Lavon Paganini, PA-C  ondansetron (ZOFRAN) 4 MG tablet Take 4 mg by mouth every 6 (six) hours as needed for nausea or vomiting.    [provider]  pantoprazole (PROTONIX) 40 MG tablet Take 1 tablet by mouth daily. 09/06/20   [provider]  traMADol (ULTRAM) 50 MG tablet Take 1 tablet (50 mg total) by mouth every 12 (twelve) hours as needed for severe pain. 10/11/20   Angiulli, Lavon Paganini, PA-C     This patient is critically ill with multiple organ system failure; which, requires frequent high complexity decision making, assessment, support, evaluation, and titration of therapies. This was completed through the application of advanced monitoring technologies and extensive interpretation of multiple databases. During this encounter critical care time was devoted to patient care services described in this note for 78 minutes.  Garner Nash, DO [facility] 10/11/2020 12:57 PM

## 2020-10-11 NOTE — Progress Notes (Signed)
Physical Therapy Note  Patient Details  Name: [name] MRN: [id number] Date of Birth: Sep 19, 1934 Today's Date: 10/11/2020    Patient with unexpected d/c to acute care. Unable to be seen for PT today.    Karoline Caldwell, PT, DPT, CBIS  10/11/2020, 8:18 AM

## 2020-10-11 NOTE — Significant Event (Addendum)
Rapid Response Event Note   Reason for Call :  Acute respiratory distress  Initial Focused Assessment:  Pt lying in bed, alert. Difficulty speaking d/t work of breathing, but nods appropriately. Accessory muscle use. Lung sounds are rhonchus throughout. Skin is warm, pink, pt is significantly diaphoretic.   VS: BP 194/79, HR 108, RR 40, SpO2 99% on 100% NRB CBG 175  Interventions:  -Total of '120mg'$  IV Lasix -CXR -CMP, CBC, Trop -EKG -BiPAP 40%/ 8 PEEP  -PCCM consult placed, transfer order written for ICU  Plan of Care:  -Admit to ICU  Event Summary:  MD Notified: D. Fordyce, [place] Call Time: [phone]- my RRT colleague initially responded Arrival Time: 0705- I arrived on shift and took over care End Time: [place], RN

## 2020-10-11 NOTE — Progress Notes (Signed)
Patient transferred to 2 heart o5 report given to Karie Kirks.

## 2020-10-11 NOTE — Progress Notes (Signed)
Pharmacy Antibiotic Note  [name] is a 87 y.o. female admitted on 10/11/2020 with aspiration pneumoniawith respiratory distress requiring BiPAP.  Pharmacy has been consulted for Zosyn dosing. This is day 1 of therapy. The patient CXR shows assymmetric pulmonary edema with PNA on differential diagnosis. WBC 19.0, afeb, CrCl 26.67m/min.   Plan: Zosyn 3.375g IV q8h (4 hour infusion).  F/u cultures, clinical progress, renal function  F/u length of therapy   Weight: 106.7 kg (235 lb 3.7 oz)  Temp (24hrs), Avg:98.4 F (36.9 C), Min:98 F (36.7 C), Max:98.6 F (37 C)  Recent Labs  Lab 10/09/20 1008 10/11/20 0812  WBC 10.6* 19.0*  CREATININE 1.60* 1.86*    Estimated Creatinine Clearance: 26.8 mL/min (A) (by C-G formula based on SCr of 1.86 mg/dL (H)).    No Known Allergies  Antimicrobials this admission: Zosyn 5/27>   Microbiology results: 5/11 MRSA neg   Thank you for allowing pharmacy to be a part of this patient's care.  MWilson Singer PharmD PGY1 Pharmacy Resident 10/11/2020 2:17 PM

## 2020-10-11 NOTE — Discharge Summary (Signed)
Physician Discharge Summary  Patient ID: [name] MRN: [id number] DOB/AGE: Apr 19, 1935 86 y.o.  Admit date: 09/30/2020 Discharge date: 10/11/2020  Discharge Diagnoses:  Principal Problem:   Intraparenchymal hemorrhage of brain [facility]) Active Problems:   Controlled type 2 diabetes mellitus with hyperglycemia, with long-term current use of insulin (HCC) Dysphagia Hypertension Diastolic congestive heart failure Atrial fibrillation CKD stage III GERD Slow transit constipation  Discharged Condition: guarded  Significant Diagnostic Studies: CT Angio Head W or Wo Contrast  Result Date: 09/24/2020 CLINICAL DATA:  Initial evaluation for acute intracranial hemorrhage. EXAM: CT ANGIOGRAPHY HEAD AND NECK TECHNIQUE: Multidetector CT imaging of the head and neck was performed using the standard protocol during bolus administration of intravenous contrast. Multiplanar CT image reconstructions and MIPs were obtained to evaluate the vascular anatomy. Carotid stenosis measurements (when applicable) are obtained utilizing NASCET criteria, using the distal internal carotid diameter as the denominator. CONTRAST:  46m OMNIPAQUE IOHEXOL 350 MG/ML SOLN COMPARISON:  Prior head CT from earlier the same day. FINDINGS: CTA NECK FINDINGS Aortic arch: Visualized aortic arch normal in caliber. Origin of the great vessels incompletely assessed on this exam. Atheromatous change about the origin of the left subclavian artery without significant stenosis. Right carotid system: Right CCA patent from its origin to the bifurcation without stenosis. Scattered calcified plaque about the right carotid bulb without significant stenosis. Right ICA patent distally without stenosis, dissection or occlusion. Left carotid system: Origin of the left CCA not visualized. Visualized left CCA patent to the bifurcation without stenosis. Eccentric calcified plaque at the left carotid bulb without significant stenosis. Left ICA patent distally  without stenosis, dissection or occlusion. Vertebral arteries: Both vertebral arteries arise from subclavian arteries. Right vertebral artery dominant. Vertebral arteries patent within the neck without stenosis, dissection or occlusion. Skeleton: No visible acute osseous abnormality. No discrete or worrisome osseous lesions. 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 Other neck: No other visible acute soft tissue abnormality within the neck. Enlarged multinodular goiter, with dominant 2.6 cm right thyroid nodule. This has been evaluated on previous imaging in 2013. (ref: J Am Coll Radiol. 2015 Feb;12(2): 143-50). No other mass or adenopathy. Upper chest: Diffuse interlobular septal thickening seen within the visualized lungs, suggesting pulmonary interstitial edema. Visualized upper chest demonstrates no other acute finding. Review of the MIP images confirms the above findings CTA HEAD FINDINGS Anterior circulation: Petrous segments patent bilaterally. Extensive atheromatous change throughout the carotid siphons with associated moderate to advanced diffuse narrowing. A1 segments patent bilaterally. Normal anterior communicating artery complex. Anterior cerebral arteries patent to their distal aspects without stenosis. No M1 stenosis or occlusion. Normal MCA bifurcations. Distal MCA branches well perfused and symmetric. Posterior circulation: Atheromatous change within the mid V4 segments bilaterally with associated moderate stenoses. Left PICA origin patent and normal. Right PICA not seen. Basilar patent to its distal aspect without stenosis. Superior cerebellar arteries patent bilaterally. Left PCA supplied via the basilar. Predominant fetal type origin of the right PCA. PCAs patent to their distal aspects without stenosis. Venous sinuses: Patent allowing for timing the contrast bolus. Anatomic variants: Predominant fetal type origin of the right PCA. No intracranial aneurysm. No vascular  abnormality seen underlying the hemorrhages at the pons and cerebellum. Review of the MIP images confirms the above findings IMPRESSION: 1. Negative CTA for large vessel occlusion. No vascular abnormality seen underlying the hemorrhages at the pons and cerebellum. 2. Extensive atheromatous change throughout the carotid siphons with associated moderate to advanced diffuse narrowing. 3.  Atheromatous change about the V4 segments bilaterally with associated moderate stenoses. 4. Diffuse interlobular septal thickening within the visualized lungs, suggesting pulmonary interstitial edema. Electronically Signed   By: Jeannine Boga M.D.   On: 09/24/2020 21:57   DG Chest 1 View  Result Date: 10/11/2020 CLINICAL DATA:  86 year old with cough and respiratory distress. EXAM: CHEST  1 VIEW COMPARISON:  09/24/2020 FINDINGS: Single view of the chest demonstrates stable enlargement of the cardiac silhouette. There is new airspace disease in both lungs, left side greater than right. Again noted are prominent interstitial lung markings. Trachea is midline. Negative for pneumothorax. Bone structures are stable. IMPRESSION: New bilateral airspace disease, left side greater than right. Pattern is suggestive for asymmetric pulmonary edema. Pneumonia is also in the differential diagnosis. Stable cardiomegaly. Electronically Signed   By: Markus Daft M.D.   On: 10/11/2020 07:34   DG Knee 1-2 Views Left  Result Date: 10/01/2020 CLINICAL DATA:  Chronic bilateral knee pain. EXAM: LEFT KNEE - 1-2 VIEW COMPARISON:  Right knee 10/01/2020 FINDINGS: Two views of the left knee demonstrate severe joint space loss in the medial and lateral knee compartments. Near complete joint space loss in the medial knee compartment. Severe osteophytosis throughout the knee. Extensive degenerative changes at the patellofemoral compartment. Evidence for moderate sized suprapatellar joint effusion. Overall alignment of the knee is normal without a  fracture or dislocation. IMPRESSION: Severe joint space loss and osteophytosis throughout the left knee. Findings are compatible with severe osteoarthritis with a joint effusion. No acute bone abnormality. Electronically Signed   By: Markus Daft M.D.   On: 10/01/2020 13:31   DG Knee 1-2 Views Right  Result Date: 10/01/2020 CLINICAL DATA:  Bilateral chronic knee pain. Pain with weight-bearing. EXAM: RIGHT KNEE - 1-2 VIEW COMPARISON:  None. FINDINGS: There is significant joint space narrowing and osteophyte formation involving the MEDIAL and patellofemoral compartments and to a lesser degree the LATERAL compartment. No acute fracture or joint effusion. Note is made of atherosclerotic calcification of popliteal artery. IMPRESSION: Significant degenerative changes. No evidence for acute  abnormality. Electronically Signed   By: Nolon Nations M.D.   On: 10/01/2020 13:30   DG Abd 1 View  Result Date: 10/09/2020 CLINICAL DATA:  Nausea, history chronic kidney disease, diabetes mellitus EXAM: ABDOMEN - 1 VIEW COMPARISON:  08/24/2014 FINDINGS: Tubal ligation clips in pelvis. Surgical clips RIGHT upper quadrant likely reflect prior cholecystectomy. Nonobstructive bowel gas pattern. Scattered gas within colon and small bowel. No bowel dilatation or bowel wall thickening. Osseous demineralization with multilevel degenerative changes of the lumbar spine. Orthopedic hardware and posttraumatic deformity proximal RIGHT femur. No urinary tract calcifications. IMPRESSION: Nonobstructive bowel gas pattern. Electronically Signed   By: Lavonia Dana M.D.   On: 10/09/2020 11:15   CT Head Wo Contrast  Addendum Date: 09/24/2020   ADDENDUM REPORT: 09/24/2020 20:39 ADDENDUM: The patient was subsequently sedated and return for repeat imaging which demonstrates evidence of a parenchymal hemorrhage in the right half of the pons with suggestion of local extension into the prepontine cistern. The density seen in the midline posteriorly  may represent some dependent spread of hemorrhage although some artifact does remain. Critical Value/emergent results were called by telephone at the time of interpretation on 09/24/2020 at 8:39 pm to Dr. Varney Biles , who verbally acknowledged these results. Electronically Signed   By: Inez Catalina M.D.   On: 09/24/2020 20:39   Result Date: 09/24/2020 CLINICAL DATA:  Headaches, no known injury, initial encounter EXAM: CT HEAD  WITHOUT CONTRAST TECHNIQUE: Contiguous axial images were obtained from the base of the skull through the vertex without intravenous contrast. COMPARISON:  05/02/2008 FINDINGS: Brain: Images are significantly limited by patient motion artifact. Mild atrophic changes and chronic white matter ischemic changes are seen. There are areas of increased attenuation anterior to the pons as well as posteriorly in the midline in the cerebellum. These are likely related to artifact although the possibility of underlying hemorrhage could not be totally excluded on this exam. Repeat imaging when the patient can better tolerate the exam is recommended. Vascular: No hyperdense vessel or unexpected calcification. Skull: Normal. Negative for fracture or focal lesion. Sinuses/Orbits: No acute finding. Other: None. IMPRESSION: Significantly limited exam. There are findings suspicious for hemorrhage in the posterior fossa and given the clinical history repeat imaging with sedation is recommended to allow the patient to better tolerate the procedure. No other focal abnormality is noted. Electronically Signed: By: Inez Catalina M.D. On: 09/24/2020 19:55   CT Angio Neck W and/or Wo Contrast  Result Date: 09/24/2020 CLINICAL DATA:  Initial evaluation for acute intracranial hemorrhage. EXAM: CT ANGIOGRAPHY HEAD AND NECK TECHNIQUE: Multidetector CT imaging of the head and neck was performed using the standard protocol during bolus administration of intravenous contrast. Multiplanar CT image reconstructions and  MIPs were obtained to evaluate the vascular anatomy. Carotid stenosis measurements (when applicable) are obtained utilizing NASCET criteria, using the distal internal carotid diameter as the denominator. CONTRAST:  41m OMNIPAQUE IOHEXOL 350 MG/ML SOLN COMPARISON:  Prior head CT from earlier the same day. FINDINGS: CTA NECK FINDINGS Aortic arch: Visualized aortic arch normal in caliber. Origin of the great vessels incompletely assessed on this exam. Atheromatous change about the origin of the left subclavian artery without significant stenosis. Right carotid system: Right CCA patent from its origin to the bifurcation without stenosis. Scattered calcified plaque about the right carotid bulb without significant stenosis. Right ICA patent distally without stenosis, dissection or occlusion. Left carotid system: Origin of the left CCA not visualized. Visualized left CCA patent to the bifurcation without stenosis. Eccentric calcified plaque at the left carotid bulb without significant stenosis. Left ICA patent distally without stenosis, dissection or occlusion. Vertebral arteries: Both vertebral arteries arise from subclavian arteries. Right vertebral artery dominant. Vertebral arteries patent within the neck without stenosis, dissection or occlusion. Skeleton: No visible acute osseous abnormality. No discrete or worrisome osseous lesions. 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 Other neck: No other visible acute soft tissue abnormality within the neck. Enlarged multinodular goiter, with dominant 2.6 cm right thyroid nodule. This has been evaluated on previous imaging in 2013. (ref: J Am Coll Radiol. 2015 Feb;12(2): 143-50). No other mass or adenopathy. Upper chest: Diffuse interlobular septal thickening seen within the visualized lungs, suggesting pulmonary interstitial edema. Visualized upper chest demonstrates no other acute finding. Review of the MIP images confirms the above findings CTA HEAD  FINDINGS Anterior circulation: Petrous segments patent bilaterally. Extensive atheromatous change throughout the carotid siphons with associated moderate to advanced diffuse narrowing. A1 segments patent bilaterally. Normal anterior communicating artery complex. Anterior cerebral arteries patent to their distal aspects without stenosis. No M1 stenosis or occlusion. Normal MCA bifurcations. Distal MCA branches well perfused and symmetric. Posterior circulation: Atheromatous change within the mid V4 segments bilaterally with associated moderate stenoses. Left PICA origin patent and normal. Right PICA not seen. Basilar patent to its distal aspect without stenosis. Superior cerebellar arteries patent bilaterally. Left PCA supplied via the basilar. Predominant fetal  type origin of the right PCA. PCAs patent to their distal aspects without stenosis. Venous sinuses: Patent allowing for timing the contrast bolus. Anatomic variants: Predominant fetal type origin of the right PCA. No intracranial aneurysm. No vascular abnormality seen underlying the hemorrhages at the pons and cerebellum. Review of the MIP images confirms the above findings IMPRESSION: 1. Negative CTA for large vessel occlusion. No vascular abnormality seen underlying the hemorrhages at the pons and cerebellum. 2. Extensive atheromatous change throughout the carotid siphons with associated moderate to advanced diffuse narrowing. 3. Atheromatous change about the V4 segments bilaterally with associated moderate stenoses. 4. Diffuse interlobular septal thickening within the visualized lungs, suggesting pulmonary interstitial edema. Electronically Signed   By: Jeannine Boga M.D.   On: 09/24/2020 21:57   MR BRAIN WO CONTRAST  Result Date: 09/25/2020 CLINICAL DATA:  Previous stroke. Chronic atrial fibrillation. Right hemiparesis. New onset headache. Intracranial hemorrhage. EXAM: MRI HEAD WITHOUT CONTRAST TECHNIQUE: Multiplanar, multiecho pulse sequences  of the brain and surrounding structures were obtained without intravenous contrast. COMPARISON:  CT studies yesterday. FINDINGS: Brain: Acute intraparenchymal hemorrhage within the right side of the pons is redemonstrated without appreciable change. Acute hemorrhage in the inferior cerebellar vermis is redemonstrated without appreciable change. Chronic small-vessel ischemic changes are present elsewhere throughout the pons. Few other old small vessel cerebellar infarctions. Cerebral hemispheres show chronic small-vessel disease throughout the thalami and hemispheric white matter. Few old basal ganglia infarctions. No large vessel territory infarction. Numerous scattered punctate foci of hemosiderin deposition related to some of the old small vessel infarctions. Small amount of blood dependent within the occipital horns of the lateral ventricles and in the perimesencephalic cistern. No hydrocephalus. No sign of mass lesion. Vascular: Major vessels at the base of the brain show flow. Skull and upper cervical spine: Negative Sinuses/Orbits: Clear/normal Other: None IMPRESSION: Redemonstration of acute intraparenchymal hemorrhage within the right side of the pons and within the inferior cerebellar vermis. Small amount of subarachnoid blood in the perimesencephalic cistern, interfoliar subarachnoid spaces of the posterior fossa and dependent within the occipital horns of the lateral ventricles. No evidence of increasing or new hemorrhage. No hydrocephalus. Extensive chronic small-vessel ischemic changes elsewhere throughout the brain, many with punctate hemosiderin deposition. Overall findings most consistent with hypertensive disease. Electronically Signed   By: Nelson Chimes M.D.   On: 09/25/2020 07:33   DG Chest Port 1 View  Result Date: 09/24/2020 CLINICAL DATA:  Shortness of breath cough EXAM: PORTABLE CHEST 1 VIEW COMPARISON:  02/10/2020 FINDINGS: Cardiac shadow remains enlarged. Mild vascular congestion is  noted although improved when compared with the prior exam. No sizable effusion is noted. No pneumothorax is seen. No bony abnormality is noted. IMPRESSION: Mild CHF although improved from the prior study. Electronically Signed   By: Inez Catalina M.D.   On: 09/24/2020 18:49   ECHOCARDIOGRAM COMPLETE  Result Date: 09/25/2020    ECHOCARDIOGRAM REPORT   Patient Name:   SHINIQUE FOHL Date of Exam: 09/25/2020 Medical Rec #:  [id number]    Height:       68.0 in Accession #:    [id number]   Weight:       200.0 lb Date of Birth:  22-Apr-1935     BSA:          2.044 m Patient Age:    54 years     BP:           118/43 mmHg Patient Gender: F  HR:           76 bpm. Exam Location:  Inpatient Procedure: 2D Echo Indications:    stroke  History:        Patient has prior history of Echocardiogram examinations, most                 recent 06/29/2019. CHF, Arrythmias:Atrial Fibrillation; Risk                 Factors:Diabetes and Hypertension.  Sonographer:    Johny Chess Referring Phys: [phone] [place]  Sonographer Comments: Image acquisition challenging due to patient body habitus. IMPRESSIONS  1. Left ventricular ejection fraction, by estimation, is 55 to 60%. The left ventricle has normal function. The left ventricle has no regional wall motion abnormalities. Left ventricular diastolic parameters are consistent with Grade II diastolic dysfunction (pseudonormalization). Elevated left atrial pressure.  2. Right ventricular systolic function is normal. The right ventricular size is normal.  3. The mitral valve is normal in structure. Trivial mitral valve regurgitation. No evidence of mitral stenosis.  4. The aortic valve has an indeterminant number of cusps. Aortic valve regurgitation is not visualized. No aortic stenosis is present.  5. The inferior vena cava is normal in size with greater than 50% respiratory variability, suggesting right atrial pressure of 3 mmHg. FINDINGS  Left Ventricle: Left ventricular  ejection fraction, by estimation, is 55 to 60%. The left ventricle has normal function. The left ventricle has no regional wall motion abnormalities. The left ventricular internal cavity size was normal in size. There is  no left ventricular hypertrophy. Left ventricular diastolic parameters are consistent with Grade II diastolic dysfunction (pseudonormalization). Elevated left atrial pressure. Right Ventricle: The right ventricular size is normal.Right ventricular systolic function is normal. Left Atrium: Left atrial size was normal in size. Right Atrium: Right atrial size was normal in size. Pericardium: There is no evidence of pericardial effusion. Mitral Valve: The mitral valve is normal in structure. Mild mitral annular calcification. Trivial mitral valve regurgitation. No evidence of mitral valve stenosis. Tricuspid Valve: The tricuspid valve is normal in structure. Tricuspid valve regurgitation is trivial. No evidence of tricuspid stenosis. Aortic Valve: The aortic valve has an indeterminant number of cusps. Aortic valve regurgitation is not visualized. No aortic stenosis is present. Pulmonic Valve: The pulmonic valve was not well visualized. Pulmonic valve regurgitation is not visualized. No evidence of pulmonic stenosis. Aorta: The aortic root is normal in size and structure. Venous: The inferior vena cava is normal in size with greater than 50% respiratory variability, suggesting right atrial pressure of 3 mmHg. IAS/Shunts: No atrial level shunt detected by color flow Doppler.  LEFT VENTRICLE PLAX 2D LVIDd:         4.70 cm  Diastology LVIDs:         3.40 cm  LV e' medial:    4.57 cm/s LV PW:         0.90 cm  LV E/e' medial:  33.3 LV IVS:        1.10 cm  LV e' lateral:   7.18 cm/s LVOT diam:     2.10 cm  LV E/e' lateral: 21.2 LV SV:         73 LV SV Index:   36 LVOT Area:     3.46 cm  RIGHT VENTRICLE             IVC RV S prime:     13.60 cm/s  IVC diam: 2.20 cm TAPSE (  M-mode): 1.5 cm LEFT ATRIUM              Index LA diam:        3.90 cm 1.91 cm/m LA Vol (A2C):   51.9 ml 25.39 ml/m LA Vol (A4C):   63.4 ml 31.02 ml/m LA Biplane Vol: 61.0 ml 29.85 ml/m  AORTIC VALVE LVOT Vmax:   106.00 cm/s LVOT Vmean:  72.100 cm/s LVOT VTI:    0.211 m  AORTA Ao Root diam: 2.90 cm MITRAL VALVE MV Area (PHT): 3.72 cm     SHUNTS MV Decel Time: 204 msec     Systemic VTI:  0.21 m MV E velocity: 152.00 cm/s  Systemic Diam: 2.10 cm MV A velocity: 93.00 cm/s MV E/A ratio:  1.63 Kirk Ruths MD Electronically signed by Kirk Ruths MD Signature Date/Time: 09/25/2020/4:45:44 PM    Final     Labs:  Basic Metabolic Panel: Recent Labs  Lab 10/09/20 1008  NA 133*  K 5.0  CL 106  CO2 20*  GLUCOSE 198*  BUN 36*  CREATININE 1.60*  CALCIUM 8.6*    CBC: Recent Labs  Lab 10/09/20 1008  WBC 10.6*  NEUTROABS 8.7*  HGB 10.5*  HCT 31.6*  MCV 87.5  PLT 260    CBG: Recent Labs  Lab 10/10/20 0624 10/10/20 1132 10/10/20 1635 10/10/20 2109 10/11/20 0615  GLUCAP 163* 234* 200* 212* 175*    Brief HPI:   [name] is a 86 y.o. right-handed female female with history of previous stroke right-sided weakness chronic atrial fibrillation on Eliquis diabetes mellitus diastolic congestive heart failure CKD stage III.  Lives with son 1 level home.  Presented 09/24/2020 with acute onset of headache slurred speech.  Denied double vision.  While in the ED noted shortness of breath pulmonary edema noted on chest x-ray BiPAP initiated.  Cranial CT scan suspicious for hemorrhage in the posterior fossa.  CT angiogram head and neck negative for large vessel occlusion.  No vascular abnormalities.  MRI of the brain redemonstrated acute intraparenchymal hemorrhage within the right side of the pons and inferior cerebellar vermis.  Small amount of subarachnoid blood in the perimesencephalic cistern.  No evidence increasing hemorrhage.  No hydrocephalus.  Admission chemistries glucose 217 creatinine 1.58.  Echocardiogram with ejection  fraction of 55 to 123456 grade 2 diastolic dysfunction.  Maintained on Cleviprex for blood pressure control.  She did receive Andexxa to reverse her Eliquis.  Maintained on a regular consistency diet.  Due to patient decreased functional ability and slurred speech she was admitted for a comprehensive rehab program.   Hospital Course: [name] was admitted to rehab 09/30/2020 for inpatient therapies to consist of PT, ST and OT at least three hours five days a week. Past admission physiatrist, therapy team and rehab RN have worked together to provide customized collaborative inpatient rehab.  Pertaining to patient's intraparenchymal hemorrhage involving right pons cerebellar vermis.  Her Eliquis remained on hold due to intraparenchymal hemorrhage.  She did have history of atrial fibrillation cardiac rate controlled.  Rehabilitation course her diet was a dysphagia #2 thin liquid followed by speech therapy.  Blood pressure with increased variables on Norvasc Imdur hydralazine adjusted as well as clonidine.  She did have a history of diastolic congestive heart failure again monitor closely for fluid overload with history of CKD stage III.  On the morning of 10/11/2020 increasing shortness of breath she had reported bouts of constipation had received a suppository that morning.  Oxygen saturations  did drop requiring facemask.  Blood pressure is elevated monitored she received 40 of intravenous Lasix repeated with 20 mg pulmonary service critical care consulted receiving another 60 mg intravenous after chest x-ray showed suspect fluid overload.  Chemistries were pending as well as troponins.  Due to patient's pulmonary compromise related to suspect CHF overload she was discharged to acute care services.   Blood pressures were monitored on TID basis and elevated and monitored  Diabetes has been monitored with ac/hs CBG checks and SSI was use prn for tighter BS control.    Rehab course: During patient's stay in  rehab weekly team conferences were held to monitor patient's progress, set goals and discuss barriers to discharge. At admission, patient required moderate assist  Physical exam.  Blood pressure 200/92 pulse 80 respirations 30 oxygen saturation 90% facemask Constitutional.  Complaints of buttock pain HEENT Head.  Normocephalic and atraumatic Eyes.  Pupils round and reactive to light no discharge.nystagmus Neck.  Supple nontender no JVD without thyromegaly Cardiac.  Rate controlled Abdomen.  Soft nontender positive bowel sounds Respiratory decreased breath sounds at the bases Neurologic.  Speech dysarthric oriented to person and place right lower extremity 2/5 right upper extremity 5/5 left lower extremity 4/5 left upper extremity 5/5  He/She  has had improvement in activity tolerance, balance, postural control as well as ability to compensate for deficits. He/She has had improvement in functional use RUE/LUE  and RLE/LLE as well as improvement in awareness.  Max assist for rolling in the bed and max assist to lift trunk.  She is able to wash her face with set up.  Max assist to return to supine total assist +2 to boost up in the bed.       Disposition: Discharged to acute care services    Diet: Currently dysphagia #2 thin liquids  Special Instructions: As per critical care  Discharge Instructions    Ambulatory referral to Neurology   Complete by: As directed    An appointment is requested in approximately 4 weeks intraparenchymal hemorrhage       Follow-up Information    Kirsteins, Luanna Salk, MD Follow up.   Specialty: Physical Medicine and Rehabilitation Why: Office to call for appointment Contact information: [address] [place] [address] [phone]               Signed: Cathlyn Parsons 10/11/2020, 7:41 AM

## 2020-10-12 ENCOUNTER — Other Ambulatory Visit: Payer: Self-pay

## 2020-10-12 ENCOUNTER — Inpatient Hospital Stay (HOSPITAL_COMMUNITY): Payer: Medicare HMO

## 2020-10-12 DIAGNOSIS — I5031 Acute diastolic (congestive) heart failure: Secondary | ICD-10-CM

## 2020-10-12 LAB — CBC
HCT: 32.5 % — ABNORMAL LOW (ref 36.0–46.0)
Hemoglobin: 10.5 g/dL — ABNORMAL LOW (ref 12.0–15.0)
MCH: 28.4 pg (ref 26.0–34.0)
MCHC: 32.3 g/dL (ref 30.0–36.0)
MCV: 87.8 fL (ref 80.0–100.0)
Platelets: 288 10*3/uL (ref 150–400)
RBC: 3.7 MIL/uL — ABNORMAL LOW (ref 3.87–5.11)
RDW: 15.2 % (ref 11.5–15.5)
WBC: 12.4 10*3/uL — ABNORMAL HIGH (ref 4.0–10.5)
nRBC: 0 % (ref 0.0–0.2)

## 2020-10-12 LAB — BASIC METABOLIC PANEL
Anion gap: 8 (ref 5–15)
BUN: 37 mg/dL — ABNORMAL HIGH (ref 8–23)
CO2: 21 mmol/L — ABNORMAL LOW (ref 22–32)
Calcium: 8.4 mg/dL — ABNORMAL LOW (ref 8.9–10.3)
Chloride: 106 mmol/L (ref 98–111)
Creatinine, Ser: 1.82 mg/dL — ABNORMAL HIGH (ref 0.44–1.00)
GFR, Estimated: 27 mL/min — ABNORMAL LOW (ref >60)
Glucose, Bld: 161 mg/dL — ABNORMAL HIGH (ref 70–99)
Potassium: 4.2 mmol/L (ref 3.5–5.1)
Sodium: 135 mmol/L (ref 135–145)

## 2020-10-12 LAB — GLUCOSE, CAPILLARY
Glucose-Capillary: 132 mg/dL — ABNORMAL HIGH (ref 70–99)
Glucose-Capillary: 141 mg/dL — ABNORMAL HIGH (ref 70–99)
Glucose-Capillary: 152 mg/dL — ABNORMAL HIGH (ref 70–99)
Glucose-Capillary: 245 mg/dL — ABNORMAL HIGH (ref 70–99)
Glucose-Capillary: 249 mg/dL — ABNORMAL HIGH (ref 70–99)
Glucose-Capillary: 316 mg/dL — ABNORMAL HIGH (ref 70–99)
Glucose-Capillary: 91 mg/dL (ref 70–99)

## 2020-10-12 LAB — MAGNESIUM: Magnesium: 2.2 mg/dL (ref 1.7–2.4)

## 2020-10-12 LAB — PHOSPHORUS: Phosphorus: 4.6 mg/dL (ref 2.5–4.6)

## 2020-10-12 MED ORDER — ONDANSETRON HCL 4 MG/2ML IJ SOLN
4.0000 mg | Freq: Four times a day (QID) | INTRAMUSCULAR | Status: DC | PRN
  Administered 2020-10-14 (×3): 4 mg via INTRAVENOUS
  Filled 2020-10-12 (×3): qty 2

## 2020-10-12 MED ORDER — ONDANSETRON HCL 4 MG/2ML IJ SOLN
INTRAMUSCULAR | Status: AC
  Administered 2020-10-12: 4 mg via INTRAVENOUS
  Filled 2020-10-12: qty 2

## 2020-10-12 NOTE — Progress Notes (Addendum)
NAME:  [name], MRN:  [id number], DOB:  1934-11-07, LOS: 1 ADMISSION DATE:  10/11/2020, CONSULTATION DATE: 10/11/2020 REFERRING MD:  Donia Guiles, PA, CHIEF COMPLAINT:  resp distress   History of Present Illness:  This is an 86 year old female, history of previous stroke right-sided chronic atrial fibrillation on Eliquis, diabetes, chronic diastolic heart failure, CKD stage III.  Patient initially presented to the hospital on May 10 with acute onset headache, slurred speech.  Patient had MRI which redemonstrated acute intraparenchymal hemorrhage on the right side of the pons and inferior cerebellar vermis.  She also had a small subarachnoid bleed.  Patient had blood pressure control with Cleviprex, she was on Eliquis and did receive andexanet alfa for reversal.  Ultimately she had a decreased functional ability and decision was made for admission to the rehab facility.  Patient's hospital course during inpatient rehab is included PT, SLP and OT.  Rate controlled A. fib.  Eliquis remains on hold.  Blood pressures been treated with Norvasc, Imdur and hydralazine plus clonidine.  Lasix for her diastolic heart failure and CKD 3.  On the morning of 10/11/2020 patient was found to be in respiratory distress and hypoxemic. Pulmonary critical care was consulted for evaluation.  Patient had a chest x-ray that was completed this morning that revealed bilateral airspace disease concerning for pneumonia versus pulmonary edema.  Decision was made for transfer to the intensive care unit after being placed on BiPAP for support  Pertinent  Medical History   Past Medical History:  Diagnosis Date  . Arthritis   . CHF (congestive heart failure) ([place])   . Chronic diastolic heart failure ([place])   . CKD (chronic kidney disease) stage 3, GFR 30-59 ml/min (HCC)   . Essential hypertension   . GERD (gastroesophageal reflux disease)   . Hemorrhoids   . History of stroke   . Type 2 diabetes mellitus ([place])       Significant Hospital Events: Including procedures, antibiotic start and stop dates in addition to other pertinent events   . 10/11/2020: BiPAP transferred to ICU  Interim History / Subjective:   Remains on BiPAP.  Failed BiPAP coming off overnight.  Abdominal breathing again was placed back on BiPAP.  Has had good urine output.  -3.2 L out with twice daily Lasix dosing.  Objective   Blood pressure (!) 161/59, pulse 68, temperature 97.9 F (36.6 C), temperature source Oral, resp. rate (!) 26, weight 103.5 kg, SpO2 99 %.    Vent Mode: BIPAP;PCV FiO2 (%):  [30 %-40 %] 30 % Set Rate:  [15 bmp] 15 bmp PEEP:  [8 cmH20] 8 cmH20   Intake/Output Summary (Last 24 hours) at 10/12/2020 [id number] Last data filed at 10/12/2020 0600 Gross per 24 hour  Intake 106.33 ml  Output 3405 ml  Net -3298.67 ml   Filed Weights   10/11/20 0900 10/12/20 0404  Weight: 106.7 kg 103.5 kg    Examination: General: Obese chronically ill-appearing female on BiPAP HENT: Mask on, tracking appropriately Lungs: Rhonchi present, crackles bilaterally Cardiovascular: Regular rate rhythm, S1-S2 Abdomen: Obese, soft nontender Extremities: Bilateral lower extremity edema Neuro: Alert, following commands, hemiparetic, weakness GU: Pure wick in place  Labs/imaging that I havepersonally reviewed  (right click and "Reselect all SmartList Selections" daily)   Labs reviewed Sodium 135 BUN 37 Creatinine 1.82 White blood cell count 12.4 Hemoglobin 10.5  Resolved Hospital Problem list     Assessment & Plan:   Acute hypoxemic respiratory failure requiring BiPAP Acute bilateral pulmonary edema,  chest x-ray with bilateral infiltrates No obvious sign of infection but hospital-acquired pneumonia remains on differential. Acute diastolic heart failure CKD 3 with volume overload Hypertension Plan: Continue BiPAP management Continue Lasix 80 mg twice daily Continue hydralazine, Imdur, Norvasc Follow kidney function  with diuresis and electrolytes Goal potassium greater than 4, goal magnesium greater than 2  Type 2 diabetes, hyperglycemia Plan: Levemir 22 units twice daily Continue CBGs with SSI Advance diet as tolerated  Right pons intraparenchymal hemorrhage, history of CVA with residual right-sided weakness Plan: Supportive care Hopefully back to rehab or SNF in future  Gastroesophageal reflux P:  Continue Protonix    Best practice (right click and "Reselect all SmartList Selections" daily)  Diet:  NPO Pain/Anxiety/Delirium protocol (if indicated): No VAP protocol (if indicated): Not indicated DVT prophylaxis: LMWH GI prophylaxis: N/A and PPI Glucose control:  SSI Yes Central venous access:  N/A Arterial line:  N/A Foley:  Yes, and it is still needed Mobility:  bed rest  PT consulted: Yes Last date of multidisciplinary goals of care discussion [n/a] Code Status:  full code Disposition: ICU   Labs   CBC: Recent Labs  Lab 10/09/20 1008 10/11/20 0812 10/12/20 0051  WBC 10.6* 19.0* 12.4*  NEUTROABS 8.7*  --   --   HGB 10.5* 11.3* 10.5*  HCT 31.6* 34.8* 32.5*  MCV 87.5 88.8 87.8  PLT 260 287 [id number]    Basic Metabolic Panel: Recent Labs  Lab 10/09/20 1008 10/11/20 0812 10/12/20 0051  NA 133* 132* 135  K 5.0 4.3 4.2  CL 106 102 106  CO2 20* 18* 21*  GLUCOSE 198* 264* 161*  BUN 36* 35* 37*  CREATININE 1.60* 1.86* 1.82*  CALCIUM 8.6* 8.4* 8.4*  MG  --   --  2.2  PHOS  --   --  4.6   GFR: Estimated Creatinine Clearance: 27 mL/min (A) (by C-G formula based on SCr of 1.82 mg/dL (H)). Recent Labs  Lab 10/09/20 1008 10/11/20 0812 10/12/20 0051  WBC 10.6* 19.0* 12.4*    Liver Function Tests: Recent Labs  Lab 10/09/20 1008 10/11/20 0812  AST 17 19  ALT 21 23  ALKPHOS 47 59  BILITOT 0.6 0.7  PROT 6.8 7.1  ALBUMIN 3.0* 3.1*   No results for input(s): LIPASE, AMYLASE in the last 168 hours. No results for input(s): AMMONIA in the last 168 hours.  ABG     Component Value Date/Time   PHART 7.310 (L) 07/25/2019 0620   PCO2ART 51.6 (H) 07/25/2019 0620   PO2ART 87.1 07/25/2019 0620   HCO3 23.7 07/25/2019 0620   TCO2 15 (L) 09/24/2020 2020   ACIDBASEDEF 0.3 07/25/2019 0620   O2SAT 95.8 07/25/2019 0620     Coagulation Profile: No results for input(s): INR, PROTIME in the last 168 hours.  Cardiac Enzymes: No results for input(s): CKTOTAL, CKMB, CKMBINDEX, TROPONINI in the last 168 hours.  HbA1C: Hgb A1c MFr Bld  Date/Time Value Ref Range Status  09/26/2020 03:23 AM 9.9 (H) 4.8 - 5.6 % Final    Comment:    (NOTE) Pre diabetes:          5.7%-6.4%  Diabetes:              >6.4%  Glycemic control for   <7.0% adults with diabetes   02/08/2020 10:03 AM 8.9 (H) 4.8 - 5.6 % Final    Comment:    (NOTE) Pre diabetes:          5.7%-6.4%  Diabetes:              >  6.4%  Glycemic control for   <7.0% adults with diabetes     CBG: Recent Labs  Lab 10/11/20 1120 10/11/20 1515 10/11/20 2005 10/12/20 0007 10/12/20 0400  GLUCAP 249* 292* 201* 152* 132*    Review of Systems:   Unable to be obtained secondary to critical illness  Past Medical History:  She,  has a past medical history of Arthritis, CHF (congestive heart failure) (HCC), Chronic diastolic heart failure ([place]), CKD (chronic kidney disease) stage 3, GFR 30-59 ml/min (HCC), Essential hypertension, GERD (gastroesophageal reflux disease), Hemorrhoids, History of stroke, and Type 2 diabetes mellitus ([place]).   Surgical History:   Past Surgical History:  Procedure Laterality Date  . BACK SURGERY    . CATARACT EXTRACTION W/PHACO Left 02/28/2013   Procedure: CATARACT EXTRACTION PHACO AND INTRAOCULAR LENS PLACEMENT (IOL) CDE=15.60;  Surgeon: Elta Guadeloupe T. Gershon Crane, MD;  Location: [facility];  Service: Ophthalmology;  Laterality: Left;  . CHOLECYSTECTOMY    . COLONOSCOPY  12/12/2003   RMR: Normal rectum.  Long capacious tortuous colon, colonic mucosa appeared normal  . COLONOSCOPY N/A  03/21/2014   Dr. [name]: internal and external hemorrhoids, torturous colon, colonic diverticulosis   . ESOPHAGOGASTRODUODENOSCOPY  12/28/2001   [id number] sliding hiatal hernia/. Three small bulbar ulcers, two with stigmata of bleeding and these were coagulated using heater probe unit   . ESOPHAGOGASTRODUODENOSCOPY N/A 03/21/2014   Dr. [name]: cervical esophageal web s/p dilation, negative H.pylori   . EYE SURGERY    . HIP FRACTURE SURGERY  2008  . JOINT REPLACEMENT     Rt hip, ????? sounds like just for fracture  . MALONEY DILATION N/A 03/21/2014   Procedure: [name] DILATION;  Surgeon: [name], MD;  Location: [facility];  Service: Endoscopy;  Laterality: N/A;  . SAVORY DILATION N/A 03/21/2014   Procedure: SAVORY DILATION;  Surgeon: [name], MD;  Location: [facility];  Service: Endoscopy;  Laterality: N/A;     Social History:   reports that she has never smoked. She has never used smokeless tobacco. She reports that she does not drink alcohol and does not use drugs.   Family History:  Her family history includes Crohn's disease in her daughter; Diabetes in her brother, sister, and sister; Heart attack in her sister; Hypertension in her mother and sister. There is no history of Colon cancer.   Allergies No Known Allergies   Home Medications  Prior to Admission medications   Medication Sig Start Date End Date Taking? Authorizing Provider  acetaminophen (TYLENOL) 325 MG tablet Take 2 tablets (650 mg total) by mouth every 6 (six) hours as needed for mild pain, fever or headache (or Fever >/= 101). 07/31/19   Roxan Hockey, MD  amLODipine (NORVASC) 10 MG tablet Take 1 tablet (10 mg total) by mouth daily. 09/29/20   Patrecia Pour, MD  cloNIDine (CATAPRES) 0.1 MG tablet Take 1 tablet (0.1 mg total) by mouth 3 (three) times daily. 10/11/20   Angiulli, Lavon Paganini, PA-C  diclofenac Sodium (VOLTAREN) 1 % GEL Apply 2 g topically 4 (four) times daily. 10/11/20   Angiulli, Lavon Paganini, PA-C   DULoxetine (CYMBALTA) 30 MG capsule Take 1 capsule (30 mg total) by mouth daily. 10/11/20   Angiulli, Lavon Paganini, PA-C  hydrALAZINE (APRESOLINE) 100 MG tablet Take 1 tablet (100 mg total) by mouth 3 (three) times daily. 09/30/20   Patrecia Pour, MD  hydrocortisone (ANUSOL-HC) 2.5 % rectal cream Place 1 application rectally 2 (two) times daily as needed for  hemorrhoids or anal itching. 01/03/20   Barton Dubois, MD  insulin aspart (NOVOLOG) 100 UNIT/ML FlexPen Inject 0-10 Units into the skin 3 (three) times daily with meals. insulin aspart (novoLOG) injection 0-10 Units 0-10 Units Subcutaneous, 3 times daily with meals CBG < 70: Implement Hypoglycemia Standing Orders and refer to Hypoglycemia Standing Orders sidebar report  CBG 70 - 120: 0 unit CBG 121 - 150: 0 unit  CBG 151 - 200: 1 unit CBG 201 - 250: 2 units CBG 251 - 300: 4 units CBG 301 - 350: 6 units  CBG 351 - 400: 8 units  CBG > 400: 10 units 07/31/19   Emokpae, Courage, MD  insulin aspart (NOVOLOG) 100 UNIT/ML injection Inject 5 Units into the skin 3 (three) times daily with meals. 10/11/20   Angiulli, Lavon Paganini, PA-C  insulin detemir (LEVEMIR) 100 UNIT/ML injection Inject 0.44 mLs (44 Units total) into the skin 2 (two) times daily. 10/11/20   Angiulli, Lavon Paganini, PA-C  isosorbide mononitrate (IMDUR) 60 MG 24 hr tablet Take 1 tablet (60 mg total) by mouth daily. 10/11/20 10/11/21  Angiulli, Lavon Paganini, PA-C  ondansetron (ZOFRAN) 4 MG tablet Take 4 mg by mouth every 6 (six) hours as needed for nausea or vomiting.    [provider]  pantoprazole (PROTONIX) 40 MG tablet Take 1 tablet by mouth daily. 09/06/20   [provider]  traMADol (ULTRAM) 50 MG tablet Take 1 tablet (50 mg total) by mouth every 12 (twelve) hours as needed for severe pain. 10/11/20   Angiulli, Lavon Paganini, PA-C     This patient is critically ill with multiple organ system failure; which, requires frequent high complexity decision making, assessment, support, evaluation, and  titration of therapies. This was completed through the application of advanced monitoring technologies and extensive interpretation of multiple databases. During this encounter critical care time was devoted to patient care services described in this note for 32 minutes.  Garner Nash, DO [facility] 10/12/2020 8:34 AM

## 2020-10-12 NOTE — Plan of Care (Signed)

## 2020-10-12 NOTE — Progress Notes (Signed)
Patient resting comfortably on 2L [place] with no respiratory distress noted. BIPAP not needed at this time. BIPAP is at bedside on standby. RT will monitor as needed.

## 2020-10-12 NOTE — Evaluation (Signed)
Clinical/Bedside Swallow Evaluation Patient Details  Name: [name] MRN: [id number] Date of Birth: 17-Jan-1935  Today's Date: 10/12/2020 Time: SLP Start Time (ACUTE ONLY): [id number] SLP Stop Time (ACUTE ONLY): 0326 SLP Time Calculation (min) (ACUTE ONLY): 9 min  Past Medical History:  Past Medical History:  Diagnosis Date  . Arthritis   . CHF (congestive heart failure) ([place])   . Chronic diastolic heart failure ([place])   . CKD (chronic kidney disease) stage 3, GFR 30-59 ml/min (HCC)   . Essential hypertension   . GERD (gastroesophageal reflux disease)   . Hemorrhoids   . History of stroke   . Type 2 diabetes mellitus ([place])    Past Surgical History:  Past Surgical History:  Procedure Laterality Date  . BACK SURGERY    . CATARACT EXTRACTION W/PHACO Left 02/28/2013   Procedure: CATARACT EXTRACTION PHACO AND INTRAOCULAR LENS PLACEMENT (IOL) CDE=15.60;  Surgeon: Elta Guadeloupe T. Gershon Crane, MD;  Location: [facility];  Service: Ophthalmology;  Laterality: Left;  . CHOLECYSTECTOMY    . COLONOSCOPY  12/12/2003   RMR: Normal rectum.  Long capacious tortuous colon, colonic mucosa appeared normal  . COLONOSCOPY N/A 03/21/2014   Dr. [name]: internal and external hemorrhoids, torturous colon, colonic diverticulosis   . ESOPHAGOGASTRODUODENOSCOPY  12/28/2001   [id number] sliding hiatal hernia/. Three small bulbar ulcers, two with stigmata of bleeding and these were coagulated using heater probe unit   . ESOPHAGOGASTRODUODENOSCOPY N/A 03/21/2014   Dr. [name]: cervical esophageal web s/p dilation, negative H.pylori   . EYE SURGERY    . HIP FRACTURE SURGERY  2008  . JOINT REPLACEMENT     Rt hip, ????? sounds like just for fracture  . MALONEY DILATION N/A 03/21/2014   Procedure: [name] DILATION;  Surgeon: [name], MD;  [facility];  Service: Endoscopy;  Laterality: N/A;  . SAVORY DILATION N/A 03/21/2014   Procedure: SAVORY DILATION;  Surgeon: [name], MD;  Location[facility];  Service:  Endoscopy;  Laterality: N/A;   HPI:  Pt is an 86 yo woman who initially presented to the hospital on May 10 with acute onset headache, slurred speech.  Patient had MRI which redemonstrated acute intraparenchymal hemorrhage on the right side of the pons and inferior cerebellar vermis.  She also had a small subarachnoid bleed. Pt transferred to CIR when stable.  Pt seen by ST in CIR with pt on Dys2/Thins and having trials of mechanical soft. On the morning of 10/11/2020 patient was found to be in respiratory distress and hypoxemic.  Pulmonary critical care was consulted for evaluation.  Patient had a chest x-ray that was completed this morning that revealed bilateral airspace disease concerning for pneumonia versus pulmonary edema.  Decision was made for transfer to the intensive care unit after being placed on BiPAP for support.  CXR 5/28: "Pulmonary vascular congestion. No frank interstitial edema,  improved."   Assessment / Plan / Recommendation Clinical Impression  Pt presents with clinical indicators of pharyngeal dysphagia.  Pt has been consuming chopped/ground diet with thin liquids at CIR.  She returend to ICU 5/27 following episode of respiratory distress.  Pt was placed on BiPAP at the time, but has been on [place] all day.  With thin liquid, nectar thick liquid, and puree there was frequent soft, delayed throat clear.  Pt required two swallows with thin liquid.  Pt tolerated soft solids without any clinical s/s of aspiration, and exhibited good oral clearance.    Recommend mechanical soft diet with thin  liquids.  Recommend instrumental assessment of swallowing for further assessment of pharyngeal swallow function, which is planned for next date.  SLP Visit Diagnosis: Dysphagia, unspecified (R13.10)    Aspiration Risk  Mild aspiration risk    Diet Recommendation Dysphagia 3 (Mech soft);Thin liquid   Liquid Administration via: Cup;Straw Medication Administration: Whole meds with  liquid Supervision: Staff to assist with self feeding Compensations: Slow rate;Small sips/bites Postural Changes: Seated upright at 90 degrees    Other  Recommendations Oral Care Recommendations: Oral care BID   Follow up Recommendations        Frequency and Duration  (TBD)          Prognosis Prognosis for Safe Diet Advancement: Good      Swallow Study   General Date of Onset: 10/11/20 HPI: Pt is an 86 yo woman who initially presented to the hospital on May 10 with acute onset headache, slurred speech.  Patient had MRI which redemonstrated acute intraparenchymal hemorrhage on the right side of the pons and inferior cerebellar vermis.  She also had a small subarachnoid bleed. Pt transferred to CIR when stable.  Pt seen by ST in CIR with pt on Dys2/Thins and having trials of mechanical soft. On the morning of 10/11/2020 patient was found to be in respiratory distress and hypoxemic.  Pulmonary critical care was consulted for evaluation.  Patient had a chest x-ray that was completed this morning that revealed bilateral airspace disease concerning for pneumonia versus pulmonary edema.  Decision was made for transfer to the intensive care unit after being placed on BiPAP for support.  CXR 5/28: "Pulmonary vascular congestion. No frank interstitial edema,  improved." Type of Study: Bedside Swallow Evaluation Previous Swallow Assessment: During initial admission and at CIR.  No instrumental study Diet Prior to this Study: Thin liquids Temperature Spikes Noted: No Respiratory Status: Nasal cannula History of Recent Intubation: No Behavior/Cognition: Alert;Cooperative;Pleasant mood Oral Cavity Assessment: Within Functional Limits Oral Care Completed by SLP: No Oral Cavity - Dentition: Dentures, top;Missing dentition Vision: Functional for self-feeding Self-Feeding Abilities: Needs assist Patient Positioning: Upright in bed Baseline Vocal Quality: Normal Volitional Cough: Strong Volitional  Swallow: Able to elicit    Oral/Motor/Sensory Function Overall Oral Motor/Sensory Function: Mild impairment Facial ROM: Within Functional Limits Facial Symmetry: Abnormal symmetry right Lingual ROM: Reduced left Lingual Symmetry: Within Functional Limits Lingual Strength: Within Functional Limits Velum: Within Functional Limits Mandible: Within Functional Limits   Ice Chips Ice chips: Not tested   Thin Liquid Thin Liquid: Within functional limits Pharyngeal  Phase Impairments: Throat Clearing - Delayed;Multiple swallows    Nectar Thick Nectar Thick Liquid: Impaired Pharyngeal Phase Impairments: Multiple swallows;Throat Clearing - Delayed   Honey Thick Honey Thick Liquid: Not tested   Puree Puree: Impaired Presentation: Spoon Pharyngeal Phase Impairments: Throat Clearing - Delayed   Solid     Solid: Within functional limits Presentation: [place], [place], [place] Office: [phone]; Pager (5/28): [phone] 10/12/2020,3:57 PM

## 2020-10-13 ENCOUNTER — Inpatient Hospital Stay (HOSPITAL_COMMUNITY): Payer: Medicare HMO

## 2020-10-13 LAB — GLUCOSE, CAPILLARY
Glucose-Capillary: 199 mg/dL — ABNORMAL HIGH (ref 70–99)
Glucose-Capillary: 201 mg/dL — ABNORMAL HIGH (ref 70–99)
Glucose-Capillary: 243 mg/dL — ABNORMAL HIGH (ref 70–99)
Glucose-Capillary: 251 mg/dL — ABNORMAL HIGH (ref 70–99)
Glucose-Capillary: 276 mg/dL — ABNORMAL HIGH (ref 70–99)
Glucose-Capillary: 225 mg/dL — ABNORMAL HIGH (ref 70–99)

## 2020-10-13 MED ORDER — HYDROCORTISONE ACE-PRAMOXINE 1-1 % EX CREA
TOPICAL_CREAM | Freq: Four times a day (QID) | CUTANEOUS | Status: DC
  Filled 2020-10-13 (×2): qty 30

## 2020-10-13 MED ORDER — PHENYLEPHRINE-MINERAL OIL-PET 0.25-14-74.9 % RE OINT
1.0000 "application " | TOPICAL_OINTMENT | Freq: Two times a day (BID) | RECTAL | Status: DC | PRN
  Filled 2020-10-13: qty 57

## 2020-10-13 MED ORDER — FUROSEMIDE 10 MG/ML IJ SOLN
60.0000 mg | Freq: Two times a day (BID) | INTRAMUSCULAR | Status: DC
  Administered 2020-10-13 – 2020-10-15 (×4): 60 mg via INTRAVENOUS
  Filled 2020-10-13 (×4): qty 6

## 2020-10-13 MED ORDER — HYDROCORT-PRAMOXINE (PERIANAL) 1-1 % EX FOAM
1.0000 | Freq: Two times a day (BID) | CUTANEOUS | Status: DC
  Administered 2020-10-13 – 2020-10-17 (×6): 1 via RECTAL
  Filled 2020-10-13 (×3): qty 10

## 2020-10-13 NOTE — Evaluation (Signed)
Physical Therapy Evaluation Patient Details Name: [name] MRN: [id number] DOB: [birth year] Today's Date: 10/13/2020   History of Present Illness  Pt is a 86yo female who was re-admitted to [facility] on 5/27 for respiratory distress and hypoxemia from CIR. PMH: pt with recent CVA on R side of pons and inferior cerebellar vermis, A-fib on eliquis, DM, CHF, CKD stage III    Clinical Impression  Pt pleasant and cooperative. Pt with active participation and was able to follow all commands. Pt able to tolerate sitting EOB with close supervision and tolerated LE exercises without LOB. Pt remains appropriate for CIR upon d/c as pt with significant progress since admission to CIR from last hospital admission. Acute PT to cont to follow.    Follow Up Recommendations CIR    Equipment Recommendations  Wheelchair (measurements PT);Wheelchair cushion (measurements PT)    Recommendations for Other Services Rehab consult     Precautions / Restrictions Precautions Precautions: Fall Restrictions Weight Bearing Restrictions: No      Mobility  Bed Mobility Overal bed mobility: Needs Assistance Bed Mobility: Rolling;Sidelying to Sit;Sit to Supine Rolling: Min assist Sidelying to sit: Min assist;HOB elevated   Sit to supine: Mod assist;+2 for physical assistance   General bed mobility comments: pt able to bring LEs off EOB with increased time, HOB elevated, max directional verbal cues, minA for trunk elevation, modA for LE management back up onto bed    Transfers Overall transfer level: Needs assistance Equipment used:  (2 person lift with bed pad) Transfers: Sit to/from Stand Sit to Stand: Max assist;+2 physical assistance         General transfer comment: attempted to stand x2, able to clear bottom but unable to achieve full standing  Ambulation/Gait                Stairs            Wheelchair Mobility    Modified Rankin (Stroke Patients Only)       Balance Overall  balance assessment: Needs assistance Sitting-balance support: Feet supported;Single extremity supported Sitting balance-Leahy Scale: Good Sitting balance - Comments: worked on dynamic reching, pt able to maintain balance, pt toelrated sitting x 7 min, pt able to wash face wihtout LOB                                     Pertinent Vitals/Pain Pain Assessment: No/denies pain    Home Living Family/patient expects to be discharged to:: Inpatient rehab                 Additional Comments: pt was at [facility] for stroke rehab PTA    Prior Function Level of Independence: Needs assistance   Gait / Transfers Assistance Needed: pt was working on lateral slide board transfers at [place] / [employer] Assistance Needed: working on with OT at [place]: Right    Extremity/Trunk Assessment   Upper Extremity Assessment Upper Extremity Assessment: Defer to OT evaluation    Lower Extremity Assessment Lower Extremity Assessment: Generalized weakness    Cervical / Trunk Assessment Cervical / Trunk Assessment: Normal  Communication   Communication: No difficulties  Cognition Arousal/Alertness: Awake/alert Behavior During Therapy: WFL for tasks assessed/performed Overall Cognitive Status: Within Functional Limits for tasks assessed  General Comments: pt following simple commands consistently. litte to no processing delay      General Comments General comments (skin integrity, edema, etc.): VSS    Exercises Total Joint Exercises Ankle Circles/Pumps: AROM;Both;10 reps;Supine Long Arc Quad: AROM;Both;10 reps;Seated General Exercises - Lower Extremity Gluteal Sets: AROM;Both;10 reps;Seated Hip Flexion/Marching: AROM;Right;10 reps;Seated   Assessment/Plan    PT Assessment Patient needs continued PT services  PT Problem List Decreased strength;Decreased activity tolerance;Decreased  balance;Decreased mobility;Decreased safety awareness;Obesity       PT Treatment Interventions DME instruction;Gait training;Functional mobility training;Therapeutic activities;Therapeutic exercise;Balance training;Patient/family education;Wheelchair mobility training    PT Goals (Current goals can be found in the Care Plan section)  Acute Rehab PT Goals Patient Stated Goal: get back to rehab PT Goal Formulation: With patient/family Time For Goal Achievement: 10/27/20 Potential to Achieve Goals: Good    Frequency Min 4X/week   Barriers to discharge        Co-evaluation               AM-PAC PT "6 Clicks" Mobility  Outcome Measure Help needed turning from your back to your side while in a flat bed without using bedrails?: A Lot Help needed moving from lying on your back to sitting on the side of a flat bed without using bedrails?: A Lot Help needed moving to and from a bed to a chair (including a wheelchair)?: Total Help needed standing up from a chair using your arms (e.g., wheelchair or bedside chair)?: Total Help needed to walk in hospital room?: Total Help needed climbing 3-5 steps with a railing? : Total 6 Click Score: 8    End of Session   Activity Tolerance: Patient tolerated treatment well Patient left: in bed;with call bell/phone within reach;with bed alarm set Nurse Communication: Mobility status PT Visit Diagnosis: Other abnormalities of gait and mobility (R26.89);Muscle weakness (generalized) (M62.81);Difficulty in walking, not elsewhere classified (R26.2)    Time: 1202-1225 PT Time Calculation (min) (ACUTE ONLY): 23 min   Charges:   PT Evaluation $PT Eval Moderate Complexity: 1 Mod          Kittie Plater, PT, DPT Acute Rehabilitation Services Pager #: [phone] Office #: [phone]   Berline Lopes 10/13/2020, 2:36 PM

## 2020-10-13 NOTE — Progress Notes (Signed)
Modified Barium Swallow Progress Note  Patient Details  Name: [name] MRN: [id number] Date of Birth: Oct 23, 1934  Today's Date: 10/13/2020  Modified Barium Swallow completed.  Full report located under Chart Review in the Imaging Section.  Brief recommendations include the following:  Clinical Impression  Pt demonstrates normal oral and oropharyngeal function. Pt is noted to have phonation with exhalation, which can sound like throat clearing at times, though no outright throat clearing occurred during testing. There were one or two instances of flash penetration. Otherwise, primary impariment appears esophageal; esophageal sweep shows significant stasis and pt had backflow of esophageal stasis to oropharynx. Question if this could have contributed to pts possible aspiration event. Pt should continue mech soft diet, follow solids with liquids, eat upright in chair and stay upright after meals. Meals should be eaten at a slow pace. Will f/u for further education on strategies.   Swallow Evaluation Recommendations   Recommended Consults: Consider esophageal assessment   SLP Diet Recommendations: Dysphagia 3 (Mech soft) solids;Thin liquid   Liquid Administration via: Cup;Straw   Medication Administration: Whole meds with puree   Supervision: Patient able to self feed   Compensations: Slow rate;Small sips/bites;Follow solids with liquid   Postural Changes: Seated upright at 90 degrees;Remain semi-upright after after feeds/meals (Comment)   Oral Care Recommendations: Oral care BID       Herbie [place], MA [place] Pager [phone] Office [phone]  Lynann Beaver 10/13/2020,2:14 PM

## 2020-10-13 NOTE — Progress Notes (Signed)
Patient has no c/o being short of breath at this time. Sp02=94% on 2lpm, no bipap needed at this time. Will continue to monitor.

## 2020-10-13 NOTE — Progress Notes (Signed)
NAME:  [name], MRN:  [id number], DOB:  [birth year], LOS: 2 ADMISSION DATE:  10/11/2020, CONSULTATION DATE: 10/11/2020 REFERRING MD:  Donia Guiles, PA, CHIEF COMPLAINT:  resp distress   History of Present Illness:  This is an 86 year old female, history of previous stroke right-sided chronic atrial fibrillation on Eliquis, diabetes, chronic diastolic heart failure, CKD stage III.  Patient initially presented to the hospital on May 10 with acute onset headache, slurred speech.  Patient had MRI which redemonstrated acute intraparenchymal hemorrhage on the right side of the pons and inferior cerebellar vermis.  She also had a small subarachnoid bleed.  Patient had blood pressure control with Cleviprex, she was on Eliquis and did receive andexanet alfa for reversal.  Ultimately she had a decreased functional ability and decision was made for admission to the rehab facility.  Patient's hospital course during inpatient rehab is included PT, SLP and OT.  Rate controlled A. fib.  Eliquis remains on hold.  Blood pressures been treated with Norvasc, Imdur and hydralazine plus clonidine.  Lasix for her diastolic heart failure and CKD 3.  On the morning of 10/11/2020 patient was found to be in respiratory distress and hypoxemic. Pulmonary critical care was consulted for evaluation.  Patient had a chest x-ray that was completed this morning that revealed bilateral airspace disease concerning for pneumonia versus pulmonary edema.  Decision was made for transfer to the intensive care unit after being placed on BiPAP for support  Pertinent  Medical History   Past Medical History:  Diagnosis Date  . Arthritis   . CHF (congestive heart failure) ([place])   . Chronic diastolic heart failure ([place])   . CKD (chronic kidney disease) stage 3, GFR 30-59 ml/min (HCC)   . Essential hypertension   . GERD (gastroesophageal reflux disease)   . Hemorrhoids   . History of stroke   . Type 2 diabetes mellitus ([place])       Significant Hospital Events: Including procedures, antibiotic start and stop dates in addition to other pertinent events   . 10/11/2020: BiPAP transferred to ICU  Interim History / Subjective:   Patient doing well this morning off BiPAP.  Nasal cannula.  Following commands watching television.  No acute distress  Objective   Blood pressure (!) 166/53, pulse (!) 41, temperature 98.1 F (36.7 C), temperature source Oral, resp. rate 16, weight 100.5 kg, SpO2 99 %.    Vent Mode: BIPAP;PCV FiO2 (%):  [30 %] 30 % Set Rate:  [15 bmp] 15 bmp PEEP:  [8 cmH20] 8 cmH20   Intake/Output Summary (Last 24 hours) at 10/13/2020 0700 Last data filed at 10/13/2020 0600 Gross per 24 hour  Intake 1123.9 ml  Output 2835 ml  Net -1711.1 ml   Filed Weights   10/11/20 0900 10/12/20 0404 10/13/20 0600  Weight: 106.7 kg 103.5 kg 100.5 kg    Examination: General: Elderly female resting in bed HENT: Tracking appropriately Lungs: Clear to auscultation bilaterally no crackles no wheeze Cardiovascular: Regular rate rhythm, S1-S2 Abdomen: Obese, soft nontender nondistended Extremities: Trace lower extremity edema, improved Neuro: Alert following commands, weak upper extremities GU: Deferred  Labs/imaging that I havepersonally reviewed  (right click and "Reselect all SmartList Selections" daily)   Labs reviewed Sodium 135 BUN 37 Creatinine 1.82 White blood cell count 12.4 Hemoglobin 10.5  New BMP pending   Resolved Hospital Problem list     Assessment & Plan:   Acute hypoxemic respiratory failure requiring BiPAP Acute bilateral pulmonary edema, chest x-ray with bilateral infiltrates  No obvious sign of infection but hospital-acquired pneumonia remains on differential. Acute diastolic heart failure CKD 3 with volume overload Hypertension Plan: BiPAP as needed Continue Lasix Reduced to 60 mg twice daily Continue hydralazine, Imdur, Norvasc Follow kidney function and electrolytes with  diuresis Repeat BMP pending  Type 2 diabetes, hyperglycemia Plan: Levemir 22 units twice daily Continue CBGs with SSI Advance diet as tolerated  Right pons intraparenchymal hemorrhage, history of CVA with residual right-sided weakness Plan: Supportive care PT OT Likely SNF discharge  Gastroesophageal reflux P: Continue Protonix  Best practice (right click and "Reselect all SmartList Selections" daily)  Diet:  NPO Pain/Anxiety/Delirium protocol (if indicated): No VAP protocol (if indicated): Not indicated DVT prophylaxis: LMWH GI prophylaxis: N/A and PPI Glucose control:  SSI Yes Central venous access:  N/A Arterial line:  N/A Foley:  Yes, and it is still needed Mobility:  bed rest  PT consulted: Yes Last date of multidisciplinary goals of care discussion [n/a] Code Status:  full code Disposition: ICU   Labs   CBC: Recent Labs  Lab 10/09/20 1008 10/11/20 0812 10/12/20 0051  WBC 10.6* 19.0* 12.4*  NEUTROABS 8.7*  --   --   HGB 10.5* 11.3* 10.5*  HCT 31.6* 34.8* 32.5*  MCV 87.5 88.8 87.8  PLT 260 287 [id number]    Basic Metabolic Panel: Recent Labs  Lab 10/09/20 1008 10/11/20 0812 10/12/20 0051  NA 133* 132* 135  K 5.0 4.3 4.2  CL 106 102 106  CO2 20* 18* 21*  GLUCOSE 198* 264* 161*  BUN 36* 35* 37*  CREATININE 1.60* 1.86* 1.82*  CALCIUM 8.6* 8.4* 8.4*  MG  --   --  2.2  PHOS  --   --  4.6   GFR: Estimated Creatinine Clearance: 26.5 mL/min (A) (by C-G formula based on SCr of 1.82 mg/dL (H)). Recent Labs  Lab 10/09/20 1008 10/11/20 0812 10/12/20 0051  WBC 10.6* 19.0* 12.4*    Liver Function Tests: Recent Labs  Lab 10/09/20 1008 10/11/20 0812  AST 17 19  ALT 21 23  ALKPHOS 47 59  BILITOT 0.6 0.7  PROT 6.8 7.1  ALBUMIN 3.0* 3.1*   No results for input(s): LIPASE, AMYLASE in the last 168 hours. No results for input(s): AMMONIA in the last 168 hours.  ABG    Component Value Date/Time   PHART 7.310 (L) 07/25/2019 0620   PCO2ART 51.6 (H)  07/25/2019 0620   PO2ART 87.1 07/25/2019 0620   HCO3 23.7 07/25/2019 0620   TCO2 15 (L) 09/24/2020 2020   ACIDBASEDEF 0.3 07/25/2019 0620   O2SAT 95.8 07/25/2019 0620     Coagulation Profile: No results for input(s): INR, PROTIME in the last 168 hours.  Cardiac Enzymes: No results for input(s): CKTOTAL, CKMB, CKMBINDEX, TROPONINI in the last 168 hours.  HbA1C: Hgb A1c MFr Bld  Date/Time Value Ref Range Status  09/26/2020 03:23 AM 9.9 (H) 4.8 - 5.6 % Final    Comment:    (NOTE) Pre diabetes:          5.7%-6.4%  Diabetes:              >6.4%  Glycemic control for   <7.0% adults with diabetes   02/08/2020 10:03 AM 8.9 (H) 4.8 - 5.6 % Final    Comment:    (NOTE) Pre diabetes:          5.7%-6.4%  Diabetes:              >6.4%  Glycemic control for   <  7.0% adults with diabetes     CBG: Recent Labs  Lab 10/12/20 1108 10/12/20 1557 10/12/20 1940 10/12/20 2352 10/13/20 0343  GLUCAP 141* 316* 245* 249* 199*       Garner Nash, DO [facility] 10/13/2020 7:00 AM

## 2020-10-13 NOTE — Evaluation (Signed)
Occupational Therapy Evaluation Patient Details Name: [name] MRN: [id number] DOB: 17-Jul-1934 Today's Date: 10/13/2020    History of Present Illness Pt is a 86yo female who was re-admitted to [facility] on 5/27 for respiratory distress and hypoxemia from CIR.  She was found to have bil. pulmonary edema. PMH: pt with recent CVA on R side of pons and inferior cerebellar vermis, A-fib on eliquis, DM, CHF, CKD stage III, CHF   Clinical Impression   Pt admitted with above. She demonstrates the below listed deficits and will benefit from continued OT to maximize safety and independence with BADLs.  Pt presents to OT with generalized weakness, impaired balance, impaired cognition, generalized weakness.  She currently requires min A - total A for ADLs.  Pt was transferred to acute from CIR.  Recommend continued post acute rehab at discharge.  Will follow.       Follow Up Recommendations  CIR    Equipment Recommendations  None recommended by OT    Recommendations for Other Services Rehab consult     Precautions / Restrictions Precautions Precautions: Fall Restrictions Weight Bearing Restrictions: No      Mobility Bed Mobility Overal bed mobility: Needs Assistance Bed Mobility: Rolling;Sidelying to Sit Rolling: Min assist Sidelying to sit: Mod assist   Sit to supine: Max assist   General bed mobility comments: pt able to bring LEs off EOB with increased time, HOB elevated, max directional verbal cues, minA for trunk elevation, modA for LE management back up onto bed    Transfers Overall transfer level: Needs assistance Equipment used:  (2 person lift with bed pad) Transfers: Sit to/from Stand Sit to Stand: Max assist;+2 physical assistance         General transfer comment: uable to attempt    Balance Overall balance assessment: Needs assistance Sitting-balance support: Feet supported;Single extremity supported Sitting balance-Leahy Scale: Good Sitting balance - Comments:  worked on dynamic reching, pt able to maintain balance, pt toelrated sitting x 7 min, pt able to wash face wihtout LOB                                   ADL either performed or assessed with clinical judgement   ADL Overall ADL's : Needs assistance/impaired Eating/Feeding: Set up;Supervision/ safety;Sitting   Grooming: Wash/dry hands;Wash/dry face;Oral care;Min guard;Sitting (EOB)   Upper Body Bathing: Minimal assistance;Sitting   Lower Body Bathing: Maximal assistance;Sit to/from stand;Bed level   Upper Body Dressing : Moderate assistance;Sitting   Lower Body Dressing: Total assistance;Sitting/lateral leans;Bed level   Toilet Transfer: Total assistance Toilet Transfer Details (indicate cue type and reason): pt unable Toileting- Clothing Manipulation and Hygiene: Total assistance;+2 for physical assistance;+2 for safety/equipment;Sitting/lateral lean;Bed level       Functional mobility during ADLs: Moderate assistance (bed mobility)       Vision Baseline Vision/History: No visual deficits Wears Glasses: Reading only       Perception Perception Perception Tested?: Yes   Praxis      Pertinent Vitals/Pain Pain Assessment: Faces Faces Pain Scale: Hurts little more Pain Location: hemorrhoids     Hand Dominance Right   Extremity/Trunk Assessment Upper Extremity Assessment Upper Extremity Assessment: Generalized weakness RUE Deficits / Details: AROM WFL.  Grossly 4-/5   Lower Extremity Assessment Lower Extremity Assessment: Defer to PT evaluation   Cervical / Trunk Assessment Cervical / Trunk Assessment: Kyphotic;Other exceptions Cervical / Trunk Exceptions: maintains flexed posture   Communication  Communication Communication: No difficulties   Cognition Arousal/Alertness: Awake/alert Behavior During Therapy: WFL for tasks assessed/performed Overall Cognitive Status: No family/caregiver present to determine baseline cognitive functioning Area of  Impairment: Attention;Following commands;Safety/judgement;Problem solving                   Current Attention Level: Selective   Following Commands: Follows one step commands consistently;Follows multi-step commands inconsistently     Problem Solving: Slow processing;Decreased initiation;Difficulty sequencing;Requires verbal cues General Comments: Pt slow to process info.  She requires min A for problem solving during basic tasks.  Follows simple commands consistently   General Comments  VSS    Exercises Exercises: Other exercises Total Joint Exercises Ankle Circles/Pumps: AROM;Both;10 reps;Supine Long Arc Quad: AROM;Both;10 reps;Seated General Exercises - Lower Extremity Gluteal Sets: AROM;Both;10 reps;Seated Hip Flexion/Marching: AROM;Right;10 reps;Seated Other Exercises Other Exercises: performed 2 sets 10 shoulder felxion each UE while EOB Other Exercises: neutral pelvic tilt with 10 second holds x 10   Shoulder Instructions      Home Living Family/patient expects to be discharged to:: Inpatient rehab Living Arrangements: Children Available Help at Discharge: Family;Available 24 hours/day Type of Home: House Home Access: Ramped entrance     Home Layout: One level     Bathroom Shower/Tub: Occupational psychologist: Handicapped height Bathroom Accessibility: Yes   Home Equipment: Environmental consultant - 2 wheels;Bedside commode;Wheelchair - Brewing technologist;Toilet riser;Tub bench;Hospital bed;Adaptive equipment Adaptive Equipment: Reacher;Sock aid Additional Comments: pt was at [facility] for stroke rehab PTA  Lives With: Son    Prior Functioning/Environment Level of Independence: Needs assistance  Gait / Transfers Assistance Needed: pt was working on lateral slide board transfers at [place] / [employer] Assistance Needed: Pt required assist with ADLs   Comments: household ambulator with RW        OT Problem List: Decreased strength;Decreased activity  tolerance;Impaired balance (sitting and/or standing);Decreased cognition;Decreased safety awareness;Decreased knowledge of use of DME or AE;Cardiopulmonary status limiting activity;Obesity      OT Treatment/Interventions: Self-care/ADL training;Therapeutic exercise;Neuromuscular education;Energy conservation;DME and/or AE instruction;Therapeutic activities;Cognitive remediation/compensation;Patient/family education;Balance training;Manual therapy    OT Goals(Current goals can be found in the care plan section) Acute Rehab OT Goals Patient Stated Goal: to get stronger and go home OT Goal Formulation: With patient Time For Goal Achievement: 10/27/20 Potential to Achieve Goals: Good ADL Goals Pt Will Perform Upper Body Bathing: with supervision;with set-up;sitting Pt Will Perform Lower Body Bathing: with mod assist;sitting/lateral leans;sit to/from stand Pt Will Transfer to Toilet: with mod assist;with +2 assist;squat pivot transfer;bedside commode Pt/caregiver will Perform Home Exercise Program: Increased strength;Both right and left upper extremity;With minimal assist;With written HEP provided  OT Frequency: Min 2X/week   Barriers to D/C:            Co-evaluation              AM-PAC OT "6 Clicks" Daily Activity     Outcome Measure Help from another person eating meals?: A Little Help from another person taking care of personal grooming?: A Little Help from another person toileting, which includes using toliet, bedpan, or urinal?: A Lot Help from another person bathing (including washing, rinsing, drying)?: A Lot Help from another person to put on and taking off regular upper body clothing?: A Lot Help from another person to put on and taking off regular lower body clothing?: Total 6 Click Score: 13   End of Session Nurse Communication: Mobility status  Activity Tolerance: Patient tolerated treatment well Patient left: in bed;with call  bell/phone within reach;with bed alarm  set  OT Visit Diagnosis: Unsteadiness on feet (R26.81);Muscle weakness (generalized) (M62.81);Cognitive communication deficit (R41.841) Symptoms and signs involving cognitive functions: Cerebral infarction                Time: 1545-1611 OT Time Calculation (min): 26 min Charges:  OT General Charges $OT Visit: 1 Visit OT Evaluation $OT Eval Moderate Complexity: 1 Mod OT Treatments $Therapeutic Activity: 8-22 mins  Nilsa Nutting., OTR/L Acute Rehabilitation Services Pager [phone] Office [phone]   Lucille Passy M 10/13/2020, 4:25 PM

## 2020-10-14 LAB — CBC
HCT: 30.6 % — ABNORMAL LOW (ref 36.0–46.0)
Hemoglobin: 10 g/dL — ABNORMAL LOW (ref 12.0–15.0)
MCH: 28.8 pg (ref 26.0–34.0)
MCHC: 32.7 g/dL (ref 30.0–36.0)
MCV: 88.2 fL (ref 80.0–100.0)
Platelets: 245 10*3/uL (ref 150–400)
RBC: 3.47 MIL/uL — ABNORMAL LOW (ref 3.87–5.11)
RDW: 14.9 % (ref 11.5–15.5)
WBC: 10.6 10*3/uL — ABNORMAL HIGH (ref 4.0–10.5)
nRBC: 0 % (ref 0.0–0.2)

## 2020-10-14 LAB — BASIC METABOLIC PANEL
Anion gap: 9 (ref 5–15)
BUN: 29 mg/dL — ABNORMAL HIGH (ref 8–23)
CO2: 27 mmol/L (ref 22–32)
Calcium: 8.2 mg/dL — ABNORMAL LOW (ref 8.9–10.3)
Chloride: 99 mmol/L (ref 98–111)
Creatinine, Ser: 1.99 mg/dL — ABNORMAL HIGH (ref 0.44–1.00)
GFR, Estimated: 24 mL/min — ABNORMAL LOW (ref >60)
Glucose, Bld: 228 mg/dL — ABNORMAL HIGH (ref 70–99)
Potassium: 3.4 mmol/L — ABNORMAL LOW (ref 3.5–5.1)
Sodium: 135 mmol/L (ref 135–145)

## 2020-10-14 LAB — GLUCOSE, CAPILLARY
Glucose-Capillary: 140 mg/dL — ABNORMAL HIGH (ref 70–99)
Glucose-Capillary: 171 mg/dL — ABNORMAL HIGH (ref 70–99)
Glucose-Capillary: 250 mg/dL — ABNORMAL HIGH (ref 70–99)
Glucose-Capillary: 239 mg/dL — ABNORMAL HIGH (ref 70–99)
Glucose-Capillary: 161 mg/dL — ABNORMAL HIGH (ref 70–99)

## 2020-10-14 LAB — TROPONIN I (HIGH SENSITIVITY): Troponin I (High Sensitivity): 53 ng/L — ABNORMAL HIGH (ref <18)

## 2020-10-14 MED ORDER — DIBUCAINE (PERIANAL) 1 % EX OINT
TOPICAL_OINTMENT | CUTANEOUS | Status: DC | PRN
  Filled 2020-10-14: qty 28

## 2020-10-14 MED ORDER — METOPROLOL SUCCINATE ER 25 MG PO TB24
25.0000 mg | ORAL_TABLET | Freq: Every day | ORAL | Status: DC
  Administered 2020-10-14 – 2020-10-17 (×4): 25 mg via ORAL
  Filled 2020-10-14 (×4): qty 1

## 2020-10-14 MED ORDER — DIBUCAINE (PERIANAL) 1 % EX OINT: TOPICAL_OINTMENT | CUTANEOUS | Status: DC | PRN

## 2020-10-14 MED ORDER — ACETAMINOPHEN 325 MG PO TABS
650.0000 mg | ORAL_TABLET | Freq: Four times a day (QID) | ORAL | Status: DC | PRN
  Administered 2020-10-14 (×2): 650 mg via ORAL
  Filled 2020-10-14 (×3): qty 2

## 2020-10-14 NOTE — Progress Notes (Signed)
Inpatient Rehab Admissions Coordinator:   Met with patient at bedside.  She c/o nausea/vomitting today as well as left sided abdominal pain.  She is open to returning to rehab when ready, and I let her know I will need insurance auth to proceed.  [employer] is closed today so I will start that tomorrow.   Shann Medal, PT, DPT Admissions Coordinator [phone] 10/14/20  12:45 PM

## 2020-10-14 NOTE — Progress Notes (Signed)
Physical Therapy Treatment Patient Details Name: [name] MRN: [id number] DOB: 1934-09-24 Today's Date: 10/14/2020    History of Present Illness Pt is a 86yo female who was re-admitted on 5/27 for respiratory distress and hypoxemia from CIR.  She was found to have bil. pulmonary edema. PMH: pt with recent CVA on R side of pons and inferior cerebellar vermis, A-fib on eliquis, DM, CHF, CKD stage III, CHF    PT Comments    Pt was limited today due to nausea.  She did agree to bed exercises but did not feel like getting to the EOB.  Good participation with exercises and cues.  Continue to progress as able.    Follow Up Recommendations  CIR     Equipment Recommendations  Wheelchair (measurements PT);Wheelchair cushion (measurements PT);Other (comment);3in1 (PT) (sliding board, drop arms on w/c and 3 in 1; further assessment with return to CIR)    Recommendations for Other Services       Precautions / Restrictions Precautions Precautions: Fall    Mobility  Bed Mobility               General bed mobility comments: Declined OOB due to nausea    Transfers                    Ambulation/Gait                 Stairs             Wheelchair Mobility    Modified Rankin (Stroke Patients Only)       Balance                                            Cognition Arousal/Alertness: Awake/alert Behavior During Therapy: WFL for tasks assessed/performed Overall Cognitive Status: No family/caregiver present to determine baseline cognitive functioning                                 General Comments: Pt slow to process info.  She requires min A for problem solving during basic tasks.  Follows simple commands consistently      Exercises General Exercises - Upper Extremity Shoulder Flexion: AROM;Both;10 reps;Supine General Exercises - Lower Extremity Ankle Circles/Pumps: AROM;Both;10 reps;Supine Short Arc Quad:  AROM;Both;10 reps;Supine Heel Slides: AAROM;Both;10 reps;Supine Hip ABduction/ADduction: AAROM;Both;10 reps;Supine Other Exercises Other Exercises: Cues for all exercises for full ROM and correct form    General Comments        Pertinent Vitals/Pain Pain Assessment: Faces Faces Pain Scale: Hurts a little bit Pain Location: left side abdomen Pain Descriptors / Indicators: Discomfort Pain Intervention(s): Limited activity within patient's tolerance;Monitored during session    Home Living                      Prior Function            PT Goals (current goals can now be found in the care plan section) Acute Rehab PT Goals Patient Stated Goal: to get stronger and go home PT Goal Formulation: With patient/family Time For Goal Achievement: 10/27/20 Potential to Achieve Goals: Good Progress towards PT goals: Progressing toward goals    Frequency    Min 4X/week      PT Plan Current plan remains appropriate    Co-evaluation  AM-PAC PT "6 Clicks" Mobility   Outcome Measure  Help needed turning from your back to your side while in a flat bed without using bedrails?: A Lot Help needed moving from lying on your back to sitting on the side of a flat bed without using bedrails?: A Lot Help needed moving to and from a bed to a chair (including a wheelchair)?: Total Help needed standing up from a chair using your arms (e.g., wheelchair or bedside chair)?: Total Help needed to walk in hospital room?: Total Help needed climbing 3-5 steps with a railing? : Total 6 Click Score: 8    End of Session   Activity Tolerance: Other (comment) (limited due to nausea) Patient left: in bed;with call bell/phone within reach;with bed alarm set (staff into get EKG) Nurse Communication: Mobility status PT Visit Diagnosis: Other abnormalities of gait and mobility (R26.89);Muscle weakness (generalized) (M62.81);Difficulty in walking, not elsewhere classified (R26.2)      Time: [id number] PT Time Calculation (min) (ACUTE ONLY): 13 min  Charges:  $Therapeutic Exercise: 8-22 mins                     Abran Richard, PT Acute Rehab Services Pager [phone] [facility] [place] 10/14/2020, 1:49 PM

## 2020-10-14 NOTE — Progress Notes (Signed)
PROGRESS NOTE    [name]  [id number] DOB: 1935/02/13 DOA: 10/11/2020 PCP: [name], MD  Outpatient Specialists:   Brief Narrative:  As per prior documentation: Patient is an 86 year old African-American female with past medical history significant for stroke with right-sided weakness, chronic atrial fibrillation on Eliquis, diabetes, chronic diastolic heart failure, CKD stage III.  Patient initially presented to the hospital on May 10 with acute onset headache, slurred speech.  Patient had MRI which redemonstrated acute intraparenchymal hemorrhage on the right side of the pons and inferior cerebellar vermis.  She also had a small subarachnoid bleed.  Patient had blood pressure control with Cleviprex, she was on Eliquis and did receive andexanet alfa for reversal.  Ultimately she had a decreased functional ability and decision was made for admission to the rehab facility.  Patient's hospital course during inpatient rehab is included PT, SLP and OT.  Rate controlled A. fib.  Eliquis remains on hold.  Blood pressures been treated with Norvasc, Imdur and hydralazine plus clonidine.  Lasix for her diastolic heart failure and CKD 3.  On the morning of 10/11/2020 patient was found to be in respiratory distress and hypoxemic. Pulmonary critical care was consulted for evaluation.  Patient had a chest x-ray that was completed this morning that revealed bilateral airspace disease concerning for pneumonia versus pulmonary edema.  Decision was made for transfer to the intensive care unit after being placed on BiPAP for support  10/14/2020: Hospitalist team has been asked to assume care of patient.  Patient reports right lower lateral chest wall pain.  The pain seems reproducible.  Patient has associated nausea.   Assessment & Plan:   Active Problems:   Acute respiratory distress  Acute hypoxemic respiratory failure requiring BiPAP/Acute bilateral pulmonary edema/chest x-ray with bilateral  infiltrates: -Respiratory failure has improved significantly. -Patient is on 2 L/min of supplemental oxygen via nasal cannula. -Patient remains on IV Zosyn 3.375 g every 8 hourly.   -Leukocytosis continues downward trend. -Patient is on IV Lasix 60 Mg twice daily.   -Continue to monitor I's and O's, renal function and electrolytes.    Acute on chronic diastolic heart failure: -Patient is on IV diuretics. -We will add oral beta-blocker. -Echocardiogram done on 09/25/2020 revealed grade 2 diastolic dysfunction, with normal left ventricular ejection fraction.  CKD 3b/4: -Likely secondary to combined diabetes mellitus and hypertension. -Follow-up with nephrology team on discharge. -Continue to monitor renal function, I's and O's and electrolytes.   Hypertension: -We will continue to optimize. -We will add metoprolol. -Goal blood pressure should be equal to or less than 130/80 mmHg.  Type 2 diabetes: -Continue subcutaneous Levemir 22 units twice daily. -Continue sliding scale insulin coverage. -Continue to optimize blood sugar.  Right pons intraparenchymal hemorrhage: -Patient was on Eliquis for atrial fibrillation. -History of CVA with residual right-sided weakness -Consider repeat CT head if nausea persist. -Continue to hold Eliquis.  Gastroesophageal reflux -Continue Protonix   DVT prophylaxis: SCD. Code Status: Full code Family Communication:  Disposition Plan: CIR   Consultants:   Transferred from the ICU team.  CIR  Procedures:   None  Antimicrobials:   IV Zosyn   Subjective: -Nausea  Objective: Vitals:   10/13/20 1533 10/13/20 2113 10/14/20 0450 10/14/20 0815  BP: (!) 159/54 (!) 164/69 (!) 152/68 (!) 166/56  Pulse: 80 83 86 85  Resp: '19 17 20 20  '$ Temp: 98.2 F (36.8 C) 98.3 F (36.8 C) 98.1 F (36.7 C) 98.4 F (36.9 C)  TempSrc:  Oral  SpO2: 100% 99% 100% 100%  Weight:        Intake/Output Summary (Last 24 hours) at 10/14/2020 1046 Last  data filed at 10/14/2020 0316 Gross per 24 hour  Intake 269.16 ml  Output 2800 ml  Net -2530.84 ml   Filed Weights   10/11/20 0900 10/12/20 0404 10/13/20 0600  Weight: 106.7 kg 103.5 kg 100.5 kg    Examination:  General exam: Appears calm and comfortable.  Patient is pale. Respiratory system: Clear to auscultation, however, patient has decreased air entry globally. Cardiovascular system: S1 & S2 heard Gastrointestinal system: Abdomen is nondistended, soft and nontender. No organomegaly or masses felt. Normal bowel sounds heard. Central nervous system: Alert and oriented.  Moves all extremities.   Extremities: Mild leg edema.  Data Reviewed: I have personally reviewed following labs and imaging studies  CBC: Recent Labs  Lab 10/09/20 1008 10/11/20 0812 10/12/20 0051 10/14/20 0119  WBC 10.6* 19.0* 12.4* 10.6*  NEUTROABS 8.7*  --   --   --   HGB 10.5* 11.3* 10.5* 10.0*  HCT 31.6* 34.8* 32.5* 30.6*  MCV 87.5 88.8 87.8 88.2  PLT 260 287 288 [id number]   Basic Metabolic Panel: Recent Labs  Lab 10/09/20 1008 10/11/20 0812 10/12/20 0051 10/14/20 0119  NA 133* 132* 135 135  K 5.0 4.3 4.2 3.4*  CL 106 102 106 99  CO2 20* 18* 21* 27  GLUCOSE 198* 264* 161* 228*  BUN 36* 35* 37* 29*  CREATININE 1.60* 1.86* 1.82* 1.99*  CALCIUM 8.6* 8.4* 8.4* 8.2*  MG  --   --  2.2  --   PHOS  --   --  4.6  --    GFR: Estimated Creatinine Clearance: 24.3 mL/min (A) (by C-G formula based on SCr of 1.99 mg/dL (H)). Liver Function Tests: Recent Labs  Lab 10/09/20 1008 10/11/20 0812  AST 17 19  ALT 21 23  ALKPHOS 47 59  BILITOT 0.6 0.7  PROT 6.8 7.1  ALBUMIN 3.0* 3.1*   No results for input(s): LIPASE, AMYLASE in the last 168 hours. No results for input(s): AMMONIA in the last 168 hours. Coagulation Profile: No results for input(s): INR, PROTIME in the last 168 hours. Cardiac Enzymes: No results for input(s): CKTOTAL, CKMB, CKMBINDEX, TROPONINI in the last 168 hours. BNP (last 3  results) No results for input(s): PROBNP in the last 8760 hours. HbA1C: No results for input(s): HGBA1C in the last 72 hours. CBG: Recent Labs  Lab 10/13/20 1656 10/13/20 1914 10/13/20 2324 10/14/20 0313 10/14/20 0741  GLUCAP 251* 276* 225* 140* 171*   Lipid Profile: No results for input(s): CHOL, HDL, LDLCALC, TRIG, CHOLHDL, LDLDIRECT in the last 72 hours. Thyroid Function Tests: No results for input(s): TSH, T4TOTAL, FREET4, T3FREE, THYROIDAB in the last 72 hours. Anemia Panel: No results for input(s): VITAMINB12, FOLATE, FERRITIN, TIBC, IRON, RETICCTPCT in the last 72 hours. Urine analysis:    Component Value Date/Time   COLORURINE YELLOW 09/25/2020 0806   APPEARANCEUR HAZY (A) 09/25/2020 0806   LABSPEC 1.024 09/25/2020 0806   PHURINE 5.0 09/25/2020 0806   GLUCOSEU >=500 (A) 09/25/2020 0806   HGBUR NEGATIVE 09/25/2020 0806   BILIRUBINUR NEGATIVE 09/25/2020 0806   KETONESUR NEGATIVE 09/25/2020 0806   PROTEINUR 100 (A) 09/25/2020 0806   UROBILINOGEN 0.2 02/20/2015 1200   NITRITE NEGATIVE 09/25/2020 0806   LEUKOCYTESUR NEGATIVE 09/25/2020 0806   Sepsis Labs: '@LABRCNTIP'$ (procalcitonin:4,lacticidven:4)  )No results found for this or any previous visit (from the past 240 hour(s)).  Radiology Studies: DG Swallowing Func-Speech Pathology  Result Date: 10/13/2020 Objective Swallowing Evaluation: Type of Study: MBS-Modified Barium Swallow Study  Patient Details Name: TASHIANNA SABATINE MRN: [id number] Date of Birth: 1935/01/13 Today's Date: 10/13/2020 Time: SLP Start Time (ACUTE ONLY): 1020 -SLP Stop Time (ACUTE ONLY): 1040 SLP Time Calculation (min) (ACUTE ONLY): 20 min Past Medical History: Past Medical History: Diagnosis Date . Arthritis  . CHF (congestive heart failure) ([place])  . Chronic diastolic heart failure ([place])  . CKD (chronic kidney disease) stage 3, GFR 30-59 ml/min (HCC)  . Essential hypertension  . GERD (gastroesophageal reflux disease)  . Hemorrhoids  . History of  stroke  . Type 2 diabetes mellitus ([place])  Past Surgical History: Past Surgical History: Procedure Laterality Date . BACK SURGERY   . CATARACT EXTRACTION W/PHACO Left 02/28/2013  Procedure: CATARACT EXTRACTION PHACO AND INTRAOCULAR LENS PLACEMENT (IOL) CDE=15.60;  Surgeon: Elta Guadeloupe T. Gershon Crane, MD;  Location: [facility];  Service: Ophthalmology;  Laterality: Left; . CHOLECYSTECTOMY   . COLONOSCOPY  12/12/2003  RMR: Normal rectum.  Long capacious tortuous colon, colonic mucosa appeared normal . COLONOSCOPY N/A 03/21/2014  Dr. [name]: internal and external hemorrhoids, torturous colon, colonic diverticulosis  . ESOPHAGOGASTRODUODENOSCOPY  12/28/2001  [id number] sliding hiatal hernia/. Three small bulbar ulcers, two with stigmata of bleeding and these were coagulated using heater probe unit  . ESOPHAGOGASTRODUODENOSCOPY N/A 03/21/2014  Dr. [name]: cervical esophageal web s/p dilation, negative H.pylori  . EYE SURGERY   . HIP FRACTURE SURGERY  2008 . JOINT REPLACEMENT    Rt hip, ????? sounds like just for fracture . MALONEY DILATION N/A 03/21/2014  Procedure: [name] DILATION;  Surgeon: [name], MD;  Location: [facility];  Service: Endoscopy;  Laterality: N/A; . SAVORY DILATION N/A 03/21/2014  Procedure: SAVORY DILATION;  Surgeon: [name], MD;  Location: [facility];  Service: Endoscopy;  Laterality: N/A; HPI: Pt is an 86 yo woman who initially presented to the hospital on May 10 with acute onset headache, slurred speech.  Patient had MRI which redemonstrated acute intraparenchymal hemorrhage on the right side of the pons and inferior cerebellar vermis.  She also had a small subarachnoid bleed. Pt transferred to CIR when stable.  Pt seen by ST in CIR with pt on Dys2/Thins and having trials of mechanical soft. On the morning of 10/11/2020 patient was found to be in respiratory distress and hypoxemic.  Pulmonary critical care was consulted for evaluation.  Patient had a chest x-ray that was completed this morning that  revealed bilateral airspace disease concerning for pneumonia versus pulmonary edema.  Decision was made for transfer to the intensive care unit after being placed on BiPAP for support.  CXR 5/28: "Pulmonary vascular congestion. No frank interstitial edema,  improved."  Subjective: Awake, alert, pleasant, participative Assessment / Plan / Recommendation CHL IP CLINICAL IMPRESSIONS 10/13/2020 Clinical Impression Pt demonstrates normal oral and oropharyngeal function. Pt is noted to have phonation with exhalation, which can sound like throat clearing at times, though no outright throat clearing occurred during testing. There were one or two instances of flash penetration. Otherwise, primary impariment appears esopahgeal; esophageal sweep shows significant stasis and pt had backflow of esophageal stasis to oropharynx. Question if this could have contributed to pts possible aspiration event. Pt should continue mech soft diet, follow solids with liquids, eat upright in chair and stay upright after meals. Meals should be eaten at a slow pace. Will f/u for further education on strategies. SLP Visit Diagnosis Dysphagia, pharyngoesophageal phase (  R13.14) Attention and concentration deficit following -- Frontal lobe and executive function deficit following -- Impact on safety and function Moderate aspiration risk   CHL IP TREATMENT RECOMMENDATION 10/13/2020 Treatment Recommendations Therapy as outlined in treatment plan below   Prognosis 10/13/2020 Prognosis for Safe Diet Advancement Good Barriers to Reach Goals -- Barriers/Prognosis Comment -- CHL IP DIET RECOMMENDATION 10/13/2020 SLP Diet Recommendations Dysphagia 3 (Mech soft) solids;Thin liquid Liquid Administration via Cup;Straw Medication Administration Whole meds with puree Compensations Slow rate;Small sips/bites;Follow solids with liquid Postural Changes Seated upright at 90 degrees;Remain semi-upright after after feeds/meals (Comment)   CHL IP OTHER RECOMMENDATIONS  10/13/2020 Recommended Consults Consider esophageal assessment Oral Care Recommendations Oral care BID Other Recommendations --   CHL IP FOLLOW UP RECOMMENDATIONS 10/13/2020 Follow up Recommendations Inpatient Rehab   CHL IP FREQUENCY AND DURATION 10/13/2020 Speech Therapy Frequency (ACUTE ONLY) min 2x/week Treatment Duration 2 weeks      CHL IP ORAL PHASE 10/13/2020 Oral Phase WFL Oral - Pudding Teaspoon -- Oral - Pudding Cup -- Oral - Honey Teaspoon -- Oral - Honey Cup -- Oral - Nectar Teaspoon -- Oral - Nectar Cup -- Oral - Nectar Straw -- Oral - Thin Teaspoon -- Oral - Thin Cup -- Oral - Thin Straw -- Oral - Puree -- Oral - Mech Soft -- Oral - Regular -- Oral - Multi-Consistency -- Oral - Pill -- Oral Phase - Comment --  CHL IP PHARYNGEAL PHASE 10/13/2020 Pharyngeal Phase WFL Pharyngeal- Pudding Teaspoon -- Pharyngeal -- Pharyngeal- Pudding Cup -- Pharyngeal -- Pharyngeal- Honey Teaspoon -- Pharyngeal -- Pharyngeal- Honey Cup -- Pharyngeal -- Pharyngeal- Nectar Teaspoon -- Pharyngeal -- Pharyngeal- Nectar Cup -- Pharyngeal -- Pharyngeal- Nectar Straw -- Pharyngeal -- Pharyngeal- Thin Teaspoon -- Pharyngeal -- Pharyngeal- Thin Cup -- Pharyngeal -- Pharyngeal- Thin Straw -- Pharyngeal -- Pharyngeal- Puree -- Pharyngeal -- Pharyngeal- Mechanical Soft -- Pharyngeal -- Pharyngeal- Regular -- Pharyngeal -- Pharyngeal- Multi-consistency -- Pharyngeal -- Pharyngeal- Pill -- Pharyngeal -- Pharyngeal Comment --  CHL IP CERVICAL ESOPHAGEAL PHASE 10/13/2020 Cervical Esophageal Phase Impaired Pudding Teaspoon -- Pudding Cup -- Honey Teaspoon -- Honey Cup -- Nectar Teaspoon -- Nectar Cup -- Nectar Straw -- Thin Teaspoon -- Thin Cup WFL Thin Straw WFL Puree Other (Comment) Mechanical Soft -- Regular Esophageal backflow into the pharynx Multi-consistency -- Pill -- Cervical Esophageal Comment -- Herbie Baltimore, MA CCC-SLP Acute Rehabilitation Services Pager [phone] Office [phone] Lynann Beaver 10/13/2020, 2:15 PM                    Scheduled Meds: . amLODipine  10 mg Oral Daily  . Chlorhexidine Gluconate Cloth  6 each Topical Daily  . DULoxetine  30 mg Oral Daily  . furosemide  60 mg Intravenous BID  . hydrALAZINE  100 mg Oral Q8H  . hydrocortisone-pramoxine  1 applicator Rectal BID  . insulin aspart  0-15 Units Subcutaneous Q4H  . insulin detemir  22 Units Subcutaneous BID  . isosorbide mononitrate  60 mg Oral Daily  . pantoprazole  40 mg Oral Q1200   Continuous Infusions: . piperacillin-tazobactam (ZOSYN)  IV 3.375 g (10/14/20 0604)     LOS: 3 days    Time spent: 35 minutes    Dana Allan, MD  [facility] Pager #: [phone] 7PM-7AM contact night coverage as above

## 2020-10-14 NOTE — Plan of Care (Signed)

## 2020-10-14 NOTE — Progress Notes (Signed)
  Speech Language Pathology Treatment: Dysphagia  Patient Details Name: [name] MRN: [id number] DOB: 1934/09/06 Today's Date: 10/14/2020 Time: [id number] SLP Time Calculation (min) (ACUTE ONLY): 20 min  Assessment / Plan / Recommendation Clinical Impression  Pt seen with daughters at bedside. She had an emesis basin with particles of meat and eggs visible from am meal. Pt confirms she did cough her food back up. She says she did go slow at meal and followed bites with sips, but was unsupervised and ate in bed. Her daughters say she always eats too quickly and we both agree she probably did not really slow down enough. The sign SLP made for her is not present, so a new sign was placed, though recommendation is now for puree with the hope that pt can keep her food down. Further strategies were reviewed with pt, daughters, RN and MD. Encouraged pt to start liquid nutritional supplements. Recommend referral to GI at this point given severity of problem and that is impacting her safety with swallowing.   HPI HPI: Pt is an 86 yo woman who initially presented to the hospital on May 10 with acute onset headache, slurred speech.  Patient had MRI which redemonstrated acute intraparenchymal hemorrhage on the right side of the pons and inferior cerebellar vermis.  She also had a small subarachnoid bleed. Pt transferred to CIR when stable.  Pt seen by ST in CIR with pt on Dys2/Thins and having trials of mechanical soft. On the morning of 10/11/2020 patient was found to be in respiratory distress and hypoxemic.  Pulmonary critical care was consulted for evaluation.  Patient had a chest x-ray that was completed this morning that revealed bilateral airspace disease concerning for pneumonia versus pulmonary edema.  Decision was made for transfer to the intensive care unit after being placed on BiPAP for support.  CXR 5/28: "Pulmonary vascular congestion. No frank interstitial edema,  improved."      SLP Plan  Continue  with current plan of care       Recommendations  Diet recommendations: Dysphagia 1 (puree);Thin liquid Liquids provided via: Cup;Straw Medication Administration: Crushed with puree Supervision: Full supervision/cueing for compensatory strategies Compensations: Slow rate;Small sips/bites;Follow solids with liquid Postural Changes and/or Swallow Maneuvers: Seated upright 90 degrees                Follow up Recommendations: Inpatient Rehab SLP Visit Diagnosis: Dysphagia, pharyngoesophageal phase (R13.14) Plan: Continue with current plan of care       GO              Herbie Baltimore, MA [place] Pager [phone] Office [phone]   Lynann Beaver 10/14/2020, 10:39 AM

## 2020-10-14 NOTE — Progress Notes (Signed)
Pharmacy Antibiotic Note  [name] is a 86 y.o. female admitted on 10/11/2020 with aspiration pneumoniawith respiratory distress requiring BiPAP.  Pharmacy has been consulted for Zosyn dosing. This is day 4 of therapy. The patient CXR shows assymmetric pulmonary edema with PNA on differential diagnosis. WBC 10.6, afeb, CrCl 24.23m/min.   Plan: Zosyn 3.375g IV q8h (4 hour infusion).  F/u cultures, clinical progress, renal function  F/u length of therapy   Weight: 100.5 kg (221 lb 9 oz)  Temp (24hrs), Avg:98.2 F (36.8 C), Min:97.9 F (36.6 C), Max:98.4 F (36.9 C)  Recent Labs  Lab 10/09/20 1008 10/11/20 0812 10/12/20 0051 10/14/20 0119  WBC 10.6* 19.0* 12.4* 10.6*  CREATININE 1.60* 1.86* 1.82* 1.99*    Estimated Creatinine Clearance: 24.3 mL/min (A) (by C-G formula based on SCr of 1.99 mg/dL (H)).    No Known Allergies  Antimicrobials this admission: Zosyn 5/27>   Microbiology results: 5/11 MRSA neg   Thank you for allowing pharmacy to be a part of this patient's care.  ACephus Slater PharmD, MMill [facility] EastPharmacy Resident ([phone]/30/2022 10:26 AM

## 2020-10-15 DIAGNOSIS — I5033 Acute on chronic diastolic (congestive) heart failure: Secondary | ICD-10-CM

## 2020-10-15 DIAGNOSIS — J189 Pneumonia, unspecified organism: Secondary | ICD-10-CM

## 2020-10-15 DIAGNOSIS — Y95 Nosocomial condition: Secondary | ICD-10-CM

## 2020-10-15 LAB — BASIC METABOLIC PANEL
Anion gap: 8 (ref 5–15)
BUN: 26 mg/dL — ABNORMAL HIGH (ref 8–23)
CO2: 33 mmol/L — ABNORMAL HIGH (ref 22–32)
Calcium: 8.6 mg/dL — ABNORMAL LOW (ref 8.9–10.3)
Chloride: 96 mmol/L — ABNORMAL LOW (ref 98–111)
Creatinine, Ser: 1.87 mg/dL — ABNORMAL HIGH (ref 0.44–1.00)
GFR, Estimated: 26 mL/min — ABNORMAL LOW (ref >60)
Glucose, Bld: 198 mg/dL — ABNORMAL HIGH (ref 70–99)
Potassium: 3.2 mmol/L — ABNORMAL LOW (ref 3.5–5.1)
Sodium: 137 mmol/L (ref 135–145)

## 2020-10-15 LAB — CBC
HCT: 31.3 % — ABNORMAL LOW (ref 36.0–46.0)
Hemoglobin: 10.1 g/dL — ABNORMAL LOW (ref 12.0–15.0)
MCH: 28.8 pg (ref 26.0–34.0)
MCHC: 32.3 g/dL (ref 30.0–36.0)
MCV: 89.2 fL (ref 80.0–100.0)
Platelets: 267 10*3/uL (ref 150–400)
RBC: 3.51 MIL/uL — ABNORMAL LOW (ref 3.87–5.11)
RDW: 15.1 % (ref 11.5–15.5)
WBC: 10 10*3/uL (ref 4.0–10.5)
nRBC: 0 % (ref 0.0–0.2)

## 2020-10-15 LAB — GLUCOSE, CAPILLARY
Glucose-Capillary: 169 mg/dL — ABNORMAL HIGH (ref 70–99)
Glucose-Capillary: 187 mg/dL — ABNORMAL HIGH (ref 70–99)
Glucose-Capillary: 187 mg/dL — ABNORMAL HIGH (ref 70–99)
Glucose-Capillary: 200 mg/dL — ABNORMAL HIGH (ref 70–99)
Glucose-Capillary: 280 mg/dL — ABNORMAL HIGH (ref 70–99)
Glucose-Capillary: 341 mg/dL — ABNORMAL HIGH (ref 70–99)
Glucose-Capillary: 352 mg/dL — ABNORMAL HIGH (ref 70–99)
Glucose-Capillary: 424 mg/dL — ABNORMAL HIGH (ref 70–99)

## 2020-10-15 LAB — GLUCOSE, RANDOM: Glucose, Bld: 426 mg/dL — ABNORMAL HIGH (ref 70–99)

## 2020-10-15 MED ORDER — POTASSIUM CHLORIDE CRYS ER 20 MEQ PO TBCR
20.0000 meq | EXTENDED_RELEASE_TABLET | Freq: Once | ORAL | Status: AC
  Administered 2020-10-15: 20 meq via ORAL
  Filled 2020-10-15: qty 1

## 2020-10-15 MED ORDER — FUROSEMIDE 40 MG PO TABS
40.0000 mg | ORAL_TABLET | Freq: Every day | ORAL | Status: DC
  Administered 2020-10-15 – 2020-10-17 (×3): 40 mg via ORAL
  Filled 2020-10-15 (×3): qty 1

## 2020-10-15 NOTE — NC FL2 (Signed)
[place] LEVEL OF CARE SCREENING TOOL     IDENTIFICATION  Patient Name: [name] Birthdate: December 04, 1934 Sex: female Admission Date (Current Location): 10/11/2020  [facility] and [place] Number:  Herbalist and Address:  [facility] [place], [place] [address], [place], [place] [address]      Provider Number: [id number]  Attending Physician Name and Address:  Bonnell Public, MD  Relative Name and Phone Number:  Johny Shears Daughter   [phone]    Current Level of Care:   Recommended Level of Care: [place] Prior Approval Number:    Date Approved/Denied:   PASRR Number: [id number]  Discharge Plan: SNF    Current Diagnoses: Patient Active Problem List   Diagnosis Date Noted  . Acute respiratory distress 10/11/2020  . Sinus tachycardia   . Respiratory distress   . Uncontrolled type 2 diabetes mellitus with hyperglycemia ([place])   . Controlled type 2 diabetes mellitus with hyperglycemia, with long-term current use of insulin ([place])   . Intraparenchymal hemorrhage of brain ([place]) 09/30/2020  . ICH (intracerebral hemorrhage) ([place]) 09/24/2020  . Pneumonia due to COVID-19 virus 02/12/2020  . Acute hypoxemic respiratory failure due to COVID-19 ([place]) 02/08/2020  . Educated about COVID-19 virus infection 01/11/2020  . Acute urinary retention   . Acute pulmonary edema (HCC)   . AKI (acute kidney injury) ([place]) 07/25/2019  . Acute respiratory failure with hypercapnia ([place]) 07/24/2019  . Pancreatitis, acute 07/23/2019  . Diabetes mellitus type 2 with complications ([place]) [id number]  . Atrial fibrillation, chronic ([place]) 07/23/2019  . Chronic diastolic CHF (congestive heart failure) (HCC)/EF > 60 % 07/23/2019  . Shortness of breath 06/19/2019  . Acute diastolic CHF (congestive heart failure) ([place]) 07/08/2017  . Acute respiratory failure with hypoxemia ([place]) 07/08/2017  . Uncontrolled type 2 diabetes mellitus with diabetic  nephropathy, with long-term current use of insulin ([place]) 07/08/2017  . CKD (chronic kidney disease), stage IIIb 07/08/2017  . Essential hypertension 07/08/2017  . Hypertension 06/23/2017  . Prolapsed internal hemorrhoids, grade 3 06/25/2016  . Abdominal pain 05/08/2015  . Dysphagia, pharyngoesophageal phase   . Other hemorrhoids   . Diverticulosis of colon without hemorrhage   . GERD (gastroesophageal reflux disease) 02/28/2014  . Constipation 02/28/2014  . Esophageal dysphagia 02/28/2014  . Rectal bleeding 02/28/2014  . OSTEOARTHRITIS, KNEE, RIGHT, SEVERE 12/13/2008  . DEGENERATIVE DISC DISEASE, LUMBOSACRAL SPINE W/RADICULOPATHY 12/13/2008  . BACK PAIN 12/13/2008  . DIABETES 04/13/2007  . FRACTURE, FEMUR, INTERTROCHANTERIC REGION 04/13/2007    Orientation RESPIRATION BLADDER Height & Weight     Self,Time,Situation,Place  Normal External catheter Weight: 227 lb 1.2 oz (103 kg) Height:     BEHAVIORAL SYMPTOMS/MOOD NEUROLOGICAL BOWEL NUTRITION STATUS      Continent Diet (see discharge summary)  AMBULATORY STATUS COMMUNICATION OF NEEDS Skin   Total Care Verbally Normal                       Personal Care Assistance Level of Assistance  Bathing,Feeding,Dressing Bathing Assistance: Maximum assistance Feeding assistance: Independent Dressing Assistance: Maximum assistance     Functional Limitations Info  Sight,Hearing,Speech Sight Info: Adequate Hearing Info: Adequate Speech Info: Adequate    SPECIAL CARE FACTORS FREQUENCY  PT (By licensed PT),OT (By licensed OT)     PT Frequency: 5x week OT Frequency: 5x week            Contractures Contractures Info: Not present    Additional Factors Info  Code  Status,Allergies,Insulin Sliding Scale Code Status Info: full Allergies Info: NKA   Insulin Sliding Scale Info: novolog, 0-15 units q4 hours.  See discharge summary       Current Medications (10/15/2020):  This is the current hospital active medication  list Current Facility-Administered Medications  Medication Dose Route Frequency Provider Last Rate Last Admin  . acetaminophen (TYLENOL) tablet 650 mg  650 mg Oral Q6H PRN Jennelle Human B, NP   650 mg at 10/14/20 1324  . amLODipine (NORVASC) tablet 10 mg  10 mg Oral Daily Ollis, Brandi L, NP   10 mg at 10/15/20 0934  . Chlorhexidine Gluconate Cloth 2 % PADS 6 each  6 each Topical Daily Icard, Bradley L, DO   6 each at 10/15/20 1210  . dibucaine (NUPERCAINAL) 1 % rectal ointment   Rectal PRN Icard, Bradley L, DO      . docusate sodium (COLACE) capsule 100 mg  100 mg Oral BID PRN Noe Gens L, NP   100 mg at 10/15/20 0936  . DULoxetine (CYMBALTA) DR capsule 30 mg  30 mg Oral Daily Ollis, Brandi L, NP   30 mg at 10/15/20 0934  . furosemide (LASIX) injection 60 mg  60 mg Intravenous BID Icard, Bradley L, DO   60 mg at 10/15/20 0750  . hydrALAZINE (APRESOLINE) tablet 100 mg  100 mg Oral Q8H Ollis, Brandi L, NP   100 mg at 10/15/20 1343  . hydrocortisone-pramoxine (PROCTOFOAM-HC) rectal foam 1 applicator  1 applicator Rectal BID Theotis Burrow, RPH   1 applicator at [id number]  . insulin aspart (novoLOG) injection 0-15 Units  0-15 Units Subcutaneous Q4H Ollis, Brandi L, NP   3 Units at 10/15/20 1210  . insulin detemir (LEVEMIR) injection 22 Units  22 Units Subcutaneous BID Icard, Bradley L, DO   22 Units at 10/15/20 0934  . isosorbide mononitrate (IMDUR) 24 hr tablet 60 mg  60 mg Oral Daily Ollis, Brandi L, NP   60 mg at 10/15/20 0934  . metoprolol succinate (TOPROL-XL) 24 hr tablet 25 mg  25 mg Oral Daily Dana Allan I, MD   25 mg at 10/15/20 0934  . ondansetron (ZOFRAN) injection 4 mg  4 mg Intravenous Q6H PRN Icard, Bradley L, DO   4 mg at 10/14/20 2300  . pantoprazole (PROTONIX) EC tablet 40 mg  40 mg Oral Q1200 Ollis, Brandi L, NP   40 mg at 10/15/20 1209  . phenylephrine-shark liver oil-mineral oil-petrolatum (PREPARATION H) rectal ointment 1 application  1 application Rectal BID PRN  Icard, Bradley L, DO      . piperacillin-tazobactam (ZOSYN) IVPB 3.375 g  3.375 g Intravenous Q8H Wilson Singer I, RPH 12.5 mL/hr at 10/15/20 1346 3.375 g at 10/15/20 1346  . polyethylene glycol (MIRALAX / GLYCOLAX) packet 17 g  17 g Oral Daily PRN Donita Brooks, NP       Facility-Administered Medications Ordered in Other Encounters  Medication Dose Route Frequency Provider Last Rate Last Admin  . regadenoson (LEXISCAN) injection SOLN 0.4 mg  0.4 mg Intravenous Once Croitoru, Mihai, MD         Discharge Medications: Please see discharge summary for a list of discharge medications.  Relevant Imaging Results:  Relevant Lab Results:   Additional Information SSN: [id number]. Pt is not vaccinated for covid.  Joanne Chars, LCSW

## 2020-10-15 NOTE — Progress Notes (Signed)
Assumed care of patient. Report received from 2 Heart Nurse. Patient is alert and oriented working with PT at this time. Denies any needs at this time. Will reassess patient. Patient is sitting up in bedside recliner.

## 2020-10-15 NOTE — Progress Notes (Signed)
Physical Therapy Treatment Patient Details Name: [name] MRN: [id number] DOB: Nov 18, 1934 Today's Date: 10/15/2020    History of Present Illness Pt is a 86yo female who was re-admitted on 5/27 for respiratory distress and hypoxemia from CIR.  She was found to have bil. pulmonary edema. PMH: pt with recent CVA on R side of pons and inferior cerebellar vermis, A-fib on eliquis, DM, CHF, CKD stage III, CHF    PT Comments    Continuing work on functional mobility and activity tolerance;  Co-session with OT focused on functional OOB to chair transfers; Notable improvement in bed mobility, with pt performing the bulk of the task, only needing min assist to scoot hips further toward EOB; Still with lots of difficulty coming ot stand, needed 3 person max assist to stand in stedy; placed Maximove pad in the recliner for return to bed  Follow Up Recommendations  SNF     Equipment Recommendations   (sliding board, drop arms on w/c and 3 in 1; further assessment at post-acute rehab)    Recommendations for Other Services       Precautions / Restrictions Precautions Precautions: Fall Precaution Comments: L knee pain, and decr flexion ROM Restrictions Weight Bearing Restrictions: No    Mobility  Bed Mobility Overal bed mobility: Needs Assistance Bed Mobility: Supine to Sit     Supine to sit: Min assist;HOB elevated     General bed mobility comments: HOB elevated 45* cues for un weighting RLE and assist for RUE to hand rail; scooting to EOB    Transfers Overall transfer level: Needs assistance Equipment used: Ambulation equipment used Transfers: Sit to/from [employer] Transfers Sit to Stand: Max assist;+2 safety/equipment;+2 physical assistance;From elevated surface Stand pivot transfers: Max assist;+2 physical assistance;+2 safety/equipment;From elevated surface       General transfer comment: Pt maxA +3 for sit to stand x2 times from bed and from stedy; Decr L knee  flexion ROM makes it more difficult to get optimal foot placement on the L; LLE in more extended in front position, making it harder to translate center of mass over feet  Ambulation/Gait                 Stairs             Wheelchair Mobility    Modified Rankin (Stroke Patients Only) Modified Rankin (Stroke Patients Only) Pre-Morbid Rankin Score: Moderate disability Modified Rankin: Severe disability     Balance Overall balance assessment: Needs assistance Sitting-balance support: Single extremity supported;Feet supported Sitting balance-Leahy Scale: Good Sitting balance - Comments: BUE, shoulder shrugs in sitting EOB, cues to look up   Standing balance support: Bilateral upper extremity supported Standing balance-Leahy Scale: Zero Standing balance comment: use of stedy with maxA +3 (side to side and 1 in front) heavy assist to pull to stand                            Cognition Arousal/Alertness: Awake/alert Behavior During Therapy: WFL for tasks assessed/performed Overall Cognitive Status: No family/caregiver present to determine baseline cognitive functioning                                 General Comments: Pt requiring increased time for answers and slow to respond. A/O x4      Exercises Other Exercises Other Exercises: shoulder shrugs; shoulder rolls x5    General Comments  General comments (skin integrity, edema, etc.): BP: 156/68, 97% O2 on 2L and HR 70 BPM with exertion at EOB      Pertinent Vitals/Pain Pain Assessment: Faces Faces Pain Scale: Hurts a little bit Pain Location: L knee pain; some of the pain from pressure of lower leg on teh bottom aspet of the knee plate of stedy Pain Descriptors / Indicators: Grimacing;Discomfort Pain Intervention(s): Monitored during session;Repositioned    Home Living                      Prior Function            PT Goals (current goals can now be found in the care plan  section) Acute Rehab PT Goals Patient Stated Goal: to get stronger and go home PT Goal Formulation: With patient/family Time For Goal Achievement: 10/27/20 Potential to Achieve Goals: Good Progress towards PT goals: Progressing toward goals (slowly)    Frequency    Min 2X/week      PT Plan Discharge plan needs to be updated;Frequency needs to be updated    Co-evaluation PT/OT/SLP Co-Evaluation/Treatment: Yes Reason for Co-Treatment: Complexity of the patient's impairments (multi-system involvement);For patient/therapist safety;To address functional/ADL transfers PT goals addressed during session: Mobility/safety with mobility OT goals addressed during session: ADL's and self-care      AM-PAC PT "6 Clicks" Mobility   Outcome Measure  Help needed turning from your back to your side while in a flat bed without using bedrails?: A Little Help needed moving from lying on your back to sitting on the side of a flat bed without using bedrails?: A Little Help needed moving to and from a bed to a chair (including a wheelchair)?: Total Help needed standing up from a chair using your arms (e.g., wheelchair or bedside chair)?: Total Help needed to walk in hospital room?: Total Help needed climbing 3-5 steps with a railing? : Total 6 Click Score: 10    End of Session Equipment Utilized During Treatment: Gait belt Activity Tolerance: Patient tolerated treatment well Patient left: in chair;with call bell/phone within reach;with chair alarm set Nurse Communication: Mobility status;Other (comment) (Maximove pad placed under pt for back to bed) PT Visit Diagnosis: Other abnormalities of gait and mobility (R26.89);Muscle weakness (generalized) (M62.81);Difficulty in walking, not elsewhere classified (R26.2)     Time: [id number] PT Time Calculation (min) (ACUTE ONLY): 40 min  Charges:  $Therapeutic Activity: 8-22 mins                     Roney Marion, PT  Acute Rehabilitation  Services Pager [phone] Office [place] 10/15/2020, 1:46 PM

## 2020-10-15 NOTE — Progress Notes (Signed)
PROGRESS NOTE    [name]  [id number] DOB: [birth year] DOA: 10/11/2020 PCP: [name], MD  Outpatient Specialists:   Brief Narrative:  Patient is an 86 year old African-American female with past medical history significant for stroke with right-sided weakness, chronic atrial fibrillation on Eliquis, diabetes, chronic diastolic heart failure, CKD stage IV.  Patient initially presented to the hospital on May 10 with acute onset headache and slurred speech.  Patient had MRI which redemonstrated acute intraparenchymal hemorrhage on the right side of the pons and inferior cerebellar vermis.  She also had a small subarachnoid bleed.  Patient had blood pressure control with Cleviprex, she was on Eliquis and did receive andexanet alfa for reversal.  Ultimately she had a decreased functional ability and decision was made for admission to the rehab facility.  Patient's hospital course during inpatient rehab included PT, SLP and OT.  Rate controlled A. fib. and Eliquis remained on hold.  Blood pressure was treated with Norvasc, Imdur and hydralazine plus clonidine.  Lasix for diastolic heart failure and CKD IV.  On the morning of 10/11/2020 patient was found to be in respiratory distress and hypoxemic.  Pulmonary critical care was consulted for evaluation.  Patient had a chest x-ray that revealed bilateral airspace disease concerning for pneumonia versus pulmonary edema.  Decision was made for transfer to the intensive care unit after being placed on BiPAP for support  On 10/14/2020, Hospitalist team assumed care of patient.  IV Zosyn and IV Lasix 60 Mg twice daily were continued.  Patient has continued to improve.  Will change Lasix to 40 Mg p.o. once daily today, 10/15/2020.  Patient has improved significantly.  Patient is stable for discharge to a skilled nursing facility.  Patient can be discharged on Augmentin to complete course of antibiotics if needed.  Assessment & Plan:   Active Problems:    Acute respiratory distress  Acute hypoxemic respiratory failure requiring BiPAP/Acute bilateral pulmonary edema/chest x-ray with bilateral infiltrates: -Respiratory failure has improved significantly. -Patient is on 2 L/min of supplemental oxygen via nasal cannula. -Patient remains on IV Zosyn 3.375 g every 8 hourly.   -Leukocytosis continues downward trend. -IV Lasix 60 Mg twice daily will be changed to Lasix 40 Mg p.o. once daily. -Continue to monitor I's and O's, renal function and electrolytes.  -Patient is stable for discharge to a skilled nursing facility.  Acute on chronic diastolic heart failure: -Patient is on diuretics and beta-blocker. -Echocardiogram done on 09/25/2020 revealed grade 2 diastolic dysfunction, with normal left ventricular ejection fraction. -Continue to monitor renal function, electrolytes, I's and O's.  CKD 3b/4: -Likely secondary to combined diabetes mellitus and hypertension. -Follow-up with nephrology team on discharge. -Continue to monitor renal function, I's and O's and electrolytes.   Hypertension: -We will continue to optimize. -We will add metoprolol. -Goal blood pressure should be equal to or less than 130/80 mmHg.  Type 2 diabetes: -Continue subcutaneous Levemir 22 units twice daily. -Continue sliding scale insulin coverage. -Continue to optimize blood sugar.  Right pons intraparenchymal hemorrhage: -Patient was on Eliquis for atrial fibrillation. -History of CVA with residual right-sided weakness -Consider repeat CT head if nausea persist. -Continue to hold Eliquis.  Gastroesophageal reflux -Continue Protonix   DVT prophylaxis: SCD. Code Status: Full code Family Communication:  Disposition Plan: Skilled nursing facility.    Consultants:   Transferred from the ICU team.  CIR  Procedures:   None  Antimicrobials:   IV Zosyn   Subjective: No shortness of breath. No chest pain.  No fever or chills. No productive  cough.  Objective: Vitals:   10/15/20 0626 10/15/20 0745 10/15/20 0930 10/15/20 1413  BP: (!) 187/60 (!) 154/62 (!) 178/62 (!) 135/57  Pulse: 70 63  65  Resp: 16   18  Temp:    98.4 F (36.9 C)  TempSrc:    Oral  SpO2:  99%  97%  Weight:        Intake/Output Summary (Last 24 hours) at 10/15/2020 1630 Last data filed at 10/15/2020 1100 Gross per 24 hour  Intake 510.92 ml  Output 3370 ml  Net -2859.08 ml   Filed Weights   10/12/20 0404 10/13/20 0600 10/15/20 0500  Weight: 103.5 kg 100.5 kg 103 kg    Examination:  General exam: Appears calm and comfortable.  Patient is pale. Respiratory system: Clear to auscultation, however, patient has decreased air entry globally. Cardiovascular system: S1 & S2 heard Gastrointestinal system: Abdomen is nondistended, soft and nontender. No organomegaly or masses felt. Normal bowel sounds heard. Central nervous system: Alert and oriented.  Moves all extremities.   Extremities: Mild leg edema, but continues to improve.  Data Reviewed: I have personally reviewed following labs and imaging studies  CBC: Recent Labs  Lab 10/09/20 1008 10/11/20 0812 10/12/20 0051 10/14/20 0119 10/15/20 0139  WBC 10.6* 19.0* 12.4* 10.6* 10.0  NEUTROABS 8.7*  --   --   --   --   HGB 10.5* 11.3* 10.5* 10.0* 10.1*  HCT 31.6* 34.8* 32.5* 30.6* 31.3*  MCV 87.5 88.8 87.8 88.2 89.2  PLT 260 287 288 245 [id number]   Basic Metabolic Panel: Recent Labs  Lab 10/09/20 1008 10/11/20 0812 10/12/20 0051 10/14/20 0119 10/15/20 0139  NA 133* 132* 135 135 137  K 5.0 4.3 4.2 3.4* 3.2*  CL 106 102 106 99 96*  CO2 20* 18* 21* 27 33*  GLUCOSE 198* 264* 161* 228* 198*  BUN 36* 35* 37* 29* 26*  CREATININE 1.60* 1.86* 1.82* 1.99* 1.87*  CALCIUM 8.6* 8.4* 8.4* 8.2* 8.6*  MG  --   --  2.2  --   --   PHOS  --   --  4.6  --   --    GFR: Estimated Creatinine Clearance: 26.2 mL/min (A) (by C-G formula based on SCr of 1.87 mg/dL (H)). Liver Function Tests: Recent Labs  Lab  10/09/20 1008 10/11/20 0812  AST 17 19  ALT 21 23  ALKPHOS 47 59  BILITOT 0.6 0.7  PROT 6.8 7.1  ALBUMIN 3.0* 3.1*   No results for input(s): LIPASE, AMYLASE in the last 168 hours. No results for input(s): AMMONIA in the last 168 hours. Coagulation Profile: No results for input(s): INR, PROTIME in the last 168 hours. Cardiac Enzymes: No results for input(s): CKTOTAL, CKMB, CKMBINDEX, TROPONINI in the last 168 hours. BNP (last 3 results) No results for input(s): PROBNP in the last 8760 hours. HbA1C: No results for input(s): HGBA1C in the last 72 hours. CBG: Recent Labs  Lab 10/14/20 2144 10/15/20 0025 10/15/20 0415 10/15/20 1142 10/15/20 1159  GLUCAP 161* 200* 187* 169* 187*   Lipid Profile: No results for input(s): CHOL, HDL, LDLCALC, TRIG, CHOLHDL, LDLDIRECT in the last 72 hours. Thyroid Function Tests: No results for input(s): TSH, T4TOTAL, FREET4, T3FREE, THYROIDAB in the last 72 hours. Anemia Panel: No results for input(s): VITAMINB12, FOLATE, FERRITIN, TIBC, IRON, RETICCTPCT in the last 72 hours. Urine analysis:    Component Value Date/Time   COLORURINE YELLOW 09/25/2020 0806   APPEARANCEUR HAZY (  A) 09/25/2020 0806   LABSPEC 1.024 09/25/2020 0806   PHURINE 5.0 09/25/2020 0806   GLUCOSEU >=500 (A) 09/25/2020 0806   HGBUR NEGATIVE 09/25/2020 0806   BILIRUBINUR NEGATIVE 09/25/2020 0806   KETONESUR NEGATIVE 09/25/2020 0806   PROTEINUR 100 (A) 09/25/2020 0806   UROBILINOGEN 0.2 02/20/2015 1200   NITRITE NEGATIVE 09/25/2020 0806   LEUKOCYTESUR NEGATIVE 09/25/2020 0806   Sepsis Labs: '@LABRCNTIP'$ (procalcitonin:4,lacticidven:4)  )No results found for this or any previous visit (from the past 240 hour(s)).       Radiology Studies: No results found.      Scheduled Meds: . amLODipine  10 mg Oral Daily  . Chlorhexidine Gluconate Cloth  6 each Topical Daily  . DULoxetine  30 mg Oral Daily  . furosemide  60 mg Intravenous BID  . hydrALAZINE  100 mg Oral  Q8H  . hydrocortisone-pramoxine  1 applicator Rectal BID  . insulin aspart  0-15 Units Subcutaneous Q4H  . insulin detemir  22 Units Subcutaneous BID  . isosorbide mononitrate  60 mg Oral Daily  . metoprolol succinate  25 mg Oral Daily  . pantoprazole  40 mg Oral Q1200   Continuous Infusions: . piperacillin-tazobactam (ZOSYN)  IV 3.375 g (10/15/20 1346)     LOS: 4 days    Time spent: 25 minutes    Dana Allan, MD  [facility] Pager #: [phone] 7PM-7AM contact night coverage as above

## 2020-10-15 NOTE — TOC Initial Note (Signed)
Transition of Care [facility]) - Initial/Assessment Note    Patient Details  Name: [name] MRN: [id number] Date of Birth: 1934/09/20  Transition of Care [facility]) CM/SW Contact:    Joanne Chars, LCSW Phone Number: 10/15/2020, 4:02 PM  Clinical Narrative: CSW met with pt regarding her desire for SNF, not CIR.  Pt confirms this, choice document given.  Pt would like SNFs in [facility], states her daugher works at [employer], would be interested in going there. Permission given to speak with daughter Aniceto Boss, son Sonia Side.  Pt is not vaccinated for covid.  Current equipment in home: walker, shower chair.  PCP in place.    CSW spoke with daughter Aniceto Boss.  She works at [employer] in [place], would be interested in pt coming to that facility.                     Expected Discharge Plan: Skilled Nursing Facility Barriers to Discharge: Continued Medical Work up,SNF Pending bed offer   Patient Goals and CMS Choice Patient states their goals for this hospitalization and ongoing recovery are:: "get my legs stronger" CMS Medicare.gov Compare Post Acute Care list provided to:: Patient Choice offered to / list presented to : Patient  Expected Discharge Plan and Services Expected Discharge Plan: [place] Choice: [place] arrangements for the past 2 months: Single Family Home                                      Prior Living Arrangements/Services Living arrangements for the past 2 months: Single Family Home Lives with:: Adult Children (son Sonia Side) Patient language and need for interpreter reviewed:: Yes Do you feel safe going back to the place where you live?: Yes      Need for Family Participation in Patient Care: Yes (Comment) Care giver support system in place?: Yes (comment) Current home services: Other (comment) (none) Criminal Activity/Legal Involvement Pertinent to Current Situation/Hospitalization: No - Comment as  needed  Activities of Daily Living Home Assistive Devices/Equipment: Walker (specify type) ADL Screening (condition at time of admission) Patient's cognitive ability adequate to safely complete daily activities?: Yes Is the patient deaf or have difficulty hearing?: No Does the patient have difficulty seeing, even when wearing glasses/contacts?: No Does the patient have difficulty concentrating, remembering, or making decisions?: No Patient able to express need for assistance with ADLs?: Yes Does the patient have difficulty dressing or bathing?: Yes Independently performs ADLs?: No Does the patient have difficulty walking or climbing stairs?: Yes Weakness of Legs: Both Weakness of Arms/Hands: Both  Permission Sought/Granted Permission sought to share information with : Family Chief Financial Officer Permission granted to share information with : Yes, Verbal Permission Granted  Share Information with NAME: daughter Aniceto Boss, son Sonia Side  Permission granted to share info w AGENCY: SNF        Emotional Assessment Appearance:: Appears stated age Attitude/Demeanor/Rapport: Engaged Affect (typically observed): Appropriate,Pleasant Orientation: : Oriented to Self,Oriented to Place,Oriented to  Time,Oriented to Situation Alcohol / Substance Use: Not Applicable Psych Involvement: No (comment)  Admission diagnosis:  Acute respiratory distress [R06.03] Patient Active Problem List   Diagnosis Date Noted  . Acute respiratory distress 10/11/2020  . Sinus tachycardia   . Respiratory distress   . Uncontrolled type 2 diabetes mellitus with hyperglycemia ([place])   . Controlled type 2 diabetes mellitus with  hyperglycemia, with long-term current use of insulin ([place])   . Intraparenchymal hemorrhage of brain ([place]) 09/30/2020  . ICH (intracerebral hemorrhage) ([place]) 09/24/2020  . Pneumonia due to COVID-19 virus 02/12/2020  . Acute hypoxemic respiratory failure due to COVID-19 ([place])  02/08/2020  . Educated about COVID-19 virus infection 01/11/2020  . Acute urinary retention   . Acute pulmonary edema (HCC)   . AKI (acute kidney injury) ([place]) 07/25/2019  . Acute respiratory failure with hypercapnia ([place]) 07/24/2019  . Pancreatitis, acute 07/23/2019  . Diabetes mellitus type 2 with complications ([place]) 63/33/5456  . Atrial fibrillation, chronic ([place]) 07/23/2019  . Chronic diastolic CHF (congestive heart failure) (HCC)/EF > 60 % 07/23/2019  . Shortness of breath 06/19/2019  . Acute diastolic CHF (congestive heart failure) ([place]) 07/08/2017  . Acute respiratory failure with hypoxemia ([place]) 07/08/2017  . Uncontrolled type 2 diabetes mellitus with diabetic nephropathy, with long-term current use of insulin ([place]) 07/08/2017  . CKD (chronic kidney disease), stage IIIb 07/08/2017  . Essential hypertension 07/08/2017  . Hypertension 06/23/2017  . Prolapsed internal hemorrhoids, grade 3 06/25/2016  . Abdominal pain 05/08/2015  . Dysphagia, pharyngoesophageal phase   . Other hemorrhoids   . Diverticulosis of colon without hemorrhage   . GERD (gastroesophageal reflux disease) 02/28/2014  . Constipation 02/28/2014  . Esophageal dysphagia 02/28/2014  . Rectal bleeding 02/28/2014  . OSTEOARTHRITIS, KNEE, RIGHT, SEVERE 12/13/2008  . DEGENERATIVE DISC DISEASE, LUMBOSACRAL SPINE W/RADICULOPATHY 12/13/2008  . BACK PAIN 12/13/2008  . DIABETES 04/13/2007  . FRACTURE, FEMUR, INTERTROCHANTERIC REGION 04/13/2007   PCP:  [name], MD Pharmacy:  No Pharmacies Listed    Social Determinants of Health (SDOH) Interventions    Readmission Risk Interventions Readmission Risk Prevention Plan 02/09/2020 07/27/2019  Transportation Screening Complete Complete  Medication Review (RN CM) - Complete  HRI or Home Care Consult Complete -  Social Work Consult for [place] Planning/Counseling Complete -  Palliative Care Screening Not Applicable -  Medication Review Press photographer)  Complete -  Some recent data might be hidden

## 2020-10-15 NOTE — Progress Notes (Signed)
Pharmacy Antibiotic Note  [name] is a 86 y.o. female admitted on 10/11/2020 with pneumonia.  Pharmacy has been consulted for Zosyn dosing.  ID: asp PNA, afeb, wbc 10 down, Scr 1.87 stable. No cultures.  5/27 zosyn>   Plan: Zosyn 3.375g IV q8h (4 hour infusion). Dose ok down to a CrCl of 20. Pharmacy will sign off. Please reconsult for further dosing assitance.    Weight: 103 kg (227 lb 1.2 oz)  Temp (24hrs), Avg:98.8 F (37.1 C), Min:98.5 F (36.9 C), Max:99 F (37.2 C)  Recent Labs  Lab 10/09/20 1008 10/11/20 0812 10/12/20 0051 10/14/20 0119 10/15/20 0139  WBC 10.6* 19.0* 12.4* 10.6* 10.0  CREATININE 1.60* 1.86* 1.82* 1.99* 1.87*    Estimated Creatinine Clearance: 26.2 mL/min (A) (by C-G formula based on SCr of 1.87 mg/dL (H)).    No Known Allergies   Shaya Altamura S. Alford Highland, PharmD, BCPS Clinical Staff Pharmacist [email or web address] [facility] 10/15/2020 10:43 AM

## 2020-10-15 NOTE — Progress Notes (Signed)
Inpatient Rehab Admissions Coordinator:   Spoke with CIR social worker, Erlene Quan, who states she spoke to daughter on return to acute and Aniceto Boss would like to pursue SNF for longer term rehab.  I will sign off for CIR and let TOC team know.    Shann Medal, PT, DPT Admissions Coordinator [phone] 10/15/20  11:08 AM

## 2020-10-15 NOTE — Progress Notes (Signed)
Occupational Therapy Treatment Patient Details Name: [name] MRN: [id number] DOB: [birth year] Today's Date: 10/15/2020    History of present illness Pt is a 86yo female who was re-admitted on 5/27 for respiratory distress and hypoxemia from CIR.  She was found to have bil. pulmonary edema. 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 Pt's session's focus on m obility to practive transfers for activity tolerance. Pt's family now wanting to pursue SNF and that appears appropriate as pt requiring heavy +3 assist to recliner with stedy x2 standing.  Pt continues to have weakness on R side and L knee pain. Pt would greatly benefit from continued OT skilled services. OT following acutely.  BP: 156/68, 97% O2 on 2L and HR 70 BPM with exertion at EOB   Follow Up Recommendations  SNF (family has agreed to [facility])    Equipment Recommendations  None recommended by OT    Recommendations for Other Services      Precautions / Restrictions Precautions Precautions: Fall Precaution Comments: L knee pain       Mobility Bed Mobility Overal bed mobility: Needs Assistance Bed Mobility: Supine to Sit     Supine to sit: Min assist;HOB elevated     General bed mobility comments: HOB elevated 45* cues for un weighting RLE and assist for RUE to hand rail; scooting to EOB    Transfers Overall transfer level: Needs assistance Equipment used: Ambulation equipment used Transfers: Sit to/from [facility] Sit to Stand: Max assist;+2 safety/equipment;+2 physical assistance;From elevated surface Stand pivot transfers: Max assist;+2 physical assistance;+2 safety/equipment;From elevated surface       General transfer comment: Pt maxA +3 for sit to stand x2 times from bed and from stedy.    Balance Overall balance assessment: Needs  assistance Sitting-balance support: Single extremity supported;Feet supported Sitting balance-Leahy Scale: Good Sitting balance - Comments: BUE, shoulder shrugs in sitting EOB, cues to look up   Standing balance support: Bilateral upper extremity supported Standing balance-Leahy Scale: Zero Standing balance comment: use of stedy with maxA +3 (side to side and 1 in front) heavy assist to pull to stand                           ADL either performed or assessed with clinical judgement   ADL Overall ADL's : Needs assistance/impaired                         Toilet Transfer: Maximal assistance;+2 for physical assistance;+2 for safety/equipment;Cueing for safety;Cueing for sequencing Toilet Transfer Details (indicate cue type and reason): maxA+3 (1 OT/PT on either side and RN in front for sit to stand from elevated bed).         Functional mobility during ADLs: Maximal assistance;+2 for physical assistance;+2 for safety/equipment (MinA for bed mobility with HOB raised and cues for technique.) General ADL Comments: session's focus on m obility to practive transfers for activity tolerance. Pt's family now wanting to pursue SNF and that appears appropriate as pt requiring heavy +3 assist to recliner with stedy. BP: 156/68, 97% O2 on 2L and HR 70 BPM with exertion at EOB     Vision   Vision Assessment?: Vision impaired- to be further tested in functional context Eye Alignment: Within Functional Limits  Ocular Range of Motion: Within Functional Limits Alignment/Gaze Preference: Within Defined Limits Additional Comments: continue to assess   Perception     Praxis      Cognition Arousal/Alertness: Awake/alert Behavior During Therapy: WFL for tasks assessed/performed Overall Cognitive Status: No family/caregiver present to determine baseline cognitive functioning                                 General Comments: Pt requiring increased time for answers and  slow to respond. A/O x4        Exercises Other Exercises Other Exercises: shoulder shrugs; shoulder rolls x5   Shoulder Instructions       General Comments BP: 156/68, 97% O2 on 2L and HR 70 BPM with exertion at EOB    Pertinent Vitals/ Pain       Pain Assessment: Faces Faces Pain Scale: Hurts a little bit Pain Location: L knee pain Pain Descriptors / Indicators: Discomfort;Sore Pain Intervention(s): Monitored during session;Repositioned  Home Living                                          Prior Functioning/Environment              Frequency  Min 2X/week        Progress Toward Goals  OT Goals(current goals can now be found in the care plan section)  Progress towards OT goals: Progressing toward goals  Acute Rehab OT Goals Patient Stated Goal: to get stronger and go home OT Goal Formulation: With patient Time For Goal Achievement: 10/27/20 Potential to Achieve Goals: Good ADL Goals Pt Will Perform Grooming: with min guard assist;standing Pt Will Perform Upper Body Bathing: with supervision;with set-up;sitting Pt Will Perform Lower Body Bathing: with mod assist;sitting/lateral leans;sit to/from stand Pt Will Perform Lower Body Dressing: with min assist;with adaptive equipment;sitting/lateral leans;sit to/from stand Pt Will Transfer to Toilet: with mod assist;with +2 assist;squat pivot transfer;bedside commode Pt/caregiver will Perform Home Exercise Program: Increased strength;Both right and left upper extremity;With minimal assist;With written HEP provided Additional ADL Goal #1: Pt will increase to minA for bed mobility as precursor for ADL Additional ADL Goal #2: Pt will perform OOB ADL tasks with minA displaying good safety awareness.  Plan Discharge plan needs to be updated    Co-evaluation    PT/OT/SLP Co-Evaluation/Treatment: Yes Reason for Co-Treatment: Complexity of the patient's impairments (multi-system involvement);To address  functional/ADL transfers   OT goals addressed during session: ADL's and self-care      AM-PAC OT "6 Clicks" Daily Activity     Outcome Measure   Help from another person eating meals?: A Little Help from another person taking care of personal grooming?: A Little Help from another person toileting, which includes using toliet, bedpan, or urinal?: A Lot Help from another person bathing (including washing, rinsing, drying)?: A Lot Help from another person to put on and taking off regular upper body clothing?: A Lot Help from another person to put on and taking off regular lower body clothing?: Total 6 Click Score: 13    End of Session Equipment Utilized During Treatment: Gait belt  OT Visit Diagnosis: Unsteadiness on feet (R26.81);Muscle weakness (generalized) (M62.81);Pain Symptoms and signs involving cognitive functions: Cerebral infarction Pain - Right/Left: Left Pain - part of body: Knee   Activity Tolerance Patient limited by pain  Patient Left in chair;with call bell/phone within reach;Other (comment);with chair alarm set (lift pad placed under patient)   Nurse Communication Mobility status;Need for lift equipment (use of lift to get pt back to bed)        Time: [id number] OT Time Calculation (min): 40 min  Charges: OT General Charges $OT Visit: 1 Visit OT Treatments $Therapeutic Activity: 23-37 mins  Jefferey Pica, OTR/L Acute Rehabilitation Services Pager: [phone] Office: [phone]    Royer Cristobal C 10/15/2020, 11:55 AM

## 2020-10-16 DIAGNOSIS — J9601 Acute respiratory failure with hypoxia: Secondary | ICD-10-CM

## 2020-10-16 LAB — GLUCOSE, CAPILLARY
Glucose-Capillary: 122 mg/dL — ABNORMAL HIGH (ref 70–99)
Glucose-Capillary: 136 mg/dL — ABNORMAL HIGH (ref 70–99)
Glucose-Capillary: 173 mg/dL — ABNORMAL HIGH (ref 70–99)
Glucose-Capillary: 184 mg/dL — ABNORMAL HIGH (ref 70–99)
Glucose-Capillary: 205 mg/dL — ABNORMAL HIGH (ref 70–99)
Glucose-Capillary: 206 mg/dL — ABNORMAL HIGH (ref 70–99)
Glucose-Capillary: 74 mg/dL (ref 70–99)
Glucose-Capillary: 96 mg/dL (ref 70–99)

## 2020-10-16 LAB — CBC
HCT: 30.9 % — ABNORMAL LOW (ref 36.0–46.0)
Hemoglobin: 10.1 g/dL — ABNORMAL LOW (ref 12.0–15.0)
MCH: 28.7 pg (ref 26.0–34.0)
MCHC: 32.7 g/dL (ref 30.0–36.0)
MCV: 87.8 fL (ref 80.0–100.0)
Platelets: 247 10*3/uL (ref 150–400)
RBC: 3.52 MIL/uL — ABNORMAL LOW (ref 3.87–5.11)
RDW: 14.9 % (ref 11.5–15.5)
WBC: 10.1 10*3/uL (ref 4.0–10.5)
nRBC: 0 % (ref 0.0–0.2)

## 2020-10-16 LAB — BASIC METABOLIC PANEL
Anion gap: 11 (ref 5–15)
BUN: 34 mg/dL — ABNORMAL HIGH (ref 8–23)
CO2: 31 mmol/L (ref 22–32)
Calcium: 9 mg/dL (ref 8.9–10.3)
Chloride: 94 mmol/L — ABNORMAL LOW (ref 98–111)
Creatinine, Ser: 2.04 mg/dL — ABNORMAL HIGH (ref 0.44–1.00)
GFR, Estimated: 24 mL/min — ABNORMAL LOW (ref >60)
Glucose, Bld: 120 mg/dL — ABNORMAL HIGH (ref 70–99)
Potassium: 3.6 mmol/L (ref 3.5–5.1)
Sodium: 136 mmol/L (ref 135–145)

## 2020-10-16 LAB — MAGNESIUM: Magnesium: 2 mg/dL (ref 1.7–2.4)

## 2020-10-16 MED ORDER — INSULIN ASPART 100 UNIT/ML IJ SOLN
0.0000 [IU] | Freq: Three times a day (TID) | INTRAMUSCULAR | Status: DC
  Administered 2020-10-16: 2 [IU] via SUBCUTANEOUS
  Administered 2020-10-16: 5 [IU] via SUBCUTANEOUS
  Administered 2020-10-17: 6 [IU] via SUBCUTANEOUS

## 2020-10-16 MED ORDER — ALPRAZOLAM 0.5 MG PO TABS
0.5000 mg | ORAL_TABLET | Freq: Two times a day (BID) | ORAL | Status: DC | PRN
  Administered 2020-10-16: 0.5 mg via ORAL
  Filled 2020-10-16: qty 1

## 2020-10-16 MED ORDER — AMOXICILLIN-POT CLAVULANATE 875-125 MG PO TABS: 1.0000 | ORAL_TABLET | Freq: Two times a day (BID) | ORAL | Status: DC

## 2020-10-16 MED ORDER — FUROSEMIDE 40 MG PO TABS: 40.0000 mg | ORAL_TABLET | Freq: Every day | ORAL | 0 refills | Status: DC

## 2020-10-16 MED ORDER — METOPROLOL SUCCINATE ER 25 MG PO TB24: 25.0000 mg | ORAL_TABLET | Freq: Every day | ORAL | 0 refills | Status: DC

## 2020-10-16 MED ORDER — INSULIN DETEMIR 100 UNIT/ML ~~LOC~~ SOLN: 22.0000 [IU] | Freq: Two times a day (BID) | SUBCUTANEOUS | 0 refills | Status: DC

## 2020-10-16 NOTE — Care Management Important Message (Signed)
Important Message  Patient Details  Name: [name] MRN: [id number] Date of Birth: 04-03-1935   Medicare Important Message Given:  Yes     Javarus Dorner 10/16/2020, 3:05 PM

## 2020-10-16 NOTE — TOC Progression Note (Addendum)
Transition of Care [facility]) - Progression Note    Patient Details  Name: [name] MRN: [id number] Date of Birth: 01/31/1935  Transition of Care [facility]) CM/SW Contact  Joanne Chars, LCSW Phone Number: 10/16/2020, 9:57 AM  Clinical Narrative:   CSW met with pt, daughter [name] in room to present bed offers.  Pt accepts offer for [facility].  CSW informed them that pt could potentially transfer today.  Per [facility], they do not manage this [employer] policy for SNF auth.   1005: CSW spoke with [place] at [facility].  She does have an available bed, will initiate auth but doubtful it will be approved today.  She does not need covid test, as they test pts when they arrive.     Expected Discharge Plan: [place] Barriers to Discharge: Continued Medical Work up,SNF Pending bed offer  Expected Discharge Plan and Services Expected Discharge Plan: [place] Choice: [place] arrangements for the past 2 months: Single Family Home                                       Social Determinants of Health (SDOH) Interventions    Readmission Risk Interventions Readmission Risk Prevention Plan 02/09/2020 07/27/2019  Transportation Screening Complete Complete  Medication Review (RN CM) - Complete  HRI or Home Care Consult Complete -  Social Work Consult for [place] Planning/Counseling Complete -  Palliative Care Screening Not Applicable -  Medication Review Press photographer) Complete -  Some recent data might be hidden

## 2020-10-16 NOTE — Discharge Summary (Addendum)
Physician Discharge Summary  [name] [id number] DOB: 1934-09-12 DOA: 10/11/2020  PCP: [name], MD  Admit date: 10/11/2020 Discharge date: 10/17/2020  Admitted From: CIR Disposition:  SNF   Recommendations for Outpatient Follow-up:  1. Follow up with PCP in 1 week 2. Follow up with Neurology Dr. Leonie Man in 2 weeks  Discharge Condition: Stable CODE STATUS: Full  Diet recommendation:  Diet Orders (From admission, onward)    Start     Ordered   10/14/20 1037  DIET - DYS 1 Room service appropriate? Yes; Fluid consistency: Thin  Diet effective now       Question Answer Comment  Room service appropriate? Yes   Fluid consistency: Thin      10/14/20 1036         Brief/Interim Summary: [name] is an 86 year old African-American female with past medical history significant for stroke with right-sided weakness, chronic atrial fibrillation on Eliquis, diabetes, chronic diastolic heart failure, CKD stage IV. Patient initially presented to the hospital on May 10 with acute onset headache and slurred speech. Patient had MRI which redemonstrated acute intraparenchymal hemorrhage on the right side of the pons and inferior cerebellar vermis. She also had a small subarachnoid bleed. Patient had blood pressure control with Cleviprex, she was on Eliquis and did receive andexanet alfa for reversal. Ultimately she had a decreased functional ability and decision was made for admission to the rehab facility.  Patient's hospital course during inpatient rehab included PT, SLP and OT. Rate controlled A. fib. andEliquis remained on hold. Blood pressure was treated with Norvasc, Imdur and hydralazine plus clonidine. Lasix for diastolic heart failure and CKD IV.  On the morning of 10/11/2020 patient was found to be in respiratory distress and hypoxemic.  Pulmonary critical care was consulted for evaluation. Patient had a chest x-ray that revealed bilateral airspace disease concerning for  pneumonia versus pulmonary edema. Decision was made for transfer to the intensive care unit after being placed on BiPAP for support  On 10/14/2020, Hospitalist team assumed care of patient.  IV Zosyn and IV Lasix 60 Mg twice daily were continued.  Patient has continued to improve.  Lasix changed to PO.  Patient weaned off O2 on 6/1 and remained stable for discharge to SNF.   Discharge Diagnoses:  Principal Problem:   Acute respiratory failure with hypoxemia (HCC) Active Problems:   GERD (gastroesophageal reflux disease)   Acute on chronic diastolic CHF (congestive heart failure) (HCC)   CKD (chronic kidney disease), stage IIIb   Diabetes mellitus type 2 with complications (HCC)   ICH (intracerebral hemorrhage) (HCC)   Acute respiratory distress   Acute hypoxemic respiratory failure requiring BiPAP/Acute bilateral pulmonary edema/chest x-ray with bilateral infiltrates: -Respiratory failure has improved significantly. Now on room air -Zosyn started for concern for aspiration 5/27 - 6/1   Acute on chronic diastolic heart failure: -Echocardiogram done on 09/25/2020 revealed grade 2 diastolic dysfunction, with normal left ventricular ejection fraction. -Continue to monitor renal function, electrolytes, I's and O's -Lasix, toprol   CKD 4: -Stable   Hypertension: -Continue toprol, norvasc, hydralazine   Type 2 diabetes: -Continue levemir, SSI   Right pons intraparenchymal hemorrhage: -Patient was on Eliquis for atrial fibrillation. Now stopped Eliquis.  -History of CVA with residual right-sided weakness  Gastroesophageal reflux -Continue Protonix   Discharge Instructions  Discharge Instructions    Increase activity slowly   Complete by: As directed      Allergies as of 10/16/2020   No Known Allergies  Medication List    STOP taking these medications   cloNIDine 0.1 MG tablet Commonly known as: CATAPRES   traMADol 50 MG tablet Commonly known as: ULTRAM      TAKE these medications   acetaminophen 325 MG tablet Commonly known as: TYLENOL Take 2 tablets (650 mg total) by mouth every 6 (six) hours as needed for mild pain, fever or headache (or Fever >/= 101).   amLODipine 10 MG tablet Commonly known as: NORVASC Take 1 tablet (10 mg total) by mouth daily.   diclofenac Sodium 1 % Gel Commonly known as: VOLTAREN Apply 2 g topically 4 (four) times daily.   DULoxetine 30 MG capsule Commonly known as: CYMBALTA Take 1 capsule (30 mg total) by mouth daily.   furosemide 40 MG tablet Commonly known as: LASIX Take 1 tablet (40 mg total) by mouth daily. Start taking on: October 17, 2020   hydrALAZINE 100 MG tablet Commonly known as: APRESOLINE Take 1 tablet (100 mg total) by mouth 3 (three) times daily.   hydrocortisone 2.5 % rectal cream Commonly known as: ANUSOL-HC Place 1 application rectally 2 (two) times daily as needed for hemorrhoids or anal itching.   insulin aspart 100 UNIT/ML FlexPen Commonly known as: NOVOLOG Inject 0-10 Units into the skin 3 (three) times daily with meals. insulin aspart (novoLOG) injection 0-10 Units 0-10 Units Subcutaneous, 3 times daily with meals CBG < 70: Implement Hypoglycemia Standing Orders and refer to Hypoglycemia Standing Orders sidebar report  CBG 70 - 120: 0 unit CBG 121 - 150: 0 unit  CBG 151 - 200: 1 unit CBG 201 - 250: 2 units CBG 251 - 300: 4 units CBG 301 - 350: 6 units  CBG 351 - 400: 8 units  CBG > 400: 10 units   insulin aspart 100 UNIT/ML injection Commonly known as: novoLOG Inject 5 Units into the skin 3 (three) times daily with meals.   insulin detemir 100 UNIT/ML injection Commonly known as: LEVEMIR Inject 0.22 mLs (22 Units total) into the skin 2 (two) times daily. What changed: how much to take   isosorbide mononitrate 60 MG 24 hr tablet Commonly known as: IMDUR Take 1 tablet (60 mg total) by mouth daily.   metoprolol succinate 25 MG 24 hr tablet Commonly known as: TOPROL-XL Take 1  tablet (25 mg total) by mouth daily. Start taking on: October 17, 2020   ondansetron 4 MG tablet Commonly known as: ZOFRAN Take 4 mg by mouth every 6 (six) hours as needed for nausea or vomiting.   pantoprazole 40 MG tablet Commonly known as: PROTONIX Take 1 tablet by mouth daily.       Follow-up Information    [name], MD. Schedule an appointment as soon as possible for a visit in 1 week(s).   Specialty: Internal Medicine Contact information: [place] [address] [phone]        Garvin Fila, MD. Schedule an appointment as soon as possible for a visit in 2 week(s).   Specialties: Neurology, Radiology Contact information: [address] [place] [address] [phone]              No Known Allergies  Consultations:  PCCM admit   Procedures/Studies: CT Angio Head W or Wo Contrast  Result Date: 09/24/2020 CLINICAL DATA:  Initial evaluation for acute intracranial hemorrhage. EXAM: CT ANGIOGRAPHY HEAD AND NECK TECHNIQUE: Multidetector CT imaging of the head and neck was performed using the standard protocol during bolus administration of  intravenous contrast. Multiplanar CT image reconstructions and MIPs were obtained to evaluate the vascular anatomy. Carotid stenosis measurements (when applicable) are obtained utilizing NASCET criteria, using the distal internal carotid diameter as the denominator. CONTRAST:  80m OMNIPAQUE IOHEXOL 350 MG/ML SOLN COMPARISON:  Prior head CT from earlier the same day. FINDINGS: CTA NECK FINDINGS Aortic arch: Visualized aortic arch normal in caliber. Origin of the great vessels incompletely assessed on this exam. Atheromatous change about the origin of the left subclavian artery without significant stenosis. Right carotid system: Right CCA patent from its origin to the bifurcation without stenosis. Scattered calcified plaque about the right carotid bulb without significant stenosis. Right ICA  patent distally without stenosis, dissection or occlusion. Left carotid system: Origin of the left CCA not visualized. Visualized left CCA patent to the bifurcation without stenosis. Eccentric calcified plaque at the left carotid bulb without significant stenosis. Left ICA patent distally without stenosis, dissection or occlusion. Vertebral arteries: Both vertebral arteries arise from subclavian arteries. Right vertebral artery dominant. Vertebral arteries patent within the neck without stenosis, dissection or occlusion. Skeleton: No visible acute osseous abnormality. No discrete or worrisome osseous lesions. 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 Other neck: No other visible acute soft tissue abnormality within the neck. Enlarged multinodular goiter, with dominant 2.6 cm right thyroid nodule. This has been evaluated on previous imaging in 2013. (ref: J Am Coll Radiol. 2015 Feb;12(2): 143-50). No other mass or adenopathy. Upper chest: Diffuse interlobular septal thickening seen within the visualized lungs, suggesting pulmonary interstitial edema. Visualized upper chest demonstrates no other acute finding. Review of the MIP images confirms the above findings CTA HEAD FINDINGS Anterior circulation: Petrous segments patent bilaterally. Extensive atheromatous change throughout the carotid siphons with associated moderate to advanced diffuse narrowing. A1 segments patent bilaterally. Normal anterior communicating artery complex. Anterior cerebral arteries patent to their distal aspects without stenosis. No M1 stenosis or occlusion. Normal MCA bifurcations. Distal MCA branches well perfused and symmetric. Posterior circulation: Atheromatous change within the mid V4 segments bilaterally with associated moderate stenoses. Left PICA origin patent and normal. Right PICA not seen. Basilar patent to its distal aspect without stenosis. Superior cerebellar arteries patent bilaterally. Left PCA supplied via the  basilar. Predominant fetal type origin of the right PCA. PCAs patent to their distal aspects without stenosis. Venous sinuses: Patent allowing for timing the contrast bolus. Anatomic variants: Predominant fetal type origin of the right PCA. No intracranial aneurysm. No vascular abnormality seen underlying the hemorrhages at the pons and cerebellum. Review of the MIP images confirms the above findings IMPRESSION: 1. Negative CTA for large vessel occlusion. No vascular abnormality seen underlying the hemorrhages at the pons and cerebellum. 2. Extensive atheromatous change throughout the carotid siphons with associated moderate to advanced diffuse narrowing. 3. Atheromatous change about the V4 segments bilaterally with associated moderate stenoses. 4. Diffuse interlobular septal thickening within the visualized lungs, suggesting pulmonary interstitial edema. Electronically Signed   By: BJeannine BogaM.D.   On: 09/24/2020 21:57   DG Chest 1 View  Result Date: 10/11/2020 CLINICAL DATA:  86year old with cough and respiratory distress. EXAM: CHEST  1 VIEW COMPARISON:  09/24/2020 FINDINGS: Single view of the chest demonstrates stable enlargement of the cardiac silhouette. There is new airspace disease in both lungs, left side greater than right. Again noted are prominent interstitial lung markings. Trachea is midline. Negative for pneumothorax. Bone structures are stable. IMPRESSION: New bilateral airspace disease, left side greater than right. Pattern is suggestive  for asymmetric pulmonary edema. Pneumonia is also in the differential diagnosis. Stable cardiomegaly. Electronically Signed   By: Markus Daft M.D.   On: 10/11/2020 07:34   DG Knee 1-2 Views Left  Result Date: 10/01/2020 CLINICAL DATA:  Chronic bilateral knee pain. EXAM: LEFT KNEE - 1-2 VIEW COMPARISON:  Right knee 10/01/2020 FINDINGS: Two views of the left knee demonstrate severe joint space loss in the medial and lateral knee compartments. Near  complete joint space loss in the medial knee compartment. Severe osteophytosis throughout the knee. Extensive degenerative changes at the patellofemoral compartment. Evidence for moderate sized suprapatellar joint effusion. Overall alignment of the knee is normal without a fracture or dislocation. IMPRESSION: Severe joint space loss and osteophytosis throughout the left knee. Findings are compatible with severe osteoarthritis with a joint effusion. No acute bone abnormality. Electronically Signed   By: Markus Daft M.D.   On: 10/01/2020 13:31   DG Knee 1-2 Views Right  Result Date: 10/01/2020 CLINICAL DATA:  Bilateral chronic knee pain. Pain with weight-bearing. EXAM: RIGHT KNEE - 1-2 VIEW COMPARISON:  None. FINDINGS: There is significant joint space narrowing and osteophyte formation involving the MEDIAL and patellofemoral compartments and to a lesser degree the LATERAL compartment. No acute fracture or joint effusion. Note is made of atherosclerotic calcification of popliteal artery. IMPRESSION: Significant degenerative changes. No evidence for acute  abnormality. Electronically Signed   By: Nolon Nations M.D.   On: 10/01/2020 13:30   DG Abd 1 View  Result Date: 10/09/2020 CLINICAL DATA:  Nausea, history chronic kidney disease, diabetes mellitus EXAM: ABDOMEN - 1 VIEW COMPARISON:  08/24/2014 FINDINGS: Tubal ligation clips in pelvis. Surgical clips RIGHT upper quadrant likely reflect prior cholecystectomy. Nonobstructive bowel gas pattern. Scattered gas within colon and small bowel. No bowel dilatation or bowel wall thickening. Osseous demineralization with multilevel degenerative changes of the lumbar spine. Orthopedic hardware and posttraumatic deformity proximal RIGHT femur. No urinary tract calcifications. IMPRESSION: Nonobstructive bowel gas pattern. Electronically Signed   By: Lavonia Dana M.D.   On: 10/09/2020 11:15   CT Head Wo Contrast  Addendum Date: 09/24/2020   ADDENDUM REPORT: 09/24/2020  20:39 ADDENDUM: The patient was subsequently sedated and return for repeat imaging which demonstrates evidence of a parenchymal hemorrhage in the right half of the pons with suggestion of local extension into the prepontine cistern. The density seen in the midline posteriorly may represent some dependent spread of hemorrhage although some artifact does remain. Critical Value/emergent results were called by telephone at the time of interpretation on 09/24/2020 at 8:39 pm to Dr. Varney Biles , who verbally acknowledged these results. Electronically Signed   By: Inez Catalina M.D.   On: 09/24/2020 20:39   Result Date: 09/24/2020 CLINICAL DATA:  Headaches, no known injury, initial encounter EXAM: CT HEAD WITHOUT CONTRAST TECHNIQUE: Contiguous axial images were obtained from the base of the skull through the vertex without intravenous contrast. COMPARISON:  05/02/2008 FINDINGS: Brain: Images are significantly limited by patient motion artifact. Mild atrophic changes and chronic white matter ischemic changes are seen. There are areas of increased attenuation anterior to the pons as well as posteriorly in the midline in the cerebellum. These are likely related to artifact although the possibility of underlying hemorrhage could not be totally excluded on this exam. Repeat imaging when the patient can better tolerate the exam is recommended. Vascular: No hyperdense vessel or unexpected calcification. Skull: Normal. Negative for fracture or focal lesion. Sinuses/Orbits: No acute finding. Other: None. IMPRESSION: Significantly limited exam.  There are findings suspicious for hemorrhage in the posterior fossa and given the clinical history repeat imaging with sedation is recommended to allow the patient to better tolerate the procedure. No other focal abnormality is noted. Electronically Signed: By: Inez Catalina M.D. On: 09/24/2020 19:55   CT Angio Neck W and/or Wo Contrast  Result Date: 09/24/2020 CLINICAL DATA:  Initial  evaluation for acute intracranial hemorrhage. EXAM: CT ANGIOGRAPHY HEAD AND NECK TECHNIQUE: Multidetector CT imaging of the head and neck was performed using the standard protocol during bolus administration of intravenous contrast. Multiplanar CT image reconstructions and MIPs were obtained to evaluate the vascular anatomy. Carotid stenosis measurements (when applicable) are obtained utilizing NASCET criteria, using the distal internal carotid diameter as the denominator. CONTRAST:  11m OMNIPAQUE IOHEXOL 350 MG/ML SOLN COMPARISON:  Prior head CT from earlier the same day. FINDINGS: CTA NECK FINDINGS Aortic arch: Visualized aortic arch normal in caliber. Origin of the great vessels incompletely assessed on this exam. Atheromatous change about the origin of the left subclavian artery without significant stenosis. Right carotid system: Right CCA patent from its origin to the bifurcation without stenosis. Scattered calcified plaque about the right carotid bulb without significant stenosis. Right ICA patent distally without stenosis, dissection or occlusion. Left carotid system: Origin of the left CCA not visualized. Visualized left CCA patent to the bifurcation without stenosis. Eccentric calcified plaque at the left carotid bulb without significant stenosis. Left ICA patent distally without stenosis, dissection or occlusion. Vertebral arteries: Both vertebral arteries arise from subclavian arteries. Right vertebral artery dominant. Vertebral arteries patent within the neck without stenosis, dissection or occlusion. Skeleton: No visible acute osseous abnormality. No discrete or worrisome osseous lesions. 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 Other neck: No other visible acute soft tissue abnormality within the neck. Enlarged multinodular goiter, with dominant 2.6 cm right thyroid nodule. This has been evaluated on previous imaging in 2013. (ref: J Am Coll Radiol. 2015 Feb;12(2): 143-50). No other  mass or adenopathy. Upper chest: Diffuse interlobular septal thickening seen within the visualized lungs, suggesting pulmonary interstitial edema. Visualized upper chest demonstrates no other acute finding. Review of the MIP images confirms the above findings CTA HEAD FINDINGS Anterior circulation: Petrous segments patent bilaterally. Extensive atheromatous change throughout the carotid siphons with associated moderate to advanced diffuse narrowing. A1 segments patent bilaterally. Normal anterior communicating artery complex. Anterior cerebral arteries patent to their distal aspects without stenosis. No M1 stenosis or occlusion. Normal MCA bifurcations. Distal MCA branches well perfused and symmetric. Posterior circulation: Atheromatous change within the mid V4 segments bilaterally with associated moderate stenoses. Left PICA origin patent and normal. Right PICA not seen. Basilar patent to its distal aspect without stenosis. Superior cerebellar arteries patent bilaterally. Left PCA supplied via the basilar. Predominant fetal type origin of the right PCA. PCAs patent to their distal aspects without stenosis. Venous sinuses: Patent allowing for timing the contrast bolus. Anatomic variants: Predominant fetal type origin of the right PCA. No intracranial aneurysm. No vascular abnormality seen underlying the hemorrhages at the pons and cerebellum. Review of the MIP images confirms the above findings IMPRESSION: 1. Negative CTA for large vessel occlusion. No vascular abnormality seen underlying the hemorrhages at the pons and cerebellum. 2. Extensive atheromatous change throughout the carotid siphons with associated moderate to advanced diffuse narrowing. 3. Atheromatous change about the V4 segments bilaterally with associated moderate stenoses. 4. Diffuse interlobular septal thickening within the visualized lungs, suggesting pulmonary interstitial edema. Electronically Signed  By: Jeannine Boga M.D.   On:  09/24/2020 21:57   MR BRAIN WO CONTRAST  Result Date: 09/25/2020 CLINICAL DATA:  Previous stroke. Chronic atrial fibrillation. Right hemiparesis. New onset headache. Intracranial hemorrhage. EXAM: MRI HEAD WITHOUT CONTRAST TECHNIQUE: Multiplanar, multiecho pulse sequences of the brain and surrounding structures were obtained without intravenous contrast. COMPARISON:  CT studies yesterday. FINDINGS: Brain: Acute intraparenchymal hemorrhage within the right side of the pons is redemonstrated without appreciable change. Acute hemorrhage in the inferior cerebellar vermis is redemonstrated without appreciable change. Chronic small-vessel ischemic changes are present elsewhere throughout the pons. Few other old small vessel cerebellar infarctions. Cerebral hemispheres show chronic small-vessel disease throughout the thalami and hemispheric white matter. Few old basal ganglia infarctions. No large vessel territory infarction. Numerous scattered punctate foci of hemosiderin deposition related to some of the old small vessel infarctions. Small amount of blood dependent within the occipital horns of the lateral ventricles and in the perimesencephalic cistern. No hydrocephalus. No sign of mass lesion. Vascular: Major vessels at the base of the brain show flow. Skull and upper cervical spine: Negative Sinuses/Orbits: Clear/normal Other: None IMPRESSION: Redemonstration of acute intraparenchymal hemorrhage within the right side of the pons and within the inferior cerebellar vermis. Small amount of subarachnoid blood in the perimesencephalic cistern, interfoliar subarachnoid spaces of the posterior fossa and dependent within the occipital horns of the lateral ventricles. No evidence of increasing or new hemorrhage. No hydrocephalus. Extensive chronic small-vessel ischemic changes elsewhere throughout the brain, many with punctate hemosiderin deposition. Overall findings most consistent with hypertensive disease.  Electronically Signed   By: Nelson Chimes M.D.   On: 09/25/2020 07:33   DG Chest Port 1 View  Result Date: 10/12/2020 CLINICAL DATA:  Acute respiratory distress, pulmonary edema EXAM: PORTABLE CHEST 1 VIEW COMPARISON:  10/11/2020 FINDINGS: Pulmonary vascular congestion. No frank interstitial edema, improved. Mild bibasilar atelectasis. No definite pleural effusions or pneumothorax. Cardiomegaly. IMPRESSION: Pulmonary vascular congestion. No frank interstitial edema, improved. Electronically Signed   By: Julian Hy M.D.   On: 10/12/2020 08:37   DG Chest Port 1 View  Result Date: 09/24/2020 CLINICAL DATA:  Shortness of breath cough EXAM: PORTABLE CHEST 1 VIEW COMPARISON:  02/10/2020 FINDINGS: Cardiac shadow remains enlarged. Mild vascular congestion is noted although improved when compared with the prior exam. No sizable effusion is noted. No pneumothorax is seen. No bony abnormality is noted. IMPRESSION: Mild CHF although improved from the prior study. Electronically Signed   By: Inez Catalina M.D.   On: 09/24/2020 18:49   DG Swallowing Func-Speech Pathology  Result Date: 10/13/2020 Objective Swallowing Evaluation: Type of Study: MBS-Modified Barium Swallow Study  Patient Details Name: DAVA EPPLER MRN: [id number] Date of Birth: 09/12/1934 Today's Date: 10/13/2020 Time: SLP Start Time (ACUTE ONLY): 1020 -SLP Stop Time (ACUTE ONLY): 1040 SLP Time Calculation (min) (ACUTE ONLY): 20 min Past Medical History: Past Medical History: Diagnosis Date . Arthritis  . CHF (congestive heart failure) ([place])  . Chronic diastolic heart failure ([place])  . CKD (chronic kidney disease) stage 3, GFR 30-59 ml/min (HCC)  . Essential hypertension  . GERD (gastroesophageal reflux disease)  . Hemorrhoids  . History of stroke  . Type 2 diabetes mellitus ([place])  Past Surgical History: Past Surgical History: Procedure Laterality Date . BACK SURGERY   . CATARACT EXTRACTION W/PHACO Left 02/28/2013  Procedure: CATARACT EXTRACTION PHACO  AND INTRAOCULAR LENS PLACEMENT (IOL) CDE=15.60;  Surgeon: Elta Guadeloupe T. Gershon Crane, MD;  Location: [facility];  Service: Ophthalmology;  Laterality: Left; .  CHOLECYSTECTOMY   . COLONOSCOPY  12/12/2003  RMR: Normal rectum.  Long capacious tortuous colon, colonic mucosa appeared normal . COLONOSCOPY N/A 03/21/2014  Dr. [name]: internal and external hemorrhoids, torturous colon, colonic diverticulosis  . ESOPHAGOGASTRODUODENOSCOPY  12/28/2001  [id number] sliding hiatal hernia/. Three small bulbar ulcers, two with stigmata of bleeding and these were coagulated using heater probe unit  . ESOPHAGOGASTRODUODENOSCOPY N/A 03/21/2014  Dr. [name]: cervical esophageal web s/p dilation, negative H.pylori  . EYE SURGERY   . HIP FRACTURE SURGERY  2008 . JOINT REPLACEMENT    Rt hip, ????? sounds like just for fracture . MALONEY DILATION N/A 03/21/2014  Procedure: [name] DILATION;  Surgeon: [name], MD;  [facility];  Service: Endoscopy;  Laterality: N/A; . SAVORY DILATION N/A 03/21/2014  Procedure: SAVORY DILATION;  Surgeon: [name], MD;  [facility]: [facility];  Service: Endoscopy;  Laterality: N/A; HPI: Pt is an 86 yo woman who initially presented to the hospital on May 10 with acute onset headache, slurred speech.  Patient had MRI which redemonstrated acute intraparenchymal hemorrhage on the right side of the pons and inferior cerebellar vermis.  She also had a small subarachnoid bleed. Pt transferred to CIR when stable.  Pt seen by ST in CIR with pt on Dys2/Thins and having trials of mechanical soft. On the morning of 10/11/2020 patient was found to be in respiratory distress and hypoxemic.  Pulmonary critical care was consulted for evaluation.  Patient had a chest x-ray that was completed this morning that revealed bilateral airspace disease concerning for pneumonia versus pulmonary edema.  Decision was made for transfer to the intensive care unit after being placed on BiPAP for support.  CXR 5/28: "Pulmonary vascular  congestion. No frank interstitial edema,  improved."  Subjective: Awake, alert, pleasant, participative Assessment / Plan / Recommendation CHL IP CLINICAL IMPRESSIONS 10/13/2020 Clinical Impression Pt demonstrates normal oral and oropharyngeal function. Pt is noted to have phonation with exhalation, which can sound like throat clearing at times, though no outright throat clearing occurred during testing. There were one or two instances of flash penetration. Otherwise, primary impariment appears esopahgeal; esophageal sweep shows significant stasis and pt had backflow of esophageal stasis to oropharynx. Question if this could have contributed to pts possible aspiration event. Pt should continue mech soft diet, follow solids with liquids, eat upright in chair and stay upright after meals. Meals should be eaten at a slow pace. Will f/u for further education on strategies. SLP Visit Diagnosis Dysphagia, pharyngoesophageal phase (R13.14) Attention and concentration deficit following -- Frontal lobe and executive function deficit following -- Impact on safety and function Moderate aspiration risk   CHL IP TREATMENT RECOMMENDATION 10/13/2020 Treatment Recommendations Therapy as outlined in treatment plan below   Prognosis 10/13/2020 Prognosis for Safe Diet Advancement Good Barriers to Reach Goals -- Barriers/Prognosis Comment -- CHL IP DIET RECOMMENDATION 10/13/2020 SLP Diet Recommendations Dysphagia 3 (Mech soft) solids;Thin liquid Liquid Administration via Cup;Straw Medication Administration Whole meds with puree Compensations Slow rate;Small sips/bites;Follow solids with liquid Postural Changes Seated upright at 90 degrees;Remain semi-upright after after feeds/meals (Comment)   CHL IP OTHER RECOMMENDATIONS 10/13/2020 Recommended Consults Consider esophageal assessment Oral Care Recommendations Oral care BID Other Recommendations --   CHL IP FOLLOW UP RECOMMENDATIONS 10/13/2020 Follow up Recommendations Inpatient Rehab   CHL  IP FREQUENCY AND DURATION 10/13/2020 Speech Therapy Frequency (ACUTE ONLY) min 2x/week Treatment Duration 2 weeks      CHL IP ORAL PHASE 10/13/2020 Oral Phase WFL Oral -  Pudding Teaspoon -- Oral - Pudding Cup -- Oral - Honey Teaspoon -- Oral - Honey Cup -- Oral - Nectar Teaspoon -- Oral - Nectar Cup -- Oral - Nectar Straw -- Oral - Thin Teaspoon -- Oral - Thin Cup -- Oral - Thin Straw -- Oral - Puree -- Oral - Mech Soft -- Oral - Regular -- Oral - Multi-Consistency -- Oral - Pill -- Oral Phase - Comment --  CHL IP PHARYNGEAL PHASE 10/13/2020 Pharyngeal Phase WFL Pharyngeal- Pudding Teaspoon -- Pharyngeal -- Pharyngeal- Pudding Cup -- Pharyngeal -- Pharyngeal- Honey Teaspoon -- Pharyngeal -- Pharyngeal- Honey Cup -- Pharyngeal -- Pharyngeal- Nectar Teaspoon -- Pharyngeal -- Pharyngeal- Nectar Cup -- Pharyngeal -- Pharyngeal- Nectar Straw -- Pharyngeal -- Pharyngeal- Thin Teaspoon -- Pharyngeal -- Pharyngeal- Thin Cup -- Pharyngeal -- Pharyngeal- Thin Straw -- Pharyngeal -- Pharyngeal- Puree -- Pharyngeal -- Pharyngeal- Mechanical Soft -- Pharyngeal -- Pharyngeal- Regular -- Pharyngeal -- Pharyngeal- Multi-consistency -- Pharyngeal -- Pharyngeal- Pill -- Pharyngeal -- Pharyngeal Comment --  CHL IP CERVICAL ESOPHAGEAL PHASE 10/13/2020 Cervical Esophageal Phase Impaired Pudding Teaspoon -- Pudding Cup -- Honey Teaspoon -- Honey Cup -- Nectar Teaspoon -- Nectar Cup -- Nectar Straw -- Thin Teaspoon -- Thin Cup WFL Thin Straw WFL Puree Other (Comment) Mechanical Soft -- Regular Esophageal backflow into the pharynx Multi-consistency -- Pill -- Cervical Esophageal Comment -- Herbie Baltimore, MA CCC-SLP Acute Rehabilitation Services Pager [phone] Office [phone] Lynann Beaver 10/13/2020, 2:15 PM              ECHOCARDIOGRAM COMPLETE  Result Date: 09/25/2020    ECHOCARDIOGRAM REPORT   Patient Name:   YAKELINE GRAJEWSKI Date of Exam: 09/25/2020 Medical Rec #:  [id number]    Height:       68.0 in Accession #:     [id number]   Weight:       200.0 lb Date of Birth:  03/21/1935     BSA:          2.044 m Patient Age:    86 years     BP:           118/43 mmHg Patient Gender: F            HR:           76 bpm. Exam Location:  Inpatient Procedure: 2D Echo Indications:    stroke  History:        Patient has prior history of Echocardiogram examinations, most                 recent 06/29/2019. CHF, Arrythmias:Atrial Fibrillation; Risk                 Factors:Diabetes and Hypertension.  Sonographer:    Johny Chess Referring Phys: [phone] [place]  Sonographer Comments: Image acquisition challenging due to patient body habitus. IMPRESSIONS  1. Left ventricular ejection fraction, by estimation, is 55 to 60%. The left ventricle has normal function. The left ventricle has no regional wall motion abnormalities. Left ventricular diastolic parameters are consistent with Grade II diastolic dysfunction (pseudonormalization). Elevated left atrial pressure.  2. Right ventricular systolic function is normal. The right ventricular size is normal.  3. The mitral valve is normal in structure. Trivial mitral valve regurgitation. No evidence of mitral stenosis.  4. The aortic valve has an indeterminant number of cusps. Aortic valve regurgitation is not visualized. No aortic stenosis is present.  5. The inferior vena cava is normal in size with greater than 50%  respiratory variability, suggesting right atrial pressure of 3 mmHg. FINDINGS  Left Ventricle: Left ventricular ejection fraction, by estimation, is 55 to 60%. The left ventricle has normal function. The left ventricle has no regional wall motion abnormalities. The left ventricular internal cavity size was normal in size. There is  no left ventricular hypertrophy. Left ventricular diastolic parameters are consistent with Grade II diastolic dysfunction (pseudonormalization). Elevated left atrial pressure. Right Ventricle: The right ventricular size is normal.Right ventricular  systolic function is normal. Left Atrium: Left atrial size was normal in size. Right Atrium: Right atrial size was normal in size. Pericardium: There is no evidence of pericardial effusion. Mitral Valve: The mitral valve is normal in structure. Mild mitral annular calcification. Trivial mitral valve regurgitation. No evidence of mitral valve stenosis. Tricuspid Valve: The tricuspid valve is normal in structure. Tricuspid valve regurgitation is trivial. No evidence of tricuspid stenosis. Aortic Valve: The aortic valve has an indeterminant number of cusps. Aortic valve regurgitation is not visualized. No aortic stenosis is present. Pulmonic Valve: The pulmonic valve was not well visualized. Pulmonic valve regurgitation is not visualized. No evidence of pulmonic stenosis. Aorta: The aortic root is normal in size and structure. Venous: The inferior vena cava is normal in size with greater than 50% respiratory variability, suggesting right atrial pressure of 3 mmHg. IAS/Shunts: No atrial level shunt detected by color flow Doppler.  LEFT VENTRICLE PLAX 2D LVIDd:         4.70 cm  Diastology LVIDs:         3.40 cm  LV e' medial:    4.57 cm/s LV PW:         0.90 cm  LV E/e' medial:  33.3 LV IVS:        1.10 cm  LV e' lateral:   7.18 cm/s LVOT diam:     2.10 cm  LV E/e' lateral: 21.2 LV SV:         73 LV SV Index:   36 LVOT Area:     3.46 cm  RIGHT VENTRICLE             IVC RV S prime:     13.60 cm/s  IVC diam: 2.20 cm TAPSE (M-mode): 1.5 cm LEFT ATRIUM             Index LA diam:        3.90 cm 1.91 cm/m LA Vol (A2C):   51.9 ml 25.39 ml/m LA Vol (A4C):   63.4 ml 31.02 ml/m LA Biplane Vol: 61.0 ml 29.85 ml/m  AORTIC VALVE LVOT Vmax:   106.00 cm/s LVOT Vmean:  72.100 cm/s LVOT VTI:    0.211 m  AORTA Ao Root diam: 2.90 cm MITRAL VALVE MV Area (PHT): 3.72 cm     SHUNTS MV Decel Time: 204 msec     Systemic VTI:  0.21 m MV E velocity: 152.00 cm/s  Systemic Diam: 2.10 cm MV A velocity: 93.00 cm/s MV E/A ratio:  1.63 Kirk Ruths MD Electronically signed by Kirk Ruths MD Signature Date/Time: 09/25/2020/4:45:44 PM    Final         Discharge Exam: Vitals:   10/16/20 0500 10/16/20 0737  BP: (!) 157/59 (!) 150/49  Pulse: 69 72  Resp: 16   Temp: 98.5 F (36.9 C) 98 F (36.7 C)  SpO2:  94%     General: Pt is alert, awake, not in acute distress Cardiovascular: RRR, S1/S2 +, no edema Respiratory: CTA bilaterally, no wheezing, no rhonchi,  no respiratory distress, no conversational dyspnea, on room air  Abdominal: Soft, NT, ND, bowel sounds + Extremities: no edema, no cyanosis Psych: Normal mood and affect, stable judgement and insight     The results of significant diagnostics from this hospitalization (including imaging, microbiology, ancillary and laboratory) are listed below for reference.     Microbiology: No results found for this or any previous visit (from the past 240 hour(s)).   Labs: BNP (last 3 results) Recent Labs    12/26/19 0818 09/24/20 2137  BNP 116.0* 0000000*   Basic Metabolic Panel: Recent Labs  Lab 10/11/20 0812 10/12/20 0051 10/14/20 0119 10/15/20 0139 10/15/20 2033 10/16/20 0412  NA 132* 135 135 137  --  136  K 4.3 4.2 3.4* 3.2*  --  3.6  CL 102 106 99 96*  --  94*  CO2 18* 21* 27 33*  --  31  GLUCOSE 264* 161* 228* 198* 426* 120*  BUN 35* 37* 29* 26*  --  34*  CREATININE 1.86* 1.82* 1.99* 1.87*  --  2.04*  CALCIUM 8.4* 8.4* 8.2* 8.6*  --  9.0  MG  --  2.2  --   --   --  2.0  PHOS  --  4.6  --   --   --   --    Liver Function Tests: Recent Labs  Lab 10/11/20 0812  AST 19  ALT 23  ALKPHOS 59  BILITOT 0.7  PROT 7.1  ALBUMIN 3.1*   No results for input(s): LIPASE, AMYLASE in the last 168 hours. No results for input(s): AMMONIA in the last 168 hours. CBC: Recent Labs  Lab 10/11/20 0812 10/12/20 0051 10/14/20 0119 10/15/20 0139 10/16/20 0412  WBC 19.0* 12.4* 10.6* 10.0 10.1  HGB 11.3* 10.5* 10.0* 10.1* 10.1*  HCT 34.8* 32.5* 30.6* 31.3*  30.9*  MCV 88.8 87.8 88.2 89.2 87.8  PLT 287 288 245 267 247   Cardiac Enzymes: No results for input(s): CKTOTAL, CKMB, CKMBINDEX, TROPONINI in the last 168 hours. BNP: Invalid input(s): POCBNP CBG: Recent Labs  Lab 10/16/20 0234 10/16/20 0308 10/16/20 0346 10/16/20 0804 10/16/20 1216  GLUCAP 96 74 136* 205* 206*   D-Dimer No results for input(s): DDIMER in the last 72 hours. Hgb A1c No results for input(s): HGBA1C in the last 72 hours. Lipid Profile No results for input(s): CHOL, HDL, LDLCALC, TRIG, CHOLHDL, LDLDIRECT in the last 72 hours. Thyroid function studies No results for input(s): TSH, T4TOTAL, T3FREE, THYROIDAB in the last 72 hours.  Invalid input(s): FREET3 Anemia work up No results for input(s): VITAMINB12, FOLATE, FERRITIN, TIBC, IRON, RETICCTPCT in the last 72 hours. Urinalysis    Component Value Date/Time   COLORURINE YELLOW 09/25/2020 0806   APPEARANCEUR HAZY (A) 09/25/2020 0806   LABSPEC 1.024 09/25/2020 0806   PHURINE 5.0 09/25/2020 0806   GLUCOSEU >=500 (A) 09/25/2020 0806   HGBUR NEGATIVE 09/25/2020 0806   BILIRUBINUR NEGATIVE 09/25/2020 0806   KETONESUR NEGATIVE 09/25/2020 0806   PROTEINUR 100 (A) 09/25/2020 0806   UROBILINOGEN 0.2 02/20/2015 1200   NITRITE NEGATIVE 09/25/2020 0806   LEUKOCYTESUR NEGATIVE 09/25/2020 0806   Sepsis Labs Invalid input(s): PROCALCITONIN,  WBC,  LACTICIDVEN Microbiology No results found for this or any previous visit (from the past 240 hour(s)).   Patient was seen and examined on the day of discharge and was found to be in stable condition. Time coordinating discharge: 40 minutes including assessment and coordination of care, as well as examination of the patient.  SIGNED:  Dessa Phi, DO [facility] 10/16/2020, 12:22 PM

## 2020-10-17 DIAGNOSIS — K859 Acute pancreatitis without necrosis or infection, unspecified: Secondary | ICD-10-CM | POA: Diagnosis not present

## 2020-10-17 DIAGNOSIS — M25561 Pain in right knee: Secondary | ICD-10-CM | POA: Diagnosis not present

## 2020-10-17 DIAGNOSIS — K5900 Constipation, unspecified: Secondary | ICD-10-CM | POA: Diagnosis not present

## 2020-10-17 DIAGNOSIS — J9601 Acute respiratory failure with hypoxia: Secondary | ICD-10-CM | POA: Diagnosis not present

## 2020-10-17 DIAGNOSIS — R339 Retention of urine, unspecified: Secondary | ICD-10-CM | POA: Diagnosis not present

## 2020-10-17 DIAGNOSIS — M1712 Unilateral primary osteoarthritis, left knee: Secondary | ICD-10-CM | POA: Diagnosis not present

## 2020-10-17 DIAGNOSIS — J9600 Acute respiratory failure, unspecified whether with hypoxia or hypercapnia: Secondary | ICD-10-CM | POA: Diagnosis not present

## 2020-10-17 DIAGNOSIS — I5030 Unspecified diastolic (congestive) heart failure: Secondary | ICD-10-CM | POA: Diagnosis not present

## 2020-10-17 DIAGNOSIS — I2510 Atherosclerotic heart disease of native coronary artery without angina pectoris: Secondary | ICD-10-CM | POA: Diagnosis not present

## 2020-10-17 DIAGNOSIS — D631 Anemia in chronic kidney disease: Secondary | ICD-10-CM | POA: Diagnosis not present

## 2020-10-17 DIAGNOSIS — R200 Anesthesia of skin: Secondary | ICD-10-CM | POA: Diagnosis not present

## 2020-10-17 DIAGNOSIS — I4891 Unspecified atrial fibrillation: Secondary | ICD-10-CM | POA: Diagnosis not present

## 2020-10-17 DIAGNOSIS — I679 Cerebrovascular disease, unspecified: Secondary | ICD-10-CM | POA: Diagnosis not present

## 2020-10-17 DIAGNOSIS — M25562 Pain in left knee: Secondary | ICD-10-CM | POA: Diagnosis not present

## 2020-10-17 DIAGNOSIS — F32A Depression, unspecified: Secondary | ICD-10-CM | POA: Diagnosis not present

## 2020-10-17 DIAGNOSIS — G629 Polyneuropathy, unspecified: Secondary | ICD-10-CM | POA: Diagnosis not present

## 2020-10-17 DIAGNOSIS — I10 Essential (primary) hypertension: Secondary | ICD-10-CM | POA: Diagnosis not present

## 2020-10-17 DIAGNOSIS — R0902 Hypoxemia: Secondary | ICD-10-CM | POA: Diagnosis not present

## 2020-10-17 DIAGNOSIS — E1140 Type 2 diabetes mellitus with diabetic neuropathy, unspecified: Secondary | ICD-10-CM | POA: Diagnosis not present

## 2020-10-17 DIAGNOSIS — I480 Paroxysmal atrial fibrillation: Secondary | ICD-10-CM | POA: Diagnosis not present

## 2020-10-17 DIAGNOSIS — E785 Hyperlipidemia, unspecified: Secondary | ICD-10-CM | POA: Diagnosis not present

## 2020-10-17 DIAGNOSIS — E1122 Type 2 diabetes mellitus with diabetic chronic kidney disease: Secondary | ICD-10-CM | POA: Diagnosis not present

## 2020-10-17 DIAGNOSIS — Z7901 Long term (current) use of anticoagulants: Secondary | ICD-10-CM | POA: Diagnosis not present

## 2020-10-17 DIAGNOSIS — Z79899 Other long term (current) drug therapy: Secondary | ICD-10-CM | POA: Diagnosis not present

## 2020-10-17 DIAGNOSIS — E119 Type 2 diabetes mellitus without complications: Secondary | ICD-10-CM | POA: Diagnosis not present

## 2020-10-17 DIAGNOSIS — I639 Cerebral infarction, unspecified: Secondary | ICD-10-CM | POA: Diagnosis not present

## 2020-10-17 DIAGNOSIS — G8929 Other chronic pain: Secondary | ICD-10-CM | POA: Diagnosis not present

## 2020-10-17 LAB — GLUCOSE, CAPILLARY
Glucose-Capillary: 194 mg/dL — ABNORMAL HIGH (ref 70–99)
Glucose-Capillary: 218 mg/dL — ABNORMAL HIGH (ref 70–99)
Glucose-Capillary: 243 mg/dL — ABNORMAL HIGH (ref 70–99)

## 2020-10-17 NOTE — Progress Notes (Signed)
  PROGRESS NOTE  Patient reports feeling better overall. No acute events reported overnight. SNF placement available today. Ready for dc. See updated dc summary.     Dessa Phi, DO [facility] 10/17/2020, 10:07 AM  Available via Epic secure chat 7am-7pm After these hours, please refer to coverage provider listed on [email or web address]

## 2020-10-17 NOTE — TOC Transition Note (Signed)
Transition of Care [facility]) - CM/SW Discharge Note   Patient Details  Name: JENAI BUCKELEW MRN: [id number] Date of Birth: 1935-02-15  Transition of Care [facility]) CM/SW Contact:  Joanne Chars, LCSW Phone Number: 10/17/2020, 10:21 AM   Clinical Narrative:  Pt discharging to [facility].  RN call report to [phone].       Final next level of care: Skilled Nursing Facility Barriers to Discharge: Barriers Resolved   Patient Goals and CMS Choice Patient states their goals for this hospitalization and ongoing recovery are:: "get my legs stronger" CMS Medicare.gov Compare Post Acute Care list provided to:: Patient Choice offered to / list presented to : Patient  Discharge Placement              Patient chooses bed at:  [facility]) Patient to be transferred to facility by: [facility] Name of family member notified: Aniceto Boss, daughter Patient and family notified of of transfer: 10/17/20  Discharge Plan and Services     Post Acute Care Choice: [facility]                               Social Determinants of Health (SDOH) Interventions     Readmission Risk Interventions Readmission Risk Prevention Plan 02/09/2020 07/27/2019  Transportation Screening Complete Complete  Medication Review (RN CM) - Complete  HRI or Home Care Consult Complete -  Social Work Consult for [place] Planning/Counseling Complete -  Palliative Care Screening Not Applicable -  Medication Review [employer] photographer) Complete -  Some recent data might be hidden

## 2020-10-17 NOTE — Progress Notes (Signed)
Report called to [facility] by [facility] LPN.

## 2020-10-22 DIAGNOSIS — I4891 Unspecified atrial fibrillation: Secondary | ICD-10-CM | POA: Diagnosis not present

## 2020-10-22 DIAGNOSIS — I679 Cerebrovascular disease, unspecified: Secondary | ICD-10-CM | POA: Diagnosis not present

## 2020-10-24 DIAGNOSIS — K5900 Constipation, unspecified: Secondary | ICD-10-CM | POA: Diagnosis not present

## 2020-10-24 DIAGNOSIS — I10 Essential (primary) hypertension: Secondary | ICD-10-CM | POA: Diagnosis not present

## 2020-10-24 DIAGNOSIS — E785 Hyperlipidemia, unspecified: Secondary | ICD-10-CM | POA: Diagnosis not present

## 2020-10-24 DIAGNOSIS — E119 Type 2 diabetes mellitus without complications: Secondary | ICD-10-CM | POA: Diagnosis not present

## 2020-10-24 DIAGNOSIS — G629 Polyneuropathy, unspecified: Secondary | ICD-10-CM | POA: Diagnosis not present

## 2020-10-24 DIAGNOSIS — I4891 Unspecified atrial fibrillation: Secondary | ICD-10-CM | POA: Diagnosis not present

## 2020-10-24 DIAGNOSIS — I5030 Unspecified diastolic (congestive) heart failure: Secondary | ICD-10-CM | POA: Diagnosis not present

## 2020-10-24 DIAGNOSIS — K859 Acute pancreatitis without necrosis or infection, unspecified: Secondary | ICD-10-CM | POA: Diagnosis not present

## 2020-11-25 DIAGNOSIS — F32A Depression, unspecified: Secondary | ICD-10-CM | POA: Diagnosis not present

## 2020-11-25 DIAGNOSIS — I639 Cerebral infarction, unspecified: Secondary | ICD-10-CM | POA: Diagnosis not present

## 2020-11-25 DIAGNOSIS — I5030 Unspecified diastolic (congestive) heart failure: Secondary | ICD-10-CM | POA: Diagnosis not present

## 2020-11-25 DIAGNOSIS — I10 Essential (primary) hypertension: Secondary | ICD-10-CM | POA: Diagnosis not present

## 2020-11-25 DIAGNOSIS — E119 Type 2 diabetes mellitus without complications: Secondary | ICD-10-CM | POA: Diagnosis not present

## 2020-11-25 DIAGNOSIS — G629 Polyneuropathy, unspecified: Secondary | ICD-10-CM | POA: Diagnosis not present

## 2020-11-28 ENCOUNTER — Telehealth: Payer: Self-pay | Admitting: Orthopaedic Surgery

## 2020-11-28 NOTE — Telephone Encounter (Signed)
[facility] at [facility] facility, per [facility], faxed a note (as of 11/23/20) to request that patient be scheduled for appointment for left knee brace. Patient is resident there, and had previously been treated and living in [place]; therefore, appointment is pending records. I followed up with Delcie Roch at facility today, 11/28/20 - relays still waiting for records.

## 2020-12-12 DIAGNOSIS — F32A Depression, unspecified: Secondary | ICD-10-CM | POA: Diagnosis not present

## 2020-12-12 DIAGNOSIS — E119 Type 2 diabetes mellitus without complications: Secondary | ICD-10-CM | POA: Diagnosis not present

## 2020-12-12 DIAGNOSIS — I2510 Atherosclerotic heart disease of native coronary artery without angina pectoris: Secondary | ICD-10-CM | POA: Diagnosis not present

## 2020-12-12 DIAGNOSIS — I5030 Unspecified diastolic (congestive) heart failure: Secondary | ICD-10-CM | POA: Diagnosis not present

## 2020-12-12 DIAGNOSIS — I639 Cerebral infarction, unspecified: Secondary | ICD-10-CM | POA: Diagnosis not present

## 2020-12-12 DIAGNOSIS — E785 Hyperlipidemia, unspecified: Secondary | ICD-10-CM | POA: Diagnosis not present

## 2020-12-12 DIAGNOSIS — I10 Essential (primary) hypertension: Secondary | ICD-10-CM | POA: Diagnosis not present

## 2020-12-12 DIAGNOSIS — I4891 Unspecified atrial fibrillation: Secondary | ICD-10-CM | POA: Diagnosis not present

## 2020-12-17 ENCOUNTER — Ambulatory Visit (INDEPENDENT_AMBULATORY_CARE_PROVIDER_SITE_OTHER): Payer: Medicare HMO | Admitting: Orthopaedic Surgery

## 2020-12-17 ENCOUNTER — Encounter: Payer: Self-pay | Admitting: Orthopaedic Surgery

## 2020-12-17 ENCOUNTER — Other Ambulatory Visit: Payer: Self-pay

## 2020-12-17 VITALS — BP 151/68 | HR 71 | Ht 65.0 in | Wt 200.0 lb

## 2020-12-17 DIAGNOSIS — M1712 Unilateral primary osteoarthritis, left knee: Secondary | ICD-10-CM

## 2020-12-17 DIAGNOSIS — Z7901 Long term (current) use of anticoagulants: Secondary | ICD-10-CM

## 2020-12-17 NOTE — Progress Notes (Signed)
Subjective:    Patient ID: [name], female    DOB: 04/18/1935, 86 y.o.   MRN: [id number]  HPI She has bilateral knee pain, more on the left.  She has had knee pain for many years.  She had a stroke in 2003 affecting the right side of her body and then had a fractured hip on the right.  She has used the left side as her "good side".  In May of this year she had an intercerebral bleed.  She has had some left sided weakness at times.  She cannot walk well. She is confined to wheelchair. She is a resident at local nursing home.  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  Patient is on blood thinner. She is a diabetic.  There is no trauma to the left knee.  She has swelling and popping and pain getting worse.  She had recent X-rays and I have reviewed them and the notes.  I have independently reviewed and interpreted x-rays of this patient done at another site by another physician or qualified health professional.  She has marked DJD of the knees.  Review of Systems  Constitutional:  Positive for activity change.  Respiratory:  Positive for shortness of breath.   Cardiovascular:  Positive for chest pain.  Musculoskeletal:  Positive for arthralgias, gait problem and joint swelling.  Neurological:  Positive for weakness.  All other systems reviewed and are negative. For Review of Systems, all other systems reviewed and are negative.  The following is a summary of the past history medically, past history surgically, known current medicines, social history and family history.  This information is gathered electronically by the computer from prior information and documentation.  I review this each visit and have found including this information at this point in the chart is beneficial and informative.   Past Medical History:  Diagnosis Date   Arthritis    CHF (congestive heart failure) (HCC)    Chronic diastolic heart failure (HCC)    CKD (chronic kidney  disease) stage 3, GFR 30-59 ml/min (HCC)    Essential hypertension    GERD (gastroesophageal reflux disease)    Hemorrhoids    History of stroke    Type 2 diabetes mellitus ([place])     Past Surgical History:  Procedure Laterality Date   BACK SURGERY     CATARACT EXTRACTION W/PHACO Left 02/28/2013   Procedure: CATARACT EXTRACTION PHACO AND INTRAOCULAR LENS PLACEMENT (IOL) CDE=15.60;  Surgeon: Elta Guadeloupe T. Gershon Crane, MD;  Location: [facility];  Service: Ophthalmology;  Laterality: Left;   CHOLECYSTECTOMY     COLONOSCOPY  12/12/2003   RMR: Normal rectum.  Long capacious tortuous colon, colonic mucosa appeared normal   COLONOSCOPY N/A 03/21/2014   Dr. [name]: internal and external hemorrhoids, torturous colon, colonic diverticulosis    ESOPHAGOGASTRODUODENOSCOPY  12/28/2001   [id number] sliding hiatal hernia/. Three small bulbar ulcers, two with stigmata of bleeding and these were coagulated using heater probe unit    ESOPHAGOGASTRODUODENOSCOPY N/A 03/21/2014   Dr. [name]: cervical esophageal web s/p dilation, negative H.pylori    EYE SURGERY     HIP FRACTURE SURGERY  2008   JOINT REPLACEMENT     Rt hip, ????? sounds like just for fracture   MALONEY DILATION N/A 03/21/2014   Procedure: [name] DILATION;  Surgeon: [name], MD;  [facility];  Service: Endoscopy;  Laterality: N/A;   SAVORY DILATION N/A 03/21/2014  Procedure: SAVORY DILATION;  Surgeon: [name], MD;  [facility]: [facility];  Service: Endoscopy;  Laterality: N/A;    Current Outpatient Medications on File Prior to Visit  Medication Sig Dispense Refill   acetaminophen (TYLENOL) 325 MG tablet Take 2 tablets (650 mg total) by mouth every 6 (six) hours as needed for mild pain, fever or headache (or Fever >/= 101). 12 tablet 0   amLODipine (NORVASC) 10 MG tablet Take 1 tablet (10 mg total) by mouth daily.     diclofenac Sodium (VOLTAREN) 1 % GEL Apply 2 g topically 4 (four) times daily.     DULoxetine (CYMBALTA) 30 MG  capsule Take 1 capsule (30 mg total) by mouth daily.  3   furosemide (LASIX) 40 MG tablet Take 1 tablet (40 mg total) by mouth daily. 30 tablet 0   hydrALAZINE (APRESOLINE) 100 MG tablet Take 1 tablet (100 mg total) by mouth 3 (three) times daily.     hydrocortisone (ANUSOL-HC) 2.5 % rectal cream Place 1 application rectally 2 (two) times daily as needed for hemorrhoids or anal itching.     insulin aspart (NOVOLOG) 100 UNIT/ML FlexPen Inject 0-10 Units into the skin 3 (three) times daily with meals. insulin aspart (novoLOG) injection 0-10 Units 0-10 Units Subcutaneous, 3 times daily with meals CBG < 70: Implement Hypoglycemia Standing Orders and refer to Hypoglycemia Standing Orders sidebar report  CBG 70 - 120: 0 unit CBG 121 - 150: 0 unit  CBG 151 - 200: 1 unit CBG 201 - 250: 2 units CBG 251 - 300: 4 units CBG 301 - 350: 6 units  CBG 351 - 400: 8 units  CBG > 400: 10 units 15 mL 11   insulin aspart (NOVOLOG) 100 UNIT/ML injection Inject 5 Units into the skin 3 (three) times daily with meals. 10 mL 11   insulin detemir (LEVEMIR) 100 UNIT/ML injection Inject 0.22 mLs (22 Units total) into the skin 2 (two) times daily. 10 mL 0   isosorbide mononitrate (IMDUR) 60 MG 24 hr tablet Take 1 tablet (60 mg total) by mouth daily. 30 tablet 11   metoprolol succinate (TOPROL-XL) 25 MG 24 hr tablet Take 1 tablet (25 mg total) by mouth daily. 30 tablet 0   ondansetron (ZOFRAN) 4 MG tablet Take 4 mg by mouth every 6 (six) hours as needed for nausea or vomiting.     pantoprazole (PROTONIX) 40 MG tablet Take 1 tablet by mouth daily.     Current Facility-Administered Medications on File Prior to Visit  Medication Dose Route Frequency Provider Last Rate Last Admin   regadenoson (LEXISCAN) injection SOLN 0.4 mg  0.4 mg Intravenous Once Croitoru, Mihai, MD        Social History   Socioeconomic History   Marital status: Widowed    Spouse name: Not on file   Number of children: 8   Years of education: Not on file    Highest education level: Not on file  Occupational History   Not on file  Tobacco Use   Smoking status: Never   Smokeless tobacco: Never  Vaping Use   Vaping Use: Never used  Substance and Sexual Activity   Alcohol use: No   Drug use: No   Sexual activity: Not on file  Other Topics Concern   Not on file  Social History Narrative   Not on file   Social Determinants of Health   Financial Resource Strain: Not on file  Food Insecurity: Not on file  Transportation  Needs: Not on file  Physical Activity: Not on file  Stress: Not on file  Social Connections: Not on file  Intimate Partner Violence: Not on file    Family History  Problem Relation Age of Onset   Crohn's disease Daughter    Hypertension Mother    Hypertension Sister    Diabetes Brother    Diabetes Sister    Diabetes Sister    Heart attack Sister    Colon cancer Neg Hx     BP (!) 151/68   Pulse 71   Ht '5\' 5"'$  (1.651 m)   Wt 200 lb (90.7 kg)   BMI 33.28 kg/m   Body mass index is 33.28 kg/m.     Objective:   Physical Exam Vitals and nursing note reviewed. Exam conducted with a chaperone present.  Constitutional:      Appearance: She is well-developed.     Comments: Confined to wheelchair, unable to stand.  HENT:     Head: Normocephalic and atraumatic.  Eyes:     Conjunctiva/sclera: Conjunctivae normal.     Pupils: Pupils are equal, round, and reactive to light.  Cardiovascular:     Rate and Rhythm: Normal rate and regular rhythm.  Pulmonary:     Effort: Pulmonary effort is normal.  Abdominal:     Palpations: Abdomen is soft.  Musculoskeletal:     Cervical back: Normal range of motion and neck supple.       Legs:  Skin:    General: Skin is warm and dry.  Neurological:     Mental Status: She is alert and oriented to person, place, and time.     Cranial Nerves: No cranial nerve deficit.     Motor: No abnormal muscle tone.     Coordination: Coordination normal.     Deep Tendon Reflexes:  Reflexes are normal and symmetric. Reflexes normal.  Psychiatric:        Behavior: Behavior normal.        Thought Content: Thought content normal.        Judgment: Judgment normal.          Assessment & Plan:   Encounter Diagnoses  Name Primary?   Unilateral primary osteoarthritis, left knee Yes   Anticoagulated    PROCEDURE NOTE:  The patient requests injections of the left knee , verbal consent was obtained.  The left knee was prepped appropriately after time out was performed.   Sterile technique was observed and injection of 1 cc of Celestone 6 mg with several cc's of plain xylocaine. Anesthesia was provided by ethyl chloride and a 20-gauge needle was used to inject the knee area. The injection was tolerated well.  A band aid dressing was applied.  The patient was advised to apply ice later today and tomorrow to the injection sight as needed.   I have filled out forms for the nursing home.  Return in one month.  Call if any problem.  Precautions discussed.  Electronically Signed Sanjuana Kava, MD 8/2/20222:32 PM

## 2020-12-30 DIAGNOSIS — I10 Essential (primary) hypertension: Secondary | ICD-10-CM | POA: Diagnosis not present

## 2020-12-30 DIAGNOSIS — I4891 Unspecified atrial fibrillation: Secondary | ICD-10-CM | POA: Diagnosis not present

## 2020-12-30 DIAGNOSIS — F32A Depression, unspecified: Secondary | ICD-10-CM | POA: Diagnosis not present

## 2020-12-30 DIAGNOSIS — G629 Polyneuropathy, unspecified: Secondary | ICD-10-CM | POA: Diagnosis not present

## 2020-12-30 DIAGNOSIS — E785 Hyperlipidemia, unspecified: Secondary | ICD-10-CM | POA: Diagnosis not present

## 2020-12-30 DIAGNOSIS — I639 Cerebral infarction, unspecified: Secondary | ICD-10-CM | POA: Diagnosis not present

## 2020-12-30 DIAGNOSIS — M25562 Pain in left knee: Secondary | ICD-10-CM | POA: Diagnosis not present

## 2020-12-30 DIAGNOSIS — M25561 Pain in right knee: Secondary | ICD-10-CM | POA: Diagnosis not present

## 2021-01-06 DIAGNOSIS — G8929 Other chronic pain: Secondary | ICD-10-CM | POA: Diagnosis not present

## 2021-01-06 DIAGNOSIS — G629 Polyneuropathy, unspecified: Secondary | ICD-10-CM | POA: Diagnosis not present

## 2021-01-06 DIAGNOSIS — E119 Type 2 diabetes mellitus without complications: Secondary | ICD-10-CM | POA: Diagnosis not present

## 2021-01-06 DIAGNOSIS — F32A Depression, unspecified: Secondary | ICD-10-CM | POA: Diagnosis not present

## 2021-01-06 DIAGNOSIS — I10 Essential (primary) hypertension: Secondary | ICD-10-CM | POA: Diagnosis not present

## 2021-01-06 DIAGNOSIS — R200 Anesthesia of skin: Secondary | ICD-10-CM | POA: Diagnosis not present

## 2021-01-13 ENCOUNTER — Inpatient Hospital Stay: Payer: Medicare HMO | Admitting: Neurology

## 2021-01-13 DIAGNOSIS — I10 Essential (primary) hypertension: Secondary | ICD-10-CM | POA: Diagnosis not present

## 2021-01-13 DIAGNOSIS — I4891 Unspecified atrial fibrillation: Secondary | ICD-10-CM | POA: Diagnosis not present

## 2021-01-13 DIAGNOSIS — I2510 Atherosclerotic heart disease of native coronary artery without angina pectoris: Secondary | ICD-10-CM | POA: Diagnosis not present

## 2021-01-13 DIAGNOSIS — I5030 Unspecified diastolic (congestive) heart failure: Secondary | ICD-10-CM | POA: Diagnosis not present

## 2021-01-13 DIAGNOSIS — E785 Hyperlipidemia, unspecified: Secondary | ICD-10-CM | POA: Diagnosis not present

## 2021-01-13 DIAGNOSIS — I639 Cerebral infarction, unspecified: Secondary | ICD-10-CM | POA: Diagnosis not present

## 2021-01-13 DIAGNOSIS — G629 Polyneuropathy, unspecified: Secondary | ICD-10-CM | POA: Diagnosis not present

## 2021-01-13 DIAGNOSIS — E119 Type 2 diabetes mellitus without complications: Secondary | ICD-10-CM | POA: Diagnosis not present

## 2021-01-14 ENCOUNTER — Ambulatory Visit (INDEPENDENT_AMBULATORY_CARE_PROVIDER_SITE_OTHER): Payer: Medicare HMO | Admitting: Orthopaedic Surgery

## 2021-01-14 ENCOUNTER — Other Ambulatory Visit: Payer: Self-pay

## 2021-01-14 ENCOUNTER — Encounter: Payer: Self-pay | Admitting: Orthopaedic Surgery

## 2021-01-14 VITALS — BP 136/61 | HR 71

## 2021-01-14 DIAGNOSIS — Z7901 Long term (current) use of anticoagulants: Secondary | ICD-10-CM

## 2021-01-14 DIAGNOSIS — M1712 Unilateral primary osteoarthritis, left knee: Secondary | ICD-10-CM | POA: Diagnosis not present

## 2021-01-14 NOTE — Progress Notes (Signed)
PROCEDURE NOTE:  The patient requests injections of the left knee , verbal consent was obtained.  The left knee was prepped appropriately after time out was performed.   Sterile technique was observed and injection of 1 cc of Cel[place] 6 mg with several cc's of plain xylocaine. Anesthesia was provided by ethyl chloride and a 20-gauge needle was used to inject the knee area. The injection was tolerated well.  A band aid dressing was applied.  The patient was advised to apply ice later today and tomorrow to the injection sight as needed.   Forms completed for nursing home.  Return in two months.  Call if any problem.  Precautions discussed.  Electronically Signed Sanjuana Kava, MD 8/30/20222:56 PM

## 2021-01-16 ENCOUNTER — Other Ambulatory Visit: Payer: Self-pay

## 2021-01-16 DIAGNOSIS — R2689 Other abnormalities of gait and mobility: Secondary | ICD-10-CM | POA: Diagnosis not present

## 2021-01-16 DIAGNOSIS — M6281 Muscle weakness (generalized): Secondary | ICD-10-CM | POA: Diagnosis not present

## 2021-01-16 DIAGNOSIS — R262 Difficulty in walking, not elsewhere classified: Secondary | ICD-10-CM | POA: Diagnosis not present

## 2021-01-16 NOTE — Patient Outreach (Signed)
[place] [facility]) Care Management  01/16/2021  [name] 1934/12/30 [id number]     Transition of Care Referral  Referral Date: 01/16/2021 Referral Source: [facility] Discharge Report Date of Discharge: [id number] Facility: [place]    Unsuccessful outreach attempt # 1 to patient.     Plan: RN CM will make outreach attempt within 3-4 business days.   Enzo Montgomery, RN,BSN,CCM [place] Management Telephonic Care Management Coordinator Direct Phone: [phone] Toll Free: [phone] Fax: [phone]

## 2021-01-17 ENCOUNTER — Inpatient Hospital Stay: Payer: Medicare HMO | Admitting: Neurology

## 2021-01-17 ENCOUNTER — Other Ambulatory Visit: Payer: Self-pay

## 2021-01-17 DIAGNOSIS — N1832 Chronic kidney disease, stage 3b: Secondary | ICD-10-CM | POA: Diagnosis not present

## 2021-01-17 DIAGNOSIS — I5033 Acute on chronic diastolic (congestive) heart failure: Secondary | ICD-10-CM | POA: Diagnosis not present

## 2021-01-17 DIAGNOSIS — R531 Weakness: Secondary | ICD-10-CM | POA: Diagnosis not present

## 2021-01-17 DIAGNOSIS — J9621 Acute and chronic respiratory failure with hypoxia: Secondary | ICD-10-CM | POA: Diagnosis not present

## 2021-01-17 NOTE — Patient Outreach (Signed)
[place] [facility]) Care Management  01/17/2021  [name] 1934-08-03 [id number]   Transition of Care Referral   Referral Date: 01/16/2021 Referral Source: [facility] Discharge Report Date of Discharge: [id number] Facility: [place]    Unsuccessful outreach attempt.   Plan: RN CM will make outreach attempt within 3-4 business days.  Enzo Montgomery, RN,BSN,CCM [place] Management Telephonic Care Management Coordinator Direct Phone: [phone] Toll Free: [phone] Fax: [phone]

## 2021-01-21 ENCOUNTER — Other Ambulatory Visit: Payer: Self-pay

## 2021-01-21 NOTE — Patient Outreach (Signed)
[facility]) Care Management  01/21/2021  [name] 02-Mar-1935 [id number]   Transition of Care Referral   Referral Date: 01/16/2021 Referral Source: [facility] Discharge Report Date of Discharge: [id number] Facility: [place]   Unsuccessful outreach attempt.   Plan: RN CM will make outreach attempt to patient within 3-4 wks if no response from letter mailed to patient. Enzo Montgomery, RN,BSN,CCM [place] Management Telephonic Care Management Coordinator Direct Phone: [phone] Toll Free: [phone] Fax: [phone]

## 2021-01-22 DIAGNOSIS — E1140 Type 2 diabetes mellitus with diabetic neuropathy, unspecified: Secondary | ICD-10-CM | POA: Diagnosis not present

## 2021-01-22 DIAGNOSIS — G8929 Other chronic pain: Secondary | ICD-10-CM | POA: Diagnosis not present

## 2021-01-22 DIAGNOSIS — I130 Hypertensive heart and chronic kidney disease with heart failure and stage 1 through stage 4 chronic kidney disease, or unspecified chronic kidney disease: Secondary | ICD-10-CM | POA: Diagnosis not present

## 2021-01-22 DIAGNOSIS — I4891 Unspecified atrial fibrillation: Secondary | ICD-10-CM | POA: Diagnosis not present

## 2021-01-22 DIAGNOSIS — N183 Chronic kidney disease, stage 3 unspecified: Secondary | ICD-10-CM | POA: Diagnosis not present

## 2021-01-22 DIAGNOSIS — E1122 Type 2 diabetes mellitus with diabetic chronic kidney disease: Secondary | ICD-10-CM | POA: Diagnosis not present

## 2021-01-22 DIAGNOSIS — R200 Anesthesia of skin: Secondary | ICD-10-CM | POA: Diagnosis not present

## 2021-01-22 DIAGNOSIS — D631 Anemia in chronic kidney disease: Secondary | ICD-10-CM | POA: Diagnosis not present

## 2021-01-22 DIAGNOSIS — I5032 Chronic diastolic (congestive) heart failure: Secondary | ICD-10-CM | POA: Diagnosis not present

## 2021-01-28 DIAGNOSIS — E1140 Type 2 diabetes mellitus with diabetic neuropathy, unspecified: Secondary | ICD-10-CM | POA: Diagnosis not present

## 2021-01-28 DIAGNOSIS — N183 Chronic kidney disease, stage 3 unspecified: Secondary | ICD-10-CM | POA: Diagnosis not present

## 2021-01-28 DIAGNOSIS — G8929 Other chronic pain: Secondary | ICD-10-CM | POA: Diagnosis not present

## 2021-01-28 DIAGNOSIS — D631 Anemia in chronic kidney disease: Secondary | ICD-10-CM | POA: Diagnosis not present

## 2021-01-28 DIAGNOSIS — I4891 Unspecified atrial fibrillation: Secondary | ICD-10-CM | POA: Diagnosis not present

## 2021-01-28 DIAGNOSIS — I5032 Chronic diastolic (congestive) heart failure: Secondary | ICD-10-CM | POA: Diagnosis not present

## 2021-01-28 DIAGNOSIS — R200 Anesthesia of skin: Secondary | ICD-10-CM | POA: Diagnosis not present

## 2021-01-28 DIAGNOSIS — I130 Hypertensive heart and chronic kidney disease with heart failure and stage 1 through stage 4 chronic kidney disease, or unspecified chronic kidney disease: Secondary | ICD-10-CM | POA: Diagnosis not present

## 2021-01-28 DIAGNOSIS — E1122 Type 2 diabetes mellitus with diabetic chronic kidney disease: Secondary | ICD-10-CM | POA: Diagnosis not present

## 2021-01-29 DIAGNOSIS — E1140 Type 2 diabetes mellitus with diabetic neuropathy, unspecified: Secondary | ICD-10-CM | POA: Diagnosis not present

## 2021-01-29 DIAGNOSIS — I5032 Chronic diastolic (congestive) heart failure: Secondary | ICD-10-CM | POA: Diagnosis not present

## 2021-01-29 DIAGNOSIS — I4891 Unspecified atrial fibrillation: Secondary | ICD-10-CM | POA: Diagnosis not present

## 2021-01-29 DIAGNOSIS — D631 Anemia in chronic kidney disease: Secondary | ICD-10-CM | POA: Diagnosis not present

## 2021-01-29 DIAGNOSIS — E1122 Type 2 diabetes mellitus with diabetic chronic kidney disease: Secondary | ICD-10-CM | POA: Diagnosis not present

## 2021-01-29 DIAGNOSIS — I130 Hypertensive heart and chronic kidney disease with heart failure and stage 1 through stage 4 chronic kidney disease, or unspecified chronic kidney disease: Secondary | ICD-10-CM | POA: Diagnosis not present

## 2021-01-29 DIAGNOSIS — N183 Chronic kidney disease, stage 3 unspecified: Secondary | ICD-10-CM | POA: Diagnosis not present

## 2021-01-29 DIAGNOSIS — G8929 Other chronic pain: Secondary | ICD-10-CM | POA: Diagnosis not present

## 2021-01-29 DIAGNOSIS — R200 Anesthesia of skin: Secondary | ICD-10-CM | POA: Diagnosis not present

## 2021-01-30 DIAGNOSIS — D631 Anemia in chronic kidney disease: Secondary | ICD-10-CM | POA: Diagnosis not present

## 2021-01-30 DIAGNOSIS — E1140 Type 2 diabetes mellitus with diabetic neuropathy, unspecified: Secondary | ICD-10-CM | POA: Diagnosis not present

## 2021-01-30 DIAGNOSIS — I130 Hypertensive heart and chronic kidney disease with heart failure and stage 1 through stage 4 chronic kidney disease, or unspecified chronic kidney disease: Secondary | ICD-10-CM | POA: Diagnosis not present

## 2021-01-30 DIAGNOSIS — R200 Anesthesia of skin: Secondary | ICD-10-CM | POA: Diagnosis not present

## 2021-01-30 DIAGNOSIS — E1122 Type 2 diabetes mellitus with diabetic chronic kidney disease: Secondary | ICD-10-CM | POA: Diagnosis not present

## 2021-01-30 DIAGNOSIS — G8929 Other chronic pain: Secondary | ICD-10-CM | POA: Diagnosis not present

## 2021-01-30 DIAGNOSIS — N183 Chronic kidney disease, stage 3 unspecified: Secondary | ICD-10-CM | POA: Diagnosis not present

## 2021-01-30 DIAGNOSIS — I5032 Chronic diastolic (congestive) heart failure: Secondary | ICD-10-CM | POA: Diagnosis not present

## 2021-01-30 DIAGNOSIS — I4891 Unspecified atrial fibrillation: Secondary | ICD-10-CM | POA: Diagnosis not present

## 2021-02-04 DIAGNOSIS — G8929 Other chronic pain: Secondary | ICD-10-CM | POA: Diagnosis not present

## 2021-02-04 DIAGNOSIS — E1122 Type 2 diabetes mellitus with diabetic chronic kidney disease: Secondary | ICD-10-CM | POA: Diagnosis not present

## 2021-02-04 DIAGNOSIS — D631 Anemia in chronic kidney disease: Secondary | ICD-10-CM | POA: Diagnosis not present

## 2021-02-04 DIAGNOSIS — I4891 Unspecified atrial fibrillation: Secondary | ICD-10-CM | POA: Diagnosis not present

## 2021-02-04 DIAGNOSIS — N183 Chronic kidney disease, stage 3 unspecified: Secondary | ICD-10-CM | POA: Diagnosis not present

## 2021-02-04 DIAGNOSIS — R200 Anesthesia of skin: Secondary | ICD-10-CM | POA: Diagnosis not present

## 2021-02-04 DIAGNOSIS — I130 Hypertensive heart and chronic kidney disease with heart failure and stage 1 through stage 4 chronic kidney disease, or unspecified chronic kidney disease: Secondary | ICD-10-CM | POA: Diagnosis not present

## 2021-02-04 DIAGNOSIS — E1140 Type 2 diabetes mellitus with diabetic neuropathy, unspecified: Secondary | ICD-10-CM | POA: Diagnosis not present

## 2021-02-04 DIAGNOSIS — I5032 Chronic diastolic (congestive) heart failure: Secondary | ICD-10-CM | POA: Diagnosis not present

## 2021-02-06 DIAGNOSIS — I130 Hypertensive heart and chronic kidney disease with heart failure and stage 1 through stage 4 chronic kidney disease, or unspecified chronic kidney disease: Secondary | ICD-10-CM | POA: Diagnosis not present

## 2021-02-06 DIAGNOSIS — R200 Anesthesia of skin: Secondary | ICD-10-CM | POA: Diagnosis not present

## 2021-02-06 DIAGNOSIS — I4891 Unspecified atrial fibrillation: Secondary | ICD-10-CM | POA: Diagnosis not present

## 2021-02-06 DIAGNOSIS — D631 Anemia in chronic kidney disease: Secondary | ICD-10-CM | POA: Diagnosis not present

## 2021-02-06 DIAGNOSIS — E1122 Type 2 diabetes mellitus with diabetic chronic kidney disease: Secondary | ICD-10-CM | POA: Diagnosis not present

## 2021-02-06 DIAGNOSIS — E1140 Type 2 diabetes mellitus with diabetic neuropathy, unspecified: Secondary | ICD-10-CM | POA: Diagnosis not present

## 2021-02-06 DIAGNOSIS — G8929 Other chronic pain: Secondary | ICD-10-CM | POA: Diagnosis not present

## 2021-02-06 DIAGNOSIS — I5032 Chronic diastolic (congestive) heart failure: Secondary | ICD-10-CM | POA: Diagnosis not present

## 2021-02-06 DIAGNOSIS — N183 Chronic kidney disease, stage 3 unspecified: Secondary | ICD-10-CM | POA: Diagnosis not present

## 2021-02-07 DIAGNOSIS — I130 Hypertensive heart and chronic kidney disease with heart failure and stage 1 through stage 4 chronic kidney disease, or unspecified chronic kidney disease: Secondary | ICD-10-CM | POA: Diagnosis not present

## 2021-02-07 DIAGNOSIS — N183 Chronic kidney disease, stage 3 unspecified: Secondary | ICD-10-CM | POA: Diagnosis not present

## 2021-02-07 DIAGNOSIS — G8929 Other chronic pain: Secondary | ICD-10-CM | POA: Diagnosis not present

## 2021-02-07 DIAGNOSIS — I4891 Unspecified atrial fibrillation: Secondary | ICD-10-CM | POA: Diagnosis not present

## 2021-02-07 DIAGNOSIS — E1140 Type 2 diabetes mellitus with diabetic neuropathy, unspecified: Secondary | ICD-10-CM | POA: Diagnosis not present

## 2021-02-07 DIAGNOSIS — E1122 Type 2 diabetes mellitus with diabetic chronic kidney disease: Secondary | ICD-10-CM | POA: Diagnosis not present

## 2021-02-07 DIAGNOSIS — D631 Anemia in chronic kidney disease: Secondary | ICD-10-CM | POA: Diagnosis not present

## 2021-02-07 DIAGNOSIS — I5032 Chronic diastolic (congestive) heart failure: Secondary | ICD-10-CM | POA: Diagnosis not present

## 2021-02-07 DIAGNOSIS — R200 Anesthesia of skin: Secondary | ICD-10-CM | POA: Diagnosis not present

## 2021-02-10 ENCOUNTER — Other Ambulatory Visit: Payer: Self-pay

## 2021-02-10 NOTE — Patient Outreach (Signed)
[facility]) Care Management  02/10/2021  [name] 1934/10/11 [id number]   Transition of Care Referral   Referral Date: 01/16/2021 Referral Source: [employer] Discharge Report Date of Discharge: [id number] Facility: [facility]   Multiple attempts to establish contact with patient without success. No response from letter mailed to patient. Case is being closed at this time.     Plan: RN CM will close case at this time.    Enzo Montgomery, RN,BSN,CCM [place] Management Telephonic Care Management Coordinator Direct Phone: [phone] Toll Free: [phone] Fax: [phone]

## 2021-02-11 DIAGNOSIS — G8929 Other chronic pain: Secondary | ICD-10-CM | POA: Diagnosis not present

## 2021-02-11 DIAGNOSIS — I130 Hypertensive heart and chronic kidney disease with heart failure and stage 1 through stage 4 chronic kidney disease, or unspecified chronic kidney disease: Secondary | ICD-10-CM | POA: Diagnosis not present

## 2021-02-11 DIAGNOSIS — E1140 Type 2 diabetes mellitus with diabetic neuropathy, unspecified: Secondary | ICD-10-CM | POA: Diagnosis not present

## 2021-02-11 DIAGNOSIS — D631 Anemia in chronic kidney disease: Secondary | ICD-10-CM | POA: Diagnosis not present

## 2021-02-11 DIAGNOSIS — I5032 Chronic diastolic (congestive) heart failure: Secondary | ICD-10-CM | POA: Diagnosis not present

## 2021-02-11 DIAGNOSIS — I4891 Unspecified atrial fibrillation: Secondary | ICD-10-CM | POA: Diagnosis not present

## 2021-02-11 DIAGNOSIS — N183 Chronic kidney disease, stage 3 unspecified: Secondary | ICD-10-CM | POA: Diagnosis not present

## 2021-02-11 DIAGNOSIS — R200 Anesthesia of skin: Secondary | ICD-10-CM | POA: Diagnosis not present

## 2021-02-11 DIAGNOSIS — E1122 Type 2 diabetes mellitus with diabetic chronic kidney disease: Secondary | ICD-10-CM | POA: Diagnosis not present

## 2021-02-13 DIAGNOSIS — G8929 Other chronic pain: Secondary | ICD-10-CM | POA: Diagnosis not present

## 2021-02-13 DIAGNOSIS — E1140 Type 2 diabetes mellitus with diabetic neuropathy, unspecified: Secondary | ICD-10-CM | POA: Diagnosis not present

## 2021-02-13 DIAGNOSIS — R200 Anesthesia of skin: Secondary | ICD-10-CM | POA: Diagnosis not present

## 2021-02-13 DIAGNOSIS — N183 Chronic kidney disease, stage 3 unspecified: Secondary | ICD-10-CM | POA: Diagnosis not present

## 2021-02-13 DIAGNOSIS — D631 Anemia in chronic kidney disease: Secondary | ICD-10-CM | POA: Diagnosis not present

## 2021-02-13 DIAGNOSIS — E1122 Type 2 diabetes mellitus with diabetic chronic kidney disease: Secondary | ICD-10-CM | POA: Diagnosis not present

## 2021-02-13 DIAGNOSIS — I4891 Unspecified atrial fibrillation: Secondary | ICD-10-CM | POA: Diagnosis not present

## 2021-02-13 DIAGNOSIS — I130 Hypertensive heart and chronic kidney disease with heart failure and stage 1 through stage 4 chronic kidney disease, or unspecified chronic kidney disease: Secondary | ICD-10-CM | POA: Diagnosis not present

## 2021-02-13 DIAGNOSIS — I5032 Chronic diastolic (congestive) heart failure: Secondary | ICD-10-CM | POA: Diagnosis not present

## 2021-02-14 DIAGNOSIS — G8929 Other chronic pain: Secondary | ICD-10-CM | POA: Diagnosis not present

## 2021-02-14 DIAGNOSIS — N183 Chronic kidney disease, stage 3 unspecified: Secondary | ICD-10-CM | POA: Diagnosis not present

## 2021-02-14 DIAGNOSIS — E1122 Type 2 diabetes mellitus with diabetic chronic kidney disease: Secondary | ICD-10-CM | POA: Diagnosis not present

## 2021-02-14 DIAGNOSIS — R200 Anesthesia of skin: Secondary | ICD-10-CM | POA: Diagnosis not present

## 2021-02-14 DIAGNOSIS — I4891 Unspecified atrial fibrillation: Secondary | ICD-10-CM | POA: Diagnosis not present

## 2021-02-14 DIAGNOSIS — I130 Hypertensive heart and chronic kidney disease with heart failure and stage 1 through stage 4 chronic kidney disease, or unspecified chronic kidney disease: Secondary | ICD-10-CM | POA: Diagnosis not present

## 2021-02-14 DIAGNOSIS — D631 Anemia in chronic kidney disease: Secondary | ICD-10-CM | POA: Diagnosis not present

## 2021-02-14 DIAGNOSIS — I5032 Chronic diastolic (congestive) heart failure: Secondary | ICD-10-CM | POA: Diagnosis not present

## 2021-02-14 DIAGNOSIS — E1140 Type 2 diabetes mellitus with diabetic neuropathy, unspecified: Secondary | ICD-10-CM | POA: Diagnosis not present

## 2021-02-18 DIAGNOSIS — E1122 Type 2 diabetes mellitus with diabetic chronic kidney disease: Secondary | ICD-10-CM | POA: Diagnosis not present

## 2021-02-18 DIAGNOSIS — G8929 Other chronic pain: Secondary | ICD-10-CM | POA: Diagnosis not present

## 2021-02-18 DIAGNOSIS — I4891 Unspecified atrial fibrillation: Secondary | ICD-10-CM | POA: Diagnosis not present

## 2021-02-18 DIAGNOSIS — I5032 Chronic diastolic (congestive) heart failure: Secondary | ICD-10-CM | POA: Diagnosis not present

## 2021-02-18 DIAGNOSIS — E1140 Type 2 diabetes mellitus with diabetic neuropathy, unspecified: Secondary | ICD-10-CM | POA: Diagnosis not present

## 2021-02-18 DIAGNOSIS — I130 Hypertensive heart and chronic kidney disease with heart failure and stage 1 through stage 4 chronic kidney disease, or unspecified chronic kidney disease: Secondary | ICD-10-CM | POA: Diagnosis not present

## 2021-02-18 DIAGNOSIS — R200 Anesthesia of skin: Secondary | ICD-10-CM | POA: Diagnosis not present

## 2021-02-18 DIAGNOSIS — D631 Anemia in chronic kidney disease: Secondary | ICD-10-CM | POA: Diagnosis not present

## 2021-02-18 DIAGNOSIS — N183 Chronic kidney disease, stage 3 unspecified: Secondary | ICD-10-CM | POA: Diagnosis not present

## 2021-02-19 DIAGNOSIS — I130 Hypertensive heart and chronic kidney disease with heart failure and stage 1 through stage 4 chronic kidney disease, or unspecified chronic kidney disease: Secondary | ICD-10-CM | POA: Diagnosis not present

## 2021-02-19 DIAGNOSIS — D631 Anemia in chronic kidney disease: Secondary | ICD-10-CM | POA: Diagnosis not present

## 2021-02-19 DIAGNOSIS — E1140 Type 2 diabetes mellitus with diabetic neuropathy, unspecified: Secondary | ICD-10-CM | POA: Diagnosis not present

## 2021-02-19 DIAGNOSIS — I4891 Unspecified atrial fibrillation: Secondary | ICD-10-CM | POA: Diagnosis not present

## 2021-02-19 DIAGNOSIS — N183 Chronic kidney disease, stage 3 unspecified: Secondary | ICD-10-CM | POA: Diagnosis not present

## 2021-02-19 DIAGNOSIS — R200 Anesthesia of skin: Secondary | ICD-10-CM | POA: Diagnosis not present

## 2021-02-19 DIAGNOSIS — E1122 Type 2 diabetes mellitus with diabetic chronic kidney disease: Secondary | ICD-10-CM | POA: Diagnosis not present

## 2021-02-19 DIAGNOSIS — I5032 Chronic diastolic (congestive) heart failure: Secondary | ICD-10-CM | POA: Diagnosis not present

## 2021-02-19 DIAGNOSIS — G8929 Other chronic pain: Secondary | ICD-10-CM | POA: Diagnosis not present

## 2021-02-21 DIAGNOSIS — G8929 Other chronic pain: Secondary | ICD-10-CM | POA: Diagnosis not present

## 2021-02-21 DIAGNOSIS — E1140 Type 2 diabetes mellitus with diabetic neuropathy, unspecified: Secondary | ICD-10-CM | POA: Diagnosis not present

## 2021-02-21 DIAGNOSIS — R200 Anesthesia of skin: Secondary | ICD-10-CM | POA: Diagnosis not present

## 2021-02-21 DIAGNOSIS — E1122 Type 2 diabetes mellitus with diabetic chronic kidney disease: Secondary | ICD-10-CM | POA: Diagnosis not present

## 2021-02-21 DIAGNOSIS — I130 Hypertensive heart and chronic kidney disease with heart failure and stage 1 through stage 4 chronic kidney disease, or unspecified chronic kidney disease: Secondary | ICD-10-CM | POA: Diagnosis not present

## 2021-02-21 DIAGNOSIS — I5032 Chronic diastolic (congestive) heart failure: Secondary | ICD-10-CM | POA: Diagnosis not present

## 2021-02-21 DIAGNOSIS — I4891 Unspecified atrial fibrillation: Secondary | ICD-10-CM | POA: Diagnosis not present

## 2021-02-21 DIAGNOSIS — D631 Anemia in chronic kidney disease: Secondary | ICD-10-CM | POA: Diagnosis not present

## 2021-02-21 DIAGNOSIS — N183 Chronic kidney disease, stage 3 unspecified: Secondary | ICD-10-CM | POA: Diagnosis not present

## 2021-02-24 ENCOUNTER — Other Ambulatory Visit: Payer: Self-pay

## 2021-02-24 ENCOUNTER — Encounter (HOSPITAL_COMMUNITY): Payer: Self-pay | Admitting: Emergency Medicine

## 2021-02-24 ENCOUNTER — Inpatient Hospital Stay (HOSPITAL_COMMUNITY)
Admission: EM | Admit: 2021-02-24 | Discharge: 2021-02-28 | DRG: 291 | Disposition: A | Discharge status: Home Health | Payer: Medicare HMO | Attending: Family Medicine | Admitting: Family Medicine

## 2021-02-24 ENCOUNTER — Emergency Department (HOSPITAL_COMMUNITY): Payer: Medicare HMO

## 2021-02-24 ENCOUNTER — Inpatient Hospital Stay (HOSPITAL_COMMUNITY): Payer: Medicare HMO

## 2021-02-24 DIAGNOSIS — I5031 Acute diastolic (congestive) heart failure: Secondary | ICD-10-CM | POA: Diagnosis not present

## 2021-02-24 DIAGNOSIS — I5032 Chronic diastolic (congestive) heart failure: Secondary | ICD-10-CM | POA: Diagnosis present

## 2021-02-24 DIAGNOSIS — Z8616 Personal history of COVID-19: Secondary | ICD-10-CM

## 2021-02-24 DIAGNOSIS — Z833 Family history of diabetes mellitus: Secondary | ICD-10-CM | POA: Diagnosis not present

## 2021-02-24 DIAGNOSIS — I130 Hypertensive heart and chronic kidney disease with heart failure and stage 1 through stage 4 chronic kidney disease, or unspecified chronic kidney disease: Principal | ICD-10-CM | POA: Diagnosis present

## 2021-02-24 DIAGNOSIS — Z6833 Body mass index (BMI) 33.0-33.9, adult: Secondary | ICD-10-CM

## 2021-02-24 DIAGNOSIS — I169 Hypertensive crisis, unspecified: Secondary | ICD-10-CM | POA: Diagnosis not present

## 2021-02-24 DIAGNOSIS — I150 Renovascular hypertension: Secondary | ICD-10-CM | POA: Diagnosis not present

## 2021-02-24 DIAGNOSIS — K219 Gastro-esophageal reflux disease without esophagitis: Secondary | ICD-10-CM | POA: Diagnosis present

## 2021-02-24 DIAGNOSIS — Z8673 Personal history of transient ischemic attack (TIA), and cerebral infarction without residual deficits: Secondary | ICD-10-CM | POA: Diagnosis not present

## 2021-02-24 DIAGNOSIS — Z794 Long term (current) use of insulin: Secondary | ICD-10-CM | POA: Diagnosis not present

## 2021-02-24 DIAGNOSIS — E1165 Type 2 diabetes mellitus with hyperglycemia: Secondary | ICD-10-CM | POA: Diagnosis present

## 2021-02-24 DIAGNOSIS — I10 Essential (primary) hypertension: Secondary | ICD-10-CM

## 2021-02-24 DIAGNOSIS — E669 Obesity, unspecified: Secondary | ICD-10-CM | POA: Diagnosis present

## 2021-02-24 DIAGNOSIS — N1831 Chronic kidney disease, stage 3a: Secondary | ICD-10-CM | POA: Diagnosis present

## 2021-02-24 DIAGNOSIS — Z20822 Contact with and (suspected) exposure to covid-19: Secondary | ICD-10-CM | POA: Diagnosis present

## 2021-02-24 DIAGNOSIS — D631 Anemia in chronic kidney disease: Secondary | ICD-10-CM | POA: Diagnosis present

## 2021-02-24 DIAGNOSIS — J9601 Acute respiratory failure with hypoxia: Secondary | ICD-10-CM | POA: Diagnosis present

## 2021-02-24 DIAGNOSIS — R069 Unspecified abnormalities of breathing: Secondary | ICD-10-CM | POA: Diagnosis not present

## 2021-02-24 DIAGNOSIS — E1122 Type 2 diabetes mellitus with diabetic chronic kidney disease: Secondary | ICD-10-CM | POA: Diagnosis present

## 2021-02-24 DIAGNOSIS — R0603 Acute respiratory distress: Secondary | ICD-10-CM | POA: Diagnosis present

## 2021-02-24 DIAGNOSIS — I447 Left bundle-branch block, unspecified: Secondary | ICD-10-CM | POA: Diagnosis present

## 2021-02-24 DIAGNOSIS — I959 Hypotension, unspecified: Secondary | ICD-10-CM | POA: Diagnosis not present

## 2021-02-24 DIAGNOSIS — R0902 Hypoxemia: Secondary | ICD-10-CM | POA: Diagnosis not present

## 2021-02-24 DIAGNOSIS — N184 Chronic kidney disease, stage 4 (severe): Secondary | ICD-10-CM | POA: Diagnosis not present

## 2021-02-24 DIAGNOSIS — I509 Heart failure, unspecified: Secondary | ICD-10-CM | POA: Diagnosis not present

## 2021-02-24 DIAGNOSIS — R0689 Other abnormalities of breathing: Secondary | ICD-10-CM | POA: Diagnosis not present

## 2021-02-24 DIAGNOSIS — Z79899 Other long term (current) drug therapy: Secondary | ICD-10-CM | POA: Diagnosis not present

## 2021-02-24 DIAGNOSIS — I480 Paroxysmal atrial fibrillation: Secondary | ICD-10-CM | POA: Diagnosis not present

## 2021-02-24 DIAGNOSIS — Z8249 Family history of ischemic heart disease and other diseases of the circulatory system: Secondary | ICD-10-CM | POA: Diagnosis not present

## 2021-02-24 DIAGNOSIS — I517 Cardiomegaly: Secondary | ICD-10-CM | POA: Diagnosis not present

## 2021-02-24 DIAGNOSIS — J811 Chronic pulmonary edema: Secondary | ICD-10-CM | POA: Diagnosis not present

## 2021-02-24 DIAGNOSIS — R0602 Shortness of breath: Secondary | ICD-10-CM | POA: Diagnosis not present

## 2021-02-24 DIAGNOSIS — I482 Chronic atrial fibrillation, unspecified: Secondary | ICD-10-CM | POA: Diagnosis present

## 2021-02-24 DIAGNOSIS — Z993 Dependence on wheelchair: Secondary | ICD-10-CM

## 2021-02-24 DIAGNOSIS — R1314 Dysphagia, pharyngoesophageal phase: Secondary | ICD-10-CM | POA: Diagnosis not present

## 2021-02-24 DIAGNOSIS — N183 Chronic kidney disease, stage 3 unspecified: Secondary | ICD-10-CM | POA: Diagnosis not present

## 2021-02-24 DIAGNOSIS — M1990 Unspecified osteoarthritis, unspecified site: Secondary | ICD-10-CM | POA: Diagnosis present

## 2021-02-24 DIAGNOSIS — I110 Hypertensive heart disease with heart failure: Secondary | ICD-10-CM | POA: Diagnosis not present

## 2021-02-24 DIAGNOSIS — E119 Type 2 diabetes mellitus without complications: Secondary | ICD-10-CM

## 2021-02-24 DIAGNOSIS — I5033 Acute on chronic diastolic (congestive) heart failure: Secondary | ICD-10-CM | POA: Diagnosis present

## 2021-02-24 DIAGNOSIS — J80 Acute respiratory distress syndrome: Secondary | ICD-10-CM | POA: Diagnosis not present

## 2021-02-24 HISTORY — DX: Paroxysmal atrial fibrillation: I48.0

## 2021-02-24 HISTORY — DX: Chronic kidney disease, stage 4 (severe): N18.4

## 2021-02-24 HISTORY — DX: Anemia, unspecified: D64.9

## 2021-02-24 HISTORY — DX: Nontraumatic intracerebral hemorrhage, unspecified: I61.9

## 2021-02-24 LAB — BASIC METABOLIC PANEL
Anion gap: 7 (ref 5–15)
BUN: 31 mg/dL — ABNORMAL HIGH (ref 8–23)
CO2: 25 mmol/L (ref 22–32)
Calcium: 8.7 mg/dL — ABNORMAL LOW (ref 8.9–10.3)
Chloride: 108 mmol/L (ref 98–111)
Creatinine, Ser: 1.62 mg/dL — ABNORMAL HIGH (ref 0.44–1.00)
GFR, Estimated: 31 mL/min — ABNORMAL LOW (ref >60)
Glucose, Bld: 224 mg/dL — ABNORMAL HIGH (ref 70–99)
Potassium: 3.5 mmol/L (ref 3.5–5.1)
Sodium: 140 mmol/L (ref 135–145)

## 2021-02-24 LAB — CBC WITH DIFFERENTIAL/PLATELET
Abs Immature Granulocytes: 0.05 10*3/uL (ref 0.00–0.07)
Basophils Absolute: 0.1 10*3/uL (ref 0.0–0.1)
Basophils Relative: 1 %
Eosinophils Absolute: 0.3 10*3/uL (ref 0.0–0.5)
Eosinophils Relative: 2 %
HCT: 35.7 % — ABNORMAL LOW (ref 36.0–46.0)
Hemoglobin: 11.2 g/dL — ABNORMAL LOW (ref 12.0–15.0)
Immature Granulocytes: 0 %
Lymphocytes Relative: 47 %
Lymphs Abs: 7.4 10*3/uL — ABNORMAL HIGH (ref 0.7–4.0)
MCH: 28.8 pg (ref 26.0–34.0)
MCHC: 31.4 g/dL (ref 30.0–36.0)
MCV: 91.8 fL (ref 80.0–100.0)
Monocytes Absolute: 0.8 10*3/uL (ref 0.1–1.0)
Monocytes Relative: 5 %
Neutro Abs: 7.1 10*3/uL (ref 1.7–7.7)
Neutrophils Relative %: 45 %
Platelets: 321 10*3/uL (ref 150–400)
RBC: 3.89 MIL/uL (ref 3.87–5.11)
RDW: 15.9 % — ABNORMAL HIGH (ref 11.5–15.5)
WBC: 15.8 10*3/uL — ABNORMAL HIGH (ref 4.0–10.5)
nRBC: 0 % (ref 0.0–0.2)

## 2021-02-24 LAB — VITAMIN B12: Vitamin B-12: 326 pg/mL (ref 180–914)

## 2021-02-24 LAB — IRON AND TIBC
Iron: 31 ug/dL (ref 28–170)
Saturation Ratios: 11 % (ref 10.4–31.8)
TIBC: 287 ug/dL (ref 250–450)
UIBC: 256 ug/dL

## 2021-02-24 LAB — FOLATE: Folate: 8 ng/mL (ref >5.9)

## 2021-02-24 LAB — RESP PANEL BY RT-PCR (FLU A&B, COVID) ARPGX2
Influenza A by PCR: NEGATIVE
Influenza B by PCR: NEGATIVE
SARS Coronavirus 2 by RT PCR: NEGATIVE

## 2021-02-24 LAB — GLUCOSE, CAPILLARY
Glucose-Capillary: 271 mg/dL — ABNORMAL HIGH (ref 70–99)
Glucose-Capillary: 349 mg/dL — ABNORMAL HIGH (ref 70–99)
Glucose-Capillary: 326 mg/dL — ABNORMAL HIGH (ref 70–99)

## 2021-02-24 LAB — ECHOCARDIOGRAM LIMITED
Height: 64 in
S' Lateral: 2.8 cm
Weight: 3601.43 oz

## 2021-02-24 LAB — HEMOGLOBIN A1C
Hgb A1c MFr Bld: 8.8 % — ABNORMAL HIGH (ref 4.8–5.6)
Mean Plasma Glucose: 205.86 mg/dL

## 2021-02-24 LAB — RETICULOCYTES
Immature Retic Fract: 16.2 % — ABNORMAL HIGH (ref 2.3–15.9)
RBC.: 3.38 MIL/uL — ABNORMAL LOW (ref 3.87–5.11)
Retic Count, Absolute: 81.5 10*3/uL (ref 19.0–186.0)
Retic Ct Pct: 2.4 % (ref 0.4–3.1)

## 2021-02-24 LAB — MAGNESIUM: Magnesium: 2.2 mg/dL (ref 1.7–2.4)

## 2021-02-24 LAB — MRSA NEXT GEN BY PCR, NASAL: MRSA by PCR Next Gen: NOT DETECTED

## 2021-02-24 LAB — TROPONIN I (HIGH SENSITIVITY)
Troponin I (High Sensitivity): 13 ng/L (ref <18)
Troponin I (High Sensitivity): 13 ng/L (ref <18)

## 2021-02-24 LAB — FERRITIN: Ferritin: 67 ng/mL (ref 11–307)

## 2021-02-24 LAB — BRAIN NATRIURETIC PEPTIDE: B Natriuretic Peptide: 150 pg/mL — ABNORMAL HIGH (ref 0.0–100.0)

## 2021-02-24 LAB — PROCALCITONIN: Procalcitonin: <0.1 ng/mL

## 2021-02-24 MED ORDER — SODIUM CHLORIDE 0.9% FLUSH
3.0000 mL | Freq: Two times a day (BID) | INTRAVENOUS | Status: DC
  Administered 2021-02-24 – 2021-02-26 (×5): 3 mL via INTRAVENOUS

## 2021-02-24 MED ORDER — ACETAMINOPHEN 325 MG PO TABS: 650.0000 mg | ORAL_TABLET | Freq: Four times a day (QID) | ORAL | Status: DC | PRN

## 2021-02-24 MED ORDER — DULOXETINE HCL 30 MG PO CPEP
30.0000 mg | ORAL_CAPSULE | Freq: Every day | ORAL | Status: DC
  Administered 2021-02-24 – 2021-02-28 (×5): 30 mg via ORAL
  Filled 2021-02-24 (×5): qty 1

## 2021-02-24 MED ORDER — HYDRALAZINE HCL 25 MG PO TABS
100.0000 mg | ORAL_TABLET | Freq: Three times a day (TID) | ORAL | Status: DC
  Administered 2021-02-24 – 2021-02-28 (×12): 100 mg via ORAL
  Filled 2021-02-24 (×12): qty 4

## 2021-02-24 MED ORDER — SODIUM CHLORIDE 0.9% FLUSH: 3.0000 mL | INTRAVENOUS | Status: DC | PRN

## 2021-02-24 MED ORDER — CHLORHEXIDINE GLUCONATE 0.12 % MT SOLN
15.0000 mL | Freq: Two times a day (BID) | OROMUCOSAL | Status: DC
  Administered 2021-02-24 – 2021-02-28 (×8): 15 mL via OROMUCOSAL
  Filled 2021-02-24 (×8): qty 15

## 2021-02-24 MED ORDER — INSULIN ASPART 100 UNIT/ML IJ SOLN
0.0000 [IU] | Freq: Every day | INTRAMUSCULAR | Status: DC
  Administered 2021-02-24: 4 [IU] via SUBCUTANEOUS

## 2021-02-24 MED ORDER — CHLORHEXIDINE GLUCONATE CLOTH 2 % EX PADS
6.0000 | MEDICATED_PAD | Freq: Every day | CUTANEOUS | Status: DC
  Administered 2021-02-24 – 2021-02-26 (×3): 6 via TOPICAL

## 2021-02-24 MED ORDER — HEPARIN SODIUM (PORCINE) 5000 UNIT/ML IJ SOLN
5000.0000 [IU] | Freq: Three times a day (TID) | INTRAMUSCULAR | Status: DC
  Administered 2021-02-24 – 2021-02-28 (×12): 5000 [IU] via SUBCUTANEOUS
  Filled 2021-02-24 (×12): qty 1

## 2021-02-24 MED ORDER — NITROGLYCERIN IN D5W 200-5 MCG/ML-% IV SOLN
5.0000 ug/min | INTRAVENOUS | Status: DC
  Administered 2021-02-24: 50 ug/min via INTRAVENOUS
  Administered 2021-02-25: 40 ug/min via INTRAVENOUS
  Filled 2021-02-24 (×2): qty 250

## 2021-02-24 MED ORDER — DICLOFENAC SODIUM 1 % EX GEL
2.0000 g | Freq: Four times a day (QID) | CUTANEOUS | Status: DC
  Administered 2021-02-27 (×4): 2 g via TOPICAL
  Filled 2021-02-24 (×2): qty 100

## 2021-02-24 MED ORDER — GABAPENTIN 300 MG PO CAPS
300.0000 mg | ORAL_CAPSULE | Freq: Three times a day (TID) | ORAL | Status: DC
  Administered 2021-02-24 – 2021-02-28 (×12): 300 mg via ORAL
  Filled 2021-02-24 (×12): qty 1

## 2021-02-24 MED ORDER — SODIUM CHLORIDE 0.9 % IV SOLN: 250.0000 mL | INTRAVENOUS | Status: DC | PRN

## 2021-02-24 MED ORDER — ONDANSETRON HCL 4 MG/2ML IJ SOLN: 4.0000 mg | Freq: Four times a day (QID) | INTRAMUSCULAR | Status: DC | PRN

## 2021-02-24 MED ORDER — ONDANSETRON HCL 4 MG PO TABS: 4.0000 mg | ORAL_TABLET | Freq: Four times a day (QID) | ORAL | Status: DC | PRN

## 2021-02-24 MED ORDER — ISOSORBIDE MONONITRATE ER 60 MG PO TB24
60.0000 mg | ORAL_TABLET | Freq: Every day | ORAL | Status: DC
  Filled 2021-02-24: qty 1

## 2021-02-24 MED ORDER — HYDROCORTISONE (PERIANAL) 2.5 % EX CREA
1.0000 "application " | TOPICAL_CREAM | Freq: Two times a day (BID) | CUTANEOUS | Status: DC | PRN
  Filled 2021-02-24: qty 28.35

## 2021-02-24 MED ORDER — FUROSEMIDE 10 MG/ML IJ SOLN
40.0000 mg | Freq: Once | INTRAMUSCULAR | Status: AC
  Administered 2021-02-24: 40 mg via INTRAVENOUS
  Filled 2021-02-24: qty 4

## 2021-02-24 MED ORDER — METOPROLOL SUCCINATE ER 25 MG PO TB24
25.0000 mg | ORAL_TABLET | Freq: Every day | ORAL | Status: DC
  Administered 2021-02-24 – 2021-02-26 (×3): 25 mg via ORAL
  Filled 2021-02-24 (×3): qty 1

## 2021-02-24 MED ORDER — AMLODIPINE BESYLATE 5 MG PO TABS
10.0000 mg | ORAL_TABLET | Freq: Every day | ORAL | Status: DC
  Administered 2021-02-24 – 2021-02-28 (×5): 10 mg via ORAL
  Filled 2021-02-24 (×5): qty 2

## 2021-02-24 MED ORDER — ORAL CARE MOUTH RINSE
15.0000 mL | Freq: Two times a day (BID) | OROMUCOSAL | Status: DC
  Administered 2021-02-24 – 2021-02-27 (×5): 15 mL via OROMUCOSAL

## 2021-02-24 MED ORDER — INSULIN ASPART 100 UNIT/ML IJ SOLN
0.0000 [IU] | Freq: Three times a day (TID) | INTRAMUSCULAR | Status: DC
  Administered 2021-02-24: 11 [IU] via SUBCUTANEOUS
  Administered 2021-02-24: 8 [IU] via SUBCUTANEOUS
  Administered 2021-02-25 (×2): 3 [IU] via SUBCUTANEOUS
  Administered 2021-02-25: 8 [IU] via SUBCUTANEOUS
  Administered 2021-02-26: 3 [IU] via SUBCUTANEOUS
  Administered 2021-02-26 (×2): 2 [IU] via SUBCUTANEOUS
  Administered 2021-02-27 – 2021-02-28 (×2): 3 [IU] via SUBCUTANEOUS

## 2021-02-24 MED ORDER — PANTOPRAZOLE SODIUM 40 MG PO TBEC
40.0000 mg | DELAYED_RELEASE_TABLET | Freq: Every day | ORAL | Status: DC
  Administered 2021-02-24 – 2021-02-28 (×5): 40 mg via ORAL
  Filled 2021-02-24 (×5): qty 1

## 2021-02-24 MED ORDER — FUROSEMIDE 10 MG/ML IJ SOLN
40.0000 mg | Freq: Two times a day (BID) | INTRAMUSCULAR | Status: DC
  Administered 2021-02-24 (×2): 40 mg via INTRAVENOUS
  Filled 2021-02-24 (×2): qty 4

## 2021-02-24 MED ORDER — INSULIN DETEMIR 100 UNIT/ML ~~LOC~~ SOLN
22.0000 [IU] | Freq: Two times a day (BID) | SUBCUTANEOUS | Status: DC
  Administered 2021-02-24 (×2): 22 [IU] via SUBCUTANEOUS
  Filled 2021-02-24 (×6): qty 0.22

## 2021-02-24 MED ORDER — INSULIN ASPART 100 UNIT/ML IJ SOLN
5.0000 [IU] | Freq: Three times a day (TID) | INTRAMUSCULAR | Status: DC
  Administered 2021-02-24 – 2021-02-27 (×8): 5 [IU] via SUBCUTANEOUS

## 2021-02-24 MED ORDER — ACETAMINOPHEN 650 MG RE SUPP: 650.0000 mg | Freq: Four times a day (QID) | RECTAL | Status: DC | PRN

## 2021-02-24 NOTE — Progress Notes (Signed)
*  PRELIMINARY RESULTS* Echocardiogram Limited 2-D Echocardiogram  has been performed.  [name] 02/24/2021, 2:21 PM

## 2021-02-24 NOTE — Progress Notes (Signed)
Patient is currently on 2L [place] with a sat of 95%.  Patient does not appear to be in any distress at this time.  Spoke with RN who will be changing some medication amounts.  Asked RN to call if patient goes into any distress.

## 2021-02-24 NOTE — ED Provider Notes (Signed)
[facility] EMERGENCY DEPARTMENT Provider Note   CSN: [id number] Arrival date & time: 02/24/21  [id number]     History Chief Complaint  Patient presents with   Respiratory Distress    [name] is a 86 y.o. female.  Level 5 caveat secondary to respiratory distress.  Brought in by EMS for shortness of breath since last evening.  Given Solu-Medrol DuoNeb and albuterol neb with minimal improvement.  Diminished breath sounds grunting respirations.  Patient denies chest pain.  Unable to get further history secondary to respiratory distress.  The history is provided by the patient and the EMS personnel.  Shortness of Breath Severity:  Severe Onset quality:  Gradual Duration:  18 hours Timing:  Constant Progression:  Worsening Relieved by:  None tried Worsened by:  Activity Ineffective treatments:  None tried Associated symptoms: no abdominal pain and no chest pain       Past Medical History:  Diagnosis Date   Arthritis    CHF (congestive heart failure) (HCC)    Chronic diastolic heart failure (HCC)    CKD (chronic kidney disease) stage 3, GFR 30-59 ml/min (HCC)    Essential hypertension    GERD (gastroesophageal reflux disease)    Hemorrhoids    History of stroke    Type 2 diabetes mellitus ([place])     Patient Active Problem List   Diagnosis Date Noted   Acute respiratory distress 10/11/2020   Sinus tachycardia    Respiratory distress    Uncontrolled type 2 diabetes mellitus with hyperglycemia ([place])    Controlled type 2 diabetes mellitus with hyperglycemia, with long-term current use of insulin ([place])    Intraparenchymal hemorrhage of brain ([place]) 09/30/2020   ICH (intracerebral hemorrhage) ([place]) 09/24/2020   Pneumonia due to COVID-19 virus 02/12/2020   Acute hypoxemic respiratory failure due to COVID-19 [facility]) 02/08/2020   Educated about COVID-19 virus infection 01/11/2020   Acute urinary retention    Acute pulmonary edema ([place])    AKI (acute kidney injury) ([place])  07/25/2019   Acute respiratory failure with hypercapnia ([place]) 07/24/2019   Pancreatitis, acute 07/23/2019   Type 2 diabetes mellitus ([place]) 07/23/2019   Atrial fibrillation, chronic (HCC) 07/23/2019   Chronic diastolic CHF (congestive heart failure) (HCC)/EF > 60 % 07/23/2019   Shortness of breath 06/19/2019   Acute on chronic diastolic CHF (congestive heart failure) ([place]) 07/08/2017   Acute respiratory failure with hypoxemia ([place]) 07/08/2017   Uncontrolled type 2 diabetes mellitus with diabetic nephropathy, with long-term current use of insulin 07/08/2017   CKD (chronic kidney disease), stage IIIb 07/08/2017   Essential hypertension 07/08/2017   Hypertension 06/23/2017   Prolapsed internal hemorrhoids, grade 3 06/25/2016   Abdominal pain 05/08/2015   Dysphagia, pharyngoesophageal phase    Other hemorrhoids    Diverticulosis of colon without hemorrhage    GERD (gastroesophageal reflux disease) 02/28/2014   Constipation 02/28/2014   Esophageal dysphagia 02/28/2014   Rectal bleeding 02/28/2014   OSTEOARTHRITIS, KNEE, RIGHT, SEVERE 12/13/2008   DEGENERATIVE [place] DISEASE, LUMBOSACRAL SPINE W/RADICULOPATHY 12/13/2008   BACK PAIN 12/13/2008   DIABETES 04/13/2007   FRACTURE, FEMUR, INTERTROCHANTERIC REGION 04/13/2007    Past Surgical History:  Procedure Laterality Date   BACK SURGERY     CATARACT EXTRACTION W/PHACO Left 02/28/2013   Procedure: CATARACT EXTRACTION PHACO AND INTRAOCULAR LENS PLACEMENT (IOL) CDE=15.60;  Surgeon: Elta Guadeloupe T. Gershon Crane, MD;  Location: [facility] OR[facility];  Service: Ophthalmology;  Laterality: Left;   CHOLECYSTECTOMY     COLONOSCOPY  12/12/2003   RMR: Normal rectum.  Long capacious tortuous colon, colonic mucosa appeared normal   COLONOSCOPY N/A 03/21/2014   Dr. [name]: internal and external hemorrhoids, torturous colon, colonic diverticulosis    ESOPHAGOGASTRODUODENOSCOPY  12/28/2001   [id number] sliding hiatal hernia/. Three small bulbar ulcers, two with stigmata of bleeding  and these were coagulated using heater probe unit    ESOPHAGOGASTRODUODENOSCOPY N/A 03/21/2014   Dr. [name]: cervical esophageal web s/p dilation, negative H.pylori    EYE SURGERY     HIP FRACTURE SURGERY  2008   JOINT REPLACEMENT     Rt hip, ????? sounds like just for fracture   MALONEY DILATION N/A 03/21/2014   Procedure: [name] DILATION;  Surgeon: [name], MD;  Location: [facility] ENDO SUITE;  Service: Endoscopy;  Laterality: N/A;   SAVORY DILATION N/A 03/21/2014   Procedure: SAVORY DILATION;  Surgeon: [name], MD;  Location: [facility] END[facility];  Service: Endoscopy;  Laterality: N/A;     OB History   No obstetric history on file.     Family History  Problem Relation Age of Onset   Crohn's disease Daughter    Hypertension Mother    Hypertension Sister    Diabetes Brother    Diabetes Sister    Diabetes Sister    Heart attack Sister    Colon cancer Neg Hx     Social History   Tobacco Use   Smoking status: Never   Smokeless tobacco: Never  Vaping Use   Vaping Use: Never used  Substance Use Topics   Alcohol use: No   Drug use: No    Home Medications Prior to Admission medications   Medication Sig Start Date End Date Taking? Authorizing Provider  acetaminophen (TYLENOL) 325 MG tablet Take 2 tablets (650 mg total) by mouth every 6 (six) hours as needed for mild pain, fever or headache (or Fever >/= 101). 07/31/19   Roxan Hockey, MD  amLODipine (NORVASC) 10 MG tablet Take 1 tablet (10 mg total) by mouth daily. 09/29/20   Patrecia Pour, MD  diclofenac Sodium (VOLTAREN) 1 % GEL Apply 2 g topically 4 (four) times daily. 10/11/20   Angiulli, Lavon Paganini, PA-C  DULoxetine (CYMBALTA) 30 MG capsule Take 1 capsule (30 mg total) by mouth daily. 10/11/20   Angiulli, Lavon Paganini, PA-C  furosemide (LASIX) 40 MG tablet Take 1 tablet (40 mg total) by mouth daily. 10/17/20   Dessa Phi, DO  gabapentin (NEURONTIN) 300 MG capsule Take 300 mg by mouth 3 (three) times daily.    [provider]  hydrALAZINE (APRESOLINE) 100 MG tablet Take 1 tablet (100 mg total) by mouth 3 (three) times daily. 09/30/20   Patrecia Pour, MD  hydrocortisone (ANUSOL-HC) 2.5 % rectal cream Place 1 application rectally 2 (two) times daily as needed for hemorrhoids or anal itching. 01/03/20   Barton Dubois, MD  insulin aspart (NOVOLOG) 100 UNIT/ML FlexPen Inject 0-10 Units into the skin 3 (three) times daily with meals. insulin aspart (novoLOG) injection 0-10 Units 0-10 Units Subcutaneous, 3 times daily with meals CBG < 70: Implement Hypoglycemia Standing Orders and refer to Hypoglycemia Standing Orders sidebar report  CBG 70 - 120: 0 unit CBG 121 - 150: 0 unit  CBG 151 - 200: 1 unit CBG 201 - 250: 2 units CBG 251 - 300: 4 units CBG 301 - 350: 6 units  CBG 351 - 400: 8 units  CBG > 400: 10 units 07/31/19   Emokpae, Courage, MD  insulin aspart (NOVOLOG) 100 UNIT/ML injection Inject  5 Units into the skin 3 (three) times daily with meals. 10/11/20   Angiulli, Lavon Paganini, PA-C  insulin detemir (LEVEMIR) 100 UNIT/ML injection Inject 0.22 mLs (22 Units total) into the skin 2 (two) times daily. 10/16/20   Dessa Phi, DO  isosorbide mononitrate (IMDUR) 60 MG 24 hr tablet Take 1 tablet (60 mg total) by mouth daily. 10/11/20 10/11/21  Angiulli, Lavon Paganini, PA-C  metoprolol succinate (TOPROL-XL) 25 MG 24 hr tablet Take 1 tablet (25 mg total) by mouth daily. 10/17/20   Dessa Phi, DO  ondansetron (ZOFRAN) 4 MG tablet Take 4 mg by mouth every 6 (six) hours as needed for nausea or vomiting.    [provider]  pantoprazole (PROTONIX) 40 MG tablet Take 1 tablet by mouth daily. 09/06/20   [provider]    Allergies    Patient has no known allergies.  Review of Systems   Review of Systems  Unable to perform ROS: Severe respiratory distress  Respiratory:  Positive for shortness of breath.   Cardiovascular:  Negative for chest pain.  Gastrointestinal:  Negative for abdominal pain.   Physical  Exam Updated Vital Signs BP (!) 184/151   Pulse (!) 119   Resp (!) 26   Ht '5\' 5"'$  (1.651 m)   Wt 92 kg   SpO2 92%   BMI 33.75 kg/m   Physical Exam Vitals and nursing note reviewed.  Constitutional:      General: She is in acute distress.     Appearance: Normal appearance. She is well-developed. She is obese.  HENT:     Head: Normocephalic and atraumatic.  Eyes:     Conjunctiva/sclera: Conjunctivae normal.  Cardiovascular:     Rate and Rhythm: Regular rhythm. Tachycardia present.     Heart sounds: No murmur heard. Pulmonary:     Effort: Tachypnea, accessory muscle usage and respiratory distress present.     Breath sounds: Decreased breath sounds present.  Abdominal:     Palpations: Abdomen is soft.     Tenderness: There is no abdominal tenderness. There is no guarding or rebound.  Musculoskeletal:        General: Normal range of motion.     Cervical back: Neck supple.     Right lower leg: Edema present.     Left lower leg: Edema present.  Skin:    General: Skin is warm and dry.  Neurological:     General: No focal deficit present.     Mental Status: She is alert.    ED Results / Procedures / Treatments   Labs (all labs ordered are listed, but only abnormal results are displayed) Labs Reviewed  BASIC METABOLIC PANEL - Abnormal; Notable for the following components:      Result Value   Glucose, Bld 224 (*)    BUN 31 (*)    Creatinine, Ser 1.62 (*)    Calcium 8.7 (*)    GFR, Estimated 31 (*)    All other components within normal limits  BRAIN NATRIURETIC PEPTIDE - Abnormal; Notable for the following components:   B Natriuretic Peptide 150.0 (*)    All other components within normal limits  CBC WITH DIFFERENTIAL/PLATELET - Abnormal; Notable for the following components:   WBC 15.8 (*)    Hemoglobin 11.2 (*)    HCT 35.7 (*)    RDW 15.9 (*)    Lymphs Abs 7.4 (*)    All other components within normal limits  HEMOGLOBIN A1C - Abnormal; Notable for the following  components:   Hgb A1c MFr Bld 8.8 (*)    All other components within normal limits  RETICULOCYTES - Abnormal; Notable for the following components:   RBC. 3.38 (*)    Immature Retic Fract 16.2 (*)    All other components within normal limits  GLUCOSE, CAPILLARY - Abnormal; Notable for the following components:   Glucose-Capillary 271 (*)    All other components within normal limits  GLUCOSE, CAPILLARY - Abnormal; Notable for the following components:   Glucose-Capillary 349 (*)    All other components within normal limits  GLUCOSE, CAPILLARY - Abnormal; Notable for the following components:   Glucose-Capillary 326 (*)    All other components within normal limits  RESP PANEL BY RT-PCR (FLU A&B, COVID) ARPGX2  MRSA NEXT GEN BY PCR, NASAL  MAGNESIUM  PROCALCITONIN  VITAMIN B12  FOLATE  IRON AND TIBC  FERRITIN  MAGNESIUM  BASIC METABOLIC PANEL  CBC  TROPONIN I (HIGH SENSITIVITY)  TROPONIN I (HIGH SENSITIVITY)    EKG EKG Interpretation  Date/Time:  Monday February 24 2021 08:45:46 EDT Ventricular Rate:  108 PR Interval:  174 QRS Duration: 137 QT Interval:  359 QTC Calculation: 482 R Axis:   -10 Text Interpretation: Sinus tachycardia Left bundle branch block No significant change since prior 5/22 Confirmed by Aletta Edouard [phone]) on 02/24/2021 8:49:06 AM  Radiology DG Chest Port 1 View  Result Date: 02/24/2021 CLINICAL DATA:  Shortness of breath.  Respiratory distress. EXAM: PORTABLE CHEST 1 VIEW COMPARISON:  Radiographs 10/12/2020 and 10/11/2020.  CT 07/23/2019. FINDINGS: 0918 hours. Stable cardiomegaly. There is recurrent pulmonary edema without confluent airspace opacity, significant pleural effusion or pneumothorax. Prominent left lower thoracic paraspinal osteophyte appears unchanged. No acute osseous findings. Telemetry leads overlie the chest. IMPRESSION: Cardiomegaly with recurrent pulmonary edema consistent with congestive heart failure. Electronically Signed   By:  Richardean Sale M.D.   On: 02/24/2021 09:49   ECHOCARDIOGRAM LIMITED  Result Date: 02/24/2021    ECHOCARDIOGRAM LIMITED REPORT   Patient Name:   [name] Date of Exam: 02/24/2021 Medical Rec #:  [id number]    Height:       64.0 in Accession #:    [id number]   Weight:       225.1 lb Date of Birth:  Mar 24, 1935     BSA:          2.057 m Patient Age:    33 years     BP:           161/36 mmHg Patient Gender: F            HR:           80 bpm. Exam Location:  [facility] Procedure: Limited Echo Indications:    CHF-Acute Diastolic [id number]  History:        Patient has prior history of Echocardiogram examinations, most                 recent 09/25/2020. CHF, Stroke, Arrythmias:Atrial Fibrillation;                 Risk Factors:Hypertension and Diabetes. Hx of Pneumonia due to                 COVID-19 virus.  Sonographer:    Alvino Chapel RCS Referring Phys: [id number] Royanne Foots [facility]  Sonographer Comments: Patient on BiPAP throughout echo exam. Could not sniff on BiPAP. IMPRESSIONS  1. Left ventricular ejection fraction, by estimation, is 60 to 65%. The left  ventricle has normal function. The left ventricle has no regional wall motion abnormalities. There is moderate left ventricular hypertrophy.  2. Right ventricular systolic function is normal. The right ventricular size is mildly enlarged. FINDINGS  Left Ventricle: Left ventricular ejection fraction, by estimation, is 60 to 65%. The left ventricle has normal function. The left ventricle has no regional wall motion abnormalities. The left ventricular internal cavity size was normal in size. There is  moderate left ventricular hypertrophy. Right Ventricle: The right ventricular size is mildly enlarged. No increase in right ventricular wall thickness. Right ventricular systolic function is normal. Pericardium: Trivial pericardial effusion is present. Aorta: The aortic root is normal in size and structure. LEFT VENTRICLE PLAX 2D LVIDd:         4.60 cm LVIDs:         2.80 cm LV  PW:         1.10 cm LV IVS:        1.10 cm LVOT diam:     1.80 cm LVOT Area:     2.54 cm  LEFT ATRIUM         Index LA diam:    3.70 cm 1.80 cm/m   AORTA Ao Root diam: 3.00 cm  SHUNTS Systemic Diam: 1.80 cm Oswaldo Milian MD Electronically signed by Oswaldo Milian MD Signature Date/Time: 02/24/2021/6:47:41 PM    Final     Procedures .Critical Care Performed by: Hayden Rasmussen, MD Authorized by: Hayden Rasmussen, MD   Critical care provider statement:    Critical care time (minutes):  45   Critical care time was exclusive of:  Separately billable procedures and treating other patients   Critical care was necessary to treat or prevent imminent or life-threatening deterioration of the following conditions:  Cardiac failure and respiratory failure   Critical care was time spent personally by me on the following activities:  Development of treatment plan with patient or surrogate, discussions with consultants, evaluation of patient's response to treatment, examination of patient, obtaining history from patient or surrogate, ordering and performing treatments and interventions, ordering and review of laboratory studies, ordering and review of radiographic studies, pulse oximetry, re-evaluation of patient's condition and review of old charts   Medications Ordered in ED Medications  nitroGLYCERIN 50 mg in dextrose 5 % 250 mL (0.2 mg/mL) infusion (30 mcg/min Intravenous Rate/Dose Change 02/24/21 2011)  amLODipine (NORVASC) tablet 10 mg (10 mg Oral Given 02/24/21 1427)  hydrALAZINE (APRESOLINE) tablet 100 mg (100 mg Oral Given 02/24/21 1530)  metoprolol succinate (TOPROL-XL) 24 hr tablet 25 mg (25 mg Oral Given 02/24/21 1426)  DULoxetine (CYMBALTA) DR capsule 30 mg (30 mg Oral Given 02/24/21 1426)  insulin aspart (novoLOG) injection 5 Units (5 Units Subcutaneous Given 02/24/21 1650)  insulin detemir (LEVEMIR) injection 22 Units (22 Units Subcutaneous Given 02/24/21 2109)  pantoprazole  (PROTONIX) EC tablet 40 mg (40 mg Oral Given 02/24/21 1426)  gabapentin (NEURONTIN) capsule 300 mg (300 mg Oral Given 02/24/21 1530)  hydrocortisone (ANUSOL-HC) 2.5 % rectal cream 1 application (has no administration in time range)  diclofenac Sodium (VOLTAREN) 1 % topical gel 2 g (2 g Topical Patient Refused/Not Given 02/24/21 1800)  heparin injection 5,000 Units (5,000 Units Subcutaneous Given 02/24/21 1407)  sodium chloride flush (NS) 0.9 % injection 3 mL (3 mLs Intravenous Given 02/24/21 1249)  sodium chloride flush (NS) 0.9 % injection 3 mL (has no administration in time range)  0.9 %  sodium chloride infusion (has no administration in time range)  acetaminophen (TYLENOL) tablet 650 mg (has no administration in time range)    Or  acetaminophen (TYLENOL) suppository 650 mg (has no administration in time range)  ondansetron (ZOFRAN) tablet 4 mg (has no administration in time range)    Or  ondansetron (ZOFRAN) injection 4 mg (has no administration in time range)  furosemide (LASIX) injection 40 mg (40 mg Intravenous Given 02/24/21 1249)  insulin aspart (novoLOG) injection 0-15 Units (11 Units Subcutaneous Given 02/24/21 1650)  insulin aspart (novoLOG) injection 0-5 Units (4 Units Subcutaneous Given 02/24/21 2108)  Chlorhexidine Gluconate Cloth 2 % PADS 6 each (6 each Topical Given 02/24/21 1211)  chlorhexidine (PERIDEX) 0.12 % solution 15 mL (has no administration in time range)  MEDLINE mouth rinse (15 mLs Mouth Rinse Given 02/24/21 1622)  furosemide (LASIX) injection 40 mg (40 mg Intravenous Given 02/24/21 0856)    ED Course  I have reviewed the triage vital signs and the nursing notes.  Pertinent labs & imaging results that were available during my care of the patient were reviewed by me and considered in my medical decision making (see chart for details).  Clinical Course as of 02/24/21 2109  Mon Feb 24, 2021  0846 Patient placed on BiPAP.  Have ordered Lasix and nitroglycerin  drip [MB]  [id number] Chest x-ray showing cardiomegaly and likely pulmonary edema.  Awaiting radiology reading [MB]  [id number] Reevaluation after BiPAP initiated.  She is breathing much more comfortably and is resting. [MB]  1014 Patient's daughter is here now.  She said patient has been having increased shortness of breath over the course of about a week.  This morning she complained of some chest pain and tightness. [MB]  [id number] Discussed with Dr. Manuella Ghazi [facility] hospitalist to evaluate the patient for admission. [MB]    Clinical Course User Index [MB] Hayden Rasmussen, MD   MDM Rules/Calculators/A&P                          [name] was evaluated in Emergency Department on 02/24/2021 for the symptoms described in the history of present illness. She was evaluated in the context of the global COVID-19 pandemic, which necessitated consideration that the patient might be at risk for infection with the SARS-CoV-2 virus that causes COVID-19. Institutional protocols and algorithms that pertain to the evaluation of patients at risk for COVID-19 are in a state of rapid change based on information released by regulatory bodies including the CDC and federal and state organizations. These policies and algorithms were followed during the patient's care in the ED.  This patient complains of acute shortness of breath; this involves an extensive number of treatment Options and is a complaint that carries with it a high risk of complications and Morbidity. The differential includes CHF, COPD, pneumonia, COVID, pneumothorax, ACS, PE  I ordered, reviewed and interpreted labs, which included CBC with elevated white count, hemoglobinSlightly elevated from baseline, chemistries with elevated glucose BUN and creatinine similar to priors, COVID testing negative, troponins flat, BNP mildly elevated I ordered medication IV Lasix IV nitroglycerin I ordered imaging studies which included chest x-ray and I independently    visualized  and interpreted imaging which showed CHF Additional history obtained from patient's daughter and EMS Previous records obtained and reviewed in epic no recent admissions I consulted Dr. Manuella Ghazi and discussed lab and imaging findings  Critical Interventions: Management of patient's respiratory distress with early intervention of BiPAP nitroglycerin Lasix  After the interventions stated above,  I reevaluated the patient and found patient's respiratory distress to be improved.  She will need admission to the hospital for further management.   Final Clinical Impression(s) / ED Diagnoses Final diagnoses:  Acute respiratory distress  Acute congestive heart failure, unspecified heart failure type [facility])    Rx / DC Orders ED Discharge Orders     None        Hayden Rasmussen, MD 02/24/21 2115

## 2021-02-24 NOTE — H&P (Signed)
History and Physical    [name] [id number] DOB: 08-Nov-1934 DOA: 02/24/2021  PCP: [name], MD   Patient coming from: Home  Chief Complaint: Shortness of breath  HPI: [name] is a 86 y.o. female with medical history significant for chronic diastolic CHF, obesity, CKD stage IV, type 2 diabetes, hypertension, and GERD who presented to the ED via EMS with worsening respiratory distress over the last 1-2 days.  She was initially noted to be slightly hypoxemic and was given some home supplemental oxygen by family members with some improvement.  She is noted to have some worsening cough and congestion today per EMS and was using accessory muscles to breathe.  No fevers or chills noted.  She has been compliant with her home medications according to daughter at bedside, but may have been eating some more takeout and high sodium foods.  Patient's baseline weight is near 200 pounds and daughter states that she has gained about 3-5 pounds recently.   ED Course: Vital signs stable and patient is afebrile.  She is currently on BiPAP with FiO2 60%.  Leukocytosis of 15,800 noted and hemoglobin 11.2.  BUN 31 creatinine 1.62.  Glucose 224.  Chest x-ray with cardiomegaly and pulmonary edema noted.  BNP 150.  She has been started on nitroglycerin drip due to elevated blood pressures as well as heart failure and has been given 1 dose of IV Lasix with no significant urine output noted as of yet.  Review of Systems: Reviewed as noted above, otherwise negative.  Past Medical History:  Diagnosis Date   Arthritis    CHF (congestive heart failure) (HCC)    Chronic diastolic heart failure (HCC)    CKD (chronic kidney disease) stage 3, GFR 30-59 ml/min (HCC)    Essential hypertension    GERD (gastroesophageal reflux disease)    Hemorrhoids    History of stroke    Type 2 diabetes mellitus ([place])     Past Surgical History:  Procedure Laterality Date   BACK SURGERY     CATARACT EXTRACTION W/PHACO  Left 02/28/2013   Procedure: CATARACT EXTRACTION PHACO AND INTRAOCULAR LENS PLACEMENT (IOL) CDE=15.60;  Surgeon: Elta Guadeloupe T. Gershon Crane, MD;  Location: [facility];  Service: Ophthalmology;  Laterality: Left;   CHOLECYSTECTOMY     COLONOSCOPY  12/12/2003   RMR: Normal rectum.  Long capacious tortuous colon, colonic mucosa appeared normal   COLONOSCOPY N/A 03/21/2014   Dr. [name]: internal and external hemorrhoids, torturous colon, colonic diverticulosis    ESOPHAGOGASTRODUODENOSCOPY  12/28/2001   [id number] sliding hiatal hernia/. Three small bulbar ulcers, two with stigmata of bleeding and these were coagulated using heater probe unit    ESOPHAGOGASTRODUODENOSCOPY N/A 03/21/2014   Dr. [name]: cervical esophageal web s/p dilation, negative H.pylori    EYE SURGERY     HIP FRACTURE SURGERY  2008   JOINT REPLACEMENT     Rt hip, ????? sounds like just for fracture   MALONEY DILATION N/A 03/21/2014   Procedure: [name] DILATION;  Surgeon: [name], MD;  Location: [facility];  Service: Endoscopy;  Laterality: N/A;   SAVORY DILATION N/A 03/21/2014   Procedure: SAVORY DILATION;  Surgeon: [name], MD;  Location: [facility];  Service: Endoscopy;  Laterality: N/A;     reports that she has never smoked. She has never used smokeless tobacco. She reports that she does not drink alcohol and does not use drugs.  No Known Allergies  Family History  Problem Relation Age of Onset  Crohn's disease Daughter    Hypertension Mother    Hypertension Sister    Diabetes Brother    Diabetes Sister    Diabetes Sister    Heart attack Sister    Colon cancer Neg Hx     Prior to Admission medications   Medication Sig Start Date End Date Taking? Authorizing Provider  acetaminophen (TYLENOL) 325 MG tablet Take 2 tablets (650 mg total) by mouth every 6 (six) hours as needed for mild pain, fever or headache (or Fever >/= 101). 07/31/19   Roxan Hockey, MD  amLODipine (NORVASC) 10 MG tablet Take 1 tablet (10 mg  total) by mouth daily. 09/29/20   Patrecia Pour, MD  diclofenac Sodium (VOLTAREN) 1 % GEL Apply 2 g topically 4 (four) times daily. 10/11/20   Angiulli, Lavon Paganini, PA-C  DULoxetine (CYMBALTA) 30 MG capsule Take 1 capsule (30 mg total) by mouth daily. 10/11/20   Angiulli, Lavon Paganini, PA-C  furosemide (LASIX) 40 MG tablet Take 1 tablet (40 mg total) by mouth daily. 10/17/20   Dessa Phi, DO  gabapentin (NEURONTIN) 300 MG capsule Take 300 mg by mouth 3 (three) times daily.    [provider]  hydrALAZINE (APRESOLINE) 100 MG tablet Take 1 tablet (100 mg total) by mouth 3 (three) times daily. 09/30/20   Patrecia Pour, MD  hydrocortisone (ANUSOL-HC) 2.5 % rectal cream Place 1 application rectally 2 (two) times daily as needed for hemorrhoids or anal itching. 01/03/20   Barton Dubois, MD  insulin aspart (NOVOLOG) 100 UNIT/ML FlexPen Inject 0-10 Units into the skin 3 (three) times daily with meals. insulin aspart (novoLOG) injection 0-10 Units 0-10 Units Subcutaneous, 3 times daily with meals CBG < 70: Implement Hypoglycemia Standing Orders and refer to Hypoglycemia Standing Orders sidebar report  CBG 70 - 120: 0 unit CBG 121 - 150: 0 unit  CBG 151 - 200: 1 unit CBG 201 - 250: 2 units CBG 251 - 300: 4 units CBG 301 - 350: 6 units  CBG 351 - 400: 8 units  CBG > 400: 10 units 07/31/19   Emokpae, Courage, MD  insulin aspart (NOVOLOG) 100 UNIT/ML injection Inject 5 Units into the skin 3 (three) times daily with meals. 10/11/20   Angiulli, Lavon Paganini, PA-C  insulin detemir (LEVEMIR) 100 UNIT/ML injection Inject 0.22 mLs (22 Units total) into the skin 2 (two) times daily. 10/16/20   Dessa Phi, DO  isosorbide mononitrate (IMDUR) 60 MG 24 hr tablet Take 1 tablet (60 mg total) by mouth daily. 10/11/20 10/11/21  Angiulli, Lavon Paganini, PA-C  metoprolol succinate (TOPROL-XL) 25 MG 24 hr tablet Take 1 tablet (25 mg total) by mouth daily. 10/17/20   Dessa Phi, DO  ondansetron (ZOFRAN) 4 MG tablet Take 4 mg by mouth every 6  (six) hours as needed for nausea or vomiting.    [provider]  pantoprazole (PROTONIX) 40 MG tablet Take 1 tablet by mouth daily. 09/06/20   [provider]    Physical Exam: Vitals:   02/24/21 0910 02/24/21 0910 02/24/21 0930 02/24/21 1000  BP: 133/66  (!) 147/61 (!) 144/63  Pulse: 95  85 81  Resp: 20  (!) 24 20  Temp:  97.7 F (36.5 C)    TempSrc:  Axillary    SpO2: 98%  98% 98%  Weight:      Height:        Constitutional: NAD, currently on BiPAP, obese Vitals:   02/24/21 0910 02/24/21 0910 02/24/21 0930 02/24/21 1000  BP: 133/66  (!) 147/61 (!) 144/63  Pulse: 95  85 81  Resp: 20  (!) 24 20  Temp:  97.7 F (36.5 C)    TempSrc:  Axillary    SpO2: 98%  98% 98%  Weight:      Height:       Eyes: lids and conjunctivae normal Neck: normal, supple Respiratory: Diminished to auscultation bilaterally, currently on BiPAP FiO2 60% Cardiovascular: Regular rate and rhythm, no murmurs. Abdomen: no tenderness, no distention. Bowel sounds positive.  Musculoskeletal: Scant edema. Skin: no rashes, lesions, ulcers.  Psychiatric: Flat affect  Labs on Admission: I have personally reviewed following labs and imaging studies  CBC: Recent Labs  Lab 02/24/21 0855  WBC 15.8*  NEUTROABS 7.1  HGB 11.2*  HCT 35.7*  MCV 91.8  PLT [id number]   Basic Metabolic Panel: Recent Labs  Lab 02/24/21 0855  NA 140  K 3.5  CL 108  CO2 25  GLUCOSE 224*  BUN 31*  CREATININE 1.62*  CALCIUM 8.7*  MG 2.2   GFR: Estimated Creatinine Clearance: 28.8 mL/min (A) (by C-G formula based on SCr of 1.62 mg/dL (H)). Liver Function Tests: No results for input(s): AST, ALT, ALKPHOS, BILITOT, PROT, ALBUMIN in the last 168 hours. No results for input(s): LIPASE, AMYLASE in the last 168 hours. No results for input(s): AMMONIA in the last 168 hours. Coagulation Profile: No results for input(s): INR, PROTIME in the last 168 hours. Cardiac Enzymes: No results for input(s): CKTOTAL, CKMB,  CKMBINDEX, TROPONINI in the last 168 hours. BNP (last 3 results) No results for input(s): PROBNP in the last 8760 hours. HbA1C: No results for input(s): HGBA1C in the last 72 hours. CBG: No results for input(s): GLUCAP in the last 168 hours. Lipid Profile: No results for input(s): CHOL, HDL, LDLCALC, TRIG, CHOLHDL, LDLDIRECT in the last 72 hours. Thyroid Function Tests: No results for input(s): TSH, T4TOTAL, FREET4, T3FREE, THYROIDAB in the last 72 hours. Anemia Panel: No results for input(s): VITAMINB12, FOLATE, FERRITIN, TIBC, IRON, RETICCTPCT in the last 72 hours. Urine analysis:    Component Value Date/Time   COLORURINE YELLOW 09/25/2020 0806   APPEARANCEUR HAZY (A) 09/25/2020 0806   LABSPEC 1.024 09/25/2020 0806   PHURINE 5.0 09/25/2020 0806   GLUCOSEU >=500 (A) 09/25/2020 0806   HGBUR NEGATIVE 09/25/2020 0806   BILIRUBINUR NEGATIVE 09/25/2020 0806   KETONESUR NEGATIVE 09/25/2020 0806   PROTEINUR 100 (A) 09/25/2020 0806   UROBILINOGEN 0.2 02/20/2015 1200   NITRITE NEGATIVE 09/25/2020 0806   LEUKOCYTESUR NEGATIVE 09/25/2020 0806    Radiological Exams on Admission: DG Chest Port 1 View  Result Date: 02/24/2021 CLINICAL DATA:  Shortness of breath.  Respiratory distress. EXAM: PORTABLE CHEST 1 VIEW COMPARISON:  Radiographs 10/12/2020 and 10/11/2020.  CT 07/23/2019. FINDINGS: 0918 hours. Stable cardiomegaly. There is recurrent pulmonary edema without confluent airspace opacity, significant pleural effusion or pneumothorax. Prominent left lower thoracic paraspinal osteophyte appears unchanged. No acute osseous findings. Telemetry leads overlie the chest. IMPRESSION: Cardiomegaly with recurrent pulmonary edema consistent with congestive heart failure. Electronically Signed   By: Richardean Sale M.D.   On: 02/24/2021 09:49    EKG: Independently reviewed. ST 108bpm with LBBB.  Assessment/Plan Active Problems:   Acute CHF ([place])    Acute hypoxemic respiratory failure secondary to  acute on chronic diastolic CHF exacerbation -Previous echo on 09/2020 with LVEF 55-60% with grade 2 diastolic dysfunction -Obtain limited echocardiogram today -Strict I's and O's -Fluid restriction -Daily weights, with baseline near 200 pounds-which she  appears to be near -Wean off BiPAP as tolerated -Continue nitroglycerin drip -Continue IV Lasix '40mg'$  twice daily -Cardiology consultation appreciated  Uncontrolled hypertension -Continue amlodipine, hydralazine, Imdur -Started on nitroglycerin drip as noted above and wean off -Continue monitor on IV Lasix for diuresis  Type 2 diabetes with hyperglycemia -Maintain on insulin and SSI  Anemia -Anemia panel -Monitor CBC  Obesity -Lifestyle changes outpatient  Debility -Wheelchair-bound at home -Does not want to go back to SNF  DVT prophylaxis: Heparin Code Status: DNI Family Communication: Daughter at bedside Disposition Plan: Admit for acute CHF exacerbation Consults called: Cardiology Admission status: Inpatient, stepdown unit  Lakeva Hollon D Rorey Bisson DO [facility]  If 7PM-7AM, please contact night-coverage [email or web address]  02/24/2021, 10:43 AM

## 2021-02-24 NOTE — ED Triage Notes (Signed)
Pt to ER via EMS from home with c/o respiratory distress.  Per EMS pt with diminished lung sounds in lower lobes.  EMS administered '125mg'$  solumedrol, 1 duoneb and 1 albuterol which continues on arrival.  Pt continues with accessory muscle use and retractions.  Pt with grunting respirations.  MD at bedside.

## 2021-02-25 ENCOUNTER — Encounter (HOSPITAL_COMMUNITY): Payer: Self-pay | Admitting: Internal Medicine

## 2021-02-25 DIAGNOSIS — I5031 Acute diastolic (congestive) heart failure: Secondary | ICD-10-CM | POA: Diagnosis not present

## 2021-02-25 DIAGNOSIS — N184 Chronic kidney disease, stage 4 (severe): Secondary | ICD-10-CM

## 2021-02-25 DIAGNOSIS — J9601 Acute respiratory failure with hypoxia: Secondary | ICD-10-CM

## 2021-02-25 DIAGNOSIS — I10 Essential (primary) hypertension: Secondary | ICD-10-CM

## 2021-02-25 DIAGNOSIS — I480 Paroxysmal atrial fibrillation: Secondary | ICD-10-CM

## 2021-02-25 LAB — TSH: TSH: 3.675 u[IU]/mL (ref 0.350–4.500)

## 2021-02-25 LAB — MAGNESIUM: Magnesium: 2.1 mg/dL (ref 1.7–2.4)

## 2021-02-25 LAB — CBC
HCT: 28.6 % — ABNORMAL LOW (ref 36.0–46.0)
Hemoglobin: 9.1 g/dL — ABNORMAL LOW (ref 12.0–15.0)
MCH: 29.1 pg (ref 26.0–34.0)
MCHC: 31.8 g/dL (ref 30.0–36.0)
MCV: 91.4 fL (ref 80.0–100.0)
Platelets: 241 10*3/uL (ref 150–400)
RBC: 3.13 MIL/uL — ABNORMAL LOW (ref 3.87–5.11)
RDW: 15.6 % — ABNORMAL HIGH (ref 11.5–15.5)
WBC: 10.9 10*3/uL — ABNORMAL HIGH (ref 4.0–10.5)
nRBC: 0 % (ref 0.0–0.2)

## 2021-02-25 LAB — BASIC METABOLIC PANEL
Anion gap: 6 (ref 5–15)
BUN: 42 mg/dL — ABNORMAL HIGH (ref 8–23)
CO2: 26 mmol/L (ref 22–32)
Calcium: 8.2 mg/dL — ABNORMAL LOW (ref 8.9–10.3)
Chloride: 105 mmol/L (ref 98–111)
Creatinine, Ser: 1.86 mg/dL — ABNORMAL HIGH (ref 0.44–1.00)
GFR, Estimated: 26 mL/min — ABNORMAL LOW (ref >60)
Glucose, Bld: 315 mg/dL — ABNORMAL HIGH (ref 70–99)
Potassium: 3.8 mmol/L (ref 3.5–5.1)
Sodium: 137 mmol/L (ref 135–145)

## 2021-02-25 LAB — GLUCOSE, CAPILLARY
Glucose-Capillary: 273 mg/dL — ABNORMAL HIGH (ref 70–99)
Glucose-Capillary: 193 mg/dL — ABNORMAL HIGH (ref 70–99)
Glucose-Capillary: 152 mg/dL — ABNORMAL HIGH (ref 70–99)
Glucose-Capillary: 139 mg/dL — ABNORMAL HIGH (ref 70–99)

## 2021-02-25 MED ORDER — INSULIN DETEMIR 100 UNIT/ML ~~LOC~~ SOLN
27.0000 [IU] | Freq: Two times a day (BID) | SUBCUTANEOUS | Status: DC
  Administered 2021-02-25 – 2021-02-28 (×7): 27 [IU] via SUBCUTANEOUS
  Filled 2021-02-25 (×13): qty 0.27

## 2021-02-25 MED ORDER — FUROSEMIDE 10 MG/ML IJ SOLN
80.0000 mg | Freq: Two times a day (BID) | INTRAMUSCULAR | Status: DC
  Administered 2021-02-25 – 2021-02-26 (×2): 80 mg via INTRAVENOUS
  Filled 2021-02-25 (×2): qty 8

## 2021-02-25 MED ORDER — ALBUTEROL SULFATE (2.5 MG/3ML) 0.083% IN NEBU
2.5000 mg | INHALATION_SOLUTION | RESPIRATORY_TRACT | Status: DC | PRN
  Administered 2021-02-25: 2.5 mg via RESPIRATORY_TRACT
  Filled 2021-02-25: qty 3

## 2021-02-25 MED ORDER — FUROSEMIDE 10 MG/ML IJ SOLN
80.0000 mg | Freq: Two times a day (BID) | INTRAMUSCULAR | Status: DC
  Administered 2021-02-25: 80 mg via INTRAVENOUS
  Filled 2021-02-25: qty 8

## 2021-02-25 NOTE — Progress Notes (Signed)
Patient alert and oriented with no complaints of pain, shortness of breath, chest pain or discomfort. No complaints of nausea or vomiting. Nitro drip titrated down throughout afternoon and stopped around 1600. Respirations mainly remaining less than 20 and O2 sat above 95% on 2L O2 via nasal cannula. Patient does appear to using abdomen when breathing and prn breathing treatments ordered. Patient denies feeling short of breath at and during this time. Dr Manuella Ghazi updated and aware. Will continue to monitor.

## 2021-02-25 NOTE — Progress Notes (Signed)
Patient sleeping comfortably at this time.  On 2L [place] with a sat of 95%, no respiratory distress noted at this time.

## 2021-02-25 NOTE — Progress Notes (Addendum)
PROGRESS NOTE    [name]  [id number] DOB: Aug 28, 1934 DOA: 02/24/2021 PCP: [name], MD   Brief Narrative:   [name] is a 86 y.o. female with medical history significant for chronic diastolic CHF, obesity, CKD stage IV, type 2 diabetes, hypertension, and GERD who presented to the ED via EMS with worsening respiratory distress over the last 1-2 days.  She has been admitted with acute hypoxemic respiratory failure secondary to acute on chronic diastolic CHF exacerbation.  She is currently weaning off nitroglycerin drip and IV Lasix dose is being increased for further diuresis.  Cardiology evaluation pending.  Assessment & Plan:   Active Problems:   Acute CHF ([place])   Acute hypoxemic respiratory failure secondary to acute on chronic diastolic CHF exacerbation -Previous echo on 09/2020 with LVEF 55-60% with grade 2 diastolic dysfunction -Limited 2D echocardiogram with no wall motion abnormalities and LVEF 60-65% -Strict I's and O's with almost 1 L negative balance -Fluid restriction -Daily weights, with baseline near 200 pounds-but her current weight is near 223 LBS -Wean off nitroglycerin drip -Adjusted Lasix dosing to 80 mg twice daily 10/11 -Cardiology consultation appreciated to assist with further diuresis   Uncontrolled hypertension-improving -Continue amlodipine, hydralazine, Imdur -Started on nitroglycerin drip as noted above and wean to off -Continue monitor on IV Lasix for diuresis   Type 2 diabetes with hyperglycemia -Hemoglobin A1c 8.8% -Maintain on insulin and SSI -Levemir dose increased to 27 units twice daily   Anemia -No overt bleeding noted -Anemia panel with no significant abnormalities -Monitor CBC   Obesity -Lifestyle changes outpatient   Debility -Wheelchair-bound at home -Does not want to go back to SNF   DVT prophylaxis: Heparin Code Status: DNI Family Communication: Daughter at bedside 10/11 Disposition Plan:  Status is:  Inpatient  Remains inpatient appropriate because:Hemodynamically unstable, IV treatments appropriate due to intensity of illness or inability to take PO, and Inpatient level of care appropriate due to severity of illness  Dispo: The patient is from: Home              Anticipated d/c is to: Home              Patient currently is not medically stable to d/c.   Difficult to place patient No   Consultants:  Cardiology  Procedures:  See below  Antimicrobials:  None   Subjective: Patient seen and evaluated today with improvement in respiratory distress and oxygenation noted.  She is currently on 2 L nasal cannula.  Objective: Vitals:   02/25/21 0900 02/25/21 0909 02/25/21 1000 02/25/21 1200  BP: (!) 158/61 (!) 158/61 (!) 157/69 131/60  Pulse: 66 71 64 71  Resp: '15  15 19  '$ Temp:      TempSrc:      SpO2: 93%  96% 96%  Weight:      Height:        Intake/Output Summary (Last 24 hours) at 02/25/2021 1225 Last data filed at 02/25/2021 0912 Gross per 24 hour  Intake 129 ml  Output 1050 ml  Net -921 ml   Filed Weights   02/24/21 0849 02/24/21 1210 02/25/21 0500  Weight: 90.7 kg 102.1 kg 101.3 kg    Examination:  General exam: Appears calm and comfortable, obese Respiratory system: Clear to auscultation. Respiratory effort normal.  Currently on 2 L nasal cannula. Cardiovascular system: S1 & S2 heard, RRR.  Gastrointestinal system: Abdomen is soft Central nervous system: Alert and awake Extremities: No edema Skin: No significant lesions  noted Psychiatry: Flat affect.    Data Reviewed: I have personally reviewed following labs and imaging studies  CBC: Recent Labs  Lab 02/24/21 0855 02/25/21 0519  WBC 15.8* 10.9*  NEUTROABS 7.1  --   HGB 11.2* 9.1*  HCT 35.7* 28.6*  MCV 91.8 91.4  PLT 321 [id number]   Basic Metabolic Panel: Recent Labs  Lab 02/24/21 0855 02/25/21 0519  NA 140 137  K 3.5 3.8  CL 108 105  CO2 25 26  GLUCOSE 224* 315*  BUN 31* 42*   CREATININE 1.62* 1.86*  CALCIUM 8.7* 8.2*  MG 2.2 2.1   GFR: Estimated Creatinine Clearance: 26.1 mL/min (A) (by C-G formula based on SCr of 1.86 mg/dL (H)). Liver Function Tests: No results for input(s): AST, ALT, ALKPHOS, BILITOT, PROT, ALBUMIN in the last 168 hours. No results for input(s): LIPASE, AMYLASE in the last 168 hours. No results for input(s): AMMONIA in the last 168 hours. Coagulation Profile: No results for input(s): INR, PROTIME in the last 168 hours. Cardiac Enzymes: No results for input(s): CKTOTAL, CKMB, CKMBINDEX, TROPONINI in the last 168 hours. BNP (last 3 results) No results for input(s): PROBNP in the last 8760 hours. HbA1C: Recent Labs    02/24/21 1117  HGBA1C 8.8*   CBG: Recent Labs  Lab 02/24/21 1213 02/24/21 1556 02/24/21 2101 02/25/21 0727 02/25/21 1118  GLUCAP 271* 349* 326* 273* 193*   Lipid Profile: No results for input(s): CHOL, HDL, LDLCALC, TRIG, CHOLHDL, LDLDIRECT in the last 72 hours. Thyroid Function Tests: No results for input(s): TSH, T4TOTAL, FREET4, T3FREE, THYROIDAB in the last 72 hours. Anemia Panel: Recent Labs    02/24/21 1117  VITAMINB12 326  FOLATE 8.0  FERRITIN 67  TIBC 287  IRON 31  RETICCTPCT 2.4   Sepsis Labs: Recent Labs  Lab 02/24/21 1051  PROCALCITON <0.10    Recent Results (from the past 240 hour(s))  Resp Panel by RT-PCR (Flu A&B, Covid) Nasopharyngeal Swab     Status: None   Collection Time: 02/24/21  8:59 AM   Specimen: Nasopharyngeal Swab; Nasopharyngeal(NP) swabs in vial transport medium  Result Value Ref Range Status   SARS Coronavirus 2 by RT PCR NEGATIVE NEGATIVE Final    Comment: (NOTE) SARS-CoV-2 target nucleic acids are NOT DETECTED.  The SARS-CoV-2 RNA is generally detectable in upper respiratory specimens during the acute phase of infection. The lowest concentration of SARS-CoV-2 viral copies this assay can detect is 138 copies/mL. A negative result does not preclude  SARS-Cov-2 infection and should not be used as the sole basis for treatment or other patient management decisions. A negative result may occur with  improper specimen collection/handling, submission of specimen other than nasopharyngeal swab, presence of viral mutation(s) within the areas targeted by this assay, and inadequate number of viral copies(<138 copies/mL). A negative result must be combined with clinical observations, patient history, and epidemiological information. The expected result is Negative.  Fact Sheet for Patients:  EntrepreneurPulse.com.au  Fact Sheet for Healthcare Providers:  IncredibleEmployment.be  This test is no t yet approved or cleared by the [place] FDA and  has been authorized for detection and/or diagnosis of SARS-CoV-2 by FDA under an Emergency Use Authorization (EUA). This EUA will remain  in effect (meaning this test can be used) for the duration of the COVID-19 declaration under Section 564(b)(1) of the Act, 21 U.S.C.section 360bbb-3(b)(1), unless the authorization is terminated  or revoked sooner.       Influenza A by PCR NEGATIVE NEGATIVE Final  Influenza B by PCR NEGATIVE NEGATIVE Final    Comment: (NOTE) The Xpert Xpress SARS-CoV-2/FLU/RSV plus assay is intended as an aid in the diagnosis of influenza from Nasopharyngeal swab specimens and should not be used as a sole basis for treatment. Nasal washings and aspirates are unacceptable for Xpert Xpress SARS-CoV-2/FLU/RSV testing.  Fact Sheet for Patients: EntrepreneurPulse.com.au  Fact Sheet for Healthcare Providers: IncredibleEmployment.be  This test is not yet approved or cleared by the [place] FDA and has been authorized for detection and/or diagnosis of SARS-CoV-2 by FDA under an Emergency Use Authorization (EUA). This EUA will remain in effect (meaning this test can be used) for the duration of  the COVID-19 declaration under Section 564(b)(1) of the Act, 21 U.S.C. section 360bbb-3(b)(1), unless the authorization is terminated or revoked.  Performed at [facility], [address] [place], [place] [address]   MRSA Next Gen by PCR, Nasal     Status: None   Collection Time: 02/24/21  3:17 PM   Specimen: Nasal Mucosa; Nasal Swab  Result Value Ref Range Status   MRSA by PCR Next Gen NOT DETECTED NOT DETECTED Final    Comment: (NOTE) The GeneXpert MRSA Assay (FDA approved for NASAL specimens only), is one component of a comprehensive MRSA colonization surveillance program. It is not intended to diagnose MRSA infection nor to guide or monitor treatment for MRSA infections. Test performance is not FDA approved in patients less than 53 years old. Performed at [facility], [address] [place], [place] [address]          Radiology Studies: [facility] 1 View  Result Date: 02/24/2021 CLINICAL DATA:  Shortness of breath.  Respiratory distress. EXAM: PORTABLE CHEST 1 VIEW COMPARISON:  Radiographs 10/12/2020 and 10/11/2020.  CT 07/23/2019. FINDINGS: 0918 hours. Stable cardiomegaly. There is recurrent pulmonary edema without confluent airspace opacity, significant pleural effusion or pneumothorax. Prominent left lower thoracic paraspinal osteophyte appears unchanged. No acute osseous findings. Telemetry leads overlie the chest. IMPRESSION: Cardiomegaly with recurrent pulmonary edema consistent with congestive heart failure. Electronically Signed   By: Richardean Sale M.D.   On: 02/24/2021 09:49   ECHOCARDIOGRAM LIMITED  Result Date: 02/24/2021    ECHOCARDIOGRAM LIMITED REPORT   Patient Name:   [name] Date of Exam: 02/24/2021 Medical Rec #:  [id number]    Height:       64.0 in Accession #:    [id number]   Weight:       225.1 lb Date of Birth:  March 25, 1935     BSA:          2.057 m Patient Age:    35 years     BP:           161/36 mmHg Patient Gender: F            HR:           80  bpm. Exam Location:  [facility] Procedure: Limited Echo Indications:    CHF-Acute Diastolic [id number]  History:        Patient has prior history of Echocardiogram examinations, most                 recent 09/25/2020. CHF, Stroke, Arrythmias:Atrial Fibrillation;                 Risk Factors:Hypertension and Diabetes. Hx of Pneumonia due to                 COVID-19 virus.  Sonographer:  Alvino Chapel RCS Referring Phys: [id number] Royanne Foots [facility]  Sonographer Comments: Patient on BiPAP throughout echo exam. Could not sniff on BiPAP. IMPRESSIONS  1. Left ventricular ejection fraction, by estimation, is 60 to 65%. The left ventricle has normal function. The left ventricle has no regional wall motion abnormalities. There is moderate left ventricular hypertrophy.  2. Right ventricular systolic function is normal. The right ventricular size is mildly enlarged. FINDINGS  Left Ventricle: Left ventricular ejection fraction, by estimation, is 60 to 65%. The left ventricle has normal function. The left ventricle has no regional wall motion abnormalities. The left ventricular internal cavity size was normal in size. There is  moderate left ventricular hypertrophy. Right Ventricle: The right ventricular size is mildly enlarged. No increase in right ventricular wall thickness. Right ventricular systolic function is normal. Pericardium: Trivial pericardial effusion is present. Aorta: The aortic root is normal in size and structure. LEFT VENTRICLE PLAX 2D LVIDd:         4.60 cm LVIDs:         2.80 cm LV PW:         1.10 cm LV IVS:        1.10 cm LVOT diam:     1.80 cm LVOT Area:     2.54 cm  LEFT ATRIUM         Index LA diam:    3.70 cm 1.80 cm/m   AORTA Ao Root diam: 3.00 cm  SHUNTS Systemic Diam: 1.80 cm Oswaldo Milian MD Electronically signed by Oswaldo Milian MD Signature Date/Time: 02/24/2021/6:47:41 PM    Final         Scheduled Meds:  amLODipine  10 mg Oral Daily   chlorhexidine  15 mL Mouth Rinse BID    Chlorhexidine Gluconate Cloth  6 each Topical Q0600   diclofenac Sodium  2 g Topical QID   DULoxetine  30 mg Oral Daily   furosemide  80 mg Intravenous Q12H   gabapentin  300 mg Oral TID   heparin  5,000 Units Subcutaneous Q8H   hydrALAZINE  100 mg Oral TID   insulin aspart  0-15 Units Subcutaneous TID WC   insulin aspart  0-5 Units Subcutaneous QHS   insulin aspart  5 Units Subcutaneous TID WC   insulin detemir  27 Units Subcutaneous BID   mouth rinse  15 mL Mouth Rinse q12n4p   metoprolol succinate  25 mg Oral Daily   pantoprazole  40 mg Oral Daily   sodium chloride flush  3 mL Intravenous Q12H   Continuous Infusions:  sodium chloride     nitroGLYCERIN 40 mcg/min (02/25/21 0248)     LOS: 1 day    Time spent: 35 minutes    Daniesha Driver Darleen Crocker, DO [facility]  If 7PM-7AM, please contact night-coverage [email or web address] 02/25/2021, 12:25 PM

## 2021-02-25 NOTE — Progress Notes (Signed)
Inpatient Diabetes Program Recommendations  AACE/ADA: New Consensus Statement on Inpatient Glycemic Control   Target Ranges:  Prepandial:   less than 140 mg/dL      Peak postprandial:   less than 180 mg/dL (1-2 hours)      Critically ill patients:  140 - 180 mg/dL   Results for [name], [name] (MRN [id number]) as of 02/25/2021 07:40  Ref. Range 02/24/2021 12:13 02/24/2021 15:56 02/24/2021 21:01 02/25/2021 07:27  Glucose-Capillary Latest Ref Range: 70 - 99 mg/dL 271 (H) 349 (H) 326 (H) 273 (H)  Results for [name], [name] (MRN [id number]) as of 02/25/2021 07:40  Ref. Range 09/26/2020 03:23 02/24/2021 11:17  Hemoglobin A1C Latest Ref Range: 4.8 - 5.6 % 9.9 (H) 8.8 (H)   Review of Glycemic Control  Diabetes history: DM2 Outpatient Diabetes medications: Levemir 30 units QHS, Novolog 0-10 units TID with meals Current orders for Inpatient glycemic control: Levemir 22 units BID, Novolog 0-15 units TID with meals, Novolog 0-5 units QHS, Novolog 5 units TID with meals  Inpatient Diabetes Program Recommendations:    Insulin: Please consider increasing Levemir to 27 units BID.  HbgA1C: A1C 8.8% on 02/24/21 indicating an average glucose of 206 mg/dl over the past 2-3 months. Noted in chart, patient received steroid injection in left knee on 12/17/20 and on 01/14/21 by Dr. Luna Glasgow which is likely contributing to elevated A1C.  Thanks, Barnie Alderman, RN, MSN, CDE Diabetes Coordinator Inpatient Diabetes Program [phone] (Team Pager from 8am to 5pm)

## 2021-02-25 NOTE — Plan of Care (Signed)

## 2021-02-25 NOTE — Consult Note (Addendum)
Cardiology Consultation:   Patient ID: [name] MRN: [id number]; DOB: 28-Nov-1934  Admit date: 02/24/2021 Date of Consult: 02/25/2021  PCP:  [name], MD   [facility] Cardiologist:  Minus Breeding, MD        Patient Profile:   [name] is a 86 y.o. female with a hx of chronic diastolic CHF, CKD IV, DM2, HTN, GERD, obesity, PAF, prior stroke, LBBB, hemorrhoids, intraparenchymal brain hemorrhage 09/2020, anemia, wheelchair bound who is being seen 02/25/2021 for the evaluation of CHF at the request of Dr. Manuella Ghazi.  History of Present Illness:   [name] was previously followed by Dr. Bronson Ing for diastolic CHF. She also had a low risk nuc in 06/2019. When hospitalized in 07/2019 for pancreatitis, she developed new onset atrial fib and converted to NSR. She was placed on rate control and anticoagulation. She followed up with Dr. Percival Spanish 12/2019 who suggested OP event monitor to guide whether chronic anticoagulation would be needed. She also followed up with Katina Dung in 01/2020. I do not see that event monitor was ever completed. She has not been seen by our team since then. She had a prolonged admission 09/2020 with acute onset headache, pulmonary edema, and brain hemorrhage. She was treated with Andexxa to reverse her Eliquis. Eliquis was discontinued. She was treated for aspiration as well. She required discharge to CIR. Echo that admission showed  EF 55-60%, grade 2DD. At baseline she is said to be wheelchair bound. She lives at home.  She presented to the ED with worsening respiratory distress x 1-2 days with acute hypoxemic respiratory failure. She states she thought she may have had a fever at home but wasn't sure. She has been taking all of her medicines per her report. H/P alludes to eaitng more takeout/high sodium foods. She also reports R>L edema ever since her brain hemorrhage earlier this year. She denies any CP. She received steroids and nebs by EMS. She was  hypertensive and tachycardic on arrival. She was placed on IV NTG, BiPAP and received IV Lasix in the ED. Labs show troponin neg x2, BNP 150, leukocytosis of 15.8, Cr 1.62 on admit rising to 1.86 today, Hgb down to 9.1, previously 10-11 range, Covid/flu panel negative. CXR with recurrent pulmonary edema consistent with congestive heart failure. 2D echo yesterday showed EF 60-65%, moderate LVH, mildly enlarged RV. Cardiology asked to consult on CHF. She states her shortness of breath is better today but still has accessory muscle use at times. She has been able to deescalate to [place]. The IM team has increased her Lasix to '80mg'$  BID today.    Past Medical History:  Diagnosis Date   Anemia    Arthritis    Chronic diastolic heart failure (HCC)    CKD (chronic kidney disease) stage 4, GFR 15-29 ml/min (HCC)    Essential hypertension    GERD (gastroesophageal reflux disease)    Hemorrhoids    History of stroke    Intraparenchymal hemorrhage of brain (HCC)    PAF (paroxysmal atrial fibrillation) (HCC)    Type 2 diabetes mellitus ([place])     Past Surgical History:  Procedure Laterality Date   BACK SURGERY     CATARACT EXTRACTION W/PHACO Left 02/28/2013   Procedure: CATARACT EXTRACTION PHACO AND INTRAOCULAR LENS PLACEMENT (IOL) CDE=15.60;  Surgeon: Elta Guadeloupe T. Gershon Crane, MD;  Location: [facility];  Service: Ophthalmology;  Laterality: Left;   CHOLECYSTECTOMY     COLONOSCOPY  12/12/2003   RMR: Normal rectum.  Long capacious  tortuous colon, colonic mucosa appeared normal   COLONOSCOPY N/A 03/21/2014   Dr. [name]: internal and external hemorrhoids, torturous colon, colonic diverticulosis    ESOPHAGOGASTRODUODENOSCOPY  12/28/2001   [id number] sliding hiatal hernia/. Three small bulbar ulcers, two with stigmata of bleeding and these were coagulated using heater probe unit    ESOPHAGOGASTRODUODENOSCOPY N/A 03/21/2014   Dr. [name]: cervical esophageal web s/p dilation, negative H.pylori    EYE SURGERY     HIP FRACTURE  SURGERY  2008   JOINT REPLACEMENT     Rt hip, ????? sounds like just for fracture   MALONEY DILATION N/A 03/21/2014   Procedure: [name] DILATION;  Surgeon: [name], MD;  Location: [facility];  Service: Endoscopy;  Laterality: N/A;   SAVORY DILATION N/A 03/21/2014   Procedure: SAVORY DILATION;  Surgeon: [name], MD;  Location: [facility];  Service: Endoscopy;  Laterality: N/A;     Home Medications:  Prior to Admission medications   Medication Sig Start Date End Date Taking? Authorizing Provider  acetaminophen (TYLENOL) 325 MG tablet Take 2 tablets (650 mg total) by mouth every 6 (six) hours as needed for mild pain, fever or headache (or Fever >/= 101). 07/31/19  Yes Emokpae, Courage, MD  amLODipine (NORVASC) 10 MG tablet Take 1 tablet (10 mg total) by mouth daily. 09/29/20  Yes Patrecia Pour, MD  DULoxetine (CYMBALTA) 30 MG capsule Take 1 capsule (30 mg total) by mouth daily. 10/11/20  Yes Angiulli, Lavon Paganini, PA-C  furosemide (LASIX) 40 MG tablet Take 1 tablet (40 mg total) by mouth daily. 10/17/20  Yes Dessa Phi, DO  gabapentin (NEURONTIN) 300 MG capsule Take 300 mg by mouth 3 (three) times daily.   Yes [provider]  hydrALAZINE (APRESOLINE) 100 MG tablet Take 1 tablet (100 mg total) by mouth 3 (three) times daily. 09/30/20  Yes Patrecia Pour, MD  hydrocortisone (ANUSOL-HC) 2.5 % rectal cream Place 1 application rectally 2 (two) times daily as needed for hemorrhoids or anal itching. 01/03/20  Yes Barton Dubois, MD  insulin aspart (NOVOLOG) 100 UNIT/ML FlexPen Inject 0-10 Units into the skin 3 (three) times daily with meals. insulin aspart (novoLOG) injection 0-10 Units 0-10 Units Subcutaneous, 3 times daily with meals CBG < 70: Implement Hypoglycemia Standing Orders and refer to Hypoglycemia Standing Orders sidebar report  CBG 70 - 120: 0 unit CBG 121 - 150: 0 unit  CBG 151 - 200: 1 unit CBG 201 - 250: 2 units CBG 251 - 300: 4 units CBG 301 - 350: 6 units  CBG 351 -  400: 8 units  CBG > 400: 10 units 07/31/19  Yes Emokpae, Courage, MD  insulin detemir (LEVEMIR) 100 UNIT/ML injection Inject 0.22 mLs (22 Units total) into the skin 2 (two) times daily. Patient taking differently: Inject 30 Units into the skin at bedtime. 10/16/20  Yes Dessa Phi, DO  isosorbide mononitrate (IMDUR) 60 MG 24 hr tablet Take 1 tablet (60 mg total) by mouth daily. 10/11/20 10/11/21 Yes Angiulli, Lavon Paganini, PA-C  metoprolol succinate (TOPROL-XL) 25 MG 24 hr tablet Take 1 tablet (25 mg total) by mouth daily. 10/17/20  Yes Dessa Phi, DO  pantoprazole (PROTONIX) 40 MG tablet Take 1 tablet by mouth daily. 09/06/20  Yes [provider]  simvastatin (ZOCOR) 20 MG tablet Take 20 mg by mouth at bedtime. 01/29/21  Yes [provider]  torsemide (DEMADEX) 20 MG tablet Take 20 mg by mouth daily. 01/21/21  Yes [provider]  diclofenac Sodium (VOLTAREN) 1 % GEL Apply 2 g topically 4 (four) times daily. Patient not taking: Reported on 02/24/2021 10/11/20   Angiulli, Lavon Paganini, PA-C  insulin aspart (NOVOLOG) 100 UNIT/ML injection Inject 5 Units into the skin 3 (three) times daily with meals. Patient not taking: No sig reported 10/11/20   Cathlyn Parsons, PA-C    Inpatient Medications: Scheduled Meds:  amLODipine  10 mg Oral Daily   chlorhexidine  15 mL Mouth Rinse BID   Chlorhexidine Gluconate Cloth  6 each Topical Q0600   diclofenac Sodium  2 g Topical QID   DULoxetine  30 mg Oral Daily   furosemide  80 mg Intravenous Q12H   gabapentin  300 mg Oral TID   heparin  5,000 Units Subcutaneous Q8H   hydrALAZINE  100 mg Oral TID   insulin aspart  0-15 Units Subcutaneous TID WC   insulin aspart  0-5 Units Subcutaneous QHS   insulin aspart  5 Units Subcutaneous TID WC   insulin detemir  27 Units Subcutaneous BID   mouth rinse  15 mL Mouth Rinse q12n4p   metoprolol succinate  25 mg Oral Daily   pantoprazole  40 mg Oral Daily   sodium chloride flush  3 mL Intravenous  Q12H   Continuous Infusions:  sodium chloride     nitroGLYCERIN 40 mcg/min (02/25/21 0248)   PRN Meds: sodium chloride, acetaminophen **OR** acetaminophen, hydrocortisone, ondansetron **OR** ondansetron (ZOFRAN) IV, sodium chloride flush  Allergies:   No Known Allergies  Social History:   Social History   Socioeconomic History   Marital status: Widowed    Spouse name: Not on file   Number of children: 8   Years of education: Not on file   Highest education level: Not on file  Occupational History   Not on file  Tobacco Use   Smoking status: Never   Smokeless tobacco: Never  Vaping Use   Vaping Use: Never used  Substance and Sexual Activity   Alcohol use: No   Drug use: No   Sexual activity: Not on file  Other Topics Concern   Not on file  Social History Narrative   Not on file   Social Determinants of Health   Financial Resource Strain: Not on file  Food Insecurity: Not on file  Transportation Needs: Not on file  Physical Activity: Not on file  Stress: Not on file  Social Connections: Not on file  Intimate Partner Violence: Not on file    Family History:    Family History  Problem Relation Age of Onset   Crohn's disease Daughter    Hypertension Mother    Hypertension Sister    Diabetes Brother    Diabetes Sister    Diabetes Sister    Heart attack Sister    Colon cancer Neg Hx      ROS:  Please see the history of present illness.  All other ROS reviewed and negative.     Physical Exam/Data:   Vitals:   02/25/21 0900 02/25/21 0909 02/25/21 1000 02/25/21 1200  BP: (!) 158/61 (!) 158/61 (!) 157/69 131/60  Pulse: 66 71 64 71  Resp: '15  15 19  '$ Temp:      TempSrc:      SpO2: 93%  96% 96%  Weight:      Height:        Intake/Output Summary (Last 24 hours) at 02/25/2021 1304 Last data filed at 02/25/2021 0912 Gross per 24 hour  Intake 126 ml  Output 650 ml  Net -524 ml   Last 3 Weights 02/25/2021 02/24/2021 02/24/2021  Weight (lbs) 223 lb  5.2 oz 225 lb 1.4 oz 200 lb  Weight (kg) 101.3 kg 102.1 kg 90.719 kg     Body mass index is 38.33 kg/m.  General: Obese AAF in no acute distress. Head: Normocephalic, atraumatic, sclera non-icteric, no xanthomas, nares are without discharge. Neck: Negative for carotid bruits. JVP not elevated. Lungs: Crackles bilaterally. No wheezes or rhonchi. Mild increase WOB with accessory muscle use (hunching upward periodically) Heart: RRR S1 S2 without murmurs, rubs, or gallops.  Abdomen: Soft, non-tender, non-distended with normoactive bowel sounds. No rebound/guarding. Extremities: No clubbing or cyanosis. Mild RLE edema with striations present bilaterally indicative of previous more significant edema. Distal pedal pulses are 2+ and equal bilaterally. Neuro: Alert and oriented X 3. Moves all extremities spontaneously. Psych:  Responds to questions appropriately with a normal affect.   EKG:  The EKG was personally reviewed and demonstrates:  sinus tach 108bpm LBBB Telemetry:  Telemetry was personally reviewed and demonstrates:  NSR  Relevant CV Studies: 2D echo 02/25/21   1. Left ventricular ejection fraction, by estimation, is 60 to 65%. The  left ventricle has normal function. The left ventricle has no regional  wall motion abnormalities. There is moderate left ventricular hypertrophy.   2. Right ventricular systolic function is normal. The right ventricular  size is mildly enlarged.   Otherwise see EMR  Laboratory Data:  High Sensitivity Troponin:   Recent Labs  Lab 02/24/21 0855 02/24/21 1048  TROPONINIHS 13 13     Chemistry Recent Labs  Lab 02/24/21 0855 02/25/21 0519  NA 140 137  K 3.5 3.8  CL 108 105  CO2 25 26  GLUCOSE 224* 315*  BUN 31* 42*  CREATININE 1.62* 1.86*  CALCIUM 8.7* 8.2*  MG 2.2 2.1  GFRNONAA 31* 26*  ANIONGAP 7 6    No results for input(s): PROT, ALBUMIN, AST, ALT, ALKPHOS, BILITOT in the last 168 hours. Lipids No results for input(s): CHOL, TRIG,  HDL, LABVLDL, LDLCALC, CHOLHDL in the last 168 hours.  Hematology Recent Labs  Lab 02/24/21 0855 02/24/21 1117 02/25/21 0519  WBC 15.8*  --  10.9*  RBC 3.89 3.38* 3.13*  HGB 11.2*  --  9.1*  HCT 35.7*  --  28.6*  MCV 91.8  --  91.4  MCH 28.8  --  29.1  MCHC 31.4  --  31.8  RDW 15.9*  --  15.6*  PLT 321  --  241   Thyroid No results for input(s): TSH, FREET4 in the last 168 hours.  BNP Recent Labs  Lab 02/24/21 0855  BNP 150.0*    DDimer No results for input(s): DDIMER in the last 168 hours.   Radiology/Studies:  DG Chest Port 1 View  Result Date: 02/24/2021 CLINICAL DATA:  Shortness of breath.  Respiratory distress. EXAM: PORTABLE CHEST 1 VIEW COMPARISON:  Radiographs 10/12/2020 and 10/11/2020.  CT 07/23/2019. FINDINGS: 0918 hours. Stable cardiomegaly. There is recurrent pulmonary edema without confluent airspace opacity, significant pleural effusion or pneumothorax. Prominent left lower thoracic paraspinal osteophyte appears unchanged. No acute osseous findings. Telemetry leads overlie the chest. IMPRESSION: Cardiomegaly with recurrent pulmonary edema consistent with congestive heart failure. Electronically Signed   By: Richardean Sale M.D.   On: 02/24/2021 09:49   ECHOCARDIOGRAM LIMITED  Result Date: 02/24/2021    ECHOCARDIOGRAM LIMITED REPORT   Patient Name:   [name] Date of Exam: 02/24/2021 Medical Rec #:  [id number]    Height:       64.0 in Accession #:    [id number]   Weight:       225.1 lb Date of Birth:  1935-02-26     BSA:          2.057 m Patient Age:    35 years     BP:           161/36 mmHg Patient Gender: F            HR:           80 bpm. Exam Location:  [facility] Procedure: Limited Echo Indications:    CHF-Acute Diastolic [id number]  History:        Patient has prior history of Echocardiogram examinations, most                 recent 09/25/2020. CHF, Stroke, Arrythmias:Atrial Fibrillation;                 Risk Factors:Hypertension and Diabetes. Hx of Pneumonia due  to                 COVID-19 virus.  Sonographer:    Alvino Chapel RCS Referring Phys: [id number] Royanne Foots [facility]  Sonographer Comments: Patient on BiPAP throughout echo exam. Could not sniff on BiPAP. IMPRESSIONS  1. Left ventricular ejection fraction, by estimation, is 60 to 65%. The left ventricle has normal function. The left ventricle has no regional wall motion abnormalities. There is moderate left ventricular hypertrophy.  2. Right ventricular systolic function is normal. The right ventricular size is mildly enlarged. FINDINGS  Left Ventricle: Left ventricular ejection fraction, by estimation, is 60 to 65%. The left ventricle has normal function. The left ventricle has no regional wall motion abnormalities. The left ventricular internal cavity size was normal in size. There is  moderate left ventricular hypertrophy. Right Ventricle: The right ventricular size is mildly enlarged. No increase in right ventricular wall thickness. Right ventricular systolic function is normal. Pericardium: Trivial pericardial effusion is present. Aorta: The aortic root is normal in size and structure. LEFT VENTRICLE PLAX 2D LVIDd:         4.60 cm LVIDs:         2.80 cm LV PW:         1.10 cm LV IVS:        1.10 cm LVOT diam:     1.80 cm LVOT Area:     2.54 cm  LEFT ATRIUM         Index LA diam:    3.70 cm 1.80 cm/m   AORTA Ao Root diam: 3.00 cm  SHUNTS Systemic Diam: 1.80 cm Oswaldo Milian MD Electronically signed by Oswaldo Milian MD Signature Date/Time: 02/24/2021/6:47:41 PM    Final      Assessment and Plan:   1. Acute hypoxic respiratory failure - suspect primarily driven by CHF but also note prior history of aspiration as well as asymmetric edema - consider PE r/o if hypoxia does not continue to improve despite CHF treatment, will defer to primary team  2. Acute on chronic diastolic CHF - echo findings as above - weights variable in Epic - her reported baseline weight is around 200 but most recent measured  weights in 10/2020 were 226-228 (first measured weight here was 225, down to 223 today) - agree with increase in IV Lasix as planned - follow output and weights - consider renal artery duplex if available here to exclude RAS  contributing to recurrent pulmonary edema - this occurred in 09/2020 as well - check TSH as well  3. CKD stage IV - prior baseline 1.8-2, admitted value may be lower due to volume overload - follow-up today is 1.86 - continue to follow  4. Paroxysmal atrial fibrillation - maintaining NSR - not on Eliquis due to h/o ICH  5. LBBB  - no bradycardia seen  6. Chronic anemia - per primary team  7. Accelerated HTN - managed in context above - continue amlodipine, higher dose Lasix, hydralazine, Toprol, IV NTG for now - will review med plans with MD once seen (consideration could be given to changing Toprol to carvedilol if needed)  Risk Assessment/Risk Scores:        New York Heart Association (NYHA) Functional Class Stage 4 on arrival   CHA2DS2-VASc Score = 8   This indicates a 10.8% annual risk of stroke. The patient's score is based upon: CHF History: 1 HTN History: 1 Diabetes History: 1 Stroke History: 2 Vascular Disease History: 0 Age Score: 2 Gender Score: 1     For questions or updates, please contact [place] Please consult [email or web address] for contact info under    Signed, Charlie Pitter, PA-C  02/25/2021 1:04 PM  Personally seen and examined. Agree with above.  86 year old with acute hypoxic respiratory failure with acute on chronic diastolic heart failure stage IV chronic kidney disease with paroxysmal atrial fibrillation left bundle branch block chronic anemia and accelerated hypertension.  Echo with normal EF 65% and moderate LVH.  Hemoglobin 9.1 BNP 150 creatinine 1.86 mildly increased from 1.62.  Glucose 315  Obese, mild crackles with mild increased work of breathing minimal right lower extremity edema.  Tele: artifact noted in  one lead, SR  -Agree with continued use of IV Lasix -Would not be a bad idea to exclude renal artery stenosis as a possible culprit for recurrent pulmonary flash edema. -Continue to monitor her creatinine.  Today 1.86.  Prior baseline has been between 1.8 and 2.  Would expect a gentle increase following diuresis with her chronic kidney disease stage IV. -Continuing to maintain sinus rhythm.  Does not appear to be an anticoagulation candidate secondary to intracranial hemorrhage. -Blood pressure has been challenging, currently on amlodipine, IV Lasix hydralazine Toprol and IV nitroglycerin.  OK to stop IV NTG. Agree that changing from Toprol to carvedilol may be helpful given the alpha blockade with carvedilol and potentially improved blood pressure control.  However, lets address this tomorrow as I would expect further diuresis will be the major input on her decrease in blood pressure.  Candee Furbish, MD

## 2021-02-25 NOTE — Progress Notes (Signed)
Checked on patient during earlier rounds in ICU.  No wheezing heard, BS clear and diminished.  O2 sat was around 95% and rate was not elevated.  Related to patient that if at any time that she became SOB and breathing became labored to have the RN call me.  Bipap is still at bedside if needed, but patient seems to be tolerating [place] well for now; will continue to monitor.

## 2021-02-26 ENCOUNTER — Inpatient Hospital Stay (HOSPITAL_COMMUNITY): Payer: Medicare HMO

## 2021-02-26 DIAGNOSIS — I5031 Acute diastolic (congestive) heart failure: Secondary | ICD-10-CM | POA: Diagnosis not present

## 2021-02-26 DIAGNOSIS — I169 Hypertensive crisis, unspecified: Secondary | ICD-10-CM

## 2021-02-26 LAB — CBC
HCT: 29.5 % — ABNORMAL LOW (ref 36.0–46.0)
Hemoglobin: 9 g/dL — ABNORMAL LOW (ref 12.0–15.0)
MCH: 28.1 pg (ref 26.0–34.0)
MCHC: 30.5 g/dL (ref 30.0–36.0)
MCV: 92.2 fL (ref 80.0–100.0)
Platelets: 221 10*3/uL (ref 150–400)
RBC: 3.2 MIL/uL — ABNORMAL LOW (ref 3.87–5.11)
RDW: 15.7 % — ABNORMAL HIGH (ref 11.5–15.5)
WBC: 9.4 10*3/uL (ref 4.0–10.5)
nRBC: 0 % (ref 0.0–0.2)

## 2021-02-26 LAB — BASIC METABOLIC PANEL
Anion gap: 6 (ref 5–15)
BUN: 46 mg/dL — ABNORMAL HIGH (ref 8–23)
CO2: 29 mmol/L (ref 22–32)
Calcium: 8.4 mg/dL — ABNORMAL LOW (ref 8.9–10.3)
Chloride: 105 mmol/L (ref 98–111)
Creatinine, Ser: 2.01 mg/dL — ABNORMAL HIGH (ref 0.44–1.00)
GFR, Estimated: 24 mL/min — ABNORMAL LOW (ref >60)
Glucose, Bld: 162 mg/dL — ABNORMAL HIGH (ref 70–99)
Potassium: 3.6 mmol/L (ref 3.5–5.1)
Sodium: 140 mmol/L (ref 135–145)

## 2021-02-26 LAB — GLUCOSE, CAPILLARY
Glucose-Capillary: 142 mg/dL — ABNORMAL HIGH (ref 70–99)
Glucose-Capillary: 152 mg/dL — ABNORMAL HIGH (ref 70–99)
Glucose-Capillary: 144 mg/dL — ABNORMAL HIGH (ref 70–99)
Glucose-Capillary: 112 mg/dL — ABNORMAL HIGH (ref 70–99)

## 2021-02-26 LAB — MAGNESIUM: Magnesium: 2 mg/dL (ref 1.7–2.4)

## 2021-02-26 MED ORDER — CARVEDILOL 12.5 MG PO TABS
12.5000 mg | ORAL_TABLET | Freq: Two times a day (BID) | ORAL | Status: DC
  Administered 2021-02-26 – 2021-02-28 (×4): 12.5 mg via ORAL
  Filled 2021-02-26 (×4): qty 1

## 2021-02-26 MED ORDER — TORSEMIDE 20 MG PO TABS: 40.0000 mg | ORAL_TABLET | Freq: Every day | ORAL | Status: DC

## 2021-02-26 NOTE — Progress Notes (Signed)
Progress Note  Patient Name: [name] Date of Encounter: 02/26/2021  Primary Cardiologist: Minus Breeding, MD  Subjective   SOB improving.  Net -4.7 L since admission.  Inpatient Medications    Scheduled Meds:  amLODipine  10 mg Oral Daily   chlorhexidine  15 mL Mouth Rinse BID   Chlorhexidine Gluconate Cloth  6 each Topical Q0600   diclofenac Sodium  2 g Topical QID   DULoxetine  30 mg Oral Daily   furosemide  80 mg Intravenous Q12H   gabapentin  300 mg Oral TID   heparin  5,000 Units Subcutaneous Q8H   hydrALAZINE  100 mg Oral TID   insulin aspart  0-15 Units Subcutaneous TID WC   insulin aspart  0-5 Units Subcutaneous QHS   insulin aspart  5 Units Subcutaneous TID WC   insulin detemir  27 Units Subcutaneous BID   mouth rinse  15 mL Mouth Rinse q12n4p   metoprolol succinate  25 mg Oral Daily   pantoprazole  40 mg Oral Daily   sodium chloride flush  3 mL Intravenous Q12H   Continuous Infusions:  sodium chloride     nitroGLYCERIN Stopped (02/25/21 1615)   PRN Meds: sodium chloride, acetaminophen **OR** acetaminophen, albuterol, hydrocortisone, ondansetron **OR** ondansetron (ZOFRAN) IV, sodium chloride flush   Vital Signs    Vitals:   02/25/21 2327 02/26/21 0432 02/26/21 0700 02/26/21 0721  BP:   (!) 179/144   Pulse:   65 64  Resp:   18 17  Temp: 97.9 F (36.6 C) 97.7 F (36.5 C)  97.7 F (36.5 C)  TempSrc: Oral Oral  Oral  SpO2:   96% 100%  Weight:  100 kg    Height:        Intake/Output Summary (Last 24 hours) at 02/26/2021 0739 Last data filed at 02/26/2021 0721 Gross per 24 hour  Intake 109 ml  Output 3250 ml  Net -3141 ml   Filed Weights   02/24/21 1210 02/25/21 0500 02/26/21 0432  Weight: 102.1 kg 101.3 kg 100 kg    Physical Exam   Vitals:   02/26/21 0432 02/26/21 0700 02/26/21 0721 02/26/21 0800  BP:  (!) 179/144  (!) 180/57  Pulse:  65 64 63  Resp:  18 17 18   Temp: 97.7 F (36.5 C)  97.7 F (36.5 C)   TempSrc: Oral  Oral    SpO2:  96% 100% 97%  Weight: 100 kg     Height:        Intake/Output Summary (Last 24 hours) at 02/26/2021 1009 Last data filed at 02/26/2021 0800 Gross per 24 hour  Intake 103 ml  Output 3950 ml  Net -3847 ml    Last 3 Weights 02/26/2021 02/25/2021 02/24/2021  Weight (lbs) 220 lb 7.4 oz 223 lb 5.2 oz 225 lb 1.4 oz  Weight (kg) 100 kg 101.3 kg 102.1 kg    Body mass index is 37.84 kg/m.  General: Well nourished, well developed, in no acute distress Head: Atraumatic, normal size  Eyes: PEERLA, EOMI  Neck: Supple, no JVD Endocrine: No thryomegaly Cardiac: Normal S1, S2; RRR; no murmurs, rubs, or gallops Lungs: Clear to auscultation bilaterally, no wheezing, rhonchi or rales  Abd: Soft, nontender, no hepatomegaly  Ext: No edema, pulses 2+ Musculoskeletal: No deformities, BUE and BLE strength normal and equal Skin: Warm and dry, no rashes   Neuro: Alert and oriented to person, place, time, and situation, CNII-XII grossly intact, no focal deficits  Psych: Normal mood  and affect    Labs    Chemistry Recent Labs  Lab 02/24/21 0855 02/25/21 0519 02/26/21 0424  NA 140 137 140  K 3.5 3.8 3.6  CL 108 105 105  CO2 25 26 29   GLUCOSE 224* 315* 162*  BUN 31* 42* 46*  CREATININE 1.62* 1.86* 2.01*  CALCIUM 8.7* 8.2* 8.4*  GFRNONAA 31* 26* 24*  ANIONGAP 7 6 6      Hematology Recent Labs  Lab 02/24/21 0855 02/24/21 1117 02/25/21 0519 02/26/21 0424  WBC 15.8*  --  10.9* 9.4  RBC 3.89 3.38* 3.13* 3.20*  HGB 11.2*  --  9.1* 9.0*  HCT 35.7*  --  28.6* 29.5*  MCV 91.8  --  91.4 92.2  MCH 28.8  --  29.1 28.1  MCHC 31.4  --  31.8 30.5  RDW 15.9*  --  15.6* 15.7*  PLT 321  --  241 221    Cardiac Enzymes  Recent Labs  Lab 02/24/21 0855 02/24/21 1048  TROPONINIHS 13 13      BNP Recent Labs  Lab 02/24/21 0855  BNP 150.0*     DDimer No results for input(s): DDIMER in the last 168 hours.   Lipids  Lab Results  Component Value Date   CHOL 129 09/26/2020   HDL  29 (L) 09/26/2020   LDLCALC 73 09/26/2020   TRIG 133 09/26/2020   CHOLHDL 4.4 09/26/2020    HbA1c  Lab Results  Component Value Date   HGBA1C 8.8 (H) 02/24/2021    Radiology    DG Chest Port 1 View  Result Date: 02/24/2021 CLINICAL DATA:  Shortness of breath.  Respiratory distress. EXAM: PORTABLE CHEST 1 VIEW COMPARISON:  Radiographs 10/12/2020 and 10/11/2020.  CT 07/23/2019. FINDINGS: 0918 hours. Stable cardiomegaly. There is recurrent pulmonary edema without confluent airspace opacity, significant pleural effusion or pneumothorax. Prominent left lower thoracic paraspinal osteophyte appears unchanged. No acute osseous findings. Telemetry leads overlie the chest. IMPRESSION: Cardiomegaly with recurrent pulmonary edema consistent with congestive heart failure. Electronically Signed   By: Richardean Sale M.D.   On: 02/24/2021 09:49   ECHOCARDIOGRAM LIMITED  Result Date: 02/24/2021    ECHOCARDIOGRAM LIMITED REPORT   Patient Name:   [name] Date of Exam: 02/24/2021 Medical Rec #:  [id number]    Height:       64.0 in Accession #:    [id number]   Weight:       225.1 lb Date of Birth:  [birth year]     BSA:          2.057 m Patient Age:    28 years     BP:           161/36 mmHg Patient Gender: F            HR:           80 bpm. Exam Location:  [facility] Procedure: Limited Echo Indications:    CHF-Acute Diastolic Q22.29  History:        Patient has prior history of Echocardiogram examinations, most                 recent 09/25/2020. CHF, Stroke, Arrythmias:Atrial Fibrillation;                 Risk Factors:Hypertension and Diabetes. Hx of Pneumonia due to                 COVID-19 virus.  Sonographer:    Alvino Chapel RCS Referring Phys: [id number]  Royanne Foots [facility]  Sonographer Comments: Patient on BiPAP throughout echo exam. Could not sniff on BiPAP. IMPRESSIONS  1. Left ventricular ejection fraction, by estimation, is 60 to 65%. The left ventricle has normal function. The left ventricle has no regional wall  motion abnormalities. There is moderate left ventricular hypertrophy.  2. Right ventricular systolic function is normal. The right ventricular size is mildly enlarged. FINDINGS  Left Ventricle: Left ventricular ejection fraction, by estimation, is 60 to 65%. The left ventricle has normal function. The left ventricle has no regional wall motion abnormalities. The left ventricular internal cavity size was normal in size. There is  moderate left ventricular hypertrophy. Right Ventricle: The right ventricular size is mildly enlarged. No increase in right ventricular wall thickness. Right ventricular systolic function is normal. Pericardium: Trivial pericardial effusion is present. Aorta: The aortic root is normal in size and structure. LEFT VENTRICLE PLAX 2D LVIDd:         4.60 cm LVIDs:         2.80 cm LV PW:         1.10 cm LV IVS:        1.10 cm LVOT diam:     1.80 cm LVOT Area:     2.54 cm  LEFT ATRIUM         Index LA diam:    3.70 cm 1.80 cm/m   AORTA Ao Root diam: 3.00 cm  SHUNTS Systemic Diam: 1.80 cm Oswaldo Milian MD Electronically signed by Oswaldo Milian MD Signature Date/Time: 02/24/2021/6:47:41 PM    Final     Telemetry    Sinus rhythm- Personally Reviewed  ECG    Sinus tachycardia, left bundle branch block- Personally Reviewed  Cardiac Studies   2D Echocardiogram 10.11.2022  IMPRESSIONS 1. Left ventricular ejection fraction, by estimation, is 60 to 65%. The left ventricle has normal function. The left ventricle has no regional wall motion abnormalities. There is moderate left ventricular hypertrophy. 2. Right ventricular systolic function is normal. The right ventricular size is mildly enlarged. FINDINGS Left Ventricle: Left ventricular ejection fraction, by estimation, is 60 to 65%. The left ventricle has normal function. The left ventricle has no regional wall motion abnormalities. The left ventricular internal cavity size was normal in size. There is moderate left  ventricular hypertrophy. Right Ventricle: The right ventricular size is mildly enlarged. No increase in right ventricular wall thickness. Right ventricular systolic function is normal. Pericardium: Trivial pericardial effusion is present. Aorta: The aortic root is normal in size and structure.   Patient Profile     86 y.o. female with a hx of chronic diastolic CHF, CKD IV, DM2, HTN, GERD, obesity, PAF, prior stroke, LBBB, hemorrhoids, intraparenchymal brain hemorrhage 09/2020, anemia, wheelchair bound who is being seen 02/25/2021 for the evaluation of CHF at the request of Dr. Manuella Ghazi.   Assessment & Plan    #Acute on chronic diastolic heart failure #Hypertensive crisis #CKD IV -Net -4.7 L since admission.  She is euvolemic on my exam.  She received 1 more dose of IV diuretic today.  Transition to torsemide 40 mg tomorrow. -She had rather rapid progression of pulmonary edema.  She also had accelerated hypertension.  We will set her up for renal artery duplex tomorrow.  She will need to be n.p.o. for this.  I have placed the orders. -We will get her on aggressive oral antihypertensive regimen. -Transition to Coreg 12.5 mg twice daily.  Continue hydralazine 100 mg 3 times daily.  Continue amlodipine 10 mg  daily.  Next agents to add would be Imdur and/or clonidine.  We will see how she does.  #PAF -no AC due to history of intraparenchymal hemorrhage -Maintaining sinus rhythm.  #CKD stage IV -Creatinine close to baseline.  eGFR is stable.  [place] T. Audie Box, MD, [place]  [address] [place],  [address] [phone]  10:13 AM For questions or updates, please contact   Please consult [email or web address] for contact info under Cardiology/STEMI.

## 2021-02-26 NOTE — Plan of Care (Signed)

## 2021-02-26 NOTE — Progress Notes (Signed)
PROGRESS NOTE    [name]  [id number] DOB: [birth year] DOA: 02/24/2021 PCP: [name], MD  Subjective: The patient was seen and examined this morning, remained stable off BiPAP, satting 100% on 1 L of oxygen,... Still having shortness of breath,    Brief Narrative:   [name] is a 86 y.o. female with medical history significant for chronic diastolic CHF, obesity, CKD stage IV, type 2 diabetes, hypertension, and GERD who presented to the ED via EMS with worsening respiratory distress over the last 1-2 days.  She has been admitted with acute hypoxemic respiratory failure secondary to acute on chronic diastolic CHF exacerbation.  She is currently weaning off nitroglycerin drip and IV Lasix dose is being increased for further diuresis.  Cardiology evaluation pending.  Assessment & Plan:   Active Problems:   Acute CHF ([place])   Acute hypoxemic respiratory failure secondary to acute on chronic diastolic CHF exacerbation -Previous echo on 09/2020 with LVEF 55-60% with grade 2 diastolic dysfunction -Limited 2D echocardiogram with no wall motion abnormalities and LVEF 60-65% -Strict I's and O's -continue diuresing -4.7 L since admission -Fluid restriction -Daily weights, with baseline near 200 lbs -but her current weight is near 223 LBS -Weaned off nitroglycerin drip -Following  On Lasix dosing to 80 mg twice daily 10/11 >> will be transition to p.o. torsemide 40 mg 02/27/2021 -Coreg 12.5 mg p.o. twice daily, hydralazine 1 mg p.o. 3 times daily, amlodipine 10 mg daily, -Following cardiology recommendation   Uncontrolled hypertension-improving -Continue amlodipine, hydralazine, Imdur -Started on nitroglycerin drip as noted above and wean to off -Continue monitor on IV Lasix for diuresis   Type 2 diabetes with hyperglycemia -Hemoglobin A1c 8.8% -Maintain on insulin and SSI -Levemir dose increased to 27 units twice daily   Anemia -No overt bleeding noted -Anemia panel  with no significant abnormalities -Monitor CBC   Obesity -Lifestyle changes outpatient   Debility -Wheelchair-bound at home -Does not want to go back to SNF   DVT prophylaxis: Heparin Code Status: DNI Family Communication: Daughter at bedside 10/11 Disposition Plan:  Status is: Inpatient  Remains inpatient appropriate because:Hemodynamically unstable, IV treatments appropriate due to intensity of illness or inability to take PO, and Inpatient level of care appropriate due to severity of illness  Dispo: The patient is from: Home              Anticipated d/c is to: Home              Patient currently is not medically stable to d/c.   Difficult to place patient No   Consultants:  Cardiology  Procedures:  See below  Antimicrobials:  None  Objective: Vitals:   02/26/21 1000 02/26/21 1100 02/26/21 1133 02/26/21 1200  BP: (!) 166/58 (!) 185/57  (!) 171/61  Pulse: 62 63 66 65  Resp: '15 15 17 15  '$ Temp:   97.7 F (36.5 C)   TempSrc:   Oral   SpO2: 96% 90% 92% 92%  Weight:      Height:        Intake/Output Summary (Last 24 hours) at 02/26/2021 1237 Last data filed at 02/26/2021 1100 Gross per 24 hour  Intake 103 ml  Output 4300 ml  Net -4197 ml   Filed Weights   02/24/21 1210 02/25/21 0500 02/26/21 0432  Weight: 102.1 kg 101.3 kg 100 kg     Physical Exam:   General:  Alert, oriented, cooperative, no distress;   HEENT:  Normocephalic, PERRL, otherwise with  in Normal limits   Neuro:  CNII-XII intact. , normal motor and sensation, reflexes intact   Lungs:   Clear to auscultation BL, Respirations unlabored, no wheezes / crackles  Cardio:    S1/S2, RRR, No murmure, No Rubs or Gallops   Abdomen:   Soft, non-tender, bowel sounds active all four quadrants,  no guarding or peritoneal signs.  Muscular skeletal:  Limited exam - in bed, able to move all 4 extremities, Normal strength,  2+ pulses,  symmetric, No pitting edema  Skin:  Dry, warm to touch, negative for any  Rashes,  Wounds: Please see nursing documentation       Data Reviewed: I have personally reviewed following labs and imaging studies  CBC: Recent Labs  Lab 02/24/21 0855 02/25/21 0519 02/26/21 0424  WBC 15.8* 10.9* 9.4  NEUTROABS 7.1  --   --   HGB 11.2* 9.1* 9.0*  HCT 35.7* 28.6* 29.5*  MCV 91.8 91.4 92.2  PLT 321 241 [id number]   Basic Metabolic Panel: Recent Labs  Lab 02/24/21 0855 02/25/21 0519 02/26/21 0424  NA 140 137 140  K 3.5 3.8 3.6  CL 108 105 105  CO2 '25 26 29  '$ GLUCOSE 224* 315* 162*  BUN 31* 42* 46*  CREATININE 1.62* 1.86* 2.01*  CALCIUM 8.7* 8.2* 8.4*  MG 2.2 2.1 2.0    HbA1C: Recent Labs    02/24/21 1117  HGBA1C 8.8*   CBG: Recent Labs  Lab 02/25/21 1118 02/25/21 1643 02/25/21 2103 02/26/21 0722 02/26/21 1135  GLUCAP 193* 152* 139* 142* 152*   Lipid Profile: No results for input(s): CHOL, HDL, LDLCALC, TRIG, CHOLHDL, LDLDIRECT in the last 72 hours. Thyroid Function Tests: Recent Labs    02/24/21 0855  TSH 3.675   Anemia Panel: Recent Labs    02/24/21 1117  VITAMINB12 326  FOLATE 8.0  FERRITIN 67  TIBC 287  IRON 31  RETICCTPCT 2.4   Sepsis Labs: Recent Labs  Lab 02/24/21 1051  PROCALCITON <0.10    Recent Results (from the past 240 hour(s))  Resp Panel by RT-PCR (Flu A&B, Covid) Nasopharyngeal Swab     Status: None   Collection Time: 02/24/21  8:59 AM   Specimen: Nasopharyngeal Swab; Nasopharyngeal(NP) swabs in vial transport medium  Result Value Ref Range Status   SARS Coronavirus 2 by RT PCR NEGATIVE NEGATIVE Final    Comment: (NOTE) SARS-CoV-2 target nucleic acids are NOT DETECTED.  The SARS-CoV-2 RNA is generally detectable in upper respiratory specimens during the acute phase of infection. The lowest concentration of SARS-CoV-2 viral copies this assay can detect is 138 copies/mL. A negative result does not preclude SARS-Cov-2 infection and should not be used as the sole basis for treatment or other patient  management decisions. A negative result may occur with  improper specimen collection/handling, submission of specimen other than nasopharyngeal swab, presence of viral mutation(s) within the areas targeted by this assay, and inadequate number of viral copies(<138 copies/mL). A negative result must be combined with clinical observations, patient history, and epidemiological information. The expected result is Negative.  Fact Sheet for Patients:  EntrepreneurPulse.com.au  Fact Sheet for Healthcare Providers:  IncredibleEmployment.be  This test is no t yet approved or cleared by the [place] FDA and  has been authorized for detection and/or diagnosis of SARS-CoV-2 by FDA under an Emergency Use Authorization (EUA). This EUA will remain  in effect (meaning this test can be used) for the duration of the COVID-19 declaration under Section 564(b)(1) of the Act,  21 U.S.C.section 360bbb-3(b)(1), unless the authorization is terminated  or revoked sooner.       Influenza A by PCR NEGATIVE NEGATIVE Final   Influenza B by PCR NEGATIVE NEGATIVE Final    Comment: (NOTE) The Xpert Xpress SARS-CoV-2/FLU/RSV plus assay is intended as an aid in the diagnosis of influenza from Nasopharyngeal swab specimens and should not be used as a sole basis for treatment. Nasal washings and aspirates are unacceptable for Xpert Xpress SARS-CoV-2/FLU/RSV testing.  Fact Sheet for Patients: EntrepreneurPulse.com.au  Fact Sheet for Healthcare Providers: IncredibleEmployment.be  This test is not yet approved or cleared by the [place] FDA and has been authorized for detection and/or diagnosis of SARS-CoV-2 by FDA under an Emergency Use Authorization (EUA). This EUA will remain in effect (meaning this test can be used) for the duration of the COVID-19 declaration under Section 564(b)(1) of the Act, 21 U.S.C. section 360bbb-3(b)(1),  unless the authorization is terminated or revoked.  Performed at [facility], [address] [place], [place] [address]   MRSA Next Gen by PCR, Nasal     Status: None   Collection Time: 02/24/21  3:17 PM   Specimen: Nasal Mucosa; Nasal Swab  Result Value Ref Range Status   MRSA by PCR Next Gen NOT DETECTED NOT DETECTED Final    Comment: (NOTE) The GeneXpert MRSA Assay (FDA approved for NASAL specimens only), is one component of a comprehensive MRSA colonization surveillance program. It is not intended to diagnose MRSA infection nor to guide or monitor treatment for MRSA infections. Test performance is not FDA approved in patients less than 27 years old. Performed at [facility], [address] [place], [place] [address]          Radiology Studies: ECHOCARDIOGRAM LIMITED  Result Date: 02/24/2021    ECHOCARDIOGRAM LIMITED REPORT   Patient Name:   [name] Date of Exam: 02/24/2021 Medical Rec #:  [id number]    Height:       64.0 in Accession #:    [id number]   Weight:       225.1 lb Date of Birth:  [birth year]     BSA:          2.057 m Patient Age:    68 years     BP:           161/36 mmHg Patient Gender: F            HR:           80 bpm. Exam Location:  [facility] Procedure: Limited Echo Indications:    CHF-Acute Diastolic [id number]  History:        Patient has prior history of Echocardiogram examinations, most                 recent 09/25/2020. CHF, Stroke, Arrythmias:Atrial Fibrillation;                 Risk Factors:Hypertension and Diabetes. Hx of Pneumonia due to                 COVID-19 virus.  Sonographer:    Alvino Chapel RCS Referring Phys: [id number] Royanne Foots [facility]  Sonographer Comments: Patient on BiPAP throughout echo exam. Could not sniff on BiPAP. IMPRESSIONS  1. Left ventricular ejection fraction, by estimation, is 60 to 65%. The left ventricle has normal function. The left ventricle has no regional wall motion abnormalities. There is moderate left ventricular hypertrophy.  2.  Right ventricular systolic function is normal. The right  ventricular size is mildly enlarged. FINDINGS  Left Ventricle: Left ventricular ejection fraction, by estimation, is 60 to 65%. The left ventricle has normal function. The left ventricle has no regional wall motion abnormalities. The left ventricular internal cavity size was normal in size. There is  moderate left ventricular hypertrophy. Right Ventricle: The right ventricular size is mildly enlarged. No increase in right ventricular wall thickness. Right ventricular systolic function is normal. Pericardium: Trivial pericardial effusion is present. Aorta: The aortic root is normal in size and structure. LEFT VENTRICLE PLAX 2D LVIDd:         4.60 cm LVIDs:         2.80 cm LV PW:         1.10 cm LV IVS:        1.10 cm LVOT diam:     1.80 cm LVOT Area:     2.54 cm  LEFT ATRIUM         Index LA diam:    3.70 cm 1.80 cm/m   AORTA Ao Root diam: 3.00 cm  SHUNTS Systemic Diam: 1.80 cm Oswaldo Milian MD Electronically signed by Oswaldo Milian MD Signature Date/Time: 02/24/2021/6:47:41 PM    Final         Scheduled Meds:  amLODipine  10 mg Oral Daily   carvedilol  12.5 mg Oral BID WC   chlorhexidine  15 mL Mouth Rinse BID   Chlorhexidine Gluconate Cloth  6 each Topical Q0600   diclofenac Sodium  2 g Topical QID   DULoxetine  30 mg Oral Daily   gabapentin  300 mg Oral TID   heparin  5,000 Units Subcutaneous Q8H   hydrALAZINE  100 mg Oral TID   insulin aspart  0-15 Units Subcutaneous TID WC   insulin aspart  0-5 Units Subcutaneous QHS   insulin aspart  5 Units Subcutaneous TID WC   insulin detemir  27 Units Subcutaneous BID   mouth rinse  15 mL Mouth Rinse q12n4p   pantoprazole  40 mg Oral Daily   sodium chloride flush  3 mL Intravenous Q12H   [START ON 02/27/2021] torsemide  40 mg Oral Daily   Continuous Infusions:  sodium chloride       LOS: 2 days    Time spent: 35 minutes     SIGNED: Deatra James, MD, FHM. [facility],  Pager (please use [email or web address] to page/text)  Please use Epic Secure Chat for non-urgent communication (7AM-7PM) If 7PM-7AM, please contact night-coverage [email or web address],  02/26/2021, 12:37 PM

## 2021-02-27 DIAGNOSIS — I5033 Acute on chronic diastolic (congestive) heart failure: Secondary | ICD-10-CM

## 2021-02-27 DIAGNOSIS — I5031 Acute diastolic (congestive) heart failure: Secondary | ICD-10-CM | POA: Diagnosis not present

## 2021-02-27 DIAGNOSIS — R0603 Acute respiratory distress: Secondary | ICD-10-CM

## 2021-02-27 DIAGNOSIS — I482 Chronic atrial fibrillation, unspecified: Secondary | ICD-10-CM

## 2021-02-27 DIAGNOSIS — I5032 Chronic diastolic (congestive) heart failure: Secondary | ICD-10-CM

## 2021-02-27 LAB — GLUCOSE, CAPILLARY
Glucose-Capillary: 113 mg/dL — ABNORMAL HIGH (ref 70–99)
Glucose-Capillary: 97 mg/dL (ref 70–99)
Glucose-Capillary: 164 mg/dL — ABNORMAL HIGH (ref 70–99)
Glucose-Capillary: 155 mg/dL — ABNORMAL HIGH (ref 70–99)

## 2021-02-27 MED ORDER — TORSEMIDE 20 MG PO TABS
40.0000 mg | ORAL_TABLET | Freq: Every day | ORAL | Status: DC
  Administered 2021-02-28: 40 mg via ORAL
  Filled 2021-02-27: qty 2

## 2021-02-27 NOTE — Progress Notes (Signed)
PROGRESS NOTE    [name]  [id number] DOB: 23-Nov-1934 DOA: 02/24/2021 PCP: [name], MD  Subjective: The patient was seen and examined this morning, lying comfortably in bed, complaining of mild abdominal discomfort.  On 1 L of oxygen, satting 98%.   Brief Narrative:   [name] is a 86 y.o. female with medical history significant for chronic diastolic CHF, obesity, CKD stage IV, type 2 diabetes, hypertension, and GERD who presented to the ED via EMS with worsening respiratory distress over the last 1-2 days.  She has been admitted with acute hypoxemic respiratory failure secondary to acute on chronic diastolic CHF exacerbation.  She is currently weaning off nitroglycerin drip and IV Lasix dose is being increased for further diuresis.  Cardiology evaluation pending.  Assessment & Plan:   Principal Problem:   Acute CHF ([place]) Active Problems:   DM II (diabetes mellitus, type II), controlled ([place])   Dysphagia, pharyngoesophageal phase   Hypertension   Acute on chronic diastolic CHF (congestive heart failure) (HCC)   CKD (chronic kidney disease), stage IIIb   Atrial fibrillation, chronic (HCC)   Chronic diastolic CHF (congestive heart failure) (HCC)/EF > 60 %   Acute respiratory distress   Acute hypoxemic respiratory failure secondary to acute on chronic diastolic CHF exacerbation (acute HFPEF) -Symptomatically improving  -Previous echo on 09/2020 with LVEF 55-60% with grade 2 diastolic dysfunction -Limited 2D echocardiogram with no wall motion abnormalities and LVEF 60-65% -Strict I's and O's -continue diuresing >-4.7 L since admission -Fluid restriction -Daily weights, with baseline near 200 lbs -but her current weight is near 223 LBS -Weaned off nitroglycerin drip -Following  On Lasix dosing to 80 mg twice daily 10/11 >> will be transition to p.o. torsemide 40 mg 02/27/2021 -With holding torsemide today due to elevated creatinine Cardiology recommended to  continue torsemide 40 mg p.o. daily on discharge  -Coreg 12.5 mg p.o. twice daily, hydralazine 1 mg p.o. 3 times daily, amlodipine 10 mg daily, -Following cardiology recommendation   Uncontrolled hypertension-improving -Continue amlodipine, hydralazine, Imdur -Started on nitroglycerin drip as noted above and wean to off -Status post IV Lasix, -BP improving, stabilizing   Type 2 diabetes with hyperglycemia -Hemoglobin A1c 8.8% -Maintain on insulin and SSI -Levemir dose increased to 27 units twice daily -CBG 112, 113, 97, discontinue daily 3 times daily insulin   Anemia -No overt bleeding noted -Anemia panel with no significant abnormalities -Monitor CBC   Obesity -Lifestyle changes outpatient   Debility -Wheelchair-bound at home -Does not want to go back to SNF  Cute on chronic kidney disease stage IIIa Elevated BUN to 46, creatinine 2.01 -Exacerbated by overdiuresis, holding torsemide today -We will avoid nephrotoxins  DVT prophylaxis: Heparin Code Status: DNI Family Communication: Daughter  Disposition Plan:  Status is: Inpatient  Remains inpatient appropriate because:Hemodynamically unstable, IV treatments appropriate due to intensity of illness or inability to take PO, and Inpatient level of care appropriate due to severity of illness  Dispo: The patient is from: Home              Anticipated d/c is to: Home with home health in a.m.              Patient currently is not medically stable to d/c.   Difficult to place patient No   Consultants:  Cardiology  Procedures:  See below  Antimicrobials:  None  Objective: Vitals:   02/27/21 0500 02/27/21 0916 02/27/21 0916 02/27/21 1241  BP: (!) 145/66 135/66 (!) 135/43 Marland Kitchen)  145/56  Pulse: 68  66 67  Resp: '18  18 17  '$ Temp: 98.2 F (36.8 C)  98.6 F (37 C) 97.7 F (36.5 C)  TempSrc: Oral  Oral Oral  SpO2: 96%  99% 98%  Weight: 97.6 kg     Height:        Intake/Output Summary (Last 24 hours) at 02/27/2021  1242 Last data filed at 02/27/2021 1013 Gross per 24 hour  Intake 240 ml  Output 800 ml  Net -560 ml   Filed Weights   02/25/21 0500 02/26/21 0432 02/27/21 0500  Weight: 101.3 kg 100 kg 97.6 kg       Physical Exam:   General:  Alert, oriented, cooperative, no distress;   HEENT:  Normocephalic, PERRL, otherwise with in Normal limits   Neuro:  CNII-XII intact. , normal motor and sensation, reflexes intact   Lungs:   Clear to auscultation BL, Respirations unlabored, no wheezes / crackles  Cardio:    S1/S2, RRR, No murmure, No Rubs or Gallops   Abdomen:   Soft, non-tender, bowel sounds active all four quadrants,  no guarding or peritoneal signs.  Muscular skeletal:  Limited exam - in bed, able to move all 4 extremities, global generalized weaknesses 2+ pulses,  symmetric, No pitting edema  Skin:  Dry, warm to touch, negative for any Rashes,  Wounds: Please see nursing documentation           Data Reviewed: I have personally reviewed following labs and imaging studies  CBC: Recent Labs  Lab 02/24/21 0855 02/25/21 0519 02/26/21 0424  WBC 15.8* 10.9* 9.4  NEUTROABS 7.1  --   --   HGB 11.2* 9.1* 9.0*  HCT 35.7* 28.6* 29.5*  MCV 91.8 91.4 92.2  PLT 321 241 [id number]   Basic Metabolic Panel: Recent Labs  Lab 02/24/21 0855 02/25/21 0519 02/26/21 0424  NA 140 137 140  K 3.5 3.8 3.6  CL 108 105 105  CO2 '25 26 29  '$ GLUCOSE 224* 315* 162*  BUN 31* 42* 46*  CREATININE 1.62* 1.86* 2.01*  CALCIUM 8.7* 8.2* 8.4*  MG 2.2 2.1 2.0    HbA1C: No results for input(s): HGBA1C in the last 72 hours.  CBG: Recent Labs  Lab 02/26/21 1135 02/26/21 1727 02/26/21 2133 02/27/21 0742 02/27/21 1121  GLUCAP 152* 144* 112* 113* 97   Lipid Profile: No results for input(s): CHOL, HDL, LDLCALC, TRIG, CHOLHDL, LDLDIRECT in the last 72 hours. Thyroid Function Tests: No results for input(s): TSH, T4TOTAL, FREET4, T3FREE, THYROIDAB in the last 72 hours.  Anemia Panel: No results for  input(s): VITAMINB12, FOLATE, FERRITIN, TIBC, IRON, RETICCTPCT in the last 72 hours.  Sepsis Labs: Recent Labs  Lab 02/24/21 1051  PROCALCITON <0.10    Recent Results (from the past 240 hour(s))  Resp Panel by RT-PCR (Flu A&B, Covid) Nasopharyngeal Swab     Status: None   Collection Time: 02/24/21  8:59 AM   Specimen: Nasopharyngeal Swab; Nasopharyngeal(NP) swabs in vial transport medium  Result Value Ref Range Status   SARS Coronavirus 2 by RT PCR NEGATIVE NEGATIVE Final    Comment: (NOTE) SARS-CoV-2 target nucleic acids are NOT DETECTED.  The SARS-CoV-2 RNA is generally detectable in upper respiratory specimens during the acute phase of infection. The lowest concentration of SARS-CoV-2 viral copies this assay can detect is 138 copies/mL. A negative result does not preclude SARS-Cov-2 infection and should not be used as the sole basis for treatment or other patient management decisions. A negative result  may occur with  improper specimen collection/handling, submission of specimen other than nasopharyngeal swab, presence of viral mutation(s) within the areas targeted by this assay, and inadequate number of viral copies(<138 copies/mL). A negative result must be combined with clinical observations, patient history, and epidemiological information. The expected result is Negative.  Fact Sheet for Patients:  EntrepreneurPulse.com.au  Fact Sheet for Healthcare Providers:  IncredibleEmployment.be  This test is no t yet approved or cleared by the [place] FDA and  has been authorized for detection and/or diagnosis of SARS-CoV-2 by FDA under an Emergency Use Authorization (EUA). This EUA will remain  in effect (meaning this test can be used) for the duration of the COVID-19 declaration under Section 564(b)(1) of the Act, 21 U.S.C.section 360bbb-3(b)(1), unless the authorization is terminated  or revoked sooner.       Influenza A by PCR  NEGATIVE NEGATIVE Final   Influenza B by PCR NEGATIVE NEGATIVE Final    Comment: (NOTE) The Xpert Xpress SARS-CoV-2/FLU/RSV plus assay is intended as an aid in the diagnosis of influenza from Nasopharyngeal swab specimens and should not be used as a sole basis for treatment. Nasal washings and aspirates are unacceptable for Xpert Xpress SARS-CoV-2/FLU/RSV testing.  Fact Sheet for Patients: EntrepreneurPulse.com.au  Fact Sheet for Healthcare Providers: IncredibleEmployment.be  This test is not yet approved or cleared by the [place] FDA and has been authorized for detection and/or diagnosis of SARS-CoV-2 by FDA under an Emergency Use Authorization (EUA). This EUA will remain in effect (meaning this test can be used) for the duration of the COVID-19 declaration under Section 564(b)(1) of the Act, 21 U.S.C. section 360bbb-3(b)(1), unless the authorization is terminated or revoked.  Performed at [facility], [address] [place], [place] [address]   MRSA Next Gen by PCR, Nasal     Status: None   Collection Time: 02/24/21  3:17 PM   Specimen: Nasal Mucosa; Nasal Swab  Result Value Ref Range Status   MRSA by PCR Next Gen NOT DETECTED NOT DETECTED Final    Comment: (NOTE) The GeneXpert MRSA Assay (FDA approved for NASAL specimens only), is one component of a comprehensive MRSA colonization surveillance program. It is not intended to diagnose MRSA infection nor to guide or monitor treatment for MRSA infections. Test performance is not FDA approved in patients less than 23 years old. Performed at [facility], [address], [place], [place] [address]          Radiology Studies: No results found.      Scheduled Meds:  amLODipine  10 mg Oral Daily   carvedilol  12.5 mg Oral BID WC   chlorhexidine  15 mL Mouth Rinse BID   diclofenac Sodium  2 g Topical QID   DULoxetine  30 mg Oral Daily   gabapentin  300 mg Oral TID   heparin   5,000 Units Subcutaneous Q8H   hydrALAZINE  100 mg Oral TID   insulin aspart  0-15 Units Subcutaneous TID WC   insulin aspart  0-5 Units Subcutaneous QHS   insulin detemir  27 Units Subcutaneous BID   mouth rinse  15 mL Mouth Rinse q12n4p   pantoprazole  40 mg Oral Daily   [START ON 02/28/2021] torsemide  40 mg Oral Daily   Continuous Infusions:     LOS: 3 days    Time spent: 35 minutes     SIGNED: Deatra James, MD, FHM. [facility],  Pager (please use [email or web address] to page/text)  Please use Epic  Secure Chat for non-urgent communication (7AM-7PM) If 7PM-7AM, please contact night-coverage [email or web address],  02/27/2021, 12:42 PM

## 2021-02-27 NOTE — Evaluation (Signed)
Occupational Therapy Evaluation Patient Details Name: QUANTAVIA LAMOTTE MRN: [id number] DOB: October 19, 1934 Today's Date: 02/27/2021   History of Present Illness NASTASIA PLACIDO is a 86 y.o. female with medical history significant for chronic diastolic CHF, obesity, CKD stage IV, type 2 diabetes, hypertension, and GERD who presented to the ED via EMS with worsening respiratory distress over the last 1-2 days.  She was initially noted to be slightly hypoxemic and was given some home supplemental oxygen by family members with some improvement.  She is noted to have some worsening cough and congestion today per EMS and was using accessory muscles to breathe.   Clinical Impression   Pt agreeable to OT evaluation, is wheelchair bound at baseline, uses sliding board to transfer between bed and wheelchair. Pt has BSC with drop arm that she directly transfers to. Pt is near her baseline with exception of SOB. Pt completing bed level activities with rest breaks as needed for SOB, reviewed PLB techniques. Pt's son and daughter assist with all ADLs at baseline. Recommend HH OT to evaluate functioning in the home on discharge.       Recommendations for follow up therapy are one component of a multi-disciplinary discharge planning process, led by the attending physician.  Recommendations may be updated based on patient status, additional functional criteria and insurance authorization.   Follow Up Recommendations  Home health OT    Equipment Recommendations  3 in 1 bedside commode       Precautions / Restrictions Precautions Precautions: Fall Restrictions Weight Bearing Restrictions: No      Mobility Bed Mobility               General bed mobility comments: Unable to complete due to SOB with exertion and pt fatigue    Transfers                 General transfer comment: Not completed        ADL either performed or assessed with clinical judgement   ADL Overall ADL's : Needs  assistance/impaired                                       General ADL Comments: Pt requires mod to max assist with ADLs at baseline. Limited in participation today due to fatigue.     Vision Baseline Vision/History: 0 No visual deficits Ability to See in Adequate Light: 0 Adequate Patient Visual Report: No change from baseline Vision Assessment?: No apparent visual deficits              Pertinent Vitals/Pain Pain Assessment: No/denies pain     Hand Dominance Right   Extremity/Trunk Assessment Upper Extremity Assessment Upper Extremity Assessment: Overall WFL for tasks assessed   Lower Extremity Assessment Lower Extremity Assessment: Defer to PT evaluation   Cervical / Trunk Assessment Cervical / Trunk Assessment: Kyphotic   Communication Communication Communication: No difficulties   Cognition   Behavior During Therapy: WFL for tasks assessed/performed Overall Cognitive Status: Within Functional Limits for tasks assessed                                                Home Living Family/patient expects to be discharged to:: Private residence Living Arrangements: Children (son and grandson) Available Help at Discharge:  Other (Comment) (has HH PT) Type of Home: House Home Access: Ramped entrance     Home Layout: One level     Bathroom Shower/Tub: Teacher, early years/pre: Handicapped height     Home Equipment: Environmental consultant - 2 wheels;Bedside commode;Wheelchair - manual;Tub bench;Hospital bed;Adaptive equipment;Other (comment) (slide board) Adaptive Equipment: Reacher;Sock aid        Prior Functioning/Environment Level of Independence: Needs assistance  Gait / Transfers Assistance Needed: Pt gets up during the day and uses a slide board to go from bed to her wheelchair. Son or daughter assists with bed mobility-pull her up ADL's / 62 Assistance Needed: Daughter assists with all ADLs, son completes meal  preparation. Pt reports she is in her wheelchair all day.   Comments: Pt reports she went to SNF after her CVA however was never able to ambulate or stand. Learned to use a slide board        OT Problem List: Decreased activity tolerance;Cardiopulmonary status limiting activity      OT Treatment/Interventions: Self-care/ADL training;Therapeutic exercise;DME and/or AE instruction;Patient/family education    OT Goals(Current goals can be found in the care plan section) Acute Rehab OT Goals Patient Stated Goal: To go home OT Goal Formulation: With patient Time For Goal Achievement: 03/13/21 Potential to Achieve Goals: Good  OT Frequency: Min 1X/week    AM-PAC OT "6 Clicks" Daily Activity     Outcome Measure Help from another person eating meals?: None Help from another person taking care of personal grooming?: A Lot Help from another person toileting, which includes using toliet, bedpan, or urinal?: A Lot Help from another person bathing (including washing, rinsing, drying)?: A Lot Help from another person to put on and taking off regular upper body clothing?: A Lot Help from another person to put on and taking off regular lower body clothing?: A Lot 6 Click Score: 14   End of Session Equipment Utilized During Treatment: Oxygen  Activity Tolerance: Patient tolerated treatment well;Other (comment) (limited by SOB) Patient left: in bed;with call bell/phone within reach;with bed alarm set  OT Visit Diagnosis: Muscle weakness (generalized) (M62.81)                Time: [id number] OT Time Calculation (min): 17 min Charges:  OT General Charges $OT Visit: 1 Visit OT Evaluation $OT Eval Low Complexity: [place], OTR/L  [phone] 02/27/2021, 4:22 PM

## 2021-02-27 NOTE — Plan of Care (Signed)
  Problem: Acute Rehab OT Goals (only OT should resolve) Goal: Pt. Will Perform Eating Flowsheets (Taken 02/27/2021 1625) Pt Will Perform Eating:  with modified independence  sitting Goal: Pt. Will Perform Grooming Flowsheets (Taken 02/27/2021 1625) Pt Will Perform Grooming:  with supervision  sitting Goal: Pt. Will Transfer To Toilet Flowsheets (Taken 02/27/2021 1625) Pt Will Transfer to Toilet:  with min guard assist  with transfer board  bedside commode Goal: Pt/Caregiver Will Perform Home Exercise Program Flowsheets (Taken 02/27/2021 1625) Pt/caregiver will Perform Home Exercise Program:  Increased strength  Both right and left upper extremity  Independently  With written HEP provided

## 2021-02-27 NOTE — Progress Notes (Signed)
Progress Note  Patient Name: [name] Date of Encounter: 02/27/2021  [facility] Cardiologist: Minus Breeding, MD   Subjective   Breathing improved. No orthopnea or PND overnight. No chest pain or palpitations.   Inpatient Medications    Scheduled Meds:  amLODipine  10 mg Oral Daily   carvedilol  12.5 mg Oral BID WC   chlorhexidine  15 mL Mouth Rinse BID   diclofenac Sodium  2 g Topical QID   DULoxetine  30 mg Oral Daily   gabapentin  300 mg Oral TID   heparin  5,000 Units Subcutaneous Q8H   hydrALAZINE  100 mg Oral TID   insulin aspart  0-15 Units Subcutaneous TID WC   insulin aspart  0-5 Units Subcutaneous QHS   insulin aspart  5 Units Subcutaneous TID WC   insulin detemir  27 Units Subcutaneous BID   mouth rinse  15 mL Mouth Rinse q12n4p   pantoprazole  40 mg Oral Daily   [START ON 02/28/2021] torsemide  40 mg Oral Daily   Continuous Infusions:  PRN Meds: acetaminophen **OR** acetaminophen, albuterol, hydrocortisone, ondansetron **OR** ondansetron (ZOFRAN) IV   Vital Signs    Vitals:   02/26/21 1500 02/26/21 1613 02/26/21 2131 02/27/21 0500  BP:  (!) 160/73 (!) 142/54 (!) 145/66  Pulse: 64 66 63 68  Resp: '16 16 18 18  '$ Temp:  98.4 F (36.9 C) 98.7 F (37.1 C) 98.2 F (36.8 C)  TempSrc:  Oral Oral Oral  SpO2: 97% 99% 93% 96%  Weight:    97.6 kg  Height:        Intake/Output Summary (Last 24 hours) at 02/27/2021 0909 Last data filed at 02/27/2021 0409 Gross per 24 hour  Intake 0 ml  Output 1750 ml  Net -1750 ml   Last 3 Weights 02/27/2021 02/26/2021 02/25/2021  Weight (lbs) 215 lb 2.7 oz 220 lb 7.4 oz 223 lb 5.2 oz  Weight (kg) 97.6 kg 100 kg 101.3 kg      Telemetry   NSR, HR in 60's to 70's. - Personally Reviewed  ECG    Sinus tachycardia, HR 108 with LBBB.  - Personally Reviewed  Physical Exam   GEN: Pleasant elderly female appearing in acute distress.   Neck: No JVD Cardiac: RRR, no murmurs, rubs, or gallops.  Respiratory:  Clear to auscultation bilaterally. No wheezing or rales.  GI: Soft, nontender, non-distended  MS: No pitting edema; No deformity. Neuro:  Nonfocal  Psych: Normal affect   Labs    High Sensitivity Troponin:   Recent Labs  Lab 02/24/21 0855 02/24/21 1048  TROPONINIHS 13 13     Chemistry Recent Labs  Lab 02/24/21 0855 02/25/21 0519 02/26/21 0424  NA 140 137 140  K 3.5 3.8 3.6  CL 108 105 105  CO2 '25 26 29  '$ GLUCOSE 224* 315* 162*  BUN 31* 42* 46*  CREATININE 1.62* 1.86* 2.01*  CALCIUM 8.7* 8.2* 8.4*  MG 2.2 2.1 2.0  GFRNONAA 31* 26* 24*  ANIONGAP '7 6 6    '$ Lipids No results for input(s): CHOL, TRIG, HDL, LABVLDL, LDLCALC, CHOLHDL in the last 168 hours.  Hematology Recent Labs  Lab 02/24/21 0855 02/24/21 1117 02/25/21 0519 02/26/21 0424  WBC 15.8*  --  10.9* 9.4  RBC 3.89 3.38* 3.13* 3.20*  HGB 11.2*  --  9.1* 9.0*  HCT 35.7*  --  28.6* 29.5*  MCV 91.8  --  91.4 92.2  MCH 28.8  --  29.1 28.1  MCHC 31.4  --  31.8 30.5  RDW 15.9*  --  15.6* 15.7*  PLT 321  --  241 221   Thyroid  Recent Labs  Lab 02/24/21 0855  TSH 3.675    BNP Recent Labs  Lab 02/24/21 0855  BNP 150.0*    DDimer No results for input(s): DDIMER in the last 168 hours.   Radiology    No results found.  Cardiac Studies   Limited Echocardiogram: 02/24/2021 IMPRESSIONS     1. Left ventricular ejection fraction, by estimation, is 60 to 65%. The  left ventricle has normal function. The left ventricle has no regional  wall motion abnormalities. There is moderate left ventricular hypertrophy.   2. Right ventricular systolic function is normal. The right ventricular  size is mildly enlarged.   Patient Profile     86 y.o. female w/ PMH of HFpEF, HTN, Type 2 DM, Stage 4 CKD, paroxysmal atrial fibrillation, LBBB and prior intraparenchymal brain hemorrhage who is currently admitted for acute hypoxic respiratory failure. Cardiology consulted for CHF.   Assessment & Plan    1. Acute  HFpEF - BNP was minimally elevated at 150 on admission but CXR showed pulmonary edema consistent with CHF. Repeat limited echo shows EF remains preserved at 60-65%. Received intermittent doses of IV Lasix with a recorded net output of -6.5 L thus far. Variable weights recorded but at 225 lbs on 10/10 and now down to 215 lbs (unknown baseline as she was at 246 lbs in 12/2019).   - She was on Lasix '40mg'$  daily prior to admission and this has been transitioned to Torsemide '40mg'$  daily for improved bioavailability scheduled to begin tomorrow given rise in creatinine from 1.86 to 2.01. Will need outpatient BMET in 7-10 days.    2. Accelerated HTN - BP has been variable from 142/54 - 189/56 within the past 24 hours, improved to 135/43 on most recent check. She has been continued on Hydralazine '100mg'$  TID and Amlodipine '10mg'$  daily with Toprol-XL being transitioned to Coreg 12.'5mg'$  BID. This can be further titrated if needed as an outpatient.   3. Paroxysmal Atrial Fibrillation - She has maintained NSR this admission. Was not on anticoagulation prior to admission given her history of ICH.   4. Stage 4 CKD - Baseline creatinine 1.8 - 2.0. Creatinine at 2.01 on 02/26/2021. Will need a repeat BMET in 7-10 days following hospital discharge.   For questions or updates, please contact [place] Please consult [email or web address] for contact info under        Signed, Erma Heritage, PA-C  02/27/2021, 9:09 AM

## 2021-02-28 DIAGNOSIS — I5033 Acute on chronic diastolic (congestive) heart failure: Secondary | ICD-10-CM | POA: Diagnosis not present

## 2021-02-28 DIAGNOSIS — I150 Renovascular hypertension: Secondary | ICD-10-CM

## 2021-02-28 DIAGNOSIS — I482 Chronic atrial fibrillation, unspecified: Secondary | ICD-10-CM | POA: Diagnosis not present

## 2021-02-28 DIAGNOSIS — N183 Chronic kidney disease, stage 3 unspecified: Secondary | ICD-10-CM

## 2021-02-28 DIAGNOSIS — I5031 Acute diastolic (congestive) heart failure: Secondary | ICD-10-CM | POA: Diagnosis not present

## 2021-02-28 DIAGNOSIS — R1314 Dysphagia, pharyngoesophageal phase: Secondary | ICD-10-CM

## 2021-02-28 DIAGNOSIS — R0603 Acute respiratory distress: Secondary | ICD-10-CM | POA: Diagnosis not present

## 2021-02-28 LAB — BASIC METABOLIC PANEL
Anion gap: 7 (ref 5–15)
BUN: 40 mg/dL — ABNORMAL HIGH (ref 8–23)
CO2: 29 mmol/L (ref 22–32)
Calcium: 8.4 mg/dL — ABNORMAL LOW (ref 8.9–10.3)
Chloride: 101 mmol/L (ref 98–111)
Creatinine, Ser: 1.58 mg/dL — ABNORMAL HIGH (ref 0.44–1.00)
GFR, Estimated: 32 mL/min — ABNORMAL LOW (ref >60)
Glucose, Bld: 177 mg/dL — ABNORMAL HIGH (ref 70–99)
Potassium: 3.6 mmol/L (ref 3.5–5.1)
Sodium: 137 mmol/L (ref 135–145)

## 2021-02-28 LAB — GLUCOSE, CAPILLARY: Glucose-Capillary: 169 mg/dL — ABNORMAL HIGH (ref 70–99)

## 2021-02-28 MED ORDER — TORSEMIDE 20 MG PO TABS: 20.0000 mg | ORAL_TABLET | Freq: Two times a day (BID) | ORAL | 3 refills | Status: DC

## 2021-02-28 MED ORDER — POTASSIUM CHLORIDE CRYS ER 10 MEQ PO TBCR: 10.0000 meq | EXTENDED_RELEASE_TABLET | Freq: Two times a day (BID) | ORAL | 2 refills | Status: DC

## 2021-02-28 MED ORDER — CARVEDILOL 12.5 MG PO TABS: 12.5000 mg | ORAL_TABLET | Freq: Two times a day (BID) | ORAL | 2 refills | Status: DC

## 2021-02-28 NOTE — Evaluation (Signed)
Physical Therapy Evaluation Patient Details Name: [name] MRN: [id number] DOB: 1935-01-14 Today's Date: 02/28/2021  History of Present Illness  RELIA [name] is a 86 y.o. female with medical history significant for chronic diastolic CHF, obesity, CKD stage IV, type 2 diabetes, hypertension, and GERD who presented to the ED via EMS with worsening respiratory distress over the last 1-2 days.  [name] was initially noted to be slightly hypoxemic and was given some home supplemental oxygen by family members with some improvement.  [name] is noted to have some worsening cough and congestion today per EMS and was using accessory muscles to breathe.   Clinical Impression  Patient functioning near baseline but is limited for functional mobility as stated below secondary to BLE weakness, fatigue. Patient requires min/mod assist to transition to seated EOB. Demonstrates good sitting tolerance and sitting balance and is set up for breakfast. Patient will benefit from continued physical therapy in hospital and recommended venue below to increase strength, balance, endurance for safe ADLs and gait.        Recommendations for follow up therapy are one component of a multi-disciplinary discharge planning process, led by the attending physician.  Recommendations may be updated based on patient status, additional functional criteria and insurance authorization.  Follow Up Recommendations Home health PT    Equipment Recommendations  None recommended by PT    Recommendations for Other Services       Precautions / Restrictions Precautions Precautions: Fall Restrictions Weight Bearing Restrictions: No      Mobility  Bed Mobility Overal bed mobility: Needs Assistance Bed Mobility: Supine to Sit     Supine to sit: Min assist;Mod assist     General bed mobility comments: requires assist for RLE movement and to pull to seated EOB; slow, labored movements    Transfers                     Ambulation/Gait                Stairs            Wheelchair Mobility    Modified Rankin (Stroke Patients Only)       Balance Overall balance assessment: Needs assistance Sitting-balance support: Feet supported;No upper extremity supported Sitting balance-Leahy Scale: Good Sitting balance - Comments: seated EOB                                     Pertinent Vitals/Pain Pain Assessment: No/denies pain    Home Living Family/patient expects to be discharged to:: Private residence Living Arrangements: Children (son and grandson) Available Help at Discharge: Other (Comment) (has HH PT) Type of Home: House Home Access: Ramped entrance     Home Layout: One level Home Equipment: Maud - 2 wheels;Bedside commode;Wheelchair - manual;Tub bench;Hospital bed;Adaptive equipment;Other (comment) (slide board)      Prior Function Level of Independence: Needs assistance   Gait / Transfers Assistance Needed: Pt gets up during the day and uses a slide board to go from bed to her wheelchair. Son or daughter assists with bed mobility-pull her up  ADL's / 27 Assistance Needed: Daughter assists with all ADLs, son completes meal preparation. Pt reports [name] is in her wheelchair all day.  Comments: Pt reports [name] went to SNF after her CVA however was never able to ambulate or stand. Learned to use a slide board     Hand  Dominance   Dominant Hand: Right    Extremity/Trunk Assessment   Upper Extremity Assessment Upper Extremity Assessment: Defer to OT evaluation    Lower Extremity Assessment Lower Extremity Assessment: Generalized weakness    Cervical / Trunk Assessment Cervical / Trunk Assessment: Kyphotic  Communication   Communication: No difficulties  Cognition Arousal/Alertness: Awake/alert Behavior During Therapy: WFL for tasks assessed/performed Overall Cognitive Status: Within Functional Limits for tasks assessed                                         General Comments      Exercises     Assessment/Plan    PT Assessment Patient needs continued PT services  PT Problem List Decreased strength;Decreased mobility;Decreased activity tolerance;Decreased balance       PT Treatment Interventions DME instruction;Therapeutic exercise;Gait training;Balance training;Stair training;Neuromuscular re-education;Functional mobility training;Therapeutic activities;Patient/family education    PT Goals (Current goals can be found in the Care Plan section)  Acute Rehab PT Goals Patient Stated Goal: To go home PT Goal Formulation: With patient Time For Goal Achievement: 03/07/21 Potential to Achieve Goals: Good    Frequency Min 3X/week   Barriers to discharge        Co-evaluation               AM-PAC PT "6 Clicks" Mobility  Outcome Measure Help needed turning from your back to your side while in a flat bed without using bedrails?: A Little Help needed moving from lying on your back to sitting on the side of a flat bed without using bedrails?: A Little Help needed moving to and from a bed to a chair (including a wheelchair)?: A Lot Help needed standing up from a chair using your arms (e.g., wheelchair or bedside chair)?: A Lot Help needed to walk in hospital room?: A Lot Help needed climbing 3-5 steps with a railing? : Total 6 Click Score: 13    End of Session   Activity Tolerance: Patient tolerated treatment well Patient left: in bed;with call bell/phone within reach;with bed alarm set Nurse Communication: Mobility status PT Visit Diagnosis: Unsteadiness on feet (R26.81);Other abnormalities of gait and mobility (R26.89);Muscle weakness (generalized) (M62.81);Difficulty in walking, not elsewhere classified (R26.2)    Time: [id number] PT Time Calculation (min) (ACUTE ONLY): 10 min   Charges:   PT Evaluation $PT Eval Low Complexity: 1 Low          8:26 AM, 02/28/21 Mearl Latin PT,  DPT Physical Therapist at [facility]

## 2021-02-28 NOTE — Progress Notes (Signed)
    The patient has been transitioned to PO diuretics with Torsemide '40mg'$  daily. Cardiology follow-up has been arranged for 03/07/2021 at our [facility] office and she will need a repeat BMET at that time.   [facility] will sign off.   Medication Recommendations: Continue Amlodipine, Coreg, Hydralazine and Torsemide at current dosing. Other recommendations (labs, testing, etc): BMET in 7 days.  Follow up as an outpatient: Scheduled for 03/07/2021 and included in AVS.   Signed, Erma Heritage, PA-C 02/28/2021, 8:32 AM Pager: [phone]

## 2021-02-28 NOTE — Discharge Summary (Signed)
Physician Discharge Summary [facility]    Patient: [name]                   Admit date: 02/24/2021   DOB: [birth year]             Discharge date:02/28/2021/10:38 AM [id number]                          PCP: [name], MD  Disposition: HOME with Home Health   Recommendations for Outpatient Follow-up:   Follow up: With cardiology within 2 weeks, current medication needs to be adjusted for better fluid volume control, also BP meds will be changed for better blood pressure control Follow-up with PCP within 2-3 weeks Continue daily weight..  Notify your heart failure team, cardiologist if > 3-5 pounds weight gain within 24 hours along with excessive swelling, symptoms of shortness of breath Continue checking blood sugars up to 3-4 times a day if possible, continue current diabetic medication insulin regimen,-strict carb modified diet Home health has been arranged for PT OT Instructed on fall precautions  Discharge Condition: Stable   Code Status:   Code Status: Partial Code  Diet recommendation: Diabetic diet   Discharge Diagnoses:    Principal Problem:   Acute CHF ([place]) Active Problems:   DM II (diabetes mellitus, type II), controlled ([place])   Dysphagia, pharyngoesophageal phase   Hypertension   Acute on chronic diastolic CHF (congestive heart failure) ([place])   CKD (chronic kidney disease), stage IIIb   Atrial fibrillation, chronic (H[facility])   Chronic diastolic CHF (congestive heart failure) ([facility])/EF > 60 %   Acute respiratory distress   History of Present Illness/ Hospital Course [name] Summary:   LAKISKA FEIMSTER is a 86 y.o. female with medical history significant for chronic diastolic CHF, obesity, CKD stage IV, type 2 diabetes, hypertension, and GERD who presented to the ED via EMS with worsening respiratory distress over the last 1-2 days.  She has been admitted with acute hypoxemic respiratory failure secondary to acute on chronic diastolic CHF  exacerbation.  She is currently weaning off nitroglycerin drip and IV Lasix dose is being increased for further diuresis.  Cardiology evaluation pending.      Acute hypoxemic respiratory failure secondary to acute on chronic diastolic CHF exacerbation (acute HFPEF) -Symptomatically improving   -Previous echo on 09/2020 with LVEF 55-60% with grade 2 diastolic dysfunction -Limited 2D echocardiogram with no wall motion abnormalities and LVEF 60-65% -Strict I's and O's -continue diuresing >-4.7 L since admission -Fluid restriction -Daily weights, with baseline near 200 lbs -but her current weight is near 223 LBS -Weaned off nitroglycerin drip -Following  On Lasix dosing to 80 mg twice daily 10/11 >> will be transition to p.o. torsemide 40 mg 02/27/2021 -Resuming torsemide 20 mg p.o. twice daily-total 40 mg daily Cardiology recommended to continue torsemide 40 mg p.o. daily on discharge   -Coreg 12.5 mg p.o. twice daily, hydralazine 1 mg p.o. 3 times daily, amlodipine 10 mg daily, -Following cardiology recommendation   Uncontrolled hypertension-improving -Continue amlodipine, hydralazine, Imdur -Started on nitroglycerin drip as noted above and wean to off -Status post IV Lasix,>> started on torsemide 20 mg p.o. twice daily total of 40 mg daily -BP improving, stabilizing   Type 2 diabetes with hyperglycemia -Hemoglobin A1c 8.8% -Maintain on insulin and SSI -Levemir dose increased to 27 units twice daily -CBG 112, 113, 97, discontinue daily 3 times daily insulin   Anemia -No  overt bleeding noted, H&H remained stable -Anemia panel with no significant abnormalities    Obesity -Lifestyle changes outpatient   Debility -Wheelchair-bound at home -Does not want to go back to SNF   Cute on chronic kidney disease stage IIIa Elevated BUN to 46, creatinine 2.01 -Resuming torsemide 20 mg p.o. twice daily Avoid nephrotoxins    Code Status: DNI Family Communication: Daughter  Disposition  Plan:    Dispo: The patient is from: Home              Anticipated d/c is to: Home with home health      Discharge Instructions:   Discharge Instructions     Activity as tolerated - No restrictions   Complete by: As directed    Diet Carb Modified   Complete by: As directed    Discharge instructions   Complete by: As directed    Follow-up with PCP within 1 week.  Follow-up with cardiologist within 2 weeks, continue current medication, current heart medications including blood pressure medication needs to be adjusted for better volume fluid control and pressure.  Continue monitor blood sugar very closely   Increase activity slowly   Complete by: As directed         Medication List     STOP taking these medications    diclofenac Sodium 1 % Gel Commonly known as: VOLTAREN   furosemide 40 MG tablet Commonly known as: LASIX   isosorbide mononitrate 60 MG 24 hr tablet Commonly known as: IMDUR   metoprolol succinate 25 MG 24 hr tablet Commonly known as: TOPROL-XL       TAKE these medications    acetaminophen 325 MG tablet Commonly known as: TYLENOL Take 2 tablets (650 mg total) by mouth every 6 (six) hours as needed for mild pain, fever or headache (or Fever >/= 101).   amLODipine 10 MG tablet Commonly known as: NORVASC Take 1 tablet (10 mg total) by mouth daily.   carvedilol 12.5 MG tablet Commonly known as: COREG Take 1 tablet (12.5 mg total) by mouth 2 (two) times daily with a meal.   DULoxetine 30 MG capsule Commonly known as: CYMBALTA Take 1 capsule (30 mg total) by mouth daily.   gabapentin 300 MG capsule Commonly known as: NEURONTIN Take 300 mg by mouth 3 (three) times daily.   hydrALAZINE 100 MG tablet Commonly known as: APRESOLINE Take 1 tablet (100 mg total) by mouth 3 (three) times daily.   hydrocortisone 2.5 % rectal cream Commonly known as: ANUSOL-HC Place 1 application rectally 2 (two) times daily as needed for hemorrhoids or anal  itching.   insulin aspart 100 UNIT/ML FlexPen Commonly known as: NOVOLOG Inject 0-10 Units into the skin 3 (three) times daily with meals. insulin aspart (novoLOG) injection 0-10 Units 0-10 Units Subcutaneous, 3 times daily with meals CBG < 70: Implement Hypoglycemia Standing Orders and refer to Hypoglycemia Standing Orders sidebar report  CBG 70 - 120: 0 unit CBG 121 - 150: 0 unit  CBG 151 - 200: 1 unit CBG 201 - 250: 2 units CBG 251 - 300: 4 units CBG 301 - 350: 6 units  CBG 351 - 400: 8 units  CBG > 400: 10 units What changed: Another medication with the same name was removed. Continue taking this medication, and follow the directions you see here.   insulin detemir 100 UNIT/ML injection Commonly known as: LEVEMIR Inject 0.22 mLs (22 Units total) into the skin 2 (two) times daily. What changed:  how much to  take when to take this   pantoprazole 40 MG tablet Commonly known as: PROTONIX Take 1 tablet by mouth daily.   potassium chloride 10 MEQ tablet Commonly known as: KLOR-CON Take 1 tablet (10 mEq total) by mouth 2 (two) times daily.   simvastatin 20 MG tablet Commonly known as: ZOCOR Take 20 mg by mouth at bedtime.   torsemide 20 MG tablet Commonly known as: DEMADEX Take 1 tablet (20 mg total) by mouth 2 (two) times daily. What changed: when to take this        Follow-up Information     Verta Ellen., NP Follow up on 03/07/2021.   Specialty: Cardiology Why: Cardiology Hospital Follow-up on 03/07/2021 at 1:30 PM in the [facility]. Please call the office if needing a different date or time. Contact information: [place] [address] [phone]                No Known Allergies   Procedures /Studies:   DG Chest Port 1 View  Result Date: 02/24/2021 CLINICAL DATA:  Shortness of breath.  Respiratory distress. EXAM: PORTABLE CHEST 1 VIEW COMPARISON:  Radiographs 10/12/2020 and 10/11/2020.  CT 07/23/2019. FINDINGS: 0918 hours.  Stable cardiomegaly. There is recurrent pulmonary edema without confluent airspace opacity, significant pleural effusion or pneumothorax. Prominent left lower thoracic paraspinal osteophyte appears unchanged. No acute osseous findings. Telemetry leads overlie the chest. IMPRESSION: Cardiomegaly with recurrent pulmonary edema consistent with congestive heart failure. Electronically Signed   By: Richardean Sale M.D.   On: 02/24/2021 09:49   ECHOCARDIOGRAM LIMITED  Result Date: 02/24/2021    ECHOCARDIOGRAM LIMITED REPORT   Patient Name:   ELDA HARDMON Date of Exam: 02/24/2021 Medical Rec #:  [id number]    Height:       64.0 in Accession #:    [id number]   Weight:       225.1 lb Date of Birth:  06-Sep-1934     BSA:          2.057 m Patient Age:    86 years     BP:           161/36 mmHg Patient Gender: F            HR:           80 bpm. Exam Location:  [facility] Procedure: Limited Echo Indications:    CHF-Acute Diastolic [id number]  History:        Patient has prior history of Echocardiogram examinations, most                 recent 09/25/2020. CHF, Stroke, Arrythmias:Atrial Fibrillation;                 Risk Factors:Hypertension and Diabetes. Hx of Pneumonia due to                 COVID-19 virus.  Sonographer:    Alvino Chapel RCS Referring Phys: [id number] Royanne Foots [facility]  Sonographer Comments: Patient on BiPAP throughout echo exam. Could not sniff on BiPAP. IMPRESSIONS  1. Left ventricular ejection fraction, by estimation, is 60 to 65%. The left ventricle has normal function. The left ventricle has no regional wall motion abnormalities. There is moderate left ventricular hypertrophy.  2. Right ventricular systolic function is normal. The right ventricular size is mildly enlarged. FINDINGS  Left Ventricle: Left ventricular ejection fraction, by estimation, is 60 to 65%. The left ventricle has normal function. The left ventricle has  no regional wall motion abnormalities. The left ventricular internal cavity size was normal in  size. There is  moderate left ventricular hypertrophy. Right Ventricle: The right ventricular size is mildly enlarged. No increase in right ventricular wall thickness. Right ventricular systolic function is normal. Pericardium: Trivial pericardial effusion is present. Aorta: The aortic root is normal in size and structure. LEFT VENTRICLE PLAX 2D LVIDd:         4.60 cm LVIDs:         2.80 cm LV PW:         1.10 cm LV IVS:        1.10 cm LVOT diam:     1.80 cm LVOT Area:     2.54 cm  LEFT ATRIUM         Index LA diam:    3.70 cm 1.80 cm/m   AORTA Ao Root diam: 3.00 cm  SHUNTS Systemic Diam: 1.80 cm Oswaldo Milian MD Electronically signed by Oswaldo Milian MD Signature Date/Time: 02/24/2021/6:47:41 PM    Final     Subjective:   Patient was seen and examined 02/28/2021, 10:38 AM Patient stable today. No acute distress.  No issues overnight Stable for discharge.  Discharge Exam:    Vitals:   02/27/21 1953 02/28/21 0500 02/28/21 0543 02/28/21 1034  BP: (!) 146/78  (!) 152/57 (!) 151/61  Pulse: 72  65 70  Resp: 19  19   Temp: 99.2 F (37.3 C)  98 F (36.7 C)   TempSrc: Oral  Oral   SpO2: (!) 89%  93%   Weight:  98.3 kg    Height:        General: Pt lying comfortably in bed & appears in no obvious distress. Cardiovascular: S1 & S2 heard, RRR, S1/S2 +. No murmurs, rubs, gallops or clicks. No JVD or pedal edema. Respiratory: Clear to auscultation without wheezing, rhonchi or crackles. No increased work of breathing. Abdominal:  Non-distended, non-tender & soft. No organomegaly or masses appreciated. Normal bowel sounds heard. CNS: Alert and oriented. No focal deficits. Extremities: no edema, no cyanosis, global generalized weaknesses      The results of significant diagnostics from this hospitalization (including imaging, microbiology, ancillary and laboratory) are listed below for reference.      Microbiology:   Recent Results (from the past 240 hour(s))  Resp Panel by  RT-PCR (Flu A&B, Covid) Nasopharyngeal Swab     Status: None   Collection Time: 02/24/21  8:59 AM   Specimen: Nasopharyngeal Swab; Nasopharyngeal(NP) swabs in vial transport medium  Result Value Ref Range Status   SARS Coronavirus 2 by RT PCR NEGATIVE NEGATIVE Final    Comment: (NOTE) SARS-CoV-2 target nucleic acids are NOT DETECTED.  The SARS-CoV-2 RNA is generally detectable in upper respiratory specimens during the acute phase of infection. The lowest concentration of SARS-CoV-2 viral copies this assay can detect is 138 copies/mL. A negative result does not preclude SARS-Cov-2 infection and should not be used as the sole basis for treatment or other patient management decisions. A negative result may occur with  improper specimen collection/handling, submission of specimen other than nasopharyngeal swab, presence of viral mutation(s) within the areas targeted by this assay, and inadequate number of viral copies(<138 copies/mL). A negative result must be combined with clinical observations, patient history, and epidemiological information. The expected result is Negative.  Fact Sheet for Patients:  EntrepreneurPulse.com.au  Fact Sheet for Healthcare Providers:  IncredibleEmployment.be  This test is no t yet approved or cleared by the  [place] Architectural technologist and  has been authorized for detection and/or diagnosis of SARS-CoV-2 by FDA under an Print production planner (EUA). This EUA will remain  in effect (meaning this test can be used) for the duration of the COVID-19 declaration under Section 564(b)(1) of the Act, 21 U.S.C.section 360bbb-3(b)(1), unless the authorization is terminated  or revoked sooner.       Influenza A by PCR NEGATIVE NEGATIVE Final   Influenza B by PCR NEGATIVE NEGATIVE Final    Comment: (NOTE) The Xpert Xpress SARS-CoV-2/FLU/RSV plus assay is intended as an aid in the diagnosis of influenza from Nasopharyngeal swab  specimens and should not be used as a sole basis for treatment. Nasal washings and aspirates are unacceptable for Xpert Xpress SARS-CoV-2/FLU/RSV testing.  Fact Sheet for Patients: EntrepreneurPulse.com.au  Fact Sheet for Healthcare Providers: IncredibleEmployment.be  This test is not yet approved or cleared by the [place] FDA and has been authorized for detection and/or diagnosis of SARS-CoV-2 by FDA under an Emergency Use Authorization (EUA). This EUA will remain in effect (meaning this test can be used) for the duration of the COVID-19 declaration under Section 564(b)(1) of the Act, 21 U.S.C. section 360bbb-3(b)(1), unless the authorization is terminated or revoked.  Performed at [facility], [address], [place], [place] [address]   MRSA Next Gen by PCR, Nasal     Status: None   Collection Time: 02/24/21  3:17 PM   Specimen: Nasal Mucosa; Nasal Swab  Result Value Ref Range Status   MRSA by PCR Next Gen NOT DETECTED NOT DETECTED Final    Comment: (NOTE) The GeneXpert MRSA Assay (FDA approved for NASAL specimens only), is one component of a comprehensive MRSA colonization surveillance program. It is not intended to diagnose MRSA infection nor to guide or monitor treatment for MRSA infections. Test performance is not FDA approved in patients less than 27 years old. Performed at [facility], [address], [place], [place] [address]      Labs:   CBC: Recent Labs  Lab 02/24/21 0855 02/25/21 0519 02/26/21 0424  WBC 15.8* 10.9* 9.4  NEUTROABS 7.1  --   --   HGB 11.2* 9.1* 9.0*  HCT 35.7* 28.6* 29.5*  MCV 91.8 91.4 92.2  PLT 321 241 [id number]   Basic Metabolic Panel: Recent Labs  Lab 02/24/21 0855 02/25/21 0519 02/26/21 0424 02/28/21 0855  NA 140 137 140 137  K 3.5 3.8 3.6 3.6  CL 108 105 105 101  CO2 '25 26 29 29  '$ GLUCOSE 224* 315* 162* 177*  BUN 31* 42* 46* 40*  CREATININE 1.62* 1.86* 2.01* 1.58*  CALCIUM 8.7* 8.2*  8.4* 8.4*  MG 2.2 2.1 2.0  --    Liver Function Tests: No results for input(s): AST, ALT, ALKPHOS, BILITOT, PROT, ALBUMIN in the last 168 hours. BNP (last 3 results) Recent Labs    09/24/20 2137 02/24/21 0855  BNP 101.0* 150.0*   Cardiac Enzymes: No results for input(s): CKTOTAL, CKMB, CKMBINDEX, TROPONINI in the last 168 hours. CBG: Recent Labs  Lab 02/27/21 0742 02/27/21 1121 02/27/21 1628 02/27/21 1953 02/28/21 0721  GLUCAP 113* 97 164* 155* 169*    Urinalysis    Component Value Date/Time   COLORURINE YELLOW 09/25/2020 0806   APPEARANCEUR HAZY (A) 09/25/2020 0806   LABSPEC 1.024 09/25/2020 0806   PHURINE 5.0 09/25/2020 0806   GLUCOSEU >=500 (A) 09/25/2020 0806   HGBUR NEGATIVE 09/25/2020 0806   BILIRUBINUR NEGATIVE 09/25/2020 0806   KETONESUR NEGATIVE 09/25/2020 [id number]  PROTEINUR 100 (A) 09/25/2020 0806   UROBILINOGEN 0.2 02/20/2015 1200   NITRITE NEGATIVE 09/25/2020 0806   LEUKOCYTESUR NEGATIVE 09/25/2020 0806         Time coordinating discharge: Over 45 minutes  SIGNED: Deatra James, MD, FACP, [facility]. [facility],  Please use [email or web address] to Page If 7PM-7AM, please contact night-coverage [email or web address] 02/28/2021, 10:38 AM

## 2021-02-28 NOTE — Plan of Care (Signed)
  Problem: Acute Rehab PT Goals(only PT should resolve) Goal: Pt Will Go Supine/Side To Sit Outcome: Progressing Flowsheets (Taken 02/28/2021 0827) Pt will go Supine/Side to Sit: with minimal assist Goal: Pt Will Go Sit To Supine/Side Outcome: Progressing Flowsheets (Taken 02/28/2021 0827) Pt will go Sit to Supine/Side: with minimal assist Goal: Pt Will Transfer Bed To Chair/Chair To Bed Outcome: Progressing Flowsheets (Taken 02/28/2021 0827) Pt will Transfer Bed to Chair/Chair to Bed:  with min assist  with mod assist  8:28 AM, 02/28/21 Mearl Latin PT, DPT Physical Therapist at [facility]

## 2021-02-28 NOTE — Care Management Important Message (Signed)
Important Message  Patient Details  Name: [name] MRN: [id number] Date of Birth: 04/01/1935   Medicare Important Message Given:  Yes     Tommy Medal 02/28/2021, 11:16 AM

## 2021-02-28 NOTE — TOC Transition Note (Signed)
Transition of Care [facility]) - CM/SW Discharge Note   Patient Details  Name: [name] MRN: [id number] Date of Birth: 1935-04-26  Transition of Care [facility]) CM/SW Contact:  Boneta Lucks, RN Phone Number: 02/28/2021, 10:52 AM   Clinical Narrative:   Patient discharging home today. Home health orders for PT/OT. Marjory Lies with [facility] updated.   Final next level of care: [place] Barriers to Discharge: Barriers Resolved  Patient Goals and CMS Choice Patient states their goals for this hospitalization and ongoing recovery are:: to go home. CMS Medicare.gov Compare Post Acute Care list provided to:: Patient    Discharge Placement                 Patient and family notified of of transfer: 02/28/21  Discharge Plan and Services    [place]: [place] Date [place]: 02/28/21 Time [place]: [id number] Representative spoke with at [place]: Marjory Lies  Readmission Risk Interventions Readmission Risk Prevention Plan 02/28/2021 02/09/2020 07/27/2019  Transportation Screening Complete Complete Complete  PCP or Specialist Appt within 3-5 Days Complete - -  Medication Review (RN CM) - - Complete  HRI or Home Care Consult Complete Complete -  Social Work Consult for [place] Planning/Counseling Complete Complete -  Palliative Care Screening Not Applicable Not Applicable -  Medication Review Press [employer]) Complete Complete -  Some recent data might be hidden

## 2021-03-03 ENCOUNTER — Other Ambulatory Visit: Payer: Self-pay

## 2021-03-03 NOTE — Patient Outreach (Signed)
[facility] [facility]) Care Management  03/03/2021  [name] 1934-06-01 [id number]   Referral Date: 03/03/21 Referral Source: [employer] Report Date of Discharge: 02/28/21 Facility:  [place]: [facility]   Referral received.  Transition of Care calls being completed via EMMI-automated calls.     Plan: RN CM will close case.    [name], RN, MSN [facility] Care Management Care Management Coordinator Direct Line [phone] Toll Free: [phone]  Fax: [phone]

## 2021-03-05 DIAGNOSIS — R200 Anesthesia of skin: Secondary | ICD-10-CM | POA: Diagnosis not present

## 2021-03-05 DIAGNOSIS — R112 Nausea with vomiting, unspecified: Secondary | ICD-10-CM | POA: Diagnosis not present

## 2021-03-05 DIAGNOSIS — I959 Hypotension, unspecified: Secondary | ICD-10-CM | POA: Diagnosis not present

## 2021-03-05 DIAGNOSIS — R110 Nausea: Secondary | ICD-10-CM | POA: Diagnosis not present

## 2021-03-05 DIAGNOSIS — E1140 Type 2 diabetes mellitus with diabetic neuropathy, unspecified: Secondary | ICD-10-CM | POA: Diagnosis not present

## 2021-03-05 DIAGNOSIS — I5032 Chronic diastolic (congestive) heart failure: Secondary | ICD-10-CM | POA: Diagnosis not present

## 2021-03-05 DIAGNOSIS — R1111 Vomiting without nausea: Secondary | ICD-10-CM | POA: Diagnosis not present

## 2021-03-05 DIAGNOSIS — N183 Chronic kidney disease, stage 3 unspecified: Secondary | ICD-10-CM | POA: Diagnosis not present

## 2021-03-05 DIAGNOSIS — G8929 Other chronic pain: Secondary | ICD-10-CM | POA: Diagnosis not present

## 2021-03-05 DIAGNOSIS — I130 Hypertensive heart and chronic kidney disease with heart failure and stage 1 through stage 4 chronic kidney disease, or unspecified chronic kidney disease: Secondary | ICD-10-CM | POA: Diagnosis not present

## 2021-03-05 DIAGNOSIS — E1122 Type 2 diabetes mellitus with diabetic chronic kidney disease: Secondary | ICD-10-CM | POA: Diagnosis not present

## 2021-03-05 DIAGNOSIS — I4891 Unspecified atrial fibrillation: Secondary | ICD-10-CM | POA: Diagnosis not present

## 2021-03-05 DIAGNOSIS — D631 Anemia in chronic kidney disease: Secondary | ICD-10-CM | POA: Diagnosis not present

## 2021-03-05 DIAGNOSIS — R001 Bradycardia, unspecified: Secondary | ICD-10-CM | POA: Diagnosis not present

## 2021-03-06 ENCOUNTER — Telehealth: Payer: Self-pay | Admitting: Family Medicine

## 2021-03-06 DIAGNOSIS — R200 Anesthesia of skin: Secondary | ICD-10-CM | POA: Diagnosis not present

## 2021-03-06 DIAGNOSIS — G8929 Other chronic pain: Secondary | ICD-10-CM | POA: Diagnosis not present

## 2021-03-06 DIAGNOSIS — I5032 Chronic diastolic (congestive) heart failure: Secondary | ICD-10-CM | POA: Diagnosis not present

## 2021-03-06 DIAGNOSIS — I130 Hypertensive heart and chronic kidney disease with heart failure and stage 1 through stage 4 chronic kidney disease, or unspecified chronic kidney disease: Secondary | ICD-10-CM | POA: Diagnosis not present

## 2021-03-06 DIAGNOSIS — D631 Anemia in chronic kidney disease: Secondary | ICD-10-CM | POA: Diagnosis not present

## 2021-03-06 DIAGNOSIS — E1140 Type 2 diabetes mellitus with diabetic neuropathy, unspecified: Secondary | ICD-10-CM | POA: Diagnosis not present

## 2021-03-06 DIAGNOSIS — E1122 Type 2 diabetes mellitus with diabetic chronic kidney disease: Secondary | ICD-10-CM | POA: Diagnosis not present

## 2021-03-06 DIAGNOSIS — I4891 Unspecified atrial fibrillation: Secondary | ICD-10-CM | POA: Diagnosis not present

## 2021-03-06 DIAGNOSIS — N183 Chronic kidney disease, stage 3 unspecified: Secondary | ICD-10-CM | POA: Diagnosis not present

## 2021-03-06 NOTE — Telephone Encounter (Signed)
  Patient Consent for Virtual Visit        [name] has provided verbal consent on 03/06/2021 for a virtual visit (video or telephone).   CONSENT FOR VIRTUAL VISIT FOR:  [name]  By participating in this virtual visit I agree to the following:  I hereby voluntarily request, consent and authorize [facility] and its employed or contracted physicians, physician assistants, nurse practitioners or other licensed health care professionals (the Practitioner), to provide me with telemedicine health care services (the "Services") as deemed necessary by the treating Practitioner. I acknowledge and consent to receive the Services by the Practitioner via telemedicine. I understand that the telemedicine visit will involve communicating with the Practitioner through live audiovisual communication technology and the disclosure of certain medical information by electronic transmission. I acknowledge that I have been given the opportunity to request an in-person assessment or other available alternative prior to the telemedicine visit and am voluntarily participating in the telemedicine visit.  I understand that I have the right to withhold or withdraw my consent to the use of telemedicine in the course of my care at any time, without affecting my right to future care or treatment, and that the Practitioner or I may terminate the telemedicine visit at any time. I understand that I have the right to inspect all information obtained and/or recorded in the course of the telemedicine visit and may receive copies of available information for a reasonable fee.  I understand that some of the potential risks of receiving the Services via telemedicine include:  Delay or interruption in medical evaluation due to technological equipment failure or disruption; Information transmitted may not be sufficient (e.g. poor resolution of images) to allow for appropriate medical decision making by the Practitioner; and/or   In rare instances, security protocols could fail, causing a breach of personal health information.  Furthermore, I acknowledge that it is my responsibility to provide information about my medical history, conditions and care that is complete and accurate to the best of my ability. I acknowledge that Practitioner's advice, recommendations, and/or decision may be based on factors not within their control, such as incomplete or inaccurate data provided by me or distortions of diagnostic images or specimens that may result from electronic transmissions. I understand that the practice of medicine is not an exact science and that Practitioner makes no warranties or guarantees regarding treatment outcomes. I acknowledge that a copy of this consent can be made available to me via my patient portal ([place]), or I can request a printed copy by calling the office of [place].    I understand that my insurance will be billed for this visit.   I have read or had this consent read to me. I understand the contents of this consent, which adequately explains the benefits and risks of the Services being provided via telemedicine.  I have been provided ample opportunity to ask questions regarding this consent and the Services and have had my questions answered to my satisfaction. I give my informed consent for the services to be provided through the use of telemedicine in my medical care

## 2021-03-06 NOTE — Progress Notes (Addendum)
Virtual Visit via Telephone Note   This visit type was conducted due to national recommendations for restrictions regarding the COVID-19 Pandemic (e.g. social distancing) in an effort to limit this patient's exposure and mitigate transmission in our community.  Due to her co-morbid illnesses, this patient is at least at moderate risk for complications without adequate follow up.  This format is felt to be most appropriate for this patient at this time.  The patient did not have access to video technology/had technical difficulties with video requiring transitioning to audio format only (telephone).  All issues noted in this document were discussed and addressed.  No physical exam could be performed with this format.  Please refer to the patient's chart for her  consent to telehealth for [facility].    Date:  03/07/2021   ID:  [name], DOB 12/08/1934, MRN [id number] The patient was identified using 2 identifiers.  Patient Location: Home Provider Location: Office/Clinic   PCP:  [name], [place] Providers Cardiologist:  Minus Breeding, MD     Evaluation Performed:  Follow-Up Visit  Chief Complaint: Hospital follow-up  History of Present Illness:    [name] is a 86 y.o. female with a history of chronic diastolic heart failure, atrial fibrillation, CKD,   Last encounter on 01/12/2020 with Dr. Percival Spanish for follow-up related to chronic diastolic heart failure.  She recently had a hospital admission for pancreatitis. Had volume overload with diastolic heart failure.  Had acute urinary retention treated with Flomax.  She was sent to rehab center.  Weight was noted to be up 20 pounds from previous measurement.  She had some increased swelling.  Dr. Percival Spanish increased her torsemide to 40 mg twice daily for 5 days.  She was then to revert back to 20 mg twice daily.  BMP to be performed in 1 week.  She was to follow-up in 10 days for further management and review  volume status.  Need to watch renal function closely.   She was having paroxysms of atrial fibrillation.  Upon follow-up she was to have a 4-week event monitor placed.  Decision then could be made whether she needed chronic anticoagulation.  She was on lower dose anticoagulation due to creatinine greater than 1.5.  However, the creatinine had decreased by the time of discharge. Her BP was controlled. CKD at discharge Crt 1.3.  Follow-up BMP was to be done in 1 week.   She had a recent admission on 02/24/2021 for acute CHF.  She presented via EMS with worsening respiratory distress of the prior 2 days.  She is admitted with acute hypoxic ischemic respiratory failure secondary to acute on chronic diastolic CHF exacerbation.  She was given IV Lasix and nitroglycerin drip.  Cardiology was consulted.  Her weight was near 223 pounds.  She transitioned to oral Lasix 60 mg twice a day and eventually transitioned to torsemide 40 mg daily (20 mg p.o. twice daily).  She was continuing Coreg 12.5 mg p.o. twice daily, hydralazine 100 mg p.o. 3 times daily, amlodipine 10 mg daily.  Her hemoglobin A1c was 8.8%.  She was maintained on sliding scale insulin.  Levemir dose was increased to 27 units subcu twice daily.  Her creatinine was 2.01.  She was to avoid nephrotoxins.  Her discharge medications included; amlodipine 10 mg daily, carvedilol 12.5 mg p.o. twice daily, hydralazine 100 mg p.o. 3 times daily, potassium chloride 10 mEq daily, simvastatin 20 mg daily, torsemide 20 mg p.o.  twice daily.   Spoke with the patient and her daughter today via telemedicine visit.  Daughter states the patient is doing much better.  Daughter states the patient is unable to weigh due to mobility issues but states her breathing is much better, lower extremity edema has improved.  They do have a weight from last Friday of 214 pounds.  Per notes it appears she weighed 223 pounds at discharge and baseline weight is 200.  Daughter states she has  been having a fair amount of cramping since starting the torsemide.  He states other than this issue she has no problems at the moment.  Her blood pressure is well controlled today at 129/58.  Her heart rate is 53.  We discussed the usual requirements to monitor for fluid overload including monitoring weights daily and calling for weights gain greater than 3 pounds in 24 hours or 5 pounds in a week.  Daughter understands.  Discussed with daughter need for follow-up to check metabolic panel especially in light of new issues with cramping.  She states she would not be able to get her mother to [facility] in [place] until Tuesday when she can have the lab work drawn.  She states when her mother was having significant cramping she called EMS who informed her it was likely an electrolyte issue due to being on diuretics..  The patient does not have symptoms concerning for COVID-19 infection (fever, chills, cough, or new shortness of breath).    Past Medical History:  Diagnosis Date   Anemia    Arthritis    Chronic diastolic heart failure (HCC)    CKD (chronic kidney disease) stage 4, GFR 15-29 ml/min (HCC)    Essential hypertension    GERD (gastroesophageal reflux disease)    Hemorrhoids    History of stroke    Intraparenchymal hemorrhage of brain (HCC)    PAF (paroxysmal atrial fibrillation) (HCC)    Type 2 diabetes mellitus ([place])    Past Surgical History:  Procedure Laterality Date   BACK SURGERY     CATARACT EXTRACTION W/PHACO Left 02/28/2013   Procedure: CATARACT EXTRACTION PHACO AND INTRAOCULAR LENS PLACEMENT (IOL) CDE=15.60;  Surgeon: Elta Guadeloupe T. Gershon Crane, MD;  Location: [facility] ORS;  Service: Ophthalmology;  Laterality: Left;   CHOLECYSTECTOMY     COLONOSCOPY  12/12/2003   RMR: Normal rectum.  Long capacious tortuous colon, colonic mucosa appeared normal   COLONOSCOPY N/A 03/21/2014   Dr. [name]: internal and external hemorrhoids, torturous colon, colonic diverticulosis    ESOPHAGOGASTRODUODENOSCOPY   12/28/2001   [id number] sliding hiatal hernia/. Three small bulbar ulcers, two with stigmata of bleeding and these were coagulated using heater probe unit    ESOPHAGOGASTRODUODENOSCOPY N/A 03/21/2014   Dr. [name]: cervical esophageal web s/p dilation, negative H.pylori    EYE SURGERY     HIP FRACTURE SURGERY  2008   JOINT REPLACEMENT     Rt hip, ????? sounds like just for fracture   MALONEY DILATION N/A 03/21/2014   Procedure: [name] DILATION;  Surgeon: [name], MD;  Location: [facility] ENDO SUITE;  Service: Endoscopy;  Laterality: N/A;   SAVORY DILATION N/A 03/21/2014   Procedure: SAVORY DILATION;  Surgeon: [name], MD;  Location: [facility] END[facility] SUITE;  Service: Endoscopy;  Laterality: N/A;     Current Meds  Medication Sig   acetaminophen (TYLENOL) 325 MG tablet Take 2 tablets (650 mg total) by mouth every 6 (six) hours as needed for mild pain, fever or headache (or Fever >/= 101).  amLODipine (NORVASC) 10 MG tablet Take 1 tablet (10 mg total) by mouth daily.   carvedilol (COREG) 12.5 MG tablet Take 1 tablet (12.5 mg total) by mouth 2 (two) times daily with a meal.   DULoxetine (CYMBALTA) 30 MG capsule Take 1 capsule (30 mg total) by mouth daily.   gabapentin (NEURONTIN) 300 MG capsule Take 300 mg by mouth 3 (three) times daily.   hydrALAZINE (APRESOLINE) 100 MG tablet Take 1 tablet (100 mg total) by mouth 3 (three) times daily.   hydrocortisone (ANUSOL-HC) 2.5 % rectal cream Place 1 application rectally 2 (two) times daily as needed for hemorrhoids or anal itching.   insulin aspart (NOVOLOG) 100 UNIT/ML FlexPen Inject 0-10 Units into the skin 3 (three) times daily with meals. insulin aspart (novoLOG) injection 0-10 Units 0-10 Units Subcutaneous, 3 times daily with meals CBG < 70: Implement Hypoglycemia Standing Orders and refer to Hypoglycemia Standing Orders sidebar report  CBG 70 - 120: 0 unit CBG 121 - 150: 0 unit  CBG 151 - 200: 1 unit CBG 201 - 250: 2 units CBG 251 - 300: 4 units CBG 301  - 350: 6 units  CBG 351 - 400: 8 units  CBG > 400: 10 units   insulin detemir (LEVEMIR) 100 UNIT/ML injection Inject 0.22 mLs (22 Units total) into the skin 2 (two) times daily. (Patient taking differently: Inject 30 Units into the skin at bedtime.)   pantoprazole (PROTONIX) 40 MG tablet Take 1 tablet by mouth daily.   potassium chloride (KLOR-CON) 10 MEQ tablet Take 1 tablet (10 mEq total) by mouth 2 (two) times daily.   simvastatin (ZOCOR) 20 MG tablet Take 20 mg by mouth at bedtime.   torsemide (DEMADEX) 20 MG tablet Take 1 tablet (20 mg total) by mouth 2 (two) times daily.     Allergies:   Patient has no known allergies.   Social History   Tobacco Use   Smoking status: Never   Smokeless tobacco: Never  Vaping Use   Vaping Use: Never used  Substance Use Topics   Alcohol use: No   Drug use: No     Family Hx: The patient's family history includes Crohn's disease in her daughter; Diabetes in her brother, sister, and sister; Heart attack in her sister; Hypertension in her mother and sister. There is no history of Colon cancer.  ROS:   Please see the history of present illness.    All other systems reviewed and are negative.   Prior CV studies:   The following studies were reviewed today:   Echocardiogram 02/24/2021 Left ventricular ejection fraction, by estimation, is 60 to 65%. The left ventricle has normal function. The left ventricle has no regional wall motion abnormalities. There is moderate left ventricular hypertrophy. Right ventricular systolic function is normal. The right ventricular size is mildly enlarged.   NST  07/18/2019 Study Highlights   Nuclear stress EF: 55%. The left ventricular ejection fraction is normal (55-65%). There was no ST segment deviation noted during stress. There is a medium defect of moderate severity present in the basal anterior, basal anteroseptal, mid anterior and mid anteroseptal location. The defect is non-reversible and consistent  with breast attenuation artifact. This is a low risk study.     Echocardiogram 06/29/2019  1. Left ventricular ejection fraction, by estimation, is 60 to 65%. The left ventricle has normal function. The left ventrical has no regional wall motion abnormalities. There is moderately increased left ventricular hypertrophy. Left ventricular diastolic parameters are consistent with  Grade II diastolic dysfunction (pseudonormalization). Elevated left atrial pressure. 2. Right ventricular systolic function is normal. The right ventricular size is normal. 3. Left atrial size was severely dilated. 4. The mitral valve is normal in structure and function. trivial mitral valve regurgitation. No evidence of mitral stenosis. 5. The aortic valve is tricuspid. Aortic valve regurgitation is not visualized. No aortic stenosis is present. 6. The inferior vena cava is normal in size with greater than 50% respiratory variability, suggesting right atrial pressure of 3 mmHg    Labs/Other Tests and Data Reviewed:    EKG:  EKG 02/24/2021 sinus tachycardia rate of 108, left bundle branch block.  Recent Labs: 10/11/2020: ALT 23 02/24/2021: B Natriuretic Peptide 150.0; TSH 3.675 02/26/2021: Hemoglobin 9.0; Magnesium 2.0; Platelets 221 02/28/2021: BUN 40; Creatinine, Ser 1.58; Potassium 3.6; Sodium 137   Recent Lipid Panel Lab Results  Component Value Date/Time   CHOL 129 09/26/2020 03:23 AM   TRIG 133 09/26/2020 03:23 AM   HDL 29 (L) 09/26/2020 03:23 AM   CHOLHDL 4.4 09/26/2020 03:23 AM   LDLCALC 73 09/26/2020 03:23 AM    Wt Readings from Last 3 Encounters:  03/07/21 214 lb (97.1 kg)  02/28/21 216 lb 11.4 oz (98.3 kg)  12/17/20 200 lb (90.7 kg)     Risk Assessment/Calculations:    CHA2DS2-VASc Score = 8   This indicates a 10.8% annual risk of stroke. The patient's score is based upon: CHF History: 1 HTN History: 1 Diabetes History: 1 Stroke History: 2 Vascular Disease History: 0 Age Score:  2 Gender Score: 1         Objective:    Vital Signs:  BP (!) 129/58   Pulse (!) 53   Temp (!) 97.1 F (36.2 C)   Ht '5\' 4"'$  (1.626 m)   Wt 214 lb (97.1 kg) Comment: as of last Friday  SpO2 96%   BMI 36.73 kg/m    Spoke to patient's daughter mostly..  Patient was in the background.  Daughter states she was in no acute distress.  Lower extremity edema was better per daughter  ASSESSMENT & PLAN:     1. Chronic diastolic heart failure ([place]) Recent admission for chronic diastolic heart failure.  She was diuresed and had net negative of 4.7 L.  Her weight was 223.  At discharge her weight was 214 on last Friday.  Daughter states mother is unable to weigh due to mobility issues.  She states her lower extremity edema looks much better and she has no DOE or SOB.  She states her mother has been cramping a lot at home and she called EMS who stated it was likely an issue with electrolytes due to diuretic therapy.  We are increasing potassium to 40 mEq a day for next 5 days.  Then she is to revert to 20 mEq/day.  Delete continue torsemide 20 mg p.o. twice daily.  We are getting a follow-up basic metabolic panel.  Daughter states she will not be able to get her mother to [facility] in [place] until Tuesday to have labs drawn.  Continue carvedilol 12.5 mg p.o. twice daily.  2. Paroxysmal atrial fibrillation (HCC) Her EKG on 02/24/2021 was sinus tachycardia with a rate of 108 with LBBB.  She denies any palpitations or arrhythmias.  Heart rate today is 53.  3. Stage 3 chronic kidney disease, unspecified whether stage 3a or 3b CKD ([place]) Lab work on 02/28/2021 with creatinine 1.58 and GFR 32.  We are getting a repeat basic metabolic  panel to reassess electrolytes and renal function.  Daughter states she will not be able to get her mother to [facility] in [place] until Tuesday but will have it done at that time  4. Essential hypertension Blood pressure today is 129/58.  No need to adjust medications at the  moment. Continue hydralazine 100 mg p.o. 3 times daily, continue amlodipine 10 mg daily      COVID-19 Education: The signs and symptoms of COVID-19 were discussed with the patient and how to seek care for testing (follow up with PCP or arrange E-visit).  The importance of social distancing was discussed today.  Time:   Today, I have spent 8 minutes with the patient with telehealth technology discussing the above problems.     Medication Adjustments/Labs and Tests Ordered: Current medicines are reviewed at length with the patient today.  Concerns regarding medicines are outlined above.   Tests Ordered: No orders of the defined types were placed in this encounter.   Medication Changes: No orders of the defined types were placed in this encounter.   Follow Up:  In Person in 6 month(s)  Signed, Verta Ellen, NP  03/07/2021 1:42 PM    [facility]

## 2021-03-07 ENCOUNTER — Ambulatory Visit (INDEPENDENT_AMBULATORY_CARE_PROVIDER_SITE_OTHER): Payer: Medicare HMO | Admitting: Family Medicine

## 2021-03-07 ENCOUNTER — Encounter: Payer: Self-pay | Admitting: Family Medicine

## 2021-03-07 ENCOUNTER — Other Ambulatory Visit: Payer: Self-pay

## 2021-03-07 VITALS — BP 129/58 | HR 53 | Temp 97.1°F | Ht 64.0 in | Wt 214.0 lb

## 2021-03-07 DIAGNOSIS — N183 Chronic kidney disease, stage 3 unspecified: Secondary | ICD-10-CM

## 2021-03-07 DIAGNOSIS — I10 Essential (primary) hypertension: Secondary | ICD-10-CM

## 2021-03-07 DIAGNOSIS — I5032 Chronic diastolic (congestive) heart failure: Secondary | ICD-10-CM | POA: Diagnosis not present

## 2021-03-07 DIAGNOSIS — I480 Paroxysmal atrial fibrillation: Secondary | ICD-10-CM

## 2021-03-07 MED ORDER — POTASSIUM CHLORIDE CRYS ER 20 MEQ PO TBCR: 40.0000 meq | EXTENDED_RELEASE_TABLET | Freq: Every day | ORAL | 0 refills | Status: DC

## 2021-03-07 NOTE — Patient Instructions (Signed)
Medication Instructions:  Increase Potassium to 9mq x 5 days only, then back to previous dose of 245m daily.  Continue all other medications.     Labwork: BMET, Mg  [facility]  Office will contact with results via phone or letter.     Testing/Procedures: none  Follow-Up: Your physician wants you to follow up in: 6 months.  You will receive a reminder letter in the mail one-two months in advance.  If you don't receive a letter, please call our office to schedule the follow up appointment   Any Other Special Instructions Will Be Listed Below (If Applicable).   If you need a refill on your cardiac medications before your next appointment, please call your pharmacy.

## 2021-03-07 NOTE — Addendum Note (Signed)
Addended by: Laurine Blazer on: 03/07/2021 06:48 PM   Modules accepted: Orders

## 2021-03-11 DIAGNOSIS — I4891 Unspecified atrial fibrillation: Secondary | ICD-10-CM | POA: Diagnosis not present

## 2021-03-11 DIAGNOSIS — I130 Hypertensive heart and chronic kidney disease with heart failure and stage 1 through stage 4 chronic kidney disease, or unspecified chronic kidney disease: Secondary | ICD-10-CM | POA: Diagnosis not present

## 2021-03-11 DIAGNOSIS — E1122 Type 2 diabetes mellitus with diabetic chronic kidney disease: Secondary | ICD-10-CM | POA: Diagnosis not present

## 2021-03-11 DIAGNOSIS — D631 Anemia in chronic kidney disease: Secondary | ICD-10-CM | POA: Diagnosis not present

## 2021-03-11 DIAGNOSIS — G8929 Other chronic pain: Secondary | ICD-10-CM | POA: Diagnosis not present

## 2021-03-11 DIAGNOSIS — N183 Chronic kidney disease, stage 3 unspecified: Secondary | ICD-10-CM | POA: Diagnosis not present

## 2021-03-11 DIAGNOSIS — E1140 Type 2 diabetes mellitus with diabetic neuropathy, unspecified: Secondary | ICD-10-CM | POA: Diagnosis not present

## 2021-03-11 DIAGNOSIS — R200 Anesthesia of skin: Secondary | ICD-10-CM | POA: Diagnosis not present

## 2021-03-11 DIAGNOSIS — I5032 Chronic diastolic (congestive) heart failure: Secondary | ICD-10-CM | POA: Diagnosis not present

## 2021-03-13 ENCOUNTER — Ambulatory Visit: Payer: Medicare HMO | Admitting: Orthopaedic Surgery

## 2021-03-13 DIAGNOSIS — I4891 Unspecified atrial fibrillation: Secondary | ICD-10-CM | POA: Diagnosis not present

## 2021-03-13 DIAGNOSIS — I130 Hypertensive heart and chronic kidney disease with heart failure and stage 1 through stage 4 chronic kidney disease, or unspecified chronic kidney disease: Secondary | ICD-10-CM | POA: Diagnosis not present

## 2021-03-13 DIAGNOSIS — I5032 Chronic diastolic (congestive) heart failure: Secondary | ICD-10-CM | POA: Diagnosis not present

## 2021-03-13 DIAGNOSIS — E1140 Type 2 diabetes mellitus with diabetic neuropathy, unspecified: Secondary | ICD-10-CM | POA: Diagnosis not present

## 2021-03-13 DIAGNOSIS — D631 Anemia in chronic kidney disease: Secondary | ICD-10-CM | POA: Diagnosis not present

## 2021-03-13 DIAGNOSIS — N183 Chronic kidney disease, stage 3 unspecified: Secondary | ICD-10-CM | POA: Diagnosis not present

## 2021-03-13 DIAGNOSIS — R200 Anesthesia of skin: Secondary | ICD-10-CM | POA: Diagnosis not present

## 2021-03-13 DIAGNOSIS — E1122 Type 2 diabetes mellitus with diabetic chronic kidney disease: Secondary | ICD-10-CM | POA: Diagnosis not present

## 2021-03-13 DIAGNOSIS — G8929 Other chronic pain: Secondary | ICD-10-CM | POA: Diagnosis not present

## 2021-03-18 DIAGNOSIS — N183 Chronic kidney disease, stage 3 unspecified: Secondary | ICD-10-CM | POA: Diagnosis not present

## 2021-03-18 DIAGNOSIS — N1832 Chronic kidney disease, stage 3b: Secondary | ICD-10-CM | POA: Diagnosis not present

## 2021-03-18 DIAGNOSIS — E1140 Type 2 diabetes mellitus with diabetic neuropathy, unspecified: Secondary | ICD-10-CM | POA: Diagnosis not present

## 2021-03-18 DIAGNOSIS — D631 Anemia in chronic kidney disease: Secondary | ICD-10-CM | POA: Diagnosis not present

## 2021-03-18 DIAGNOSIS — I609 Nontraumatic subarachnoid hemorrhage, unspecified: Secondary | ICD-10-CM | POA: Diagnosis not present

## 2021-03-18 DIAGNOSIS — R200 Anesthesia of skin: Secondary | ICD-10-CM | POA: Diagnosis not present

## 2021-03-18 DIAGNOSIS — I5032 Chronic diastolic (congestive) heart failure: Secondary | ICD-10-CM | POA: Diagnosis not present

## 2021-03-18 DIAGNOSIS — G8929 Other chronic pain: Secondary | ICD-10-CM | POA: Diagnosis not present

## 2021-03-18 DIAGNOSIS — I4891 Unspecified atrial fibrillation: Secondary | ICD-10-CM | POA: Diagnosis not present

## 2021-03-18 DIAGNOSIS — I130 Hypertensive heart and chronic kidney disease with heart failure and stage 1 through stage 4 chronic kidney disease, or unspecified chronic kidney disease: Secondary | ICD-10-CM | POA: Diagnosis not present

## 2021-03-18 DIAGNOSIS — I69351 Hemiplegia and hemiparesis following cerebral infarction affecting right dominant side: Secondary | ICD-10-CM | POA: Diagnosis not present

## 2021-03-18 DIAGNOSIS — I618 Other nontraumatic intracerebral hemorrhage: Secondary | ICD-10-CM | POA: Diagnosis not present

## 2021-03-18 DIAGNOSIS — I639 Cerebral infarction, unspecified: Secondary | ICD-10-CM | POA: Diagnosis not present

## 2021-03-18 DIAGNOSIS — E1122 Type 2 diabetes mellitus with diabetic chronic kidney disease: Secondary | ICD-10-CM | POA: Diagnosis not present

## 2021-03-18 DIAGNOSIS — Z515 Encounter for palliative care: Secondary | ICD-10-CM | POA: Diagnosis not present

## 2021-03-20 DIAGNOSIS — I5032 Chronic diastolic (congestive) heart failure: Secondary | ICD-10-CM | POA: Diagnosis not present

## 2021-03-20 DIAGNOSIS — D631 Anemia in chronic kidney disease: Secondary | ICD-10-CM | POA: Diagnosis not present

## 2021-03-20 DIAGNOSIS — N183 Chronic kidney disease, stage 3 unspecified: Secondary | ICD-10-CM | POA: Diagnosis not present

## 2021-03-20 DIAGNOSIS — E1122 Type 2 diabetes mellitus with diabetic chronic kidney disease: Secondary | ICD-10-CM | POA: Diagnosis not present

## 2021-03-20 DIAGNOSIS — E1140 Type 2 diabetes mellitus with diabetic neuropathy, unspecified: Secondary | ICD-10-CM | POA: Diagnosis not present

## 2021-03-20 DIAGNOSIS — I4891 Unspecified atrial fibrillation: Secondary | ICD-10-CM | POA: Diagnosis not present

## 2021-03-20 DIAGNOSIS — R200 Anesthesia of skin: Secondary | ICD-10-CM | POA: Diagnosis not present

## 2021-03-20 DIAGNOSIS — I130 Hypertensive heart and chronic kidney disease with heart failure and stage 1 through stage 4 chronic kidney disease, or unspecified chronic kidney disease: Secondary | ICD-10-CM | POA: Diagnosis not present

## 2021-03-20 DIAGNOSIS — G8929 Other chronic pain: Secondary | ICD-10-CM | POA: Diagnosis not present

## 2021-03-23 DIAGNOSIS — F32A Depression, unspecified: Secondary | ICD-10-CM | POA: Diagnosis not present

## 2021-03-23 DIAGNOSIS — I130 Hypertensive heart and chronic kidney disease with heart failure and stage 1 through stage 4 chronic kidney disease, or unspecified chronic kidney disease: Secondary | ICD-10-CM | POA: Diagnosis not present

## 2021-03-23 DIAGNOSIS — N1831 Chronic kidney disease, stage 3a: Secondary | ICD-10-CM | POA: Diagnosis not present

## 2021-03-23 DIAGNOSIS — I5033 Acute on chronic diastolic (congestive) heart failure: Secondary | ICD-10-CM | POA: Diagnosis not present

## 2021-03-23 DIAGNOSIS — E1122 Type 2 diabetes mellitus with diabetic chronic kidney disease: Secondary | ICD-10-CM | POA: Diagnosis not present

## 2021-03-23 DIAGNOSIS — D631 Anemia in chronic kidney disease: Secondary | ICD-10-CM | POA: Diagnosis not present

## 2021-03-23 DIAGNOSIS — G8929 Other chronic pain: Secondary | ICD-10-CM | POA: Diagnosis not present

## 2021-03-23 DIAGNOSIS — I4891 Unspecified atrial fibrillation: Secondary | ICD-10-CM | POA: Diagnosis not present

## 2021-03-23 DIAGNOSIS — E1140 Type 2 diabetes mellitus with diabetic neuropathy, unspecified: Secondary | ICD-10-CM | POA: Diagnosis not present

## 2021-03-26 DIAGNOSIS — D631 Anemia in chronic kidney disease: Secondary | ICD-10-CM | POA: Diagnosis not present

## 2021-03-26 DIAGNOSIS — E1122 Type 2 diabetes mellitus with diabetic chronic kidney disease: Secondary | ICD-10-CM | POA: Diagnosis not present

## 2021-03-26 DIAGNOSIS — F32A Depression, unspecified: Secondary | ICD-10-CM | POA: Diagnosis not present

## 2021-03-26 DIAGNOSIS — I130 Hypertensive heart and chronic kidney disease with heart failure and stage 1 through stage 4 chronic kidney disease, or unspecified chronic kidney disease: Secondary | ICD-10-CM | POA: Diagnosis not present

## 2021-03-26 DIAGNOSIS — I5033 Acute on chronic diastolic (congestive) heart failure: Secondary | ICD-10-CM | POA: Diagnosis not present

## 2021-03-26 DIAGNOSIS — G8929 Other chronic pain: Secondary | ICD-10-CM | POA: Diagnosis not present

## 2021-03-26 DIAGNOSIS — I4891 Unspecified atrial fibrillation: Secondary | ICD-10-CM | POA: Diagnosis not present

## 2021-03-26 DIAGNOSIS — E1140 Type 2 diabetes mellitus with diabetic neuropathy, unspecified: Secondary | ICD-10-CM | POA: Diagnosis not present

## 2021-03-26 DIAGNOSIS — N1831 Chronic kidney disease, stage 3a: Secondary | ICD-10-CM | POA: Diagnosis not present

## 2021-03-27 DIAGNOSIS — E1140 Type 2 diabetes mellitus with diabetic neuropathy, unspecified: Secondary | ICD-10-CM | POA: Diagnosis not present

## 2021-03-27 DIAGNOSIS — E1122 Type 2 diabetes mellitus with diabetic chronic kidney disease: Secondary | ICD-10-CM | POA: Diagnosis not present

## 2021-03-27 DIAGNOSIS — F32A Depression, unspecified: Secondary | ICD-10-CM | POA: Diagnosis not present

## 2021-03-27 DIAGNOSIS — G8929 Other chronic pain: Secondary | ICD-10-CM | POA: Diagnosis not present

## 2021-03-27 DIAGNOSIS — I5033 Acute on chronic diastolic (congestive) heart failure: Secondary | ICD-10-CM | POA: Diagnosis not present

## 2021-03-27 DIAGNOSIS — D631 Anemia in chronic kidney disease: Secondary | ICD-10-CM | POA: Diagnosis not present

## 2021-03-27 DIAGNOSIS — I130 Hypertensive heart and chronic kidney disease with heart failure and stage 1 through stage 4 chronic kidney disease, or unspecified chronic kidney disease: Secondary | ICD-10-CM | POA: Diagnosis not present

## 2021-03-27 DIAGNOSIS — N1831 Chronic kidney disease, stage 3a: Secondary | ICD-10-CM | POA: Diagnosis not present

## 2021-03-27 DIAGNOSIS — I4891 Unspecified atrial fibrillation: Secondary | ICD-10-CM | POA: Diagnosis not present

## 2021-03-28 DIAGNOSIS — I5033 Acute on chronic diastolic (congestive) heart failure: Secondary | ICD-10-CM | POA: Diagnosis not present

## 2021-03-28 DIAGNOSIS — N1831 Chronic kidney disease, stage 3a: Secondary | ICD-10-CM | POA: Diagnosis not present

## 2021-03-28 DIAGNOSIS — E1122 Type 2 diabetes mellitus with diabetic chronic kidney disease: Secondary | ICD-10-CM | POA: Diagnosis not present

## 2021-03-28 DIAGNOSIS — G8929 Other chronic pain: Secondary | ICD-10-CM | POA: Diagnosis not present

## 2021-03-28 DIAGNOSIS — I4891 Unspecified atrial fibrillation: Secondary | ICD-10-CM | POA: Diagnosis not present

## 2021-03-28 DIAGNOSIS — E1140 Type 2 diabetes mellitus with diabetic neuropathy, unspecified: Secondary | ICD-10-CM | POA: Diagnosis not present

## 2021-03-28 DIAGNOSIS — D631 Anemia in chronic kidney disease: Secondary | ICD-10-CM | POA: Diagnosis not present

## 2021-03-28 DIAGNOSIS — I130 Hypertensive heart and chronic kidney disease with heart failure and stage 1 through stage 4 chronic kidney disease, or unspecified chronic kidney disease: Secondary | ICD-10-CM | POA: Diagnosis not present

## 2021-03-28 DIAGNOSIS — F32A Depression, unspecified: Secondary | ICD-10-CM | POA: Diagnosis not present

## 2021-03-31 DIAGNOSIS — I5033 Acute on chronic diastolic (congestive) heart failure: Secondary | ICD-10-CM | POA: Diagnosis not present

## 2021-03-31 DIAGNOSIS — I4891 Unspecified atrial fibrillation: Secondary | ICD-10-CM | POA: Diagnosis not present

## 2021-03-31 DIAGNOSIS — E1122 Type 2 diabetes mellitus with diabetic chronic kidney disease: Secondary | ICD-10-CM | POA: Diagnosis not present

## 2021-03-31 DIAGNOSIS — E1140 Type 2 diabetes mellitus with diabetic neuropathy, unspecified: Secondary | ICD-10-CM | POA: Diagnosis not present

## 2021-03-31 DIAGNOSIS — N1831 Chronic kidney disease, stage 3a: Secondary | ICD-10-CM | POA: Diagnosis not present

## 2021-03-31 DIAGNOSIS — F32A Depression, unspecified: Secondary | ICD-10-CM | POA: Diagnosis not present

## 2021-03-31 DIAGNOSIS — G8929 Other chronic pain: Secondary | ICD-10-CM | POA: Diagnosis not present

## 2021-03-31 DIAGNOSIS — I130 Hypertensive heart and chronic kidney disease with heart failure and stage 1 through stage 4 chronic kidney disease, or unspecified chronic kidney disease: Secondary | ICD-10-CM | POA: Diagnosis not present

## 2021-03-31 DIAGNOSIS — D631 Anemia in chronic kidney disease: Secondary | ICD-10-CM | POA: Diagnosis not present

## 2021-04-01 DIAGNOSIS — E1140 Type 2 diabetes mellitus with diabetic neuropathy, unspecified: Secondary | ICD-10-CM | POA: Diagnosis not present

## 2021-04-01 DIAGNOSIS — I130 Hypertensive heart and chronic kidney disease with heart failure and stage 1 through stage 4 chronic kidney disease, or unspecified chronic kidney disease: Secondary | ICD-10-CM | POA: Diagnosis not present

## 2021-04-01 DIAGNOSIS — E1122 Type 2 diabetes mellitus with diabetic chronic kidney disease: Secondary | ICD-10-CM | POA: Diagnosis not present

## 2021-04-01 DIAGNOSIS — N1831 Chronic kidney disease, stage 3a: Secondary | ICD-10-CM | POA: Diagnosis not present

## 2021-04-01 DIAGNOSIS — D631 Anemia in chronic kidney disease: Secondary | ICD-10-CM | POA: Diagnosis not present

## 2021-04-01 DIAGNOSIS — G8929 Other chronic pain: Secondary | ICD-10-CM | POA: Diagnosis not present

## 2021-04-01 DIAGNOSIS — I4891 Unspecified atrial fibrillation: Secondary | ICD-10-CM | POA: Diagnosis not present

## 2021-04-01 DIAGNOSIS — I5033 Acute on chronic diastolic (congestive) heart failure: Secondary | ICD-10-CM | POA: Diagnosis not present

## 2021-04-01 DIAGNOSIS — F32A Depression, unspecified: Secondary | ICD-10-CM | POA: Diagnosis not present

## 2021-04-02 DIAGNOSIS — E1140 Type 2 diabetes mellitus with diabetic neuropathy, unspecified: Secondary | ICD-10-CM | POA: Diagnosis not present

## 2021-04-02 DIAGNOSIS — F32A Depression, unspecified: Secondary | ICD-10-CM | POA: Diagnosis not present

## 2021-04-02 DIAGNOSIS — I5033 Acute on chronic diastolic (congestive) heart failure: Secondary | ICD-10-CM | POA: Diagnosis not present

## 2021-04-02 DIAGNOSIS — E1122 Type 2 diabetes mellitus with diabetic chronic kidney disease: Secondary | ICD-10-CM | POA: Diagnosis not present

## 2021-04-02 DIAGNOSIS — I130 Hypertensive heart and chronic kidney disease with heart failure and stage 1 through stage 4 chronic kidney disease, or unspecified chronic kidney disease: Secondary | ICD-10-CM | POA: Diagnosis not present

## 2021-04-02 DIAGNOSIS — D631 Anemia in chronic kidney disease: Secondary | ICD-10-CM | POA: Diagnosis not present

## 2021-04-02 DIAGNOSIS — I4891 Unspecified atrial fibrillation: Secondary | ICD-10-CM | POA: Diagnosis not present

## 2021-04-02 DIAGNOSIS — N1831 Chronic kidney disease, stage 3a: Secondary | ICD-10-CM | POA: Diagnosis not present

## 2021-04-02 DIAGNOSIS — G8929 Other chronic pain: Secondary | ICD-10-CM | POA: Diagnosis not present

## 2021-04-07 DIAGNOSIS — N1831 Chronic kidney disease, stage 3a: Secondary | ICD-10-CM | POA: Diagnosis not present

## 2021-04-07 DIAGNOSIS — E1122 Type 2 diabetes mellitus with diabetic chronic kidney disease: Secondary | ICD-10-CM | POA: Diagnosis not present

## 2021-04-07 DIAGNOSIS — I5033 Acute on chronic diastolic (congestive) heart failure: Secondary | ICD-10-CM | POA: Diagnosis not present

## 2021-04-07 DIAGNOSIS — F32A Depression, unspecified: Secondary | ICD-10-CM | POA: Diagnosis not present

## 2021-04-07 DIAGNOSIS — I4891 Unspecified atrial fibrillation: Secondary | ICD-10-CM | POA: Diagnosis not present

## 2021-04-07 DIAGNOSIS — I130 Hypertensive heart and chronic kidney disease with heart failure and stage 1 through stage 4 chronic kidney disease, or unspecified chronic kidney disease: Secondary | ICD-10-CM | POA: Diagnosis not present

## 2021-04-07 DIAGNOSIS — E1140 Type 2 diabetes mellitus with diabetic neuropathy, unspecified: Secondary | ICD-10-CM | POA: Diagnosis not present

## 2021-04-07 DIAGNOSIS — G8929 Other chronic pain: Secondary | ICD-10-CM | POA: Diagnosis not present

## 2021-04-07 DIAGNOSIS — D631 Anemia in chronic kidney disease: Secondary | ICD-10-CM | POA: Diagnosis not present

## 2021-04-08 DIAGNOSIS — I5033 Acute on chronic diastolic (congestive) heart failure: Secondary | ICD-10-CM | POA: Diagnosis not present

## 2021-04-08 DIAGNOSIS — E1122 Type 2 diabetes mellitus with diabetic chronic kidney disease: Secondary | ICD-10-CM | POA: Diagnosis not present

## 2021-04-08 DIAGNOSIS — G8929 Other chronic pain: Secondary | ICD-10-CM | POA: Diagnosis not present

## 2021-04-08 DIAGNOSIS — I130 Hypertensive heart and chronic kidney disease with heart failure and stage 1 through stage 4 chronic kidney disease, or unspecified chronic kidney disease: Secondary | ICD-10-CM | POA: Diagnosis not present

## 2021-04-08 DIAGNOSIS — N1831 Chronic kidney disease, stage 3a: Secondary | ICD-10-CM | POA: Diagnosis not present

## 2021-04-08 DIAGNOSIS — I4891 Unspecified atrial fibrillation: Secondary | ICD-10-CM | POA: Diagnosis not present

## 2021-04-08 DIAGNOSIS — F32A Depression, unspecified: Secondary | ICD-10-CM | POA: Diagnosis not present

## 2021-04-08 DIAGNOSIS — D631 Anemia in chronic kidney disease: Secondary | ICD-10-CM | POA: Diagnosis not present

## 2021-04-08 DIAGNOSIS — E1140 Type 2 diabetes mellitus with diabetic neuropathy, unspecified: Secondary | ICD-10-CM | POA: Diagnosis not present

## 2021-04-09 DIAGNOSIS — D631 Anemia in chronic kidney disease: Secondary | ICD-10-CM | POA: Diagnosis not present

## 2021-04-09 DIAGNOSIS — F32A Depression, unspecified: Secondary | ICD-10-CM | POA: Diagnosis not present

## 2021-04-09 DIAGNOSIS — G8929 Other chronic pain: Secondary | ICD-10-CM | POA: Diagnosis not present

## 2021-04-09 DIAGNOSIS — I4891 Unspecified atrial fibrillation: Secondary | ICD-10-CM | POA: Diagnosis not present

## 2021-04-09 DIAGNOSIS — I130 Hypertensive heart and chronic kidney disease with heart failure and stage 1 through stage 4 chronic kidney disease, or unspecified chronic kidney disease: Secondary | ICD-10-CM | POA: Diagnosis not present

## 2021-04-09 DIAGNOSIS — E1122 Type 2 diabetes mellitus with diabetic chronic kidney disease: Secondary | ICD-10-CM | POA: Diagnosis not present

## 2021-04-09 DIAGNOSIS — E1140 Type 2 diabetes mellitus with diabetic neuropathy, unspecified: Secondary | ICD-10-CM | POA: Diagnosis not present

## 2021-04-09 DIAGNOSIS — I5033 Acute on chronic diastolic (congestive) heart failure: Secondary | ICD-10-CM | POA: Diagnosis not present

## 2021-04-09 DIAGNOSIS — N1831 Chronic kidney disease, stage 3a: Secondary | ICD-10-CM | POA: Diagnosis not present

## 2021-04-14 DIAGNOSIS — E1140 Type 2 diabetes mellitus with diabetic neuropathy, unspecified: Secondary | ICD-10-CM | POA: Diagnosis not present

## 2021-04-14 DIAGNOSIS — E1122 Type 2 diabetes mellitus with diabetic chronic kidney disease: Secondary | ICD-10-CM | POA: Diagnosis not present

## 2021-04-14 DIAGNOSIS — F32A Depression, unspecified: Secondary | ICD-10-CM | POA: Diagnosis not present

## 2021-04-14 DIAGNOSIS — D631 Anemia in chronic kidney disease: Secondary | ICD-10-CM | POA: Diagnosis not present

## 2021-04-14 DIAGNOSIS — I130 Hypertensive heart and chronic kidney disease with heart failure and stage 1 through stage 4 chronic kidney disease, or unspecified chronic kidney disease: Secondary | ICD-10-CM | POA: Diagnosis not present

## 2021-04-14 DIAGNOSIS — N1831 Chronic kidney disease, stage 3a: Secondary | ICD-10-CM | POA: Diagnosis not present

## 2021-04-14 DIAGNOSIS — I4891 Unspecified atrial fibrillation: Secondary | ICD-10-CM | POA: Diagnosis not present

## 2021-04-14 DIAGNOSIS — I5033 Acute on chronic diastolic (congestive) heart failure: Secondary | ICD-10-CM | POA: Diagnosis not present

## 2021-04-14 DIAGNOSIS — G8929 Other chronic pain: Secondary | ICD-10-CM | POA: Diagnosis not present

## 2021-04-16 DIAGNOSIS — I5033 Acute on chronic diastolic (congestive) heart failure: Secondary | ICD-10-CM | POA: Diagnosis not present

## 2021-04-16 DIAGNOSIS — N1831 Chronic kidney disease, stage 3a: Secondary | ICD-10-CM | POA: Diagnosis not present

## 2021-04-16 DIAGNOSIS — G8929 Other chronic pain: Secondary | ICD-10-CM | POA: Diagnosis not present

## 2021-04-16 DIAGNOSIS — E1122 Type 2 diabetes mellitus with diabetic chronic kidney disease: Secondary | ICD-10-CM | POA: Diagnosis not present

## 2021-04-16 DIAGNOSIS — E1140 Type 2 diabetes mellitus with diabetic neuropathy, unspecified: Secondary | ICD-10-CM | POA: Diagnosis not present

## 2021-04-16 DIAGNOSIS — F32A Depression, unspecified: Secondary | ICD-10-CM | POA: Diagnosis not present

## 2021-04-16 DIAGNOSIS — D631 Anemia in chronic kidney disease: Secondary | ICD-10-CM | POA: Diagnosis not present

## 2021-04-16 DIAGNOSIS — I130 Hypertensive heart and chronic kidney disease with heart failure and stage 1 through stage 4 chronic kidney disease, or unspecified chronic kidney disease: Secondary | ICD-10-CM | POA: Diagnosis not present

## 2021-04-16 DIAGNOSIS — I4891 Unspecified atrial fibrillation: Secondary | ICD-10-CM | POA: Diagnosis not present

## 2021-04-18 DIAGNOSIS — I4891 Unspecified atrial fibrillation: Secondary | ICD-10-CM | POA: Diagnosis not present

## 2021-04-18 DIAGNOSIS — E1122 Type 2 diabetes mellitus with diabetic chronic kidney disease: Secondary | ICD-10-CM | POA: Diagnosis not present

## 2021-04-18 DIAGNOSIS — F32A Depression, unspecified: Secondary | ICD-10-CM | POA: Diagnosis not present

## 2021-04-18 DIAGNOSIS — G8929 Other chronic pain: Secondary | ICD-10-CM | POA: Diagnosis not present

## 2021-04-18 DIAGNOSIS — I5033 Acute on chronic diastolic (congestive) heart failure: Secondary | ICD-10-CM | POA: Diagnosis not present

## 2021-04-18 DIAGNOSIS — D631 Anemia in chronic kidney disease: Secondary | ICD-10-CM | POA: Diagnosis not present

## 2021-04-18 DIAGNOSIS — I130 Hypertensive heart and chronic kidney disease with heart failure and stage 1 through stage 4 chronic kidney disease, or unspecified chronic kidney disease: Secondary | ICD-10-CM | POA: Diagnosis not present

## 2021-04-18 DIAGNOSIS — E1140 Type 2 diabetes mellitus with diabetic neuropathy, unspecified: Secondary | ICD-10-CM | POA: Diagnosis not present

## 2021-04-18 DIAGNOSIS — N1831 Chronic kidney disease, stage 3a: Secondary | ICD-10-CM | POA: Diagnosis not present

## 2021-04-21 DIAGNOSIS — N1831 Chronic kidney disease, stage 3a: Secondary | ICD-10-CM | POA: Diagnosis not present

## 2021-04-21 DIAGNOSIS — D631 Anemia in chronic kidney disease: Secondary | ICD-10-CM | POA: Diagnosis not present

## 2021-04-21 DIAGNOSIS — I5033 Acute on chronic diastolic (congestive) heart failure: Secondary | ICD-10-CM | POA: Diagnosis not present

## 2021-04-21 DIAGNOSIS — G8929 Other chronic pain: Secondary | ICD-10-CM | POA: Diagnosis not present

## 2021-04-21 DIAGNOSIS — I4891 Unspecified atrial fibrillation: Secondary | ICD-10-CM | POA: Diagnosis not present

## 2021-04-21 DIAGNOSIS — E1140 Type 2 diabetes mellitus with diabetic neuropathy, unspecified: Secondary | ICD-10-CM | POA: Diagnosis not present

## 2021-04-21 DIAGNOSIS — F32A Depression, unspecified: Secondary | ICD-10-CM | POA: Diagnosis not present

## 2021-04-21 DIAGNOSIS — I130 Hypertensive heart and chronic kidney disease with heart failure and stage 1 through stage 4 chronic kidney disease, or unspecified chronic kidney disease: Secondary | ICD-10-CM | POA: Diagnosis not present

## 2021-04-21 DIAGNOSIS — E1122 Type 2 diabetes mellitus with diabetic chronic kidney disease: Secondary | ICD-10-CM | POA: Diagnosis not present

## 2021-04-22 ENCOUNTER — Telehealth: Payer: Self-pay | Admitting: Cardiology

## 2021-04-22 DIAGNOSIS — F32A Depression, unspecified: Secondary | ICD-10-CM | POA: Diagnosis not present

## 2021-04-22 DIAGNOSIS — I130 Hypertensive heart and chronic kidney disease with heart failure and stage 1 through stage 4 chronic kidney disease, or unspecified chronic kidney disease: Secondary | ICD-10-CM | POA: Diagnosis not present

## 2021-04-22 DIAGNOSIS — E1122 Type 2 diabetes mellitus with diabetic chronic kidney disease: Secondary | ICD-10-CM | POA: Diagnosis not present

## 2021-04-22 DIAGNOSIS — D631 Anemia in chronic kidney disease: Secondary | ICD-10-CM | POA: Diagnosis not present

## 2021-04-22 DIAGNOSIS — G8929 Other chronic pain: Secondary | ICD-10-CM | POA: Diagnosis not present

## 2021-04-22 DIAGNOSIS — N1831 Chronic kidney disease, stage 3a: Secondary | ICD-10-CM | POA: Diagnosis not present

## 2021-04-22 DIAGNOSIS — I5033 Acute on chronic diastolic (congestive) heart failure: Secondary | ICD-10-CM | POA: Diagnosis not present

## 2021-04-22 DIAGNOSIS — I4891 Unspecified atrial fibrillation: Secondary | ICD-10-CM | POA: Diagnosis not present

## 2021-04-22 DIAGNOSIS — E1140 Type 2 diabetes mellitus with diabetic neuropathy, unspecified: Secondary | ICD-10-CM | POA: Diagnosis not present

## 2021-04-22 NOTE — Telephone Encounter (Signed)
Spoke with daughter Johny Shears who reports swelling in legs that occurs throughout the day. Says no swelling when getting out of bed each day Reports no SOB when resting but reports noticeable wheezing with transfers Reports patient sits in w/c all day Denies chest pain or dizziness Dominica Severin, NP will make visit today to do assessment and call office with report Medications reviewed with daughter and profile is up to date Awaiting call back from NP after assessment

## 2021-04-22 NOTE — Telephone Encounter (Signed)
Patient has a little bit of trouble breathing today, has extra fluid.  She wants to know if she can give extra dose of torsemide or what would we like her to do.

## 2021-04-22 NOTE — Telephone Encounter (Signed)
Patient's daughter informed and verbalized understanding of plan. 

## 2021-04-22 NOTE — Telephone Encounter (Signed)
Admitted [facility] on 03/18/2021 per Dominica Severin NP Per Suanne Marker, patient is taking torsemide 20 mg daily Does not weight due to gait instability Vitals have been stable Spoke with daughter Aniceto Boss to review medications, however; she was not at the home with medications and will call office back to review medications Advised that last lab work requested on 03/07/21 has not been done and should be done asap due to renal insufficiency making it difficult to adjust medications

## 2021-04-22 NOTE — Telephone Encounter (Signed)
Bradley Ferris, NP  Merlene Laughter, RN Phone Number: [phone]   [name]  I have assessed Ms Glendenning and her lungs are essentially clear. No JVD noted. No respiratory distress noted. She has +1 edema to LLE and +2 RLE up to knee. Daughter has corrected med box to torsemide 40mg  daily. VSS.  Thanks for your attention,  Dominica Severin, [facility]  Palliative Care Nurse Practitioner  [facility]

## 2021-04-24 ENCOUNTER — Other Ambulatory Visit: Payer: Self-pay

## 2021-04-24 ENCOUNTER — Inpatient Hospital Stay (HOSPITAL_COMMUNITY)
Admission: EM | Admit: 2021-04-24 | Discharge: 2021-04-28 | DRG: 637 | Disposition: A | Discharge status: Home Health | Payer: Medicare HMO | Attending: Family Medicine | Admitting: Family Medicine

## 2021-04-24 ENCOUNTER — Encounter (HOSPITAL_COMMUNITY): Payer: Self-pay | Admitting: Emergency Medicine

## 2021-04-24 ENCOUNTER — Emergency Department (HOSPITAL_COMMUNITY): Payer: Medicare HMO

## 2021-04-24 DIAGNOSIS — J9621 Acute and chronic respiratory failure with hypoxia: Secondary | ICD-10-CM | POA: Diagnosis present

## 2021-04-24 DIAGNOSIS — Z8616 Personal history of COVID-19: Secondary | ICD-10-CM

## 2021-04-24 DIAGNOSIS — Z6836 Body mass index (BMI) 36.0-36.9, adult: Secondary | ICD-10-CM | POA: Diagnosis not present

## 2021-04-24 DIAGNOSIS — R9431 Abnormal electrocardiogram [ECG] [EKG]: Secondary | ICD-10-CM | POA: Diagnosis not present

## 2021-04-24 DIAGNOSIS — N184 Chronic kidney disease, stage 4 (severe): Secondary | ICD-10-CM | POA: Diagnosis not present

## 2021-04-24 DIAGNOSIS — E875 Hyperkalemia: Secondary | ICD-10-CM | POA: Diagnosis not present

## 2021-04-24 DIAGNOSIS — E1122 Type 2 diabetes mellitus with diabetic chronic kidney disease: Secondary | ICD-10-CM | POA: Diagnosis present

## 2021-04-24 DIAGNOSIS — I5032 Chronic diastolic (congestive) heart failure: Secondary | ICD-10-CM | POA: Diagnosis present

## 2021-04-24 DIAGNOSIS — R0602 Shortness of breath: Secondary | ICD-10-CM | POA: Diagnosis not present

## 2021-04-24 DIAGNOSIS — I482 Chronic atrial fibrillation, unspecified: Secondary | ICD-10-CM | POA: Diagnosis present

## 2021-04-24 DIAGNOSIS — T426X5A Adverse effect of other antiepileptic and sedative-hypnotic drugs, initial encounter: Secondary | ICD-10-CM | POA: Diagnosis present

## 2021-04-24 DIAGNOSIS — E8779 Other fluid overload: Secondary | ICD-10-CM | POA: Diagnosis not present

## 2021-04-24 DIAGNOSIS — D631 Anemia in chronic kidney disease: Secondary | ICD-10-CM | POA: Diagnosis present

## 2021-04-24 DIAGNOSIS — D649 Anemia, unspecified: Secondary | ICD-10-CM | POA: Diagnosis not present

## 2021-04-24 DIAGNOSIS — T368X5A Adverse effect of other systemic antibiotics, initial encounter: Secondary | ICD-10-CM | POA: Diagnosis present

## 2021-04-24 DIAGNOSIS — R4182 Altered mental status, unspecified: Secondary | ICD-10-CM | POA: Diagnosis not present

## 2021-04-24 DIAGNOSIS — K5900 Constipation, unspecified: Secondary | ICD-10-CM | POA: Diagnosis not present

## 2021-04-24 DIAGNOSIS — E11649 Type 2 diabetes mellitus with hypoglycemia without coma: Secondary | ICD-10-CM | POA: Diagnosis not present

## 2021-04-24 DIAGNOSIS — E162 Hypoglycemia, unspecified: Secondary | ICD-10-CM | POA: Diagnosis not present

## 2021-04-24 DIAGNOSIS — N179 Acute kidney failure, unspecified: Secondary | ICD-10-CM | POA: Diagnosis not present

## 2021-04-24 DIAGNOSIS — Z8673 Personal history of transient ischemic attack (TIA), and cerebral infarction without residual deficits: Secondary | ICD-10-CM

## 2021-04-24 DIAGNOSIS — Z794 Long term (current) use of insulin: Secondary | ICD-10-CM

## 2021-04-24 DIAGNOSIS — R410 Disorientation, unspecified: Secondary | ICD-10-CM | POA: Diagnosis not present

## 2021-04-24 DIAGNOSIS — J9811 Atelectasis: Secondary | ICD-10-CM | POA: Diagnosis present

## 2021-04-24 DIAGNOSIS — E669 Obesity, unspecified: Secondary | ICD-10-CM | POA: Diagnosis present

## 2021-04-24 DIAGNOSIS — N189 Chronic kidney disease, unspecified: Secondary | ICD-10-CM

## 2021-04-24 DIAGNOSIS — R062 Wheezing: Secondary | ICD-10-CM | POA: Diagnosis not present

## 2021-04-24 DIAGNOSIS — G9341 Metabolic encephalopathy: Secondary | ICD-10-CM | POA: Diagnosis not present

## 2021-04-24 DIAGNOSIS — R680 Hypothermia, not associated with low environmental temperature: Secondary | ICD-10-CM | POA: Diagnosis present

## 2021-04-24 DIAGNOSIS — Z79899 Other long term (current) drug therapy: Secondary | ICD-10-CM

## 2021-04-24 DIAGNOSIS — M1711 Unilateral primary osteoarthritis, right knee: Secondary | ICD-10-CM | POA: Diagnosis present

## 2021-04-24 DIAGNOSIS — K219 Gastro-esophageal reflux disease without esophagitis: Secondary | ICD-10-CM | POA: Diagnosis present

## 2021-04-24 DIAGNOSIS — Z833 Family history of diabetes mellitus: Secondary | ICD-10-CM

## 2021-04-24 DIAGNOSIS — Z66 Do not resuscitate: Secondary | ICD-10-CM | POA: Diagnosis not present

## 2021-04-24 DIAGNOSIS — M47817 Spondylosis without myelopathy or radiculopathy, lumbosacral region: Secondary | ICD-10-CM | POA: Diagnosis present

## 2021-04-24 DIAGNOSIS — E441 Mild protein-calorie malnutrition: Secondary | ICD-10-CM | POA: Diagnosis not present

## 2021-04-24 DIAGNOSIS — E119 Type 2 diabetes mellitus without complications: Secondary | ICD-10-CM

## 2021-04-24 DIAGNOSIS — Z7401 Bed confinement status: Secondary | ICD-10-CM

## 2021-04-24 DIAGNOSIS — N390 Urinary tract infection, site not specified: Secondary | ICD-10-CM | POA: Diagnosis present

## 2021-04-24 DIAGNOSIS — Z20822 Contact with and (suspected) exposure to covid-19: Secondary | ICD-10-CM | POA: Diagnosis present

## 2021-04-24 DIAGNOSIS — I129 Hypertensive chronic kidney disease with stage 1 through stage 4 chronic kidney disease, or unspecified chronic kidney disease: Secondary | ICD-10-CM | POA: Diagnosis not present

## 2021-04-24 DIAGNOSIS — I130 Hypertensive heart and chronic kidney disease with heart failure and stage 1 through stage 4 chronic kidney disease, or unspecified chronic kidney disease: Secondary | ICD-10-CM | POA: Diagnosis present

## 2021-04-24 DIAGNOSIS — R0609 Other forms of dyspnea: Secondary | ICD-10-CM | POA: Diagnosis not present

## 2021-04-24 DIAGNOSIS — T68XXXA Hypothermia, initial encounter: Secondary | ICD-10-CM

## 2021-04-24 DIAGNOSIS — J811 Chronic pulmonary edema: Secondary | ICD-10-CM | POA: Diagnosis not present

## 2021-04-24 DIAGNOSIS — Z8249 Family history of ischemic heart disease and other diseases of the circulatory system: Secondary | ICD-10-CM

## 2021-04-24 DIAGNOSIS — I517 Cardiomegaly: Secondary | ICD-10-CM | POA: Diagnosis not present

## 2021-04-24 DIAGNOSIS — Z9981 Dependence on supplemental oxygen: Secondary | ICD-10-CM

## 2021-04-24 DIAGNOSIS — R609 Edema, unspecified: Secondary | ICD-10-CM | POA: Diagnosis not present

## 2021-04-24 DIAGNOSIS — E161 Other hypoglycemia: Secondary | ICD-10-CM | POA: Diagnosis not present

## 2021-04-24 DIAGNOSIS — J9601 Acute respiratory failure with hypoxia: Secondary | ICD-10-CM | POA: Diagnosis present

## 2021-04-24 DIAGNOSIS — R531 Weakness: Secondary | ICD-10-CM | POA: Diagnosis not present

## 2021-04-24 DIAGNOSIS — I10 Essential (primary) hypertension: Secondary | ICD-10-CM | POA: Diagnosis present

## 2021-04-24 LAB — COMPREHENSIVE METABOLIC PANEL
ALT: 14 U/L (ref 0–44)
AST: 17 U/L (ref 15–41)
Albumin: 3.3 g/dL — ABNORMAL LOW (ref 3.5–5.0)
Alkaline Phosphatase: 53 U/L (ref 38–126)
Anion gap: 10 (ref 5–15)
BUN: 82 mg/dL — ABNORMAL HIGH (ref 8–23)
CO2: 21 mmol/L — ABNORMAL LOW (ref 22–32)
Calcium: 8.5 mg/dL — ABNORMAL LOW (ref 8.9–10.3)
Chloride: 107 mmol/L (ref 98–111)
Creatinine, Ser: 4.12 mg/dL — ABNORMAL HIGH (ref 0.44–1.00)
GFR, Estimated: 10 mL/min — ABNORMAL LOW (ref >60)
Glucose, Bld: 194 mg/dL — ABNORMAL HIGH (ref 70–99)
Potassium: 5.2 mmol/L — ABNORMAL HIGH (ref 3.5–5.1)
Sodium: 138 mmol/L (ref 135–145)
Total Bilirubin: 0.4 mg/dL (ref 0.3–1.2)
Total Protein: 7.4 g/dL (ref 6.5–8.1)

## 2021-04-24 LAB — LACTIC ACID, PLASMA: Lactic Acid, Venous: 1.9 mmol/L (ref 0.5–1.9)

## 2021-04-24 LAB — RESP PANEL BY RT-PCR (FLU A&B, COVID) ARPGX2
Influenza A by PCR: NEGATIVE
Influenza B by PCR: NEGATIVE
SARS Coronavirus 2 by RT PCR: NEGATIVE

## 2021-04-24 LAB — URINALYSIS, ROUTINE W REFLEX MICROSCOPIC
Bilirubin Urine: NEGATIVE
Glucose, UA: NEGATIVE mg/dL
Hgb urine dipstick: NEGATIVE
Ketones, ur: NEGATIVE mg/dL
Nitrite: NEGATIVE
Protein, ur: NEGATIVE mg/dL
Specific Gravity, Urine: 1.015 (ref 1.005–1.030)
pH: 5.5 (ref 5.0–8.0)

## 2021-04-24 LAB — CBC WITH DIFFERENTIAL/PLATELET
Abs Immature Granulocytes: 0.03 10*3/uL (ref 0.00–0.07)
Basophils Absolute: 0 10*3/uL (ref 0.0–0.1)
Basophils Relative: 1 %
Eosinophils Absolute: 0.2 10*3/uL (ref 0.0–0.5)
Eosinophils Relative: 2 %
HCT: 27.5 % — ABNORMAL LOW (ref 36.0–46.0)
Hemoglobin: 8.5 g/dL — ABNORMAL LOW (ref 12.0–15.0)
Immature Granulocytes: 0 %
Lymphocytes Relative: 18 %
Lymphs Abs: 1.3 10*3/uL (ref 0.7–4.0)
MCH: 28.5 pg (ref 26.0–34.0)
MCHC: 30.9 g/dL (ref 30.0–36.0)
MCV: 92.3 fL (ref 80.0–100.0)
Monocytes Absolute: 0.5 10*3/uL (ref 0.1–1.0)
Monocytes Relative: 7 %
Neutro Abs: 4.9 10*3/uL (ref 1.7–7.7)
Neutrophils Relative %: 72 %
Platelets: 274 10*3/uL (ref 150–400)
RBC: 2.98 MIL/uL — ABNORMAL LOW (ref 3.87–5.11)
RDW: 15.5 % (ref 11.5–15.5)
WBC: 6.8 10*3/uL (ref 4.0–10.5)
nRBC: 0 % (ref 0.0–0.2)

## 2021-04-24 LAB — URINALYSIS, MICROSCOPIC (REFLEX): RBC / HPF: NONE SEEN RBC/hpf (ref 0–5)

## 2021-04-24 LAB — MAGNESIUM: Magnesium: 2.7 mg/dL — ABNORMAL HIGH (ref 1.7–2.4)

## 2021-04-24 LAB — CBG MONITORING, ED: Glucose-Capillary: 143 mg/dL — ABNORMAL HIGH (ref 70–99)

## 2021-04-24 LAB — AMMONIA: Ammonia: 19 umol/L (ref 9–35)

## 2021-04-24 LAB — TSH: TSH: 3.274 u[IU]/mL (ref 0.350–4.500)

## 2021-04-24 MED ORDER — LACTATED RINGERS IV BOLUS
500.0000 mL | Freq: Once | INTRAVENOUS | Status: AC
  Administered 2021-04-24: 500 mL via INTRAVENOUS

## 2021-04-24 MED ORDER — SODIUM CHLORIDE 0.9 % IV SOLN: INTRAVENOUS | Status: DC

## 2021-04-24 NOTE — ED Notes (Signed)
Last dose taken today of ABT for UTI per daughter.

## 2021-04-24 NOTE — ED Triage Notes (Signed)
Pt to the ED with Hypoglycemia from home.  Pt was found to have a CBG in the 50's and lethargic. Pt was given oral glucose of 15 grams and CBG rose to the 80's.  CBG is 183 at this time.  Pt has a recent history of a UTI with her last dose taken today.

## 2021-04-25 ENCOUNTER — Observation Stay (HOSPITAL_COMMUNITY): Payer: Medicare HMO

## 2021-04-25 ENCOUNTER — Other Ambulatory Visit (HOSPITAL_COMMUNITY): Payer: Self-pay | Admitting: *Deleted

## 2021-04-25 DIAGNOSIS — R0609 Other forms of dyspnea: Secondary | ICD-10-CM

## 2021-04-25 DIAGNOSIS — G9341 Metabolic encephalopathy: Secondary | ICD-10-CM | POA: Diagnosis not present

## 2021-04-25 DIAGNOSIS — I129 Hypertensive chronic kidney disease with stage 1 through stage 4 chronic kidney disease, or unspecified chronic kidney disease: Secondary | ICD-10-CM | POA: Diagnosis not present

## 2021-04-25 DIAGNOSIS — N184 Chronic kidney disease, stage 4 (severe): Secondary | ICD-10-CM | POA: Diagnosis not present

## 2021-04-25 DIAGNOSIS — E8779 Other fluid overload: Secondary | ICD-10-CM | POA: Diagnosis not present

## 2021-04-25 DIAGNOSIS — N179 Acute kidney failure, unspecified: Secondary | ICD-10-CM | POA: Diagnosis not present

## 2021-04-25 DIAGNOSIS — D649 Anemia, unspecified: Secondary | ICD-10-CM | POA: Diagnosis not present

## 2021-04-25 DIAGNOSIS — R410 Disorientation, unspecified: Secondary | ICD-10-CM | POA: Diagnosis not present

## 2021-04-25 DIAGNOSIS — T68XXXA Hypothermia, initial encounter: Secondary | ICD-10-CM

## 2021-04-25 DIAGNOSIS — R4182 Altered mental status, unspecified: Secondary | ICD-10-CM | POA: Diagnosis not present

## 2021-04-25 DIAGNOSIS — E875 Hyperkalemia: Secondary | ICD-10-CM | POA: Diagnosis not present

## 2021-04-25 DIAGNOSIS — E441 Mild protein-calorie malnutrition: Secondary | ICD-10-CM

## 2021-04-25 LAB — BLOOD GAS, VENOUS
Acid-base deficit: 3.9 mmol/L — ABNORMAL HIGH (ref 0.0–2.0)
Bicarbonate: 20.6 mmol/L (ref 20.0–28.0)
FIO2: 28
O2 Saturation: 93.7 %
Patient temperature: 36.8
pCO2, Ven: 47.8 mmHg (ref 44.0–60.0)
pH, Ven: 7.28 (ref 7.250–7.430)
pO2, Ven: 80.9 mmHg — ABNORMAL HIGH (ref 32.0–45.0)

## 2021-04-25 LAB — COMPREHENSIVE METABOLIC PANEL
ALT: 22 U/L (ref 0–44)
ALT: 21 U/L (ref 0–44)
AST: 28 U/L (ref 15–41)
AST: 25 U/L (ref 15–41)
Albumin: 3.2 g/dL — ABNORMAL LOW (ref 3.5–5.0)
Albumin: 3 g/dL — ABNORMAL LOW (ref 3.5–5.0)
Alkaline Phosphatase: 55 U/L (ref 38–126)
Alkaline Phosphatase: 54 U/L (ref 38–126)
Anion gap: 9 (ref 5–15)
Anion gap: 8 (ref 5–15)
BUN: 82 mg/dL — ABNORMAL HIGH (ref 8–23)
BUN: 80 mg/dL — ABNORMAL HIGH (ref 8–23)
CO2: 20 mmol/L — ABNORMAL LOW (ref 22–32)
CO2: 22 mmol/L (ref 22–32)
Calcium: 8.2 mg/dL — ABNORMAL LOW (ref 8.9–10.3)
Calcium: 8 mg/dL — ABNORMAL LOW (ref 8.9–10.3)
Chloride: 105 mmol/L (ref 98–111)
Chloride: 104 mmol/L (ref 98–111)
Creatinine, Ser: 4.31 mg/dL — ABNORMAL HIGH (ref 0.44–1.00)
Creatinine, Ser: 4.21 mg/dL — ABNORMAL HIGH (ref 0.44–1.00)
GFR, Estimated: 10 mL/min — ABNORMAL LOW (ref >60)
GFR, Estimated: 10 mL/min — ABNORMAL LOW (ref >60)
Glucose, Bld: 430 mg/dL — ABNORMAL HIGH (ref 70–99)
Glucose, Bld: 339 mg/dL — ABNORMAL HIGH (ref 70–99)
Potassium: 6.2 mmol/L — ABNORMAL HIGH (ref 3.5–5.1)
Potassium: 5.8 mmol/L — ABNORMAL HIGH (ref 3.5–5.1)
Sodium: 134 mmol/L — ABNORMAL LOW (ref 135–145)
Sodium: 134 mmol/L — ABNORMAL LOW (ref 135–145)
Total Bilirubin: 0.2 mg/dL — ABNORMAL LOW (ref 0.3–1.2)
Total Bilirubin: 0.1 mg/dL — ABNORMAL LOW (ref 0.3–1.2)
Total Protein: 6.9 g/dL (ref 6.5–8.1)
Total Protein: 6.7 g/dL (ref 6.5–8.1)

## 2021-04-25 LAB — MAGNESIUM: Magnesium: 2.7 mg/dL — ABNORMAL HIGH (ref 1.7–2.4)

## 2021-04-25 LAB — ECHOCARDIOGRAM COMPLETE
AR max vel: 1.79 cm2
AV Area VTI: 1.76 cm2
AV Area mean vel: 1.85 cm2
AV Mean grad: 8 mmHg
AV Peak grad: 13.8 mmHg
Ao pk vel: 1.86 m/s
Area-P 1/2: 2.16 cm2
Height: 64 in
MV VTI: 1.2 cm2
S' Lateral: 3.1 cm
Weight: 3425.07 oz

## 2021-04-25 LAB — GLUCOSE, CAPILLARY
Glucose-Capillary: 192 mg/dL — ABNORMAL HIGH (ref 70–99)
Glucose-Capillary: 195 mg/dL — ABNORMAL HIGH (ref 70–99)
Glucose-Capillary: 215 mg/dL — ABNORMAL HIGH (ref 70–99)
Glucose-Capillary: 221 mg/dL — ABNORMAL HIGH (ref 70–99)
Glucose-Capillary: 281 mg/dL — ABNORMAL HIGH (ref 70–99)
Glucose-Capillary: 388 mg/dL — ABNORMAL HIGH (ref 70–99)

## 2021-04-25 LAB — TSH: TSH: 1.796 u[IU]/mL (ref 0.350–4.500)

## 2021-04-25 LAB — TROPONIN I (HIGH SENSITIVITY)
Troponin I (High Sensitivity): 8 ng/L (ref <18)
Troponin I (High Sensitivity): 9 ng/L (ref <18)

## 2021-04-25 LAB — BRAIN NATRIURETIC PEPTIDE: B Natriuretic Peptide: 290 pg/mL — ABNORMAL HIGH (ref 0.0–100.0)

## 2021-04-25 MED ORDER — ALBUTEROL SULFATE (2.5 MG/3ML) 0.083% IN NEBU: 2.5000 mg | INHALATION_SOLUTION | RESPIRATORY_TRACT | Status: DC | PRN

## 2021-04-25 MED ORDER — PERFLUTREN LIPID MICROSPHERE
1.0000 mL | INTRAVENOUS | Status: AC | PRN
Start: 2021-04-25 — End: 2021-04-25
  Administered 2021-04-25: 3 mL via INTRAVENOUS
  Filled 2021-04-25: qty 10

## 2021-04-25 MED ORDER — SIMVASTATIN 20 MG PO TABS
20.0000 mg | ORAL_TABLET | Freq: Every day | ORAL | Status: DC
  Administered 2021-04-25 – 2021-04-27 (×3): 20 mg via ORAL
  Filled 2021-04-25 (×3): qty 1

## 2021-04-25 MED ORDER — ONDANSETRON HCL 4 MG/2ML IJ SOLN
4.0000 mg | Freq: Four times a day (QID) | INTRAMUSCULAR | Status: DC | PRN
  Administered 2021-04-26 – 2021-04-27 (×3): 4 mg via INTRAVENOUS
  Filled 2021-04-25 (×3): qty 2

## 2021-04-25 MED ORDER — INSULIN ASPART 100 UNIT/ML IJ SOLN
12.0000 [IU] | Freq: Once | INTRAMUSCULAR | Status: AC
  Administered 2021-04-25: 12 [IU] via SUBCUTANEOUS

## 2021-04-25 MED ORDER — FUROSEMIDE 10 MG/ML IJ SOLN
40.0000 mg | Freq: Once | INTRAMUSCULAR | Status: AC
  Administered 2021-04-25: 40 mg via INTRAVENOUS
  Filled 2021-04-25: qty 4

## 2021-04-25 MED ORDER — FAMOTIDINE 20 MG PO TABS
20.0000 mg | ORAL_TABLET | Freq: Every day | ORAL | Status: DC
  Administered 2021-04-25 – 2021-04-28 (×4): 20 mg via ORAL
  Filled 2021-04-25 (×4): qty 1

## 2021-04-25 MED ORDER — INSULIN ASPART 100 UNIT/ML IJ SOLN
0.0000 [IU] | Freq: Every day | INTRAMUSCULAR | Status: DC
  Administered 2021-04-27: 2 [IU] via SUBCUTANEOUS

## 2021-04-25 MED ORDER — HYDRALAZINE HCL 25 MG PO TABS
100.0000 mg | ORAL_TABLET | Freq: Three times a day (TID) | ORAL | Status: DC
  Administered 2021-04-25 – 2021-04-28 (×10): 100 mg via ORAL
  Filled 2021-04-25 (×10): qty 4

## 2021-04-25 MED ORDER — AMLODIPINE BESYLATE 5 MG PO TABS
10.0000 mg | ORAL_TABLET | Freq: Every day | ORAL | Status: DC
  Administered 2021-04-25 – 2021-04-28 (×4): 10 mg via ORAL
  Filled 2021-04-25 (×4): qty 2

## 2021-04-25 MED ORDER — DULOXETINE HCL 30 MG PO CPEP: 30.0000 mg | ORAL_CAPSULE | Freq: Every day | ORAL | Status: DC

## 2021-04-25 MED ORDER — SODIUM ZIRCONIUM CYCLOSILICATE 10 G PO PACK
10.0000 g | PACK | Freq: Once | ORAL | Status: AC
  Administered 2021-04-25: 10 g via ORAL
  Filled 2021-04-25: qty 1

## 2021-04-25 MED ORDER — APIXABAN 2.5 MG PO TABS: 2.5000 mg | ORAL_TABLET | Freq: Two times a day (BID) | ORAL | Status: DC

## 2021-04-25 MED ORDER — HEPARIN SODIUM (PORCINE) 5000 UNIT/ML IJ SOLN
5000.0000 [IU] | Freq: Three times a day (TID) | INTRAMUSCULAR | Status: DC
  Administered 2021-04-25 – 2021-04-28 (×9): 5000 [IU] via SUBCUTANEOUS
  Filled 2021-04-25 (×9): qty 1

## 2021-04-25 MED ORDER — ONDANSETRON HCL 4 MG PO TABS: 4.0000 mg | ORAL_TABLET | Freq: Four times a day (QID) | ORAL | Status: DC | PRN

## 2021-04-25 MED ORDER — ACETAMINOPHEN 325 MG PO TABS
650.0000 mg | ORAL_TABLET | Freq: Four times a day (QID) | ORAL | Status: DC | PRN
  Administered 2021-04-26: 650 mg via ORAL
  Filled 2021-04-25: qty 2

## 2021-04-25 MED ORDER — OXYCODONE HCL 5 MG PO TABS
5.0000 mg | ORAL_TABLET | ORAL | Status: DC | PRN
  Administered 2021-04-25 – 2021-04-27 (×2): 5 mg via ORAL
  Filled 2021-04-25 (×2): qty 1

## 2021-04-25 MED ORDER — INSULIN DETEMIR 100 UNIT/ML ~~LOC~~ SOLN
15.0000 [IU] | Freq: Every day | SUBCUTANEOUS | Status: DC
  Administered 2021-04-25 – 2021-04-26 (×2): 15 [IU] via SUBCUTANEOUS
  Filled 2021-04-25 (×3): qty 0.15

## 2021-04-25 MED ORDER — DARBEPOETIN ALFA 150 MCG/0.3ML IJ SOSY
150.0000 ug | PREFILLED_SYRINGE | Freq: Once | INTRAMUSCULAR | Status: DC
  Filled 2021-04-25: qty 0.3

## 2021-04-25 MED ORDER — ACETAMINOPHEN 650 MG RE SUPP: 650.0000 mg | Freq: Four times a day (QID) | RECTAL | Status: DC | PRN

## 2021-04-25 MED ORDER — INSULIN ASPART 100 UNIT/ML IJ SOLN
0.0000 [IU] | Freq: Three times a day (TID) | INTRAMUSCULAR | Status: DC
Start: 2021-04-25 — End: 2021-04-28
  Administered 2021-04-25: 5 [IU] via SUBCUTANEOUS
  Administered 2021-04-25: 3 [IU] via SUBCUTANEOUS
  Administered 2021-04-25: 5 [IU] via SUBCUTANEOUS
  Administered 2021-04-26 (×3): 3 [IU] via SUBCUTANEOUS
  Administered 2021-04-27 (×3): 5 [IU] via SUBCUTANEOUS
  Administered 2021-04-28: 11 [IU] via SUBCUTANEOUS
  Administered 2021-04-28: 3 [IU] via SUBCUTANEOUS

## 2021-04-25 MED ORDER — SODIUM ZIRCONIUM CYCLOSILICATE 10 G PO PACK
10.0000 g | PACK | Freq: Three times a day (TID) | ORAL | Status: DC
  Administered 2021-04-25 – 2021-04-26 (×4): 10 g via ORAL
  Filled 2021-04-25 (×4): qty 1

## 2021-04-25 NOTE — Consult Note (Signed)
[facility] Renal Consultation Note  Requesting MD: Shahmehdi Indication for Consultation: A on CRF  HPI:  [name] is a 86 y.o. female with past medical history significant for type 2 diabetes mellitus, hypertension, reported CHF but most recent echo appears normal, reportedly has history of stroke and possibly brain hemorrhage, atrial fibrillation.  History taking is difficult from the patient due to her altered MS and there is not much in terms of chart review.  There are some recent notes from hospice of [facility] where daughter had reported shortness of breath and lower extremity edema so patient had been diuresed recently.  She seems to have some CKD with creatinine between 1.5 and 2 with last check noted on 02/28/2021.  She was brought to the emergency room last night with hypoglycemia.  Was lethargic.  Creatinine was noted to be 4.12 which was much different than previous and potassium of 6.2.  She was admitted-vital signs have been stable.  She has had at least 250 of urine output.  Creatinine has not changed much since admission.  Of note, preadmission medications possibly includes Bactrim?  And potassium.  As well as possibly 3 blood pressure medicines and Neurontin 300 3 times daily.  She was given Lokelma this morning as potassium was 5.8-also given fluids in the form of lactated Ringer's.  This AM reportedly MS is better-  she does confirm that she has been on abx and potassium   Creatinine, Ser  Date/Time Value Ref Range Status  04/25/2021 04:16 AM 4.21 (H) 0.44 - 1.00 mg/dL Final  04/25/2021 01:55 AM 4.31 (H) 0.44 - 1.00 mg/dL Final  04/24/2021 06:15 PM 4.12 (H) 0.44 - 1.00 mg/dL Final  02/28/2021 08:55 AM 1.58 (H) 0.44 - 1.00 mg/dL Final  02/26/2021 04:24 AM 2.01 (H) 0.44 - 1.00 mg/dL Final  02/25/2021 05:19 AM 1.86 (H) 0.44 - 1.00 mg/dL Final  02/24/2021 08:55 AM 1.62 (H) 0.44 - 1.00 mg/dL Final  10/16/2020 04:12 AM 2.04 (H) 0.44 - 1.00 mg/dL Final   10/15/2020 01:39 AM 1.87 (H) 0.44 - 1.00 mg/dL Final  10/14/2020 01:19 AM 1.99 (H) 0.44 - 1.00 mg/dL Final  10/12/2020 12:51 AM 1.82 (H) 0.44 - 1.00 mg/dL Final  10/11/2020 08:12 AM 1.86 (H) 0.44 - 1.00 mg/dL Final  10/09/2020 10:08 AM 1.60 (H) 0.44 - 1.00 mg/dL Final  10/01/2020 04:56 AM 1.77 (H) 0.44 - 1.00 mg/dL Final  09/27/2020 01:27 AM 1.78 (H) 0.44 - 1.00 mg/dL Final  09/26/2020 03:23 AM 2.16 (H) 0.44 - 1.00 mg/dL Final  09/25/2020 04:40 AM 1.93 (H) 0.44 - 1.00 mg/dL Final  09/24/2020 09:37 PM 1.58 (H) 0.44 - 1.00 mg/dL Final  09/24/2020 08:20 PM 0.70 0.44 - 1.00 mg/dL Final  02/12/2020 06:53 AM 2.06 (H) 0.44 - 1.00 mg/dL Final  02/11/2020 08:13 AM 1.80 (H) 0.44 - 1.00 mg/dL Final  02/10/2020 07:41 AM 1.84 (H) 0.44 - 1.00 mg/dL Final  02/09/2020 07:58 AM 1.71 (H) 0.44 - 1.00 mg/dL Final  02/08/2020 10:03 AM 2.00 (H) 0.44 - 1.00 mg/dL Final  01/03/2020 07:07 AM 1.30 (H) 0.44 - 1.00 mg/dL Final  01/01/2020 05:30 AM 1.62 (H) 0.44 - 1.00 mg/dL Final  12/31/2019 07:18 AM 1.91 (H) 0.44 - 1.00 mg/dL Final  12/30/2019 07:09 AM 1.63 (H) 0.44 - 1.00 mg/dL Final  12/29/2019 05:56 AM 1.82 (H) 0.44 - 1.00 mg/dL Final  12/28/2019 06:21 AM 1.74 (H) 0.44 - 1.00 mg/dL Final  12/27/2019 04:25 AM 1.45 (H) 0.44 - 1.00 mg/dL Final  12/26/2019 08:18 AM 1.31 (H) 0.44 - 1.00 mg/dL Final  07/31/2019 04:57 AM 1.49 (H) 0.44 - 1.00 mg/dL Final  07/29/2019 05:43 AM 1.62 (H) 0.44 - 1.00 mg/dL Final  07/27/2019 04:26 AM 1.53 (H) 0.44 - 1.00 mg/dL Final  07/25/2019 04:44 AM 2.14 (H) 0.44 - 1.00 mg/dL Final  07/24/2019 04:19 AM 2.03 (H) 0.44 - 1.00 mg/dL Final  07/23/2019 01:21 PM 1.54 (H) 0.44 - 1.00 mg/dL Final  06/23/2019 02:59 PM 1.44 (H) 0.44 - 1.00 mg/dL Final  04/20/2019 09:36 PM 1.45 (H) 0.44 - 1.00 mg/dL Final  05/06/2018 05:33 PM 1.42 (H) 0.44 - 1.00 mg/dL Final  07/14/2017 05:55 AM 1.60 (H) 0.44 - 1.00 mg/dL Final  07/13/2017 05:54 AM 1.72 (H) 0.44 - 1.00 mg/dL Final  07/12/2017 05:58 AM 1.77  (H) 0.44 - 1.00 mg/dL Final  07/11/2017 08:31 AM 1.57 (H) 0.44 - 1.00 mg/dL Final  07/11/2017 04:09 AM 1.58 (H) 0.44 - 1.00 mg/dL Final  07/11/2017 01:29 AM 1.64 (H) 0.44 - 1.00 mg/dL Final  07/10/2017 08:50 PM 1.74 (H) 0.44 - 1.00 mg/dL Final  07/10/2017 05:04 PM 1.75 (H) 0.44 - 1.00 mg/dL Final  07/10/2017 11:45 AM 1.84 (H) 0.44 - 1.00 mg/dL Final  07/10/2017 06:07 AM 1.71 (H) 0.44 - 1.00 mg/dL Final  07/09/2017 05:29 AM 1.63 (H) 0.44 - 1.00 mg/dL Final     PMHx:   Past Medical History:  Diagnosis Date   Anemia    Arthritis    Chronic diastolic heart failure (HCC)    CKD (chronic kidney disease) stage 4, GFR 15-29 ml/min (HCC)    Essential hypertension    GERD (gastroesophageal reflux disease)    Hemorrhoids    History of stroke    Intraparenchymal hemorrhage of brain (HCC)    PAF (paroxysmal atrial fibrillation) ([place])    Type 2 diabetes mellitus ([place])     Past Surgical History:  Procedure Laterality Date   BACK SURGERY     CATARACT EXTRACTION W/PHACO Left 02/28/2013   Procedure: CATARACT EXTRACTION PHACO AND INTRAOCULAR LENS PLACEMENT (IOL) CDE=15.60;  Surgeon: Elta Guadeloupe T. Gershon Crane, MD;  Location: [facility];  Service: Ophthalmology;  Laterality: Left;   CHOLECYSTECTOMY     COLONOSCOPY  12/12/2003   RMR: Normal rectum.  Long capacious tortuous colon, colonic mucosa appeared normal   COLONOSCOPY N/A 03/21/2014   Dr. [name]: internal and external hemorrhoids, torturous colon, colonic diverticulosis    ESOPHAGOGASTRODUODENOSCOPY  12/28/2001   AST:MHDQQ sliding hiatal hernia/. Three small bulbar ulcers, two with stigmata of bleeding and these were coagulated using heater probe unit    ESOPHAGOGASTRODUODENOSCOPY N/A 03/21/2014   Dr. [name]: cervical esophageal web s/p dilation, negative H.pylori    EYE SURGERY     HIP FRACTURE SURGERY  2008   JOINT REPLACEMENT     Rt hip, ????? sounds like just for fracture   MALONEY DILATION N/A 03/21/2014   Procedure: [name] DILATION;  Surgeon:  [name], MD;  Location: [facility];  Service: Endoscopy;  Laterality: N/A;   SAVORY DILATION N/A 03/21/2014   Procedure: SAVORY DILATION;  Surgeon: [name], MD;  Location: [facility];  Service: Endoscopy;  Laterality: N/A;    Family Hx:  Family History  Problem Relation Age of Onset   Crohn's disease Daughter    Hypertension Mother    Hypertension Sister    Diabetes Brother    Diabetes Sister    Diabetes Sister    Heart attack Sister    Colon cancer Neg  Hx     Social History:  reports that she has never smoked. She has never used smokeless tobacco. She reports that she does not drink alcohol and does not use drugs.  Allergies: No Known Allergies  Medications: Prior to Admission medications   Medication Sig Start Date End Date Taking? Authorizing Provider  acetaminophen (TYLENOL) 325 MG tablet Take 2 tablets (650 mg total) by mouth every 6 (six) hours as needed for mild pain, fever or headache (or Fever >/= 101). 07/31/19  Yes Emokpae, Courage, MD  albuterol (VENTOLIN HFA) 108 (90 Base) MCG/ACT inhaler Inhale 2 puffs into the lungs every 6 (six) hours as needed for wheezing or shortness of breath.   Yes [provider]  amLODipine (NORVASC) 10 MG tablet Take 1 tablet (10 mg total) by mouth daily. 09/29/20  Yes Patrecia Pour, MD  carvedilol (COREG) 12.5 MG tablet Take 1 tablet (12.5 mg total) by mouth 2 (two) times daily with a meal. 02/28/21 04/24/21 Yes Shahmehdi, Seyed A, MD  DULoxetine (CYMBALTA) 30 MG capsule Take 1 capsule (30 mg total) by mouth daily. 10/11/20  Yes Angiulli, Lavon Paganini, PA-C  famotidine (PEPCID) 20 MG tablet Take 20 mg by mouth daily. 01/29/21  Yes [provider]  gabapentin (NEURONTIN) 300 MG capsule Take 300 mg by mouth 3 (three) times daily.   Yes [provider]  hydrALAZINE (APRESOLINE) 100 MG tablet Take 1 tablet (100 mg total) by mouth 3 (three) times daily. 09/30/20  Yes Patrecia Pour, MD  hydrocortisone (ANUSOL-HC)  2.5 % rectal cream Place 1 application rectally 2 (two) times daily as needed for hemorrhoids or anal itching. 01/03/20  Yes Barton Dubois, MD  insulin aspart (NOVOLOG) 100 UNIT/ML FlexPen Inject 0-10 Units into the skin 3 (three) times daily with meals. insulin aspart (novoLOG) injection 0-10 Units 0-10 Units Subcutaneous, 3 times daily with meals CBG < 70: Implement Hypoglycemia Standing Orders and refer to Hypoglycemia Standing Orders sidebar report  CBG 70 - 120: 0 unit CBG 121 - 150: 0 unit  CBG 151 - 200: 1 unit CBG 201 - 250: 2 units CBG 251 - 300: 4 units CBG 301 - 350: 6 units  CBG 351 - 400: 8 units  CBG > 400: 10 units 07/31/19  Yes Emokpae, Courage, MD  insulin detemir (LEVEMIR) 100 UNIT/ML injection Inject 0.22 mLs (22 Units total) into the skin 2 (two) times daily. 10/16/20  Yes Dessa Phi, DO  pantoprazole (PROTONIX) 40 MG tablet Take 1 tablet by mouth daily. 09/06/20  Yes [provider]  potassium chloride (KLOR-CON) 10 MEQ tablet Take 10 mEq by mouth 2 (two) times daily. 03/29/21  Yes [provider]  simvastatin (ZOCOR) 20 MG tablet Take 20 mg by mouth at bedtime. 01/29/21  Yes [provider]  torsemide (DEMADEX) 20 MG tablet Take 1 tablet (20 mg total) by mouth 2 (two) times daily. 02/28/21 04/24/21 Yes Shahmehdi, Valeria Batman, MD  ELIQUIS 2.5 MG TABS tablet Take 2.5 mg by mouth 2 (two) times daily. Patient not taking: Reported on 04/24/2021 01/29/21   [provider]  potassium chloride (KLOR-CON) 10 MEQ tablet Take 1 tablet (10 mEq total) by mouth 2 (two) times daily. 02/28/21 04/22/21  Deatra James, MD  sulfamethoxazole-trimethoprim (BACTRIM DS) 800-160 MG tablet SMARTSIG:1 Tablet(s) By Mouth Every 12 Hours Patient not taking: Reported on 04/24/2021 04/17/21   [provider]    I have reviewed the patient's current medications.  Labs:  Results for orders placed  or performed during the hospital encounter of 04/24/21 (from the past 48  hour(s))  CBG monitoring, ED     Status: Abnormal   Collection Time: 04/24/21  5:41 PM  Result Value Ref Range   Glucose-Capillary 143 (H) 70 - 99 mg/dL    Comment: Glucose reference range applies only to samples taken after fasting for at least 8 hours.  CBC with Differential     Status: Abnormal   Collection Time: 04/24/21  6:15 PM  Result Value Ref Range   WBC 6.8 4.0 - 10.5 K/uL   RBC 2.98 (L) 3.87 - 5.11 MIL/uL   Hemoglobin 8.5 (L) 12.0 - 15.0 g/dL   HCT 27.5 (L) 36.0 - 46.0 %   MCV 92.3 80.0 - 100.0 fL   MCH 28.5 26.0 - 34.0 pg   MCHC 30.9 30.0 - 36.0 g/dL   RDW 15.5 11.5 - 15.5 %   Platelets 274 150 - 400 K/uL   nRBC 0.0 0.0 - 0.2 %   Neutrophils Relative % 72 %   Neutro Abs 4.9 1.7 - 7.7 K/uL   Lymphocytes Relative 18 %   Lymphs Abs 1.3 0.7 - 4.0 K/uL   Monocytes Relative 7 %   Monocytes Absolute 0.5 0.1 - 1.0 K/uL   Eosinophils Relative 2 %   Eosinophils Absolute 0.2 0.0 - 0.5 K/uL   Basophils Relative 1 %   Basophils Absolute 0.0 0.0 - 0.1 K/uL   Immature Granulocytes 0 %   Abs Immature Granulocytes 0.03 0.00 - 0.07 K/uL    Comment: Performed at [facility], [address] [place], [place] [address]  Comprehensive metabolic panel     Status: Abnormal   Collection Time: 04/24/21  6:15 PM  Result Value Ref Range   Sodium 138 135 - 145 mmol/L   Potassium 5.2 (H) 3.5 - 5.1 mmol/L   Chloride 107 98 - 111 mmol/L   CO2 21 (L) 22 - 32 mmol/L   Glucose, Bld 194 (H) 70 - 99 mg/dL    Comment: Glucose reference range applies only to samples taken after fasting for at least 8 hours.   BUN 82 (H) 8 - 23 mg/dL   Creatinine, Ser 4.12 (H) 0.44 - 1.00 mg/dL   Calcium 8.5 (L) 8.9 - 10.3 mg/dL   Total Protein 7.4 6.5 - 8.1 g/dL   Albumin 3.3 (L) 3.5 - 5.0 g/dL   AST 17 15 - 41 U/L   ALT 14 0 - 44 U/L   Alkaline Phosphatase 53 38 - 126 U/L   Total Bilirubin 0.4 0.3 - 1.2 mg/dL   GFR, Estimated 10 (L) >60 mL/min    Comment: (NOTE) Calculated using the CKD-EPI Creatinine  Equation (2021)    Anion gap 10 5 - 15    Comment: Performed at [facility], [address], [place], [place] [address]  Lactic acid, plasma     Status: None   Collection Time: 04/24/21  6:15 PM  Result Value Ref Range   Lactic Acid, Venous 1.9 0.5 - 1.9 mmol/L    Comment: Performed at [facility], [address] [place], [place] [address]  Culture, blood (routine x 2)     Status: None (Preliminary result)   Collection Time: 04/24/21  6:15 PM   Specimen: BLOOD LEFT HAND  Result Value Ref Range   Specimen Description BLOOD LEFT HAND    Special Requests      BOTTLES DRAWN AEROBIC AND ANAEROBIC Blood Culture adequate volume   Culture  NO GROWTH < 24 HOURS Performed at [facility], [address], [place], [place] [address]    Report Status PENDING   Culture, blood (routine x 2)     Status: None (Preliminary result)   Collection Time: 04/24/21  6:15 PM   Specimen: BLOOD RIGHT HAND  Result Value Ref Range   Specimen Description BLOOD RIGHT HAND    Special Requests      BOTTLES DRAWN AEROBIC AND ANAEROBIC Blood Culture adequate volume   Culture      NO GROWTH < 24 HOURS Performed at [facility], [address], [place], [place] [address]    Report Status PENDING   Magnesium     Status: Abnormal   Collection Time: 04/24/21  6:15 PM  Result Value Ref Range   Magnesium 2.7 (H) 1.7 - 2.4 mg/dL    Comment: Performed at [facility], [address], [place], [place] [address]  Urinalysis, Routine w reflex microscopic Urine, Clean Catch     Status: Abnormal   Collection Time: 04/24/21  8:05 PM  Result Value Ref Range   Color, Urine YELLOW YELLOW   APPearance CLEAR CLEAR   Specific Gravity, Urine 1.015 1.005 - 1.030   pH 5.5 5.0 - 8.0   Glucose, UA NEGATIVE NEGATIVE mg/dL   Hgb urine dipstick NEGATIVE NEGATIVE   Bilirubin Urine NEGATIVE NEGATIVE   Ketones, ur NEGATIVE NEGATIVE mg/dL   Protein, ur NEGATIVE NEGATIVE mg/dL   Nitrite NEGATIVE NEGATIVE   Leukocytes,Ua SMALL (A)  NEGATIVE    Comment: Performed at [facility], [address] [place], [place] [address]  Urinalysis, Microscopic (reflex)     Status: Abnormal   Collection Time: 04/24/21  8:05 PM  Result Value Ref Range   RBC / HPF NONE SEEN 0 - 5 RBC/hpf   WBC, UA 0-5 0 - 5 WBC/hpf   Bacteria, UA RARE (A) NONE SEEN   Squamous Epithelial / LPF 0-5 0 - 5    Comment: Performed at [facility], [address] [place], [place] [address]  Resp Panel by RT-PCR (Flu A&B, Covid) Nasopharyngeal Swab     Status: None   Collection Time: 04/24/21  8:14 PM   Specimen: Nasopharyngeal Swab; Nasopharyngeal(NP) swabs in vial transport medium  Result Value Ref Range   SARS Coronavirus 2 by RT PCR NEGATIVE NEGATIVE    Comment: (NOTE) SARS-CoV-2 target nucleic acids are NOT DETECTED.  The SARS-CoV-2 RNA is generally detectable in upper respiratory specimens during the acute phase of infection. The lowest concentration of SARS-CoV-2 viral copies this assay can detect is 138 copies/mL. A negative result does not preclude SARS-Cov-2 infection and should not be used as the sole basis for treatment or other patient management decisions. A negative result may occur with  improper specimen collection/handling, submission of specimen other than nasopharyngeal swab, presence of viral mutation(s) within the areas targeted by this assay, and inadequate number of viral copies(<138 copies/mL). A negative result must be combined with clinical observations, patient history, and epidemiological information. The expected result is Negative.  Fact Sheet for Patients:  EntrepreneurPulse.com.au  Fact Sheet for Healthcare Providers:  IncredibleEmployment.be  This test is no t yet approved or cleared by the [place] FDA and  has been authorized for detection and/or diagnosis of SARS-CoV-2 by FDA under an Emergency Use Authorization (EUA). This EUA will remain  in effect (meaning this test can  be used) for the duration of the COVID-19 declaration under Section 564(b)(1) of the Act, 21 U.S.C.section 360bbb-3(b)(1),  unless the authorization is terminated  or revoked sooner.       Influenza A by PCR NEGATIVE NEGATIVE   Influenza B by PCR NEGATIVE NEGATIVE    Comment: (NOTE) The Xpert Xpress SARS-CoV-2/FLU/RSV plus assay is intended as an aid in the diagnosis of influenza from Nasopharyngeal swab specimens and should not be used as a sole basis for treatment. Nasal washings and aspirates are unacceptable for Xpert Xpress SARS-CoV-2/FLU/RSV testing.  Fact Sheet for Patients: EntrepreneurPulse.com.au  Fact Sheet for Healthcare Providers: IncredibleEmployment.be  This test is not yet approved or cleared by the [place] FDA and has been authorized for detection and/or diagnosis of SARS-CoV-2 by FDA under an Emergency Use Authorization (EUA). This EUA will remain in effect (meaning this test can be used) for the duration of the COVID-19 declaration under Section 564(b)(1) of the Act, 21 U.S.C. section 360bbb-3(b)(1), unless the authorization is terminated or revoked.  Performed at [facility], [address], [place], [place] [address]   Ammonia     Status: None   Collection Time: 04/24/21 10:48 PM  Result Value Ref Range   Ammonia 19 9 - 35 umol/L    Comment: Performed at [facility], [address] [place], [place] [address]  TSH     Status: None   Collection Time: 04/24/21 10:48 PM  Result Value Ref Range   TSH 3.274 0.350 - 4.500 uIU/mL    Comment: Performed by a 3rd Generation assay with a functional sensitivity of <=0.01 uIU/mL. Performed at [facility], [address], [place], [place] [address]   Glucose, capillary     Status: Abnormal   Collection Time: 04/25/21 12:09 AM  Result Value Ref Range   Glucose-Capillary 388 (H) 70 - 99 mg/dL    Comment: Glucose reference range applies only to samples taken after fasting for  at least 8 hours.  Troponin I (High Sensitivity)     Status: None   Collection Time: 04/25/21  1:55 AM  Result Value Ref Range   Troponin I (High Sensitivity) 8 <18 ng/L    Comment: (NOTE) Elevated high sensitivity troponin I (hsTnI) values and significant  changes across serial measurements may suggest ACS but many other  chronic and acute conditions are known to elevate hsTnI results.  Refer to the "Links" section for chest pain algorithms and additional  guidance. Performed at [facility], [address], [place], [place] [address]   Comprehensive metabolic panel     Status: Abnormal   Collection Time: 04/25/21  1:55 AM  Result Value Ref Range   Sodium 134 (L) 135 - 145 mmol/L   Potassium 6.2 (H) 3.5 - 5.1 mmol/L   Chloride 105 98 - 111 mmol/L   CO2 20 (L) 22 - 32 mmol/L   Glucose, Bld 430 (H) 70 - 99 mg/dL    Comment: Glucose reference range applies only to samples taken after fasting for at least 8 hours.   BUN 82 (H) 8 - 23 mg/dL   Creatinine, Ser 4.31 (H) 0.44 - 1.00 mg/dL   Calcium 8.2 (L) 8.9 - 10.3 mg/dL   Total Protein 6.9 6.5 - 8.1 g/dL   Albumin 3.2 (L) 3.5 - 5.0 g/dL   AST 28 15 - 41 U/L   ALT 22 0 - 44 U/L   Alkaline Phosphatase 55 38 - 126 U/L   Total Bilirubin 0.2 (L) 0.3 - 1.2 mg/dL   GFR, Estimated 10 (L) >60 mL/min    Comment: (NOTE) Calculated using the CKD-EPI  Creatinine Equation (2021)    Anion gap 9 5 - 15    Comment: Performed at [facility], [address], [place], [place] [address]  Magnesium     Status: Abnormal   Collection Time: 04/25/21  1:55 AM  Result Value Ref Range   Magnesium 2.7 (H) 1.7 - 2.4 mg/dL    Comment: Performed at [facility], [address], [place], [place] [address]  TSH     Status: None   Collection Time: 04/25/21  1:55 AM  Result Value Ref Range   TSH 1.796 0.350 - 4.500 uIU/mL    Comment: Performed by a 3rd Generation assay with a functional sensitivity of <=0.01 uIU/mL. Performed at [facility], [address],  [place], [place] [address]   Blood gas, venous     Status: Abnormal   Collection Time: 04/25/21  1:55 AM  Result Value Ref Range   FIO2 28.00    pH, Ven 7.280 7.250 - 7.430   pCO2, Ven 47.8 44.0 - 60.0 mmHg   pO2, Ven 80.9 (H) 32.0 - 45.0 mmHg   Bicarbonate 20.6 20.0 - 28.0 mmol/L   Acid-base deficit 3.9 (H) 0.0 - 2.0 mmol/L   O2 Saturation 93.7 %   Patient temperature 36.8    Collection site BLOOD RIGHT HAND    Sample type VENOUS     Comment: Performed at [facility], [address], [place], [place] [address]  Troponin I (High Sensitivity)     Status: None   Collection Time: 04/25/21  4:16 AM  Result Value Ref Range   Troponin I (High Sensitivity) 9 <18 ng/L    Comment: (NOTE) Elevated high sensitivity troponin I (hsTnI) values and significant  changes across serial measurements may suggest ACS but many other  chronic and acute conditions are known to elevate hsTnI results.  Refer to the "Links" section for chest pain algorithms and additional  guidance. Performed at [facility], [address], [place], [place] [address]   Comprehensive metabolic panel     Status: Abnormal   Collection Time: 04/25/21  4:16 AM  Result Value Ref Range   Sodium 134 (L) 135 - 145 mmol/L   Potassium 5.8 (H) 3.5 - 5.1 mmol/L   Chloride 104 98 - 111 mmol/L   CO2 22 22 - 32 mmol/L   Glucose, Bld 339 (H) 70 - 99 mg/dL    Comment: Glucose reference range applies only to samples taken after fasting for at least 8 hours.   BUN 80 (H) 8 - 23 mg/dL   Creatinine, Ser 4.21 (H) 0.44 - 1.00 mg/dL   Calcium 8.0 (L) 8.9 - 10.3 mg/dL   Total Protein 6.7 6.5 - 8.1 g/dL   Albumin 3.0 (L) 3.5 - 5.0 g/dL   AST 25 15 - 41 U/L   ALT 21 0 - 44 U/L   Alkaline Phosphatase 54 38 - 126 U/L   Total Bilirubin 0.1 (L) 0.3 - 1.2 mg/dL   GFR, Estimated 10 (L) >60 mL/min    Comment: (NOTE) Calculated using the CKD-EPI Creatinine Equation (2021)    Anion gap 8 5 - 15    Comment: Performed at [facility], [address] [place], [place] [address]  Brain natriuretic peptide     Status: Abnormal   Collection Time: 04/25/21  4:16 AM  Result Value Ref Range   B Natriuretic Peptide 290.0 (H) 0.0 - 100.0 pg/mL    Comment: Performed at [facility], [address] [place], [place]  [address]  Glucose, capillary     Status: Abnormal   Collection Time: 04/25/21  5:47 AM  Result Value Ref Range   Glucose-Capillary 281 (H) 70 - 99 mg/dL    Comment: Glucose reference range applies only to samples taken after fasting for at least 8 hours.  Glucose, capillary     Status: Abnormal   Collection Time: 04/25/21  7:42 AM  Result Value Ref Range   Glucose-Capillary 215 (H) 70 - 99 mg/dL    Comment: Glucose reference range applies only to samples taken after fasting for at least 8 hours.     ROS:  A comprehensive review of systems was negative except for: Cardiovascular: positive for lower extremity edema Gastrointestinal: positive for abdominal pain  Physical Exam: Vitals:   04/25/21 0424 04/25/21 0905  BP: (!) 146/50 (!) 134/59  Pulse: (!) 47   Resp: 18   Temp: 98.5 F (36.9 C)   SpO2: 99%      General: Obese, somnolent but arousable.  No acute distress HEENT: Pupils are equal round reactive to light, extraocular motions are intact, mucous membranes moist Neck: Unable to determine JVD Heart: Slightly bradycardic Lungs: Poor effort, decreased breath sounds at bases-100% on 2 L nasal cannula Abdomen: Obese, she endorses some left sided discomfort.  No guarding no rebound Extremities: At least 1+ pitting edema bilaterally Skin: Warm and dry Neuro: Somnolent but arousable and appropriate  Assessment/Plan: 86 year old black female with diabetes, hypertension, history of CVA and also CKD with creatinine baseline between 1.5 and 2.  She presents with hypoglycemia, altered mental status and acute on chronic renal failure 1.Renal-  Baseline creatinine between 1.5 and 2, last known 1.58 on 02/28/2021.  Urinalysis is  without protein or cells.  Renal ultrasound is pending.  She seems nonoliguric with at least 250 of urine output recorded.  This all could be explained by Bactrim as Bactrim will cause some AKI.  Then, after she developed AKI she developed hyperkalemia due to being on potassium supplement, hypoglycemia secondary to decreased bodily clearance of insulin, and decreased mental status secondary to decreased clearance of gabapentin.  If renal ultrasound does not show any issue, I think all as needed is continued holding of Bactrim, potassium, Neurontin, and potassium and hopefully we will see resolution of her acute on chronic renal failure.  There are no indications for dialysis at present 2. Hypertension/volume  -blood pressure is not low.  She does not appear to be volume depleted.  I would not give her any fluids.  She is bradycardic and was reportedly on beta-blocker as outpatient.  This is being held.  I would not diurese at this time as does not appear to be needed and I would not want to stall any renal recovery 3.  Hyperglycemia-secondary to decreased bodily clearance of insulin.  Per primary 4.  Decreased mental status-I think secondary to high-dose Neurontin in the setting of AKI.  Neurontin is being held 5. Anemia  -she does have this with hemoglobin 8.5.  I will check iron stores and add ESA for hemoglobin support.  Trend and transfuse as needed 6.  Hyperkalemia-due to AKI and potassium supplementation as well as Bactrim.  Those meds are on hold and she has received Lokelma.  Seems to be trending a little better-continue Lokelma  Will be watching labs over the weekend and act as needed.  Will be physically seen again on Monday   Louis Meckel 04/25/2021, 9:25 AM

## 2021-04-25 NOTE — H&P (Addendum)
TRH H&P    Patient Demographics:    [name], is a 86 y.o. female  MRN: [id number]  DOB - [birth year]  Admit Date - 04/24/2021  Referring MD/NP/PA: Dina Rich  Outpatient Primary MD for the patient is [name], MD  Patient coming from: Home  Chief complaint- Altered/ hypoglycemia   HPI:    [name]  is a 86 y.o. female, with history of anemia, chronic diastolic heart failure, CKD, essential hypertension, GERD, brain hemorrhage, paroxysmal atrial fibrillation, type 2 diabetes mellitus, presents ED with chief complaint of hypoglycemia.  Unfortunately patient is altered, very somnolent, not able to provide very much history.  Patient reports that her glucose was abnormal.  She thinks it was low.  She does not remember being confused.  She is oriented x3, but does not remember the ride to the hospital, being in the hospital.  Very difficult to get to answer questions.  Patient has no other complaint at this time.  In the ED Temp 95.8, heart rate 49-58, respiratory 14-19, blood pressure 136/54, satting 98% No leukocytosis, hemoglobin 8.5, platelets 274, Bicarb is low at 21, hemoglobin 194 Albumin 3.3 Lactic acid 1.9 Negative respiratory panel UA is bacterial for a, Blood cultures pending, chest x-ray shows cardiomegaly with central pulmonary vascular congestion and left basilar atelectasis EKG shows 50 to accelerated junctional rhythm QTC 510 Admission requested for altered mental status    Review of systems:    Unfortunately review of systems cannot be obtained secondary to patient's altered mental status    Past History of the following :    Past Medical History:  Diagnosis Date   Anemia    Arthritis    Chronic diastolic heart failure (HCC)    CKD (chronic kidney disease) stage 4, GFR 15-29 ml/min (HCC)    Essential hypertension    GERD (gastroesophageal reflux disease)    Hemorrhoids     History of stroke    Intraparenchymal hemorrhage of brain (HCC)    PAF (paroxysmal atrial fibrillation) (HCC)    Type 2 diabetes mellitus ([place])       Past Surgical History:  Procedure Laterality Date   BACK SURGERY     CATARACT EXTRACTION W/PHACO Left 02/28/2013   Procedure: CATARACT EXTRACTION PHACO AND INTRAOCULAR LENS PLACEMENT (IOL) CDE=15.60;  Surgeon: Elta Guadeloupe T. Gershon Crane, MD;  Location: [facility];  Service: Ophthalmology;  Laterality: Left;   CHOLECYSTECTOMY     COLONOSCOPY  12/12/2003   RMR: Normal rectum.  Long capacious tortuous colon, colonic mucosa appeared normal   COLONOSCOPY N/A 03/21/2014   Dr. [name]: internal and external hemorrhoids, torturous colon, colonic diverticulosis    ESOPHAGOGASTRODUODENOSCOPY  12/28/2001   DDU:KGURK sliding hiatal hernia/. Three small bulbar ulcers, two with stigmata of bleeding and these were coagulated using heater probe unit    ESOPHAGOGASTRODUODENOSCOPY N/A 03/21/2014   Dr. [name]: cervical esophageal web s/p dilation, negative H.pylori    EYE SURGERY     HIP FRACTURE SURGERY  2008   JOINT REPLACEMENT     Rt hip, ????? sounds like just for  fracture   MALONEY DILATION N/A 03/21/2014   Procedure: [name] DILATION;  Surgeon: [name], MD;  Location: [facility];  Service: Endoscopy;  Laterality: N/A;   SAVORY DILATION N/A 03/21/2014   Procedure: SAVORY DILATION;  Surgeon: [name], MD;  Location: [facility];  Service: Endoscopy;  Laterality: N/A;      Social History:      Social History   Tobacco Use   Smoking status: Never   Smokeless tobacco: Never  Substance Use Topics   Alcohol use: No       Family History :     Family History  Problem Relation Age of Onset   Crohn's disease Daughter    Hypertension Mother    Hypertension Sister    Diabetes Brother    Diabetes Sister    Diabetes Sister    Heart attack Sister    Colon cancer Neg Hx       Home Medications:   Prior to Admission medications   Medication  Sig Start Date End Date Taking? Authorizing Provider  acetaminophen (TYLENOL) 325 MG tablet Take 2 tablets (650 mg total) by mouth every 6 (six) hours as needed for mild pain, fever or headache (or Fever >/= 101). 07/31/19  Yes Emokpae, Courage, MD  albuterol (VENTOLIN HFA) 108 (90 Base) MCG/ACT inhaler Inhale 2 puffs into the lungs every 6 (six) hours as needed for wheezing or shortness of breath.   Yes [provider]  amLODipine (NORVASC) 10 MG tablet Take 1 tablet (10 mg total) by mouth daily. 09/29/20  Yes Patrecia Pour, MD  carvedilol (COREG) 12.5 MG tablet Take 1 tablet (12.5 mg total) by mouth 2 (two) times daily with a meal. 02/28/21 04/24/21 Yes Shahmehdi, Seyed A, MD  DULoxetine (CYMBALTA) 30 MG capsule Take 1 capsule (30 mg total) by mouth daily. 10/11/20  Yes Angiulli, Lavon Paganini, PA-C  famotidine (PEPCID) 20 MG tablet Take 20 mg by mouth daily. 01/29/21  Yes [provider]  gabapentin (NEURONTIN) 300 MG capsule Take 300 mg by mouth 3 (three) times daily.   Yes [provider]  hydrALAZINE (APRESOLINE) 100 MG tablet Take 1 tablet (100 mg total) by mouth 3 (three) times daily. 09/30/20  Yes Patrecia Pour, MD  hydrocortisone (ANUSOL-HC) 2.5 % rectal cream Place 1 application rectally 2 (two) times daily as needed for hemorrhoids or anal itching. 01/03/20  Yes Barton Dubois, MD  insulin aspart (NOVOLOG) 100 UNIT/ML FlexPen Inject 0-10 Units into the skin 3 (three) times daily with meals. insulin aspart (novoLOG) injection 0-10 Units 0-10 Units Subcutaneous, 3 times daily with meals CBG < 70: Implement Hypoglycemia Standing Orders and refer to Hypoglycemia Standing Orders sidebar report  CBG 70 - 120: 0 unit CBG 121 - 150: 0 unit  CBG 151 - 200: 1 unit CBG 201 - 250: 2 units CBG 251 - 300: 4 units CBG 301 - 350: 6 units  CBG 351 - 400: 8 units  CBG > 400: 10 units 07/31/19  Yes Emokpae, Courage, MD  insulin detemir (LEVEMIR) 100 UNIT/ML injection Inject 0.22 mLs (22 Units  total) into the skin 2 (two) times daily. 10/16/20  Yes Dessa Phi, DO  pantoprazole (PROTONIX) 40 MG tablet Take 1 tablet by mouth daily. 09/06/20  Yes [provider]  potassium chloride (KLOR-CON) 10 MEQ tablet Take 10 mEq by mouth 2 (two) times daily. 03/29/21  Yes [provider]  simvastatin (ZOCOR) 20 MG tablet Take 20 mg by mouth  at bedtime. 01/29/21  Yes [provider]  torsemide (DEMADEX) 20 MG tablet Take 1 tablet (20 mg total) by mouth 2 (two) times daily. 02/28/21 04/24/21 Yes Shahmehdi, Valeria Batman, MD  ELIQUIS 2.5 MG TABS tablet Take 2.5 mg by mouth 2 (two) times daily. Patient not taking: Reported on 04/24/2021 01/29/21   [provider]  potassium chloride (KLOR-CON) 10 MEQ tablet Take 1 tablet (10 mEq total) by mouth 2 (two) times daily. 02/28/21 04/22/21  Deatra James, MD  sulfamethoxazole-trimethoprim (BACTRIM DS) 800-160 MG tablet SMARTSIG:1 Tablet(s) By Mouth Every 12 Hours Patient not taking: Reported on 04/24/2021 04/17/21   [provider]     Allergies:    No Known Allergies   Physical Exam:   Vitals  Blood pressure (!) 146/50, pulse (!) 47, temperature 98.5 F (36.9 C), resp. rate 18, height 5\' 4"  (1.626 m), weight 97.1 kg, SpO2 99 %.   1.  General: Patient lying supine in bed,  no acute distress   2. Psychiatric: Somnolent and confused but still oriented x3, mood and behavior normal for situation, pleasant and cooperative with exam   3. Neurologic: Speech and language are normal, face is symmetric, moves all 4 extremities voluntarily, at baseline without acute deficits on limited exam   4. HEENMT:  Head is atraumatic, normocephalic, pupils reactive to light, neck is supple, trachea is midline, mucous membranes are moist   5. Respiratory : Wheezing on exam bilaterally without cyanosis, rales, no increase in work of breathing or accessory muscle use   6. Cardiovascular : Heart rate normal, rhythm is regular,  no murmurs, rubs or gallops, pitting edema and venous stasis changes present, peripheral pulses palpated   7. Gastrointestinal:  Abdomen is soft, nondistended, nontender to palpation bowel sounds active, no masses or organomegaly palpated   8. Skin:  Skin is warm, dry and intact without rashes, acute lesions, or ulcers on limited exam   9.Musculoskeletal:  No acute deformities or trauma, no asymmetry in tone, pitting edema present, peripheral pulses palpated, no tenderness to palpation in the extremities     Data Review:    CBC Recent Labs  Lab 04/24/21 1815  WBC 6.8  HGB 8.5*  HCT 27.5*  PLT 274  MCV 92.3  MCH 28.5  MCHC 30.9  RDW 15.5  LYMPHSABS 1.3  MONOABS 0.5  EOSABS 0.2  BASOSABS 0.0   ------------------------------------------------------------------------------------------------------------------  Results for orders placed or performed during the hospital encounter of 04/24/21 (from the past 48 hour(s))  CBG monitoring, ED     Status: Abnormal   Collection Time: 04/24/21  5:41 PM  Result Value Ref Range   Glucose-Capillary 143 (H) 70 - 99 mg/dL    Comment: Glucose reference range applies only to samples taken after fasting for at least 8 hours.  CBC with Differential     Status: Abnormal   Collection Time: 04/24/21  6:15 PM  Result Value Ref Range   WBC 6.8 4.0 - 10.5 K/uL   RBC 2.98 (L) 3.87 - 5.11 MIL/uL   Hemoglobin 8.5 (L) 12.0 - 15.0 g/dL   HCT 27.5 (L) 36.0 - 46.0 %   MCV 92.3 80.0 - 100.0 fL   MCH 28.5 26.0 - 34.0 pg   MCHC 30.9 30.0 - 36.0 g/dL   RDW 15.5 11.5 - 15.5 %   Platelets 274 150 - 400 K/uL   nRBC 0.0 0.0 - 0.2 %   Neutrophils Relative % 72 %   Neutro Abs 4.9 1.7 -  7.7 K/uL   Lymphocytes Relative 18 %   Lymphs Abs 1.3 0.7 - 4.0 K/uL   Monocytes Relative 7 %   Monocytes Absolute 0.5 0.1 - 1.0 K/uL   Eosinophils Relative 2 %   Eosinophils Absolute 0.2 0.0 - 0.5 K/uL   Basophils Relative 1 %   Basophils Absolute 0.0 0.0 - 0.1 K/uL    Immature Granulocytes 0 %   Abs Immature Granulocytes 0.03 0.00 - 0.07 K/uL    Comment: Performed at [facility], [address], [place], [place] [address]  Comprehensive metabolic panel     Status: Abnormal   Collection Time: 04/24/21  6:15 PM  Result Value Ref Range   Sodium 138 135 - 145 mmol/L   Potassium 5.2 (H) 3.5 - 5.1 mmol/L   Chloride 107 98 - 111 mmol/L   CO2 21 (L) 22 - 32 mmol/L   Glucose, Bld 194 (H) 70 - 99 mg/dL    Comment: Glucose reference range applies only to samples taken after fasting for at least 8 hours.   BUN 82 (H) 8 - 23 mg/dL   Creatinine, Ser 4.12 (H) 0.44 - 1.00 mg/dL   Calcium 8.5 (L) 8.9 - 10.3 mg/dL   Total Protein 7.4 6.5 - 8.1 g/dL   Albumin 3.3 (L) 3.5 - 5.0 g/dL   AST 17 15 - 41 U/L   ALT 14 0 - 44 U/L   Alkaline Phosphatase 53 38 - 126 U/L   Total Bilirubin 0.4 0.3 - 1.2 mg/dL   GFR, Estimated 10 (L) >60 mL/min    Comment: (NOTE) Calculated using the CKD-EPI Creatinine Equation (2021)    Anion gap 10 5 - 15    Comment: Performed at [facility], [address] [place], [place] [address]  Lactic acid, plasma     Status: None   Collection Time: 04/24/21  6:15 PM  Result Value Ref Range   Lactic Acid, Venous 1.9 0.5 - 1.9 mmol/L    Comment: Performed at [facility], [address] [place], [place] [address]  Culture, blood (routine x 2)     Status: None (Preliminary result)   Collection Time: 04/24/21  6:15 PM   Specimen: BLOOD LEFT HAND  Result Value Ref Range   Specimen Description BLOOD LEFT HAND    Special Requests      BOTTLES DRAWN AEROBIC AND ANAEROBIC Blood Culture adequate volume Performed at [facility], [address] [place], [place] [address]    Culture PENDING    Report Status PENDING   Culture, blood (routine x 2)     Status: None (Preliminary result)   Collection Time: 04/24/21  6:15 PM   Specimen: BLOOD RIGHT HAND  Result Value Ref Range   Specimen Description BLOOD RIGHT HAND    Special Requests      BOTTLES  DRAWN AEROBIC AND ANAEROBIC Blood Culture adequate volume Performed at [facility], [address] [place], [place] [address]    Culture PENDING    Report Status PENDING   Magnesium     Status: Abnormal   Collection Time: 04/24/21  6:15 PM  Result Value Ref Range   Magnesium 2.7 (H) 1.7 - 2.4 mg/dL    Comment: Performed at [facility], [address] [place],  [address]  Urinalysis, Routine w reflex microscopic Urine, Clean Catch     Status: Abnormal   Collection Time: 04/24/21  8:05 PM  Result Value Ref Range   Color, Urine YELLOW YELLOW   APPearance CLEAR  CLEAR   Specific Gravity, Urine 1.015 1.005 - 1.030   pH 5.5 5.0 - 8.0   Glucose, UA NEGATIVE NEGATIVE mg/dL   Hgb urine dipstick NEGATIVE NEGATIVE   Bilirubin Urine NEGATIVE NEGATIVE   Ketones, ur NEGATIVE NEGATIVE mg/dL   Protein, ur NEGATIVE NEGATIVE mg/dL   Nitrite NEGATIVE NEGATIVE   Leukocytes,Ua SMALL (A) NEGATIVE    Comment: Performed at [facility], [address] [place], [place] [address]  Urinalysis, Microscopic (reflex)     Status: Abnormal   Collection Time: 04/24/21  8:05 PM  Result Value Ref Range   RBC / HPF NONE SEEN 0 - 5 RBC/hpf   WBC, UA 0-5 0 - 5 WBC/hpf   Bacteria, UA RARE (A) NONE SEEN   Squamous Epithelial / LPF 0-5 0 - 5    Comment: Performed at [facility], [address], [place], [place] [address]  Resp Panel by RT-PCR (Flu A&B, Covid) Nasopharyngeal Swab     Status: None   Collection Time: 04/24/21  8:14 PM   Specimen: Nasopharyngeal Swab; Nasopharyngeal(NP) swabs in vial transport medium  Result Value Ref Range   SARS Coronavirus 2 by RT PCR NEGATIVE NEGATIVE    Comment: (NOTE) SARS-CoV-2 target nucleic acids are NOT DETECTED.  The SARS-CoV-2 RNA is generally detectable in upper respiratory specimens during the acute phase of infection. The lowest concentration of SARS-CoV-2 viral copies this assay can detect is 138 copies/mL. A negative result does not preclude  SARS-Cov-2 infection and should not be used as the sole basis for treatment or other patient management decisions. A negative result may occur with  improper specimen collection/handling, submission of specimen other than nasopharyngeal swab, presence of viral mutation(s) within the areas targeted by this assay, and inadequate number of viral copies(<138 copies/mL). A negative result must be combined with clinical observations, patient history, and epidemiological information. The expected result is Negative.  Fact Sheet for Patients:  EntrepreneurPulse.com.au  Fact Sheet for Healthcare Providers:  IncredibleEmployment.be  This test is no t yet approved or cleared by the [place] FDA and  has been authorized for detection and/or diagnosis of SARS-CoV-2 by FDA under an Emergency Use Authorization (EUA). This EUA will remain  in effect (meaning this test can be used) for the duration of the COVID-19 declaration under Section 564(b)(1) of the Act, 21 U.S.C.section 360bbb-3(b)(1), unless the authorization is terminated  or revoked sooner.       Influenza A by PCR NEGATIVE NEGATIVE   Influenza B by PCR NEGATIVE NEGATIVE    Comment: (NOTE) The Xpert Xpress SARS-CoV-2/FLU/RSV plus assay is intended as an aid in the diagnosis of influenza from Nasopharyngeal swab specimens and should not be used as a sole basis for treatment. Nasal washings and aspirates are unacceptable for Xpert Xpress SARS-CoV-2/FLU/RSV testing.  Fact Sheet for Patients: EntrepreneurPulse.com.au  Fact Sheet for Healthcare Providers: IncredibleEmployment.be  This test is not yet approved or cleared by the [place] FDA and has been authorized for detection and/or diagnosis of SARS-CoV-2 by FDA under an Emergency Use Authorization (EUA). This EUA will remain in effect (meaning this test can be used) for the duration of the COVID-19  declaration under Section 564(b)(1) of the Act, 21 U.S.C. section 360bbb-3(b)(1), unless the authorization is terminated or revoked.  Performed at [facility], [address], [place],  [address]   Ammonia     Status: None   Collection Time: 04/24/21 10:48 PM  Result Value Ref Range   Ammonia 19 9 -  35 umol/L    Comment: Performed at [facility], [address], [place], [place] [address]  TSH     Status: None   Collection Time: 04/24/21 10:48 PM  Result Value Ref Range   TSH 3.274 0.350 - 4.500 uIU/mL    Comment: Performed by a 3rd Generation assay with a functional sensitivity of <=0.01 uIU/mL. Performed at [facility], [address], [place], [place] [address]   Glucose, capillary     Status: Abnormal   Collection Time: 04/25/21 12:09 AM  Result Value Ref Range   Glucose-Capillary 388 (H) 70 - 99 mg/dL    Comment: Glucose reference range applies only to samples taken after fasting for at least 8 hours.  Troponin I (High Sensitivity)     Status: None   Collection Time: 04/25/21  1:55 AM  Result Value Ref Range   Troponin I (High Sensitivity) 8 <18 ng/L    Comment: (NOTE) Elevated high sensitivity troponin I (hsTnI) values and significant  changes across serial measurements may suggest ACS but many other  chronic and acute conditions are known to elevate hsTnI results.  Refer to the "Links" section for chest pain algorithms and additional  guidance. Performed at [facility], [address] [place], [place] [address]   Comprehensive metabolic panel     Status: Abnormal   Collection Time: 04/25/21  1:55 AM  Result Value Ref Range   Sodium 134 (L) 135 - 145 mmol/L   Potassium 6.2 (H) 3.5 - 5.1 mmol/L   Chloride 105 98 - 111 mmol/L   CO2 20 (L) 22 - 32 mmol/L   Glucose, Bld 430 (H) 70 - 99 mg/dL    Comment: Glucose reference range applies only to samples taken after fasting for at least 8 hours.   BUN 82 (H) 8 - 23 mg/dL   Creatinine, Ser 4.31 (H) 0.44 - 1.00 mg/dL    Calcium 8.2 (L) 8.9 - 10.3 mg/dL   Total Protein 6.9 6.5 - 8.1 g/dL   Albumin 3.2 (L) 3.5 - 5.0 g/dL   AST 28 15 - 41 U/L   ALT 22 0 - 44 U/L   Alkaline Phosphatase 55 38 - 126 U/L   Total Bilirubin 0.2 (L) 0.3 - 1.2 mg/dL   GFR, Estimated 10 (L) >60 mL/min    Comment: (NOTE) Calculated using the CKD-EPI Creatinine Equation (2021)    Anion gap 9 5 - 15    Comment: Performed at [facility], [address] [place], [place] [address]  Magnesium     Status: Abnormal   Collection Time: 04/25/21  1:55 AM  Result Value Ref Range   Magnesium 2.7 (H) 1.7 - 2.4 mg/dL    Comment: Performed at [facility], [address], [place], [place] [address]  TSH     Status: None   Collection Time: 04/25/21  1:55 AM  Result Value Ref Range   TSH 1.796 0.350 - 4.500 uIU/mL    Comment: Performed by a 3rd Generation assay with a functional sensitivity of <=0.01 uIU/mL. Performed at [facility], [address] [place], [place] [address]   Blood gas, venous     Status: Abnormal   Collection Time: 04/25/21  1:55 AM  Result Value Ref Range   FIO2 28.00    pH, Ven 7.280 7.250 - 7.430   pCO2, Ven 47.8 44.0 - 60.0 mmHg   pO2, Ven 80.9 (H) 32.0 - 45.0 mmHg   Bicarbonate 20.6 20.0 - 28.0 mmol/L   Acid-base  deficit 3.9 (H) 0.0 - 2.0 mmol/L   O2 Saturation 93.7 %   Patient temperature 36.8    Collection site BLOOD RIGHT HAND    Sample type VENOUS     Comment: Performed at [facility], [address], [place], [place] [address]    Chemistries  Recent Labs  Lab 04/24/21 1815 04/25/21 0155  NA 138 134*  K 5.2* 6.2*  CL 107 105  CO2 21* 20*  GLUCOSE 194* 430*  BUN 82* 82*  CREATININE 4.12* 4.31*  CALCIUM 8.5* 8.2*  MG 2.7* 2.7*  AST 17 28  ALT 14 22  ALKPHOS 53 55  BILITOT 0.4 0.2*    ------------------------------------------------------------------------------------------------------------------  ------------------------------------------------------------------------------------------------------------------ GFR: Estimated Creatinine Clearance: 11 mL/min (A) (by C-G formula based on SCr of 4.31 mg/dL (H)). Liver Function Tests: Recent Labs  Lab 04/24/21 1815 04/25/21 0155  AST 17 28  ALT 14 22  ALKPHOS 53 55  BILITOT 0.4 0.2*  PROT 7.4 6.9  ALBUMIN 3.3* 3.2*   No results for input(s): LIPASE, AMYLASE in the last 168 hours. Recent Labs  Lab 04/24/21 2248  AMMONIA 19   Coagulation Profile: No results for input(s): INR, PROTIME in the last 168 hours. Cardiac Enzymes: No results for input(s): CKTOTAL, CKMB, CKMBINDEX, TROPONINI in the last 168 hours. BNP (last 3 results) No results for input(s): PROBNP in the last 8760 hours. HbA1C: No results for input(s): HGBA1C in the last 72 hours. CBG: Recent Labs  Lab 04/24/21 1741 04/25/21 0009  GLUCAP 143* 388*   Lipid Profile: No results for input(s): CHOL, HDL, LDLCALC, TRIG, CHOLHDL, LDLDIRECT in the last 72 hours. Thyroid Function Tests: Recent Labs    04/25/21 0155  TSH 1.796   Anemia Panel: No results for input(s): VITAMINB12, FOLATE, FERRITIN, TIBC, IRON, RETICCTPCT in the last 72 hours.  --------------------------------------------------------------------------------------------------------------- Urine analysis:    Component Value Date/Time   COLORURINE YELLOW 04/24/2021 2005   APPEARANCEUR CLEAR 04/24/2021 2005   LABSPEC 1.015 04/24/2021 2005   PHURINE 5.5 04/24/2021 2005   GLUCOSEU NEGATIVE 04/24/2021 2005   HGBUR NEGATIVE 04/24/2021 2005   BILIRUBINUR NEGATIVE 04/24/2021 2005   [place] 04/24/2021 2005   PROTEINUR NEGATIVE 04/24/2021 2005   UROBILINOGEN 0.2 02/20/2015 1200   NITRITE NEGATIVE 04/24/2021 2005   LEUKOCYTESUR SMALL (A) 04/24/2021 2005      Imaging  Results:    DG Chest Port 1 View  Result Date: 04/24/2021 CLINICAL DATA:  Shortness of breath. EXAM: PORTABLE CHEST 1 VIEW COMPARISON:  Chest x-ray 02/24/2021.  CT chest 07/23/2019. FINDINGS: The heart is enlarged. There central pulmonary vascular congestion. There is minimal left base atelectasis. Costophrenic angles are clear. There is no pneumothorax or acute fracture. IMPRESSION: 1. Cardiomegaly with central pulmonary vascular congestion. 2. Left basilar atelectasis. Electronically Signed   By: Ronney Asters M.D.   On: 04/24/2021 21:20       Assessment & Plan:    Principal Problem:   Acute metabolic encephalopathy Active Problems:   AKI (acute kidney injury) ([place])   Hypothermia   Mild protein-calorie malnutrition (HCC)   Acute metabolic encephalopathy BUN elevated 82, likely could be contributing Hypoglycemia down to 40s could also be contributing UA is not indicative of UTI, urine culture pending Chest x-ray shows central pulmonary vascular congestion Initially there was a concern for dehydration with a BUN to creatinine ratio 20:1, Lasix were given and AKI became worse indicated as well likely of cardiorenal syndrome Check a CT head Continue to monitor AKI Creatinine increased 1.58-4.12  Recent bicarb 21 Increase in GFR 10 Renal ultrasound Initially thought to be related to dehydration, but after fluids creatinine became worse Is here for cardiorenal syndrome, get echo in the a.m. Continue to monitor Hyperkalemia and hypermagnesemia Likely secondary to AKI Insulin and Lasix given for AKI Mild protein calorie malnutrition Encourage nutrient dense food choices Acute respiratory failure with hypoxia Requiring 3 L nasal cannula With vascular congestion on chest x-ray Possible CHF exacerbation, no history of CHF Echo in the a.m. Monitor on telemetry Hypothermia Patient was initially cool at 95.8 Warming blanket was applied and patient has been weaned off warming like  it Diabetes mellitus type 2 Sliding scale coverage   DVT Prophylaxis-   Heparin - SCDs   AM Labs Ordered, also please review Full Orders  Family Communication: Admission, patients condition and plan of care including tests being ordered have been discussed with the patient and who indicate understanding and agree with the plan and Code S. Soo family at bedside atus.  Code Status: DNR  Admission status: Observation    * I certify that at the point of admission it is my clinical judgment that the patient will require inpatient hospital care spanning beyond 2 midnights from the point of admission due to high intensity of service, high risk for further deterioration and high frequency of surveillance required.*  Disposition: Anticipated Discharge date 48 hours discharge to home  Time spent in minutes : [place] DO

## 2021-04-25 NOTE — Care Management Obs Status (Signed)
[place] NOTIFICATION   Patient Details  Name: BEAUTIFUL PENSYL MRN: [id number] Date of Birth: Nov 23, 1934   Medicare Observation Status Notification Given:  Yes    Tommy Medal 04/25/2021, 3:55 PM

## 2021-04-25 NOTE — Progress Notes (Addendum)
*  PRELIMINARY RESULTS* Echocardiogram 2D Echocardiogram has been performed with Definity.  Samuel Germany 04/25/2021, 2:03 PM

## 2021-04-25 NOTE — Progress Notes (Signed)
Patient had critical lab values earlier from [facility]. Notified MD of critical lab values.  Received orders from MD.  Orders carried out per MD order.

## 2021-04-25 NOTE — Progress Notes (Signed)
Inpatient Diabetes Program Recommendations  AACE/ADA: New Consensus Statement on Inpatient Glycemic Control   Target Ranges:  Prepandial:   less than 140 mg/dL      Peak postprandial:   less than 180 mg/dL (1-2 hours)      Critically ill patients:  140 - 180 mg/dL    Latest Reference Range & Units 04/25/21 00:09 04/25/21 04:28 04/25/21 05:47 04/25/21 07:42  Glucose-Capillary 70 - 99 mg/dL 388 (H)   Novolog 12 units  281 (H) 215 (H)    Latest Reference Range & Units 04/25/21 01:55 04/25/21 04:16  Glucose 70 - 99 mg/dL 430 (H) 339 (H)   Review of Glycemic Control  Diabetes history: DM2 Outpatient Diabetes medications: Levemir 22 units BID, Novolog 0-10 units TID with meals Current orders for Inpatient glycemic control: Levemir 15 units QHS, Novolog 0-15 units TID with meals, Novolog 0-5 units QHS  Inpatient Diabetes Program Recommendations:    Insulin: Please consider changing Levemir to 10 units BID to start now.  Thanks, Barnie Alderman, RN, MSN, CDE Diabetes Coordinator Inpatient Diabetes Program [phone] (Team Pager from 8am to 5pm)

## 2021-04-25 NOTE — Progress Notes (Signed)
PROGRESS NOTE    Patient: [name]                            PCP: [name], MD                    DOB: [birth year]            DOA: 04/24/2021 [id number]             DOS: 04/25/2021, 10:53 AM   LOS: 0 days   Date of Service: The patient was seen and examined on 04/25/2021  Subjective:   The patient was seen and examined this morning. Stable at this time. Much more awake alert, hemodynamically stable, stating she got admitted as she could not bring her blood sugar up.  Denies any recent illnesses, denies any fever, chills, nausea vomiting constipation or diarrhea.  Reports that she is basically bedbound unable to ambulate on her own, she has a previous history of stroke. Lives at home-dependent for all mobility ADLs  Brief Narrative:   [name]  is a 86 y.o. female, with history of anemia, chronic diastolic heart failure, CKD, essential hypertension, GERD, brain hemorrhage, paroxysmal atrial fibrillation, type 2 diabetes mellitus, presents ED with chief complaint of hypoglycemia, and confusion.  ED: Hemodynamically stable,No leukocytosis, hemoglobin 8.5, platelets 274, Bicarb is low at 21, hemoglobin 194, Albumin 3.3, Lactic acid 1.9 Negative respiratory panel UA : Rare bacteria WBC 0-5, small leukocytosis Blood cultures pending, chest x-ray shows cardiomegaly with central pulmonary vascular congestion and left basilar atelectasis EKG shows 50 to accelerated junctional rhythm QTC 510   Assessment & Plan:   Principal Problem:   Acute metabolic encephalopathy Active Problems:   AKI (acute kidney injury) ([place])   Hypothermia   Mild protein-calorie malnutrition (HCC)    Acute metabolic encephalopathy -Likely due to hypoglycemia, medication such as high-dose Neurontin, uremia, -Blood sugars recorded as low as 40s -Ruled out infection, SIRS, sepsis, UA negative, chest x-ray clear, mild vascular congestion, -Blood sugars improved, mentation has returned to  baseline -Head CT revealing chronic changes, negative for any acute abnormality -Continue monitoring closely  Acute on chronic renal disease IIa -BUN 80/creatinine 4.21 >>>  -GFR 10 -Baseline creatinine one-point 5-2, 1.58 on 02/28/2021 -Urinalysis consistent with proteinuria -Renal ultrasound pending -Nephrologist consulted   Hypertension -Currently stable, continue current medication with exception of diuretics   Euvolemic -No signs of volume overload, with holding Lasix today  Uncontrolled diabetes mellitus type 2, hypoglycemia  -Hypoglycemia has resolved, currently hyperglycemic -Initiating CBG q. ACH S, with SSI coverage -Likely secondary reduced insulin clearance  Electrolytes: Hyperkalemia, hypermagnesemia -Likely due to AKI, home medication including Bactrim, monitoring closely, -Status post treatment with Lokelma,  -Monitoring closely   Acute on chronic respiratory failure with hypoxia -Oxygen dependent at home 2-3 L, currently on 3 L of oxygen, satting 99% -No signs of respiratory distress -No history of COPD or CHF -Chest x-ray reviewed, mild congestion, no significant lower extremity edema -No signs of volume overload -Monitoring closely  Transient hypothermia -Likely due to hypoglycemia -Temp improved 98.5 currently  Severe debility/bedbound at baseline with history of CVA -PT OT consult for evaluation -Patient is from home likely would need home health on discharge    ----------------------------------------------------------------------------------------------------------------------------------------------- Nutritional status:  The patient's BMI is: Body mass index is 36.74 kg/m. I agree with the assessment and plan as outlined ---------------------------------------------------------------------------------------------------------------------------------------------- Cultures; Blood cultures  Antimicrobials: None  Consultants:  Nephrology   ------------------------------------------------------------------------------------------------------------------------------------------------  DVT prophylaxis:  SCD/Compression stockings Heparin Code Status:   Code Status: Full Code  Family Communication: No family member present at bedside- attempt will be made to update daily The above findings and plan of care has been discussed with patient (and family)  in detail,  they expressed understanding and agreement of above. -Advance care planning has been discussed.   Admission status:   Status is: Observation  The patient remains OBS appropriate and will d/c before 2 midnights.    Level of care: Telemetry   Procedures:   No admission procedures for hospital encounter.    Antimicrobials:  Anti-infectives (From admission, onward)    None        Medication:   amLODipine  10 mg Oral Daily   darbepoetin (ARANESP) injection - NON-DIALYSIS  150 mcg Subcutaneous Once   famotidine  20 mg Oral Daily   heparin injection (subcutaneous)  5,000 Units Subcutaneous Q8H   hydrALAZINE  100 mg Oral TID   insulin aspart  0-15 Units Subcutaneous TID WC   insulin aspart  0-5 Units Subcutaneous QHS   insulin detemir  15 Units Subcutaneous QHS   simvastatin  20 mg Oral QHS   sodium zirconium cyclosilicate  10 g Oral TID    acetaminophen **OR** acetaminophen, albuterol, ondansetron **OR** ondansetron (ZOFRAN) IV, oxyCODONE   Objective:   Vitals:   04/24/21 2230 04/24/21 2357 04/25/21 0424 04/25/21 0905  BP: (!) 142/49 (!) 156/55 (!) 146/50 (!) 134/59  Pulse: (!) 51 (!) 53 (!) 47   Resp: 19 19 18    Temp: 98.1 F (36.7 C) 98.3 F (36.8 C) 98.5 F (36.9 C)   TempSrc: Oral     SpO2: 100% 100% 99%   Weight:      Height:        Intake/Output Summary (Last 24 hours) at 04/25/2021 1053 Last data filed at 04/25/2021 0900 Gross per 24 hour  Intake 740 ml  Output 250 ml  Net 490 ml   Filed Weights   04/24/21  1748  Weight: 97.1 kg     Examination:   Physical Exam  Constitution: More awake alert this morning Psychiatric: Normal and stable mood and affect, cognition intact,   HEENT: Normocephalic, PERRL, otherwise with in Normal limits  Chest:Chest symmetric Cardio vascular:  S1/S2, RRR, No murmure, No Rubs or Gallops  pulmonary: Clear to auscultation bilaterally, respirations unlabored, negative wheezes / crackles Abdomen: Soft, non-tender, non-distended, bowel sounds,no masses, no organomegaly Muscular skeletal: Limited exam - in bed, minimal lower extremity movement in bed-bedbound, good strength in upper extremities Neuro: CNII-XII intact. ,  Significant reduced motor function in lower extremities,  sensation intact,  reflexes intact  Extremities: No pitting edema lower extremities, +2 pulses  Skin: Dry, warm to touch, negative for any Rashes, No open wounds Wounds: per nursing documentation    ------------------------------------------------------------------------------------------------------------------------------------------    LABs:  CBC Latest Ref Rng & Units 04/24/2021 02/26/2021 02/25/2021  WBC 4.0 - 10.5 K/uL 6.8 9.4 10.9(H)  Hemoglobin 12.0 - 15.0 g/dL 8.5(L) 9.0(L) 9.1(L)  Hematocrit 36.0 - 46.0 % 27.5(L) 29.5(L) 28.6(L)  Platelets 150 - 400 K/uL 274 221 241   CMP Latest Ref Rng & Units 04/25/2021 04/25/2021 04/24/2021  Glucose 70 - 99 mg/dL 339(H) 430(H) 194(H)  BUN 8 - 23 mg/dL 80(H) 82(H) 82(H)  Creatinine 0.44 - 1.00 mg/dL 4.21(H) 4.31(H) 4.12(H)  Sodium 135 - 145 mmol/L 134(L) 134(L) 138  Potassium 3.5 - 5.1 mmol/L 5.8(H) 6.2(H)  5.2(H)  Chloride 98 - 111 mmol/L 104 105 107  CO2 22 - 32 mmol/L 22 20(L) 21(L)  Calcium 8.9 - 10.3 mg/dL 8.0(L) 8.2(L) 8.5(L)  Total Protein 6.5 - 8.1 g/dL 6.7 6.9 7.4  Total Bilirubin 0.3 - 1.2 mg/dL 0.1(L) 0.2(L) 0.4  Alkaline Phos 38 - 126 U/L 54 55 53  AST 15 - 41 U/L 25 28 17   ALT 0 - 44 U/L 21 22 14        Micro  Results Recent Results (from the past 240 hour(s))  Culture, blood (routine x 2)     Status: None (Preliminary result)   Collection Time: 04/24/21  6:15 PM   Specimen: BLOOD LEFT HAND  Result Value Ref Range Status   Specimen Description BLOOD LEFT HAND  Final   Special Requests   Final    BOTTLES DRAWN AEROBIC AND ANAEROBIC Blood Culture adequate volume   Culture   Final    NO GROWTH < 24 HOURS Performed at [facility], [address] [place], [place] [address]    Report Status PENDING  Incomplete  Culture, blood (routine x 2)     Status: None (Preliminary result)   Collection Time: 04/24/21  6:15 PM   Specimen: BLOOD RIGHT HAND  Result Value Ref Range Status   Specimen Description BLOOD RIGHT HAND  Final   Special Requests   Final    BOTTLES DRAWN AEROBIC AND ANAEROBIC Blood Culture adequate volume   Culture   Final    NO GROWTH < 24 HOURS Performed at [facility], [address] [place], [place] [address]    Report Status PENDING  Incomplete  Resp Panel by RT-PCR (Flu A&B, Covid) Nasopharyngeal Swab     Status: None   Collection Time: 04/24/21  8:14 PM   Specimen: Nasopharyngeal Swab; Nasopharyngeal(NP) swabs in vial transport medium  Result Value Ref Range Status   SARS Coronavirus 2 by RT PCR NEGATIVE NEGATIVE Final    Comment: (NOTE) SARS-CoV-2 target nucleic acids are NOT DETECTED.  The SARS-CoV-2 RNA is generally detectable in upper respiratory specimens during the acute phase of infection. The lowest concentration of SARS-CoV-2 viral copies this assay can detect is 138 copies/mL. A negative result does not preclude SARS-Cov-2 infection and should not be used as the sole basis for treatment or other patient management decisions. A negative result may occur with  improper specimen collection/handling, submission of specimen other than nasopharyngeal swab, presence of viral mutation(s) within the areas targeted by this assay, and inadequate number of  viral copies(<138 copies/mL). A negative result must be combined with clinical observations, patient history, and epidemiological information. The expected result is Negative.  Fact Sheet for Patients:  EntrepreneurPulse.com.au  Fact Sheet for Healthcare Providers:  IncredibleEmployment.be  This test is no t yet approved or cleared by the [place] FDA and  has been authorized for detection and/or diagnosis of SARS-CoV-2 by FDA under an Emergency Use Authorization (EUA). This EUA will remain  in effect (meaning this test can be used) for the duration of the COVID-19 declaration under Section 564(b)(1) of the Act, 21 U.S.C.section 360bbb-3(b)(1), unless the authorization is terminated  or revoked sooner.       Influenza A by PCR NEGATIVE NEGATIVE Final   Influenza B by PCR NEGATIVE NEGATIVE Final    Comment: (NOTE) The Xpert Xpress SARS-CoV-2/FLU/RSV plus assay is intended as an aid in the diagnosis of influenza from Nasopharyngeal swab specimens and should not be used as a sole basis for  treatment. Nasal washings and aspirates are unacceptable for Xpert Xpress SARS-CoV-2/FLU/RSV testing.  Fact Sheet for Patients: EntrepreneurPulse.com.au  Fact Sheet for Healthcare Providers: IncredibleEmployment.be  This test is not yet approved or cleared by the [place] FDA and has been authorized for detection and/or diagnosis of SARS-CoV-2 by FDA under an Emergency Use Authorization (EUA). This EUA will remain in effect (meaning this test can be used) for the duration of the COVID-19 declaration under Section 564(b)(1) of the Act, 21 U.S.C. section 360bbb-3(b)(1), unless the authorization is terminated or revoked.  Performed at [facility], [address], [place], [place] [address]     Radiology Reports DG Chest [place] 1 View  Result Date: 04/24/2021 CLINICAL DATA:  Shortness of breath. EXAM: PORTABLE  CHEST 1 VIEW COMPARISON:  Chest x-ray 02/24/2021.  CT chest 07/23/2019. FINDINGS: The heart is enlarged. There central pulmonary vascular congestion. There is minimal left base atelectasis. Costophrenic angles are clear. There is no pneumothorax or acute fracture. IMPRESSION: 1. Cardiomegaly with central pulmonary vascular congestion. 2. Left basilar atelectasis. Electronically Signed   By: Ronney Asters M.D.   On: 04/24/2021 21:20    SIGNED: Deatra James, MD, FHM. [facility],  Pager (please use [email or web address] to page/text) Please use Epic Secure Chat for non-urgent communication (7AM-7PM)  If 7PM-7AM, please contact night-coverage [email or web address], 04/25/2021, 10:53 AM

## 2021-04-26 DIAGNOSIS — T368X5A Adverse effect of other systemic antibiotics, initial encounter: Secondary | ICD-10-CM | POA: Diagnosis present

## 2021-04-26 DIAGNOSIS — Z6836 Body mass index (BMI) 36.0-36.9, adult: Secondary | ICD-10-CM | POA: Diagnosis not present

## 2021-04-26 DIAGNOSIS — E669 Obesity, unspecified: Secondary | ICD-10-CM | POA: Diagnosis present

## 2021-04-26 DIAGNOSIS — I5032 Chronic diastolic (congestive) heart failure: Secondary | ICD-10-CM | POA: Diagnosis present

## 2021-04-26 DIAGNOSIS — G9341 Metabolic encephalopathy: Secondary | ICD-10-CM | POA: Diagnosis not present

## 2021-04-26 DIAGNOSIS — N184 Chronic kidney disease, stage 4 (severe): Secondary | ICD-10-CM | POA: Diagnosis present

## 2021-04-26 DIAGNOSIS — I130 Hypertensive heart and chronic kidney disease with heart failure and stage 1 through stage 4 chronic kidney disease, or unspecified chronic kidney disease: Secondary | ICD-10-CM | POA: Diagnosis present

## 2021-04-26 DIAGNOSIS — N390 Urinary tract infection, site not specified: Secondary | ICD-10-CM | POA: Diagnosis present

## 2021-04-26 DIAGNOSIS — Z8616 Personal history of COVID-19: Secondary | ICD-10-CM | POA: Diagnosis not present

## 2021-04-26 DIAGNOSIS — E441 Mild protein-calorie malnutrition: Secondary | ICD-10-CM | POA: Diagnosis present

## 2021-04-26 DIAGNOSIS — T426X5A Adverse effect of other antiepileptic and sedative-hypnotic drugs, initial encounter: Secondary | ICD-10-CM | POA: Diagnosis present

## 2021-04-26 DIAGNOSIS — J9621 Acute and chronic respiratory failure with hypoxia: Secondary | ICD-10-CM | POA: Diagnosis present

## 2021-04-26 DIAGNOSIS — D631 Anemia in chronic kidney disease: Secondary | ICD-10-CM | POA: Diagnosis present

## 2021-04-26 DIAGNOSIS — Z7401 Bed confinement status: Secondary | ICD-10-CM | POA: Diagnosis not present

## 2021-04-26 DIAGNOSIS — E875 Hyperkalemia: Secondary | ICD-10-CM | POA: Diagnosis present

## 2021-04-26 DIAGNOSIS — E11649 Type 2 diabetes mellitus with hypoglycemia without coma: Secondary | ICD-10-CM | POA: Diagnosis present

## 2021-04-26 DIAGNOSIS — I482 Chronic atrial fibrillation, unspecified: Secondary | ICD-10-CM | POA: Diagnosis present

## 2021-04-26 DIAGNOSIS — Z66 Do not resuscitate: Secondary | ICD-10-CM | POA: Diagnosis present

## 2021-04-26 DIAGNOSIS — Z20822 Contact with and (suspected) exposure to covid-19: Secondary | ICD-10-CM | POA: Diagnosis present

## 2021-04-26 DIAGNOSIS — E1122 Type 2 diabetes mellitus with diabetic chronic kidney disease: Secondary | ICD-10-CM | POA: Diagnosis present

## 2021-04-26 DIAGNOSIS — K5900 Constipation, unspecified: Secondary | ICD-10-CM | POA: Diagnosis not present

## 2021-04-26 DIAGNOSIS — N179 Acute kidney failure, unspecified: Secondary | ICD-10-CM | POA: Diagnosis present

## 2021-04-26 DIAGNOSIS — J9811 Atelectasis: Secondary | ICD-10-CM | POA: Diagnosis present

## 2021-04-26 LAB — URINE CULTURE: Culture: NO GROWTH

## 2021-04-26 LAB — GLUCOSE, CAPILLARY
Glucose-Capillary: 160 mg/dL — ABNORMAL HIGH (ref 70–99)
Glucose-Capillary: 181 mg/dL — ABNORMAL HIGH (ref 70–99)
Glucose-Capillary: 191 mg/dL — ABNORMAL HIGH (ref 70–99)
Glucose-Capillary: 196 mg/dL — ABNORMAL HIGH (ref 70–99)

## 2021-04-26 LAB — RENAL FUNCTION PANEL
Albumin: 3.1 g/dL — ABNORMAL LOW (ref 3.5–5.0)
Anion gap: 7 (ref 5–15)
BUN: 69 mg/dL — ABNORMAL HIGH (ref 8–23)
CO2: 23 mmol/L (ref 22–32)
Calcium: 8.2 mg/dL — ABNORMAL LOW (ref 8.9–10.3)
Chloride: 107 mmol/L (ref 98–111)
Creatinine, Ser: 3.5 mg/dL — ABNORMAL HIGH (ref 0.44–1.00)
GFR, Estimated: 12 mL/min — ABNORMAL LOW (ref >60)
Glucose, Bld: 159 mg/dL — ABNORMAL HIGH (ref 70–99)
Phosphorus: 4.1 mg/dL (ref 2.5–4.6)
Potassium: 4.6 mmol/L (ref 3.5–5.1)
Sodium: 137 mmol/L (ref 135–145)

## 2021-04-26 LAB — FERRITIN: Ferritin: 156 ng/mL (ref 11–307)

## 2021-04-26 LAB — IRON AND TIBC
Iron: 29 ug/dL (ref 28–170)
Saturation Ratios: 11 % (ref 10.4–31.8)
TIBC: 263 ug/dL (ref 250–450)
UIBC: 234 ug/dL

## 2021-04-26 MED ORDER — SENNOSIDES-DOCUSATE SODIUM 8.6-50 MG PO TABS
1.0000 | ORAL_TABLET | Freq: Two times a day (BID) | ORAL | Status: DC
  Administered 2021-04-26 – 2021-04-27 (×2): 1 via ORAL
  Filled 2021-04-26 (×2): qty 1

## 2021-04-26 MED ORDER — METOCLOPRAMIDE HCL 5 MG/ML IJ SOLN
10.0000 mg | Freq: Three times a day (TID) | INTRAMUSCULAR | Status: DC
  Administered 2021-04-26 – 2021-04-27 (×2): 10 mg via INTRAVENOUS
  Filled 2021-04-26 (×2): qty 2

## 2021-04-26 MED ORDER — POLYETHYLENE GLYCOL 3350 17 G PO PACK
17.0000 g | PACK | Freq: Every day | ORAL | Status: DC
  Administered 2021-04-26 – 2021-04-28 (×3): 17 g via ORAL
  Filled 2021-04-26 (×3): qty 1

## 2021-04-26 NOTE — Progress Notes (Signed)
PROGRESS NOTE    Patient: [name]                            PCP: [name], MD                    DOB: [birth year]            DOA: 04/24/2021 [id number]             DOS: 04/26/2021, 11:12 AM   LOS: 0 days   Date of Service: The patient was seen and examined on 04/26/2021  Subjective:   Patient was seen and examined, more awake, but still lethargic Otherwise comfortable laying in bed no complaints Hemodynamically stable Lives at home-dependent for all mobility ADLs  Brief Narrative:   Kyrstal Monterrosa  is a 86 y.o. female, with history of anemia, chronic diastolic heart failure, CKD, essential hypertension, GERD, brain hemorrhage, paroxysmal atrial fibrillation, type 2 diabetes mellitus, presents ED with chief complaint of hypoglycemia, and confusion.  ED: Hemodynamically stable,No leukocytosis, hemoglobin 8.5, platelets 274, Bicarb is low at 21, hemoglobin 194, Albumin 3.3, Lactic acid 1.9 Negative respiratory panel UA : Rare bacteria WBC 0-5, small leukocytosis Blood cultures pending, chest x-ray shows cardiomegaly with central pulmonary vascular congestion and left basilar atelectasis EKG shows 50 to accelerated junctional rhythm QTC 510   Assessment & Plan:   Principal Problem:   Acute metabolic encephalopathy Active Problems:   AKI (acute kidney injury) ([place])   Hypothermia   DM II (diabetes mellitus, type II), controlled ([place])   Hypertension   Acute respiratory failure with hypoxemia (HCC)   Type 2 diabetes mellitus (HCC)   Atrial fibrillation, chronic (HCC)   Mild protein-calorie malnutrition (HCC)    Acute metabolic encephalopathy -Mentation improving, still lethargic  -Likely due to hypoglycemia, medication such as high-dose Neurontin, uremia, -Blood sugars recorded as low as 40s on admission -CBGs 195, 159, 160 this a.m. -Ruled out infection, SIRS, sepsis, UA negative, chest x-ray clear, mild vascular congestion, -Blood sugars improved, mentation  has returned to baseline -Head CT revealing chronic changes, negative for any acute abnormality -Continue monitoring closely  Acute on chronic renal disease IIIa -BUN 80/creatinine 4.21 >>> 3.50 -GFR 10 -Baseline creatinine 1.52, was 1.58 on 02/28/2021 -Urinalysis consistent with proteinuria -Renal ultrasound reviewed -Nephrologist consulted... Appreciate close follow-up   Hypertension -Currently stable, continue current medication with exception of diuretics   Euvolemic -No signs of volume overload, with holding Lasix today  Uncontrolled diabetes mellitus type 2, hypoglycemia  -On admission CBG as low as 40s, currently 195, 159, 160 -Initiating CBG q. ACH S, with SSI coverage -Likely secondary reduced insulin clearance  Electrolytes: Hyperkalemia, hypermagnesemia -Likely due to AKI, home medication including Bactrim, monitoring closely, -Status post treatment with Lokelma,  -Monitoring closely   Acute on chronic respiratory failure with hypoxia -Oxygen dependent at home 2-3 L, currently on 2 L of oxygen, satting 100% -No signs of respiratory distress -No history of COPD or CHF -Chest x-ray reviewed, mild congestion, no significant lower extremity edema -No signs of volume overload -Monitoring closely  Transient hypothermia -Likely due to hypoglycemia -Temp improved 98.5 currently -Resolved  Severe debility/bedbound at baseline with history of CVA -PT OT consult for evaluation -Patient is from home,  likely would need home health on discharge -Discussed with TOC in detail    ----------------------------------------------------------------------------------------------------------------------------------------------- Nutritional status:  The patient's BMI is: Body mass index is 36.74 kg/m. I agree  with the assessment and plan as outlined  ---------------------------------------------------------------------------------------------------------------------------------------- Cultures; Blood cultures  Antimicrobials: None    Consultants: Nephrology   ------------------------------------------------------------------------------------------------------------------------------------------------  DVT prophylaxis:  SCD/Compression stockings Heparin Code Status:   Code Status: Full Code  Family Communication: Attempted to call her daughter The above findings and plan of care has been discussed with patient (and family)  in detail,  they expressed understanding and agreement of above. -Advance care planning has been discussed.   Admission status:   Status is: Observation  The patient remains OBS appropriate and will d/c before 2 midnights.    Level of care: Telemetry   Procedures:   No admission procedures for hospital encounter.    Antimicrobials:  Anti-infectives (From admission, onward)    None        Medication:   amLODipine  10 mg Oral Daily   darbepoetin (ARANESP) injection - NON-DIALYSIS  150 mcg Subcutaneous Once   famotidine  20 mg Oral Daily   heparin injection (subcutaneous)  5,000 Units Subcutaneous Q8H   hydrALAZINE  100 mg Oral TID   insulin aspart  0-15 Units Subcutaneous TID WC   insulin aspart  0-5 Units Subcutaneous QHS   insulin detemir  15 Units Subcutaneous QHS   simvastatin  20 mg Oral QHS    acetaminophen **OR** acetaminophen, albuterol, ondansetron **OR** ondansetron (ZOFRAN) IV, oxyCODONE   Objective:   Vitals:   04/25/21 1600 04/25/21 2126 04/26/21 0402 04/26/21 0808  BP: (!) 146/53 (!) 164/60 (!) 146/54 (!) 145/59  Pulse:  72 76   Resp:  18 18   Temp:  99.4 F (37.4 C) 98.7 F (37.1 C)   TempSrc:   Oral   SpO2:  100% 100%   Weight:      Height:        Intake/Output Summary (Last 24 hours) at 04/26/2021 1112 Last data filed at 04/26/2021 0900 Gross per 24  hour  Intake 480 ml  Output 2000 ml  Net -1520 ml   Filed Weights   04/24/21 1748  Weight: 97.1 kg     Examination:      Physical Exam:   General:  More awake, still lethargic  HEENT:  Normocephalic, PERRL, otherwise with in Normal limits   Neuro:  CNII-XII intact. , normal motor and sensation, reflexes intact   Lungs:   Clear to auscultation BL, Respirations unlabored, no wheezes / crackles  Cardio:    S1/S2, RRR, No murmure, No Rubs or Gallops   Abdomen:   Soft, non-tender, bowel sounds active all four quadrants,  no guarding or peritoneal signs.  Muscular skeletal:  Bedbound, minimal movement in lower extremities, patient does not ambulate at baseline Limited exam - in bed, able to move all 4 extremities, severe global generalized weaknesses 2+ pulses,  symmetric, No pitting edema  Skin:  Dry, warm to touch, negative for any Rashes,  Wounds: Please see nursing documentation         ------------------------------------------------------------------------------------------------------------------------------------------    LABs:  CBC Latest Ref Rng & Units 04/24/2021 02/26/2021 02/25/2021  WBC 4.0 - 10.5 K/uL 6.8 9.4 10.9(H)  Hemoglobin 12.0 - 15.0 g/dL 8.5(L) 9.0(L) 9.1(L)  Hematocrit 36.0 - 46.0 % 27.5(L) 29.5(L) 28.6(L)  Platelets 150 - 400 K/uL 274 221 241   CMP Latest Ref Rng & Units 04/26/2021 04/25/2021 04/25/2021  Glucose 70 - 99 mg/dL 159(H) 339(H) 430(H)  BUN 8 - 23 mg/dL 69(H) 80(H) 82(H)  Creatinine 0.44 - 1.00 mg/dL 3.50(H) 4.21(H) 4.31(H)  Sodium 135 - 145 mmol/L  137 134(L) 134(L)  Potassium 3.5 - 5.1 mmol/L 4.6 5.8(H) 6.2(H)  Chloride 98 - 111 mmol/L 107 104 105  CO2 22 - 32 mmol/L 23 22 20(L)  Calcium 8.9 - 10.3 mg/dL 8.2(L) 8.0(L) 8.2(L)  Total Protein 6.5 - 8.1 g/dL - 6.7 6.9  Total Bilirubin 0.3 - 1.2 mg/dL - 0.1(L) 0.2(L)  Alkaline Phos 38 - 126 U/L - 54 55  AST 15 - 41 U/L - 25 28  ALT 0 - 44 U/L - 21 22       Micro Results Recent  Results (from the past 240 hour(s))  Culture, blood (routine x 2)     Status: None (Preliminary result)   Collection Time: 04/24/21  6:15 PM   Specimen: BLOOD LEFT HAND  Result Value Ref Range Status   Specimen Description BLOOD LEFT HAND  Final   Special Requests   Final    BOTTLES DRAWN AEROBIC AND ANAEROBIC Blood Culture adequate volume   Culture   Final    NO GROWTH 2 DAYS Performed at [facility], [address], [place], [place] [address]    Report Status PENDING  Incomplete  Culture, blood (routine x 2)     Status: None (Preliminary result)   Collection Time: 04/24/21  6:15 PM   Specimen: BLOOD RIGHT HAND  Result Value Ref Range Status   Specimen Description BLOOD RIGHT HAND  Final   Special Requests   Final    BOTTLES DRAWN AEROBIC AND ANAEROBIC Blood Culture adequate volume   Culture   Final    NO GROWTH 2 DAYS Performed at [facility], [address], [place], [place] [address]    Report Status PENDING  Incomplete  Resp Panel by RT-PCR (Flu A&B, Covid) Nasopharyngeal Swab     Status: None   Collection Time: 04/24/21  8:14 PM   Specimen: Nasopharyngeal Swab; Nasopharyngeal(NP) swabs in vial transport medium  Result Value Ref Range Status   SARS Coronavirus 2 by RT PCR NEGATIVE NEGATIVE Final    Comment: (NOTE) SARS-CoV-2 target nucleic acids are NOT DETECTED.  The SARS-CoV-2 RNA is generally detectable in upper respiratory specimens during the acute phase of infection. The lowest concentration of SARS-CoV-2 viral copies this assay can detect is 138 copies/mL. A negative result does not preclude SARS-Cov-2 infection and should not be used as the sole basis for treatment or other patient management decisions. A negative result may occur with  improper specimen collection/handling, submission of specimen other than nasopharyngeal swab, presence of viral mutation(s) within the areas targeted by this assay, and inadequate number of viral copies(<138 copies/mL). A  negative result must be combined with clinical observations, patient history, and epidemiological information. The expected result is Negative.  Fact Sheet for Patients:  EntrepreneurPulse.com.au  Fact Sheet for Healthcare Providers:  IncredibleEmployment.be  This test is no t yet approved or cleared by the [place] FDA and  has been authorized for detection and/or diagnosis of SARS-CoV-2 by FDA under an Emergency Use Authorization (EUA). This EUA will remain  in effect (meaning this test can be used) for the duration of the COVID-19 declaration under Section 564(b)(1) of the Act, 21 U.S.C.section 360bbb-3(b)(1), unless the authorization is terminated  or revoked sooner.       Influenza A by PCR NEGATIVE NEGATIVE Final   Influenza B by PCR NEGATIVE NEGATIVE Final    Comment: (NOTE) The Xpert Xpress SARS-CoV-2/FLU/RSV plus assay is intended as an aid in the diagnosis of influenza from Nasopharyngeal swab specimens and  should not be used as a sole basis for treatment. Nasal washings and aspirates are unacceptable for Xpert Xpress SARS-CoV-2/FLU/RSV testing.  Fact Sheet for Patients: EntrepreneurPulse.com.au  Fact Sheet for Healthcare Providers: IncredibleEmployment.be  This test is not yet approved or cleared by the [place] FDA and has been authorized for detection and/or diagnosis of SARS-CoV-2 by FDA under an Emergency Use Authorization (EUA). This EUA will remain in effect (meaning this test can be used) for the duration of the COVID-19 declaration under Section 564(b)(1) of the Act, 21 U.S.C. section 360bbb-3(b)(1), unless the authorization is terminated or revoked.  Performed at [facility], [address], [place], [place] [address]   Urine Culture     Status: None   Collection Time: 04/24/21 10:37 PM   Specimen: Urine, Clean Catch  Result Value Ref Range Status   Specimen Description    Final    URINE, CLEAN CATCH Performed at [facility], [address], [place], [place] [address]    Special Requests   Final    NONE Performed at [facility], [address], [place], [place] [address]    Culture   Final    NO GROWTH Performed at [facility], [place] [address] [place], [place] [address]    Report Status 04/26/2021 FINAL  Final    Radiology Reports CT HEAD WO CONTRAST (5MM)  Result Date: 04/25/2021 CLINICAL DATA:  Mental status change confusion. EXAM: CT HEAD WITHOUT CONTRAST TECHNIQUE: Contiguous axial images were obtained from the base of the skull through the vertex without intravenous contrast. COMPARISON:  09/24/2020 FINDINGS: Brain: Ventricle size and cerebral volume normal. Moderate white matter hypodensity bilaterally, chronic and unchanged from prior MRI. Chronic infarct right cerebellum. Negative for acute infarct, hemorrhage, mass Vascular: Negative for hyperdense vessel. Atherosclerotic calcification. Skull: Negative Sinuses/Orbits: Paranasal sinuses clear. Bilateral cataract extraction Other: None IMPRESSION: No acute abnormality Chronic ischemic changes are stable from prior studies. Electronically Signed   By: Franchot Gallo M.D.   On: 04/25/2021 11:39   US RENAL  Result Date: 04/25/2021 CLINICAL DATA:  In a 86 year old female presents for evaluation of acute kidney injury. EXAM: RENAL / URINARY TRACT ULTRASOUND COMPLETE COMPARISON:  Comparison with CT imaging from December 26, 2019. FINDINGS: Right Kidney: Renal measurements: 10.6 x 4.3 x 4.8 cm = volume: 116.1 mL. Increased cortical echogenicity without hydronephrosis or gross lesion or nephrolithiasis. Signs of cortical scarring. Left Kidney: Renal measurements: 8.7 x 4.9 x 5.4 cm = volume: 119 mL. Increased cortical echogenicity without hydronephrosis, visible calculus or gross lesion. Signs of cortical scarring. Bladder: Appears normal for degree of bladder distention. Other: Study limited by patient  body habitus IMPRESSION: Mild increased cortical echogenicity with signs of cortical scarring. No hydronephrosis. Exam limited by body habitus. Electronically Signed   By: Zetta Bills M.D.   On: 04/25/2021 11:44   DG Chest Port 1 View  Result Date: 04/24/2021 CLINICAL DATA:  Shortness of breath. EXAM: PORTABLE CHEST 1 VIEW COMPARISON:  Chest x-ray 02/24/2021.  CT chest 07/23/2019. FINDINGS: The heart is enlarged. There central pulmonary vascular congestion. There is minimal left base atelectasis. Costophrenic angles are clear. There is no pneumothorax or acute fracture. IMPRESSION: 1. Cardiomegaly with central pulmonary vascular congestion. 2. Left basilar atelectasis. Electronically Signed   By: Ronney Asters M.D.   On: 04/24/2021 21:20   ECHOCARDIOGRAM COMPLETE  Result Date: 04/25/2021    ECHOCARDIOGRAM REPORT   Patient Name:   BRAYLEY MACKOWIAK Date of Exam: 04/25/2021 Medical Rec #:  [id number]    Height:       64.0 in Accession #:    [id number]   Weight:       214.1 lb Date of Birth:  02-11-1935     BSA:          2.014 m Patient Age:    86 years     BP:           134/59 mmHg Patient Gender: F            HR:           69 bpm. Exam Location:  [facility] Procedure: 2D Echo, Cardiac Doppler and Color Doppler Indications:    Dyspnea R06.00  History:        Patient has prior history of Echocardiogram examinations, most                 recent 02/24/2021. CHF, Stroke, Arrythmias:Atrial Fibrillation;                 Risk Factors:Hypertension and Dyslipidemia. Hx of COVID-19                 infection.  Sonographer:    Alvino Chapel RCS Referring Phys: [id number] ASIA B [place]  1. Left ventricular ejection fraction, by estimation, is 65 to 70%. The left ventricle has normal function. The left ventricle has no regional wall motion abnormalities. There is mild left ventricular hypertrophy. Left ventricular diastolic parameters are consistent with Grade II diastolic dysfunction (pseudonormalization).  Elevated left atrial pressure.  2. Right ventricular systolic function is normal. The right ventricular size is normal. Tricuspid regurgitation signal is inadequate for assessing PA pressure.  3. Left atrial size was mildly dilated.  4. The mitral valve is normal in structure. No evidence of mitral valve regurgitation. No evidence of mitral stenosis.  5. The aortic valve is tricuspid. Aortic valve regurgitation is not visualized. No aortic stenosis is present.  6. The inferior vena cava is dilated in size with <50% respiratory variability, suggesting right atrial pressure of 15 mmHg. FINDINGS  Left Ventricle: Left ventricular ejection fraction, by estimation, is 65 to 70%. The left ventricle has normal function. The left ventricle has no regional wall motion abnormalities. Definity contrast agent was given IV to delineate the left ventricular  endocardial borders. The left ventricular internal cavity size was normal in size. There is mild left ventricular hypertrophy. Left ventricular diastolic parameters are consistent with Grade II diastolic dysfunction (pseudonormalization). Elevated left atrial pressure. Right Ventricle: The right ventricular size is normal. No increase in right ventricular wall thickness. Right ventricular systolic function is normal. Tricuspid regurgitation signal is inadequate for assessing PA pressure. Left Atrium: Left atrial size was mildly dilated. Right Atrium: Right atrial size was normal in size. Pericardium: There is no evidence of pericardial effusion. Mitral Valve: The mitral valve is normal in structure. No evidence of mitral valve regurgitation. No evidence of mitral valve stenosis. MV peak gradient, 10.9 mmHg. The mean mitral valve gradient is 3.0 mmHg. Tricuspid Valve: The tricuspid valve is normal in structure. Tricuspid valve regurgitation is not demonstrated. No evidence of tricuspid stenosis. Aortic Valve: The aortic valve is tricuspid. Aortic valve regurgitation is not  visualized. No aortic stenosis is present. Aortic valve mean gradient measures 8.0 mmHg. Aortic valve peak gradient measures 13.8 mmHg. Aortic valve area, by VTI measures 1.76  cm. Pulmonic Valve: The pulmonic valve was not well visualized. Pulmonic valve regurgitation is not visualized. No evidence of pulmonic  stenosis. Aorta: The aortic root is normal in size and structure. Venous: The inferior vena cava is dilated in size with less than 50% respiratory variability, suggesting right atrial pressure of 15 mmHg. IAS/Shunts: No atrial level shunt detected by color flow Doppler.  LEFT VENTRICLE PLAX 2D LVIDd:         4.80 cm   Diastology LVIDs:         3.10 cm   LV e' medial:    5.77 cm/s LV PW:         1.20 cm   LV E/e' medial:  24.4 LV IVS:        1.20 cm   LV e' lateral:   6.31 cm/s LVOT diam:     1.80 cm   LV E/e' lateral: 22.3 LV SV:         70 LV SV Index:   35 LVOT Area:     2.54 cm  RIGHT VENTRICLE RV S prime:     15.40 cm/s TAPSE (M-mode): 2.0 cm LEFT ATRIUM             Index        RIGHT ATRIUM           Index LA diam:        3.60 cm 1.79 cm/m   RA Area:     24.30 cm LA Vol (A2C):   96.0 ml 47.67 ml/m  RA Volume:   78.30 ml  38.88 ml/m LA Vol (A4C):   69.3 ml 34.41 ml/m LA Biplane Vol: 84.4 ml 41.91 ml/m  AORTIC VALVE AV Area (Vmax):    1.79 cm AV Area (Vmean):   1.85 cm AV Area (VTI):     1.76 cm AV Vmax:           186.00 cm/s AV Vmean:          126.000 cm/s AV VTI:            0.399 m AV Peak Grad:      13.8 mmHg AV Mean Grad:      8.0 mmHg LVOT Vmax:         131.00 cm/s LVOT Vmean:        91.700 cm/s LVOT VTI:          0.276 m LVOT/AV VTI ratio: 0.69  AORTA Ao Root diam: 3.40 cm MITRAL VALVE MV Area (PHT): 2.16 cm     SHUNTS MV Area VTI:   1.20 cm     Systemic VTI:  0.28 m MV Peak grad:  10.9 mmHg    Systemic Diam: 1.80 cm MV Mean grad:  3.0 mmHg MV Vmax:       1.65 m/s MV Vmean:      72.1 cm/s MV Decel Time: 352 msec MV E velocity: 141.00 cm/s MV A velocity: 83.20 cm/s MV E/A ratio:  1.69  Carlyle Dolly MD Electronically signed by Carlyle Dolly MD Signature Date/Time: 04/25/2021/2:50:21 PM    Final     SIGNED: Deatra James, MD, FHM. [facility],  Pager (please use [email or web address] to page/text) Please use Epic Secure Chat for non-urgent communication (7AM-7PM)  If 7PM-7AM, please contact night-coverage [email or web address], 04/26/2021, 11:12 AM

## 2021-04-27 ENCOUNTER — Inpatient Hospital Stay (HOSPITAL_COMMUNITY): Payer: Medicare HMO

## 2021-04-27 DIAGNOSIS — G9341 Metabolic encephalopathy: Secondary | ICD-10-CM | POA: Diagnosis not present

## 2021-04-27 LAB — GLUCOSE, CAPILLARY
Glucose-Capillary: 185 mg/dL — ABNORMAL HIGH (ref 70–99)
Glucose-Capillary: 218 mg/dL — ABNORMAL HIGH (ref 70–99)
Glucose-Capillary: 238 mg/dL — ABNORMAL HIGH (ref 70–99)
Glucose-Capillary: 227 mg/dL — ABNORMAL HIGH (ref 70–99)

## 2021-04-27 LAB — RENAL FUNCTION PANEL
Albumin: 3 g/dL — ABNORMAL LOW (ref 3.5–5.0)
Anion gap: 3 — ABNORMAL LOW (ref 5–15)
BUN: 49 mg/dL — ABNORMAL HIGH (ref 8–23)
CO2: 28 mmol/L (ref 22–32)
Calcium: 8.5 mg/dL — ABNORMAL LOW (ref 8.9–10.3)
Chloride: 108 mmol/L (ref 98–111)
Creatinine, Ser: 2.37 mg/dL — ABNORMAL HIGH (ref 0.44–1.00)
GFR, Estimated: 20 mL/min — ABNORMAL LOW (ref >60)
Glucose, Bld: 207 mg/dL — ABNORMAL HIGH (ref 70–99)
Phosphorus: 3.8 mg/dL (ref 2.5–4.6)
Potassium: 4.8 mmol/L (ref 3.5–5.1)
Sodium: 139 mmol/L (ref 135–145)

## 2021-04-27 LAB — COMPREHENSIVE METABOLIC PANEL
ALT: 17 U/L (ref 0–44)
AST: 13 U/L — ABNORMAL LOW (ref 15–41)
Albumin: 3.5 g/dL (ref 3.5–5.0)
Alkaline Phosphatase: 59 U/L (ref 38–126)
Anion gap: 8 (ref 5–15)
BUN: 47 mg/dL — ABNORMAL HIGH (ref 8–23)
CO2: 23 mmol/L (ref 22–32)
Calcium: 8.8 mg/dL — ABNORMAL LOW (ref 8.9–10.3)
Chloride: 108 mmol/L (ref 98–111)
Creatinine, Ser: 2.26 mg/dL — ABNORMAL HIGH (ref 0.44–1.00)
GFR, Estimated: 21 mL/min — ABNORMAL LOW (ref >60)
Glucose, Bld: 235 mg/dL — ABNORMAL HIGH (ref 70–99)
Potassium: 4.7 mmol/L (ref 3.5–5.1)
Sodium: 139 mmol/L (ref 135–145)
Total Bilirubin: 0.4 mg/dL (ref 0.3–1.2)
Total Protein: 7.8 g/dL (ref 6.5–8.1)

## 2021-04-27 MED ORDER — FUROSEMIDE 10 MG/ML IJ SOLN: 20.0000 mg | Freq: Two times a day (BID) | INTRAMUSCULAR | Status: DC

## 2021-04-27 MED ORDER — SENNOSIDES-DOCUSATE SODIUM 8.6-50 MG PO TABS
2.0000 | ORAL_TABLET | Freq: Two times a day (BID) | ORAL | Status: DC
Start: 2021-04-27 — End: 2021-04-28
  Administered 2021-04-27 – 2021-04-28 (×3): 2 via ORAL
  Filled 2021-04-27 (×3): qty 2

## 2021-04-27 MED ORDER — ALPRAZOLAM 0.5 MG PO TABS
0.5000 mg | ORAL_TABLET | Freq: Three times a day (TID) | ORAL | Status: DC | PRN
  Administered 2021-04-27: 0.5 mg via ORAL
  Filled 2021-04-27: qty 1

## 2021-04-27 MED ORDER — METOCLOPRAMIDE HCL 5 MG/ML IJ SOLN: 10.0000 mg | Freq: Three times a day (TID) | INTRAMUSCULAR | Status: DC | PRN

## 2021-04-27 MED ORDER — FLEET ENEMA 7-19 GM/118ML RE ENEM
1.0000 | ENEMA | Freq: Once | RECTAL | Status: AC
  Administered 2021-04-27: 1 via RECTAL

## 2021-04-27 MED ORDER — INSULIN DETEMIR 100 UNIT/ML ~~LOC~~ SOLN
20.0000 [IU] | Freq: Every day | SUBCUTANEOUS | Status: DC
  Administered 2021-04-27: 20 [IU] via SUBCUTANEOUS
  Filled 2021-04-27 (×2): qty 0.2

## 2021-04-27 MED ORDER — FUROSEMIDE 10 MG/ML IJ SOLN
40.0000 mg | Freq: Two times a day (BID) | INTRAMUSCULAR | Status: AC
  Administered 2021-04-27 – 2021-04-28 (×2): 40 mg via INTRAVENOUS
  Filled 2021-04-27 (×2): qty 4

## 2021-04-27 NOTE — Progress Notes (Signed)
PROGRESS NOTE    Patient: [name]                            PCP: [name], MD                    DOB: 1935/03/29            DOA: 04/24/2021 [id number]             DOS: 04/27/2021, 11:00 AM   LOS: 1 day   Date of Service: The patient was seen and examined on 04/27/2021  Subjective:   The patient was seen and examined this morning, complaining shortness of breath, but satting 99% on 2 L of oxygen, complaining of abdominal discomfort Patient has not had a successful bowel movement since admission  Daughter and son present at bedside-updated Lives at home-dependent for all mobility ADLs  CBGs has improved  Brief Narrative:   [name]  is a 86 y.o. female, with history of anemia, chronic diastolic heart failure, CKD, essential hypertension, GERD, brain hemorrhage, paroxysmal atrial fibrillation, type 2 diabetes mellitus, presents ED with chief complaint of hypoglycemia, and confusion.  ED: Hemodynamically stable,No leukocytosis, hemoglobin 8.5, platelets 274, Bicarb is low at 21, hemoglobin 194, Albumin 3.3, Lactic acid 1.9 Negative respiratory panel UA : Rare bacteria WBC 0-5, small leukocytosis Blood cultures pending, chest x-ray shows cardiomegaly with central pulmonary vascular congestion and left basilar atelectasis EKG shows 50 to accelerated junctional rhythm QTC 510   Assessment & Plan:   Principal Problem:   Acute metabolic encephalopathy Active Problems:   AKI (acute kidney injury) ([place])   Hypothermia   DM II (diabetes mellitus, type II), controlled ([place])   Hypertension   Acute respiratory failure with hypoxemia (HCC)   Type 2 diabetes mellitus (HCC)   Atrial fibrillation, chronic (HCC)   Mild protein-calorie malnutrition (HCC)    Acute metabolic encephalopathy -Mentation is improved, back to baseline  -Likely due to hypoglycemia, medication such as high-dose Neurontin, uremia, -Blood sugars recorded as low as 40s on admission -CBGs 195,  159, 160 this a.m. -Ruled out infection, SIRS, sepsis, UA negative, chest x-ray clear, mild vascular congestion, -Blood sugars improved, mentation has returned to baseline -Head CT revealing chronic changes, negative for any acute abnormality -Continue monitoring closely  Acute on chronic renal disease IIIa -BUN 80/creatinine 4.21 >>> 3.50 >> 2.26 -GFR 10, BUN 49, 47, -Baseline creatinine 1.52, was 1.58 on 02/28/2021 -Urinalysis consistent with proteinuria -Renal ultrasound reviewed -Nephrologist consulted... Appreciate close follow-up and input   Hypertension -Hypertensive this morning, resuming home medication of Norvasc, hydralazine,  Shortness of breath,  -Satting 100% on 2 L of oxygen -chest x-ray revealed, cardiomegaly congestion -Initiate IV Lasix 40 mg daily(home medication torsemide 20 mg twice daily)  Euvolemic -Shortness of breath chest x-ray consistent with some congestion Mild lower extremity edema   Uncontrolled diabetes mellitus type 2, hypoglycemia  -On admission CBG as low as 40s, currently 195, 191, 96 -Initiating CBG q. ACH S, with SSI coverage -Likely secondary reduced insulin clearance -Resuming long-acting insulin Levemir 20u nightly  Electrolytes: Hyperkalemia, hypermagnesemia -Likely due to AKI, home medication including Bactrim, monitoring closely, -Status post treatment with Lokelma,  -Monitoring closely   Acute on chronic respiratory failure with hypoxia -Oxygen dependent at home 2-3 L, currently on 2 L of oxygen, satting 99% -Complaining shortness of breath -initiating Lasix -No history of COPD or CHF -Chest x-ray reviewed 06/28/2020,  positive cardiomegaly, congestion -Monitoring closely  Transient hypothermia -Likely due to hypoglycemia -Temp improved 98.5 currently -Resolved  Severe debility/bedbound at baseline with history of CVA -PT OT consult for evaluation -Patient is from home,  likely would need home health on  discharge -Discussed with TOC in detail  Constipation -No bowel movement since admission -Initiating bowel regimen, MiraLAX, Senokot  ----------------------------------------------------------------------------------------------------------------------------------------------- Nutritional status:  The patient's BMI is: Body mass index is 36.74 kg/m. I agree with the assessment and plan as outlined ---------------------------------------------------------------------------------------------------------------------------------------- Cultures; Blood cultures  Antimicrobials: None    Consultants: Nephrology  -----------------------------------------------------------------------------------------------------------------------------------------  DVT prophylaxis:  SCD/Compression stockings Heparin Code Status:   Code Status: Full Code  Family Communication: Attempted to call her daughter The above findings and plan of care has been discussed with patient (and family)  in detail,  they expressed understanding and agreement of above. -Advance care planning has been discussed.   Admission status:   Status is: Observation  The patient remains OBS appropriate and will d/c before 2 midnights.    Level of care: Telemetry   Procedures:   No admission procedures for hospital encounter.    Antimicrobials:  Anti-infectives (From admission, onward)    None        Medication:   amLODipine  10 mg Oral Daily   darbepoetin (ARANESP) injection - NON-DIALYSIS  150 mcg Subcutaneous Once   famotidine  20 mg Oral Daily   furosemide  20 mg Intravenous Q12H   heparin injection (subcutaneous)  5,000 Units Subcutaneous Q8H   hydrALAZINE  100 mg Oral TID   insulin aspart  0-15 Units Subcutaneous TID WC   insulin aspart  0-5 Units Subcutaneous QHS   insulin detemir  15 Units Subcutaneous QHS   metoCLOPramide (REGLAN) injection  10 mg Intravenous Q8H   polyethylene glycol  17 g  Oral Daily   senna-docusate  1 tablet Oral BID   simvastatin  20 mg Oral QHS    acetaminophen **OR** acetaminophen, albuterol, ALPRAZolam, ondansetron **OR** ondansetron (ZOFRAN) IV, oxyCODONE   Objective:   Vitals:   04/26/21 1500 04/26/21 2016 04/27/21 0502 04/27/21 0845  BP: (!) 159/59 (!) 156/53 (!) 162/55 (!) 190/73  Pulse: 79 86 79   Resp: 20 20 18    Temp: 98 F (36.7 C) 98.6 F (37 C) 99.1 F (37.3 C)   TempSrc: Oral  Oral   SpO2: 98% 96% 99%   Weight:      Height:        Intake/Output Summary (Last 24 hours) at 04/27/2021 1100 Last data filed at 04/27/2021 0900 Gross per 24 hour  Intake 240 ml  Output 1500 ml  Net -1260 ml   Filed Weights   04/24/21 1748  Weight: 97.1 kg     Examination:     General:  Alert, oriented, cooperative, no distress;   HEENT:  Normocephalic, PERRL, otherwise with in Normal limits   Neuro:  CNII-XII intact. , normal motor and sensation, reflexes intact   Lungs:   Clear to auscultation BL, Respirations unlabored, no wheezes / crackles  Cardio:    S1/S2, RRR, No murmure, No Rubs or Gallops   Abdomen:   Soft, non-tender, bowel sounds active all four quadrants,  no guarding or peritoneal signs.  Muscular skeletal:  Limited exam - in bed, able to move all upper extremities, severe generalized weaknesses, severe lower extremity weaknesses, bedbound baseline 2+ pulses,  symmetric, +2 pitting edema  Skin:  Dry, warm to touch, negative for any Rashes,  Wounds: Please see  nursing documentation        ----------------------------------------------------------------------------------------------------------------------------------    LABs:  CBC Latest Ref Rng & Units 04/24/2021 02/26/2021 02/25/2021  WBC 4.0 - 10.5 K/uL 6.8 9.4 10.9(H)  Hemoglobin 12.0 - 15.0 g/dL 8.5(L) 9.0(L) 9.1(L)  Hematocrit 36.0 - 46.0 % 27.5(L) 29.5(L) 28.6(L)  Platelets 150 - 400 K/uL 274 221 241   CMP Latest Ref Rng & Units 04/27/2021 04/27/2021 04/26/2021   Glucose 70 - 99 mg/dL 235(H) 207(H) 159(H)  BUN 8 - 23 mg/dL 47(H) 49(H) 69(H)  Creatinine 0.44 - 1.00 mg/dL 2.26(H) 2.37(H) 3.50(H)  Sodium 135 - 145 mmol/L 139 139 137  Potassium 3.5 - 5.1 mmol/L 4.7 4.8 4.6  Chloride 98 - 111 mmol/L 108 108 107  CO2 22 - 32 mmol/L 23 28 23   Calcium 8.9 - 10.3 mg/dL 8.8(L) 8.5(L) 8.2(L)  Total Protein 6.5 - 8.1 g/dL 7.8 - -  Total Bilirubin 0.3 - 1.2 mg/dL 0.4 - -  Alkaline Phos 38 - 126 U/L 59 - -  AST 15 - 41 U/L 13(L) - -  ALT 0 - 44 U/L 17 - -       Micro Results Recent Results (from the past 240 hour(s))  Culture, blood (routine x 2)     Status: None (Preliminary result)   Collection Time: 04/24/21  6:15 PM   Specimen: BLOOD LEFT HAND  Result Value Ref Range Status   Specimen Description BLOOD LEFT HAND  Final   Special Requests   Final    BOTTLES DRAWN AEROBIC AND ANAEROBIC Blood Culture adequate volume   Culture   Final    NO GROWTH 3 DAYS Performed at [facility], [address], [place], [place] [address]    Report Status PENDING  Incomplete  Culture, blood (routine x 2)     Status: None (Preliminary result)   Collection Time: 04/24/21  6:15 PM   Specimen: BLOOD RIGHT HAND  Result Value Ref Range Status   Specimen Description BLOOD RIGHT HAND  Final   Special Requests   Final    BOTTLES DRAWN AEROBIC AND ANAEROBIC Blood Culture adequate volume   Culture   Final    NO GROWTH 3 DAYS Performed at [facility], [address] [place], [place] [address]    Report Status PENDING  Incomplete  Resp Panel by RT-PCR (Flu A&B, Covid) Nasopharyngeal Swab     Status: None   Collection Time: 04/24/21  8:14 PM   Specimen: Nasopharyngeal Swab; Nasopharyngeal(NP) swabs in vial transport medium  Result Value Ref Range Status   SARS Coronavirus 2 by RT PCR NEGATIVE NEGATIVE Final    Comment: (NOTE) SARS-CoV-2 target nucleic acids are NOT DETECTED.  The SARS-CoV-2 RNA is generally detectable in upper respiratory specimens during the  acute phase of infection. The lowest concentration of SARS-CoV-2 viral copies this assay can detect is 138 copies/mL. A negative result does not preclude SARS-Cov-2 infection and should not be used as the sole basis for treatment or other patient management decisions. A negative result may occur with  improper specimen collection/handling, submission of specimen other than nasopharyngeal swab, presence of viral mutation(s) within the areas targeted by this assay, and inadequate number of viral copies(<138 copies/mL). A negative result must be combined with clinical observations, patient history, and epidemiological information. The expected result is Negative.  Fact Sheet for Patients:  EntrepreneurPulse.com.au  Fact Sheet for Healthcare Providers:  IncredibleEmployment.be  This test is no t yet approved or cleared by the [place] and  has been authorized for detection and/or diagnosis of SARS-CoV-2 by FDA under an Emergency Use Authorization (EUA). This EUA will remain  in effect (meaning this test can be used) for the duration of the COVID-19 declaration under Section 564(b)(1) of the Act, 21 U.S.C.section 360bbb-3(b)(1), unless the authorization is terminated  or revoked sooner.       Influenza A by PCR NEGATIVE NEGATIVE Final   Influenza B by PCR NEGATIVE NEGATIVE Final    Comment: (NOTE) The Xpert Xpress SARS-CoV-2/FLU/RSV plus assay is intended as an aid in the diagnosis of influenza from Nasopharyngeal swab specimens and should not be used as a sole basis for treatment. Nasal washings and aspirates are unacceptable for Xpert Xpress SARS-CoV-2/FLU/RSV testing.  Fact Sheet for Patients: EntrepreneurPulse.com.au  Fact Sheet for Healthcare Providers: IncredibleEmployment.be  This test is not yet approved or cleared by the [place] FDA and has been authorized for detection and/or  diagnosis of SARS-CoV-2 by FDA under an Emergency Use Authorization (EUA). This EUA will remain in effect (meaning this test can be used) for the duration of the COVID-19 declaration under Section 564(b)(1) of the Act, 21 U.S.C. section 360bbb-3(b)(1), unless the authorization is terminated or revoked.  Performed at [facility], [address] [place], [place] [address]   Urine Culture     Status: None   Collection Time: 04/24/21 10:37 PM   Specimen: Urine, Clean Catch  Result Value Ref Range Status   Specimen Description   Final    URINE, CLEAN CATCH Performed at [facility], [address] [place], [place] [address]    Special Requests   Final    NONE Performed at [facility], [address] [place],  [address]    Culture   Final    NO GROWTH Performed at [facility], [place] [address] [place],  [address]    Report Status 04/26/2021 FINAL  Final    Radiology Reports CT HEAD WO CONTRAST (5MM)  Result Date: 04/25/2021 CLINICAL DATA:  Mental status change confusion. EXAM: CT HEAD WITHOUT CONTRAST TECHNIQUE: Contiguous axial images were obtained from the base of the skull through the vertex without intravenous contrast. COMPARISON:  09/24/2020 FINDINGS: Brain: Ventricle size and cerebral volume normal. Moderate white matter hypodensity bilaterally, chronic and unchanged from prior MRI. Chronic infarct right cerebellum. Negative for acute infarct, hemorrhage, mass Vascular: Negative for hyperdense vessel. Atherosclerotic calcification. Skull: Negative Sinuses/Orbits: Paranasal sinuses clear. Bilateral cataract extraction Other: None IMPRESSION: No acute abnormality Chronic ischemic changes are stable from prior studies. Electronically Signed   By: Franchot Gallo M.D.   On: 04/25/2021 11:39   US RENAL  Result Date: 04/25/2021 CLINICAL DATA:  In a 86 year old female presents for evaluation of acute kidney injury. EXAM: RENAL / URINARY TRACT ULTRASOUND COMPLETE  COMPARISON:  Comparison with CT imaging from December 26, 2019. FINDINGS: Right Kidney: Renal measurements: 10.6 x 4.3 x 4.8 cm = volume: 116.1 mL. Increased cortical echogenicity without hydronephrosis or gross lesion or nephrolithiasis. Signs of cortical scarring. Left Kidney: Renal measurements: 8.7 x 4.9 x 5.4 cm = volume: 119 mL. Increased cortical echogenicity without hydronephrosis, visible calculus or gross lesion. Signs of cortical scarring. Bladder: Appears normal for degree of bladder distention. Other: Study limited by patient body habitus IMPRESSION: Mild increased cortical echogenicity with signs of cortical scarring. No hydronephrosis. Exam limited by body habitus. Electronically Signed   By: Zetta Bills M.D.   On: 04/25/2021 11:44   DG CHEST PORT 1 VIEW  Result  Date: 04/27/2021 CLINICAL DATA:  Shortness of breath EXAM: PORTABLE CHEST 1 VIEW COMPARISON:  04/24/2021 FINDINGS: Stable cardiomegaly and central pulmonary vascular congestion. Mild interstitial edema. No significant pleural effusion. Left basilar atelectasis. IMPRESSION: Cardiomegaly and central pulmonary vascular congestion with mild interstitial edema. Electronically Signed   By: Macy Mis M.D.   On: 04/27/2021 09:13   DG Chest Port 1 View  Result Date: 04/24/2021 CLINICAL DATA:  Shortness of breath. EXAM: PORTABLE CHEST 1 VIEW COMPARISON:  Chest x-ray 02/24/2021.  CT chest 07/23/2019. FINDINGS: The heart is enlarged. There central pulmonary vascular congestion. There is minimal left base atelectasis. Costophrenic angles are clear. There is no pneumothorax or acute fracture. IMPRESSION: 1. Cardiomegaly with central pulmonary vascular congestion. 2. Left basilar atelectasis. Electronically Signed   By: Ronney Asters M.D.   On: 04/24/2021 21:20   ECHOCARDIOGRAM COMPLETE  Result Date: 04/25/2021    ECHOCARDIOGRAM REPORT   Patient Name:   AUBERY DATE Date of Exam: 04/25/2021 Medical Rec #:  [id number]    Height:       64.0 in  Accession #:    [id number]   Weight:       214.1 lb Date of Birth:  June 14, 1934     BSA:          2.014 m Patient Age:    72 years     BP:           134/59 mmHg Patient Gender: F            HR:           69 bpm. Exam Location:  [facility] Procedure: 2D Echo, Cardiac Doppler and Color Doppler Indications:    Dyspnea R06.00  History:        Patient has prior history of Echocardiogram examinations, most                 recent 02/24/2021. CHF, Stroke, Arrythmias:Atrial Fibrillation;                 Risk Factors:Hypertension and Dyslipidemia. Hx of COVID-19                 infection.  Sonographer:    Alvino Chapel RCS Referring Phys: [id number] ASIA B [place]  1. Left ventricular ejection fraction, by estimation, is 65 to 70%. The left ventricle has normal function. The left ventricle has no regional wall motion abnormalities. There is mild left ventricular hypertrophy. Left ventricular diastolic parameters are consistent with Grade II diastolic dysfunction (pseudonormalization). Elevated left atrial pressure.  2. Right ventricular systolic function is normal. The right ventricular size is normal. Tricuspid regurgitation signal is inadequate for assessing PA pressure.  3. Left atrial size was mildly dilated.  4. The mitral valve is normal in structure. No evidence of mitral valve regurgitation. No evidence of mitral stenosis.  5. The aortic valve is tricuspid. Aortic valve regurgitation is not visualized. No aortic stenosis is present.  6. The inferior vena cava is dilated in size with <50% respiratory variability, suggesting right atrial pressure of 15 mmHg. FINDINGS  Left Ventricle: Left ventricular ejection fraction, by estimation, is 65 to 70%. The left ventricle has normal function. The left ventricle has no regional wall motion abnormalities. Definity contrast agent was given IV to delineate the left ventricular  endocardial borders. The left ventricular internal cavity size was normal in size. There is  mild left ventricular hypertrophy. Left ventricular diastolic parameters are consistent with Grade II diastolic dysfunction (pseudonormalization).  Elevated left atrial pressure. Right Ventricle: The right ventricular size is normal. No increase in right ventricular wall thickness. Right ventricular systolic function is normal. Tricuspid regurgitation signal is inadequate for assessing PA pressure. Left Atrium: Left atrial size was mildly dilated. Right Atrium: Right atrial size was normal in size. Pericardium: There is no evidence of pericardial effusion. Mitral Valve: The mitral valve is normal in structure. No evidence of mitral valve regurgitation. No evidence of mitral valve stenosis. MV peak gradient, 10.9 mmHg. The mean mitral valve gradient is 3.0 mmHg. Tricuspid Valve: The tricuspid valve is normal in structure. Tricuspid valve regurgitation is not demonstrated. No evidence of tricuspid stenosis. Aortic Valve: The aortic valve is tricuspid. Aortic valve regurgitation is not visualized. No aortic stenosis is present. Aortic valve mean gradient measures 8.0 mmHg. Aortic valve peak gradient measures 13.8 mmHg. Aortic valve area, by VTI measures 1.76  cm. Pulmonic Valve: The pulmonic valve was not well visualized. Pulmonic valve regurgitation is not visualized. No evidence of pulmonic stenosis. Aorta: The aortic root is normal in size and structure. Venous: The inferior vena cava is dilated in size with less than 50% respiratory variability, suggesting right atrial pressure of 15 mmHg. IAS/Shunts: No atrial level shunt detected by color flow Doppler.  LEFT VENTRICLE PLAX 2D LVIDd:         4.80 cm   Diastology LVIDs:         3.10 cm   LV e' medial:    5.77 cm/s LV PW:         1.20 cm   LV E/e' medial:  24.4 LV IVS:        1.20 cm   LV e' lateral:   6.31 cm/s LVOT diam:     1.80 cm   LV E/e' lateral: 22.3 LV SV:         70 LV SV Index:   35 LVOT Area:     2.54 cm  RIGHT VENTRICLE RV S prime:     15.40 cm/s TAPSE  (M-mode): 2.0 cm LEFT ATRIUM             Index        RIGHT ATRIUM           Index LA diam:        3.60 cm 1.79 cm/m   RA Area:     24.30 cm LA Vol (A2C):   96.0 ml 47.67 ml/m  RA Volume:   78.30 ml  38.88 ml/m LA Vol (A4C):   69.3 ml 34.41 ml/m LA Biplane Vol: 84.4 ml 41.91 ml/m  AORTIC VALVE AV Area (Vmax):    1.79 cm AV Area (Vmean):   1.85 cm AV Area (VTI):     1.76 cm AV Vmax:           186.00 cm/s AV Vmean:          126.000 cm/s AV VTI:            0.399 m AV Peak Grad:      13.8 mmHg AV Mean Grad:      8.0 mmHg LVOT Vmax:         131.00 cm/s LVOT Vmean:        91.700 cm/s LVOT VTI:          0.276 m LVOT/AV VTI ratio: 0.69  AORTA Ao Root diam: 3.40 cm MITRAL VALVE MV Area (PHT): 2.16 cm     SHUNTS MV Area VTI:   1.20 cm  Systemic VTI:  0.28 m MV Peak grad:  10.9 mmHg    Systemic Diam: 1.80 cm MV Mean grad:  3.0 mmHg MV Vmax:       1.65 m/s MV Vmean:      72.1 cm/s MV Decel Time: 352 msec MV E velocity: 141.00 cm/s MV A velocity: 83.20 cm/s MV E/A ratio:  1.69 Carlyle Dolly MD Electronically signed by Carlyle Dolly MD Signature Date/Time: 04/25/2021/2:50:21 PM    Final     SIGNED: Deatra James, MD, FHM. [facility],  Pager (please use [email or web address] to page/text) Please use Epic Secure Chat for non-urgent communication (7AM-7PM)  If 7PM-7AM, please contact night-coverage [email or web address], 04/27/2021, 11:00 AM

## 2021-04-28 DIAGNOSIS — G9341 Metabolic encephalopathy: Secondary | ICD-10-CM | POA: Diagnosis not present

## 2021-04-28 LAB — GLUCOSE, CAPILLARY
Glucose-Capillary: 207 mg/dL — ABNORMAL HIGH (ref 70–99)
Glucose-Capillary: 181 mg/dL — ABNORMAL HIGH (ref 70–99)
Glucose-Capillary: 302 mg/dL — ABNORMAL HIGH (ref 70–99)

## 2021-04-28 LAB — BASIC METABOLIC PANEL
Anion gap: 4 — ABNORMAL LOW (ref 5–15)
BUN: 39 mg/dL — ABNORMAL HIGH (ref 8–23)
CO2: 29 mmol/L (ref 22–32)
Calcium: 9 mg/dL (ref 8.9–10.3)
Chloride: 108 mmol/L (ref 98–111)
Creatinine, Ser: 2.04 mg/dL — ABNORMAL HIGH (ref 0.44–1.00)
GFR, Estimated: 24 mL/min — ABNORMAL LOW (ref >60)
Glucose, Bld: 183 mg/dL — ABNORMAL HIGH (ref 70–99)
Potassium: 4.4 mmol/L (ref 3.5–5.1)
Sodium: 141 mmol/L (ref 135–145)

## 2021-04-28 LAB — CBC
HCT: 28.7 % — ABNORMAL LOW (ref 36.0–46.0)
Hemoglobin: 8.7 g/dL — ABNORMAL LOW (ref 12.0–15.0)
MCH: 27.9 pg (ref 26.0–34.0)
MCHC: 30.3 g/dL (ref 30.0–36.0)
MCV: 92 fL (ref 80.0–100.0)
Platelets: 290 10*3/uL (ref 150–400)
RBC: 3.12 MIL/uL — ABNORMAL LOW (ref 3.87–5.11)
RDW: 15.1 % (ref 11.5–15.5)
WBC: 8.6 10*3/uL (ref 4.0–10.5)
nRBC: 0 % (ref 0.0–0.2)

## 2021-04-28 LAB — RENAL FUNCTION PANEL
Albumin: 3.3 g/dL — ABNORMAL LOW (ref 3.5–5.0)
Anion gap: 5 (ref 5–15)
BUN: 38 mg/dL — ABNORMAL HIGH (ref 8–23)
CO2: 28 mmol/L (ref 22–32)
Calcium: 9.1 mg/dL (ref 8.9–10.3)
Chloride: 108 mmol/L (ref 98–111)
Creatinine, Ser: 2 mg/dL — ABNORMAL HIGH (ref 0.44–1.00)
GFR, Estimated: 24 mL/min — ABNORMAL LOW (ref >60)
Glucose, Bld: 186 mg/dL — ABNORMAL HIGH (ref 70–99)
Phosphorus: 3.4 mg/dL (ref 2.5–4.6)
Potassium: 4.4 mmol/L (ref 3.5–5.1)
Sodium: 141 mmol/L (ref 135–145)

## 2021-04-28 MED ORDER — SENNOSIDES-DOCUSATE SODIUM 8.6-50 MG PO TABS: 2.0000 | ORAL_TABLET | Freq: Two times a day (BID) | ORAL | 0 refills | Status: AC

## 2021-04-28 NOTE — Progress Notes (Signed)
Patient ID: [name], female   DOB: July 30, 1934, 86 y.o.   MRN: [id number] S: Feeling better, no complaints.  O:BP (!) 157/60 (BP Location: Right Arm)   Pulse 80   Temp 98.3 F (36.8 C) (Oral)   Resp 18   Ht 5\' 4"  (1.626 m)   Wt 94.5 kg   SpO2 99%   BMI 35.76 kg/m   Intake/Output Summary (Last 24 hours) at 04/28/2021 0848 Last data filed at 04/28/2021 0811 Gross per 24 hour  Intake 596 ml  Output 2500 ml  Net -1904 ml   Intake/Output: I/O last 3 completed shifts: In: 480 [P.O.:480] Out: 2700 [Urine:2700]  Intake/Output this shift:  Total I/O In: 236 [P.O.:236] Out: 600 [Urine:600] Weight change:  Gen:NAD CVS: RRR Resp: CTA Abd: +BS, soft, NT/ND Ext: no edema  Recent Labs  Lab 04/24/21 1815 04/25/21 0155 04/25/21 0416 04/26/21 0352 04/27/21 0412 04/27/21 0734 04/28/21 0410  NA 138 134* 134* 137 139 139 141  141  K 5.2* 6.2* 5.8* 4.6 4.8 4.7 4.4  4.4  CL 107 105 104 107 108 108 108  108  CO2 21* 20* 22 23 28 23 28  29   GLUCOSE 194* 430* 339* 159* 207* 235* 186*  183*  BUN 82* 82* 80* 69* 49* 47* 38*  39*  CREATININE 4.12* 4.31* 4.21* 3.50* 2.37* 2.26* 2.00*  2.04*  ALBUMIN 3.3* 3.2* 3.0* 3.1* 3.0* 3.5 3.3*  CALCIUM 8.5* 8.2* 8.0* 8.2* 8.5* 8.8* 9.1  9.0  PHOS  --   --   --  4.1 3.8  --  3.4  AST 17 28 25   --   --  13*  --   ALT 14 22 21   --   --  17  --    Liver Function Tests: Recent Labs  Lab 04/25/21 0155 04/25/21 0416 04/26/21 0352 04/27/21 0412 04/27/21 0734 04/28/21 0410  AST 28 25  --   --  13*  --   ALT 22 21  --   --  17  --   ALKPHOS 55 54  --   --  59  --   BILITOT 0.2* 0.1*  --   --  0.4  --   PROT 6.9 6.7  --   --  7.8  --   ALBUMIN 3.2* 3.0*   < > 3.0* 3.5 3.3*   < > = values in this interval not displayed.   No results for input(s): LIPASE, AMYLASE in the last 168 hours. Recent Labs  Lab 04/24/21 2248  AMMONIA 19   CBC: Recent Labs  Lab 04/24/21 1815 04/28/21 0410  WBC 6.8 8.6  NEUTROABS 4.9  --   HGB 8.5*  8.7*  HCT 27.5* 28.7*  MCV 92.3 92.0  PLT 274 290   Cardiac Enzymes: No results for input(s): CKTOTAL, CKMB, CKMBINDEX, TROPONINI in the last 168 hours. CBG: Recent Labs  Lab 04/27/21 0729 04/27/21 1118 04/27/21 1652 04/27/21 2040 04/28/21 0726  GLUCAP 207* 218* 238* 227* 181*    Iron Studies:  Recent Labs    04/26/21 0353  IRON 29  TIBC 263  FERRITIN 156   Studies/Results: DG CHEST PORT 1 VIEW  Result Date: 04/27/2021 CLINICAL DATA:  Shortness of breath EXAM: PORTABLE CHEST 1 VIEW COMPARISON:  04/24/2021 FINDINGS: Stable cardiomegaly and central pulmonary vascular congestion. Mild interstitial edema. No significant pleural effusion. Left basilar atelectasis. IMPRESSION: Cardiomegaly and central pulmonary vascular congestion with mild interstitial edema. Electronically Signed   By: Macy Mis  M.D.   On: 04/27/2021 09:13    amLODipine  10 mg Oral Daily   darbepoetin (ARANESP) injection - NON-DIALYSIS  150 mcg Subcutaneous Once   famotidine  20 mg Oral Daily   heparin injection (subcutaneous)  5,000 Units Subcutaneous Q8H   hydrALAZINE  100 mg Oral TID   insulin aspart  0-15 Units Subcutaneous TID WC   insulin aspart  0-5 Units Subcutaneous QHS   insulin detemir  20 Units Subcutaneous QHS   polyethylene glycol  17 g Oral Daily   senna-docusate  2 tablet Oral BID   simvastatin  20 mg Oral QHS    BMET    Component Value Date/Time   NA 141 04/28/2021 0410   NA 141 04/28/2021 0410   K 4.4 04/28/2021 0410   K 4.4 04/28/2021 0410   CL 108 04/28/2021 0410   CL 108 04/28/2021 0410   CO2 28 04/28/2021 0410   CO2 29 04/28/2021 0410   GLUCOSE 186 (H) 04/28/2021 0410   GLUCOSE 183 (H) 04/28/2021 0410   BUN 38 (H) 04/28/2021 0410   BUN 39 (H) 04/28/2021 0410   CREATININE 2.00 (H) 04/28/2021 0410   CREATININE 2.04 (H) 04/28/2021 0410   CALCIUM 9.1 04/28/2021 0410   CALCIUM 9.0 04/28/2021 0410   GFRNONAA 24 (L) 04/28/2021 0410   GFRNONAA 24 (L) 04/28/2021 0410    GFRAA 25 (L) 02/12/2020 0653   CBC    Component Value Date/Time   WBC 8.6 04/28/2021 0410   RBC 3.12 (L) 04/28/2021 0410   HGB 8.7 (L) 04/28/2021 0410   HCT 28.7 (L) 04/28/2021 0410   PLT 290 04/28/2021 0410   MCV 92.0 04/28/2021 0410   MCH 27.9 04/28/2021 0410   MCHC 30.3 04/28/2021 0410   RDW 15.1 04/28/2021 0410   LYMPHSABS 1.3 04/24/2021 1815   MONOABS 0.5 04/24/2021 1815   EOSABS 0.2 04/24/2021 1815   BASOSABS 0.0 04/24/2021 1815     Assessment/Plan:  AKI/CKD stage IIIb - baseline Scr 1.6-2.  AKI presumably due to bactrim.  Scr is returning to baseline even with IV diuresis overnight.  Agree with resuming her outpatient torsemide dose and avoid Bactrim in the future.  Stable from a renal standpoint for discharge and will arrange for outpatient follow up with our [place] office in 3-4 weeks AMS - likely due to gabapentin toxicity (due to AKI).  AA&O X 3 today.  Renal dose gabapentin and follow. DM - per primary HTN/Volume - had some SOB over the weekend that responded to IV lasix.  Resume home dose of torsemide.  BP stable for age. Hyperkalemia - due to AKI, Bactrim, and K-supplementation.  Improved with lokelma.   Disposition - stable for discharge from a renal standpoint. Will sign off and arrange for outpatient f/u with our [place] clinic.  Donetta Potts, MD [employer] [phone]

## 2021-04-28 NOTE — Discharge Summary (Signed)
Physician Discharge [name]    Patient: [name]                   Admit date: 04/24/2021   DOB: [birth year]             Discharge date:04/28/2021/9:17 AM WIO:[id number]                          In 1 week: [name], MD  Disposition:     HOME with Home Health   Recommendations for Outpatient Follow-up:   Follow up: PCP within 1-2 weeks Follow with palliative care as soon as possible. Home health has been arranged, fall precaution, increase activity, Monitor blood sugar QA CHS if possible Daily weight if possible (severe generalized versus, needs assist with all ADLs) Follow-up with home health Follow-up with nephrologist within 1 to 2 weeks.  Discharge Condition: Stable   Code Status:   Code Status: Full Code  Diet recommendation: Diabetic diet   Discharge Diagnoses:    Principal Problem:   Acute metabolic encephalopathy Active Problems:   AKI (acute kidney injury) ([place])   Hypothermia   DM II (diabetes mellitus, type II), controlled ([place])   Hypertension   Acute respiratory failure with hypoxemia ([place])   Type 2 diabetes mellitus ([place])   Atrial fibrillation, chronic ([place])   Mild protein-calorie malnutrition ([place])   History of Present Illness/ Hospital Course [name]:    [name]  is a 86 y.o. female, with history of anemia, chronic diastolic heart failure, CKD, essential hypertension, GERD, brain hemorrhage, paroxysmal atrial fibrillation, type 2 diabetes mellitus, presents ED with chief complaint of hypoglycemia, and confusion.   ED: Hemodynamically stable,No leukocytosis, hemoglobin 8.5, platelets 274, Bicarb is low at 21, hemoglobin 194, Albumin 3.3, Lactic acid 1.9 Negative respiratory panel UA : Rare bacteria WBC 0-5, small leukocytosis Blood cultures pending, chest x-ray shows cardiomegaly with central pulmonary vascular congestion and left basilar atelectasis EKG shows 50 to accelerated junctional rhythm QTC 510      [name]       Acute metabolic encephalopathy -Resolved back to baseline   -Likely due to hypoglycemia, medication such as high-dose Neurontin, uremia, -Blood sugars recorded as low as 40s on admission -CBGs 195, 159, 160 this a.m. -Ruled out infection, SIRS, sepsis, UA negative, chest x-ray clear, mild vascular congestion, -Blood sugars improved, mentation has returned to baseline -Head CT revealing chronic changes, negative for any acute abnormality -Continue monitoring closely   Acute on chronic renal disease IIIa -BUN 80/creatinine 4.21 >>> 3.50 >> 2.26, 2.00 -GFR 10, BUN 49, 47, -Baseline creatinine 1.52, was 1.58 on 02/28/2021 -Urinalysis consistent with proteinuria -Renal ultrasound reviewed -Nephrologist was consulted, appreciate input, and follow-up as an outpatient     Hypertension -Hypertensive this morning, resuming home medication of Norvasc, hydralazine,   Shortness of breath,  -Satting 100% on 2 L of oxygen -chest x-ray revealed, cardiomegaly congestion -Initiate IV Lasix 40 mg daily(home medication torsemide 20 mg twice daily)   Euvolemic -Shortness of breath chest x-ray consistent with some congestion Mild lower extremity edema     Uncontrolled diabetes mellitus type 2, hypoglycemia  -On admission CBG as low as 40s, currently 195, 191, 96 -Initiating CBG q. ACH S, with SSI coverage -Likely secondary reduced insulin clearance -Resuming home regimen, diabetic diet   Eelectrolytes: Hyperkalemia, hypermagnesemia -Likely due to AKI, home medication including Bactrim, monitoring closely, -Status post treatment with Lokelma,  -Monitoring closely  Acute on chronic respiratory failure with hypoxia -Oxygen dependent at home 2-3 L, currently on 2 L of oxygen, satting 99% -Back to baseline -Complaining shortness of breath -initiating Lasix -No history of COPD or CHF -Chest x-ray reviewed 06/28/2020, positive cardiomegaly, congestion -Monitoring  closely -2 dose of IV Lasix was given, diuresed almost 2 L Per nephrology recommendation continuing home medication of torsemide 20 mg twice daily  Transient hypothermia -Likely due to hypoglycemia -Temp improved 98.5 currently -Resolved   Severe debility/bedbound at baseline with history of CVA -PT OT consulted  -Discussed with TOC in detail... Home health has been arranged   Constipation -Continue bowel regimen, daily Senokot, as needed MiraLAX   ----------------------------------------------------------------------------------------------------------------------------------------------- Nutritional status:  The patient's BMI is: Body mass index is 36.74 kg/m. I agree with the assessment and plan as outlined ---------------------------------------------------------------------------------------------------------------------------------------- Cultures; Blood cultures   Antimicrobials: None      Consultants: Nephrology   ---------------------------------------------------------------------------------------------------------------------    Code Status:   Code Status: Full Code   Family Communication: Daughter -updated The above findings and plan of care has been discussed with patient (and family)  in detail,  they expressed understanding and agreement of above. -Advance care planning has been discussed.    Disposition: Discharging home with home health Patient is total care, needs assistant with all ADLs Prognosis poor due to comorbidities -recommended follow-up with palliative care team.    Discharge Instructions:   Discharge Instructions     Activity as tolerated - No restrictions   Complete by: As directed    Call MD for:  difficulty breathing, headache or visual disturbances   Complete by: As directed    Call MD for:  extreme fatigue   Complete by: As directed    Call MD for:  persistant dizziness or light-headedness   Complete by: As directed    Call  MD for:  redness, tenderness, or signs of infection (pain, swelling, redness, odor or green/yellow discharge around incision site)   Complete by: As directed    Call MD for:  temperature >100.4   Complete by: As directed    Diet - low sodium heart healthy   Complete by: As directed    Discharge instructions   Complete by: As directed    Recommending follow-up with a PCP within 1 week, recommending referral to palliative care   Increase activity slowly   Complete by: As directed         Medication List     STOP taking these medications    amLODipine 10 MG tablet Commonly known as: NORVASC   gabapentin 300 MG capsule Commonly known as: NEURONTIN   potassium chloride 10 MEQ tablet Commonly known as: KLOR-CON M   sulfamethoxazole-trimethoprim 800-160 MG tablet Commonly known as: BACTRIM DS       TAKE these medications    acetaminophen 325 MG tablet Commonly known as: TYLENOL Take 2 tablets (650 mg total) by mouth every 6 (six) hours as needed for mild pain, fever or headache (or Fever >/= 101).   albuterol 108 (90 Base) MCG/ACT inhaler Commonly known as: VENTOLIN HFA Inhale 2 puffs into the lungs every 6 (six) hours as needed for wheezing or shortness of breath.   carvedilol 12.5 MG tablet Commonly known as: COREG Take 1 tablet (12.5 mg total) by mouth 2 (two) times daily with a meal.   DULoxetine 30 MG capsule Commonly known as: CYMBALTA Take 1 capsule (30 mg total) by mouth daily.   Eliquis 2.5 MG Tabs tablet Generic drug:  apixaban Take 2.5 mg by mouth 2 (two) times daily.   famotidine 20 MG tablet Commonly known as: PEPCID Take 20 mg by mouth daily.   hydrALAZINE 100 MG tablet Commonly known as: APRESOLINE Take 1 tablet (100 mg total) by mouth 3 (three) times daily.   hydrocortisone 2.5 % rectal cream Commonly known as: ANUSOL-HC Place 1 application rectally 2 (two) times daily as needed for hemorrhoids or anal itching.   insulin aspart 100 UNIT/ML  FlexPen Commonly known as: NOVOLOG Inject 0-10 Units into the skin 3 (three) times daily with meals. insulin aspart (novoLOG) injection 0-10 Units 0-10 Units Subcutaneous, 3 times daily with meals CBG < 70: Implement Hypoglycemia Standing Orders and refer to Hypoglycemia Standing Orders sidebar report  CBG 70 - 120: 0 unit CBG 121 - 150: 0 unit  CBG 151 - 200: 1 unit CBG 201 - 250: 2 units CBG 251 - 300: 4 units CBG 301 - 350: 6 units  CBG 351 - 400: 8 units  CBG > 400: 10 units   insulin detemir 100 UNIT/ML injection Commonly known as: LEVEMIR Inject 0.22 mLs (22 Units total) into the skin 2 (two) times daily.   pantoprazole 40 MG tablet Commonly known as: PROTONIX Take 1 tablet by mouth daily.   potassium chloride 10 MEQ tablet Commonly known as: KLOR-CON Take 10 mEq by mouth 2 (two) times daily.   senna-docusate 8.6-50 MG tablet Commonly known as: Senokot-S Take 2 tablets by mouth 2 (two) times daily.   simvastatin 20 MG tablet Commonly known as: ZOCOR Take 20 mg by mouth at bedtime.   torsemide 20 MG tablet Commonly known as: DEMADEX Take 1 tablet (20 mg total) by mouth 2 (two) times daily.        No Known Allergies   Quit smoking:  It is highly recommended that all people especially deals with diabetes to quit smoking or stay away from smoking, avoid secondhand smoking.   Vaccines:  Also highly recommended update your vaccines requirement, including SARS-CoV-2 , yearly  flu vaccine and pneumonia vaccine at least every 5 years.      Exercise: If you are able: 30 -60 minutes a day, 4 days a week, or 150 minutes a week.  The longer the better.  Combine stretch, strength, and aerobic activities.  If you were told in the past that you have high risk for cardiovascular diseases, you may seek evaluation by your heart doctor prior to initiating moderate to intense exercise programs.    One other important lifestyle recommendation is to ensure adequate sleep - at least 6-7 hours  of uninterrupted sleep at night.  Procedures /Studies:   CT HEAD WO CONTRAST (5MM)  Result Date: 04/25/2021 CLINICAL DATA:  Mental status change confusion. EXAM: CT HEAD WITHOUT CONTRAST TECHNIQUE: Contiguous axial images were obtained from the base of the skull through the vertex without intravenous contrast. COMPARISON:  09/24/2020 FINDINGS: Brain: Ventricle size and cerebral volume normal. Moderate white matter hypodensity bilaterally, chronic and unchanged from prior MRI. Chronic infarct right cerebellum. Negative for acute infarct, hemorrhage, mass Vascular: Negative for hyperdense vessel. Atherosclerotic calcification. Skull: Negative Sinuses/Orbits: Paranasal sinuses clear. Bilateral cataract extraction Other: None IMPRESSION: No acute abnormality Chronic ischemic changes are stable from prior studies. Electronically Signed   By: Franchot Gallo M.D.   On: 04/25/2021 11:39   US RENAL  Result Date: 04/25/2021 CLINICAL DATA:  In a 86 year old female presents for evaluation of acute kidney injury. EXAM: RENAL / URINARY TRACT ULTRASOUND COMPLETE  COMPARISON:  Comparison with CT imaging from December 26, 2019. FINDINGS: Right Kidney: Renal measurements: 10.6 x 4.3 x 4.8 cm = volume: 116.1 mL. Increased cortical echogenicity without hydronephrosis or gross lesion or nephrolithiasis. Signs of cortical scarring. Left Kidney: Renal measurements: 8.7 x 4.9 x 5.4 cm = volume: 119 mL. Increased cortical echogenicity without hydronephrosis, visible calculus or gross lesion. Signs of cortical scarring. Bladder: Appears normal for degree of bladder distention. Other: Study limited by patient body habitus IMPRESSION: Mild increased cortical echogenicity with signs of cortical scarring. No hydronephrosis. Exam limited by body habitus. Electronically Signed   By: Zetta Bills M.D.   On: 04/25/2021 11:44   DG CHEST PORT 1 VIEW  Result Date: 04/27/2021 CLINICAL DATA:  Shortness of breath EXAM: PORTABLE CHEST 1 VIEW  COMPARISON:  04/24/2021 FINDINGS: Stable cardiomegaly and central pulmonary vascular congestion. Mild interstitial edema. No significant pleural effusion. Left basilar atelectasis. IMPRESSION: Cardiomegaly and central pulmonary vascular congestion with mild interstitial edema. Electronically Signed   By: Macy Mis M.D.   On: 04/27/2021 09:13   DG Chest Port 1 View  Result Date: 04/24/2021 CLINICAL DATA:  Shortness of breath. EXAM: PORTABLE CHEST 1 VIEW COMPARISON:  Chest x-ray 02/24/2021.  CT chest 07/23/2019. FINDINGS: The heart is enlarged. There central pulmonary vascular congestion. There is minimal left base atelectasis. Costophrenic angles are clear. There is no pneumothorax or acute fracture. IMPRESSION: 1. Cardiomegaly with central pulmonary vascular congestion. 2. Left basilar atelectasis. Electronically Signed   By: Ronney Asters M.D.   On: 04/24/2021 21:20   ECHOCARDIOGRAM COMPLETE  Result Date: 04/25/2021    ECHOCARDIOGRAM REPORT   Patient Name:   FUMIYE LUBBEN Date of Exam: 04/25/2021 Medical Rec #:  [id number]    Height:       64.0 in Accession #:    [id number]   Weight:       214.1 lb Date of Birth:  19-Aug-1934     BSA:          2.014 m Patient Age:    27 years     BP:           134/59 mmHg Patient Gender: F            HR:           69 bpm. Exam Location:  [facility] Procedure: 2D Echo, Cardiac Doppler and Color Doppler Indications:    Dyspnea R06.00  History:        Patient has prior history of Echocardiogram examinations, most                 recent 02/24/2021. CHF, Stroke, Arrythmias:Atrial Fibrillation;                 Risk Factors:Hypertension and Dyslipidemia. Hx of COVID-19                 infection.  Sonographer:    Alvino Chapel RCS Referring Phys: [id number] ASIA B [place]  1. Left ventricular ejection fraction, by estimation, is 65 to 70%. The left ventricle has normal function. The left ventricle has no regional wall motion abnormalities. There is mild left  ventricular hypertrophy. Left ventricular diastolic parameters are consistent with Grade II diastolic dysfunction (pseudonormalization). Elevated left atrial pressure.  2. Right ventricular systolic function is normal. The right ventricular size is normal. Tricuspid regurgitation signal is inadequate for assessing PA pressure.  3. Left atrial size was mildly dilated.  4. The mitral valve is normal in  structure. No evidence of mitral valve regurgitation. No evidence of mitral stenosis.  5. The aortic valve is tricuspid. Aortic valve regurgitation is not visualized. No aortic stenosis is present.  6. The inferior vena cava is dilated in size with <50% respiratory variability, suggesting right atrial pressure of 15 mmHg. FINDINGS  Left Ventricle: Left ventricular ejection fraction, by estimation, is 65 to 70%. The left ventricle has normal function. The left ventricle has no regional wall motion abnormalities. Definity contrast agent was given IV to delineate the left ventricular  endocardial borders. The left ventricular internal cavity size was normal in size. There is mild left ventricular hypertrophy. Left ventricular diastolic parameters are consistent with Grade II diastolic dysfunction (pseudonormalization). Elevated left atrial pressure. Right Ventricle: The right ventricular size is normal. No increase in right ventricular wall thickness. Right ventricular systolic function is normal. Tricuspid regurgitation signal is inadequate for assessing PA pressure. Left Atrium: Left atrial size was mildly dilated. Right Atrium: Right atrial size was normal in size. Pericardium: There is no evidence of pericardial effusion. Mitral Valve: The mitral valve is normal in structure. No evidence of mitral valve regurgitation. No evidence of mitral valve stenosis. MV peak gradient, 10.9 mmHg. The mean mitral valve gradient is 3.0 mmHg. Tricuspid Valve: The tricuspid valve is normal in structure. Tricuspid valve regurgitation is  not demonstrated. No evidence of tricuspid stenosis. Aortic Valve: The aortic valve is tricuspid. Aortic valve regurgitation is not visualized. No aortic stenosis is present. Aortic valve mean gradient measures 8.0 mmHg. Aortic valve peak gradient measures 13.8 mmHg. Aortic valve area, by VTI measures 1.76  cm. Pulmonic Valve: The pulmonic valve was not well visualized. Pulmonic valve regurgitation is not visualized. No evidence of pulmonic stenosis. Aorta: The aortic root is normal in size and structure. Venous: The inferior vena cava is dilated in size with less than 50% respiratory variability, suggesting right atrial pressure of 15 mmHg. IAS/Shunts: No atrial level shunt detected by color flow Doppler.  LEFT VENTRICLE PLAX 2D LVIDd:         4.80 cm   Diastology LVIDs:         3.10 cm   LV e' medial:    5.77 cm/s LV PW:         1.20 cm   LV E/e' medial:  24.4 LV IVS:        1.20 cm   LV e' lateral:   6.31 cm/s LVOT diam:     1.80 cm   LV E/e' lateral: 22.3 LV SV:         70 LV SV Index:   35 LVOT Area:     2.54 cm  RIGHT VENTRICLE RV S prime:     15.40 cm/s TAPSE (M-mode): 2.0 cm LEFT ATRIUM             Index        RIGHT ATRIUM           Index LA diam:        3.60 cm 1.79 cm/m   RA Area:     24.30 cm LA Vol (A2C):   96.0 ml 47.67 ml/m  RA Volume:   78.30 ml  38.88 ml/m LA Vol (A4C):   69.3 ml 34.41 ml/m LA Biplane Vol: 84.4 ml 41.91 ml/m  AORTIC VALVE AV Area (Vmax):    1.79 cm AV Area (Vmean):   1.85 cm AV Area (VTI):     1.76 cm AV Vmax:  186.00 cm/s AV Vmean:          126.000 cm/s AV VTI:            0.399 m AV Peak Grad:      13.8 mmHg AV Mean Grad:      8.0 mmHg LVOT Vmax:         131.00 cm/s LVOT Vmean:        91.700 cm/s LVOT VTI:          0.276 m LVOT/AV VTI ratio: 0.69  AORTA Ao Root diam: 3.40 cm MITRAL VALVE MV Area (PHT): 2.16 cm     SHUNTS MV Area VTI:   1.20 cm     Systemic VTI:  0.28 m MV Peak grad:  10.9 mmHg    Systemic Diam: 1.80 cm MV Mean grad:  3.0 mmHg MV Vmax:        1.65 m/s MV Vmean:      72.1 cm/s MV Decel Time: 352 msec MV E velocity: 141.00 cm/s MV A velocity: 83.20 cm/s MV E/A ratio:  1.69 Carlyle Dolly MD Electronically signed by Carlyle Dolly MD Signature Date/Time: 04/25/2021/2:50:21 PM    Final     Subjective:   Patient was seen and examined 04/28/2021, 9:17 AM Patient stable today. No acute distress.  No issues overnight Stable for discharge.  Discharge Exam:    Vitals:   04/27/21 0845 04/27/21 1437 04/27/21 2040 04/28/21 0405  BP: (!) 190/73 (!) 151/61 (!) 173/67 (!) 157/60  Pulse:  80 80 80  Resp:  20 20 18   Temp:  98.8 F (37.1 C) 98.3 F (36.8 C) 98.3 F (36.8 C)  TempSrc:   Oral Oral  SpO2:  100% 98% 99%  Weight:    94.5 kg  Height:        General: Pt lying comfortably in bed & appears in no obvious distress. Cardiovascular: S1 & S2 heard, RRR, S1/S2 +. No murmurs, rubs, gallops or clicks. No JVD or pedal edema. Respiratory: Clear to auscultation without wheezing, rhonchi or crackles. No increased work of breathing. Abdominal:  Non-distended, non-tender & soft. No organomegaly or masses appreciated. Normal bowel sounds heard. CNS: Alert and oriented. No focal deficits. Extremities: no edema, no cyanosis      The results of significant diagnostics from this hospitalization (including imaging, microbiology, ancillary and laboratory) are listed below for reference.      Microbiology:   Recent Results (from the past 240 hour(s))  Culture, blood (routine x 2)     Status: None (Preliminary result)   Collection Time: 04/24/21  6:15 PM   Specimen: BLOOD LEFT HAND  Result Value Ref Range Status   Specimen Description BLOOD LEFT HAND  Final   Special Requests   Final    BOTTLES DRAWN AEROBIC AND ANAEROBIC Blood Culture adequate volume   Culture   Final    NO GROWTH 4 DAYS Performed at [facility], [address], [place], [place] [address]    Report Status PENDING  Incomplete  Culture, blood (routine x 2)      Status: None (Preliminary result)   Collection Time: 04/24/21  6:15 PM   Specimen: BLOOD RIGHT HAND  Result Value Ref Range Status   Specimen Description BLOOD RIGHT HAND  Final   Special Requests   Final    BOTTLES DRAWN AEROBIC AND ANAEROBIC Blood Culture adequate volume   Culture   Final    NO GROWTH 4 DAYS Performed at [facility], [address]  [place], [place] [address]    Report Status PENDING  Incomplete  Resp Panel by RT-PCR (Flu A&B, Covid) Nasopharyngeal Swab     Status: None   Collection Time: 04/24/21  8:14 PM   Specimen: Nasopharyngeal Swab; Nasopharyngeal(NP) swabs in vial transport medium  Result Value Ref Range Status   SARS Coronavirus 2 by RT PCR NEGATIVE NEGATIVE Final    Comment: (NOTE) SARS-CoV-2 target nucleic acids are NOT DETECTED.  The SARS-CoV-2 RNA is generally detectable in upper respiratory specimens during the acute phase of infection. The lowest concentration of SARS-CoV-2 viral copies this assay can detect is 138 copies/mL. A negative result does not preclude SARS-Cov-2 infection and should not be used as the sole basis for treatment or other patient management decisions. A negative result may occur with  improper specimen collection/handling, submission of specimen other than nasopharyngeal swab, presence of viral mutation(s) within the areas targeted by this assay, and inadequate number of viral copies(<138 copies/mL). A negative result must be combined with clinical observations, patient history, and epidemiological information. The expected result is Negative.  Fact Sheet for Patients:  EntrepreneurPulse.com.au  Fact Sheet for Healthcare Providers:  IncredibleEmployment.be  This test is no t yet approved or cleared by the [place] FDA and  has been authorized for detection and/or diagnosis of SARS-CoV-2 by FDA under an Emergency Use Authorization (EUA). This EUA will remain  in effect (meaning  this test can be used) for the duration of the COVID-19 declaration under Section 564(b)(1) of the Act, 21 U.S.C.section 360bbb-3(b)(1), unless the authorization is terminated  or revoked sooner.       Influenza A by PCR NEGATIVE NEGATIVE Final   Influenza B by PCR NEGATIVE NEGATIVE Final    Comment: (NOTE) The Xpert Xpress SARS-CoV-2/FLU/RSV plus assay is intended as an aid in the diagnosis of influenza from Nasopharyngeal swab specimens and should not be used as a sole basis for treatment. Nasal washings and aspirates are unacceptable for Xpert Xpress SARS-CoV-2/FLU/RSV testing.  Fact Sheet for Patients: EntrepreneurPulse.com.au  Fact Sheet for Healthcare Providers: IncredibleEmployment.be  This test is not yet approved or cleared by the [place] FDA and has been authorized for detection and/or diagnosis of SARS-CoV-2 by FDA under an Emergency Use Authorization (EUA). This EUA will remain in effect (meaning this test can be used) for the duration of the COVID-19 declaration under Section 564(b)(1) of the Act, 21 U.S.C. section 360bbb-3(b)(1), unless the authorization is terminated or revoked.  Performed at [facility], [address] [place], [place] [address]   Urine Culture     Status: None   Collection Time: 04/24/21 10:37 PM   Specimen: Urine, Clean Catch  Result Value Ref Range Status   Specimen Description   Final    URINE, CLEAN CATCH Performed at [facility], [address], [place], [place] [address]    Special Requests   Final    NONE Performed at [facility], [address], [place], [place] [address]    Culture   Final    NO GROWTH Performed at [facility], [place] [address] [place],  [address]    Report Status 04/26/2021 FINAL  Final     Labs:   CBC: Recent Labs  Lab 04/24/21 1815 04/28/21 0410  WBC 6.8 8.6  NEUTROABS 4.9  --   HGB 8.5* 8.7*  HCT 27.5* 28.7*  MCV 92.3 92.0  PLT 274 962    Basic Metabolic Panel: Recent Labs  Lab 04/24/21 1815 04/25/21 0155  04/25/21 0416 04/26/21 0352 04/27/21 0412 04/27/21 0734 04/28/21 0410  NA 138 134* 134* 137 139 139 141  141  K 5.2* 6.2* 5.8* 4.6 4.8 4.7 4.4  4.4  CL 107 105 104 107 108 108 108  108  CO2 21* 20* 22 23 28 23 28  29   GLUCOSE 194* 430* 339* 159* 207* 235* 186*  183*  BUN 82* 82* 80* 69* 49* 47* 38*  39*  CREATININE 4.12* 4.31* 4.21* 3.50* 2.37* 2.26* 2.00*  2.04*  CALCIUM 8.5* 8.2* 8.0* 8.2* 8.5* 8.8* 9.1  9.0  MG 2.7* 2.7*  --   --   --   --   --   PHOS  --   --   --  4.1 3.8  --  3.4   Liver Function Tests: Recent Labs  Lab 04/24/21 1815 04/25/21 0155 04/25/21 0416 04/26/21 0352 04/27/21 0412 04/27/21 0734 04/28/21 0410  AST 17 28 25   --   --  13*  --   ALT 14 22 21   --   --  17  --   ALKPHOS 53 55 54  --   --  59  --   BILITOT 0.4 0.2* 0.1*  --   --  0.4  --   PROT 7.4 6.9 6.7  --   --  7.8  --   ALBUMIN 3.3* 3.2* 3.0* 3.1* 3.0* 3.5 3.3*   BNP (last 3 results) Recent Labs    09/24/20 2137 02/24/21 0855 04/25/21 0416  BNP 101.0* 150.0* 290.0*   Cardiac Enzymes: No results for input(s): CKTOTAL, CKMB, CKMBINDEX, TROPONINI in the last 168 hours. CBG: Recent Labs  Lab 04/27/21 0729 04/27/21 1118 04/27/21 1652 04/27/21 2040 04/28/21 0726  GLUCAP 207* 218* 238* 227* 181*   Hgb A1c No results for input(s): HGBA1C in the last 72 hours. Lipid Profile No results for input(s): CHOL, HDL, LDLCALC, TRIG, CHOLHDL, LDLDIRECT in the last 72 hours. Thyroid function studies No results for input(s): TSH, T4TOTAL, T3FREE, THYROIDAB in the last 72 hours.  Invalid input(s): FREET3 Anemia work up Recent Labs    04/26/21 0353  FERRITIN 156  TIBC 263  IRON 29   Urinalysis    Component Value Date/Time   COLORURINE YELLOW 04/24/2021 2005   APPEARANCEUR CLEAR 04/24/2021 2005   LABSPEC 1.015 04/24/2021 2005   PHURINE 5.5 04/24/2021 2005   GLUCOSEU NEGATIVE 04/24/2021 2005   HGBUR  NEGATIVE 04/24/2021 2005   BILIRUBINUR NEGATIVE 04/24/2021 2005   KETONESUR NEGATIVE 04/24/2021 2005   PROTEINUR NEGATIVE 04/24/2021 2005   UROBILINOGEN 0.2 02/20/2015 1200   NITRITE NEGATIVE 04/24/2021 2005   LEUKOCYTESUR SMALL (A) 04/24/2021 2005         Time coordinating discharge: Over 45 minutes  SIGNED: Deatra James, MD, FACP, FHM. [facility],  Please use [email or web address] to Page If 7PM-7AM, please contact night-coverage [email or web address],  04/28/2021, 9:17 AM

## 2021-04-28 NOTE — Progress Notes (Signed)
Inpatient Diabetes Program Recommendations  AACE/ADA: New Consensus Statement on Inpatient Glycemic Control   Target Ranges:  Prepandial:   less than 140 mg/dL      Peak postprandial:   less than 180 mg/dL (1-2 hours)      Critically ill patients:  140 - 180 mg/dL    Latest Reference Range & Units 04/27/21 04:58 04/27/21 11:18 04/27/21 16:52 04/27/21 20:40 04/28/21 07:26  Glucose-Capillary 70 - 99 mg/dL 185 (H) 218 (H) 238 (H) 227 (H) 181 (H)   Review of Glycemic Control  Diabetes history: DM2 Outpatient Diabetes medications: Levemir 22 units BID, Novolog 0-10 units TID with meals Current orders for Inpatient glycemic control: Levemir 20 units QHS, Novolog 0-15 units TID with meals, Novolog 0-5 units QHS   Inpatient Diabetes Program Recommendations:     Insulin: Please consider ordering Novolog 3 units TID with meals for meal coverage if patient eats at least 50% of meals.  Thanks, Barnie Alderman, RN, MSN, CDE Diabetes Coordinator Inpatient Diabetes Program [phone] (Team Pager from 8am to 5pm)

## 2021-04-28 NOTE — Progress Notes (Signed)
Nsg Discharge Note  Admit Date:  04/24/2021 Discharge date: 04/28/2021   [name] to be D/C'd Home per MD order.  AVS completed.  Copy for chart, and copy for patient signed, and dated. Patient/caregiver able to verbalize understanding.  Discharge Medication: Allergies as of 04/28/2021   No Known Allergies      Medication List     STOP taking these medications    Eliquis 2.5 MG Tabs tablet Generic drug: apixaban   gabapentin 300 MG capsule Commonly known as: NEURONTIN   potassium chloride 10 MEQ tablet Commonly known as: KLOR-CON M   sulfamethoxazole-trimethoprim 800-160 MG tablet Commonly known as: BACTRIM DS       TAKE these medications    acetaminophen 325 MG tablet Commonly known as: TYLENOL Take 2 tablets (650 mg total) by mouth every 6 (six) hours as needed for mild pain, fever or headache (or Fever >/= 101).   albuterol 108 (90 Base) MCG/ACT inhaler Commonly known as: VENTOLIN HFA Inhale 2 puffs into the lungs every 6 (six) hours as needed for wheezing or shortness of breath.   amLODipine 10 MG tablet Commonly known as: NORVASC Take 1 tablet (10 mg total) by mouth daily.   carvedilol 12.5 MG tablet Commonly known as: COREG Take 1 tablet (12.5 mg total) by mouth 2 (two) times daily with a meal.   DULoxetine 30 MG capsule Commonly known as: CYMBALTA Take 1 capsule (30 mg total) by mouth daily.   famotidine 20 MG tablet Commonly known as: PEPCID Take 20 mg by mouth daily.   hydrALAZINE 100 MG tablet Commonly known as: APRESOLINE Take 1 tablet (100 mg total) by mouth 3 (three) times daily.   hydrocortisone 2.5 % rectal cream Commonly known as: ANUSOL-HC Place 1 application rectally 2 (two) times daily as needed for hemorrhoids or anal itching.   insulin aspart 100 UNIT/ML FlexPen Commonly known as: NOVOLOG Inject 0-10 Units into the skin 3 (three) times daily with meals. insulin aspart (novoLOG) injection 0-10 Units 0-10 Units Subcutaneous,  3 times daily with meals CBG < 70: Implement Hypoglycemia Standing Orders and refer to Hypoglycemia Standing Orders sidebar report  CBG 70 - 120: 0 unit CBG 121 - 150: 0 unit  CBG 151 - 200: 1 unit CBG 201 - 250: 2 units CBG 251 - 300: 4 units CBG 301 - 350: 6 units  CBG 351 - 400: 8 units  CBG > 400: 10 units   insulin detemir 100 UNIT/ML injection Commonly known as: LEVEMIR Inject 0.22 mLs (22 Units total) into the skin 2 (two) times daily.   pantoprazole 40 MG tablet Commonly known as: PROTONIX Take 1 tablet by mouth daily.   potassium chloride 10 MEQ tablet Commonly known as: KLOR-CON Take 10 mEq by mouth 2 (two) times daily.   senna-docusate 8.6-50 MG tablet Commonly known as: Senokot-S Take 2 tablets by mouth 2 (two) times daily.   simvastatin 20 MG tablet Commonly known as: ZOCOR Take 20 mg by mouth at bedtime.   torsemide 20 MG tablet Commonly known as: DEMADEX Take 1 tablet (20 mg total) by mouth 2 (two) times daily.        Discharge Assessment: Vitals:   04/28/21 0405 04/28/21 1300  BP: (!) 157/60   Pulse: 80 84  Resp: 18   Temp: 98.3 F (36.8 C)   SpO2: 99% 94%   Skin clean, dry and intact without evidence of skin break down, no evidence of skin tears noted. IV catheter discontinued intact.  Site without signs and symptoms of complications - no redness or edema noted at insertion site, patient denies c/o pain - only slight tenderness at site.  Dressing with slight pressure applied.  D/c Instructions-Education: Discharge instructions given to patient/family with verbalized understanding. D/c education completed with patient/family including follow up instructions, medication list, d/c activities limitations if indicated, with other d/c instructions as indicated by MD - patient able to verbalize understanding, all questions fully answered. Patient instructed to return to ED, call 911, or call MD for any changes in condition.  Patient escorted via [place], and D/C home  via private auto.  Caitlin Mcmurray, LPN 23/[id number]/7322 0:25 PM

## 2021-04-28 NOTE — TOC Transition Note (Signed)
Transition of Care [facility]) - CM/SW Discharge Note   Patient Details  Name: LATEASHA BREUER MRN: [id number] Date of Birth: [birth year]  Transition of Care [facility]) CM/SW Contact:  Salome Arnt, LCSW Phone Number: 04/28/2021, 10:20 AM   Clinical Narrative:  Pt d/c today and will return home. Home health orders in. LCSW discussed with pt who states she has used [facility] in the past and would like them again. Marjory Lies with [facility] notified and accepts for PT, OT, RN, aide, and SW. She indicates her daughter should pick her up today. RN updated. No other needs reported by pt at this time.      Final next level of care: [place] Barriers to Discharge: Barriers Resolved   Patient Goals and CMS Choice Patient states their goals for this hospitalization and ongoing recovery are:: return home   Choice offered to / list presented to : Patient  Discharge Placement                    Patient and family notified of of transfer: 04/28/21  Discharge Plan and Services                DME Arranged: N/A         HH Arranged: RN, PT, OT, Social Work, Nurse's Aide [place] Agency: [place] Date [place]: 04/28/21 Time [place]: 52 Representative spoke with at [place]: [place] ([place]) Interventions     Readmission Risk Interventions Readmission Risk Prevention Plan 02/28/2021 02/09/2020 07/27/2019  Transportation Screening Complete Complete Complete  PCP or Specialist Appt within 3-5 Days Complete - -  Medication Review (RN CM) - - Complete  HRI or Home Care Consult Complete Complete -  Social Work Consult for [place] Planning/Counseling Complete Complete -  Palliative Care Screening Not Applicable Not Applicable -  Medication Review Press photographer) Complete Complete -  Some recent data might be hidden

## 2021-04-29 LAB — CULTURE, BLOOD (ROUTINE X 2)
Culture: NO GROWTH
Culture: NO GROWTH
Special Requests: ADEQUATE
Special Requests: ADEQUATE

## 2021-04-29 IMAGING — CT CT ANGIO CHEST
2 of 6 series · 14 of 46 positions shown · IV contrast (Omnipaque or Isovue)
Comparison: Chest radiograph 07/23/2019, CT renal colic 04/20/2019,
CT abdomen pelvis 05/06/2018

CLINICAL DATA: Shortness of breath, chest pain, abdominal pain
began at [DATE]

EXAM:
CT ANGIOGRAPHY CHEST
CT ABDOMEN AND PELVIS WITH CONTRAST
TECHNIQUE: Multidetector CT imaging of the chest was performed using the
standard protocol during bolus administration of intravenous
contrast. Multiplanar CT image reconstructions and MIPs were
obtained to evaluate the vascular anatomy. Multidetector CT imaging
of the abdomen and pelvis was performed using the standard protocol
during bolus administration of intravenous contrast.
CONTRAST:  80mL OMNIPAQUE IOHEXOL 350 MG/ML SOLN

[Series 3: pe axialthins · axial · 0.92mm/px · z∈[+1269,+1529]mm · 11 of 300 slices shown]
[im 27/300  lung]
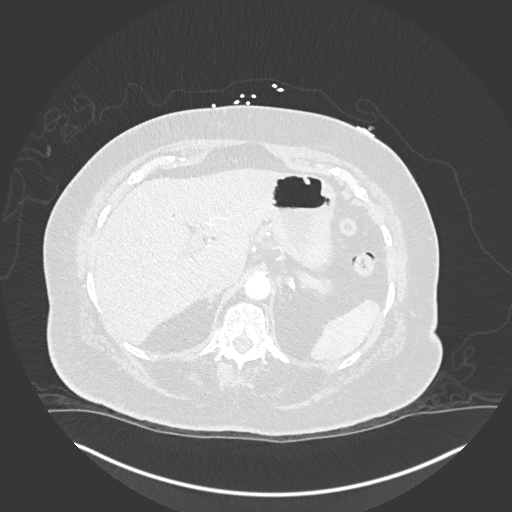
[im 53/300  soft-tissue]
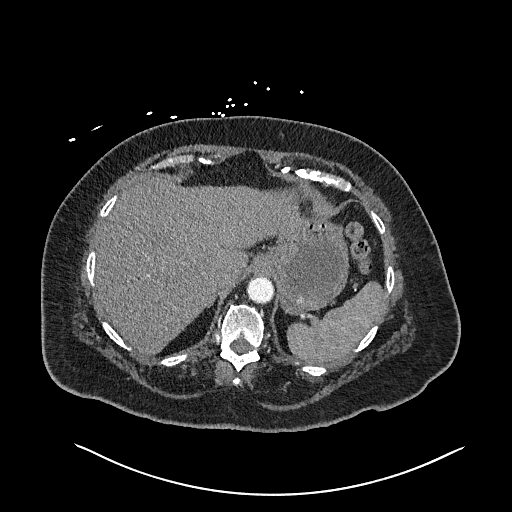
[im 79/300  lung]
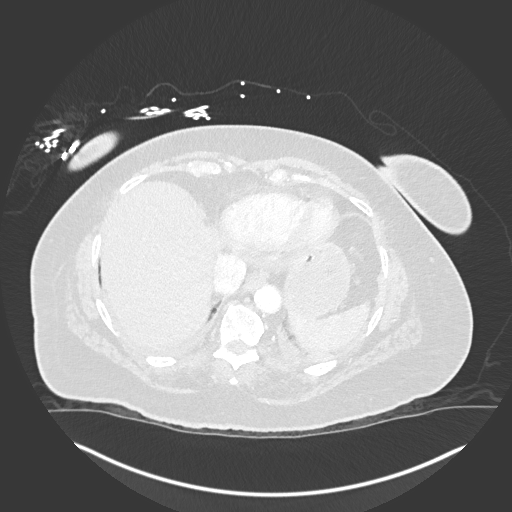
[im 105/300  soft-tissue]
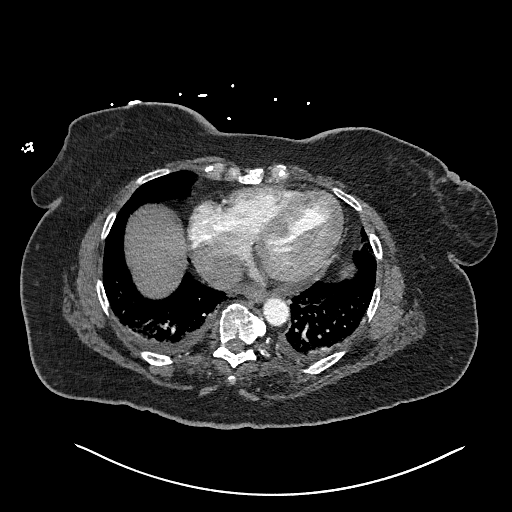
[im 131/300  lung]
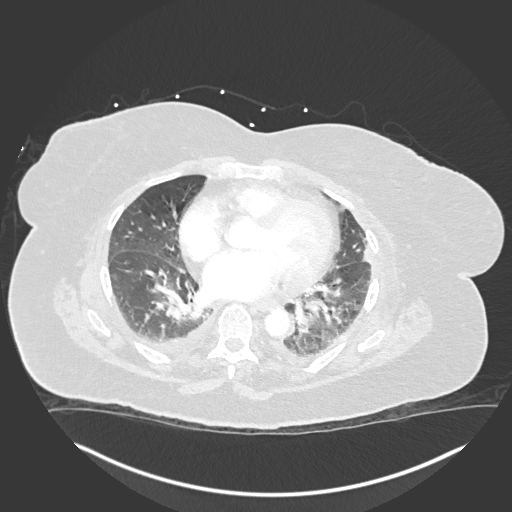
[im 157/300  soft-tissue]
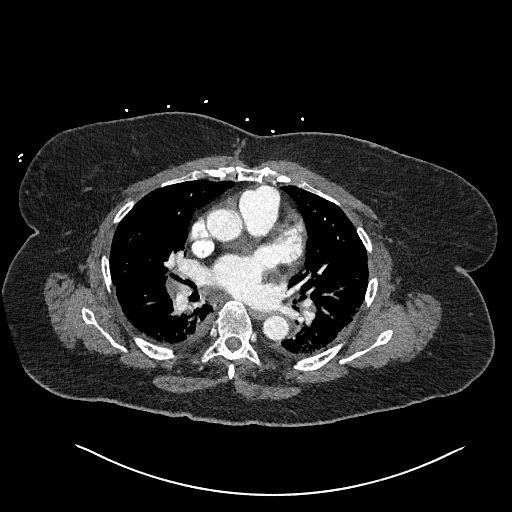
[im 183/300  lung]
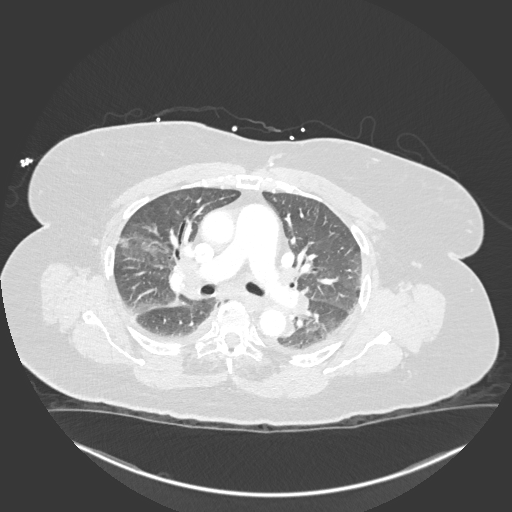
[im 209/300  soft-tissue]
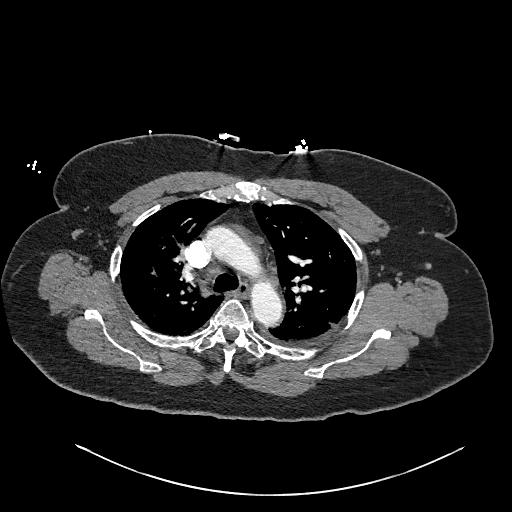
[im 235/300  lung]
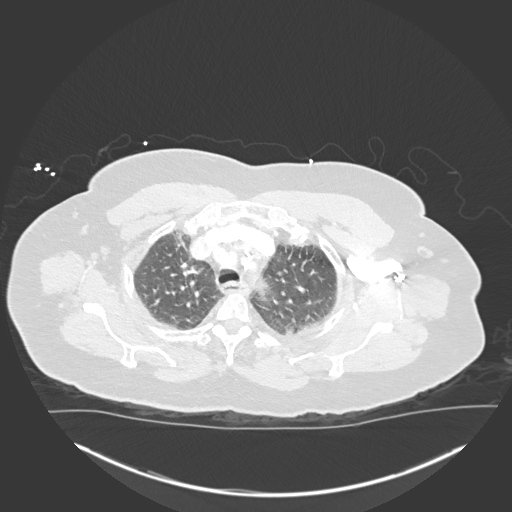
[im 261/300  soft-tissue]
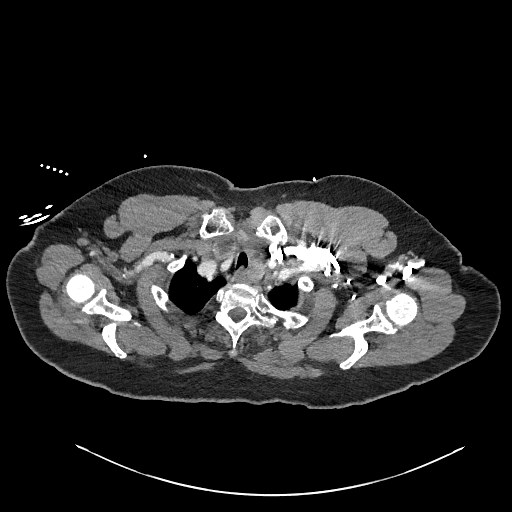
[im 287/300  lung]
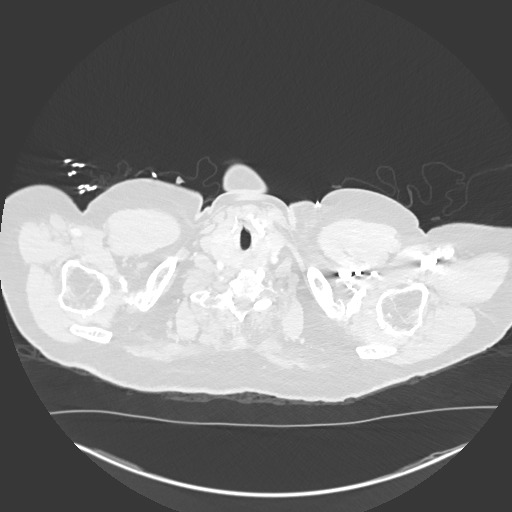

[Series 5: cor soft · coronal · 0.61mm/px · 3 of 144 slices shown]
[im 36/144  soft-tissue]
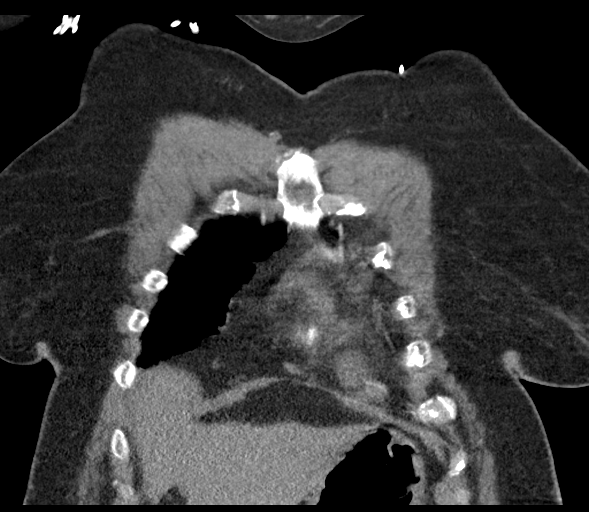
[im 72/144  soft-tissue]
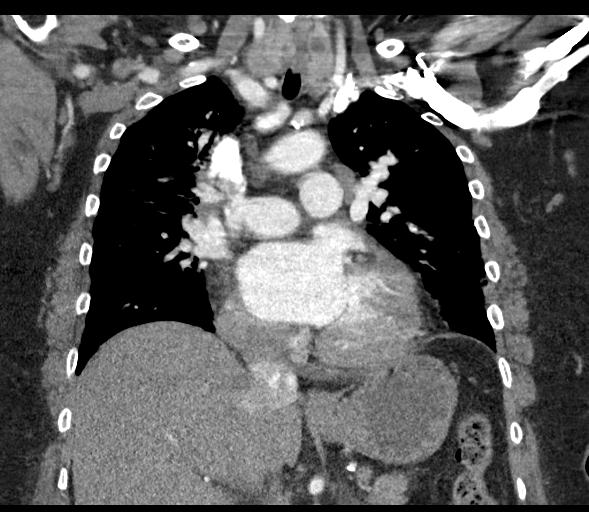
[im 108/144  soft-tissue]
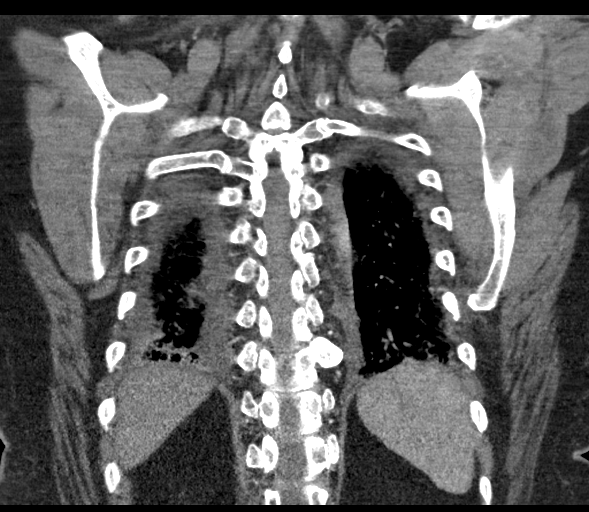

[14 of 46 positions shown; findings below may reference images not displayed]

FINDINGS: CTA CHEST FINDINGS

Cardiovascular: Satisfactory opacification the pulmonary arteries to
the segmental level. No pulmonary artery filling defects are
identified. Central pulmonary arteries are normal caliber.
Atherosclerotic plaque within the normal caliber aorta. No
intramural hematoma, dissection flap or other acute luminal
abnormality of the aorta is seen. No periaortic stranding or
hemorrhage. Atherosclerotic calcification of the coronary arteries.
Cardiac size at the upper limits of normal. Small pericardial
effusion and fluid within the pericardial recesses. Reflux of
contrast into the IVC and mildly within the hepatic veins. Remaining
major venous structures are unremarkable.

Mediastinum/Nodes: Heterogeneous enlarged and nodular thyroid gland.
Dominant nodule in the right lobe measures up to 2.5 cm ([DATE])
low-attenuation fluid is seen within the pericardial recesses. Few
borderline enlarged low-attenuation mediastinal and hilar nodes are
present instance a 10 mm left hilar node (2/86), and an 11 mm right
hilar node (2/34), and a 16 mm subcarinal node (2/39). No acute
abnormality of the trachea or esophagus. No worrisome axillary or
visible supraclavicular adenopathy.

Lungs/Pleura: Small bilateral effusions. Adjacent areas of passive
atelectasis. Interlobular septal thickening noted towards the apices
and lung bases. Redistributed pulmonary vascularity. Central airways
thickening/cuffing. Some more patchy ground-glass towards the lung
bases, favor edema.

Musculoskeletal: Diffusely thickened cortex and trabecular
coarsening of the left tenth rib with mild expansile changes. Mild
dextrocurvature of the thoracic spine. Multilevel degenerative
changes are present in the imaged portions of the spine. Additional
degenerative changes in the shoulders.

Review of the MIP images confirms the above findings.

CT ABDOMEN and PELVIS FINDINGS

Hepatobiliary: No focal liver abnormality is seen. Patient is post
cholecystectomy. Slight prominence of the biliary tree likely
related to reservoir effect. No calcified intraductal gallstones.

Pancreas: There is slight prominence of the pancreatic duct
measuring up to 5 mm the head and 3 mm at the tail/body junction.
Uniform pancreatic enhancement with mild diffuse peripancreatic
inflammation most focally upon the pancreatic head and uncinate
process. No organized or encapsulated fluid collections are seen.

Spleen: Normal in size without focal abnormality.

Adrenals/Urinary Tract: Mild nodular thickening of the adrenal
glands, similar to priors. Intermediate attenuation 1.5 cm cystic
lesion in the anterior interpolar left kidney. Additional
subcentimeter hypoattenuating renal lesions both kidneys. Renal
vascular calcium is noted. Suspect few nonobstructing calculi in the
collecting system as well. No obstructive urolithiasis or
hydronephrosis. Urinary bladder is unremarkable.

Stomach/Bowel: Distal esophagus and stomach are unremarkable. Mild
periduodenal inflammatory changes of the first through third
portions of the duodenum. Favor reactive in the absence of wall
thickening or dilatation. No distal small bowel abnormality is seen.
No evidence of obstruction. A normal appendix is visualized. No
colonic dilatation or wall thickening. Scattered colonic diverticula
without focal pericolonic inflammation to suggest diverticulitis.

Vascular/Lymphatic: Atherosclerotic plaque within the normal caliber
aorta and branch vessels. No suspicious or enlarged lymph nodes in
the included lymphatic chains.

Reproductive: Anteverted uterus. No concerning adnexal lesions.

Other: Mild ventral diastasis with a superimposed fat containing
umbilical hernia. No bowel containing hernias. Soft tissue
thickening along the anterior abdominal wall of the appearance most
compatible with localized injectable use related to insulin.

Musculoskeletal: Multilevel moderate degenerative changes of the
imaged thoracolumbar spine. More moderate to severe degenerative
changes in the SI joints and symphysis pubis. Prior right femoral
lateral plate and screw construct with transcervical pending. Some
articular surface collapse near the terminus of the transcervical
fixation screw in the superior right femoral head may reflect some
articular surface collapse or avascular necrosis.

Review of the MIP images confirms the above findings.
IMPRESSION: CTA CHEST

1. No evidence of acute pulmonary artery filling defect to suggest
pulmonary embolism.
2. Features most suggestive of likely cardiogenic pulmonary edema
with small bilateral effusions and reflux of contrast into the
hepatic veins which can suggest some right-sided heart dysfunction
or elevated right heart pressures.
3. Borderline enlarged low-attenuation mediastinal and hilar nodes,
likely reactive/edematous.
4. Heterogeneous enlarged and nodular thyroid gland. Dominant right
thyroid nodule measures 2.5 cm. Recommend further evaluation with
nonemergent thyroid ultrasound. This follows consensus guidelines:
Managing Incidental Thyroid Nodules Detected on Imaging: White Paper
of [REDACTED]. [HOSPITAL]
2136; [DATE]. and [place] 3-tiered system for managing ITNs: [HOSPITAL]. [DATE]): 143-50
5. Diffusely thickened cortex and trabecular coarsening of the left
tenth rib with mild expansile changes. This finding is favored to
represent Paget's disease, less likely Paget's disease.

CT ABDOMEN AND PELVIS

1. Mild diffuse peripancreatic inflammation compatible with acute
pancreatitis particularly given the setting of a elevated lipase. No
evidence of pancreatic necrosis, significant free fluid or organized
pancreatic collections. Paraduodenal inflammation is likely
reactive.
2. Mild pancreatic ductal dilatation, can be related to the acute
pancreatic process. Attention on follow-up imaging is recommended.
If persistently dilated, MRCP could be obtained.
3. Indeterminate intermediate attenuation 1.5 cm cyst in the left
kidney, consider outpatient, nonemergent renal ultrasound.
4. Prior right femoral transcervical fixation with some sclerotic
changes adjacent the tip of the transcervical fixation screw
extending to the articular surface of the femoral head. May reflect
avascular necrosis.
5. Ventral diastasis with a superimposed fat containing umbilical
hernia.
6.  Aortic Atherosclerosis ([id number]-DU5.5).

These results were called by telephone at the time of interpretation
on 07/23/2019 at [DATE] to provider PRZE KONWA , who verbally
acknowledged these results.

## 2021-04-29 NOTE — ED Provider Notes (Signed)
[facility] UNIT Provider Note   CSN: [id number] Arrival date & time: 04/24/21  1733     History Chief Complaint  Patient presents with   Hypoglycemia    [name] is a 86 y.o. female.  HPI  86 year old female past medical history of CKD, HTN, paroxysmal A. fib, DM, recent UTI and currently on antibiotics presents the emergency department with concern for altered mental status.  Patient was found lethargic at home, hypoglycemia with blood sugars in the 50s.  Given oral glucose.  On arrival CBG is appropriate but can patient continues to be confused.  Level 5 caveat due to change in mental status/poor historian.  Currently no family at bedside.  Patient at this time has no acute pain complaints.  It sounds like she was on her last day of antibiotic for UTI.  Admits to decreased appetite but denies any noncompliance with medications.  Past Medical History:  Diagnosis Date   Anemia    Arthritis    Chronic diastolic heart failure (HCC)    CKD (chronic kidney disease) stage 4, GFR 15-29 ml/min (HCC)    Essential hypertension    GERD (gastroesophageal reflux disease)    Hemorrhoids    History of stroke    Intraparenchymal hemorrhage of brain (HCC)    PAF (paroxysmal atrial fibrillation) ([place])    Type 2 diabetes mellitus ([place])     Patient Active Problem List   Diagnosis Date Noted   Hypothermia 04/25/2021   Mild protein-calorie malnutrition ([place]) 58/52/7782   Acute metabolic encephalopathy 42/35/3614   Acute CHF ([place]) 02/24/2021   Acute respiratory distress 10/11/2020   Sinus tachycardia    Respiratory distress    Uncontrolled type 2 diabetes mellitus with hyperglycemia ([place])    Controlled type 2 diabetes mellitus with hyperglycemia, with long-term current use of insulin (HCC)    Intraparenchymal hemorrhage of brain ([place]) 09/30/2020   ICH (intracerebral hemorrhage) ([place]) 09/24/2020   Pneumonia due to COVID-19 virus 02/12/2020   Acute hypoxemic respiratory  failure due to COVID-19 [facility]) 02/08/2020   Educated about COVID-19 virus infection 01/11/2020   Acute urinary retention    Acute pulmonary edema ([place])    AKI (acute kidney injury) ([place]) 07/25/2019   Acute respiratory failure with hypercapnia ([place]) 07/24/2019   Type 2 diabetes mellitus ([place]) 07/23/2019   Atrial fibrillation, chronic (HCC) 07/23/2019   Chronic diastolic CHF (congestive heart failure) (HCC)/EF > 60 % 07/23/2019   Shortness of breath 06/19/2019   Acute on chronic diastolic CHF (congestive heart failure) ([place]) 07/08/2017   Acute respiratory failure with hypoxemia ([place]) 07/08/2017   Uncontrolled type 2 diabetes mellitus with diabetic nephropathy, with long-term current use of insulin 07/08/2017   CKD (chronic kidney disease), stage IIIb 07/08/2017   Essential hypertension 07/08/2017   Hypertension 06/23/2017   Prolapsed internal hemorrhoids, grade 3 06/25/2016   Dysphagia, pharyngoesophageal phase    Other hemorrhoids    Diverticulosis of colon without hemorrhage    GERD (gastroesophageal reflux disease) 02/28/2014   Esophageal dysphagia 02/28/2014   Rectal bleeding 02/28/2014   OSTEOARTHRITIS, KNEE, RIGHT, SEVERE 12/13/2008   DEGENERATIVE [place] DISEASE, LUMBOSACRAL SPINE W/RADICULOPATHY 12/13/2008   BACK PAIN 12/13/2008   DM II (diabetes mellitus, type II), controlled ([place]) 04/13/2007   FRACTURE, FEMUR, INTERTROCHANTERIC REGION 04/13/2007    Past Surgical History:  Procedure Laterality Date   BACK SURGERY     CATARACT EXTRACTION W/PHACO Left 02/28/2013   Procedure: CATARACT EXTRACTION PHACO AND INTRAOCULAR LENS PLACEMENT (IOL) CDE=15.60;  Surgeon: Elta Guadeloupe T. Gershon Crane, MD;  Location: [facility] ORS;  Service: Ophthalmology;  Laterality: Left;   CHOLECYSTECTOMY     COLONOSCOPY  12/12/2003   RMR: Normal rectum.  Long capacious tortuous colon, colonic mucosa appeared normal   COLONOSCOPY N/A 03/21/2014   Dr. [name]: internal and external hemorrhoids, torturous colon, colonic  diverticulosis    ESOPHAGOGASTRODUODENOSCOPY  12/28/2001   CHY:IFOYD sliding hiatal hernia/. Three small bulbar ulcers, two with stigmata of bleeding and these were coagulated using heater probe unit    ESOPHAGOGASTRODUODENOSCOPY N/A 03/21/2014   Dr. [name]: cervical esophageal web s/p dilation, negative H.pylori    EYE SURGERY     HIP FRACTURE SURGERY  2008   JOINT REPLACEMENT     Rt hip, ????? sounds like just for fracture   MALONEY DILATION N/A 03/21/2014   Procedure: [name] DILATION;  Surgeon: [name], MD;  Location: [facility] ENDO SUITE;  Service: Endoscopy;  Laterality: N/A;   SAVORY DILATION N/A 03/21/2014   Procedure: SAVORY DILATION;  Surgeon: [name], MD;  Location: [facility] END[facility] SU[facility];  Service: Endoscopy;  Laterality: N/A;     OB History   No obstetric history on file.     Family History  Problem Relation Age of Onset   Crohn's disease Daughter    Hypertension Mother    Hypertension Sister    Diabetes Brother    Diabetes Sister    Diabetes Sister    Heart attack Sister    Colon cancer Neg Hx     Social History   Tobacco Use   Smoking status: Never   Smokeless tobacco: Never  Vaping Use   Vaping Use: Never used  Substance Use Topics   Alcohol use: No   Drug use: No    Home Medications Prior to Admission medications   Medication Sig Start Date End Date Taking? Authorizing Provider  acetaminophen (TYLENOL) 325 MG tablet Take 2 tablets (650 mg total) by mouth every 6 (six) hours as needed for mild pain, fever or headache (or Fever >/= 101). 07/31/19  Yes Emokpae, Courage, MD  albuterol (VENTOLIN HFA) 108 (90 Base) MCG/ACT inhaler Inhale 2 puffs into the lungs every 6 (six) hours as needed for wheezing or shortness of breath.   Yes [provider]  amLODipine (NORVASC) 10 MG tablet Take 1 tablet (10 mg total) by mouth daily. 09/29/20  Yes Patrecia Pour, MD  carvedilol (COREG) 12.5 MG tablet Take 1 tablet (12.5 mg total) by mouth 2 (two) times daily with a  meal. 02/28/21 04/24/21 Yes Shahmehdi, Seyed A, MD  DULoxetine (CYMBALTA) 30 MG capsule Take 1 capsule (30 mg total) by mouth daily. 10/11/20  Yes Angiulli, Lavon Paganini, PA-C  famotidine (PEPCID) 20 MG tablet Take 20 mg by mouth daily. 01/29/21  Yes [provider]  hydrALAZINE (APRESOLINE) 100 MG tablet Take 1 tablet (100 mg total) by mouth 3 (three) times daily. 09/30/20  Yes Patrecia Pour, MD  hydrocortisone (ANUSOL-HC) 2.5 % rectal cream Place 1 application rectally 2 (two) times daily as needed for hemorrhoids or anal itching. 01/03/20  Yes Barton Dubois, MD  insulin aspart (NOVOLOG) 100 UNIT/ML FlexPen Inject 0-10 Units into the skin 3 (three) times daily with meals. insulin aspart (novoLOG) injection 0-10 Units 0-10 Units Subcutaneous, 3 times daily with meals CBG < 70: Implement Hypoglycemia Standing Orders and refer to Hypoglycemia Standing Orders sidebar report  CBG 70 - 120: 0 unit CBG 121 - 150: 0 unit  CBG 151 - 200: 1  unit CBG 201 - 250: 2 units CBG 251 - 300: 4 units CBG 301 - 350: 6 units  CBG 351 - 400: 8 units  CBG > 400: 10 units 07/31/19  Yes Emokpae, Courage, MD  insulin detemir (LEVEMIR) 100 UNIT/ML injection Inject 0.22 mLs (22 Units total) into the skin 2 (two) times daily. 10/16/20  Yes Dessa Phi, DO  pantoprazole (PROTONIX) 40 MG tablet Take 1 tablet by mouth daily. 09/06/20  Yes [provider]  potassium chloride (KLOR-CON) 10 MEQ tablet Take 10 mEq by mouth 2 (two) times daily. 03/29/21  Yes [provider]  simvastatin (ZOCOR) 20 MG tablet Take 20 mg by mouth at bedtime. 01/29/21  Yes [provider]  torsemide (DEMADEX) 20 MG tablet Take 1 tablet (20 mg total) by mouth 2 (two) times daily. 02/28/21 04/24/21 Yes Shahmehdi, Seyed A, MD  senna-docusate (SENOKOT-S) 8.6-50 MG tablet Take 2 tablets by mouth 2 (two) times daily. 04/28/21 05/28/21  Deatra James, MD    Allergies    Patient has no known allergies.  Review of Systems   Review of  Systems  Constitutional:  Positive for appetite change and fatigue. Negative for chills and fever.  HENT:  Negative for congestion.   Eyes:  Negative for visual disturbance.  Respiratory:  Negative for shortness of breath.   Cardiovascular:  Negative for chest pain.  Gastrointestinal:  Positive for nausea. Negative for abdominal pain, diarrhea and vomiting.  Genitourinary:  Positive for dysuria.  Skin:  Negative for rash.  Neurological:  Negative for headaches.  Psychiatric/Behavioral:  Positive for confusion and decreased concentration.    Physical Exam Updated Vital Signs BP (!) 157/60 (BP Location: Right Arm)   Pulse 84   Temp 98.3 F (36.8 C) (Oral)   Resp 18   Ht 5\' 4"  (1.626 m)   Wt 94.5 kg   SpO2 94%   BMI 35.76 kg/m   Physical Exam Vitals and nursing note reviewed.  Constitutional:      General: She is not in acute distress.    Appearance: Normal appearance.  HENT:     Head: Normocephalic.     Mouth/Throat:     Mouth: Mucous membranes are moist.  Eyes:     Pupils: Pupils are equal, round, and reactive to light.  Cardiovascular:     Rate and Rhythm: Normal rate.  Pulmonary:     Effort: Pulmonary effort is normal. No respiratory distress.     Breath sounds: Rales present.  Abdominal:     Palpations: Abdomen is soft.     Tenderness: There is no abdominal tenderness. There is no guarding or rebound.  Musculoskeletal:        General: No swelling or deformity.     Cervical back: No tenderness.  Skin:    General: Skin is warm.  Neurological:     General: No focal deficit present.     Mental Status: She is alert and oriented to person, place, and time. Mental status is at baseline.  Psychiatric:        Mood and Affect: Mood normal.    ED Results / Procedures / Treatments   Labs (all labs ordered are listed, but only abnormal results are displayed) Labs Reviewed  CBC WITH DIFFERENTIAL/PLATELET - Abnormal; Notable for the following components:      Result  Value   RBC 2.98 (*)    Hemoglobin 8.5 (*)    HCT 27.5 (*)    All other components within normal limits  COMPREHENSIVE METABOLIC PANEL - Abnormal; Notable for the following components:   Potassium 5.2 (*)    CO2 21 (*)    Glucose, Bld 194 (*)    BUN 82 (*)    Creatinine, Ser 4.12 (*)    Calcium 8.5 (*)    Albumin 3.3 (*)    GFR, Estimated 10 (*)    All other components within normal limits  URINALYSIS, ROUTINE W REFLEX MICROSCOPIC - Abnormal; Notable for the following components:   Leukocytes,Ua SMALL (*)    All other components within normal limits  MAGNESIUM - Abnormal; Notable for the following components:   Magnesium 2.7 (*)    All other components within normal limits  URINALYSIS, MICROSCOPIC (REFLEX) - Abnormal; Notable for the following components:   Bacteria, UA RARE (*)    All other components within normal limits  GLUCOSE, CAPILLARY - Abnormal; Notable for the following components:   Glucose-Capillary 388 (*)    All other components within normal limits  COMPREHENSIVE METABOLIC PANEL - Abnormal; Notable for the following components:   Sodium 134 (*)    Potassium 6.2 (*)    CO2 20 (*)    Glucose, Bld 430 (*)    BUN 82 (*)    Creatinine, Ser 4.31 (*)    Calcium 8.2 (*)    Albumin 3.2 (*)    Total Bilirubin 0.2 (*)    GFR, Estimated 10 (*)    All other components within normal limits  MAGNESIUM - Abnormal; Notable for the following components:   Magnesium 2.7 (*)    All other components within normal limits  BLOOD GAS, VENOUS - Abnormal; Notable for the following components:   pO2, Ven 80.9 (*)    Acid-base deficit 3.9 (*)    All other components within normal limits  GLUCOSE, CAPILLARY - Abnormal; Notable for the following components:   Glucose-Capillary 281 (*)    All other components within normal limits  COMPREHENSIVE METABOLIC PANEL - Abnormal; Notable for the following components:   Sodium 134 (*)    Potassium 5.8 (*)    Glucose, Bld 339 (*)    BUN 80  (*)    Creatinine, Ser 4.21 (*)    Calcium 8.0 (*)    Albumin 3.0 (*)    Total Bilirubin 0.1 (*)    GFR, Estimated 10 (*)    All other components within normal limits  BRAIN NATRIURETIC PEPTIDE - Abnormal; Notable for the following components:   B Natriuretic Peptide 290.0 (*)    All other components within normal limits  GLUCOSE, CAPILLARY - Abnormal; Notable for the following components:   Glucose-Capillary 215 (*)    All other components within normal limits  GLUCOSE, CAPILLARY - Abnormal; Notable for the following components:   Glucose-Capillary 192 (*)    All other components within normal limits  GLUCOSE, CAPILLARY - Abnormal; Notable for the following components:   Glucose-Capillary 221 (*)    All other components within normal limits  RENAL FUNCTION PANEL - Abnormal; Notable for the following components:   Glucose, Bld 159 (*)    BUN 69 (*)    Creatinine, Ser 3.50 (*)    Calcium 8.2 (*)    Albumin 3.1 (*)    GFR, Estimated 12 (*)    All other components within normal limits  GLUCOSE, CAPILLARY - Abnormal; Notable for the following components:   Glucose-Capillary 195 (*)    All other components within normal limits  GLUCOSE, CAPILLARY - Abnormal; Notable for the following  components:   Glucose-Capillary 160 (*)    All other components within normal limits  GLUCOSE, CAPILLARY - Abnormal; Notable for the following components:   Glucose-Capillary 181 (*)    All other components within normal limits  GLUCOSE, CAPILLARY - Abnormal; Notable for the following components:   Glucose-Capillary 191 (*)    All other components within normal limits  RENAL FUNCTION PANEL - Abnormal; Notable for the following components:   Glucose, Bld 207 (*)    BUN 49 (*)    Creatinine, Ser 2.37 (*)    Calcium 8.5 (*)    Albumin 3.0 (*)    GFR, Estimated 20 (*)    Anion gap 3 (*)    All other components within normal limits  GLUCOSE, CAPILLARY - Abnormal; Notable for the following  components:   Glucose-Capillary 196 (*)    All other components within normal limits  GLUCOSE, CAPILLARY - Abnormal; Notable for the following components:   Glucose-Capillary 185 (*)    All other components within normal limits  COMPREHENSIVE METABOLIC PANEL - Abnormal; Notable for the following components:   Glucose, Bld 235 (*)    BUN 47 (*)    Creatinine, Ser 2.26 (*)    Calcium 8.8 (*)    AST 13 (*)    GFR, Estimated 21 (*)    All other components within normal limits  GLUCOSE, CAPILLARY - Abnormal; Notable for the following components:   Glucose-Capillary 218 (*)    All other components within normal limits  GLUCOSE, CAPILLARY - Abnormal; Notable for the following components:   Glucose-Capillary 238 (*)    All other components within normal limits  RENAL FUNCTION PANEL - Abnormal; Notable for the following components:   Glucose, Bld 186 (*)    BUN 38 (*)    Creatinine, Ser 2.00 (*)    Albumin 3.3 (*)    GFR, Estimated 24 (*)    All other components within normal limits  BASIC METABOLIC PANEL - Abnormal; Notable for the following components:   Glucose, Bld 183 (*)    BUN 39 (*)    Creatinine, Ser 2.04 (*)    GFR, Estimated 24 (*)    Anion gap 4 (*)    All other components within normal limits  CBC - Abnormal; Notable for the following components:   RBC 3.12 (*)    Hemoglobin 8.7 (*)    HCT 28.7 (*)    All other components within normal limits  GLUCOSE, CAPILLARY - Abnormal; Notable for the following components:   Glucose-Capillary 227 (*)    All other components within normal limits  GLUCOSE, CAPILLARY - Abnormal; Notable for the following components:   Glucose-Capillary 181 (*)    All other components within normal limits  GLUCOSE, CAPILLARY - Abnormal; Notable for the following components:   Glucose-Capillary 207 (*)    All other components within normal limits  GLUCOSE, CAPILLARY - Abnormal; Notable for the following components:   Glucose-Capillary 302 (*)     All other components within normal limits  CBG MONITORING, ED - Abnormal; Notable for the following components:   Glucose-Capillary 143 (*)    All other components within normal limits  CULTURE, BLOOD (ROUTINE X 2)  CULTURE, BLOOD (ROUTINE X 2)  RESP PANEL BY RT-PCR (FLU A&B, COVID) ARPGX2  URINE CULTURE  LACTIC ACID, PLASMA  AMMONIA  TSH  TSH  IRON AND TIBC  FERRITIN  TROPONIN I (HIGH SENSITIVITY)  TROPONIN I (HIGH SENSITIVITY)    EKG EKG  Interpretation  Date/Time:  Thursday April 24 2021 20:17:58 EST Ventricular Rate:  51 PR Interval:    QRS Duration: 147 QT Interval:  583 QTC Calculation: 537 R Axis:   -23 Text Interpretation: Junctional rhythm Left bundle branch block Confirmed by Lavenia Atlas [phone]) on 04/24/2021 8:23:23 PM  Radiology DG CHEST PORT 1 VIEW  Result Date: 04/27/2021 CLINICAL DATA:  Shortness of breath EXAM: PORTABLE CHEST 1 VIEW COMPARISON:  04/24/2021 FINDINGS: Stable cardiomegaly and central pulmonary vascular congestion. Mild interstitial edema. No significant pleural effusion. Left basilar atelectasis. IMPRESSION: Cardiomegaly and central pulmonary vascular congestion with mild interstitial edema. Electronically Signed   By: Macy Mis M.D.   On: 04/27/2021 09:13    Procedures Procedures   Medications Ordered in ED Medications  perflutren lipid microspheres (DEFINITY) IV suspension (3 mLs Intravenous Given 04/25/21 1347)  lactated ringers bolus 500 mL (0 mLs Intravenous Stopped 04/24/21 2210)  insulin aspart (novoLOG) injection 12 Units (12 Units Subcutaneous Given 04/25/21 0428)  furosemide (LASIX) injection 40 mg (40 mg Intravenous Given 04/25/21 0428)  sodium zirconium cyclosilicate (LOKELMA) packet 10 g (10 g Oral Given 04/25/21 0427)  furosemide (LASIX) injection 40 mg (40 mg Intravenous Given 04/28/21 0028)  sodium phosphate (FLEET) 7-19 GM/118ML enema 1 enema (1 enema Rectal Given 04/27/21 1802)    ED Course  I have reviewed the  triage vital signs and the nursing notes.  Pertinent labs & imaging results that were available during my care of the patient were reviewed by me and considered in my medical decision making (see chart for details).    MDM Rules/Calculators/A&P                           86 year old female with medical history of current UTI on antibiotics presents emergency department with change in mental status.  Noted to be hypoglycemic and given oral dextrose.  CBG appropriate on arrival however patient continues to be confused, poor historian.  Has no acute pain complaints.  Bradycardic on arrival which has been baseline without any other acute vital sign abnormalities.  She appears nonfocal on neuro exam.  Patient had Rales on exam with history of weight gain and recent increase in diuretic.  Blood work shows a worsening AKI with elevated BUN, possibly uremic encephalopathy.  Patient also has other mild metabolic derangements that will be treated.  Patient will require admission for encephalopathy and continues to be confused.  Patients evaluation and results requires admission for further treatment and care. Patient agrees with admission plan, offers no new complaints and is stable/unchanged at time of admit.  Final Clinical Impression(s) / ED Diagnoses Final diagnoses:  Acute renal failure superimposed on chronic kidney disease, unspecified CKD stage, unspecified acute renal failure type [facility])    Rx / DC Orders ED Discharge Orders          Ordered    Discharge instructions       Comments: Recommending follow-up with a PCP within 1 week, recommending referral to palliative care   04/28/21 0917    Activity as tolerated - No restrictions        04/28/21 0917    Increase activity slowly        04/28/21 0917    Diet - low sodium heart healthy        04/28/21 0917    Call MD for:  temperature >100.4        04/28/21 0917    Call MD  for:  redness, tenderness, or signs of infection (pain, swelling,  redness, odor or green/yellow discharge around incision site)        04/28/21 0917    Call MD for:  difficulty breathing, headache or visual disturbances        04/28/21 0917    Call MD for:  persistant dizziness or light-headedness        04/28/21 0917    Call MD for:  extreme fatigue        04/28/21 0917    senna-docusate (SENOKOT-S) 8.6-50 MG tablet  2 times daily        04/28/21 [place], [place], DO 04/29/21 [id number]

## 2021-04-30 DIAGNOSIS — I5033 Acute on chronic diastolic (congestive) heart failure: Secondary | ICD-10-CM | POA: Diagnosis not present

## 2021-04-30 DIAGNOSIS — E1122 Type 2 diabetes mellitus with diabetic chronic kidney disease: Secondary | ICD-10-CM | POA: Diagnosis not present

## 2021-04-30 DIAGNOSIS — E1140 Type 2 diabetes mellitus with diabetic neuropathy, unspecified: Secondary | ICD-10-CM | POA: Diagnosis not present

## 2021-04-30 DIAGNOSIS — I130 Hypertensive heart and chronic kidney disease with heart failure and stage 1 through stage 4 chronic kidney disease, or unspecified chronic kidney disease: Secondary | ICD-10-CM | POA: Diagnosis not present

## 2021-04-30 DIAGNOSIS — G8929 Other chronic pain: Secondary | ICD-10-CM | POA: Diagnosis not present

## 2021-04-30 DIAGNOSIS — D631 Anemia in chronic kidney disease: Secondary | ICD-10-CM | POA: Diagnosis not present

## 2021-04-30 DIAGNOSIS — N1831 Chronic kidney disease, stage 3a: Secondary | ICD-10-CM | POA: Diagnosis not present

## 2021-04-30 DIAGNOSIS — F32A Depression, unspecified: Secondary | ICD-10-CM | POA: Diagnosis not present

## 2021-04-30 DIAGNOSIS — I4891 Unspecified atrial fibrillation: Secondary | ICD-10-CM | POA: Diagnosis not present

## 2021-05-01 DIAGNOSIS — E1122 Type 2 diabetes mellitus with diabetic chronic kidney disease: Secondary | ICD-10-CM | POA: Diagnosis not present

## 2021-05-01 DIAGNOSIS — G8929 Other chronic pain: Secondary | ICD-10-CM | POA: Diagnosis not present

## 2021-05-01 DIAGNOSIS — I5033 Acute on chronic diastolic (congestive) heart failure: Secondary | ICD-10-CM | POA: Diagnosis not present

## 2021-05-01 DIAGNOSIS — N1831 Chronic kidney disease, stage 3a: Secondary | ICD-10-CM | POA: Diagnosis not present

## 2021-05-01 DIAGNOSIS — I130 Hypertensive heart and chronic kidney disease with heart failure and stage 1 through stage 4 chronic kidney disease, or unspecified chronic kidney disease: Secondary | ICD-10-CM | POA: Diagnosis not present

## 2021-05-01 DIAGNOSIS — E1140 Type 2 diabetes mellitus with diabetic neuropathy, unspecified: Secondary | ICD-10-CM | POA: Diagnosis not present

## 2021-05-01 DIAGNOSIS — F32A Depression, unspecified: Secondary | ICD-10-CM | POA: Diagnosis not present

## 2021-05-01 DIAGNOSIS — I4891 Unspecified atrial fibrillation: Secondary | ICD-10-CM | POA: Diagnosis not present

## 2021-05-01 DIAGNOSIS — D631 Anemia in chronic kidney disease: Secondary | ICD-10-CM | POA: Diagnosis not present

## 2021-05-05 NOTE — Progress Notes (Incomplete)
CARDIOLOGY CONSULT NOTE       Patient ID: DEALIE KOELZER MRN: [id number] DOB/AGE: 84/13/1936 86 y.o.  Admit date: (Not on file) Referring Physician: Skipper Cliche Primary Physician: [name], MD Primary Cardiologist: Percival Spanish Reason for Consultation: Post hospital  Active Problems:   * No active hospital problems. *   HPI:  86 y.o. seen post hospital at request of Dr Roger Shelter. She carries a diagnosis of diastolic CHF, CKD 4 , DM, HTN, obesity PAF previous stroke with intraparenchymal brain hemorrhage May 2022 and chronic LBBB.  She is essentially wheel chair bound Eliquis was d/c at that time in May She has chronic respiratory failure on oxygen echo 04/25/21 EF 65-70% mild LVH grade 2 diastolic dysfunction normal RV no significant valve dx  D/c from hospital 75/91/63 after metabolic encephalopathy ? Low BS and azotemia with BUN 80 Cr 4.21.  CR eventually improved to 2.0 US kidneys no hydronephrosis Swallowing study showed significant aspiration CT head negative for acute event D/c on demedex 20 mg bid  ***  ROS All other systems reviewed and negative except as noted above  Past Medical History:  Diagnosis Date   Anemia    Arthritis    Chronic diastolic heart failure (HCC)    CKD (chronic kidney disease) stage 4, GFR 15-29 ml/min (HCC)    Essential hypertension    GERD (gastroesophageal reflux disease)    Hemorrhoids    History of stroke    Intraparenchymal hemorrhage of brain (HCC)    PAF (paroxysmal atrial fibrillation) (HCC)    Type 2 diabetes mellitus ([place])     Family History  Problem Relation Age of Onset   Crohn's disease Daughter    Hypertension Mother    Hypertension Sister    Diabetes Brother    Diabetes Sister    Diabetes Sister    Heart attack Sister    Colon cancer Neg Hx     Social History   Socioeconomic History   Marital status: Widowed    Spouse name: Not on file   Number of children: 8   Years of education: Not on  file   Highest education level: Not on file  Occupational History   Not on file  Tobacco Use   Smoking status: Never   Smokeless tobacco: Never  Vaping Use   Vaping Use: Never used  Substance and Sexual Activity   Alcohol use: No   Drug use: No   Sexual activity: Not on file  Other Topics Concern   Not on file  Social History Narrative   Not on file   Social Determinants of Health   Financial Resource Strain: Not on file  Food Insecurity: Not on file  Transportation Needs: Not on file  Physical Activity: Not on file  Stress: Not on file  Social Connections: Not on file  Intimate Partner Violence: Not on file    Past Surgical History:  Procedure Laterality Date   BACK SURGERY     CATARACT EXTRACTION W/PHACO Left 02/28/2013   Procedure: CATARACT EXTRACTION PHACO AND INTRAOCULAR LENS PLACEMENT (IOL) CDE=15.60;  Surgeon: Elta Guadeloupe T. Gershon Crane, MD;  Location: [facility];  Service: Ophthalmology;  Laterality: Left;   CHOLECYSTECTOMY     COLONOSCOPY  12/12/2003   RMR: Normal rectum.  Long capacious tortuous colon, colonic mucosa appeared normal   COLONOSCOPY N/A 03/21/2014   Dr. [name]: internal and external hemorrhoids, torturous colon, colonic diverticulosis    ESOPHAGOGASTRODUODENOSCOPY  12/28/2001   WGY:[id number] sliding hiatal hernia/. Three small bulbar ulcers,  two with stigmata of bleeding and these were coagulated using heater probe unit    ESOPHAGOGASTRODUODENOSCOPY N/A 03/21/2014   Dr. [name]: cervical esophageal web s/p dilation, negative H.pylori    EYE SURGERY     HIP FRACTURE SURGERY  2008   JOINT REPLACEMENT     Rt hip, ????? sounds like just for fracture   MALONEY DILATION N/A 03/21/2014   Procedure: [name] DILATION;  Surgeon: [name], MD;  Location: [facility];  Service: Endoscopy;  Laterality: N/A;   SAVORY DILATION N/A 03/21/2014   Procedure: SAVORY DILATION;  Surgeon: [name], MD;  Location: [facility];  Service: Endoscopy;   Laterality: N/A;      Current Outpatient Medications:    acetaminophen (TYLENOL) 325 MG tablet, Take 2 tablets (650 mg total) by mouth every 6 (six) hours as needed for mild pain, fever or headache (or Fever >/= 101)., Disp: 12 tablet, Rfl: 0   albuterol (VENTOLIN HFA) 108 (90 Base) MCG/ACT inhaler, Inhale 2 puffs into the lungs every 6 (six) hours as needed for wheezing or shortness of breath., Disp: , Rfl:    amLODipine (NORVASC) 10 MG tablet, Take 1 tablet (10 mg total) by mouth daily., Disp: , Rfl:    carvedilol (COREG) 12.5 MG tablet, Take 1 tablet (12.5 mg total) by mouth 2 (two) times daily with a meal., Disp: 60 tablet, Rfl: 2   DULoxetine (CYMBALTA) 30 MG capsule, Take 1 capsule (30 mg total) by mouth daily., Disp: , Rfl: 3   famotidine (PEPCID) 20 MG tablet, Take 20 mg by mouth daily., Disp: , Rfl:    hydrALAZINE (APRESOLINE) 100 MG tablet, Take 1 tablet (100 mg total) by mouth 3 (three) times daily., Disp: , Rfl:    hydrocortisone (ANUSOL-HC) 2.5 % rectal cream, Place 1 application rectally 2 (two) times daily as needed for hemorrhoids or anal itching., Disp: , Rfl:    insulin aspart (NOVOLOG) 100 UNIT/ML FlexPen, Inject 0-10 Units into the skin 3 (three) times daily with meals. insulin aspart (novoLOG) injection 0-10 Units 0-10 Units Subcutaneous, 3 times daily with meals CBG < 70: Implement Hypoglycemia Standing Orders and refer to Hypoglycemia Standing Orders sidebar report  CBG 70 - 120: 0 unit CBG 121 - 150: 0 unit  CBG 151 - 200: 1 unit CBG 201 - 250: 2 units CBG 251 - 300: 4 units CBG 301 - 350: 6 units  CBG 351 - 400: 8 units  CBG > 400: 10 units, Disp: 15 mL, Rfl: 11   insulin detemir (LEVEMIR) 100 UNIT/ML injection, Inject 0.22 mLs (22 Units total) into the skin 2 (two) times daily., Disp: 10 mL, Rfl: 0   pantoprazole (PROTONIX) 40 MG tablet, Take 1 tablet by mouth daily., Disp: , Rfl:    potassium chloride (KLOR-CON) 10 MEQ tablet, Take 10 mEq by mouth 2 (two) times  daily., Disp: , Rfl:    senna-docusate (SENOKOT-S) 8.6-50 MG tablet, Take 2 tablets by mouth 2 (two) times daily., Disp: 120 tablet, Rfl: 0   simvastatin (ZOCOR) 20 MG tablet, Take 20 mg by mouth at bedtime., Disp: , Rfl:    torsemide (DEMADEX) 20 MG tablet, Take 1 tablet (20 mg total) by mouth 2 (two) times daily., Disp: 60 tablet, Rfl: 3 No current facility-administered medications for this visit.  Facility-Administered Medications Ordered in Other Visits:    regadenoson (LEXISCAN) injection SOLN 0.4 mg, 0.4 mg, Intravenous, Once, Croitoru, Mihai, MD    Physical Exam: There were no vitals  taken for this visit.    Chronically ill obese female Previous stroke with right hemiparesis No murmur  Lungs clear Plus one edema Palpable DP bialterally   Labs:   Lab Results  Component Value Date   WBC 8.6 04/28/2021   HGB 8.7 (L) 04/28/2021   HCT 28.7 (L) 04/28/2021   MCV 92.0 04/28/2021   PLT 290 04/28/2021   No results for input(s): NA, K, CL, CO2, BUN, CREATININE, CALCIUM, PROT, BILITOT, ALKPHOS, ALT, AST, GLUCOSE in the last 168 hours.  Invalid input(s): LABALBU Lab Results  Component Value Date   TROPONINI <0.03 05/06/2018    Lab Results  Component Value Date   CHOL 129 09/26/2020   CHOL 118 12/27/2019   CHOL 118 07/24/2019   Lab Results  Component Value Date   HDL 29 (L) 09/26/2020   HDL 33 (L) 12/27/2019   HDL 38 (L) 07/24/2019   Lab Results  Component Value Date   LDLCALC 73 09/26/2020   LDLCALC 75 12/27/2019   LDLCALC 73 07/24/2019   Lab Results  Component Value Date   TRIG 133 09/26/2020   TRIG 127 02/08/2020   TRIG 52 12/27/2019   Lab Results  Component Value Date   CHOLHDL 4.4 09/26/2020   CHOLHDL 3.6 12/27/2019   CHOLHDL 3.1 07/24/2019   No results found for: LDLDIRECT    Radiology: CT HEAD WO CONTRAST (5MM)  Result Date: 04/25/2021 CLINICAL DATA:  Mental status change confusion. EXAM: CT HEAD WITHOUT CONTRAST TECHNIQUE: Contiguous axial  images were obtained from the base of the skull through the vertex without intravenous contrast. COMPARISON:  09/24/2020 FINDINGS: Brain: Ventricle size and cerebral volume normal. Moderate white matter hypodensity bilaterally, chronic and unchanged from prior MRI. Chronic infarct right cerebellum. Negative for acute infarct, hemorrhage, mass Vascular: Negative for hyperdense vessel. Atherosclerotic calcification. Skull: Negative Sinuses/Orbits: Paranasal sinuses clear. Bilateral cataract extraction Other: None IMPRESSION: No acute abnormality Chronic ischemic changes are stable from prior studies. Electronically Signed   By: Franchot Gallo M.D.   On: 04/25/2021 11:39   US RENAL  Result Date: 04/25/2021 CLINICAL DATA:  In a 87 year old female presents for evaluation of acute kidney injury. EXAM: RENAL / URINARY TRACT ULTRASOUND COMPLETE COMPARISON:  Comparison with CT imaging from December 26, 2019. FINDINGS: Right Kidney: Renal measurements: 10.6 x 4.3 x 4.8 cm = volume: 116.1 mL. Increased cortical echogenicity without hydronephrosis or gross lesion or nephrolithiasis. Signs of cortical scarring. Left Kidney: Renal measurements: 8.7 x 4.9 x 5.4 cm = volume: 119 mL. Increased cortical echogenicity without hydronephrosis, visible calculus or gross lesion. Signs of cortical scarring. Bladder: Appears normal for degree of bladder distention. Other: Study limited by patient body habitus IMPRESSION: Mild increased cortical echogenicity with signs of cortical scarring. No hydronephrosis. Exam limited by body habitus. Electronically Signed   By: Zetta Bills M.D.   On: 04/25/2021 11:44   DG CHEST PORT 1 VIEW  Result Date: 04/27/2021 CLINICAL DATA:  Shortness of breath EXAM: PORTABLE CHEST 1 VIEW COMPARISON:  04/24/2021 FINDINGS: Stable cardiomegaly and central pulmonary vascular congestion. Mild interstitial edema. No significant pleural effusion. Left basilar atelectasis. IMPRESSION: Cardiomegaly and central  pulmonary vascular congestion with mild interstitial edema. Electronically Signed   By: Macy Mis M.D.   On: 04/27/2021 09:13   DG Chest Port 1 View  Result Date: 04/24/2021 CLINICAL DATA:  Shortness of breath. EXAM: PORTABLE CHEST 1 VIEW COMPARISON:  Chest x-ray 02/24/2021.  CT chest 07/23/2019. FINDINGS: The heart is enlarged. There central pulmonary  vascular congestion. There is minimal left base atelectasis. Costophrenic angles are clear. There is no pneumothorax or acute fracture. IMPRESSION: 1. Cardiomegaly with central pulmonary vascular congestion. 2. Left basilar atelectasis. Electronically Signed   By: Ronney Asters M.D.   On: 04/24/2021 21:20   ECHOCARDIOGRAM COMPLETE  Result Date: 04/25/2021    ECHOCARDIOGRAM REPORT   Patient Name:   HENRY DEMERITT Date of Exam: 04/25/2021 Medical Rec #:  [id number]    Height:       64.0 in Accession #:    [id number]   Weight:       214.1 lb Date of Birth:  01-24-1935     BSA:          2.014 m Patient Age:    20 years     BP:           134/59 mmHg Patient Gender: F            HR:           69 bpm. Exam Location:  [facility] Procedure: 2D Echo, Cardiac Doppler and Color Doppler Indications:    Dyspnea R06.00  History:        Patient has prior history of Echocardiogram examinations, most                 recent 02/24/2021. CHF, Stroke, Arrythmias:Atrial Fibrillation;                 Risk Factors:Hypertension and Dyslipidemia. Hx of COVID-19                 infection.  Sonographer:    Alvino Chapel RCS Referring Phys: [id number] ASIA B [place]  1. Left ventricular ejection fraction, by estimation, is 65 to 70%. The left ventricle has normal function. The left ventricle has no regional wall motion abnormalities. There is mild left ventricular hypertrophy. Left ventricular diastolic parameters are consistent with Grade II diastolic dysfunction (pseudonormalization). Elevated left atrial pressure.  2. Right ventricular systolic function is normal. The  right ventricular size is normal. Tricuspid regurgitation signal is inadequate for assessing PA pressure.  3. Left atrial size was mildly dilated.  4. The mitral valve is normal in structure. No evidence of mitral valve regurgitation. No evidence of mitral stenosis.  5. The aortic valve is tricuspid. Aortic valve regurgitation is not visualized. No aortic stenosis is present.  6. The inferior vena cava is dilated in size with <50% respiratory variability, suggesting right atrial pressure of 15 mmHg. FINDINGS  Left Ventricle: Left ventricular ejection fraction, by estimation, is 65 to 70%. The left ventricle has normal function. The left ventricle has no regional wall motion abnormalities. Definity contrast agent was given IV to delineate the left ventricular  endocardial borders. The left ventricular internal cavity size was normal in size. There is mild left ventricular hypertrophy. Left ventricular diastolic parameters are consistent with Grade II diastolic dysfunction (pseudonormalization). Elevated left atrial pressure. Right Ventricle: The right ventricular size is normal. No increase in right ventricular wall thickness. Right ventricular systolic function is normal. Tricuspid regurgitation signal is inadequate for assessing PA pressure. Left Atrium: Left atrial size was mildly dilated. Right Atrium: Right atrial size was normal in size. Pericardium: There is no evidence of pericardial effusion. Mitral Valve: The mitral valve is normal in structure. No evidence of mitral valve regurgitation. No evidence of mitral valve stenosis. MV peak gradient, 10.9 mmHg. The mean mitral valve gradient is 3.0 mmHg. Tricuspid Valve: The tricuspid valve is  normal in structure. Tricuspid valve regurgitation is not demonstrated. No evidence of tricuspid stenosis. Aortic Valve: The aortic valve is tricuspid. Aortic valve regurgitation is not visualized. No aortic stenosis is present. Aortic valve mean gradient measures 8.0 mmHg.  Aortic valve peak gradient measures 13.8 mmHg. Aortic valve area, by VTI measures 1.76  cm. Pulmonic Valve: The pulmonic valve was not well visualized. Pulmonic valve regurgitation is not visualized. No evidence of pulmonic stenosis. Aorta: The aortic root is normal in size and structure. Venous: The inferior vena cava is dilated in size with less than 50% respiratory variability, suggesting right atrial pressure of 15 mmHg. IAS/Shunts: No atrial level shunt detected by color flow Doppler.  LEFT VENTRICLE PLAX 2D LVIDd:         4.80 cm   Diastology LVIDs:         3.10 cm   LV e' medial:    5.77 cm/s LV PW:         1.20 cm   LV E/e' medial:  24.4 LV IVS:        1.20 cm   LV e' lateral:   6.31 cm/s LVOT diam:     1.80 cm   LV E/e' lateral: 22.3 LV SV:         70 LV SV Index:   35 LVOT Area:     2.54 cm  RIGHT VENTRICLE RV S prime:     15.40 cm/s TAPSE (M-mode): 2.0 cm LEFT ATRIUM             Index        RIGHT ATRIUM           Index LA diam:        3.60 cm 1.79 cm/m   RA Area:     24.30 cm LA Vol (A2C):   96.0 ml 47.67 ml/m  RA Volume:   78.30 ml  38.88 ml/m LA Vol (A4C):   69.3 ml 34.41 ml/m LA Biplane Vol: 84.4 ml 41.91 ml/m  AORTIC VALVE AV Area (Vmax):    1.79 cm AV Area (Vmean):   1.85 cm AV Area (VTI):     1.76 cm AV Vmax:           186.00 cm/s AV Vmean:          126.000 cm/s AV VTI:            0.399 m AV Peak Grad:      13.8 mmHg AV Mean Grad:      8.0 mmHg LVOT Vmax:         131.00 cm/s LVOT Vmean:        91.700 cm/s LVOT VTI:          0.276 m LVOT/AV VTI ratio: 0.69  AORTA Ao Root diam: 3.40 cm MITRAL VALVE MV Area (PHT): 2.16 cm     SHUNTS MV Area VTI:   1.20 cm     Systemic VTI:  0.28 m MV Peak grad:  10.9 mmHg    Systemic Diam: 1.80 cm MV Mean grad:  3.0 mmHg MV Vmax:       1.65 m/s MV Vmean:      72.1 cm/s MV Decel Time: 352 msec MV E velocity: 141.00 cm/s MV A velocity: 83.20 cm/s MV E/A ratio:  1.69 Carlyle Dolly MD Electronically signed by Carlyle Dolly MD Signature Date/Time:  04/25/2021/2:50:21 PM    Final     EKG: Junctional rate 58 ICLBBB   ASSESSMENT AND PLAN:  Diastolic CHF:  not clear what part aspiration and respiratory failure play a role continue demedex. History of azotemia check BMET/BNP PAF:  May 2022 brain hemorrhage eliquis stopped 04/2021 d/c with renally dosed eliquis 2.5 mg bid *** Stroke:  right hemiparesis non ambulatory aspiration risk *** DM:  recent hypoglycemia with encephalopathy insulin dose adjusted  CRF:  AS high as 4.12 two weeks ago d/c 2.0 f/u [place] check BMET Anemia:  related to above Hct 27.5  f/u primary HTN:  Well controlled.  Continue current medications and low sodium Dash type diet.   HLD:  continue statin labs with primary    BMET  F/U *** Signed: Jenkins Rouge 05/05/2021, 1:53 PM

## 2021-05-15 ENCOUNTER — Ambulatory Visit: Payer: Medicare HMO | Admitting: Cardiovascular Disease

## 2021-07-21 DIAGNOSIS — Z743 Need for continuous supervision: Secondary | ICD-10-CM | POA: Diagnosis not present

## 2021-07-21 DIAGNOSIS — R279 Unspecified lack of coordination: Secondary | ICD-10-CM | POA: Diagnosis not present

## 2021-07-21 DIAGNOSIS — I10 Essential (primary) hypertension: Secondary | ICD-10-CM | POA: Diagnosis not present

## 2022-03-10 ENCOUNTER — Telehealth: Payer: Self-pay | Admitting: *Deleted

## 2022-03-10 NOTE — Progress Notes (Unsigned)
No segment

## 2022-03-10 NOTE — Chronic Care Management (AMB) (Unsigned)
  Care Coordination  Outreach Note  03/10/2022 Name: REIKO VINJE MRN: [id number] DOB: Jun 14, 1934   Care Coordination Outreach Attempts: An unsuccessful telephone outreach was attempted today to offer the patient information about available care coordination services as a benefit of their health plan.   Follow Up Plan:  No further outreach attempts will be made at this time. We have been unable to contact the patient to offer or enroll patient in care coordination services  Encounter Outcome:  No Answer  [place]: [phone]

## 2022-07-16 ENCOUNTER — Encounter: Payer: Self-pay | Admitting: Radiology

## 2022-07-21 ENCOUNTER — Encounter (HOSPITAL_COMMUNITY): Payer: Self-pay

## 2022-07-21 ENCOUNTER — Other Ambulatory Visit: Payer: Self-pay

## 2022-07-21 ENCOUNTER — Emergency Department (HOSPITAL_COMMUNITY)

## 2022-07-21 ENCOUNTER — Emergency Department (HOSPITAL_COMMUNITY)
Admission: EM | Admit: 2022-07-21 | Discharge: 2022-07-21 | Disposition: A | Discharge status: Home / Self Care | Attending: Emergency Medicine | Admitting: Emergency Medicine

## 2022-07-21 DIAGNOSIS — I509 Heart failure, unspecified: Secondary | ICD-10-CM | POA: Insufficient documentation

## 2022-07-21 DIAGNOSIS — R609 Edema, unspecified: Secondary | ICD-10-CM | POA: Diagnosis not present

## 2022-07-21 DIAGNOSIS — I82B12 Acute embolism and thrombosis of left subclavian vein: Secondary | ICD-10-CM | POA: Insufficient documentation

## 2022-07-21 DIAGNOSIS — Z7401 Bed confinement status: Secondary | ICD-10-CM | POA: Diagnosis not present

## 2022-07-21 DIAGNOSIS — N390 Urinary tract infection, site not specified: Secondary | ICD-10-CM | POA: Insufficient documentation

## 2022-07-21 DIAGNOSIS — F0390 Unspecified dementia without behavioral disturbance: Secondary | ICD-10-CM | POA: Diagnosis not present

## 2022-07-21 DIAGNOSIS — I110 Hypertensive heart disease with heart failure: Secondary | ICD-10-CM | POA: Insufficient documentation

## 2022-07-21 DIAGNOSIS — I82612 Acute embolism and thrombosis of superficial veins of left upper extremity: Secondary | ICD-10-CM | POA: Diagnosis not present

## 2022-07-21 DIAGNOSIS — I10 Essential (primary) hypertension: Secondary | ICD-10-CM | POA: Diagnosis not present

## 2022-07-21 DIAGNOSIS — Z79899 Other long term (current) drug therapy: Secondary | ICD-10-CM | POA: Insufficient documentation

## 2022-07-21 DIAGNOSIS — R5383 Other fatigue: Secondary | ICD-10-CM | POA: Diagnosis present

## 2022-07-21 DIAGNOSIS — R5381 Other malaise: Secondary | ICD-10-CM | POA: Diagnosis not present

## 2022-07-21 LAB — URINALYSIS, ROUTINE W REFLEX MICROSCOPIC
Bilirubin Urine: NEGATIVE
Glucose, UA: 50 mg/dL — AB
Ketones, ur: NEGATIVE mg/dL
Nitrite: POSITIVE — AB
Protein, ur: 100 mg/dL — AB
Specific Gravity, Urine: 1.013 (ref 1.005–1.030)
WBC, UA: >50 WBC/hpf (ref 0–5)
pH: 8 (ref 5.0–8.0)

## 2022-07-21 LAB — CBC WITH DIFFERENTIAL/PLATELET
Abs Immature Granulocytes: 0.02 10*3/uL (ref 0.00–0.07)
Basophils Absolute: 0.1 10*3/uL (ref 0.0–0.1)
Basophils Relative: 1 %
Eosinophils Absolute: 0.3 10*3/uL (ref 0.0–0.5)
Eosinophils Relative: 3 %
HCT: 30.7 % — ABNORMAL LOW (ref 36.0–46.0)
Hemoglobin: 9.7 g/dL — ABNORMAL LOW (ref 12.0–15.0)
Immature Granulocytes: 0 %
Lymphocytes Relative: 28 %
Lymphs Abs: 2.7 10*3/uL (ref 0.7–4.0)
MCH: 28.5 pg (ref 26.0–34.0)
MCHC: 31.6 g/dL (ref 30.0–36.0)
MCV: 90.3 fL (ref 80.0–100.0)
Monocytes Absolute: 0.6 10*3/uL (ref 0.1–1.0)
Monocytes Relative: 6 %
Neutro Abs: 6 10*3/uL (ref 1.7–7.7)
Neutrophils Relative %: 62 %
Platelets: 323 10*3/uL (ref 150–400)
RBC: 3.4 MIL/uL — ABNORMAL LOW (ref 3.87–5.11)
RDW: 13.5 % (ref 11.5–15.5)
WBC: 9.7 10*3/uL (ref 4.0–10.5)
nRBC: 0 % (ref 0.0–0.2)

## 2022-07-21 LAB — COMPREHENSIVE METABOLIC PANEL
ALT: 8 U/L (ref 0–44)
AST: 14 U/L — ABNORMAL LOW (ref 15–41)
Albumin: 2.8 g/dL — ABNORMAL LOW (ref 3.5–5.0)
Alkaline Phosphatase: 49 U/L (ref 38–126)
Anion gap: 8 (ref 5–15)
BUN: 32 mg/dL — ABNORMAL HIGH (ref 8–23)
CO2: 26 mmol/L (ref 22–32)
Calcium: 8.6 mg/dL — ABNORMAL LOW (ref 8.9–10.3)
Chloride: 101 mmol/L (ref 98–111)
Creatinine, Ser: 1.84 mg/dL — ABNORMAL HIGH (ref 0.44–1.00)
GFR, Estimated: 26 mL/min — ABNORMAL LOW (ref >60)
Glucose, Bld: 287 mg/dL — ABNORMAL HIGH (ref 70–99)
Potassium: 4.2 mmol/L (ref 3.5–5.1)
Sodium: 135 mmol/L (ref 135–145)
Total Bilirubin: 0.4 mg/dL (ref 0.3–1.2)
Total Protein: 7.1 g/dL (ref 6.5–8.1)

## 2022-07-21 LAB — BRAIN NATRIURETIC PEPTIDE: B Natriuretic Peptide: 63 pg/mL (ref 0.0–100.0)

## 2022-07-21 MED ORDER — APIXABAN 5 MG PO TABS
10.0000 mg | ORAL_TABLET | Freq: Once | ORAL | Status: AC
  Administered 2022-07-21: 10 mg via ORAL
  Filled 2022-07-21: qty 2

## 2022-07-21 MED ORDER — APIXABAN (ELIQUIS) EDUCATION KIT FOR DVT/PE PATIENTS: PACK | Freq: Once | Status: AC

## 2022-07-21 MED ORDER — FOSFOMYCIN TROMETHAMINE 3 G PO PACK
3.0000 g | PACK | Freq: Once | ORAL | Status: AC
  Administered 2022-07-21: 3 g via ORAL
  Filled 2022-07-21: qty 3

## 2022-07-21 MED ORDER — APIXABAN (ELIQUIS) VTE STARTER PACK (10MG AND 5MG): ORAL_TABLET | ORAL | 0 refills | Status: DC

## 2022-07-21 NOTE — Discharge Instructions (Addendum)
Information on my medicine - ELIQUIS (apixaban)  This medication education was reviewed with me or my healthcare representative as part of my discharge preparation.    Why was Eliquis prescribed for you? Eliquis was prescribed to treat blood clots that may have been found in the veins of your legs (deep vein thrombosis) or in your lungs (pulmonary embolism) and to reduce the risk of them occurring again.  What do You need to know about Eliquis ? The starting dose is 10 mg (two 5 mg tablets) taken TWICE daily for the FIRST SEVEN (7) DAYS, then on (enter date)  07/28/22  the dose is reduced to ONE 5 mg tablet taken TWICE daily.  Eliquis may be taken with or without food.   Try to take the dose about the same time in the morning and in the evening. If you have difficulty swallowing the tablet whole please discuss with your pharmacist how to take the medication safely.  Take Eliquis exactly as prescribed and DO NOT stop taking Eliquis without talking to the doctor who prescribed the medication.  Stopping may increase your risk of developing a new blood clot.  Refill your prescription before you run out.  After discharge, you should have regular check-up appointments with your healthcare provider that is prescribing your Eliquis.    What do you do if you miss a dose? If a dose of ELIQUIS is not taken at the scheduled time, take it as soon as possible on the same day and twice-daily administration should be resumed. The dose should not be doubled to make up for a missed dose.  Important Safety Information A possible side effect of Eliquis is bleeding. You should call your healthcare provider right away if you experience any of the following: Bleeding from an injury or your nose that does not stop. Unusual colored urine (red or dark brown) or unusual colored stools (red or black). Unusual bruising for unknown reasons. A serious fall or if you hit your head (even if there is no  bleeding).  Some medicines may interact with Eliquis and might increase your risk of bleeding or clotting while on Eliquis. To help avoid this, consult your healthcare provider or pharmacist prior to using any new prescription or non-prescription medications, including herbals, vitamins, non-steroidal anti-inflammatory drugs (NSAIDs) and supplements.  This website has more information on Eliquis (apixaban): http://www.eliquis.com/eliquis/home

## 2022-07-21 NOTE — ED Notes (Signed)
Pt given peanut butter crackers and apple juice

## 2022-07-21 NOTE — ED Triage Notes (Signed)
Pt c/o weakness last several days.  Not feeling like herself.  Left arm swelling.  Pus and foul smelling urine in bag.

## 2022-07-21 NOTE — ED Provider Notes (Signed)
[place] Provider Note   CSN: [id number] Arrival date & time: 07/21/22  1200     History {Add pertinent medical, surgical, social history, OB history to HPI:1} Chief Complaint  Patient presents with   Fatigue    [name] is a 88 y.o. female.  HPI Patient presents via EMS.  History is obtained by those individuals as the patient has dementia, is typically ANO x 1.  Per EMS the patient is at her baseline according to family members who are with the patient prior to transfer.  Patient has a history of multiple medical issues including prior intracranial hemorrhage, heart failure, chronic indwelling catheter.  She presents with family concern of left upper extremity swelling.  The patient self cannot specify any pain, complaints, dyspnea.  EMS reports no hemodynamic instability en route.  Level 5 caveat secondary to dementia.    Home Medications Prior to Admission medications   Medication Sig Start Date End Date Taking? Authorizing Provider  acetaminophen (TYLENOL) 325 MG tablet Take 2 tablets (650 mg total) by mouth every 6 (six) hours as needed for mild pain, fever or headache (or Fever >/= 101). 07/31/19  Yes Emokpae, Courage, MD  amLODipine (NORVASC) 10 MG tablet Take 1 tablet (10 mg total) by mouth daily. 09/29/20  Yes Patrecia Pour, MD  DULoxetine (CYMBALTA) 30 MG capsule Take 1 capsule (30 mg total) by mouth daily. 10/11/20  Yes Angiulli, Lavon Paganini, PA-C  famotidine (PEPCID) 20 MG tablet Take 20 mg by mouth daily. 01/29/21  Yes [provider]  hydrALAZINE (APRESOLINE) 100 MG tablet Take 1 tablet (100 mg total) by mouth 3 (three) times daily. 09/30/20  Yes Patrecia Pour, MD  hydrocortisone (ANUSOL-HC) 2.5 % rectal cream Place 1 application rectally 2 (two) times daily as needed for hemorrhoids or anal itching. 01/03/20  Yes Barton Dubois, MD  insulin aspart (NOVOLOG) 100 UNIT/ML FlexPen Inject 0-10 Units into the skin 3 (three)  times daily with meals. insulin aspart (novoLOG) injection 0-10 Units 0-10 Units Subcutaneous, 3 times daily with meals CBG < 70: Implement Hypoglycemia Standing Orders and refer to Hypoglycemia Standing Orders sidebar report  CBG 70 - 120: 0 unit CBG 121 - 150: 0 unit  CBG 151 - 200: 1 unit CBG 201 - 250: 2 units CBG 251 - 300: 4 units CBG 301 - 350: 6 units  CBG 351 - 400: 8 units  CBG > 400: 10 units 07/31/19  Yes Emokpae, Courage, MD  insulin detemir (LEVEMIR) 100 UNIT/ML injection Inject 0.22 mLs (22 Units total) into the skin 2 (two) times daily. 10/16/20  Yes Dessa Phi, DO  LORazepam (ATIVAN) 0.5 MG tablet Take 0.5 mg by mouth every 4 (four) hours as needed. 06/03/22  Yes [provider]  Morphine Sulfate (MORPHINE CONCENTRATE) 10 mg / 0.5 ml concentrated solution Take 10 mg by mouth See admin instructions. Take 0.25 ml by mouth every 2 hours as needed for pain 07/14/22  Yes [provider]  pantoprazole (PROTONIX) 40 MG tablet Take 1 tablet by mouth daily. 09/06/20  Yes [provider]  prochlorperazine (COMPAZINE) 10 MG tablet Take 10 mg by mouth every 4 (four) hours as needed. 06/29/22  Yes [provider]  promethazine (PHENERGAN) 25 MG tablet Take 12.5-25 mg by mouth every 6 (six) hours as needed. 06/03/22  Yes [provider]  senna-docusate (SENOKOT-S) 8.6-50 MG tablet Take 1 tablet by mouth every other day.   Yes [provider]  gabapentin (NEURONTIN) 100 MG capsule Take 100 mg by mouth at bedtime. Patient not taking: Reported on 07/21/2022 04/21/22   [provider]  torsemide (DEMADEX) 20 MG tablet Take 1 tablet (20 mg total) by mouth 2 (two) times daily. Patient not taking: Reported on 07/21/2022 02/28/21 04/24/21  Deatra James, MD      Allergies    Patient has no known allergies.    Review of Systems   Review of Systems  Unable to perform ROS: Dementia    Physical Exam Updated Vital Signs BP (!) 141/52   Pulse 65    Temp 98.2 F (36.8 C) (Oral)   Resp 19   Ht '5\' 5"'$  (1.651 m)   Wt 105 kg   SpO2 97%   BMI 38.52 kg/m  Physical Exam Vitals and nursing note reviewed.  Constitutional:      General: She is not in acute distress.    Appearance: She is well-developed. She is obese. She is not ill-appearing or diaphoretic.  HENT:     Head: Normocephalic and atraumatic.  Eyes:     Conjunctiva/sclera: Conjunctivae normal.  Cardiovascular:     Rate and Rhythm: Normal rate and regular rhythm.  Pulmonary:     Effort: Pulmonary effort is normal. No respiratory distress.     Breath sounds: Normal breath sounds. No stridor.  Abdominal:     General: There is no distension.  Musculoskeletal:     Comments: Left upper extremity enlarged compared to right.  Pulses are normal, skin color normal, patient does not demonstrate pain with  Skin:    General: Skin is warm and dry.  Neurological:     Mental Status: She is alert.     Motor: Atrophy present.     Comments: Patient awake and alert, answering very briefly to questioning. Atrophy appreciable.  Psychiatric:        Behavior: Behavior is slowed and withdrawn.        Cognition and Memory: Cognition is impaired. Memory is impaired.     ED Results / Procedures / Treatments   Labs (all labs ordered are listed, but only abnormal results are displayed) Labs Reviewed  COMPREHENSIVE METABOLIC PANEL - Abnormal; Notable for the following components:      Result Value   Glucose, Bld 287 (*)    BUN 32 (*)    Creatinine, Ser 1.84 (*)    Calcium 8.6 (*)    Albumin 2.8 (*)    AST 14 (*)    GFR, Estimated 26 (*)    All other components within normal limits  CBC WITH DIFFERENTIAL/PLATELET - Abnormal; Notable for the following components:   RBC 3.40 (*)    Hemoglobin 9.7 (*)    HCT 30.7 (*)    All other components within normal limits  URINALYSIS, ROUTINE W REFLEX MICROSCOPIC - Abnormal; Notable for the following components:   APPearance HAZY (*)     Glucose, UA 50 (*)    Hgb urine dipstick SMALL (*)    Protein, ur 100 (*)    Nitrite POSITIVE (*)    Leukocytes,Ua MODERATE (*)    Bacteria, UA RARE (*)    All other components within normal limits  BRAIN NATRIURETIC PEPTIDE    EKG None  Radiology US Venous Img Upper Left (DVT Study)  Result Date: 07/21/2022 CLINICAL DATA:  Left arm edema EXAM: LEFT UPPER EXTREMITY VENOUS DOPPLER ULTRASOUND TECHNIQUE: Gray-scale sonography with graded compression, as well as color Doppler and duplex ultrasound  were performed to evaluate the upper extremity deep venous system from the level of the subclavian vein and including the jugular, axillary, basilic, radial, ulnar and upper cephalic vein. Spectral Doppler was utilized to evaluate flow at rest and with distal augmentation maneuvers. COMPARISON:  None Available. FINDINGS: Contralateral Subclavian Vein: Respiratory phasicity is normal and symmetric with the symptomatic side. No evidence of thrombus. Normal compressibility. Internal Jugular Vein: No evidence of thrombus. Normal compressibility, respiratory phasicity and response to augmentation. Subclavian Vein: Occlusive echogenic thrombus in the left subclavian vein. Axillary Vein: Partially occlusive thrombus in the left axillary vein. Cephalic Vein: Occlusive thrombus in the left cephalic vein. Basilic Vein: Occlusive thrombus in the left basilic vein. Brachial Veins: No evidence of thrombus. Normal compressibility, respiratory phasicity and response to augmentation. Radial Veins: No evidence of thrombus. Normal compressibility, respiratory phasicity and response to augmentation. Ulnar Veins: No evidence of thrombus. Normal compressibility, respiratory phasicity and response to augmentation. Venous Reflux:  None visualized. Other Findings:  None visualized. IMPRESSION: 1. Positive for left upper extremity DVT with occlusive thrombus in the left subclavian vein and partially occlusive thrombus in the left  axillary vein. 2. Occlusive thrombus in the left cephalic and basilic veins. Electronically Signed   By: Davina Poke D.O.   On: 07/21/2022 14:42    Procedures Procedures  {Document cardiac monitor, telemetry assessment procedure when appropriate:1}  Medications Ordered in ED Medications  fosfomycin (MONUROL) packet 3 g (3 g Oral Given 07/21/22 1522)    ED Course/ Medical Decision Making/ A&P   {   Click here for ABCD2, HEART and other calculatorsREFRESH Note before signing :1}                          Medical Decision Making Patient with multiple medical problems, including hypertension, heart failure, prior intracranial hemorrhage, now presents with left lower extremity swelling.  On exam she is in no distress, but has swelling, concern for DVT versus worsening heart failure or hepatic dysfunction.  Less likely infection given the absence of fever, superficial changes, complaints.  Patient was found to have substantial opacification of her urine, urinalysis sent in addition to labs, ultrasound ordered. Patient placed on continuous cardiac monitoring, pulse oximetry. Cardiac 75 sinus normal Pulse ox 97% room air normal   Amount and/or Complexity of Data Reviewed Independent Historian: EMS Labs: ordered. Decision-making details documented in ED Course. Radiology: ordered and independent interpretation performed. Decision-making details documented in ED Course.  Risk Prescription drug management. Decision regarding hospitalization.   3:26 PM Patient has had 2 different daughters with her at bedside.  Both daughters are aware of the urinary tract infection, with one of them I have discussed the patient's finding subclavian DVT, proximal left upper extremity DVT.  This is complicated by patient's history of intracranial hemorrhage.  Patient is no distress, no loss of neurovascular status distally in the same arm, no other complaints no evidence of bacteremia, sepsis, patient  appropriate   Dr. Matilde Sprang is aware.  {Document critical care time when appropriate:1} {Document review of labs and clinical decision tools ie heart score, Chads2Vasc2 etc:1}  {Document your independent review of radiology images, and any outside records:1} {Document your discussion with family members, caretakers, and with consultants:1} {Document social determinants of health affecting pt's care:1} {Document your decision making why or why not admission, treatments were needed:1} Final Clinical Impression(s) / ED Diagnoses Final diagnoses:  Thrombosis of left subclavian vein (HCC)  Lower urinary tract  infectious disease    Rx / DC Orders ED Discharge Orders     None

## 2022-07-21 NOTE — ED Notes (Signed)
Take home transport called at this time . [name]

## 2022-07-21 NOTE — ED Notes (Signed)
Foley bag changed out.  Very foul smelling urine-wasted.  Awaiting for fresh urine for UA

## 2022-07-21 NOTE — Plan of Care (Signed)
Discussed with Dr. De Burrs, this is an 88 year old patient with a past medical history of atrial fibrillation off anticoagulation since hypertensive hemorrhage in 09/2020, CKD, diabetes, hypertension, hyperlipidemia rectal bleeding (to 2020)  Dementia at baseline, oriented to self only and bedbound with chronic indwelling catheter  She presented with left upper extremity swelling and was found to have multiple DVTs  MS, and on risks of anticoagulation in the setting of her history of ICH in May 2022.  Per review of the chart this was a hypertensive hemorrhage, MRI brain and head CT from the time personally reviewed and I agree with this assessment; her BP pressure at the time with EMS was in the 200s, 167/48 documented on ED provider exam.  I am asked to comment on risks of anticoagulation from a neurological perspective.  From a neurological perspective this prior hemorrhage is not an absolute contraindication to anticoagulation. She does not appear to have had neurology follow-up since; I suspect if she had had further follow-up anticoagulation may have been resumed/recommended earlier.  For secondary stroke prevention from atrial fibrillation as well as given her DVTs, resuming anticoagulation would be reasonable with the caveat that she still would have risk of recurrent ICH.  Her prior MRI does not look concerning for CAA which would greatly increase ICH risk and would contraindicate anticoagulation.  Blood pressure control with goal normotension would be critical to reduce risk of recurrent hypertensive ICH.  I agree with shared decision-making conversation with family taking into account all of her medical comorbidities.   These are brief curbside recommendations focused on only the question asked and based on information provided to me by ED provider as well as brief chart review as documented above.   Lesleigh Noe MD-PhD [facility] [phone] Telecoverage for emergent  consults (code strokes, status) and brief curbside recommendations at [place], [facility], [place] is from 8 AM to to 8 PM by [facility].  For a full routine consults [facility] neurohospitalist should be contacted; information can be found on AMION

## 2022-07-21 NOTE — ED Provider Notes (Signed)
  Physical Exam  BP (!) 141/52   Pulse 65   Temp 98.2 F (36.8 C) (Oral)   Resp 19   Ht '5\' 5"'$  (1.651 m)   Wt 105 kg   SpO2 97%   BMI 38.52 kg/m   Physical Exam Vitals and nursing note reviewed.  Constitutional:      General: She is not in acute distress.    Appearance: She is well-developed.  HENT:     Head: Normocephalic and atraumatic.  Eyes:     Conjunctiva/sclera: Conjunctivae normal.  Cardiovascular:     Rate and Rhythm: Normal rate and regular rhythm.     Heart sounds: No murmur heard. Pulmonary:     Effort: Pulmonary effort is normal. No respiratory distress.     Breath sounds: Normal breath sounds.  Abdominal:     Palpations: Abdomen is soft.     Tenderness: There is no abdominal tenderness.  Musculoskeletal:        General: No swelling.     Cervical back: Neck supple.  Skin:    General: Skin is warm and dry.     Capillary Refill: Capillary refill takes less than 2 seconds.  Neurological:     Mental Status: She is alert.  Psychiatric:        Mood and Affect: Mood normal.     Procedures  Procedures  ED Course / MDM    Medical Decision Making Amount and/or Complexity of Data Reviewed Labs: ordered.  Risk Prescription drug management.   Patient received in handoff.  Upper extremity DVT pending vascular consultation for anticoagulation recommendations given previous history of intraparenchymal hemorrhage.  I did speak with Dr. Trula Slade of vascular surgery who states that patient should be on anticoagulation if possible but he is requesting neurology to weigh in on the intracerebral rebleed right and whether or not this is safe for this patient.  I spoke with Dr. Curly Shores of neurology and it appears patient's previous bleeds were hypertension mediated and now that her blood pressure is under control, she would be safe to restart anticoagulation.  Eliquis initiated here in the ED and patient discharged with outpatient DVT clinic follow-up       Teressa Lower, MD 07/21/22 [phone]

## 2022-07-21 NOTE — ED Notes (Signed)
Trying to arrange transpost home

## 2022-07-21 NOTE — ED Notes (Signed)
Left arm swollen.  Brachial pulses present

## 2022-07-22 ENCOUNTER — Other Ambulatory Visit (HOSPITAL_COMMUNITY): Payer: Self-pay

## 2022-07-31 ENCOUNTER — Encounter (HOSPITAL_COMMUNITY): Payer: Medicare HMO

## 2022-08-03 ENCOUNTER — Telehealth (HOSPITAL_COMMUNITY): Payer: Self-pay

## 2022-08-03 NOTE — Telephone Encounter (Signed)
Attempted to call daughter Aniceto Boss) today to reschedule appointment (08/03/22) at 12pm, no answer.  Left a voicemail

## 2022-08-05 ENCOUNTER — Telehealth (HOSPITAL_COMMUNITY): Payer: Self-pay

## 2022-08-05 NOTE — Telephone Encounter (Signed)
Attempted to reach out to daughter regarding missed appointments, no answer.  Will reopen if patient or caretaker reaches out.

## 2022-08-26 ENCOUNTER — Encounter (HOSPITAL_COMMUNITY): Payer: Self-pay | Admitting: Emergency Medicine

## 2022-08-26 ENCOUNTER — Emergency Department (HOSPITAL_COMMUNITY)
Admission: EM | Admit: 2022-08-26 | Discharge: 2022-08-27 | Disposition: A | Discharge status: Home / Self Care | Payer: Medicare Other | Attending: Emergency Medicine | Admitting: Emergency Medicine

## 2022-08-26 ENCOUNTER — Other Ambulatory Visit: Payer: Self-pay

## 2022-08-26 ENCOUNTER — Emergency Department (HOSPITAL_COMMUNITY): Payer: Medicare Other

## 2022-08-26 DIAGNOSIS — M542 Cervicalgia: Secondary | ICD-10-CM | POA: Diagnosis not present

## 2022-08-26 DIAGNOSIS — I509 Heart failure, unspecified: Secondary | ICD-10-CM | POA: Diagnosis not present

## 2022-08-26 DIAGNOSIS — Z7901 Long term (current) use of anticoagulants: Secondary | ICD-10-CM | POA: Insufficient documentation

## 2022-08-26 DIAGNOSIS — E1165 Type 2 diabetes mellitus with hyperglycemia: Secondary | ICD-10-CM | POA: Diagnosis not present

## 2022-08-26 DIAGNOSIS — D649 Anemia, unspecified: Secondary | ICD-10-CM | POA: Diagnosis not present

## 2022-08-26 DIAGNOSIS — Z79899 Other long term (current) drug therapy: Secondary | ICD-10-CM | POA: Insufficient documentation

## 2022-08-26 DIAGNOSIS — Z794 Long term (current) use of insulin: Secondary | ICD-10-CM | POA: Diagnosis not present

## 2022-08-26 DIAGNOSIS — R519 Headache, unspecified: Secondary | ICD-10-CM | POA: Insufficient documentation

## 2022-08-26 DIAGNOSIS — I10 Essential (primary) hypertension: Secondary | ICD-10-CM | POA: Diagnosis not present

## 2022-08-26 LAB — CBC WITH DIFFERENTIAL/PLATELET
Abs Immature Granulocytes: 0.04 10*3/uL (ref 0.00–0.07)
Basophils Absolute: 0 10*3/uL (ref 0.0–0.1)
Basophils Relative: 0 %
Eosinophils Absolute: 0.2 10*3/uL (ref 0.0–0.5)
Eosinophils Relative: 2 %
HCT: 30.5 % — ABNORMAL LOW (ref 36.0–46.0)
Hemoglobin: 10.3 g/dL — ABNORMAL LOW (ref 12.0–15.0)
Immature Granulocytes: 0 %
Lymphocytes Relative: 30 %
Lymphs Abs: 2.8 10*3/uL (ref 0.7–4.0)
MCH: 29.2 pg (ref 26.0–34.0)
MCHC: 33.8 g/dL (ref 30.0–36.0)
MCV: 86.4 fL (ref 80.0–100.0)
Monocytes Absolute: 0.6 10*3/uL (ref 0.1–1.0)
Monocytes Relative: 6 %
Neutro Abs: 5.9 10*3/uL (ref 1.7–7.7)
Neutrophils Relative %: 62 %
Platelets: 368 10*3/uL (ref 150–400)
RBC: 3.53 MIL/uL — ABNORMAL LOW (ref 3.87–5.11)
RDW: 15 % (ref 11.5–15.5)
WBC: 9.5 10*3/uL (ref 4.0–10.5)
nRBC: 0 % (ref 0.0–0.2)

## 2022-08-26 LAB — COMPREHENSIVE METABOLIC PANEL
ALT: 10 U/L (ref 0–44)
AST: 16 U/L (ref 15–41)
Albumin: 2.9 g/dL — ABNORMAL LOW (ref 3.5–5.0)
Alkaline Phosphatase: 50 U/L (ref 38–126)
Anion gap: 11 (ref 5–15)
BUN: 25 mg/dL — ABNORMAL HIGH (ref 8–23)
CO2: 23 mmol/L (ref 22–32)
Calcium: 8.7 mg/dL — ABNORMAL LOW (ref 8.9–10.3)
Chloride: 99 mmol/L (ref 98–111)
Creatinine, Ser: 1.55 mg/dL — ABNORMAL HIGH (ref 0.44–1.00)
GFR, Estimated: 32 mL/min — ABNORMAL LOW (ref >60)
Glucose, Bld: 218 mg/dL — ABNORMAL HIGH (ref 70–99)
Potassium: 3.8 mmol/L (ref 3.5–5.1)
Sodium: 133 mmol/L — ABNORMAL LOW (ref 135–145)
Total Bilirubin: 0.5 mg/dL (ref 0.3–1.2)
Total Protein: 7.3 g/dL (ref 6.5–8.1)

## 2022-08-26 MED ORDER — IOHEXOL 350 MG/ML SOLN: 75.0000 mL | Freq: Once | INTRAVENOUS | Status: DC | PRN

## 2022-08-26 MED ORDER — HYDROMORPHONE HCL 1 MG/ML IJ SOLN
1.0000 mg | Freq: Once | INTRAMUSCULAR | Status: AC
  Administered 2022-08-26: 1 mg via INTRAMUSCULAR
  Filled 2022-08-26: qty 1

## 2022-08-26 MED ORDER — SODIUM CHLORIDE 0.9 % IV BOLUS
500.0000 mL | Freq: Once | INTRAVENOUS | Status: AC
  Administered 2022-08-26: 500 mL via INTRAVENOUS

## 2022-08-26 MED ORDER — MORPHINE SULFATE (PF) 2 MG/ML IV SOLN
2.0000 mg | Freq: Once | INTRAVENOUS | Status: AC
  Administered 2022-08-26: 2 mg via INTRAVENOUS
  Filled 2022-08-26: qty 1

## 2022-08-26 NOTE — Discharge Instructions (Signed)
Continue using the fentanyl patches and taking the morphine as needed.  Follow-up with your family doctor next week if not improving

## 2022-08-26 NOTE — ED Triage Notes (Signed)
Pt is a hospice c/o neck pain. Pt received fentanyl patch today with morphine and ativan and pain is still not relieved.

## 2022-08-26 NOTE — ED Provider Notes (Signed)
[place] [facility]MENT AT [facility] Provider Note   CSN: [id number] Arrival date & time: 08/26/22  1808     History  Chief Complaint  Patient presents with   Neck Pain    [name] is a 88 y.o. female.  Patient has a history of diabetes, congestive heart failure, kidney disease and previous intracerebral hemorrhage.  She complains of pain to the back of her neck.  Patient takes morphine for pain also has a fentanyl patch  The history is provided by the patient and medical records. No language interpreter was used.  Neck Pain Pain location:  Generalized neck Quality:  Aching Pain severity:  Moderate Pain is:  Worse during the day Timing:  Constant Progression:  Unable to specify Context: not fall   Relieved by:  Nothing Associated symptoms: no chest pain and no headaches        Home Medications Prior to Admission medications   Medication Sig Start Date End Date Taking? Authorizing Provider  acetaminophen (TYLENOL) 325 MG tablet Take 2 tablets (650 mg total) by mouth every 6 (six) hours as needed for mild pain, fever or headache (or Fever >/= 101). 07/31/19  Yes Emokpae, Courage, MD  amLODipine (NORVASC) 10 MG tablet Take 1 tablet (10 mg total) by mouth daily. 09/29/20  Yes Tyrone Nine, MD  DULoxetine (CYMBALTA) 30 MG capsule Take 1 capsule (30 mg total) by mouth daily. 10/11/20  Yes Angiulli, Mcarthur Rossetti, PA-C  ELIQUIS 5 MG TABS tablet Take 5 mg by mouth 2 (two) times daily. 08/26/22  Yes [provider]  fentaNYL (DURAGESIC) 12 MCG/HR 1 patch every 3 (three) days. 08/17/22  Yes [provider]  hydrALAZINE (APRESOLINE) 100 MG tablet Take 1 tablet (100 mg total) by mouth 3 (three) times daily. 09/30/20  Yes Tyrone Nine, MD  insulin aspart (NOVOLOG) 100 UNIT/ML FlexPen Inject 0-10 Units into the skin 3 (three) times daily with meals. insulin aspart (novoLOG) injection 0-10 Units 0-10 Units Subcutaneous, 3 times daily with meals CBG < 70:  Implement Hypoglycemia Standing Orders and refer to Hypoglycemia Standing Orders sidebar report  CBG 70 - 120: 0 unit CBG 121 - 150: 0 unit  CBG 151 - 200: 1 unit CBG 201 - 250: 2 units CBG 251 - 300: 4 units CBG 301 - 350: 6 units  CBG 351 - 400: 8 units  CBG > 400: 10 units 07/31/19  Yes Emokpae, Courage, MD  LANTUS SOLOSTAR 100 UNIT/ML Solostar Pen Inject 22 Units into the skin 2 (two) times daily. 08/12/22  Yes [provider]  LORazepam (ATIVAN) 0.5 MG tablet Take 0.5 mg by mouth every 4 (four) hours as needed. 06/03/22  Yes [provider]  Morphine Sulfate (MORPHINE CONCENTRATE) 10 mg / 0.5 ml concentrated solution Take 10 mg by mouth See admin instructions. Take 0.25 ml by mouth every 2 hours as needed for pain 07/14/22  Yes [provider]  prochlorperazine (COMPAZINE) 10 MG tablet Take 10 mg by mouth every 4 (four) hours as needed. 06/29/22  Yes [provider]  promethazine (PHENERGAN) 25 MG tablet Take 12.5-25 mg by mouth every 6 (six) hours as needed. 06/03/22  Yes [provider]  senna-docusate (SENOKOT-S) 8.6-50 MG tablet Take 1 tablet by mouth daily.   Yes [provider]  tamoxifen (NOLVADEX) 20 MG tablet Take 20 mg by mouth daily.   Yes [provider]      Allergies    Patient has no  known allergies.    Review of Systems   Review of Systems  Constitutional:  Negative for appetite change and fatigue.  HENT:  Negative for congestion, ear discharge and sinus pressure.   Eyes:  Negative for discharge.  Respiratory:  Negative for cough.   Cardiovascular:  Negative for chest pain.  Gastrointestinal:  Negative for abdominal pain and diarrhea.  Genitourinary:  Negative for frequency and hematuria.  Musculoskeletal:  Positive for neck pain. Negative for back pain.       Neck pain  Skin:  Negative for rash.  Neurological:  Negative for seizures and headaches.  Psychiatric/Behavioral:  Negative for hallucinations.      Physical Exam Updated Vital Signs BP (!) 145/57   Pulse 71   Temp 97.8 F (36.6 C) (Oral)   Resp 20   Ht 5\' 5"  (1.651 m)   Wt 105 kg   SpO2 94%   BMI 38.52 kg/m  Physical Exam Vitals and nursing note reviewed.  Constitutional:      Appearance: She is well-developed.  HENT:     Head: Normocephalic.     Nose: Nose normal.  Eyes:     General: No scleral icterus.    Conjunctiva/sclera: Conjunctivae normal.  Neck:     Thyroid: No thyromegaly.  Cardiovascular:     Rate and Rhythm: Normal rate and regular rhythm.     Heart sounds: No murmur heard.    No friction rub. No gallop.  Pulmonary:     Breath sounds: No stridor. No wheezing or rales.  Chest:     Chest wall: No tenderness.  Abdominal:     General: There is no distension.     Tenderness: There is no abdominal tenderness. There is no rebound.  Musculoskeletal:        General: Normal range of motion.     Cervical back: Neck supple.     Comments: Tender posterior neck  Lymphadenopathy:     Cervical: No cervical adenopathy.  Skin:    Findings: No erythema or rash.  Neurological:     Mental Status: She is alert and oriented to person, place, and time.     Motor: No abnormal muscle tone.     Coordination: Coordination normal.  Psychiatric:        Behavior: Behavior normal.     ED Results / Procedures / Treatments   Labs (all labs ordered are listed, but only abnormal results are displayed) Labs Reviewed  CBC WITH DIFFERENTIAL/PLATELET - Abnormal; Notable for the following components:      Result Value   RBC 3.53 (*)    Hemoglobin 10.3 (*)    HCT 30.5 (*)    All other components within normal limits  COMPREHENSIVE METABOLIC PANEL - Abnormal; Notable for the following components:   Sodium 133 (*)    Glucose, Bld 218 (*)    BUN 25 (*)    Creatinine, Ser 1.55 (*)    Calcium 8.7 (*)    Albumin 2.9 (*)    GFR, Estimated 32 (*)    All other components within normal limits    EKG None  Radiology CT  Cervical Spine Wo Contrast  Result Date: 08/26/2022 CLINICAL DATA:  Neck pain. EXAM: CT CERVICAL SPINE WITHOUT CONTRAST TECHNIQUE: Multidetector CT imaging of the cervical spine was performed without intravenous contrast. Multiplanar CT image reconstructions were also generated. RADIATION DOSE REDUCTION: This exam was performed according to the departmental dose-optimization program which includes automated exposure control, adjustment of the mA and/or  kV according to patient size and/or use of iterative reconstruction technique. COMPARISON:  None Available. FINDINGS: Alignment: Normal. Skull base and vertebrae: The bones are diffusely osteopenic. Posterior elements and vertebral body fusion is seen at C2-C3 which is likely congenital. There is no acute fracture or focal osseous lesion. Soft tissues and spinal canal: No prevertebral fluid or swelling. No visible canal hematoma. Disc levels: There is disc space narrowing and endplate osteophyte formation at C5-C6 and C6-C7 compatible with degenerative change. Bilateral facet arthropathy at C3-C4 and C4-C5 causes mild-to-moderate bilateral neural foraminal stenosis. No severe central canal stenosis at any level. Upper chest: Negative. Other: Thyroid gland is diffusely heterogeneous, enlarged with numerous nodules, some nodules are hyperdense and some are hypodense. Largest nodule on the right measures 3.5 cm. Largest nodule on the left measures 1.6 cm. IMPRESSION: 1. No acute fracture or traumatic subluxation of the cervical spine. 2. Multilevel degenerative changes with mild-to-moderate bilateral neural foraminal stenosis at C3-C4 and C4-C5. No severe central canal stenosis at any level. 3. Incidental right thyroid nodule with heterogeneous and enlarged thyroid measuring 3.5 cm. Recommend non-emergent thyroid ultrasound if clinically warranted given patient age. Reference: J Am Coll Radiol. 2015 Feb;12(2): 143-50 Electronically Signed   By: Darliss CheneyAmy  Guttmann M.D.   On:  08/26/2022 22:55   CT Head Wo Contrast  Result Date: 08/26/2022 CLINICAL DATA:  Headaches EXAM: CT HEAD WITHOUT CONTRAST TECHNIQUE: Contiguous axial images were obtained from the base of the skull through the vertex without intravenous contrast. RADIATION DOSE REDUCTION: This exam was performed according to the departmental dose-optimization program which includes automated exposure control, adjustment of the mA and/or kV according to patient size and/or use of iterative reconstruction technique. COMPARISON:  04/25/2021 FINDINGS: Brain: No acute intracranial findings are seen. There are no signs of bleeding within the cranium. Ventricles are not dilated. Cortical sulci are prominent. There is decreased density in periventricular and subcortical white matter. There is possible old lacunar infarct in left basal ganglia. Vascular: Arterial calcifications are seen. Skull: No acute findings are seen. Sinuses/Orbits: Unremarkable. Other: There is increased amount of CSF in sella suggesting partial empty sella. IMPRESSION: No acute intracranial findings are seen. Atrophy. Small-vessel disease. Electronically Signed   By: Ernie AvenaPalani  Rathinasamy M.D.   On: 08/26/2022 19:56    Procedures Procedures    Medications Ordered in ED Medications  iohexol (OMNIPAQUE) 350 MG/ML injection 75 mL (has no administration in time range)  sodium chloride 0.9 % bolus 500 mL (0 mLs Intravenous Stopped 08/26/22 2213)  morphine (PF) 2 MG/ML injection 2 mg (2 mg Intravenous Given 08/26/22 1939)  morphine (PF) 2 MG/ML injection 2 mg (2 mg Intravenous Given 08/26/22 2046)  HYDROmorphone (DILAUDID) injection 1 mg (1 mg Intramuscular Given 08/26/22 2211)    ED Course/ Medical Decision Making/ A&P                             Medical Decision Making Amount and/or Complexity of Data Reviewed Labs: ordered. Radiology: ordered.  Risk Prescription drug management.  This patient presents to the ED for concern of neck pain, this  involves an extensive number of treatment options, and is a complaint that carries with it a high risk of complications and morbidity.  The differential diagnosis includes exacerbation of musculoskeletal pain.  Intracerebral hemorrhage   Co morbidities that complicate the patient evaluation  Diabetes congestive heart failure and previous intracerebral hemorrhage   Additional history obtained:  Additional history  obtained from family External records from outside source obtained and reviewed including hospital records   Lab Tests:  I Ordered, and personally interpreted labs.  The pertinent results include: Hemoglobin 10.5 chemistries show glucose 218   Imaging Studies ordered:  I ordered imaging studies including CT head I independently visualized and interpreted imaging which showed no acute disease I agree with the radiologist interpretation   Cardiac Monitoring: / EKG:  The patient was maintained on a cardiac monitor.  I personally viewed and interpreted the cardiac monitored which showed an underlying rhythm of: Normal sinus rhythm   Consultations Obtained:  No consultant  Problem List / ED Course / Critical interventions / Medication management  Congestive heart failure, neck pain, cerebral hemorrhage I ordered medication including Dilaudid for pain Reevaluation of the patient after these medicines showed that the patient improved I have reviewed the patients home medicines and have made adjustments as needed   Social Determinants of Health:  None   Test / Admission - Considered:  None  Patient with exacerbation of chronic neck pain.  CT head and cervical spine under markable.  Patient will continue taking her morphine and fentanyl patch and follow-up with her PCP        Final Clinical Impression(s) / ED Diagnoses Final diagnoses:  Neck pain    Rx / DC Orders ED Discharge Orders     None         Bethann Berkshire, MD 08/28/22 1247

## 2022-08-27 DIAGNOSIS — Z7401 Bed confinement status: Secondary | ICD-10-CM | POA: Diagnosis not present

## 2022-08-27 DIAGNOSIS — R110 Nausea: Secondary | ICD-10-CM | POA: Diagnosis not present

## 2022-08-27 DIAGNOSIS — M542 Cervicalgia: Secondary | ICD-10-CM | POA: Diagnosis not present

## 2022-08-27 DIAGNOSIS — I10 Essential (primary) hypertension: Secondary | ICD-10-CM | POA: Diagnosis not present

## 2022-08-27 MED ORDER — HYDROMORPHONE HCL 1 MG/ML IJ SOLN: 1.0000 mg | Freq: Once | INTRAMUSCULAR | Status: DC

## 2022-08-27 MED ORDER — HYDROMORPHONE HCL 1 MG/ML IJ SOLN
0.5000 mg | Freq: Once | INTRAMUSCULAR | Status: AC
  Administered 2022-08-27: 0.5 mg via INTRAMUSCULAR
  Filled 2022-08-27: qty 0.5

## 2022-09-02 ENCOUNTER — Telehealth: Payer: Self-pay | Admitting: *Deleted

## 2022-09-02 NOTE — Telephone Encounter (Signed)
        Patient  visited [place] on 08/27/2022  for treatment   Telephone encounter attempt : 1st   A HIPAA compliant voice message was left requesting a return call.  Instructed patient to call back at [phone].  Yehuda Mao Greenauer -Berneda Rose [facility] [place], Population Health [phone] [address] [place] , [place] [address] Email : [email or web address]

## 2023-02-16 DEATH — deceased

## 2024-01-07 ENCOUNTER — Encounter: Payer: Self-pay | Admitting: Radiology

## 2024-03-20 ENCOUNTER — Encounter: Payer: Self-pay | Admitting: Radiology
